# Patient Record
Sex: Female | Born: 1935
Health system: Southern US, Community
[De-identification: ages and names within clinical notes are randomized; demographics above are authoritative.]

## PROBLEM LIST (undated history)

## (undated) DIAGNOSIS — M1990 Unspecified osteoarthritis, unspecified site: Secondary | ICD-10-CM

## (undated) DIAGNOSIS — I480 Paroxysmal atrial fibrillation: Secondary | ICD-10-CM

## (undated) DIAGNOSIS — I10 Essential (primary) hypertension: Secondary | ICD-10-CM

## (undated) DIAGNOSIS — E785 Hyperlipidemia, unspecified: Secondary | ICD-10-CM

## (undated) DIAGNOSIS — J45909 Unspecified asthma, uncomplicated: Secondary | ICD-10-CM

## (undated) DIAGNOSIS — E119 Type 2 diabetes mellitus without complications: Secondary | ICD-10-CM

## (undated) DIAGNOSIS — J449 Chronic obstructive pulmonary disease, unspecified: Secondary | ICD-10-CM

## (undated) DIAGNOSIS — E782 Mixed hyperlipidemia: Secondary | ICD-10-CM

## (undated) DIAGNOSIS — N189 Chronic kidney disease, unspecified: Secondary | ICD-10-CM

## (undated) DIAGNOSIS — D649 Anemia, unspecified: Secondary | ICD-10-CM

## (undated) DIAGNOSIS — R011 Cardiac murmur, unspecified: Secondary | ICD-10-CM

## (undated) DIAGNOSIS — I350 Nonrheumatic aortic (valve) stenosis: Secondary | ICD-10-CM

## (undated) DIAGNOSIS — E7800 Pure hypercholesterolemia, unspecified: Secondary | ICD-10-CM

## (undated) HISTORY — PX: REPAIR KNEE LIGAMENT: SUR1188

## (undated) HISTORY — DX: Nonrheumatic aortic (valve) stenosis: I35.0

## (undated) HISTORY — DX: Paroxysmal atrial fibrillation: I48.0

## (undated) HISTORY — PX: BACK SURGERY: SHX140

## (undated) HISTORY — DX: Chronic kidney disease, unspecified: N18.9

## (undated) HISTORY — DX: Type 2 diabetes mellitus without complications: E11.9

## (undated) HISTORY — DX: Anemia, unspecified: D64.9

## (undated) HISTORY — PX: CYST REMOVAL HAND: SHX6279

## (undated) HISTORY — DX: Essential (primary) hypertension: I10

## (undated) HISTORY — DX: Mixed hyperlipidemia: E78.2

## (undated) HISTORY — PX: APPENDECTOMY: SHX54

---

## 2011-07-06 ENCOUNTER — Encounter (HOSPITAL_COMMUNITY): Payer: Self-pay

## 2011-07-06 ENCOUNTER — Emergency Department (HOSPITAL_COMMUNITY)
Admission: EM | Admit: 2011-07-06 | Discharge: 2011-07-06 | Disposition: A | Discharge status: Home / Self Care | Payer: Medicare Other | Attending: Emergency Medicine | Admitting: Emergency Medicine

## 2011-07-06 ENCOUNTER — Emergency Department (HOSPITAL_COMMUNITY): Payer: Medicare Other

## 2011-07-06 DIAGNOSIS — Z794 Long term (current) use of insulin: Secondary | ICD-10-CM | POA: Insufficient documentation

## 2011-07-06 DIAGNOSIS — R0602 Shortness of breath: Secondary | ICD-10-CM | POA: Insufficient documentation

## 2011-07-06 DIAGNOSIS — E119 Type 2 diabetes mellitus without complications: Secondary | ICD-10-CM | POA: Insufficient documentation

## 2011-07-06 DIAGNOSIS — Z79899 Other long term (current) drug therapy: Secondary | ICD-10-CM | POA: Insufficient documentation

## 2011-07-06 DIAGNOSIS — J45909 Unspecified asthma, uncomplicated: Secondary | ICD-10-CM | POA: Insufficient documentation

## 2011-07-06 DIAGNOSIS — R0789 Other chest pain: Secondary | ICD-10-CM | POA: Insufficient documentation

## 2011-07-06 DIAGNOSIS — I10 Essential (primary) hypertension: Secondary | ICD-10-CM | POA: Insufficient documentation

## 2011-07-06 HISTORY — DX: Unspecified asthma, uncomplicated: J45.909

## 2011-07-06 HISTORY — DX: Essential (primary) hypertension: I10

## 2011-07-06 LAB — DIFFERENTIAL
Basophils Absolute: 0 10*3/uL (ref 0.0–0.1)
Basophils Relative: 0 % (ref 0–1)
Eosinophils Absolute: 0 10*3/uL (ref 0.0–0.7)
Eosinophils Relative: 0 % (ref 0–5)
Lymphocytes Relative: 6 % — ABNORMAL LOW (ref 12–46)
Lymphs Abs: 0.7 10*3/uL (ref 0.7–4.0)
Monocytes Absolute: 0.3 10*3/uL (ref 0.1–1.0)
Monocytes Relative: 3 % (ref 3–12)
Neutro Abs: 10.5 10*3/uL — ABNORMAL HIGH (ref 1.7–7.7)
Neutrophils Relative %: 92 % — ABNORMAL HIGH (ref 43–77)

## 2011-07-06 LAB — CBC
HCT: 36 % (ref 36.0–46.0)
Hemoglobin: 11.3 g/dL — ABNORMAL LOW (ref 12.0–15.0)
MCH: 25.6 pg — ABNORMAL LOW (ref 26.0–34.0)
MCHC: 31.4 g/dL (ref 30.0–36.0)
MCV: 81.6 fL (ref 78.0–100.0)
Platelets: 258 10*3/uL (ref 150–400)
RBC: 4.41 MIL/uL (ref 3.87–5.11)
RDW: 15 % (ref 11.5–15.5)
WBC: 11.4 10*3/uL — ABNORMAL HIGH (ref 4.0–10.5)

## 2011-07-06 LAB — COMPREHENSIVE METABOLIC PANEL
ALT: 12 U/L (ref 0–35)
AST: 18 U/L (ref 0–37)
Albumin: 3.6 g/dL (ref 3.5–5.2)
Alkaline Phosphatase: 139 U/L — ABNORMAL HIGH (ref 39–117)
BUN: 24 mg/dL — ABNORMAL HIGH (ref 6–23)
CO2: 24 mEq/L (ref 19–32)
Calcium: 10.5 mg/dL (ref 8.4–10.5)
Chloride: 95 mEq/L — ABNORMAL LOW (ref 96–112)
Creatinine, Ser: 0.9 mg/dL (ref 0.50–1.10)
GFR calc Af Amer: 72 mL/min — ABNORMAL LOW (ref >90)
GFR calc non Af Amer: 62 mL/min — ABNORMAL LOW (ref >90)
Glucose, Bld: 292 mg/dL — ABNORMAL HIGH (ref 70–99)
Potassium: 5.4 mEq/L — ABNORMAL HIGH (ref 3.5–5.1)
Sodium: 131 mEq/L — ABNORMAL LOW (ref 135–145)
Total Bilirubin: 0.3 mg/dL (ref 0.3–1.2)
Total Protein: 7.4 g/dL (ref 6.0–8.3)

## 2011-07-06 LAB — PRO B NATRIURETIC PEPTIDE: Pro B Natriuretic peptide (BNP): 125.3 pg/mL — ABNORMAL HIGH (ref 0–125)

## 2011-07-06 MED ORDER — MOXIFLOXACIN HCL IN NACL 400 MG/250ML IV SOLN
400.0000 mg | Freq: Once | INTRAVENOUS | Status: AC
  Administered 2011-07-06: 400 mg via INTRAVENOUS
  Filled 2011-07-06: qty 250

## 2011-07-06 MED ORDER — DEXTROSE 5 % IV SOLN: 500.0000 mg | Freq: Once | INTRAVENOUS | Status: DC

## 2011-07-06 MED ORDER — SODIUM CHLORIDE 0.9 % IV SOLN
INTRAVENOUS | Status: DC
  Administered 2011-07-06: 19:00:00 via INTRAVENOUS

## 2011-07-06 MED ORDER — IPRATROPIUM BROMIDE 0.02 % IN SOLN
0.5000 mg | Freq: Once | RESPIRATORY_TRACT | Status: AC
  Administered 2011-07-06: 0.5 mg via RESPIRATORY_TRACT
  Filled 2011-07-06: qty 2.5

## 2011-07-06 MED ORDER — METHYLPREDNISOLONE SODIUM SUCC 125 MG IJ SOLR
125.0000 mg | Freq: Once | INTRAMUSCULAR | Status: AC
  Administered 2011-07-06: 125 mg via INTRAVENOUS
  Filled 2011-07-06: qty 2

## 2011-07-06 MED ORDER — DEXTROSE 5 % IV SOLN: 1.0000 g | Freq: Once | INTRAVENOUS | Status: DC

## 2011-07-06 MED ORDER — ALBUTEROL (5 MG/ML) CONTINUOUS INHALATION SOLN
10.0000 mg/h | INHALATION_SOLUTION | RESPIRATORY_TRACT | Status: AC
  Administered 2011-07-06: 10 mg/h via RESPIRATORY_TRACT
  Filled 2011-07-06: qty 20

## 2011-07-06 MED ORDER — LEVOFLOXACIN 500 MG PO TABS: 500.0000 mg | ORAL_TABLET | Freq: Every day | ORAL | Status: AC

## 2011-07-06 NOTE — ED Notes (Signed)
Pt c/o SOB since THursday, saw pcp yesterday.  Was started on prednisone and was given an antibiotic shot in the office.   Pt says is not feeling any better.  Denies fever.

## 2011-07-06 NOTE — ED Notes (Signed)
Ambulated pt around nurses station; O2 stayed between 90 and 92 while walking.

## 2011-07-06 NOTE — ED Provider Notes (Signed)
History     CSN: [id number]  Arrival date & time 07/06/11  1715   First MD Initiated Contact with Patient 07/06/11 1804      Chief Complaint  Patient presents with  . Shortness of Breath    (Consider location/radiation/quality/duration/timing/severity/associated sxs/prior treatment) HPI  Patient relates she has a history of asthma. She reports 5 days ago she started having cough with yellow sputum, and sometimes has a sore throat and sometimes has some rhinorrhea. She denies chest pain but states sometimes her chest feels tight. She relates she's having wheezing and only gets temporary relief with her inhaler. She denies nausea, vomiting, or diarrhea. She received by her family physician yesterday and got a shot and placed on prednisone and was told that she needed to be admitted. Patient relates she's not improving. She reports she has never been admitted to the hospital for asthma, her daughter does state she was admitted for pneumonia once many years ago in [place]. She has not been around anybody else is sick.  PCP Dr Ninfa Linden in [place]  Past Medical History  Diagnosis Date  . Asthma   . Diabetes mellitus   . Hypertension     History reviewed. No pertinent past surgical history.  No family history on file.  History  Substance Use Topics  . Smoking status: Former Games developer  . Smokeless tobacco: Not on file  . Alcohol Use: No  lives with granddaughter and her great grandchildren  OB History    Grav Para Term Preterm Abortions TAB SAB Ect Mult Living                  Review of Systems  All other systems reviewed and are negative.    Allergies  Amoxicillin and Penicillins  Home Medications   Current Outpatient Rx  Name Route Sig Dispense Refill  . ALBUTEROL SULFATE (2.5 MG/3ML) 0.083% IN NEBU Nebulization Take 2.5 mg by nebulization daily as needed.    . ASPIRIN 325 MG PO TABS Oral Take 325 mg by mouth daily.    Marland Kitchen GLIPIZIDE 5 MG PO TABS Oral Take  2.5 mg by mouth 2 (two) times daily. Take at breakfast and supper    . INSULIN GLARGINE 100 UNIT/ML [place] SOLN Subcutaneous Inject 40 Units into the skin at bedtime.    Marland Kitchen LISINOPRIL 20 MG PO TABS Oral Take 40 mg by mouth daily.    Marland Kitchen METFORMIN HCL 1000 MG PO TABS Oral Take 1,000 mg by mouth 2 (two) times daily.    Marland Kitchen PREDNISONE 20 MG PO TABS Oral Take 20 mg by mouth daily. Take three tablets (60mg ) daily for 3 days, then take two tablets (40mg ) daily for 3 days, then take one tablet (20mg ) daily for 3 days.    Marland Kitchen SIMVASTATIN 80 MG PO TABS Oral Take 80 mg by mouth at bedtime.    . TRIAMTERENE-HCTZ 37.5-25 MG PO TABS Oral Take 1 tablet by mouth daily.      BP 121/60  Pulse 88  Temp 99 F (37.2 C) (Oral)  Resp 24  Ht 5\' 6"  (1.676 m)  Wt 213 lb (96.616 kg)  BMI 34.38 kg/m2  SpO2 94%  Vital signs normal    Physical Exam  Nursing note and vitals reviewed. Constitutional: She is oriented to person, place, and time. She appears well-developed and well-nourished.  Non-toxic appearance. She does not appear ill. No distress.  HENT:  Head: Normocephalic and atraumatic.  Right Ear: External ear normal.  Left Ear:  External ear normal.  Nose: Nose normal. No mucosal edema or rhinorrhea.  Mouth/Throat: Oropharynx is clear and moist and mucous membranes are normal. No dental abscesses or uvula swelling.  Eyes: Conjunctivae and EOM are normal. Pupils are equal, round, and reactive to light.  Neck: Normal range of motion and full passive range of motion without pain. Neck supple.  Cardiovascular: Normal rate, regular rhythm and normal heart sounds.  Exam reveals no gallop and no friction rub.   No murmur heard. Pulmonary/Chest: Effort normal. No respiratory distress. She has wheezes. She has no rhonchi. She has no rales. She exhibits no tenderness and no crepitus.       Patient has diffuse expiratory wheezes in all lung fields, she has mild retractions, she appears to be tachypneic when she talks    Abdominal: Soft. Normal appearance and bowel sounds are normal. She exhibits no distension. There is no tenderness. There is no rebound and no guarding.  Musculoskeletal: Normal range of motion. She exhibits no edema and no tenderness.       Moves all extremities well.   Neurological: She is alert and oriented to person, place, and time. She has normal strength. No cranial nerve deficit.  Skin: Skin is warm, dry and intact. No rash noted. No erythema. No pallor.  Psychiatric: She has a normal mood and affect. Her speech is normal and behavior is normal. Her mood appears not anxious.    ED Course  Procedures (including critical care time)   Medications  0.9 %  sodium chloride infusion (  Intravenous New Bag/Given 07/06/11 1917)  albuterol (PROVENTIL,VENTOLIN) solution continuous neb (0 mg/hr Nebulization Stopped 07/06/11 2140)  ipratropium (ATROVENT) nebulizer solution 0.5 mg (0.5 mg Nebulization Given 07/06/11 2008)  methylPREDNISolone sodium succinate (SOLU-MEDROL) 125 mg/2 mL injection 125 mg (125 mg Intravenous Given 07/06/11 1918)  moxifloxacin (AVELOX) IVPB 400 mg (0 mg Intravenous Stopped 07/06/11 2030)  Albuterol continuous nebulizer  Recheck after nebulizer finished, pt states she is feeling better. Her lungs are now clear. Her pulse ox was 93% on RA.   Pt ambulated by nursing staff and her pulse ox was 90-92 % on RA.  22:36 Pt states she feels fine after her walk, feels ready to go home.    Results for orders placed during the hospital encounter of 07/06/11  CBC      Component Value Range   WBC 11.4 (*) 4.0 - 10.5 K/uL   RBC 4.41  3.87 - 5.11 MIL/uL   Hemoglobin 11.3 (*) 12.0 - 15.0 g/dL   HCT 95.2  84.1 - 32.4 %   MCV 81.6  78.0 - 100.0 fL   MCH 25.6 (*) 26.0 - 34.0 pg   MCHC 31.4  30.0 - 36.0 g/dL   RDW 40.1  02.7 - 25.3 %   Platelets 258  150 - 400 K/uL  DIFFERENTIAL      Component Value Range   Neutrophils Relative 92 (*) 43 - 77 %   Neutro Abs 10.5 (*) 1.7 - 7.7  K/uL   Lymphocytes Relative 6 (*) 12 - 46 %   Lymphs Abs 0.7  0.7 - 4.0 K/uL   Monocytes Relative 3  3 - 12 %   Monocytes Absolute 0.3  0.1 - 1.0 K/uL   Eosinophils Relative 0  0 - 5 %   Eosinophils Absolute 0.0  0.0 - 0.7 K/uL   Basophils Relative 0  0 - 1 %   Basophils Absolute 0.0  0.0 - 0.1 K/uL  COMPREHENSIVE METABOLIC PANEL      Component Value Range   Sodium 131 (*) 135 - 145 mEq/L   Potassium 5.4 (*) 3.5 - 5.1 mEq/L   Chloride 95 (*) 96 - 112 mEq/L   CO2 24  19 - 32 mEq/L   Glucose, Bld 292 (*) 70 - 99 mg/dL   BUN 24 (*) 6 - 23 mg/dL   Creatinine, Ser 4.09  0.50 - 1.10 mg/dL   Calcium 81.1  8.4 - 91.4 mg/dL   Total Protein 7.4  6.0 - 8.3 g/dL   Albumin 3.6  3.5 - 5.2 g/dL   AST 18  0 - 37 U/L   ALT 12  0 - 35 U/L   Alkaline Phosphatase 139 (*) 39 - 117 U/L   Total Bilirubin 0.3  0.3 - 1.2 mg/dL   GFR calc non Af Amer 62 (*) >90 mL/min   GFR calc Af Amer 72 (*) >90 mL/min  PRO B NATRIURETIC PEPTIDE      Component Value Range   Pro B Natriuretic peptide (BNP) 125.3 (*) 0 - 125 pg/mL   Laboratory interpretation all normal except hyperglycemia, renal insufficiency, mild anemia, mild leukocytosis, mild hyponatremia   Dg Chest 2 View  07/06/2011  *RADIOLOGY REPORT*  Clinical Data: Shortness of breath  CHEST - 2 VIEW  Comparison: None.  Findings: Borderline cardiomegaly noted.  No acute infiltrate or pulmonary edema.  There is a pleural based ovoid nodular density in the right upper lobe laterally.  Further evaluation with CT scan of the chest with IV contrast is recommended.  IMPRESSION: Borderline cardiomegaly.There is a pleural based ovoid nodular density in the right upper lobe laterally.  Further evaluation with CT scan of the chest with IV contrast is recommended.  Original Report Authenticated By: Natasha Mead, M.D.     1. Asthma with bronchitis    New Prescriptions   LEVOFLOXACIN (LEVAQUIN) 500 MG TABLET    Take 1 tablet (500 mg total) by mouth daily.    Plan  discharge  Devoria Albe, MD, FACEP    MDM          Ward Givens, MD 07/06/11 2236

## 2011-07-06 NOTE — Discharge Instructions (Signed)
Drink plenty of fluids. Use your nebulizer or inhaler every 4 hrs as needed for wheezing or shortness of breath. Finish the prednisone. Start the antibiotic tomorrow afternoon (you got the first dose in the ED tonight).  Return if you get a high fever, struggle to breathe or feel worse.

## 2012-02-15 ENCOUNTER — Encounter: Payer: Self-pay | Admitting: Orthopedic Surgery

## 2012-02-15 ENCOUNTER — Ambulatory Visit (INDEPENDENT_AMBULATORY_CARE_PROVIDER_SITE_OTHER): Payer: Medicare Other | Admitting: Orthopedic Surgery

## 2012-02-15 ENCOUNTER — Ambulatory Visit (INDEPENDENT_AMBULATORY_CARE_PROVIDER_SITE_OTHER): Payer: Medicare Other

## 2012-02-15 VITALS — BP 130/68 | Ht 67.0 in | Wt 207.2 lb

## 2012-02-15 DIAGNOSIS — M25569 Pain in unspecified knee: Secondary | ICD-10-CM

## 2012-02-15 NOTE — Progress Notes (Signed)
Patient ID: Stephanie [name], female   DOB: [birth year], 77 y.o.   MRN: [id number] Chief Complaint  Patient presents with  . Knee Pain    Right knee pain no injury      This patient is from [place] referred to [place]. She complains of pain in her RIGHT knee since December of 2013 with no acute trauma although she notices were noticed that when she was walking in the supermarket she felt medial knee pain and a pop and since that, time. She's had 8/10 sharp throbbing intermittent pain, worse with standing for long periods and walking. She also has catching and locking and swelling of the knee.  She complains of a cough at times, itching muscle pain, stiffness, joint pain. Review of systems otherwise normal. She has hypertension and diabetes.  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  Physical Exam  Nursing note and vitals reviewed. Constitutional: She is oriented to person, place, and time. She appears well-developed and well-nourished. No distress.  HENT:  Head: Normocephalic.  Eyes: Pupils are equal, round, and reactive to light.  Neck: Normal range of motion.  Cardiovascular: Normal rate and intact distal pulses.   Pulmonary/Chest: Effort normal.  Abdominal: Soft.  Musculoskeletal:       Right knee: She exhibits abnormal alignment, bony tenderness and abnormal meniscus. She exhibits normal range of motion, no swelling, no effusion, no ecchymosis, no deformity, no laceration, no LCL laxity, normal patellar mobility and no MCL laxity. tenderness found. Medial joint line tenderness noted. No lateral joint line, no MCL, no LCL and no patellar tendon tenderness noted.       Left knee: Normal.       Upper extremity exam  The right and left upper extremity:  Inspection and palpation revealed no abnormalities in the upper extremities.  Range of motion is full without contracture. Motor exam is normal with grade 5 strength. The joints are fully reduced without subluxation. There is no atrophy or  tremor and muscle tone is normal.  All joints are stable.      Lymphadenopathy:    She has no cervical adenopathy.  Neurological: She is alert and oriented to person, place, and time. She has normal reflexes.  Skin: Skin is warm and dry. No rash noted. She is not diaphoretic. No erythema. No pallor.  Psychiatric: She has a normal mood and affect. Her behavior is normal. Judgment and thought content normal.   X-ray was obtained the patient is mild to moderate degenerative arthritis  Her clinical exam suggests she has a medial meniscal tear  We've recommended that she have meniscectomy for meniscal tear  She agrees that she has have something done, so will proceed with arthroscopy right knee partial medial meniscectomy

## 2012-02-15 NOTE — Patient Instructions (Addendum)
Surgery right knee: FEB 10TH   Arthroscopic Procedure, Knee An arthroscopic procedure can find what is wrong with your knee. PROCEDURE Arthroscopy is a surgical technique that allows your orthopedic surgeon to diagnose and treat your knee injury with accuracy. They will look into your knee through a small instrument. This is almost like a small (pencil sized) telescope. Because arthroscopy affects your knee less than open knee surgery, you can anticipate a more rapid recovery. Taking an active role by following your caregiver's instructions will help with rapid and complete recovery. Use crutches, rest, elevation, ice, and knee exercises as instructed. The length of recovery depends on various factors including type of injury, age, physical condition, medical conditions, and your rehabilitation. Your knee is the joint between the large bones (femur and tibia) in your leg. Cartilage covers these bone ends which are smooth and slippery and allow your knee to bend and move smoothly. Two menisci, thick, semi-lunar shaped pads of cartilage which form a rim inside the joint, help absorb shock and stabilize your knee. Ligaments bind the bones together and support your knee joint. Muscles move the joint, help support your knee, and take stress off the joint itself. Because of this all programs and physical therapy to rehabilitate an injured or repaired knee require rebuilding and strengthening your muscles. AFTER THE PROCEDURE  After the procedure, you will be moved to a recovery area until most of the effects of the medication have worn off. Your caregiver will discuss the test results with you.   Only take over-the-counter or prescription medicines for pain, discomfort, or fever as directed by your caregiver.    You have been scheduled for arthroscocpic knee surgery.  All surgeries carry some risk.  Remember you always have the option of continued nonsurgical treatment. However in this situation the risks  vs. the benefits favor surgery as the best treatment option. The risks of the surgery includes the following but is not limited to bleeding, infection, pulmonary embolus, death from anesthesia, nerve injury vascular injury or need for further surgery, continued pain.  Specific to this procedure the following risks and complications are rare but possible Stiffness, pain, weakness, giving out  I expect  recovery will be in 3-4 weeks some patients take 6 weeks.  You  will need physical therapy after the procedure  Stop any blood thinning medication: such as warfarin, coumadin, naprosyn, ibuprofen, advil, diclofenac, aspirin

## 2012-02-16 ENCOUNTER — Encounter (HOSPITAL_COMMUNITY): Payer: Self-pay | Admitting: Pharmacy Technician

## 2012-02-21 ENCOUNTER — Telehealth: Payer: Self-pay | Admitting: Orthopedic Surgery

## 2012-02-21 NOTE — Telephone Encounter (Signed)
Regarding Out-patient surgery scheduled  02/28/12, [facility], [place] [address],  [phone], No pre-authorization required. Per [employer] at ph# [phone], no pre-authorization required per [employer] provided all are in network (which all are.)   Her name and today's date for reference.

## 2012-02-22 ENCOUNTER — Encounter (HOSPITAL_COMMUNITY)
Admission: RE | Admit: 2012-02-22 | Discharge: 2012-02-22 | Disposition: A | Discharge status: Home / Self Care | Payer: Medicare Other | Source: Ambulatory Visit | Attending: Orthopedic Surgery | Admitting: Orthopedic Surgery

## 2012-02-22 ENCOUNTER — Encounter (HOSPITAL_COMMUNITY): Payer: Self-pay

## 2012-02-22 LAB — HEMOGLOBIN AND HEMATOCRIT, BLOOD
HCT: 37.3 % (ref 36.0–46.0)
Hemoglobin: 11.8 g/dL — ABNORMAL LOW (ref 12.0–15.0)

## 2012-02-22 LAB — BASIC METABOLIC PANEL
BUN: 19 mg/dL (ref 6–23)
CO2: 28 mEq/L (ref 19–32)
Calcium: 9.8 mg/dL (ref 8.4–10.5)
Chloride: 99 mEq/L (ref 96–112)
Creatinine, Ser: 1.07 mg/dL (ref 0.50–1.10)
GFR calc Af Amer: 58 mL/min — ABNORMAL LOW (ref >90)
GFR calc non Af Amer: 50 mL/min — ABNORMAL LOW (ref >90)
Glucose, Bld: 267 mg/dL — ABNORMAL HIGH (ref 70–99)
Potassium: 4.8 mEq/L (ref 3.5–5.1)
Sodium: 135 mEq/L (ref 135–145)

## 2012-02-22 LAB — SURGICAL PCR SCREEN
MRSA, PCR: POSITIVE — AB
Staphylococcus aureus: POSITIVE — AB

## 2012-02-22 NOTE — Patient Instructions (Addendum)
DANALY BARI  02/22/2012   Your procedure is scheduled on:  02/28/2012  Report to [facility] at  615  AM.  Call this number if you have problems the morning of surgery: [phone]   Remember:   Do not eat food or drink liquids after midnight.   Take these medicines the morning of surgery with A SIP OF WATER: norco,hydroxyzine,lisinopril,deltazone,maxzide   Do not wear jewelry, make-up or nail polish.  Do not wear lotions, powders, or perfumes.   Do not shave 48 hours prior to surgery. Men may shave face and neck.  Do not bring valuables to the hospital.  Contacts, dentures or bridgework may not be worn into surgery.  Leave suitcase in the car. After surgery it may be brought to your room.  For patients admitted to the hospital, checkout time is 11:00 AM the day of discharge.   Patients discharged the day of surgery will not be allowed to drive  home.  Name and phone number of your driver: family  Special Instructions: N/A   Please read over the following fact sheets that you were given: Pain Booklet, Coughing and Deep Breathing, Surgical Site Infection Prevention, Anesthesia Post-op Instructions and Care and Recovery After Surgery Arthroscopic Procedure, Knee An arthroscopic procedure can find what is wrong with your knee. PROCEDURE Arthroscopy is a surgical technique that allows your orthopedic surgeon to diagnose and treat your knee injury with accuracy. They will look into your knee through a small instrument. This is almost like a small (pencil sized) telescope. Because arthroscopy affects your knee less than open knee surgery, you can anticipate a more rapid recovery. Taking an active role by following your caregiver's instructions will help with rapid and complete recovery. Use crutches, rest, elevation, ice, and knee exercises as instructed. The length of recovery depends on various factors including type of injury, age, physical condition, medical conditions, and your  rehabilitation. Your knee is the joint between the large bones (femur and tibia) in your leg. Cartilage covers these bone ends which are smooth and slippery and allow your knee to bend and move smoothly. Two menisci, thick, semi-lunar shaped pads of cartilage which form a rim inside the joint, help absorb shock and stabilize your knee. Ligaments bind the bones together and support your knee joint. Muscles move the joint, help support your knee, and take stress off the joint itself. Because of this all programs and physical therapy to rehabilitate an injured or repaired knee require rebuilding and strengthening your muscles. AFTER THE PROCEDURE  After the procedure, you will be moved to a recovery area until most of the effects of the medication have worn off. Your caregiver will discuss the test results with you.  Only take over-the-counter or prescription medicines for pain, discomfort, or fever as directed by your caregiver. SEEK MEDICAL CARE IF:   You have increased bleeding from your wounds.  You see redness, swelling, or have increasing pain in your wounds.  You have pus coming from your wound.  You have an oral temperature above 102 F (38.9 C).  You notice a bad smell coming from the wound or dressing.  You have severe pain with any motion of your knee. SEEK IMMEDIATE MEDICAL CARE IF:   You develop a rash.  You have difficulty breathing.  You have any allergic problems. Document Released: 01/02/2000 Document Revised: 03/29/2011 Document Reviewed: 07/26/2007 [facility] Patient Information 2013 [place], [place]. PATIENT INSTRUCTIONS POST-ANESTHESIA  IMMEDIATELY FOLLOWING SURGERY:  Do not  drive or operate machinery for the first twenty four hours after surgery.  Do not make any important decisions for twenty four hours after surgery or while taking narcotic pain medications or sedatives.  If you develop intractable nausea and vomiting or a severe headache please notify your doctor  immediately.  FOLLOW-UP:  Please make an appointment with your surgeon as instructed. You do not need to follow up with anesthesia unless specifically instructed to do so.  WOUND CARE INSTRUCTIONS (if applicable):  Keep a dry clean dressing on the anesthesia/puncture wound site if there is drainage.  Once the wound has quit draining you may leave it open to air.  Generally you should leave the bandage intact for twenty four hours unless there is drainage.  If the epidural site drains for more than 36-48 hours please call the anesthesia department.  QUESTIONS?:  Please feel free to call your physician or the hospital operator if you have any questions, and they will be happy to assist you.

## 2012-02-24 MED ORDER — MIDAZOLAM HCL 2 MG/2ML IJ SOLN
INTRAMUSCULAR | Status: AC
  Filled 2012-02-24: qty 2

## 2012-02-24 NOTE — H&P (Signed)
Chief Complaint    Patient presents with    .  Knee Pain      Right knee pain no injury    This patient is from [place] referred to [place]. She complains of pain in her RIGHT knee since December of 2013 with no acute trauma although she notices were noticed that when she was walking in the supermarket she felt medial knee pain and a pop and since that, time. She's had 8/10 sharp throbbing intermittent pain, worse with standing for long periods and walking. She also has catching and locking and swelling of the knee.  She complains of a cough at times, itching muscle pain, stiffness, joint pain. Review of systems otherwise normal. She has hypertension and diabetes.  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   Past Medical History  Diagnosis Date  . Asthma   . Diabetes mellitus   . Hypertension    Past Surgical History  Procedure Date  . Cyst removal hand     Right, hospital in [place]   Family History  Problem Relation Age of Onset  . Lung disease    . Cancer    . Arthritis    . Asthma    . Diabetes     History   Social History  . Marital Status: Divorced    Spouse Name: N/A    Number of Children: N/A  . Years of Education: N/A   Occupational History  . Not on file.   Social History Main Topics  . Smoking status: Former Smoker    Types: Cigarettes  . Smokeless tobacco: Not on file  . Alcohol Use: No  . Drug Use: No  . Sexually Active:    Other Topics Concern  . Not on file   Social History Narrative  . No narrative on file    Physical Exam  Nursing note and vitals reviewed.  Constitutional: She is oriented to person, place, and time. She appears well-developed and well-nourished. No distress.  HENT:  Head: Normocephalic.  Eyes: Pupils are equal, round, and reactive to light.  Neck: Normal range of motion.  Cardiovascular: Normal rate and intact distal pulses.  Pulmonary/Chest: Effort normal.  Abdominal: Soft.  Musculoskeletal:  Right knee: She exhibits abnormal  alignment, bony tenderness and abnormal meniscus. She exhibits normal range of motion, no swelling, no effusion, no ecchymosis, no deformity, no laceration, no LCL laxity, normal patellar mobility and no MCL laxity. tenderness found. Medial joint line tenderness noted. No lateral joint line, no MCL, no LCL and no patellar tendon tenderness noted.  Left knee: Normal.  Upper extremity exam  The right and left upper extremity:  Inspection and palpation revealed no abnormalities in the upper extremities.  Range of motion is full without contracture. Motor exam is normal with grade 5 strength. The joints are fully reduced without subluxation. There is no atrophy or tremor and muscle tone is normal. All joints are stable.     Lymphadenopathy:  She has no cervical adenopathy.  Neurological: She is alert and oriented to person, place, and time. She has normal reflexes.  Skin: Skin is warm and dry. No rash noted. She is not diaphoretic. No erythema. No pallor.  Psychiatric: She has a normal mood and affect. Her behavior is normal. Judgment and thought content normal.  X-ray was obtained the patient is mild to moderate degenerative arthritis  Her clinical exam suggests she has a medial meniscal tear  We've recommended that she have meniscectomy for meniscal tear  She  agrees that she has have something done, so will proceed with arthroscopy right knee partial medial meniscectomy

## 2012-02-28 ENCOUNTER — Encounter (HOSPITAL_COMMUNITY): Payer: Self-pay | Admitting: *Deleted

## 2012-02-28 ENCOUNTER — Ambulatory Visit (HOSPITAL_COMMUNITY)
Admission: RE | Admit: 2012-02-28 | Discharge: 2012-02-28 | Disposition: A | Discharge status: Home / Self Care | Payer: Medicare Other | Source: Ambulatory Visit | Attending: Orthopedic Surgery | Admitting: Orthopedic Surgery

## 2012-02-28 ENCOUNTER — Ambulatory Visit (HOSPITAL_COMMUNITY): Payer: Medicare Other | Admitting: Anesthesiology

## 2012-02-28 ENCOUNTER — Encounter (HOSPITAL_COMMUNITY): Payer: Self-pay | Admitting: Anesthesiology

## 2012-02-28 ENCOUNTER — Encounter (HOSPITAL_COMMUNITY)
Admission: RE | Disposition: A | Discharge status: Home / Self Care | Payer: Self-pay | Source: Ambulatory Visit | Attending: Orthopedic Surgery

## 2012-02-28 DIAGNOSIS — Z01812 Encounter for preprocedural laboratory examination: Secondary | ICD-10-CM | POA: Insufficient documentation

## 2012-02-28 DIAGNOSIS — E119 Type 2 diabetes mellitus without complications: Secondary | ICD-10-CM | POA: Insufficient documentation

## 2012-02-28 DIAGNOSIS — M23305 Other meniscus derangements, unspecified medial meniscus, unspecified knee: Secondary | ICD-10-CM | POA: Insufficient documentation

## 2012-02-28 DIAGNOSIS — M23329 Other meniscus derangements, posterior horn of medial meniscus, unspecified knee: Secondary | ICD-10-CM

## 2012-02-28 DIAGNOSIS — Z01810 Encounter for preprocedural cardiovascular examination: Secondary | ICD-10-CM | POA: Insufficient documentation

## 2012-02-28 DIAGNOSIS — I10 Essential (primary) hypertension: Secondary | ICD-10-CM | POA: Insufficient documentation

## 2012-02-28 DIAGNOSIS — M25569 Pain in unspecified knee: Secondary | ICD-10-CM

## 2012-02-28 HISTORY — PX: KNEE ARTHROSCOPY WITH MEDIAL MENISECTOMY: SHX5651

## 2012-02-28 LAB — GLUCOSE, CAPILLARY: Glucose-Capillary: 233 mg/dL — ABNORMAL HIGH (ref 70–99)

## 2012-02-28 SURGERY — ARTHROSCOPY, KNEE, WITH MEDIAL MENISCECTOMY
Anesthesia: General | Site: Knee | Laterality: Right | Wound class: Clean

## 2012-02-28 MED ORDER — FENTANYL CITRATE 0.05 MG/ML IJ SOLN
INTRAMUSCULAR | Status: AC
  Filled 2012-02-28: qty 2

## 2012-02-28 MED ORDER — GLYCOPYRROLATE 0.2 MG/ML IJ SOLN
0.2000 mg | Freq: Once | INTRAMUSCULAR | Status: AC
  Administered 2012-02-28: 0.2 mg via INTRAVENOUS

## 2012-02-28 MED ORDER — HYDROCODONE-ACETAMINOPHEN 5-325 MG PO TABS
1.0000 | ORAL_TABLET | Freq: Once | ORAL | Status: AC
  Administered 2012-02-28: 1 via ORAL

## 2012-02-28 MED ORDER — BUPIVACAINE-EPINEPHRINE PF 0.5-1:200000 % IJ SOLN
INTRAMUSCULAR | Status: AC
  Filled 2012-02-28: qty 20

## 2012-02-28 MED ORDER — EPINEPHRINE HCL 1 MG/ML IJ SOLN
INTRAMUSCULAR | Status: AC
  Filled 2012-02-28: qty 3

## 2012-02-28 MED ORDER — PROPOFOL 10 MG/ML IV EMUL
INTRAVENOUS | Status: AC
  Filled 2012-02-28: qty 20

## 2012-02-28 MED ORDER — PROMETHAZINE HCL 12.5 MG PO TABS: 12.5000 mg | ORAL_TABLET | Freq: Four times a day (QID) | ORAL | Status: DC | PRN

## 2012-02-28 MED ORDER — ONDANSETRON HCL 4 MG/2ML IJ SOLN
4.0000 mg | Freq: Once | INTRAMUSCULAR | Status: AC
  Administered 2012-02-28: 4 mg via INTRAVENOUS

## 2012-02-28 MED ORDER — MIDAZOLAM HCL 2 MG/2ML IJ SOLN
1.0000 mg | INTRAMUSCULAR | Status: DC | PRN
  Administered 2012-02-28: 2 mg via INTRAVENOUS

## 2012-02-28 MED ORDER — VANCOMYCIN HCL IN DEXTROSE 1-5 GM/200ML-% IV SOLN
INTRAVENOUS | Status: AC
  Filled 2012-02-28: qty 200

## 2012-02-28 MED ORDER — HYDROCODONE-ACETAMINOPHEN 5-325 MG PO TABS
ORAL_TABLET | ORAL | Status: AC
  Filled 2012-02-28: qty 1

## 2012-02-28 MED ORDER — GLYCOPYRROLATE 0.2 MG/ML IJ SOLN
INTRAMUSCULAR | Status: AC
  Filled 2012-02-28: qty 1

## 2012-02-28 MED ORDER — KETOROLAC TROMETHAMINE 30 MG/ML IJ SOLN
INTRAMUSCULAR | Status: AC
  Filled 2012-02-28: qty 1

## 2012-02-28 MED ORDER — VANCOMYCIN HCL IN DEXTROSE 1-5 GM/200ML-% IV SOLN
1000.0000 mg | INTRAVENOUS | Status: AC
  Administered 2012-02-28: 1000 mg via INTRAVENOUS

## 2012-02-28 MED ORDER — LACTATED RINGERS IV SOLN
INTRAVENOUS | Status: DC | PRN
  Administered 2012-02-28: 07:00:00 via INTRAVENOUS

## 2012-02-28 MED ORDER — LIDOCAINE HCL (PF) 1 % IJ SOLN
INTRAMUSCULAR | Status: AC
  Filled 2012-02-28: qty 5

## 2012-02-28 MED ORDER — HYDROCODONE-ACETAMINOPHEN 7.5-325 MG PO TABS: 1.0000 | ORAL_TABLET | ORAL | Status: DC | PRN

## 2012-02-28 MED ORDER — PROPOFOL 10 MG/ML IV EMUL
INTRAVENOUS | Status: DC | PRN
  Administered 2012-02-28: 180 mg via INTRAVENOUS

## 2012-02-28 MED ORDER — SODIUM CHLORIDE 0.9 % IR SOLN
Status: DC | PRN
  Administered 2012-02-28 (×2)

## 2012-02-28 MED ORDER — ONDANSETRON HCL 4 MG/2ML IJ SOLN
INTRAMUSCULAR | Status: AC
  Filled 2012-02-28: qty 2

## 2012-02-28 MED ORDER — MIDAZOLAM HCL 2 MG/2ML IJ SOLN
INTRAMUSCULAR | Status: AC
  Filled 2012-02-28: qty 2

## 2012-02-28 MED ORDER — KETOROLAC TROMETHAMINE 30 MG/ML IJ SOLN
30.0000 mg | Freq: Once | INTRAMUSCULAR | Status: AC
  Administered 2012-02-28: 30 mg via INTRAVENOUS

## 2012-02-28 MED ORDER — MIDAZOLAM HCL 5 MG/5ML IJ SOLN
INTRAMUSCULAR | Status: DC | PRN
  Administered 2012-02-28 (×2): 1 mg via INTRAVENOUS

## 2012-02-28 MED ORDER — ACETAMINOPHEN 10 MG/ML IV SOLN
INTRAVENOUS | Status: AC
  Filled 2012-02-28: qty 100

## 2012-02-28 MED ORDER — BUPIVACAINE-EPINEPHRINE PF 0.5-1:200000 % IJ SOLN
INTRAMUSCULAR | Status: DC | PRN
  Administered 2012-02-28: 60 mL

## 2012-02-28 MED ORDER — LIDOCAINE HCL (CARDIAC) 10 MG/ML IV SOLN
INTRAVENOUS | Status: DC | PRN
  Administered 2012-02-28: 50 mg via INTRAVENOUS

## 2012-02-28 MED ORDER — FENTANYL CITRATE 0.05 MG/ML IJ SOLN
INTRAMUSCULAR | Status: DC | PRN
  Administered 2012-02-28: 25 ug via INTRAVENOUS
  Administered 2012-02-28 (×2): 50 ug via INTRAVENOUS

## 2012-02-28 MED ORDER — SODIUM CHLORIDE 0.9 % IR SOLN
Status: DC | PRN
  Administered 2012-02-28: 1000 mL

## 2012-02-28 MED ORDER — VANCOMYCIN HCL 1000 MG IV SOLR
1000.0000 mg | INTRAVENOUS | Status: DC | PRN
  Administered 2012-02-28: 1000 mg via INTRAVENOUS

## 2012-02-28 MED ORDER — ACETAMINOPHEN 10 MG/ML IV SOLN
1000.0000 mg | Freq: Once | INTRAVENOUS | Status: AC
  Administered 2012-02-28: 1000 mg via INTRAVENOUS

## 2012-02-28 MED ORDER — LACTATED RINGERS IV SOLN
INTRAVENOUS | Status: DC
  Administered 2012-02-28: 07:00:00 via INTRAVENOUS

## 2012-02-28 SURGICAL SUPPLY — 48 items
ARTHROWAND PARAGON T2 (SURGICAL WAND)
BAG HAMPER (MISCELLANEOUS) ×2 IMPLANT
BANDAGE ELASTIC 6 VELCRO NS (GAUZE/BANDAGES/DRESSINGS) ×2 IMPLANT
BLADE AGGRESSIVE PLUS 4.0 (BLADE) ×2 IMPLANT
BLADE SURG SZ11 CARB STEEL (BLADE) ×2 IMPLANT
CHLORAPREP W/TINT 26ML (MISCELLANEOUS) ×4 IMPLANT
CLOTH BEACON ORANGE TIMEOUT ST (SAFETY) ×2 IMPLANT
COOLER CRYO IC GRAV AND TUBE (ORTHOPEDIC SUPPLIES) ×2 IMPLANT
CUFF CRYO KNEE LG 20X31 COOLER (ORTHOPEDIC SUPPLIES) IMPLANT
CUFF CRYO KNEE18X23 MED (MISCELLANEOUS) ×2 IMPLANT
CUFF TOURNIQUET SINGLE 34IN LL (TOURNIQUET CUFF) ×2 IMPLANT
CUFF TOURNIQUET SINGLE 44IN (TOURNIQUET CUFF) IMPLANT
CUTTER ANGLED DBL BITE 4.5 (BURR) IMPLANT
DECANTER SPIKE VIAL GLASS SM (MISCELLANEOUS) ×4 IMPLANT
GAUZE SPONGE 4X4 16PLY XRAY LF (GAUZE/BANDAGES/DRESSINGS) ×2 IMPLANT
GAUZE XEROFORM 5X9 LF (GAUZE/BANDAGES/DRESSINGS) ×2 IMPLANT
GLOVE ECLIPSE 6.5 STRL STRAW (GLOVE) ×2 IMPLANT
GLOVE INDICATOR 7.0 STRL GRN (GLOVE) ×4 IMPLANT
GLOVE SKINSENSE NS SZ8.0 LF (GLOVE) ×1
GLOVE SKINSENSE STRL SZ8.0 LF (GLOVE) ×1 IMPLANT
GLOVE SS N UNI LF 8.5 STRL (GLOVE) ×2 IMPLANT
GOWN STRL REIN XL XLG (GOWN DISPOSABLE) ×6 IMPLANT
HLDR LEG FOAM (MISCELLANEOUS) ×1 IMPLANT
IV NS IRRIG 3000ML ARTHROMATIC (IV SOLUTION) ×6 IMPLANT
KIT BLADEGUARD II DBL (SET/KITS/TRAYS/PACK) ×2 IMPLANT
KIT ROOM TURNOVER AP CYSTO (KITS) ×2 IMPLANT
LEG HOLDER FOAM (MISCELLANEOUS) ×1
MANIFOLD NEPTUNE II (INSTRUMENTS) ×2 IMPLANT
MARKER SKIN DUAL TIP RULER LAB (MISCELLANEOUS) ×2 IMPLANT
NEEDLE HYPO 18GX1.5 BLUNT FILL (NEEDLE) ×2 IMPLANT
NEEDLE HYPO 21X1.5 SAFETY (NEEDLE) ×2 IMPLANT
NEEDLE SPNL 18GX3.5 QUINCKE PK (NEEDLE) ×2 IMPLANT
NS IRRIG 1000ML POUR BTL (IV SOLUTION) ×2 IMPLANT
PACK ARTHRO LIMB DRAPE STRL (MISCELLANEOUS) ×2 IMPLANT
PAD ABD 5X9 TENDERSORB (GAUZE/BANDAGES/DRESSINGS) ×2 IMPLANT
PAD ARMBOARD 7.5X6 YLW CONV (MISCELLANEOUS) ×2 IMPLANT
PADDING CAST COTTON 6X4 STRL (CAST SUPPLIES) ×2 IMPLANT
SET ARTHROSCOPY INST (INSTRUMENTS) ×2 IMPLANT
SET ARTHROSCOPY PUMP TUBE (IRRIGATION / IRRIGATOR) ×2 IMPLANT
SET BASIN LINEN APH (SET/KITS/TRAYS/PACK) ×2 IMPLANT
SPONGE GAUZE 4X4 12PLY (GAUZE/BANDAGES/DRESSINGS) ×2 IMPLANT
SUT ETHILON 3 0 FSL (SUTURE) ×2 IMPLANT
SYR 30ML LL (SYRINGE) ×2 IMPLANT
SYRINGE 10CC LL (SYRINGE) ×2 IMPLANT
WAND 50 DEG COVAC W/CORD (SURGICAL WAND) ×2 IMPLANT
WAND 90 DEG TURBOVAC W/CORD (SURGICAL WAND) IMPLANT
WAND ARTHRO PARAGON T2 (SURGICAL WAND) IMPLANT
YANKAUER SUCT BULB TIP 10FT TU (MISCELLANEOUS) ×8 IMPLANT

## 2012-02-28 NOTE — Op Note (Signed)
02/28/2012  8:31 AM  PATIENT:  Stephanie [name]  77 y.o. 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 Procedure was confirmed and chart update was completed  The patient was positive on 2 MRSA test and was given vancomycin per protocol. She was taken to the operating room for general anesthesia.  In the supine position the left leg was placed in a padded well leg holder and the right leg was placed in an arthroscopic leg holder. The leg was then prepped and draped sterilely  After sterile draping a timeout procedure was completed  A lateral portal was established the scope was placed through the lateral portal and a diagnostic arthroscopy was performed. A medial meniscal tear was found in mild degenerative changes of the patellofemoral joint and medial compartment. There was some free edge fraying of the anterior horn of lateral meniscus but no tear was present.  Anterior cruciate ligament was intact  A medial portal was established and a second diagnostic arthroscopy was completed using a probe.  A straight duckbill forceps was used to morcellized meniscal tear the fragments were removed with a motorized shaver. The meniscus was then balanced with an arthroscopic wand from the [employer] set using the 50 wand. The probe was placed back into the joint and a stable rim was confirmed. The knee was washed thoroughly suctioned dry and 2 sutures were placed in each one in each portal. A total of 60 cc of Marcaine was injected into the joint. Sterile bandage followed. Ace wrap follow.  Cryo/Cuff was placed and activated.  Patient was taken to  the recovery room in stable condition  SURGEON:  Surgeon(s) and Role:    * Vickki Hearing, MD - Primary  PHYSICIAN ASSISTANT:   ASSISTANTS: none   ANESTHESIA:   general  EBL:  Total I/O In: 500 [I.V.:500] Out: -   BLOOD ADMINISTERED:none  DRAINS: none   LOCAL MEDICATIONS USED:  MARCAINE  W/ epi  and Amount: 60 ml  SPECIMEN:  No Specimen  DISPOSITION OF SPECIMEN:  N/A  COUNTS:  YES  TOURNIQUET:    DICTATION: .Dragon Dictation  PLAN OF CARE: Discharge to home after PACU  PATIENT DISPOSITION:  PACU - hemodynamically stable.   Delay start of Pharmacological VTE agent (>24hrs) due to surgical blood loss or risk of bleeding: not applicable

## 2012-02-28 NOTE — Interval H&P Note (Signed)
History and Physical Interval Note:  02/28/2012 7:27 AM  Stephanie [name]  has presented today for surgery, with the diagnosis of medial menicus tear  The various methods of treatment have been discussed with the patient and family. After consideration of risks, benefits and other options for treatment, the patient has consented to  Procedure(s): KNEE ARTHROSCOPY WITH MEDIAL MENISECTOMY (Right) as a surgical intervention .  The patient's history has been reviewed, patient examined, no change in status, stable for surgery.  I have reviewed the patient's chart and labs.  Questions were answered to the patient's satisfaction.    SARK MEDIAL MENISECTOMY  Fuller [place]

## 2012-02-28 NOTE — Anesthesia Preprocedure Evaluation (Addendum)
Anesthesia Evaluation  Patient identified by MRN, date of birth, ID band Patient awake    Reviewed: Allergy & Precautions, H&P , NPO status , Patient's Chart, lab work & pertinent test results  Airway Mallampati: II TM Distance: >3 FB Neck ROM: Full    Dental  (+) Poor Dentition and Missing   Pulmonary asthma ,    Pulmonary exam normal       Cardiovascular hypertension, Pt. on medications Rhythm:Regular Rate:Normal     Neuro/Psych negative neurological ROS  negative psych ROS   GI/Hepatic negative GI ROS, Neg liver ROS,   Endo/Other  diabetes, Poorly Controlled, Type 2, Oral Hypoglycemic Agents  Renal/GU negative Renal ROS     Musculoskeletal   Abdominal (+) + obese,  Abdomen: soft.    Peds  Hematology  (+) Blood dyscrasia, anemia ,   Anesthesia Other Findings   Reproductive/Obstetrics                          Anesthesia Physical Anesthesia Plan  ASA: II  Anesthesia Plan: General   Post-op Pain Management:    Induction: Intravenous  Airway Management Planned: LMA  Additional Equipment:   Intra-op Plan:   Post-operative Plan: Extubation in OR  Informed Consent: I have reviewed the patients History and Physical, chart, labs and discussed the procedure including the risks, benefits and alternatives for the proposed anesthesia with the patient or authorized representative who has indicated his/her understanding and acceptance.     Plan Discussed with: CRNA  Anesthesia Plan Comments:         Anesthesia Quick Evaluation

## 2012-02-28 NOTE — Anesthesia Procedure Notes (Signed)
Procedure Name: MAC Date/Time: 02/28/2012 7:44 AM Performed by: Carolyne Littles, Kaio Kuhlman L Pre-anesthesia Checklist: Patient identified, Patient being monitored, Emergency Drugs available, Timeout performed and Suction available Patient Re-evaluated:Patient Re-evaluated prior to inductionOxygen Delivery Method: Circle system utilized Preoxygenation: Pre-oxygenation with 100% oxygen Intubation Type: IV induction Ventilation: Mask ventilation without difficulty LMA: LMA inserted LMA Size: 4.0 Number of attempts: 1 Placement Confirmation: positive ETCO2 and breath sounds checked- equal and bilateral Tube secured with: Tape Dental Injury: Teeth and Oropharynx as per pre-operative assessment

## 2012-02-28 NOTE — Preoperative (Signed)
Beta Blockers   Reason not to administer Beta Blockers:Not Applicable 

## 2012-02-28 NOTE — Brief Op Note (Addendum)
02/28/2012  8:31 AM  PATIENT:  Percival Spanish  77 y.o. female  PRE-OPERATIVE DIAGNOSIS:  medial menicus tear  POST-OPERATIVE DIAGNOSIS:  medial menicus tear  FINDINGS: TORN MEDIAL MENISCUS WITH MILD DJD MEDIAL AND PATELLOFEMORAL COMPARTMENT   PROCEDURE:  Procedure(s): KNEE ARTHROSCOPY WITH MEDIAL MENISECTOMY (Right)  SURGEON:  Surgeon(s) and Role:    * Vickki Hearing, MD - Primary  PHYSICIAN ASSISTANT:   ASSISTANTS: none   ANESTHESIA:   general  EBL:  Total I/O In: 500 [I.V.:500] Out: -   BLOOD ADMINISTERED:none  DRAINS: none   LOCAL MEDICATIONS USED:  MARCAINE  W/ epi  and Amount: 60 ml  SPECIMEN:  No Specimen  DISPOSITION OF SPECIMEN:  N/A  COUNTS:  YES  TOURNIQUET:    DICTATION: .Dragon Dictation  PLAN OF CARE: Discharge to home after PACU  PATIENT DISPOSITION:  PACU - hemodynamically stable.   Delay start of Pharmacological VTE agent (>24hrs) due to surgical blood loss or risk of bleeding: not applicable

## 2012-02-28 NOTE — Anesthesia Postprocedure Evaluation (Signed)
  Anesthesia Post-op Note  Patient: Stephanie [name]  Procedure(s) Performed: Procedure(s): KNEE ARTHROSCOPY WITH MEDIAL MENISECTOMY (Right)  Patient Location: PACU  Anesthesia Type: General  Level of Consciousness: awake, alert , oriented and patient cooperative  Airway and Oxygen Therapy: Patient Spontanous Breathing and Patient connected to face mask oxygen  Post-op Pain: mild  Post-op Assessment: Post-op Vital signs reviewed, Patient's Cardiovascular Status Stable, Respiratory Function Stable, Patent Airway and No signs of Nausea or vomiting  Post-op Vital Signs: Reviewed and stable  Complications: No apparent anesthesia complications

## 2012-02-28 NOTE — Transfer of Care (Signed)
  Anesthesia Post-op Note  Patient: Stephanie [name]  Procedure(s) Performed: Procedure(s): KNEE ARTHROSCOPY WITH MEDIAL MENISECTOMY (Right)  Patient Location: PACU  Anesthesia Type: General  Level of Consciousness: awake, alert , oriented and patient cooperative  Airway and Oxygen Therapy: Patient Spontanous Breathing and Patient connected to face mask oxygen  Post-op Pain: mild  Post-op Assessment: Post-op Vital signs reviewed, Patient's Cardiovascular Status Stable, Respiratory Function Stable, Patent Airway and No signs of Nausea or vomiting  Post-op Vital Signs: Reviewed and stable  Complications: No apparent anesthesia complications 

## 2012-02-29 ENCOUNTER — Encounter (HOSPITAL_COMMUNITY): Payer: Self-pay | Admitting: Orthopedic Surgery

## 2012-03-06 ENCOUNTER — Encounter: Payer: Self-pay | Admitting: Orthopedic Surgery

## 2012-03-06 ENCOUNTER — Ambulatory Visit (INDEPENDENT_AMBULATORY_CARE_PROVIDER_SITE_OTHER): Payer: Medicare Other | Admitting: Orthopedic Surgery

## 2012-03-06 VITALS — BP 130/68 | Ht 65.0 in | Wt 211.0 lb

## 2012-03-06 DIAGNOSIS — Z9889 Other specified postprocedural states: Secondary | ICD-10-CM

## 2012-03-06 MED ORDER — HYDROCODONE-ACETAMINOPHEN 5-325 MG PO TABS: 1.0000 | ORAL_TABLET | ORAL | Status: DC | PRN

## 2012-03-06 NOTE — Patient Instructions (Addendum)
Go to [place] for PT   OOW X 3 more weeks

## 2012-03-06 NOTE — Progress Notes (Signed)
Patient ID: Stephanie [name], female   DOB: Jul 13, 1935, 77 y.o.   MRN: [id number] Chief Complaint  Patient presents with  . Follow-up    Post op 1 SARK DOS 02/28/12    BP 130/68  Ht 5\' 5"  (1.651 m)  Wt 211 lb (95.709 kg)  BMI 35.11 kg/m2  She had a medial meniscectomy and debridement of her medial femoral condyle for mild degenerative arthrosis,  She's doing well walking without assistance, with a slight limp. She has full passive extension and 95 of knee flexion. Her portal sites are clean. The sutures were removed.   The patient declines physical therapy and wishes to do it on her own at home, so we'll send her to the therapist for an exercise program to be done at, home. She'll continue out of work for the next 3 weeks follow with [place] in 3 weeks

## 2012-03-27 ENCOUNTER — Ambulatory Visit (INDEPENDENT_AMBULATORY_CARE_PROVIDER_SITE_OTHER): Payer: Medicare Other | Admitting: Orthopedic Surgery

## 2012-03-27 ENCOUNTER — Encounter: Payer: Self-pay | Admitting: Orthopedic Surgery

## 2012-03-27 VITALS — BP 138/60 | Ht 65.0 in | Wt 211.0 lb

## 2012-03-27 DIAGNOSIS — Z9889 Other specified postprocedural states: Secondary | ICD-10-CM

## 2012-03-27 NOTE — Progress Notes (Signed)
Patient ID: Stephanie [name], female   DOB: 11/27/1935, 77 y.o.   MRN: [id number] Chief Complaint  Patient presents with  . Follow-up    post op 2 right SARK DOS 02/28/12    BP 138/60  Ht 5\' 5"  (1.651 m)  Wt 211 lb (95.709 kg)  BMI 35.11 kg/m2  Status post knee arthroscopy almost a month ago. She wants to try home therapy with one visit to the physical therapist that the weather prevented her from going to therapy so she's been doing knee flexion exercises and quad sets. She's walking without assistive device but has a limp she says her knee is sore  She has no effusion she walks with a limp knee flexion active 100 passive 125  Portal sites are clean  Recommend 3 times a week physical therapy return before April to assess she can do back to work

## 2012-03-27 NOTE — Patient Instructions (Addendum)
Start therapy   OOW till April 1

## 2012-04-12 ENCOUNTER — Encounter: Payer: Self-pay | Admitting: Orthopedic Surgery

## 2012-04-12 ENCOUNTER — Ambulatory Visit (INDEPENDENT_AMBULATORY_CARE_PROVIDER_SITE_OTHER): Payer: Medicare Other | Admitting: Orthopedic Surgery

## 2012-04-12 VITALS — BP 140/70 | Ht 65.0 in | Wt 211.0 lb

## 2012-04-12 DIAGNOSIS — M23329 Other meniscus derangements, posterior horn of medial meniscus, unspecified knee: Secondary | ICD-10-CM

## 2012-04-12 DIAGNOSIS — IMO0002 Reserved for concepts with insufficient information to code with codable children: Secondary | ICD-10-CM

## 2012-04-12 DIAGNOSIS — M1710 Unilateral primary osteoarthritis, unspecified knee: Secondary | ICD-10-CM | POA: Insufficient documentation

## 2012-04-12 DIAGNOSIS — M23321 Other meniscus derangements, posterior horn of medial meniscus, right knee: Secondary | ICD-10-CM

## 2012-04-12 NOTE — Progress Notes (Signed)
Patient ID: Stephanie [name], female   DOB: 1936/01/07, 77 y.o.   MRN: [id number] BP 140/70  Ht 5\' 5"  (1.651 m)  Wt 211 lb (95.709 kg)  BMI 35.11 kg/m2 Chief Complaint  Patient presents with  . Follow-up    post op 3 SARK DOS 02/28/12    Postop knee scope doing well finish therapy wishes to go back to work knee looks good full range of motion no effusion no tenderness  Return as needed

## 2012-04-12 NOTE — Patient Instructions (Signed)
RTW  On April 1

## 2013-06-27 ENCOUNTER — Encounter (HOSPITAL_COMMUNITY): Payer: Self-pay | Admitting: Emergency Medicine

## 2013-06-27 ENCOUNTER — Emergency Department (HOSPITAL_COMMUNITY)
Admission: EM | Admit: 2013-06-27 | Discharge: 2013-06-27 | Disposition: A | Discharge status: Home / Self Care | Payer: Medicare Other | Attending: Emergency Medicine | Admitting: Emergency Medicine

## 2013-06-27 ENCOUNTER — Emergency Department (HOSPITAL_COMMUNITY): Payer: Medicare Other

## 2013-06-27 DIAGNOSIS — Z79899 Other long term (current) drug therapy: Secondary | ICD-10-CM | POA: Insufficient documentation

## 2013-06-27 DIAGNOSIS — M169 Osteoarthritis of hip, unspecified: Secondary | ICD-10-CM

## 2013-06-27 DIAGNOSIS — Z7982 Long term (current) use of aspirin: Secondary | ICD-10-CM | POA: Insufficient documentation

## 2013-06-27 DIAGNOSIS — Z794 Long term (current) use of insulin: Secondary | ICD-10-CM | POA: Insufficient documentation

## 2013-06-27 DIAGNOSIS — Z87891 Personal history of nicotine dependence: Secondary | ICD-10-CM | POA: Insufficient documentation

## 2013-06-27 DIAGNOSIS — M1610 Unilateral primary osteoarthritis, unspecified hip: Secondary | ICD-10-CM | POA: Insufficient documentation

## 2013-06-27 DIAGNOSIS — J45909 Unspecified asthma, uncomplicated: Secondary | ICD-10-CM | POA: Insufficient documentation

## 2013-06-27 DIAGNOSIS — I10 Essential (primary) hypertension: Secondary | ICD-10-CM | POA: Insufficient documentation

## 2013-06-27 DIAGNOSIS — E119 Type 2 diabetes mellitus without complications: Secondary | ICD-10-CM | POA: Insufficient documentation

## 2013-06-27 DIAGNOSIS — Z880 Allergy status to penicillin: Secondary | ICD-10-CM | POA: Insufficient documentation

## 2013-06-27 MED ORDER — HYDROCODONE-ACETAMINOPHEN 5-325 MG PO TABS
1.0000 | ORAL_TABLET | Freq: Once | ORAL | Status: AC
  Administered 2013-06-27: 1 via ORAL
  Filled 2013-06-27: qty 1

## 2013-06-27 MED ORDER — HYDROCODONE-ACETAMINOPHEN 5-325 MG PO TABS: 1.0000 | ORAL_TABLET | ORAL | Status: DC | PRN

## 2013-06-27 MED ORDER — IBUPROFEN 800 MG PO TABS
800.0000 mg | ORAL_TABLET | Freq: Once | ORAL | Status: AC
  Administered 2013-06-27: 800 mg via ORAL
  Filled 2013-06-27: qty 1

## 2013-06-27 NOTE — Discharge Instructions (Signed)
Your x-ray is negative for fracture or dislocation. Your x-ray does show degenerative joint disease involving your hip. Please use 3 ibuprofen tablets after breakfast lunch dinner and at bedtime. Please use Norco for more severe pain. Norco may cause drowsiness, and/or constipation. Please use with caution. Please see Dr. Aline Brochure for additional evaluation concerning your hip. Osteoarthritis Osteoarthritis is a disease that causes soreness and swelling (inflammation) of a joint. It occurs when the cartilage at the affected joint wears down. Cartilage acts as a cushion, covering the ends of bones where they meet to form a joint. Osteoarthritis is the most common form of arthritis. It often occurs in older people. The joints affected most often by this condition include those in the:  Ends of the fingers.  Thumbs.  Neck.  Lower back.  Knees.  Hips. CAUSES  Over time, the cartilage that covers the ends of bones begins to wear away. This causes bone to rub on bone, producing pain and stiffness in the affected joints.  RISK FACTORS Certain factors can increase your chances of having osteoarthritis, including:  Older age.  Excessive body weight.  Overuse of joints. SIGNS AND SYMPTOMS   Pain, swelling, and stiffness in the joint.  Over time, the joint may lose its normal shape.  Small deposits of bone (osteophytes) may grow on the edges of the joint.  Bits of bone or cartilage can break off and float inside the joint space. This may cause more pain and damage. DIAGNOSIS  Your health care provider will do a physical exam and ask about your symptoms. Various tests may be ordered, such as:  X-rays of the affected joint.  An MRI scan.  Blood tests to rule out other types of arthritis.  Joint fluid tests. This involves using a needle to draw fluid from the joint and examining the fluid under a microscope. TREATMENT  Goals of treatment are to control pain and improve joint function.  Treatment plans may include:  A prescribed exercise program that allows for rest and joint relief.  A weight control plan.  Pain relief techniques, such as:  Properly applied heat and cold.  Electric pulses delivered to nerve endings under the skin (transcutaneous electrical nerve stimulation, TENS).  Massage.  Certain nutritional supplements.  Medicines to control pain, such as:  Acetaminophen.  Nonsteroidal anti-inflammatory drugs (NSAIDs), such as naproxen.  Narcotic or central-acting agents, such as tramadol.  Corticosteroids. These can be given orally or as an injection.  Surgery to reposition the bones and relieve pain (osteotomy) or to remove loose pieces of bone and cartilage. Joint replacement may be needed in advanced states of osteoarthritis. HOME CARE INSTRUCTIONS   Only take over-the-counter or prescription medicines as directed by your health care provider. Take all medicines exactly as instructed.  Maintain a healthy weight. Follow your health care provider's instructions for weight control. This may include dietary instructions.  Exercise as directed. Your health care provider can recommend specific types of exercise. These may include:  Strengthening exercises These are done to strengthen the muscles that support joints affected by arthritis. They can be performed with weights or with exercise bands to add resistance.  Aerobic activities These are exercises, such as brisk walking or low-impact aerobics, that get your heart pumping.  Range-of-motion activities These keep your joints limber.  Balance and agility exercises These help you maintain daily living skills.  Rest your affected joints as directed by your health care provider.  Follow up with your health care provider as  directed. SEEK MEDICAL CARE IF:   Your skin turns red.  You develop a rash in addition to your joint pain.  You have worsening joint pain. SEEK IMMEDIATE MEDICAL CARE  IF:  You have a significant loss of weight or appetite.  You have a fever along with joint or muscle aches.  You have night sweats. [place] of Arthritis and Musculoskeletal and Skin Diseases: www.niams.SouthExposed.es [employer] on Aging: http://kim-miller.com/ American College of Rheumatology: www.rheumatology.org Document Released: 01/04/2005 Document Revised: 10/25/2012 Document Reviewed: 09/11/2012 [facility] Patient Information 2014 [place], [place].

## 2013-06-27 NOTE — ED Provider Notes (Signed)
CSN: [id number]     Arrival date & time 06/27/13  1324 History   None    Chief Complaint  Patient presents with  . Hip Pain     (Consider location/radiation/quality/duration/timing/severity/associated sxs/prior Treatment) HPI Comments: The patient is a 78 year old female who presents to the emergency department with left hip pain. The patient states that approximately a month ago she fell and injured her left side. She states that since that time she's been having pain in the left hip, and noticing increasing difficulty with walking because of pain in that area. The pain seems to be worse when she first gets up in the morning, but gets better as the day progresses most of the time. Patient states the pain is worse when the weather changes. She's not had any previous operations or procedures involving the left hip.  Patient is a 78 y.o. female presenting with hip pain. The history is provided by the patient.  Hip Pain Associated symptoms include arthralgias. Pertinent negatives include no abdominal pain, chest pain, coughing or neck pain.    Past Medical History  Diagnosis Date  . Asthma   . Diabetes mellitus   . Hypertension    Past Surgical History  Procedure Laterality Date  . Cyst removal hand      Right, hospital in [place]  . Knee arthroscopy with medial menisectomy Right 02/28/2012    Procedure: KNEE ARTHROSCOPY WITH MEDIAL MENISECTOMY;  Surgeon: Carole Civil, MD;  Location: [facility];  Service: Orthopedics;  Laterality: Right;   Family History  Problem Relation Age of Onset  . Lung disease    . Cancer    . Arthritis    . Asthma    . Diabetes     History  Substance Use Topics  . Smoking status: Former Smoker    Types: Cigarettes  . Smokeless tobacco: Not on file  . Alcohol Use: No   OB History   Grav Para Term Preterm Abortions TAB SAB Ect Mult Living                 Review of Systems  Constitutional: Negative for activity change.       All ROS Neg except as  noted in HPI  HENT: Negative for nosebleeds.   Eyes: Negative for photophobia and discharge.  Respiratory: Negative for cough, shortness of breath and wheezing.   Cardiovascular: Negative for chest pain and palpitations.  Gastrointestinal: Negative for abdominal pain and blood in stool.  Genitourinary: Negative for dysuria, frequency and hematuria.  Musculoskeletal: Positive for arthralgias. Negative for back pain and neck pain.  Skin: Negative.   Neurological: Negative for dizziness, seizures and speech difficulty.  Psychiatric/Behavioral: Negative for hallucinations and confusion.      Allergies  Amoxicillin; Penicillins; and Sulfa antibiotics  Home Medications   Prior to Admission medications   Medication Sig Start Date End Date Taking? Authorizing Provider  ADVAIR HFA 115-21 MCG/ACT inhaler Inhale 1 puff into the lungs 2 (two) times daily. 05/18/13  Yes Historical Provider, MD  albuterol (PROVENTIL) (2.5 MG/3ML) 0.083% nebulizer solution Take 2.5 mg by nebulization daily as needed. Shortness Of Breath   Yes Historical Provider, MD  aspirin 325 MG tablet Take 325 mg by mouth daily.   Yes Historical Provider, MD  glipiZIDE (GLUCOTROL) 5 MG tablet Take 2.5 mg by mouth 2 (two) times daily. Take at breakfast and supper   Yes Historical Provider, MD  hydrOXYzine (ATARAX/VISTARIL) 25 MG tablet Take 25 mg by mouth 3 (three) times  daily as needed. Itching   Yes Historical Provider, MD  insulin glargine (LANTUS) 100 UNIT/ML injection Inject 40 Units into the skin at bedtime.   Yes Historical Provider, MD  lisinopril (PRINIVIL,ZESTRIL) 20 MG tablet Take 40 mg by mouth daily.   Yes Historical Provider, MD  metFORMIN (GLUCOPHAGE) 1000 MG tablet Take 1,000 mg by mouth 2 (two) times daily.   Yes Historical Provider, MD  simvastatin (ZOCOR) 80 MG tablet Take 80 mg by mouth at bedtime.   Yes Historical Provider, MD  triamterene-hydrochlorothiazide (MAXZIDE-25) 37.5-25 MG per tablet Take 1 tablet by  mouth daily.   Yes Historical Provider, MD   BP 147/80  Pulse 86  Temp(Src) 97.8 F (36.6 C) (Oral)  Resp 20  SpO2 94% Physical Exam  Nursing note and vitals reviewed. Constitutional: She is oriented to person, place, and time. She appears well-developed and well-nourished.  Non-toxic appearance.  HENT:  Head: Normocephalic.  Right Ear: Tympanic membrane and external ear normal.  Left Ear: Tympanic membrane and external ear normal.  Eyes: EOM and lids are normal. Pupils are equal, round, and reactive to light.  Neck: Normal range of motion. Neck supple. Carotid bruit is not present.  Cardiovascular: Normal rate, regular rhythm, normal heart sounds, intact distal pulses and normal pulses.   Pulmonary/Chest: Breath sounds normal. No respiratory distress.  Abdominal: Soft. Bowel sounds are normal. There is no tenderness. There is no guarding.  Musculoskeletal: Normal range of motion.  There is no shortening or deformity of the lower extremities. There is pain with attempted range of motion of the left hip. There degenerative joint disease changes of both knees. The dorsalis pedis pulses 2+. The capillary refill is less than 2 seconds bilaterally.  Lymphadenopathy:       Head (right side): No submandibular adenopathy present.       Head (left side): No submandibular adenopathy present.    She has no cervical adenopathy.  Neurological: She is alert and oriented to person, place, and time. She has normal strength. No cranial nerve deficit or sensory deficit.  Skin: Skin is warm and dry.  Psychiatric: She has a normal mood and affect. Her speech is normal.    ED Course  Procedures (including critical care time) Labs Review Labs Reviewed - No data to display  Imaging Review Dg Hip Complete Left  06/27/2013   CLINICAL DATA:  Left hip pain following injury  EXAM: LEFT HIP - COMPLETE 2+ VIEW  COMPARISON:  None.  FINDINGS: Mild degenerative changes of the hip joints are noted bilaterally.  No acute fracture is seen. No gross soft tissue abnormality is noted.  IMPRESSION: No acute abnormality seen.   Electronically Signed   By: Inez Catalina M.D.   On: 06/27/2013 14:08     EKG Interpretation None      MDM X-ray of the left hip and pelvis are negative for fracture or dislocation. They do reveal some degenerative joint disease changes. The patient will be treated with ibuprofen a.c. and at bedtime. A prescription for Norco every 4 hours as needed for pain is also given. I have suggested to the patient that she see Dr. Aline Brochure again as he has given an orthopedic opinion on this patient in the past concerning for additional evaluation and management of the hip area pain.    Final diagnoses:  None    *I have reviewed nursing notes, vital signs, and all appropriate lab and imaging results for this patient.**    Lenox Ahr,  PA-C 06/27/13 1509

## 2013-06-27 NOTE — ED Notes (Signed)
Pt states that she fell a month ago and landed on left side, has c/o pain to left hip since, pt with difficultly with walking to due to left hip pain but pain is better through out the day, pt takes ASA

## 2013-06-28 NOTE — ED Provider Notes (Signed)
Medical screening examination/treatment/procedure(s) were performed by non-physician practitioner and as supervising physician I was immediately available for consultation/collaboration.   EKG Interpretation None        Fredia Sorrow, MD 06/28/13 1115

## 2013-08-13 ENCOUNTER — Ambulatory Visit: Payer: Self-pay | Admitting: Nurse Practitioner

## 2013-08-14 ENCOUNTER — Ambulatory Visit: Payer: Medicare Other | Admitting: Orthopedic Surgery

## 2013-11-01 ENCOUNTER — Encounter (HOSPITAL_COMMUNITY): Payer: Self-pay | Admitting: Emergency Medicine

## 2013-11-01 ENCOUNTER — Emergency Department (HOSPITAL_COMMUNITY): Payer: Medicare Other

## 2013-11-01 ENCOUNTER — Emergency Department (HOSPITAL_COMMUNITY)
Admission: EM | Admit: 2013-11-01 | Discharge: 2013-11-01 | Disposition: A | Discharge status: Home / Self Care | Payer: Medicare Other | Attending: Emergency Medicine | Admitting: Emergency Medicine

## 2013-11-01 DIAGNOSIS — Z7982 Long term (current) use of aspirin: Secondary | ICD-10-CM | POA: Diagnosis not present

## 2013-11-01 DIAGNOSIS — E119 Type 2 diabetes mellitus without complications: Secondary | ICD-10-CM | POA: Insufficient documentation

## 2013-11-01 DIAGNOSIS — Z87891 Personal history of nicotine dependence: Secondary | ICD-10-CM | POA: Insufficient documentation

## 2013-11-01 DIAGNOSIS — E780 Pure hypercholesterolemia: Secondary | ICD-10-CM | POA: Insufficient documentation

## 2013-11-01 DIAGNOSIS — Z880 Allergy status to penicillin: Secondary | ICD-10-CM | POA: Diagnosis not present

## 2013-11-01 DIAGNOSIS — Z794 Long term (current) use of insulin: Secondary | ICD-10-CM | POA: Diagnosis not present

## 2013-11-01 DIAGNOSIS — Z79899 Other long term (current) drug therapy: Secondary | ICD-10-CM | POA: Diagnosis not present

## 2013-11-01 DIAGNOSIS — J45901 Unspecified asthma with (acute) exacerbation: Secondary | ICD-10-CM | POA: Diagnosis not present

## 2013-11-01 DIAGNOSIS — Z7951 Long term (current) use of inhaled steroids: Secondary | ICD-10-CM | POA: Diagnosis not present

## 2013-11-01 DIAGNOSIS — R1110 Vomiting, unspecified: Secondary | ICD-10-CM | POA: Insufficient documentation

## 2013-11-01 DIAGNOSIS — I10 Essential (primary) hypertension: Secondary | ICD-10-CM | POA: Diagnosis not present

## 2013-11-01 HISTORY — DX: Pure hypercholesterolemia, unspecified: E78.00

## 2013-11-01 LAB — CBC WITH DIFFERENTIAL/PLATELET
Basophils Absolute: 0 10*3/uL (ref 0.0–0.1)
Basophils Relative: 0 % (ref 0–1)
Eosinophils Absolute: 0 10*3/uL (ref 0.0–0.7)
Eosinophils Relative: 0 % (ref 0–5)
HCT: 33.9 % — ABNORMAL LOW (ref 36.0–46.0)
Hemoglobin: 10.8 g/dL — ABNORMAL LOW (ref 12.0–15.0)
Lymphocytes Relative: 22 % (ref 12–46)
Lymphs Abs: 1.4 10*3/uL (ref 0.7–4.0)
MCH: 24.8 pg — ABNORMAL LOW (ref 26.0–34.0)
MCHC: 31.9 g/dL (ref 30.0–36.0)
MCV: 77.8 fL — ABNORMAL LOW (ref 78.0–100.0)
Monocytes Absolute: 0.6 10*3/uL (ref 0.1–1.0)
Monocytes Relative: 10 % (ref 3–12)
Neutro Abs: 4.2 10*3/uL (ref 1.7–7.7)
Neutrophils Relative %: 68 % (ref 43–77)
Platelets: 246 10*3/uL (ref 150–400)
RBC: 4.36 MIL/uL (ref 3.87–5.11)
RDW: 16.5 % — ABNORMAL HIGH (ref 11.5–15.5)
WBC: 6.3 10*3/uL (ref 4.0–10.5)

## 2013-11-01 LAB — COMPREHENSIVE METABOLIC PANEL
ALT: 11 U/L (ref 0–35)
AST: 22 U/L (ref 0–37)
Albumin: 3.8 g/dL (ref 3.5–5.2)
Alkaline Phosphatase: 94 U/L (ref 39–117)
Anion gap: 13 (ref 5–15)
BUN: 23 mg/dL (ref 6–23)
CO2: 26 mEq/L (ref 19–32)
Calcium: 10.6 mg/dL — ABNORMAL HIGH (ref 8.4–10.5)
Chloride: 97 mEq/L (ref 96–112)
Creatinine, Ser: 1.26 mg/dL — ABNORMAL HIGH (ref 0.50–1.10)
GFR calc Af Amer: 47 mL/min — ABNORMAL LOW (ref >90)
GFR calc non Af Amer: 41 mL/min — ABNORMAL LOW (ref >90)
Glucose, Bld: 95 mg/dL (ref 70–99)
Potassium: 5.2 mEq/L (ref 3.7–5.3)
Sodium: 136 mEq/L — ABNORMAL LOW (ref 137–147)
Total Bilirubin: 0.3 mg/dL (ref 0.3–1.2)
Total Protein: 7.4 g/dL (ref 6.0–8.3)

## 2013-11-01 LAB — PRO B NATRIURETIC PEPTIDE: Pro B Natriuretic peptide (BNP): 61.7 pg/mL (ref 0–450)

## 2013-11-01 MED ORDER — SODIUM CHLORIDE 0.9 % IV BOLUS (SEPSIS)
500.0000 mL | Freq: Once | INTRAVENOUS | Status: AC
  Administered 2013-11-01: 500 mL via INTRAVENOUS

## 2013-11-01 MED ORDER — ONDANSETRON HCL 4 MG/2ML IJ SOLN
4.0000 mg | Freq: Once | INTRAMUSCULAR | Status: AC
  Administered 2013-11-01: 4 mg via INTRAVENOUS
  Filled 2013-11-01: qty 2

## 2013-11-01 MED ORDER — ONDANSETRON 4 MG PO TBDP: ORAL_TABLET | ORAL | Status: DC

## 2013-11-01 NOTE — ED Provider Notes (Signed)
CSN: [id number]     Arrival date & time 11/01/13  1430 History  This chart was scribed for Stephanie Diego, MD by Rayfield Citizen, ED Scribe. This patient was seen in room APA04/APA04 and the patient's care was started at 3:09 PM.    Chief Complaint  Patient presents with  . Emesis   Patient is a 78 y.o. female presenting with vomiting. The history is provided by the patient. No language interpreter was used.  Emesis Severity:  Mild Duration:  4 days Timing:  Intermittent Progression:  Unchanged Associated symptoms: no abdominal pain, no diarrhea and no headaches     HPI Comments: Stephanie LOUGHMILLER is a 78 y.o. female who presents to the Emergency Department complaining of 4 days of vomiting (2x today). Patient notes productive cough (yellow and green sputum) and associated SOB. Patient's daughter reports subjective fever; explains that the patient runs hot and cold. Patient believes her vomiting is related to a new medication, prescribed at [facility] 4 days PTA due to pnuemonia. She denies abdominal pain.   Patient is a nonsmoker.    Past Medical History  Diagnosis Date  . Asthma   . Diabetes mellitus   . Hypertension   . Hypercholesteremia    Past Surgical History  Procedure Laterality Date  . Cyst removal hand      Right, hospital in [place]  . Knee arthroscopy with medial menisectomy Right 02/28/2012    Procedure: KNEE ARTHROSCOPY WITH MEDIAL MENISECTOMY;  Surgeon: Carole Civil, MD;  Location: [facility];  Service: Orthopedics;  Laterality: Right;   Family History  Problem Relation Age of Onset  . Lung disease    . Cancer    . Arthritis    . Asthma    . Diabetes     History  Substance Use Topics  . Smoking status: Former Smoker    Types: Cigarettes  . Smokeless tobacco: Not on file  . Alcohol Use: No   OB History   Grav Para Term Preterm Abortions TAB SAB Ect Mult Living                 Review of Systems  Constitutional: Negative for appetite change and fatigue.  Fever: Subjective fever and chills.  HENT: Negative for congestion, ear discharge and sinus pressure.   Eyes: Negative for discharge.  Respiratory: Positive for cough.   Cardiovascular: Negative for chest pain.  Gastrointestinal: Positive for vomiting. Negative for abdominal pain and diarrhea.  Genitourinary: Negative for frequency and hematuria.  Musculoskeletal: Negative for back pain.  Skin: Negative for rash.  Neurological: Negative for seizures and headaches.  Psychiatric/Behavioral: Negative for hallucinations.     Allergies  Amoxicillin; Penicillins; and Sulfa antibiotics  Home Medications   Prior to Admission medications   Medication Sig Start Date End Date Taking? Authorizing Provider  ADVAIR HFA 115-21 MCG/ACT inhaler Inhale 1 puff into the lungs 2 (two) times daily. 05/18/13   Historical Provider, MD  albuterol (PROVENTIL) (2.5 MG/3ML) 0.083% nebulizer solution Take 2.5 mg by nebulization daily as needed. Shortness Of Breath    Historical Provider, MD  aspirin 325 MG tablet Take 325 mg by mouth daily.    Historical Provider, MD  glipiZIDE (GLUCOTROL) 5 MG tablet Take 2.5 mg by mouth 2 (two) times daily. Take at breakfast and supper    Historical Provider, MD  HYDROcodone-acetaminophen (NORCO/VICODIN) 5-325 MG per tablet Take 1 tablet by mouth every 4 (four) hours as needed for moderate pain. 06/27/13   Evorn Gong  Marshell Levan, PA-C  hydrOXYzine (ATARAX/VISTARIL) 25 MG tablet Take 25 mg by mouth 3 (three) times daily as needed. Itching    Historical Provider, MD  insulin glargine (LANTUS) 100 UNIT/ML injection Inject 40 Units into the skin at bedtime.    Historical Provider, MD  lisinopril (PRINIVIL,ZESTRIL) 20 MG tablet Take 40 mg by mouth daily.    Historical Provider, MD  metFORMIN (GLUCOPHAGE) 1000 MG tablet Take 1,000 mg by mouth 2 (two) times daily.    Historical Provider, MD  simvastatin (ZOCOR) 80 MG tablet Take 80 mg by mouth at bedtime.    Historical Provider, MD   triamterene-hydrochlorothiazide (MAXZIDE-25) 37.5-25 MG per tablet Take 1 tablet by mouth daily.    Historical Provider, MD   BP 119/62  Pulse 74  Temp(Src) 98.6 F (37 C) (Oral)  Resp 14  SpO2 96% Physical Exam  Constitutional: She is oriented to person, place, and time. She appears well-developed.  HENT:  Head: Normocephalic.  Eyes: Conjunctivae and EOM are normal. No scleral icterus.  Neck: Neck supple. No thyromegaly present.  Cardiovascular: Normal rate and regular rhythm.  Exam reveals no gallop and no friction rub.   No murmur heard. Pulmonary/Chest: No stridor. She has no wheezes. She has no rales. She exhibits no tenderness.  Moderate wheezing bilaterally   Abdominal: She exhibits no distension. There is no tenderness. There is no rebound.  Musculoskeletal: Normal range of motion. She exhibits no edema.  Lymphadenopathy:    She has no cervical adenopathy.  Neurological: She is oriented to person, place, and time. She exhibits normal muscle tone. Coordination normal.  Skin: No rash noted. No erythema.  Psychiatric: She has a normal mood and affect. Her behavior is normal.    ED Course  Procedures   DIAGNOSTIC STUDIES: Oxygen Saturation is 96% on RA, adequate by my interpretation.    COORDINATION OF CARE: 3:09 PM Discussed treatment plan with pt at bedside and pt agreed to plan.   Labs Review Labs Reviewed - No data to display  Imaging Review No results found.   EKG Interpretation None      MDM   Final diagnoses:  None  The chart was scribed for me under my direct supervision.  I personally performed the history, physical, and medical decision making and all procedures in the evaluation of this patient.Stephanie Diego, MD 11/01/13 [phone]

## 2013-11-01 NOTE — ED Notes (Signed)
Pt alert & oriented x4, stable gait. Patient given discharge instructions, paperwork & prescription(s). Patient  instructed to stop at the registration desk to finish any additional paperwork. Patient verbalized understanding. Pt left department w/ no further questions. 

## 2013-11-01 NOTE — Discharge Instructions (Signed)
Follow up next week for recheck

## 2013-11-01 NOTE — ED Notes (Signed)
Seen at [facility] on Monday, and dx with " touch of pneumonia" ,since then has had vomiting ,vomited blood x1  .   No diarrhea.

## 2013-11-04 ENCOUNTER — Emergency Department (HOSPITAL_COMMUNITY)
Admission: EM | Admit: 2013-11-04 | Discharge: 2013-11-04 | Disposition: A | Discharge status: Home / Self Care | Payer: Medicare Other | Attending: Emergency Medicine | Admitting: Emergency Medicine

## 2013-11-04 ENCOUNTER — Emergency Department (HOSPITAL_COMMUNITY): Payer: Medicare Other

## 2013-11-04 ENCOUNTER — Encounter (HOSPITAL_COMMUNITY): Payer: Self-pay | Admitting: Emergency Medicine

## 2013-11-04 DIAGNOSIS — R110 Nausea: Secondary | ICD-10-CM | POA: Diagnosis present

## 2013-11-04 DIAGNOSIS — E785 Hyperlipidemia, unspecified: Secondary | ICD-10-CM | POA: Diagnosis not present

## 2013-11-04 DIAGNOSIS — E119 Type 2 diabetes mellitus without complications: Secondary | ICD-10-CM | POA: Diagnosis not present

## 2013-11-04 DIAGNOSIS — R918 Other nonspecific abnormal finding of lung field: Secondary | ICD-10-CM | POA: Diagnosis not present

## 2013-11-04 DIAGNOSIS — M1990 Unspecified osteoarthritis, unspecified site: Secondary | ICD-10-CM | POA: Diagnosis not present

## 2013-11-04 DIAGNOSIS — R062 Wheezing: Secondary | ICD-10-CM | POA: Insufficient documentation

## 2013-11-04 DIAGNOSIS — J069 Acute upper respiratory infection, unspecified: Secondary | ICD-10-CM | POA: Diagnosis not present

## 2013-11-04 DIAGNOSIS — Z7982 Long term (current) use of aspirin: Secondary | ICD-10-CM | POA: Diagnosis not present

## 2013-11-04 DIAGNOSIS — Z792 Long term (current) use of antibiotics: Secondary | ICD-10-CM | POA: Diagnosis not present

## 2013-11-04 DIAGNOSIS — I10 Essential (primary) hypertension: Secondary | ICD-10-CM | POA: Diagnosis not present

## 2013-11-04 DIAGNOSIS — Z79899 Other long term (current) drug therapy: Secondary | ICD-10-CM | POA: Insufficient documentation

## 2013-11-04 DIAGNOSIS — K529 Noninfective gastroenteritis and colitis, unspecified: Secondary | ICD-10-CM | POA: Insufficient documentation

## 2013-11-04 DIAGNOSIS — R9389 Abnormal findings on diagnostic imaging of other specified body structures: Secondary | ICD-10-CM

## 2013-11-04 HISTORY — DX: Hyperlipidemia, unspecified: E78.5

## 2013-11-04 HISTORY — DX: Type 2 diabetes mellitus without complications: E11.9

## 2013-11-04 HISTORY — DX: Unspecified osteoarthritis, unspecified site: M19.90

## 2013-11-04 LAB — URINALYSIS, ROUTINE W REFLEX MICROSCOPIC
Bilirubin Urine: NEGATIVE
Glucose, UA: NEGATIVE mg/dL
Hgb urine dipstick: NEGATIVE
Ketones, ur: NEGATIVE mg/dL
Leukocytes, UA: NEGATIVE
Nitrite: NEGATIVE
Protein, ur: NEGATIVE mg/dL
Specific Gravity, Urine: 1.02 (ref 1.005–1.030)
Urobilinogen, UA: 0.2 mg/dL (ref 0.0–1.0)
pH: 6 (ref 5.0–8.0)

## 2013-11-04 LAB — COMPREHENSIVE METABOLIC PANEL
ALT: 9 U/L (ref 0–35)
AST: 21 U/L (ref 0–37)
Albumin: 3.8 g/dL (ref 3.5–5.2)
Alkaline Phosphatase: 91 U/L (ref 39–117)
Anion gap: 12 (ref 5–15)
BUN: 25 mg/dL — ABNORMAL HIGH (ref 6–23)
CO2: 26 mEq/L (ref 19–32)
Calcium: 10.7 mg/dL — ABNORMAL HIGH (ref 8.4–10.5)
Chloride: 93 mEq/L — ABNORMAL LOW (ref 96–112)
Creatinine, Ser: 1.52 mg/dL — ABNORMAL HIGH (ref 0.50–1.10)
GFR calc Af Amer: 38 mL/min — ABNORMAL LOW (ref >90)
GFR calc non Af Amer: 32 mL/min — ABNORMAL LOW (ref >90)
Glucose, Bld: 69 mg/dL — ABNORMAL LOW (ref 70–99)
Potassium: 5.4 mEq/L — ABNORMAL HIGH (ref 3.7–5.3)
Sodium: 131 mEq/L — ABNORMAL LOW (ref 137–147)
Total Bilirubin: 0.4 mg/dL (ref 0.3–1.2)
Total Protein: 7.4 g/dL (ref 6.0–8.3)

## 2013-11-04 LAB — CBC WITH DIFFERENTIAL/PLATELET
Basophils Absolute: 0 10*3/uL (ref 0.0–0.1)
Basophils Relative: 0 % (ref 0–1)
Eosinophils Absolute: 0 10*3/uL (ref 0.0–0.7)
Eosinophils Relative: 0 % (ref 0–5)
HCT: 34 % — ABNORMAL LOW (ref 36.0–46.0)
Hemoglobin: 10.8 g/dL — ABNORMAL LOW (ref 12.0–15.0)
Lymphocytes Relative: 17 % (ref 12–46)
Lymphs Abs: 1.6 10*3/uL (ref 0.7–4.0)
MCH: 24.4 pg — ABNORMAL LOW (ref 26.0–34.0)
MCHC: 31.8 g/dL (ref 30.0–36.0)
MCV: 76.9 fL — ABNORMAL LOW (ref 78.0–100.0)
Monocytes Absolute: 0.9 10*3/uL (ref 0.1–1.0)
Monocytes Relative: 10 % (ref 3–12)
Neutro Abs: 6.8 10*3/uL (ref 1.7–7.7)
Neutrophils Relative %: 73 % (ref 43–77)
Platelets: 224 10*3/uL (ref 150–400)
RBC: 4.42 MIL/uL (ref 3.87–5.11)
RDW: 16.3 % — ABNORMAL HIGH (ref 11.5–15.5)
WBC: 9.4 10*3/uL (ref 4.0–10.5)

## 2013-11-04 LAB — LIPASE, BLOOD: Lipase: 41 U/L (ref 11–59)

## 2013-11-04 MED ORDER — ONDANSETRON HCL 4 MG/2ML IJ SOLN
4.0000 mg | Freq: Once | INTRAMUSCULAR | Status: AC
  Administered 2013-11-04: 4 mg via INTRAVENOUS
  Filled 2013-11-04: qty 2

## 2013-11-04 MED ORDER — SODIUM CHLORIDE 0.9 % IV BOLUS (SEPSIS)
1000.0000 mL | Freq: Once | INTRAVENOUS | Status: AC
  Administered 2013-11-04: 1000 mL via INTRAVENOUS

## 2013-11-04 MED ORDER — IPRATROPIUM-ALBUTEROL 0.5-2.5 (3) MG/3ML IN SOLN
3.0000 mL | Freq: Once | RESPIRATORY_TRACT | Status: AC
  Administered 2013-11-04: 3 mL via RESPIRATORY_TRACT
  Filled 2013-11-04: qty 3

## 2013-11-04 MED ORDER — ONDANSETRON HCL 8 MG PO TABS: 8.0000 mg | ORAL_TABLET | ORAL | Status: DC | PRN

## 2013-11-04 MED ORDER — METOCLOPRAMIDE HCL 5 MG/ML IJ SOLN
10.0000 mg | Freq: Once | INTRAMUSCULAR | Status: AC
  Administered 2013-11-04: 10 mg via INTRAVENOUS
  Filled 2013-11-04: qty 2

## 2013-11-04 MED ORDER — AZITHROMYCIN 250 MG PO TABS: ORAL_TABLET | ORAL | Status: DC

## 2013-11-04 MED ORDER — KETOROLAC TROMETHAMINE 30 MG/ML IJ SOLN
30.0000 mg | Freq: Once | INTRAMUSCULAR | Status: AC
  Administered 2013-11-04: 30 mg via INTRAVENOUS
  Filled 2013-11-04: qty 1

## 2013-11-04 MED ORDER — ALBUTEROL SULFATE (2.5 MG/3ML) 0.083% IN NEBU
2.5000 mg | INHALATION_SOLUTION | Freq: Once | RESPIRATORY_TRACT | Status: AC
  Administered 2013-11-04: 2.5 mg via RESPIRATORY_TRACT
  Filled 2013-11-04: qty 3

## 2013-11-04 MED ORDER — ALBUTEROL SULFATE HFA 108 (90 BASE) MCG/ACT IN AERS: 1.0000 | INHALATION_SPRAY | Freq: Four times a day (QID) | RESPIRATORY_TRACT | Status: DC | PRN

## 2013-11-04 NOTE — Discharge Instructions (Signed)
You have a 8 mm mass in the right lung. This will need followup with a repeat CT scan in 3-6 months. Discuss this with your primary care Dr.    Prescription for antibiotic, inhaler, nausea medication. Increase fluids.

## 2013-11-04 NOTE — ED Notes (Signed)
PT reports nausea with vomiting x6 days and was seen in ED but states she isn't getting any better. PT had one episode of diarrhea last pm and vomited twice. PT denies any abdominal pain at this time.

## 2013-11-04 NOTE — ED Provider Notes (Addendum)
CSN: [id number]     Arrival date & time 11/04/13  1323 History  This chart was scribed for Nat Christen, MD by Tula Nakayama, ED Scribe. This patient was seen in room APA10/APA10 and the patient's care was started at 2:48 PM.    Chief Complaint  Patient presents with  . Nausea   The history is provided by the patient. No language interpreter was used.   HPI Comments: Stephanie [name] is a 78 y.o. female with a history of DM, HTN, and HLD who presents to the Emergency Department complaining of nausea and vomiting that started 6 days ago. Pt has had 3 episodes of vomiting today. She also had 1 episode of diarrhea last night. Pt states wheezing as an associated symptom, but denies SOB and abdominal pain. Pt went to [facility] where she had an x-ray and was diagnosed with pneumonia. Pt's daughter states that nausea and vomiting started with medication. She continues to cough and wheeze.  Past Medical History  Diagnosis Date  . Asthma   . Diabetes mellitus   . Hypercholesteremia   . Diabetes mellitus without complication   . Arthritis   . Hypertension   . Hyperlipemia    Past Surgical History  Procedure Laterality Date  . Cyst removal hand      Right, hospital in [place]  . Knee arthroscopy with medial menisectomy Right 02/28/2012    Procedure: KNEE ARTHROSCOPY WITH MEDIAL MENISECTOMY;  Surgeon: Carole Civil, MD;  Location: [facility];  Service: Orthopedics;  Laterality: Right;  . Repair knee ligament     Family History  Problem Relation Age of Onset  . Lung disease    . Cancer    . Arthritis    . Asthma    . Diabetes     History  Substance Use Topics  . Smoking status: Never Smoker   . Smokeless tobacco: Never Used  . Alcohol Use: No   OB History    No data available     Review of Systems  Respiratory: Positive for wheezing. Negative for shortness of breath.   Gastrointestinal: Positive for nausea, vomiting and diarrhea. Negative for abdominal pain.     10 Systems reviewed and all are negative for acute change except as noted in the HPI.   Allergies  Amoxicillin; Amoxicillin; Penicillins; Sulfa antibiotics; and Sulfa antibiotics  Home Medications   Prior to Admission medications   Medication Sig Start Date End Date Taking? Authorizing Provider  ADVAIR HFA 115-21 MCG/ACT inhaler Inhale 1 puff into the lungs every 12 (twelve) hours. 08/24/13  Yes Historical Provider, MD  aspirin EC 325 MG tablet Take 325 mg by mouth daily.   Yes Historical Provider, MD  cholecalciferol (VITAMIN D) 1000 UNITS tablet Take 1,000 Units by mouth daily.   Yes Historical Provider, MD  LEVEMIR FLEXTOUCH 100 UNIT/ML Pen Inject 45 Units into the skin at bedtime.  11/03/13  Yes Historical Provider, MD  levofloxacin (LEVAQUIN) 750 MG tablet Take 750 mg by mouth 2 (two) times daily. 7 day course starting on 10/29/2013 10/29/13  Yes Historical Provider, MD  lisinopril (PRINIVIL,ZESTRIL) 20 MG tablet Take 40 mg by mouth daily.  10/31/13  Yes Historical Provider, MD  metFORMIN (GLUCOPHAGE) 1000 MG tablet Take 1,000 mg by mouth 2 (two) times daily. 10/17/13  Yes Historical Provider, MD  ondansetron (ZOFRAN-ODT) 4 MG disintegrating tablet Take 4 mg by mouth every 6 (six) hours as needed. For nausea and vomiting 11/02/13  Yes Historical Provider, MD  simvastatin (ZOCOR) 80 MG tablet Take 80 mg by mouth at bedtime.  10/31/13  Yes Historical Provider, MD  triamterene-hydrochlorothiazide (MAXZIDE-25) 37.5-25 MG per tablet Take 1 tablet by mouth daily. 10/17/13  Yes Historical Provider, MD  albuterol (PROVENTIL HFA;VENTOLIN HFA) 108 (90 BASE) MCG/ACT inhaler Inhale 1-2 puffs into the lungs every 6 (six) hours as needed for wheezing or shortness of breath. 11/04/13   Nat Christen, MD  azithromycin (ZITHROMAX Z-PAK) 250 MG tablet 2 po day one, then 1 daily x 4 days 11/04/13   Nat Christen, MD  LEVEMIR FLEXTOUCH 100 UNIT/ML Pen Inject 45 Units into the skin at bedtime. 09/15/13   Historical  Provider, MD  levofloxacin (LEVAQUIN) 750 MG tablet Take 750 mg by mouth 2 (two) times daily. For 10 days(started 10/29/13)    Historical Provider, MD  lisinopril (PRINIVIL,ZESTRIL) 20 MG tablet Take 40 mg by mouth daily.    Historical Provider, MD  metFORMIN (GLUCOPHAGE) 1000 MG tablet Take 1,000 mg by mouth 2 (two) times daily.    Historical Provider, MD  ondansetron (ZOFRAN ODT) 4 MG disintegrating tablet 4mg  ODT q4 hours prn nausea/vomit 11/01/13   Maudry Diego, MD  ondansetron (ZOFRAN) 8 MG tablet Take 1 tablet (8 mg total) by mouth every 4 (four) hours as needed. 11/04/13   Nat Christen, MD  simvastatin (ZOCOR) 80 MG tablet Take 80 mg by mouth at bedtime.    Historical Provider, MD  triamterene-hydrochlorothiazide (MAXZIDE-25) 37.5-25 MG per tablet Take 1 tablet by mouth daily.    Historical Provider, MD   BP 131/51 mmHg  Pulse 74  Temp(Src) 98.8 F (37.1 C) (Oral)  Resp 18  Ht 5\' 5"  (1.651 m)  Wt 134 lb (60.782 kg)  BMI 22.30 kg/m2  SpO2 94% Physical Exam  Constitutional: She is oriented to person, place, and time.  Alert, slightly dehydrated  HENT:  Head: Normocephalic and atraumatic.  Eyes: Conjunctivae and EOM are normal. Pupils are equal, round, and reactive to light.  Neck: Normal range of motion. Neck supple.  Cardiovascular: Normal rate, regular rhythm and normal heart sounds.   Pulmonary/Chest: Effort normal and breath sounds normal.  Abdominal: Bowel sounds are normal.  Minimal epigastric tenderness  Musculoskeletal: Normal range of motion.  Neurological: She is alert and oriented to person, place, and time.  Skin: Skin is warm and dry.  Psychiatric: She has a normal mood and affect. Her behavior is normal.  Nursing note and vitals reviewed.   ED Course  Procedures (including critical care time) DIAGNOSTIC STUDIES: Oxygen Saturation is 95% on RA, adequate by my interpretation.    COORDINATION OF CARE: 2:53 PM Discussed treatment plan with pt at bedside and  pt agreed to plan.  Labs Review Labs Reviewed  COMPREHENSIVE METABOLIC PANEL - Abnormal; Notable for the following:    Sodium 131 (*)    Potassium 5.4 (*)    Chloride 93 (*)    Glucose, Bld 69 (*)    BUN 25 (*)    Creatinine, Ser 1.52 (*)    Calcium 10.7 (*)    GFR calc non Af Amer 32 (*)    GFR calc Af Amer 38 (*)    All other components within normal limits  CBC WITH DIFFERENTIAL - Abnormal; Notable for the following:    Hemoglobin 10.8 (*)    HCT 34.0 (*)    MCV 76.9 (*)    MCH 24.4 (*)    RDW 16.3 (*)    All other components within normal limits  LIPASE, BLOOD  URINALYSIS, ROUTINE W REFLEX MICROSCOPIC    Imaging Review No results found.   EKG Interpretation None      MDM   Final diagnoses:  Gastroenteritis  Lung mass  URI (upper respiratory infection)   Patient feels much better after IV fluids, IV Zofran, IV Reglan. I discussed CT chest findings with the patient and her daughter, specifically a density in the right middle lobe.. They understand the need for repeat CT scan in 3-6 months.  Discharge medications Zithromax for upper respiratory symptoms, Zofran 8 mg, albuterol inhaler  I personally performed the services described in this documentation, which was scribed in my presence. The recorded information has been reviewed and is accurate.     Nat Christen, MD 11/04/13 2050  Nat Christen, MD 11/04/13 2051  Nat Christen, MD 11/27/13 [phone]

## 2013-11-06 ENCOUNTER — Encounter (HOSPITAL_COMMUNITY): Payer: Self-pay | Admitting: Emergency Medicine

## 2013-11-29 ENCOUNTER — Encounter (HOSPITAL_COMMUNITY): Payer: Self-pay | Admitting: Emergency Medicine

## 2013-11-29 ENCOUNTER — Emergency Department (HOSPITAL_COMMUNITY): Payer: Medicare Other

## 2013-11-29 ENCOUNTER — Emergency Department (HOSPITAL_COMMUNITY)
Admission: EM | Admit: 2013-11-29 | Discharge: 2013-11-29 | Disposition: A | Discharge status: Home / Self Care | Payer: Medicare Other | Attending: Emergency Medicine | Admitting: Emergency Medicine

## 2013-11-29 DIAGNOSIS — R0602 Shortness of breath: Secondary | ICD-10-CM

## 2013-11-29 DIAGNOSIS — I10 Essential (primary) hypertension: Secondary | ICD-10-CM | POA: Insufficient documentation

## 2013-11-29 DIAGNOSIS — Z880 Allergy status to penicillin: Secondary | ICD-10-CM | POA: Diagnosis not present

## 2013-11-29 DIAGNOSIS — J45901 Unspecified asthma with (acute) exacerbation: Secondary | ICD-10-CM | POA: Insufficient documentation

## 2013-11-29 DIAGNOSIS — Z79899 Other long term (current) drug therapy: Secondary | ICD-10-CM | POA: Insufficient documentation

## 2013-11-29 DIAGNOSIS — J9801 Acute bronchospasm: Secondary | ICD-10-CM

## 2013-11-29 DIAGNOSIS — R000 Tachycardia, unspecified: Secondary | ICD-10-CM | POA: Diagnosis not present

## 2013-11-29 DIAGNOSIS — E780 Pure hypercholesterolemia: Secondary | ICD-10-CM | POA: Diagnosis not present

## 2013-11-29 DIAGNOSIS — J40 Bronchitis, not specified as acute or chronic: Secondary | ICD-10-CM

## 2013-11-29 DIAGNOSIS — Z792 Long term (current) use of antibiotics: Secondary | ICD-10-CM | POA: Diagnosis not present

## 2013-11-29 DIAGNOSIS — M1990 Unspecified osteoarthritis, unspecified site: Secondary | ICD-10-CM | POA: Diagnosis not present

## 2013-11-29 DIAGNOSIS — E785 Hyperlipidemia, unspecified: Secondary | ICD-10-CM | POA: Insufficient documentation

## 2013-11-29 DIAGNOSIS — Z794 Long term (current) use of insulin: Secondary | ICD-10-CM | POA: Insufficient documentation

## 2013-11-29 DIAGNOSIS — J219 Acute bronchiolitis, unspecified: Secondary | ICD-10-CM | POA: Insufficient documentation

## 2013-11-29 DIAGNOSIS — E119 Type 2 diabetes mellitus without complications: Secondary | ICD-10-CM | POA: Diagnosis not present

## 2013-11-29 LAB — CBC WITH DIFFERENTIAL/PLATELET
Basophils Absolute: 0 10*3/uL (ref 0.0–0.1)
Basophils Absolute: 0 10*3/uL (ref 0.0–0.1)
Basophils Relative: 0 % (ref 0–1)
Basophils Relative: 0 % (ref 0–1)
Eosinophils Absolute: 0.7 10*3/uL (ref 0.0–0.7)
Eosinophils Absolute: 0 10*3/uL (ref 0.0–0.7)
Eosinophils Relative: 8 % — ABNORMAL HIGH (ref 0–5)
Eosinophils Relative: 0 % (ref 0–5)
HCT: 38.3 % (ref 36.0–46.0)
HCT: 31.2 % — ABNORMAL LOW (ref 36.0–46.0)
Hemoglobin: 12.7 g/dL (ref 12.0–15.0)
Hemoglobin: 9.7 g/dL — ABNORMAL LOW (ref 12.0–15.0)
Lymphocytes Relative: 42 % (ref 12–46)
Lymphocytes Relative: 5 % — ABNORMAL LOW (ref 12–46)
Lymphs Abs: 4 10*3/uL (ref 0.7–4.0)
Lymphs Abs: 0.7 10*3/uL (ref 0.7–4.0)
MCH: 30.9 pg (ref 26.0–34.0)
MCH: 23.9 pg — ABNORMAL LOW (ref 26.0–34.0)
MCHC: 33.2 g/dL (ref 30.0–36.0)
MCHC: 31.1 g/dL (ref 30.0–36.0)
MCV: 93.2 fL (ref 78.0–100.0)
MCV: 76.8 fL — ABNORMAL LOW (ref 78.0–100.0)
Monocytes Absolute: 0.5 10*3/uL (ref 0.1–1.0)
Monocytes Absolute: 0.4 10*3/uL (ref 0.1–1.0)
Monocytes Relative: 5 % (ref 3–12)
Monocytes Relative: 3 % (ref 3–12)
Neutro Abs: 4.3 10*3/uL (ref 1.7–7.7)
Neutro Abs: 12.7 10*3/uL — ABNORMAL HIGH (ref 1.7–7.7)
Neutrophils Relative %: 45 % (ref 43–77)
Neutrophils Relative %: 92 % — ABNORMAL HIGH (ref 43–77)
Platelets: 171 10*3/uL (ref 150–400)
Platelets: 237 10*3/uL (ref 150–400)
RBC: 4.11 MIL/uL (ref 3.87–5.11)
RBC: 4.06 MIL/uL (ref 3.87–5.11)
RDW: 13.8 % (ref 11.5–15.5)
RDW: 16 % — ABNORMAL HIGH (ref 11.5–15.5)
WBC: 9.5 10*3/uL (ref 4.0–10.5)
WBC: 13.8 10*3/uL — ABNORMAL HIGH (ref 4.0–10.5)

## 2013-11-29 LAB — PRO B NATRIURETIC PEPTIDE: Pro B Natriuretic peptide (BNP): 258.2 pg/mL (ref 0–450)

## 2013-11-29 LAB — COMPREHENSIVE METABOLIC PANEL
ALT: 9 U/L (ref 0–35)
AST: 15 U/L (ref 0–37)
Albumin: 3.8 g/dL (ref 3.5–5.2)
Alkaline Phosphatase: 99 U/L (ref 39–117)
Anion gap: 16 — ABNORMAL HIGH (ref 5–15)
BUN: 19 mg/dL (ref 6–23)
CO2: 23 mEq/L (ref 19–32)
Calcium: 10 mg/dL (ref 8.4–10.5)
Chloride: 95 mEq/L — ABNORMAL LOW (ref 96–112)
Creatinine, Ser: 1.05 mg/dL (ref 0.50–1.10)
GFR calc Af Amer: 59 mL/min — ABNORMAL LOW (ref >90)
GFR calc non Af Amer: 51 mL/min — ABNORMAL LOW (ref >90)
Glucose, Bld: 200 mg/dL — ABNORMAL HIGH (ref 70–99)
Potassium: 4.7 mEq/L (ref 3.7–5.3)
Sodium: 134 mEq/L — ABNORMAL LOW (ref 137–147)
Total Bilirubin: 0.4 mg/dL (ref 0.3–1.2)
Total Protein: 7.4 g/dL (ref 6.0–8.3)

## 2013-11-29 LAB — TROPONIN I: Troponin I: <0.3 ng/mL (ref <0.30)

## 2013-11-29 MED ORDER — ALBUTEROL SULFATE (2.5 MG/3ML) 0.083% IN NEBU
2.5000 mg | INHALATION_SOLUTION | Freq: Once | RESPIRATORY_TRACT | Status: AC
  Administered 2013-11-29: 2.5 mg via RESPIRATORY_TRACT
  Filled 2013-11-29: qty 3

## 2013-11-29 MED ORDER — ACETAMINOPHEN 500 MG PO TABS
1000.0000 mg | ORAL_TABLET | Freq: Once | ORAL | Status: AC
  Administered 2013-11-29: 1000 mg via ORAL
  Filled 2013-11-29: qty 2

## 2013-11-29 MED ORDER — IPRATROPIUM-ALBUTEROL 0.5-2.5 (3) MG/3ML IN SOLN
3.0000 mL | Freq: Once | RESPIRATORY_TRACT | Status: AC
  Administered 2013-11-29: 3 mL via RESPIRATORY_TRACT
  Filled 2013-11-29: qty 3

## 2013-11-29 MED ORDER — AZITHROMYCIN 250 MG PO TABS
500.0000 mg | ORAL_TABLET | Freq: Once | ORAL | Status: AC
  Administered 2013-11-29: 500 mg via ORAL
  Filled 2013-11-29: qty 2

## 2013-11-29 MED ORDER — AZITHROMYCIN 250 MG PO TABS: ORAL_TABLET | ORAL | Status: DC

## 2013-11-29 MED ORDER — PREDNISONE 10 MG PO TABS: 20.0000 mg | ORAL_TABLET | Freq: Every day | ORAL | Status: DC

## 2013-11-29 NOTE — ED Notes (Signed)
Respiratory paged for breathing treatment 

## 2013-11-29 NOTE — ED Notes (Signed)
Pt ambulated on room air. O2 sat upon returning to room was 94%, HR-120.

## 2013-11-29 NOTE — ED Provider Notes (Signed)
CSN: [id number]     Arrival date & time 11/29/13  1858 History  This chart was scribed for Maudry Diego, MD by Rayfield Citizen, ED Scribe. This patient was seen in room APA01/APA01 and the patient's care was started at 7:10 PM.    Chief Complaint  Patient presents with  . Shortness of Breath   Patient is a 78 y.o. female presenting with shortness of breath. The history is provided by the patient. No language interpreter was used.  Shortness of Breath Associated symptoms: no abdominal pain, no chest pain, no cough, no headaches and no rash      HPI Comments: Stephanie [name] is a 78 y.o. female with past medical history of asthma who presents to the Emergency Department complaining of SOB. Patient reports one week of nonproductive cough and post-tussive vomiting. She notes that she has difficulty lying down at night due to increased SOB. Her SOB increased yesterday; she was seen by her PCP today and was sent to the ED to be evaluated for her symptoms. Patient went home and called EMS.   She denies a history of COPD. She utilizes nebulizer treatments at home for her asthma.    Past Medical History  Diagnosis Date  . Asthma   . Diabetes mellitus   . Hypercholesteremia   . Diabetes mellitus without complication   . Arthritis   . Hypertension   . Hyperlipemia    Past Surgical History  Procedure Laterality Date  . Cyst removal hand      Right, hospital in [place]  . Knee arthroscopy with medial menisectomy Right 02/28/2012    Procedure: KNEE ARTHROSCOPY WITH MEDIAL MENISECTOMY;  Surgeon: Carole Civil, MD;  Location: [facility];  Service: Orthopedics;  Laterality: Right;  . Repair knee ligament     Family History  Problem Relation Age of Onset  . Lung disease    . Cancer    . Arthritis    . Asthma    . Diabetes     History  Substance Use Topics  . Smoking status: Never Smoker   . Smokeless tobacco: Never Used  . Alcohol Use: No   OB History    No data available     Review  of Systems  Constitutional: Negative for appetite change and fatigue.  HENT: Negative for congestion, ear discharge and sinus pressure.   Eyes: Negative for discharge.  Respiratory: Positive for shortness of breath. Negative for cough.   Cardiovascular: Negative for chest pain.  Gastrointestinal: Negative for abdominal pain and diarrhea.  Genitourinary: Negative for frequency and hematuria.  Musculoskeletal: Negative for back pain.  Skin: Negative for rash.  Neurological: Negative for seizures and headaches.  Psychiatric/Behavioral: Negative for hallucinations.      Allergies  Amoxicillin; Amoxicillin; Penicillins; Sulfa antibiotics; and Sulfa antibiotics  Home Medications   Prior to Admission medications   Medication Sig Start Date End Date Taking? Authorizing Provider  ADVAIR HFA 115-21 MCG/ACT inhaler Inhale 1 puff into the lungs every 12 (twelve) hours. 08/24/13   Historical Provider, MD  albuterol (PROVENTIL HFA;VENTOLIN HFA) 108 (90 BASE) MCG/ACT inhaler Inhale 1-2 puffs into the lungs every 6 (six) hours as needed for wheezing or shortness of breath. 11/04/13   Nat Christen, MD  aspirin EC 325 MG tablet Take 325 mg by mouth daily.    Historical Provider, MD  azithromycin (ZITHROMAX Z-PAK) 250 MG tablet 2 po day one, then 1 daily x 4 days 11/04/13   Nat Christen, MD  cholecalciferol (  VITAMIN D) 1000 UNITS tablet Take 1,000 Units by mouth daily.    Historical Provider, MD  LEVEMIR FLEXTOUCH 100 UNIT/ML Pen Inject 45 Units into the skin at bedtime. 09/15/13   Historical Provider, MD  LEVEMIR FLEXTOUCH 100 UNIT/ML Pen Inject 45 Units into the skin at bedtime.  11/03/13   Historical Provider, MD  levofloxacin (LEVAQUIN) 750 MG tablet Take 750 mg by mouth 2 (two) times daily. For 10 days(started 10/29/13)    Historical Provider, MD  levofloxacin (LEVAQUIN) 750 MG tablet Take 750 mg by mouth 2 (two) times daily. 7 day course starting on 10/29/2013 10/29/13   Historical Provider, MD   lisinopril (PRINIVIL,ZESTRIL) 20 MG tablet Take 40 mg by mouth daily.    Historical Provider, MD  lisinopril (PRINIVIL,ZESTRIL) 20 MG tablet Take 40 mg by mouth daily.  10/31/13   Historical Provider, MD  metFORMIN (GLUCOPHAGE) 1000 MG tablet Take 1,000 mg by mouth 2 (two) times daily.    Historical Provider, MD  metFORMIN (GLUCOPHAGE) 1000 MG tablet Take 1,000 mg by mouth 2 (two) times daily. 10/17/13   Historical Provider, MD  ondansetron (ZOFRAN ODT) 4 MG disintegrating tablet 4mg  ODT q4 hours prn nausea/vomit 11/01/13   Maudry Diego, MD  ondansetron (ZOFRAN) 8 MG tablet Take 1 tablet (8 mg total) by mouth every 4 (four) hours as needed. 11/04/13   Nat Christen, MD  ondansetron (ZOFRAN-ODT) 4 MG disintegrating tablet Take 4 mg by mouth every 6 (six) hours as needed. For nausea and vomiting 11/02/13   Historical Provider, MD  simvastatin (ZOCOR) 80 MG tablet Take 80 mg by mouth at bedtime.    Historical Provider, MD  simvastatin (ZOCOR) 80 MG tablet Take 80 mg by mouth at bedtime.  10/31/13   Historical Provider, MD  triamterene-hydrochlorothiazide (MAXZIDE-25) 37.5-25 MG per tablet Take 1 tablet by mouth daily.    Historical Provider, MD  triamterene-hydrochlorothiazide (MAXZIDE-25) 37.5-25 MG per tablet Take 1 tablet by mouth daily. 10/17/13   Historical Provider, MD   BP 147/70 mmHg  Pulse 132  Temp(Src) 102.1 F (38.9 C) (Oral)  Wt 152 lb (68.947 kg)  SpO2 100% Physical Exam  Constitutional: She is oriented to person, place, and time. She appears well-developed.  HENT:  Head: Normocephalic.  Eyes: Conjunctivae and EOM are normal. No scleral icterus.  Neck: Neck supple. No thyromegaly present.  Cardiovascular: Regular rhythm.  Exam reveals no gallop and no friction rub.   No murmur heard. Tachycardic  Pulmonary/Chest: No stridor. She has wheezes. She has no rales. She exhibits no tenderness.  Mild wheezing bilaterally  Abdominal: She exhibits no distension. There is no tenderness.  There is no rebound.  Musculoskeletal: Normal range of motion. She exhibits no edema.  Lymphadenopathy:    She has no cervical adenopathy.  Neurological: She is oriented to person, place, and time. She exhibits normal muscle tone. Coordination normal.  Skin: No rash noted. No erythema.  Psychiatric: She has a normal mood and affect. Her behavior is normal.    ED Course  Procedures   DIAGNOSTIC STUDIES: Oxygen Saturation is 100% on [place] (2L/min), normal by my interpretation.    COORDINATION OF CARE: 7:14 PM Discussed treatment plan with pt at bedside and pt agreed to plan.   Labs Review Labs Reviewed - No data to display  Imaging Review No results found.   EKG Interpretation None      MDM   Final diagnoses:  SOB (shortness of breath)    The chart was scribed for  me under my direct supervision.  I personally performed the history, physical, and medical decision making and all procedures in the evaluation of this patient.Maudry Diego, MD 12/01/13 [phone]

## 2013-11-29 NOTE — ED Notes (Signed)
Pt ambulated to bathroom with stand-by assist. Nad noted at present.

## 2013-11-29 NOTE — Discharge Instructions (Signed)
Follow up with your md next week. °

## 2013-11-29 NOTE — ED Notes (Signed)
Per EMS: pt was seen by PCP today and they wanted her to come to the hospital to be evaluated for SOB and cough. Pt went home and called EMS. EMS administered 5mg  of albuterol, 0.5mg  of Atrovent, and 125mg  solumedrol. CGB 137 Pt reporting poor appetite the last couple of days and a nonproductive cough.

## 2014-04-25 ENCOUNTER — Encounter (HOSPITAL_COMMUNITY): Payer: Self-pay | Admitting: *Deleted

## 2014-04-25 ENCOUNTER — Emergency Department (HOSPITAL_COMMUNITY): Payer: Medicare Other

## 2014-04-25 ENCOUNTER — Emergency Department (HOSPITAL_COMMUNITY)
Admission: EM | Admit: 2014-04-25 | Discharge: 2014-04-25 | Disposition: A | Discharge status: Home / Self Care | Payer: Medicare Other | Attending: Emergency Medicine | Admitting: Emergency Medicine

## 2014-04-25 DIAGNOSIS — Z79899 Other long term (current) drug therapy: Secondary | ICD-10-CM | POA: Insufficient documentation

## 2014-04-25 DIAGNOSIS — E780 Pure hypercholesterolemia: Secondary | ICD-10-CM | POA: Diagnosis not present

## 2014-04-25 DIAGNOSIS — Z792 Long term (current) use of antibiotics: Secondary | ICD-10-CM | POA: Diagnosis not present

## 2014-04-25 DIAGNOSIS — K5900 Constipation, unspecified: Secondary | ICD-10-CM | POA: Diagnosis not present

## 2014-04-25 DIAGNOSIS — R1031 Right lower quadrant pain: Secondary | ICD-10-CM

## 2014-04-25 DIAGNOSIS — E119 Type 2 diabetes mellitus without complications: Secondary | ICD-10-CM | POA: Diagnosis not present

## 2014-04-25 DIAGNOSIS — M1990 Unspecified osteoarthritis, unspecified site: Secondary | ICD-10-CM | POA: Diagnosis not present

## 2014-04-25 DIAGNOSIS — J45909 Unspecified asthma, uncomplicated: Secondary | ICD-10-CM | POA: Insufficient documentation

## 2014-04-25 DIAGNOSIS — I10 Essential (primary) hypertension: Secondary | ICD-10-CM | POA: Insufficient documentation

## 2014-04-25 DIAGNOSIS — Z7982 Long term (current) use of aspirin: Secondary | ICD-10-CM | POA: Diagnosis not present

## 2014-04-25 DIAGNOSIS — Z880 Allergy status to penicillin: Secondary | ICD-10-CM | POA: Insufficient documentation

## 2014-04-25 DIAGNOSIS — Z794 Long term (current) use of insulin: Secondary | ICD-10-CM | POA: Diagnosis not present

## 2014-04-25 LAB — CBC WITH DIFFERENTIAL/PLATELET
Basophils Absolute: 0 10*3/uL (ref 0.0–0.1)
Basophils Relative: 0 % (ref 0–1)
Eosinophils Absolute: 0.2 10*3/uL (ref 0.0–0.7)
Eosinophils Relative: 1 % (ref 0–5)
HCT: 30.1 % — ABNORMAL LOW (ref 36.0–46.0)
Hemoglobin: 8.7 g/dL — ABNORMAL LOW (ref 12.0–15.0)
Lymphocytes Relative: 9 % — ABNORMAL LOW (ref 12–46)
Lymphs Abs: 1.3 10*3/uL (ref 0.7–4.0)
MCH: 19.7 pg — ABNORMAL LOW (ref 26.0–34.0)
MCHC: 28.9 g/dL — ABNORMAL LOW (ref 30.0–36.0)
MCV: 68.3 fL — ABNORMAL LOW (ref 78.0–100.0)
Monocytes Absolute: 1.4 10*3/uL — ABNORMAL HIGH (ref 0.1–1.0)
Monocytes Relative: 9 % (ref 3–12)
Neutro Abs: 12.7 10*3/uL — ABNORMAL HIGH (ref 1.7–7.7)
Neutrophils Relative %: 81 % — ABNORMAL HIGH (ref 43–77)
Platelets: 377 10*3/uL (ref 150–400)
RBC: 4.41 MIL/uL (ref 3.87–5.11)
RDW: 21.3 % — ABNORMAL HIGH (ref 11.5–15.5)
WBC: 15.7 10*3/uL — ABNORMAL HIGH (ref 4.0–10.5)

## 2014-04-25 LAB — URINALYSIS, ROUTINE W REFLEX MICROSCOPIC
Bilirubin Urine: NEGATIVE
Glucose, UA: NEGATIVE mg/dL
Hgb urine dipstick: NEGATIVE
Ketones, ur: NEGATIVE mg/dL
Nitrite: NEGATIVE
Protein, ur: NEGATIVE mg/dL
Specific Gravity, Urine: 1.02 (ref 1.005–1.030)
Urobilinogen, UA: 0.2 mg/dL (ref 0.0–1.0)
pH: 5.5 (ref 5.0–8.0)

## 2014-04-25 LAB — COMPREHENSIVE METABOLIC PANEL
ALT: 11 U/L (ref 0–35)
AST: 16 U/L (ref 0–37)
Albumin: 3.6 g/dL (ref 3.5–5.2)
Alkaline Phosphatase: 89 U/L (ref 39–117)
Anion gap: 9 (ref 5–15)
BUN: 18 mg/dL (ref 6–23)
CO2: 25 mmol/L (ref 19–32)
Calcium: 9.7 mg/dL (ref 8.4–10.5)
Chloride: 99 mmol/L (ref 96–112)
Creatinine, Ser: 1.17 mg/dL — ABNORMAL HIGH (ref 0.50–1.10)
GFR calc Af Amer: 51 mL/min — ABNORMAL LOW (ref >90)
GFR calc non Af Amer: 44 mL/min — ABNORMAL LOW (ref >90)
Glucose, Bld: 205 mg/dL — ABNORMAL HIGH (ref 70–99)
Potassium: 4.5 mmol/L (ref 3.5–5.1)
Sodium: 133 mmol/L — ABNORMAL LOW (ref 135–145)
Total Bilirubin: 0.7 mg/dL (ref 0.3–1.2)
Total Protein: 7.6 g/dL (ref 6.0–8.3)

## 2014-04-25 LAB — URINE MICROSCOPIC-ADD ON

## 2014-04-25 LAB — LIPASE, BLOOD: Lipase: 35 U/L (ref 11–59)

## 2014-04-25 MED ORDER — ALBUTEROL SULFATE (2.5 MG/3ML) 0.083% IN NEBU
2.5000 mg | INHALATION_SOLUTION | Freq: Once | RESPIRATORY_TRACT | Status: AC
  Administered 2014-04-25: 2.5 mg via RESPIRATORY_TRACT
  Filled 2014-04-25: qty 3

## 2014-04-25 MED ORDER — SODIUM CHLORIDE 0.9 % IV BOLUS (SEPSIS)
500.0000 mL | Freq: Once | INTRAVENOUS | Status: AC
  Administered 2014-04-25: 500 mL via INTRAVENOUS

## 2014-04-25 NOTE — ED Notes (Signed)
Pt comes in with RLQ pain that started 7 days ago. Pt states she is having urinary frequency that started then as well. Pt is in NAD.

## 2014-04-25 NOTE — ED Provider Notes (Signed)
CSN: [id number]     Arrival date & time 04/25/14  0917 History  This chart was scribed for Nat Christen, MD by Donato Schultz, ED Scribe. This patient was seen in room APA09/APA09 and the patient's care was started at 9:32 AM.    Chief Complaint  Patient presents with  . Abdominal Pain   The history is provided by the patient. No language interpreter was used.   HPI Comments: Stephanie [name] is a 79 y.o. female who presents to the Emergency Department complaining of constant RLQ pain that started a week ago.  The pain makes it difficult for her to walk.  She has been drinking fluids normally.  The patient's daughter lists urinary frequency as an associated symptom.  She does not have a history of urinary infections.  She denies chest pain, SOB, vomiting, constipation, diarrhea, dysuria, and decreased appetite as associated symptoms.  She is a regular patient at [facility].  She was last seen at the practice last week and diagnosed with bronchitis.    Past Medical History  Diagnosis Date  . Asthma   . Diabetes mellitus   . Hypercholesteremia   . Diabetes mellitus without complication   . Arthritis   . Hypertension   . Hyperlipemia    Past Surgical History  Procedure Laterality Date  . Cyst removal hand      Right, hospital in [place]  . Knee arthroscopy with medial menisectomy Right 02/28/2012    Procedure: KNEE ARTHROSCOPY WITH MEDIAL MENISECTOMY;  Surgeon: Carole Civil, MD;  Location: [facility];  Service: Orthopedics;  Laterality: Right;  . Repair knee ligament    . Appendectomy     Family History  Problem Relation Age of Onset  . Lung disease    . Cancer    . Arthritis    . Asthma    . Diabetes     History  Substance Use Topics  . Smoking status: Never Smoker   . Smokeless tobacco: Never Used  . Alcohol Use: No   OB History    No data available     Review of Systems  Constitutional: Negative for appetite change.  Respiratory: Negative for shortness of  breath.   Cardiovascular: Negative for chest pain.  Gastrointestinal: Positive for abdominal pain. Negative for vomiting, diarrhea and constipation.  Genitourinary: Negative for dysuria.  All other systems reviewed and are negative.   Allergies  Amoxicillin; Amoxicillin; Penicillins; Sulfa antibiotics; and Sulfa antibiotics  Home Medications   Prior to Admission medications   Medication Sig Start Date End Date Taking? Authorizing Provider  ADVAIR HFA 115-21 MCG/ACT inhaler Inhale 1 puff into the lungs every 12 (twelve) hours. 08/24/13  Yes Historical Provider, MD  albuterol (PROVENTIL) (2.5 MG/3ML) 0.083% nebulizer solution Take 2.5 mg by nebulization every 6 (six) hours as needed for wheezing or shortness of breath.   Yes Historical Provider, MD  aspirin EC 325 MG tablet Take 325 mg by mouth daily.   Yes Historical Provider, MD  azithromycin (ZITHROMAX) 250 MG tablet Take one tablet a day 11/29/13  Yes Milton Ferguson, MD  cholecalciferol (VITAMIN D) 1000 UNITS tablet Take 1,000 Units by mouth daily.   Yes Historical Provider, MD  LEVEMIR FLEXTOUCH 100 UNIT/ML Pen Inject 45 Units into the skin at bedtime. 09/15/13  Yes Historical Provider, MD  lisinopril (PRINIVIL,ZESTRIL) 20 MG tablet Take 40 mg by mouth daily.   Yes Historical Provider, MD  metFORMIN (GLUCOPHAGE) 1000 MG tablet Take 1,000 mg by mouth 2 (  two) times daily.   Yes Historical Provider, MD  simvastatin (ZOCOR) 80 MG tablet Take 80 mg by mouth at bedtime.   Yes Historical Provider, MD  triamterene-hydrochlorothiazide (MAXZIDE-25) 37.5-25 MG per tablet Take 1 tablet by mouth daily.   Yes Historical Provider, MD  ondansetron (ZOFRAN ODT) 4 MG disintegrating tablet 4mg  ODT q4 hours prn nausea/vomit Patient not taking: Reported on 04/25/2014 11/01/13   Milton Ferguson, MD  ondansetron (ZOFRAN) 8 MG tablet Take 1 tablet (8 mg total) by mouth every 4 (four) hours as needed. Patient not taking: Reported on 04/25/2014 11/04/13   Nat Christen, MD   predniSONE (DELTASONE) 10 MG tablet Take 2 tablets (20 mg total) by mouth daily. Patient not taking: Reported on 04/25/2014 11/29/13   Milton Ferguson, MD   Triage Vitals: BP 139/100 mmHg  Pulse 109  Temp(Src) 98.8 F (37.1 C) (Oral)  Resp 16  Wt 152 lb (68.947 kg)  SpO2 97%  Physical Exam  Constitutional: She is oriented to person, place, and time. She appears well-developed and well-nourished.  HENT:  Head: Normocephalic and atraumatic.  Eyes: Conjunctivae and EOM are normal. Pupils are equal, round, and reactive to light.  Neck: Normal range of motion. Neck supple.  Cardiovascular: Normal rate and regular rhythm.   Pulmonary/Chest: Effort normal and breath sounds normal.  Abdominal: Soft. Bowel sounds are normal. There is tenderness.  Minimal RLQ tenderness.   Musculoskeletal: Normal range of motion.  Neurological: She is alert and oriented to person, place, and time.  Skin: Skin is warm and dry.  Psychiatric: She has a normal mood and affect. Her behavior is normal.  Nursing note and vitals reviewed.   ED Course  Procedures (including critical care time)  DIAGNOSTIC STUDIES: Oxygen Saturation is 97% on room air, normal by my interpretation.    COORDINATION OF CARE: 9:36 AM- Discussed a clinical suspicion of a urine infection with the patient.  Will check a UA and the patient agreed to the treatment plan.  Labs Review Labs Reviewed  URINALYSIS, ROUTINE W REFLEX MICROSCOPIC - Abnormal; Notable for the following:    Leukocytes, UA TRACE (*)    All other components within normal limits  COMPREHENSIVE METABOLIC PANEL - Abnormal; Notable for the following:    Sodium 133 (*)    Glucose, Bld 205 (*)    Creatinine, Ser 1.17 (*)    GFR calc non Af Amer 44 (*)    GFR calc Af Amer 51 (*)    All other components within normal limits  CBC WITH DIFFERENTIAL/PLATELET - Abnormal; Notable for the following:    WBC 15.7 (*)    Hemoglobin 8.7 (*)    HCT 30.1 (*)    MCV 68.3 (*)     MCH 19.7 (*)    MCHC 28.9 (*)    RDW 21.3 (*)    Neutrophils Relative % 81 (*)    Neutro Abs 12.7 (*)    Lymphocytes Relative 9 (*)    Monocytes Absolute 1.4 (*)    All other components within normal limits  URINE MICROSCOPIC-ADD ON  LIPASE, BLOOD    Imaging Review Dg Abd Acute W/chest  04/25/2014   CLINICAL DATA:  Right lower quadrant pain for 1 week. No known injury.  EXAM: DG ABDOMEN ACUTE W/ 1V CHEST  COMPARISON:  Chest in two views abdomen 11/01/2013.  FINDINGS: Single view of the chest demonstrates mild cardiomegaly without edema. The lungs are clear. No pneumothorax or pleural effusion.  Two views of the abdomen  show no free intraperitoneal air. The bowel gas pattern is nonobstructive. There is a massive volume of stool throughout the colon.  IMPRESSION: Severe constipation.  No acute abnormality.   Electronically Signed   By: Inge Rise M.D.   On: 04/25/2014 11:42     EKG Interpretation None      MDM   Final diagnoses:  RLQ abdominal pain  Constipation, unspecified constipation type    Patient has normal vital signs. No acute abdomen. Abdominal series revealed severe constipation. These findings were discussed with the patient and her daughter. Recommend Dulcolax, magnesium citrate, Miralax, fleets enema.  I personally performed the services described in this documentation, which was scribed in my presence. The recorded information has been reviewed and is accurate.    Nat Christen, MD 04/25/14 1327

## 2014-04-25 NOTE — Discharge Instructions (Signed)
X-ray shows excessive stool in your colon. Recommend Dulcolax, magnesium citrate, Miralax, fleets enema.  Increase fluids, fruits, fibers. Follow-up your primary care doctor or return if worse

## 2016-10-12 LAB — HEMOGLOBIN A1C: Hemoglobin A1C: 10.5

## 2016-11-15 ENCOUNTER — Ambulatory Visit: Payer: Self-pay | Admitting: "Endocrinology

## 2017-01-05 LAB — HEMOGLOBIN A1C: Hemoglobin A1C: 11

## 2017-06-06 LAB — HEMOGLOBIN A1C: Hemoglobin A1C: 12

## 2017-06-06 LAB — BASIC METABOLIC PANEL
BUN: 18 (ref 4–21)
Creatinine: 1 (ref <=1.1)

## 2017-07-06 ENCOUNTER — Encounter: Payer: Self-pay | Admitting: Nutrition

## 2017-07-06 ENCOUNTER — Encounter: Payer: Self-pay | Admitting: "Endocrinology

## 2017-07-06 ENCOUNTER — Encounter: Payer: Medicare HMO | Attending: Internal Medicine | Admitting: Nutrition

## 2017-07-06 ENCOUNTER — Ambulatory Visit (INDEPENDENT_AMBULATORY_CARE_PROVIDER_SITE_OTHER): Payer: Medicare HMO | Admitting: "Endocrinology

## 2017-07-06 VITALS — BP 140/70 | HR 52 | Ht 64.75 in | Wt 216.0 lb

## 2017-07-06 VITALS — Wt 216.0 lb

## 2017-07-06 DIAGNOSIS — Z6836 Body mass index (BMI) 36.0-36.9, adult: Secondary | ICD-10-CM

## 2017-07-06 DIAGNOSIS — E1165 Type 2 diabetes mellitus with hyperglycemia: Secondary | ICD-10-CM

## 2017-07-06 DIAGNOSIS — E1169 Type 2 diabetes mellitus with other specified complication: Secondary | ICD-10-CM | POA: Insufficient documentation

## 2017-07-06 DIAGNOSIS — I10 Essential (primary) hypertension: Secondary | ICD-10-CM | POA: Diagnosis not present

## 2017-07-06 DIAGNOSIS — IMO0002 Reserved for concepts with insufficient information to code with codable children: Secondary | ICD-10-CM

## 2017-07-06 DIAGNOSIS — E66812 Obesity, class 2: Secondary | ICD-10-CM | POA: Insufficient documentation

## 2017-07-06 DIAGNOSIS — Z713 Dietary counseling and surveillance: Secondary | ICD-10-CM | POA: Diagnosis not present

## 2017-07-06 DIAGNOSIS — E119 Type 2 diabetes mellitus without complications: Secondary | ICD-10-CM | POA: Diagnosis not present

## 2017-07-06 DIAGNOSIS — E1122 Type 2 diabetes mellitus with diabetic chronic kidney disease: Secondary | ICD-10-CM | POA: Insufficient documentation

## 2017-07-06 DIAGNOSIS — E782 Mixed hyperlipidemia: Secondary | ICD-10-CM | POA: Diagnosis not present

## 2017-07-06 DIAGNOSIS — E118 Type 2 diabetes mellitus with unspecified complications: Secondary | ICD-10-CM

## 2017-07-06 NOTE — Patient Instructions (Signed)

## 2017-07-06 NOTE — Progress Notes (Signed)
  Medical Nutrition Therapy:  Appt start time: 1030 end time:  1100.  Assessment:  Primary concerns today: Diabetes Type 2, Obesity. Walk in. Saw Dr. Dorris Fetch today.  Will answer safety questions next visit. 60 units of Levemir daily and Metformin 500 mg BID. Admits to snacking and drinking sodas.   Preferred Learning Style:   No preference indicated   Learning Readiness:   Ready  Change in progress   MEDICATIONS: See list   DIETARY INTAKE:  Eats 2-3 meals per day. Snacks often. Skips lunch usually or breakfast. Drinks sodas.  Usual physical activity: adl  Estimated energy needs: 1200  calories 135 g carbohydrates 90 g protein 33 g fat  Progress Towards Goal(s):  In progress.   Nutritional Diagnosis:  NB-1.1 Food and nutrition-related knowledge deficit As related to Diabetes Type 2.  As evidenced by A1C > 11%.    Intervention:  Nutrition and Diabetes education provided on My Plate, CHO counting, meal planning, portion sizes, timing of meals, avoiding snacks between meals unless having a low blood sugar, target ranges for A1C and blood sugars, signs/symptoms and treatment of hyper/hypoglycemia, monitoring blood sugars, taking medications as prescribed, benefits of exercising 30 minutes per day and prevention of complications of DM.  Goals 1 Follow My Plate 2 Cut out sodas, juices, sweets and junk food 3. Eat 2 vegetables with lunch and dinner daily. 4. Drink only water 5 Cut out snacks between meals Take 60 units of Levemir and 500 mg of Metformin twice a day   Teaching Method Utilized:  Visual Auditory Hands on  Handouts given during visit include:  The Plate Method  Meal PLan Card   Barriers to learning/adherence to lifestyle change: none  Demonstrated degree of understanding via:  Teach Back   Monitoring/Evaluation:  Dietary intake, exercise, meal planning, and body weight in 1 month(s).

## 2017-07-06 NOTE — Patient Instructions (Signed)
Goals 1 Follow My Plate 2 Cut out sodas, juices, sweets and junk food 3. Eat 2 vegetables with lunch and dinner daily. 4. Drink only water 5 Cut out snacks between meals Take 60 units of Levemir and 500 mg of Metformin twice a day

## 2017-07-06 NOTE — Progress Notes (Signed)
Endocrinology Consult Note       07/06/2017, 12:16 PM   Subjective:    Patient ID: Stephanie [name], female    DOB: 04/17/1935.  Stephanie [name] is being seen in consultation for management of currently uncontrolled symptomatic diabetes requested by  Abran Richard, MD.   Past Medical History:  Diagnosis Date  . Arthritis   . Asthma   . Diabetes mellitus   . Diabetes mellitus without complication ([place])   . Hypercholesteremia   . Hyperlipemia   . Hypertension    Past Surgical History:  Procedure Laterality Date  . APPENDECTOMY    . CYST REMOVAL HAND     Right, hospital in [place] ARTHROSCOPY WITH MEDIAL MENISECTOMY Right 02/28/2012   Procedure: KNEE ARTHROSCOPY WITH MEDIAL MENISECTOMY;  Surgeon: Carole Civil, MD;  Location: [facility];  Service: Orthopedics;  Laterality: Right;  . REPAIR KNEE LIGAMENT     Social History   Socioeconomic History  . Marital status: Divorced    Spouse name: Not on file  . Number of children: Not on file  . Years of education: Not on file  . Highest education level: Not on file  Occupational History  . Not on file  Social Needs  . Financial resource strain: Not on file  . Food insecurity:    Worry: Not on file    Inability: Not on file  . Transportation needs:    Medical: Not on file    Non-medical: Not on file  Tobacco Use  . Smoking status: Never Smoker  . Smokeless tobacco: Never Used  Substance and Sexual Activity  . Alcohol use: No  . Drug use: No  . Sexual activity: Never  Lifestyle  . Physical activity:    Days per week: Not on file    Minutes per session: Not on file  . Stress: Not on file  Relationships  . Social connections:    Talks on phone: Not on file    Gets together: Not on file    Attends religious service: Not on file    Active member of club or organization: Not on file    Attends meetings of clubs or organizations: Not  on file    Relationship status: Not on file  Other Topics Concern  . Not on file  Social History Narrative   ** Merged History Encounter **       Outpatient Encounter Medications as of 07/06/2017  Medication Sig  . atorvastatin (LIPITOR) 20 MG tablet Take 20 mg by mouth daily.  . metFORMIN (GLUCOPHAGE) 500 MG tablet Take 500 mg by mouth 2 (two) times daily with a meal.  . LEVEMIR FLEXTOUCH 100 UNIT/ML Pen Inject 60 Units into the skin at bedtime.  Marland Kitchen lisinopril (PRINIVIL,ZESTRIL) 20 MG tablet Take 40 mg by mouth daily.  Marland Kitchen triamterene-hydrochlorothiazide (MAXZIDE-25) 37.5-25 MG per tablet Take 1 tablet by mouth daily.  . [DISCONTINUED] ADVAIR HFA 390-30 MCG/ACT inhaler Inhale 1 puff into the lungs every 12 (twelve) hours.  . [DISCONTINUED] albuterol (PROVENTIL) (2.5 MG/3ML) 0.083% nebulizer solution Take 2.5 mg by nebulization every 6 (six) hours as needed for  wheezing or shortness of breath.  . [DISCONTINUED] aspirin EC 325 MG tablet Take 325 mg by mouth daily.  . [DISCONTINUED] azithromycin (ZITHROMAX) 250 MG tablet Take one tablet a day  . [DISCONTINUED] cholecalciferol (VITAMIN D) 1000 UNITS tablet Take 1,000 Units by mouth daily.  . [DISCONTINUED] metFORMIN (GLUCOPHAGE) 1000 MG tablet Take 1,000 mg by mouth 2 (two) times daily.  . [DISCONTINUED] ondansetron (ZOFRAN ODT) 4 MG disintegrating tablet 4mg  ODT q4 hours prn nausea/vomit (Patient not taking: Reported on 04/25/2014)  . [DISCONTINUED] ondansetron (ZOFRAN) 8 MG tablet Take 1 tablet (8 mg total) by mouth every 4 (four) hours as needed. (Patient not taking: Reported on 04/25/2014)  . [DISCONTINUED] predniSONE (DELTASONE) 10 MG tablet Take 2 tablets (20 mg total) by mouth daily. (Patient not taking: Reported on 04/25/2014)  . [DISCONTINUED] simvastatin (ZOCOR) 80 MG tablet Take 80 mg by mouth at bedtime.   No facility-administered encounter medications on file as of 07/06/2017.     ALLERGIES: Allergies  Allergen Reactions  . Amoxicillin  Swelling  . Amoxicillin Swelling    Tongue swelling   . Penicillins Swelling  . Sulfa Antibiotics Swelling  . Sulfa Antibiotics Swelling    Lip swelling    VACCINATION STATUS:  There is no immunization history on file for this patient.  Diabetes  She presents for her initial diabetic visit. She has type 2 diabetes mellitus. Onset time: She was diagnosed at approximate age of 51 years. Her disease course has been worsening. There are no hypoglycemic associated symptoms. Pertinent negatives for hypoglycemia include no confusion, headaches, pallor or seizures. Associated symptoms include fatigue, polydipsia and polyuria. Pertinent negatives for diabetes include no chest pain and no polyphagia. There are no hypoglycemic complications. Symptoms are worsening. There are no diabetic complications. Risk factors for coronary artery disease include diabetes mellitus, dyslipidemia, hypertension, obesity, post-menopausal and sedentary lifestyle. Current diabetic treatment includes insulin injections (She is currently on Levemir 58 units nightly, metformin 1000 mg p.o. twice daily.). Her weight is increasing steadily. She is following a generally unhealthy diet. When asked about meal planning, she reported none. She has had a previous visit with a dietitian. She never participates in exercise. (She did not bring any meter nor logs to review today.  Her most recent A1c was 12% on Jun 06, 2017.  Her previous A1c measurements were 11% and 10.5%.) An ACE inhibitor/angiotensin II receptor blocker is being taken. Eye exam is current.  Hyperlipidemia  This is a chronic problem. The current episode started more than 1 year ago. Exacerbating diseases include diabetes and obesity. Pertinent negatives include no chest pain, myalgias or shortness of breath. Current antihyperlipidemic treatment includes statins. Risk factors for coronary artery disease include dyslipidemia, diabetes mellitus, hypertension, obesity,  post-menopausal and a sedentary lifestyle.  Hypertension  This is a chronic problem. The current episode started more than 1 year ago. The problem is controlled. Pertinent negatives include no chest pain, headaches, palpitations or shortness of breath. Risk factors for coronary artery disease include diabetes mellitus, dyslipidemia, obesity, post-menopausal state and sedentary lifestyle. Past treatments include ACE inhibitors.      Review of Systems  Constitutional: Positive for fatigue. Negative for chills, fever and unexpected weight change.  HENT: Negative for trouble swallowing and voice change.   Eyes: Negative for visual disturbance.  Respiratory: Negative for cough, shortness of breath and wheezing.   Cardiovascular: Negative for chest pain, palpitations and leg swelling.  Gastrointestinal: Negative for diarrhea, nausea and vomiting.  Endocrine: Positive for polydipsia and  polyuria. Negative for cold intolerance, heat intolerance and polyphagia.  Musculoskeletal: Negative for arthralgias and myalgias.  Skin: Negative for color change, pallor, rash and wound.  Neurological: Negative for seizures and headaches.  Psychiatric/Behavioral: Negative for confusion and suicidal ideas.    Objective:    BP 140/70   Pulse (!) 52   Ht 5' 4.75" (1.645 m)   Wt 216 lb (98 kg)   BMI 36.22 kg/m   Wt Readings from Last 3 Encounters:  07/06/17 216 lb (98 kg)  07/06/17 216 lb (98 kg)  04/25/14 152 lb (68.9 kg)     Physical Exam  Constitutional: She is oriented to person, place, and time. She appears well-developed.  HENT:  Head: Normocephalic and atraumatic.  Eyes: EOM are normal.  Neck: Normal range of motion. Neck supple. No tracheal deviation present. No thyromegaly present.  Cardiovascular: Normal rate and regular rhythm.  Pulmonary/Chest: Effort normal and breath sounds normal.  Abdominal: Soft. Bowel sounds are normal. There is no tenderness. There is no guarding.   Musculoskeletal: Normal range of motion. She exhibits no edema.  Neurological: She is alert and oriented to person, place, and time. She has normal reflexes. No cranial nerve deficit. Coordination normal.  Skin: Skin is warm and dry. No rash noted. No erythema. No pallor.  Psychiatric: She has a normal mood and affect. Judgment normal.      CMP ( most recent) CMP     Component Value Date/Time   NA 133 (L) 04/25/2014 1037   K 4.5 04/25/2014 1037   CL 99 04/25/2014 1037   CO2 25 04/25/2014 1037   GLUCOSE 205 (H) 04/25/2014 1037   BUN 18 06/06/2017   CREATININE 1.0 06/06/2017   CREATININE 1.17 (H) 04/25/2014 1037   CALCIUM 9.7 04/25/2014 1037   PROT 7.6 04/25/2014 1037   ALBUMIN 3.6 04/25/2014 1037   AST 16 04/25/2014 1037   ALT 11 04/25/2014 1037   ALKPHOS 89 04/25/2014 1037   BILITOT 0.7 04/25/2014 1037   GFRNONAA 44 (L) 04/25/2014 1037   GFRAA 51 (L) 04/25/2014 1037    Recent Results (from the past 2160 hour(s))  Basic metabolic panel     Status: None   Collection Time: 06/06/17 12:00 AM  Result Value Ref Range   BUN 18 4 - 21   Creatinine 1.0 0.5 - 1.1  Hemoglobin A1c     Status: None   Collection Time: 06/06/17 12:00 AM  Result Value Ref Range   Hemoglobin A1C 12     Assessment & Plan:   1. Uncontrolled type 2 diabetes mellitus with hyperglycemia (Stephanie [name])   - Stephanie [name] has currently uncontrolled symptomatic type 2 DM since 82 years of age,  with most recent A1c of 12 %. Recent labs reviewed.  -her diabetes is complicated by obesity/sedentary life and Stephanie [name] remains at a high risk for more acute and chronic complications which include CAD, CVA, CKD, retinopathy, and neuropathy. These are all discussed in detail with the patient.  - I have counseled her on diet management and weight loss, by adopting a carbohydrate restricted/protein rich diet.  - Suggestion is made for her to avoid simple carbohydrates  from her diet including Cakes, Sweet  Desserts, Ice Cream, Soda (diet and regular), Sweet Tea, Candies, Chips, Cookies, Store Bought Juices, Alcohol in Excess of  1-2 drinks a day, Artificial Sweeteners, and "Sugar-free" Products. This will help patient to have stable blood glucose profile and potentially avoid unintended weight gain.  -  I encouraged her to switch to  unprocessed or minimally processed complex starch and increased protein intake (animal or plant source), fruits, and vegetables.  - she is advised to stick to a routine mealtimes to eat 3 meals  a day and avoid unnecessary snacks ( to snack only to correct hypoglycemia).   - she will be scheduled with Jearld Fenton, RDN, CDE for individualized diabetes education.  - I have approached her with the following individualized plan to manage diabetes and patient agrees:   -Based on her presentation with chronic hyperglycemia, she may require intensive treatment with basal/bolus insulin.   -However, at this time she is too overwhelmed to give such complicated insulin program.   -She is approached to initiate strict monitoring of blood glucose 4 times a day- before meals and at bedtime and return in 1 week with her meter and logs for reevaluation.    -In the meantime, I advised her to increase her Levemir to 60 units nightly.   -Patient is encouraged to call clinic for blood glucose levels less than 70 or above 300 mg /dl. -She complained of GI intolerance from metformin.  I advised her to lower metformin to 500 mg p.o. twice daily after breakfast and supper.    - she will be considered for incretin therapy as appropriate next visit. - Patient specific target  A1c;  LDL, HDL, Triglycerides, and  Waist Circumference were discussed in detail.  2) BP/HTN: Her blood pressure is controlled to target. Continue current medications including lisinopril 40 mg p.o. daily.    3) Lipids/HPL: No recent lipid panel to review.  She is advised to continue atorvastatin 20 mg p.o.  nightly. 4)  Weight/Diet: CDE Consult will be initiated , exercise, and detailed carbohydrates information provided.  5) Chronic Care/Health Maintenance:  -she  is on ACEI/ARB and Statin medications and  is encouraged to continue to follow up with Ophthalmology, Dentist,  Podiatrist at least yearly or according to recommendations, and advised to  stay away from smoking. I have recommended yearly flu vaccine and pneumonia vaccination at least every 5 years; moderate intensity exercise for up to 150 minutes weekly; and  sleep for at least 7 hours a day.  - I advised patient to maintain close follow up with Abran Richard, MD for primary care needs.  - Time spent with the patient: 45 minutes, of which >50% was spent in obtaining information about her symptoms, reviewing her previous labs, evaluations, and treatments, counseling her about her currently uncontrolled type 2 diabetes, hyperlipidemia, hypertension, and developing developing  plans for long term treatment based on the latest recommendations.  [name] participated in the discussions, expressed understanding, and voiced agreement with the above plans.  All questions were answered to her satisfaction. she is encouraged to contact clinic should she have any questions or concerns prior to her return visit.  Follow up plan: - Return in about 1 week (around 07/13/2017) for follow up with meter and logs- no labs.  Glade Lloyd, MD [facility] [address] [place], [place] [address] Phone: [phone]  Fax: [phone]    07/06/2017, 12:16 PM  This note was partially dictated with voice recognition software. Similar sounding words can be transcribed inadequately or may not  be corrected upon review.

## 2017-07-20 ENCOUNTER — Encounter: Payer: Self-pay | Admitting: "Endocrinology

## 2017-07-20 ENCOUNTER — Ambulatory Visit (INDEPENDENT_AMBULATORY_CARE_PROVIDER_SITE_OTHER): Payer: Medicare HMO | Admitting: "Endocrinology

## 2017-07-20 VITALS — BP 138/72 | HR 62 | Ht 64.75 in | Wt 212.0 lb

## 2017-07-20 DIAGNOSIS — Z6836 Body mass index (BMI) 36.0-36.9, adult: Secondary | ICD-10-CM

## 2017-07-20 DIAGNOSIS — I10 Essential (primary) hypertension: Secondary | ICD-10-CM

## 2017-07-20 DIAGNOSIS — E782 Mixed hyperlipidemia: Secondary | ICD-10-CM

## 2017-07-20 DIAGNOSIS — E1165 Type 2 diabetes mellitus with hyperglycemia: Secondary | ICD-10-CM

## 2017-07-20 NOTE — Patient Instructions (Signed)

## 2017-07-20 NOTE — Progress Notes (Signed)
Endocrinology follow up Note       07/20/2017, 9:42 AM   Subjective:    Patient ID: Stephanie [name], female    DOB: [birth year].  Stephanie [name] is being seen in follow up for management of currently uncontrolled symptomatic diabetes requested by  Abran Richard, MD.   Past Medical History:  Diagnosis Date  . Arthritis   . Asthma   . Diabetes mellitus   . Diabetes mellitus without complication ([place])   . Hypercholesteremia   . Hyperlipemia   . Hypertension    Past Surgical History:  Procedure Laterality Date  . APPENDECTOMY    . CYST REMOVAL HAND     Right, hospital in [place] ARTHROSCOPY WITH MEDIAL MENISECTOMY Right 02/28/2012   Procedure: KNEE ARTHROSCOPY WITH MEDIAL MENISECTOMY;  Surgeon: Carole Civil, MD;  Location: [facility];  Service: Orthopedics;  Laterality: Right;  . REPAIR KNEE LIGAMENT     Social History   Socioeconomic History  . Marital status: Divorced    Spouse name: Not on file  . Number of children: Not on file  . Years of education: Not on file  . Highest education level: Not on file  Occupational History  . Not on file  Social Needs  . Financial resource strain: Not on file  . Food insecurity:    Worry: Not on file    Inability: Not on file  . Transportation needs:    Medical: Not on file    Non-medical: Not on file  Tobacco Use  . Smoking status: Never Smoker  . Smokeless tobacco: Never Used  Substance and Sexual Activity  . Alcohol use: No  . Drug use: No  . Sexual activity: Never  Lifestyle  . Physical activity:    Days per week: Not on file    Minutes per session: Not on file  . Stress: Not on file  Relationships  . Social connections:    Talks on phone: Not on file    Gets together: Not on file    Attends religious service: Not on file    Active member of club or organization: Not on file    Attends meetings of clubs or organizations: Not on  file    Relationship status: Not on file  Other Topics Concern  . Not on file  Social History Narrative   ** Merged History Encounter **       Outpatient Encounter Medications as of 07/20/2017  Medication Sig  . atorvastatin (LIPITOR) 20 MG tablet Take 20 mg by mouth daily.  Marland Kitchen LEVEMIR FLEXTOUCH 100 UNIT/ML Pen Inject 60 Units into the skin at bedtime.  Marland Kitchen lisinopril (PRINIVIL,ZESTRIL) 20 MG tablet Take 40 mg by mouth daily.  . metFORMIN (GLUCOPHAGE) 500 MG tablet Take 500 mg by mouth 2 (two) times daily with a meal.  . triamterene-hydrochlorothiazide (MAXZIDE-25) 37.5-25 MG per tablet Take 1 tablet by mouth daily.   No facility-administered encounter medications on file as of 07/20/2017.     ALLERGIES: Allergies  Allergen Reactions  . Amoxicillin Swelling  . Amoxicillin Swelling    Tongue swelling   . Penicillins Swelling  .  Sulfa Antibiotics Swelling  . Sulfa Antibiotics Swelling    Lip swelling    VACCINATION STATUS:  There is no immunization history on file for this patient.  Diabetes  She presents for her follow-up diabetic visit. She has type 2 diabetes mellitus. Onset time: She was diagnosed at approximate age of 89 years. Her disease course has been improving. There are no hypoglycemic associated symptoms. Pertinent negatives for hypoglycemia include no confusion, headaches, pallor or seizures. Pertinent negatives for diabetes include no chest pain, no fatigue, no polydipsia, no polyphagia and no polyuria. There are no hypoglycemic complications. Symptoms are improving. There are no diabetic complications. Risk factors for coronary artery disease include diabetes mellitus, dyslipidemia, hypertension, obesity, post-menopausal and sedentary lifestyle. Current diabetic treatment includes insulin injections (She is currently on Levemir 58 units nightly, metformin 1000 mg p.o. twice daily.). Her weight is decreasing steadily. She is following a generally unhealthy diet. When asked  about meal planning, she reported none. She has had a previous visit with a dietitian. She never participates in exercise. Her breakfast blood glucose range is generally 130-140 mg/dl. Her lunch blood glucose range is generally 130-140 mg/dl. Her dinner blood glucose range is generally 130-140 mg/dl. Her bedtime blood glucose range is generally 130-140 mg/dl. Her overall blood glucose range is 130-140 mg/dl. (  Her most recent A1c was 12% on Jun 06, 2017.  Her previous A1c measurements were 11% and 10.5%.) An ACE inhibitor/angiotensin II receptor blocker is being taken. Eye exam is current.  Hyperlipidemia  This is a chronic problem. The current episode started more than 1 year ago. Exacerbating diseases include diabetes and obesity. Pertinent negatives include no chest pain, myalgias or shortness of breath. Current antihyperlipidemic treatment includes statins. Risk factors for coronary artery disease include dyslipidemia, diabetes mellitus, hypertension, obesity, post-menopausal and a sedentary lifestyle.  Hypertension  This is a chronic problem. The current episode started more than 1 year ago. The problem is controlled. Pertinent negatives include no chest pain, headaches, palpitations or shortness of breath. Risk factors for coronary artery disease include diabetes mellitus, dyslipidemia, obesity, post-menopausal state and sedentary lifestyle. Past treatments include ACE inhibitors.      Review of Systems  Constitutional: Negative for chills, fatigue, fever and unexpected weight change.  HENT: Negative for trouble swallowing and voice change.   Eyes: Negative for visual disturbance.  Respiratory: Negative for cough, shortness of breath and wheezing.   Cardiovascular: Negative for chest pain, palpitations and leg swelling.  Gastrointestinal: Negative for diarrhea, nausea and vomiting.  Endocrine: Negative for cold intolerance, heat intolerance, polydipsia, polyphagia and polyuria.   Musculoskeletal: Negative for arthralgias and myalgias.  Skin: Negative for color change, pallor, rash and wound.  Neurological: Negative for seizures and headaches.  Psychiatric/Behavioral: Negative for confusion and suicidal ideas.    Objective:    BP 138/72   Pulse 62   Ht 5' 4.75" (1.645 m)   Wt 212 lb (96.2 kg)   BMI 35.55 kg/m   Wt Readings from Last 3 Encounters:  07/20/17 212 lb (96.2 kg)  07/06/17 216 lb (98 kg)  07/06/17 216 lb (98 kg)     Physical Exam  Constitutional: She is oriented to person, place, and time. She appears well-developed.  HENT:  Head: Normocephalic and atraumatic.  Eyes: EOM are normal.  Neck: Normal range of motion. Neck supple. No tracheal deviation present. No thyromegaly present.  Cardiovascular: Normal rate.  Pulmonary/Chest: Effort normal.  Abdominal: There is no tenderness. There is no guarding.  Musculoskeletal: Normal range of motion. She exhibits no edema.  Neurological: She is alert and oriented to person, place, and time. She has normal reflexes. No cranial nerve deficit. Coordination normal.  Skin: Skin is warm and dry. No rash noted. No erythema. No pallor.  Psychiatric: She has a normal mood and affect. Judgment normal.    CMP ( most recent) CMP     Component Value Date/Time   NA 133 (L) 04/25/2014 1037   K 4.5 04/25/2014 1037   CL 99 04/25/2014 1037   CO2 25 04/25/2014 1037   GLUCOSE 205 (H) 04/25/2014 1037   BUN 18 06/06/2017   CREATININE 1.0 06/06/2017   CREATININE 1.17 (H) 04/25/2014 1037   CALCIUM 9.7 04/25/2014 1037   PROT 7.6 04/25/2014 1037   ALBUMIN 3.6 04/25/2014 1037   AST 16 04/25/2014 1037   ALT 11 04/25/2014 1037   ALKPHOS 89 04/25/2014 1037   BILITOT 0.7 04/25/2014 1037   GFRNONAA 44 (L) 04/25/2014 1037   GFRAA 51 (L) 04/25/2014 1037    Recent Results (from the past 2160 hour(s))  Basic metabolic panel     Status: None   Collection Time: 06/06/17 12:00 AM  Result Value Ref Range   BUN 18 4 - 21    Creatinine 1.0 0.5 - 1.1  Hemoglobin A1c     Status: None   Collection Time: 06/06/17 12:00 AM  Result Value Ref Range   Hemoglobin A1C 12     Assessment & Plan:   1. Uncontrolled type 2 diabetes mellitus with hyperglycemia ([place])  - MAGDALENE TARDIFF has currently uncontrolled symptomatic type 2 DM since 82 years of age,  with most recent A1c of 12 %. Recent labs reviewed. -She returns with significant improvement in her glycemic profile to near target.  -her diabetes is complicated by obesity/sedentary life and PARISHA BEAULAC remains at a high risk for more acute and chronic complications which include CAD, CVA, CKD, retinopathy, and neuropathy. These are all discussed in detail with the patient.  - I have counseled her on diet management and weight loss, by adopting a carbohydrate restricted/protein rich diet.  -  Suggestion is made for her to avoid simple carbohydrates  from her diet including Cakes, Sweet Desserts / Pastries, Ice Cream, Soda (diet and regular), Sweet Tea, Candies, Chips, Cookies, Store Bought Juices, Alcohol in Excess of  1-2 drinks a day, Artificial Sweeteners, and "Sugar-free" Products. This will help patient to have stable blood glucose profile and potentially avoid unintended weight gain.   - I encouraged her to switch to  unprocessed or minimally processed complex starch and increased protein intake (animal or plant source), fruits, and vegetables.  - she is advised to stick to a routine mealtimes to eat 3 meals  a day and avoid unnecessary snacks ( to snack only to correct hypoglycemia).   - she has been  scheduled with Jearld Fenton, RDN, CDE for individualized diabetes education.  - I have approached her with the following individualized plan to manage diabetes and patient agrees:   -Based on her presentation with near target glycemic profile, she will not be given prandial insulin for now. -However, she will continue to require basal insulin.  I  advised her to continue Levemir 60 units nightly associated with monitoring of blood glucose 2 times daily-before breakfast and at bedtime.   -Patient is encouraged to call clinic for blood glucose levels less than 70 or above 300 mg /dl. -She tolerated metformin at a lower  dose.  I advised her to continue metformin 500 mg p.o. twice daily- after breakfast and after supper.    - she will be considered for incretin therapy as appropriate next visit. - Patient specific target  A1c;  LDL, HDL, Triglycerides, and  Waist Circumference were discussed in detail.  2) BP/HTN: Her blood pressure is controlled to target.  She is advised to continue current medications including lisinopril 40 mg p.o. daily.    3) Lipids/HPL: No recent lipid panel to review.  She is advised to continue atorvastatin 20 mg p.o. Nightly, he will have fasting lipid panel before her next visit. 4)  Weight/Diet: CDE Consult has been  initiated , exercise, and detailed carbohydrates information provided.  5) Chronic Care/Health Maintenance:  -she  is on ACEI/ARB and Statin medications and  is encouraged to continue to follow up with Ophthalmology, Dentist,  Podiatrist at least yearly or according to recommendations, and advised to  stay away from smoking. I have recommended yearly flu vaccine and pneumonia vaccination at least every 5 years; moderate intensity exercise for up to 150 minutes weekly; and  sleep for at least 7 hours a day.  - I advised patient to maintain close follow up with Abran Richard, MD for primary care needs.  - Time spent with the patient: 25 min, of which >50% was spent in reviewing her blood glucose logs , discussing her hypo- and hyper-glycemic episodes, reviewing her current and  previous labs and insulin doses and developing a plan to avoid hypo- and hyper-glycemia. Please refer to Patient Instructions for Blood Glucose Monitoring and Insulin/Medications Dosing Guide"  in media tab for additional  information. [name] participated in the discussions, expressed understanding, and voiced agreement with the above plans.  All questions were answered to her satisfaction. she is encouraged to contact clinic should she have any questions or concerns prior to her return visit.   Follow up plan: - Return in about 2 months (around 09/21/2017) for follow up with pre-visit labs, meter, and logs.  Glade Lloyd, MD [facility] [address] [place],  [address] Phone: [phone]  Fax: [phone]    07/20/2017, 9:42 AM  This note was partially dictated with voice recognition software. Similar sounding words can be transcribed inadequately or may not  be corrected upon review.

## 2017-09-22 ENCOUNTER — Ambulatory Visit: Payer: Medicare HMO | Admitting: "Endocrinology

## 2017-11-15 ENCOUNTER — Other Ambulatory Visit: Payer: Self-pay

## 2017-11-15 ENCOUNTER — Encounter (HOSPITAL_COMMUNITY): Payer: Self-pay

## 2017-11-15 ENCOUNTER — Emergency Department (HOSPITAL_COMMUNITY): Payer: Medicare HMO

## 2017-11-15 ENCOUNTER — Inpatient Hospital Stay (HOSPITAL_COMMUNITY)
Admission: EM | Admit: 2017-11-15 | Discharge: 2017-11-17 | DRG: 872 | Disposition: A | Discharge status: Home / Self Care | Payer: Medicare HMO | Attending: Internal Medicine | Admitting: Internal Medicine

## 2017-11-15 DIAGNOSIS — Z881 Allergy status to other antibiotic agents status: Secondary | ICD-10-CM | POA: Diagnosis not present

## 2017-11-15 DIAGNOSIS — N179 Acute kidney failure, unspecified: Secondary | ICD-10-CM | POA: Diagnosis present

## 2017-11-15 DIAGNOSIS — Z9049 Acquired absence of other specified parts of digestive tract: Secondary | ICD-10-CM

## 2017-11-15 DIAGNOSIS — M1990 Unspecified osteoarthritis, unspecified site: Secondary | ICD-10-CM | POA: Diagnosis present

## 2017-11-15 DIAGNOSIS — R079 Chest pain, unspecified: Secondary | ICD-10-CM

## 2017-11-15 DIAGNOSIS — N390 Urinary tract infection, site not specified: Secondary | ICD-10-CM

## 2017-11-15 DIAGNOSIS — I129 Hypertensive chronic kidney disease with stage 1 through stage 4 chronic kidney disease, or unspecified chronic kidney disease: Secondary | ICD-10-CM | POA: Diagnosis present

## 2017-11-15 DIAGNOSIS — E66812 Obesity, class 2: Secondary | ICD-10-CM

## 2017-11-15 DIAGNOSIS — Z882 Allergy status to sulfonamides status: Secondary | ICD-10-CM

## 2017-11-15 DIAGNOSIS — Z8249 Family history of ischemic heart disease and other diseases of the circulatory system: Secondary | ICD-10-CM

## 2017-11-15 DIAGNOSIS — E1165 Type 2 diabetes mellitus with hyperglycemia: Secondary | ICD-10-CM | POA: Diagnosis present

## 2017-11-15 DIAGNOSIS — E782 Mixed hyperlipidemia: Secondary | ICD-10-CM | POA: Diagnosis present

## 2017-11-15 DIAGNOSIS — E1169 Type 2 diabetes mellitus with other specified complication: Secondary | ICD-10-CM | POA: Diagnosis present

## 2017-11-15 DIAGNOSIS — Z7984 Long term (current) use of oral hypoglycemic drugs: Secondary | ICD-10-CM

## 2017-11-15 DIAGNOSIS — A419 Sepsis, unspecified organism: Secondary | ICD-10-CM | POA: Diagnosis present

## 2017-11-15 DIAGNOSIS — E11649 Type 2 diabetes mellitus with hypoglycemia without coma: Secondary | ICD-10-CM | POA: Diagnosis present

## 2017-11-15 DIAGNOSIS — E1122 Type 2 diabetes mellitus with diabetic chronic kidney disease: Secondary | ICD-10-CM | POA: Diagnosis present

## 2017-11-15 DIAGNOSIS — Z833 Family history of diabetes mellitus: Secondary | ICD-10-CM | POA: Diagnosis not present

## 2017-11-15 DIAGNOSIS — J45909 Unspecified asthma, uncomplicated: Secondary | ICD-10-CM | POA: Diagnosis present

## 2017-11-15 DIAGNOSIS — R6520 Severe sepsis without septic shock: Secondary | ICD-10-CM | POA: Diagnosis present

## 2017-11-15 DIAGNOSIS — Z6836 Body mass index (BMI) 36.0-36.9, adult: Secondary | ICD-10-CM | POA: Diagnosis not present

## 2017-11-15 DIAGNOSIS — N183 Chronic kidney disease, stage 3 (moderate): Secondary | ICD-10-CM | POA: Diagnosis present

## 2017-11-15 DIAGNOSIS — R6510 Systemic inflammatory response syndrome (SIRS) of non-infectious origin without acute organ dysfunction: Secondary | ICD-10-CM

## 2017-11-15 DIAGNOSIS — I10 Essential (primary) hypertension: Secondary | ICD-10-CM | POA: Diagnosis present

## 2017-11-15 DIAGNOSIS — Z880 Allergy status to penicillin: Secondary | ICD-10-CM

## 2017-11-15 DIAGNOSIS — R531 Weakness: Secondary | ICD-10-CM

## 2017-11-15 DIAGNOSIS — R112 Nausea with vomiting, unspecified: Secondary | ICD-10-CM

## 2017-11-15 DIAGNOSIS — D72829 Elevated white blood cell count, unspecified: Secondary | ICD-10-CM

## 2017-11-15 LAB — COMPREHENSIVE METABOLIC PANEL
ALT: 12 U/L (ref 0–44)
AST: 14 U/L — ABNORMAL LOW (ref 15–41)
Albumin: 3.9 g/dL (ref 3.5–5.0)
Alkaline Phosphatase: 140 U/L — ABNORMAL HIGH (ref 38–126)
Anion gap: 13 (ref 5–15)
BUN: 41 mg/dL — ABNORMAL HIGH (ref 8–23)
CO2: 21 mmol/L — ABNORMAL LOW (ref 22–32)
Calcium: 10 mg/dL (ref 8.9–10.3)
Chloride: 99 mmol/L (ref 98–111)
Creatinine, Ser: 1.54 mg/dL — ABNORMAL HIGH (ref 0.44–1.00)
GFR calc Af Amer: 36 mL/min — ABNORMAL LOW (ref >60)
GFR calc non Af Amer: 31 mL/min — ABNORMAL LOW (ref >60)
Glucose, Bld: 256 mg/dL — ABNORMAL HIGH (ref 70–99)
Potassium: 4.6 mmol/L (ref 3.5–5.1)
Sodium: 133 mmol/L — ABNORMAL LOW (ref 135–145)
Total Bilirubin: 1.3 mg/dL — ABNORMAL HIGH (ref 0.3–1.2)
Total Protein: 7.5 g/dL (ref 6.5–8.1)

## 2017-11-15 LAB — CBG MONITORING, ED
Glucose-Capillary: 185 mg/dL — ABNORMAL HIGH (ref 70–99)
Glucose-Capillary: 270 mg/dL — ABNORMAL HIGH (ref 70–99)

## 2017-11-15 LAB — URINALYSIS, ROUTINE W REFLEX MICROSCOPIC
Bacteria, UA: NONE SEEN
Bilirubin Urine: NEGATIVE
Glucose, UA: 50 mg/dL — AB
Hgb urine dipstick: NEGATIVE
Ketones, ur: NEGATIVE mg/dL
Nitrite: NEGATIVE
Protein, ur: 30 mg/dL — AB
Specific Gravity, Urine: 1.025 (ref 1.005–1.030)
pH: 5 (ref 5.0–8.0)

## 2017-11-15 LAB — CBC WITH DIFFERENTIAL/PLATELET
Abs Immature Granulocytes: 0.14 10*3/uL — ABNORMAL HIGH (ref 0.00–0.07)
Basophils Absolute: 0.1 10*3/uL (ref 0.0–0.1)
Basophils Relative: 0 %
Eosinophils Absolute: 0.1 10*3/uL (ref 0.0–0.5)
Eosinophils Relative: 1 %
HCT: 42.4 % (ref 36.0–46.0)
Hemoglobin: 12.2 g/dL (ref 12.0–15.0)
Immature Granulocytes: 1 %
Lymphocytes Relative: 9 %
Lymphs Abs: 1.9 10*3/uL (ref 0.7–4.0)
MCH: 22.5 pg — ABNORMAL LOW (ref 26.0–34.0)
MCHC: 28.8 g/dL — ABNORMAL LOW (ref 30.0–36.0)
MCV: 78.2 fL — ABNORMAL LOW (ref 80.0–100.0)
Monocytes Absolute: 1.6 10*3/uL — ABNORMAL HIGH (ref 0.1–1.0)
Monocytes Relative: 7 %
Neutro Abs: 17.2 10*3/uL — ABNORMAL HIGH (ref 1.7–7.7)
Neutrophils Relative %: 82 %
Platelets: 408 10*3/uL — ABNORMAL HIGH (ref 150–400)
RBC: 5.42 MIL/uL — ABNORMAL HIGH (ref 3.87–5.11)
RDW: 16.5 % — ABNORMAL HIGH (ref 11.5–15.5)
WBC: 21 10*3/uL — ABNORMAL HIGH (ref 4.0–10.5)
nRBC: 0.2 % (ref 0.0–0.2)

## 2017-11-15 LAB — INFLUENZA PANEL BY PCR (TYPE A & B)
Influenza A By PCR: NEGATIVE
Influenza B By PCR: NEGATIVE

## 2017-11-15 LAB — LACTIC ACID, PLASMA
Lactic Acid, Venous: 3 mmol/L (ref 0.5–1.9)
Lactic Acid, Venous: 2.8 mmol/L (ref 0.5–1.9)

## 2017-11-15 LAB — PROTIME-INR
INR: 1
Prothrombin Time: 13.1 seconds (ref 11.4–15.2)

## 2017-11-15 LAB — TROPONIN I
Troponin I: 0.03 ng/mL (ref <0.03)
Troponin I: <0.03 ng/mL (ref <0.03)
Troponin I: <0.03 ng/mL (ref <0.03)

## 2017-11-15 LAB — LIPASE, BLOOD: Lipase: 55 U/L — ABNORMAL HIGH (ref 11–51)

## 2017-11-15 MED ORDER — SODIUM CHLORIDE 0.9 % IV SOLN
1.0000 g | Freq: Two times a day (BID) | INTRAVENOUS | Status: DC
  Administered 2017-11-15 – 2017-11-17 (×4): 1 g via INTRAVENOUS
  Filled 2017-11-15 (×6): qty 1

## 2017-11-15 MED ORDER — ONDANSETRON HCL 4 MG/2ML IJ SOLN: 4.0000 mg | Freq: Four times a day (QID) | INTRAMUSCULAR | Status: DC | PRN

## 2017-11-15 MED ORDER — SODIUM CHLORIDE 0.9 % IV SOLN: 250.0000 mL | INTRAVENOUS | Status: DC | PRN

## 2017-11-15 MED ORDER — INSULIN DETEMIR 100 UNIT/ML ~~LOC~~ SOLN
30.0000 [IU] | Freq: Every day | SUBCUTANEOUS | Status: DC
  Administered 2017-11-15: 30 [IU] via SUBCUTANEOUS
  Filled 2017-11-15: qty 0.3

## 2017-11-15 MED ORDER — SODIUM CHLORIDE 0.9 % IV BOLUS
2102.0000 mL | Freq: Once | INTRAVENOUS | Status: AC
  Administered 2017-11-15: 2102 mL via INTRAVENOUS

## 2017-11-15 MED ORDER — HEPARIN SODIUM (PORCINE) 5000 UNIT/ML IJ SOLN
5000.0000 [IU] | Freq: Three times a day (TID) | INTRAMUSCULAR | Status: DC
  Administered 2017-11-15 – 2017-11-17 (×6): 5000 [IU] via SUBCUTANEOUS
  Filled 2017-11-15 (×6): qty 1

## 2017-11-15 MED ORDER — CIPROFLOXACIN IN D5W 400 MG/200ML IV SOLN
400.0000 mg | Freq: Once | INTRAVENOUS | Status: DC
  Filled 2017-11-15: qty 200

## 2017-11-15 MED ORDER — ONDANSETRON HCL 4 MG/2ML IJ SOLN
INTRAMUSCULAR | Status: AC
  Administered 2017-11-15: 4 mg
  Filled 2017-11-15: qty 2

## 2017-11-15 MED ORDER — ATORVASTATIN CALCIUM 20 MG PO TABS
20.0000 mg | ORAL_TABLET | Freq: Every evening | ORAL | Status: DC
  Administered 2017-11-15 – 2017-11-16 (×2): 20 mg via ORAL
  Filled 2017-11-15: qty 1
  Filled 2017-11-15: qty 2

## 2017-11-15 MED ORDER — ACETAMINOPHEN 325 MG PO TABS
650.0000 mg | ORAL_TABLET | Freq: Four times a day (QID) | ORAL | Status: DC | PRN
  Administered 2017-11-16 (×2): 650 mg via ORAL
  Filled 2017-11-15 (×2): qty 2

## 2017-11-15 MED ORDER — ONDANSETRON HCL 4 MG PO TABS: 4.0000 mg | ORAL_TABLET | Freq: Four times a day (QID) | ORAL | Status: DC | PRN

## 2017-11-15 MED ORDER — ALBUTEROL SULFATE (2.5 MG/3ML) 0.083% IN NEBU: 2.5000 mg | INHALATION_SOLUTION | Freq: Four times a day (QID) | RESPIRATORY_TRACT | Status: DC | PRN

## 2017-11-15 MED ORDER — ACETAMINOPHEN 650 MG RE SUPP: 650.0000 mg | Freq: Four times a day (QID) | RECTAL | Status: DC | PRN

## 2017-11-15 MED ORDER — INSULIN ASPART 100 UNIT/ML ~~LOC~~ SOLN
0.0000 [IU] | SUBCUTANEOUS | Status: DC
  Administered 2017-11-15: 4 [IU] via SUBCUTANEOUS
  Administered 2017-11-15: 11 [IU] via SUBCUTANEOUS
  Filled 2017-11-15 (×2): qty 1

## 2017-11-15 MED ORDER — SODIUM CHLORIDE 0.9 % IV SOLN
INTRAVENOUS | Status: DC
  Administered 2017-11-15 – 2017-11-16 (×2): via INTRAVENOUS

## 2017-11-15 MED ORDER — SODIUM CHLORIDE 0.9% FLUSH
3.0000 mL | Freq: Two times a day (BID) | INTRAVENOUS | Status: DC
  Administered 2017-11-15 – 2017-11-17 (×4): 3 mL via INTRAVENOUS

## 2017-11-15 MED ORDER — VANCOMYCIN HCL 10 G IV SOLR: 1250.0000 mg | INTRAVENOUS | Status: DC

## 2017-11-15 MED ORDER — SODIUM CHLORIDE 0.9% FLUSH: 3.0000 mL | INTRAVENOUS | Status: DC | PRN

## 2017-11-15 MED ORDER — VANCOMYCIN HCL 10 G IV SOLR
2000.0000 mg | Freq: Once | INTRAVENOUS | Status: AC
  Administered 2017-11-15: 2000 mg via INTRAVENOUS
  Filled 2017-11-15: qty 2000

## 2017-11-15 MED ORDER — SODIUM CHLORIDE 0.9 % IV BOLUS
1000.0000 mL | Freq: Once | INTRAVENOUS | Status: AC
  Administered 2017-11-15: 1000 mL via INTRAVENOUS

## 2017-11-15 NOTE — ED Provider Notes (Signed)
[facility] EMERGENCY DEPARTMENT Provider Note   CSN: [id number] Arrival date & time: 11/15/17  1211     History   Chief Complaint Chief Complaint  Patient presents with  . Weakness  . Chest Pain    HPI Stephanie [name] is a 82 y.o. female.  HPI  Pt was seen at 1250. Per EMS and pt report, c/o gradual onset and persistence of constant mid-sternal chest "pain" for the past 2 days. Pt describes the CP as constant and "aching." Has been associated with generalized weakness/fatigue and N/V today. Pt states she was evaluated by her PMD PTA, received ASA and SL ntg which improved her CP. Denies fevers, no sore throat, no SOB/cough, no back pain, no abd pain, no diarrhea, no focal motor weakness, no tingling/numbness in extremities.   Past Medical History:  Diagnosis Date  . Arthritis   . Asthma   . Diabetes mellitus   . Diabetes mellitus without complication ([place])   . Hypercholesteremia   . Hyperlipemia   . Hypertension     Patient Active Problem List   Diagnosis Date Noted  . Uncontrolled type 2 diabetes mellitus with hyperglycemia ([place]) 07/06/2017  . Essential hypertension, benign 07/06/2017  . Mixed hyperlipidemia 07/06/2017  . Class 2 severe obesity due to excess calories with serious comorbidity and body mass index (BMI) of 36.0 to 36.9 in adult ([place]) 07/06/2017  . Medial meniscus, posterior horn derangement 04/12/2012  . OA (osteoarthritis) of knee 04/12/2012    Past Surgical History:  Procedure Laterality Date  . APPENDECTOMY    . CYST REMOVAL HAND     Right, hospital in [place] ARTHROSCOPY WITH MEDIAL MENISECTOMY Right 02/28/2012   Procedure: KNEE ARTHROSCOPY WITH MEDIAL MENISECTOMY;  Surgeon: Carole Civil, MD;  Location: [facility];  Service: Orthopedics;  Laterality: Right;  . REPAIR KNEE LIGAMENT       OB History   None      Home Medications    Prior to Admission medications   Medication Sig Start Date End Date Taking? Authorizing Provider    atorvastatin (LIPITOR) 20 MG tablet Take 20 mg by mouth daily.    [provider]  LEVEMIR FLEXTOUCH 100 UNIT/ML Pen Inject 60 Units into the skin at bedtime. 09/15/13   [provider]  lisinopril (PRINIVIL,ZESTRIL) 20 MG tablet Take 40 mg by mouth daily.    [provider]  metFORMIN (GLUCOPHAGE) 500 MG tablet Take 500 mg by mouth 2 (two) times daily with a meal.    [provider]  triamterene-hydrochlorothiazide (MAXZIDE-25) 37.5-25 MG per tablet Take 1 tablet by mouth daily.    [provider]    Family History Family History  Problem Relation Age of Onset  . Lung disease Unknown   . Cancer Unknown   . Arthritis Unknown   . Asthma Unknown   . Diabetes Unknown   . Diabetes Mother   . Hypertension Mother   . Diabetes Father   . Hypertension Sister   . Diabetes Brother     Social History Social History   Tobacco Use  . Smoking status: Never Smoker  . Smokeless tobacco: Never Used  Substance Use Topics  . Alcohol use: No  . Drug use: No     Allergies   Amoxicillin; Amoxicillin; Penicillins; Sulfa antibiotics; and Sulfa antibiotics   Review of Systems Review of Systems ROS: Statement: All systems negative except as marked or noted in the HPI; Constitutional: Negative for fever and chills. ; ;  Eyes: Negative for eye pain, redness and discharge. ; ; ENMT: Negative for ear pain, hoarseness, nasal congestion, sinus pressure and sore throat. ; ; Cardiovascular: +CP. Negative for palpitations, diaphoresis, dyspnea and peripheral edema. ; ; Respiratory: Negative for cough, wheezing and stridor. ; ; Gastrointestinal: +N/V. Negative for diarrhea, abdominal pain, blood in stool, hematemesis, jaundice and rectal bleeding. . ; ; Genitourinary: Negative for dysuria, flank pain and hematuria. ; ; Musculoskeletal: Negative for back pain and neck pain. Negative for swelling and trauma.; ; Skin: Negative for pruritus, rash, abrasions, blisters,  bruising and skin lesion.; ; Neuro: +generalized weakness/fatigue. Negative for headache, lightheadedness and neck stiffness. Negative for altered level of consciousness, altered mental status, extremity weakness, paresthesias, involuntary movement, seizure and syncope.       Physical Exam Updated Vital Signs BP (!) 141/117   Pulse (!) 113   Temp 99.4 F (37.4 C) (Oral)   Resp (!) 23   Wt 103.4 kg   SpO2 95%   BMI 38.23 kg/m    Patient Vitals for the past 24 hrs:  BP Temp Temp src Pulse Resp SpO2 Weight  11/15/17 1445 91/66 - - 100 16 98 % -  11/15/17 1442 - 99.8 F (37.7 C) Rectal - - - -  11/15/17 1230 (!) 97/56 - - 94 (!) 30 94 % -  11/15/17 1224 (!) 141/117 99.4 F (37.4 C) Oral - - - -  11/15/17 1222 - - - (!) 113 (!) 23 95 % -  11/15/17 1220 - - - - - - 103.4 kg   13:17 Orthostatic Vital Signs TH  Orthostatic Lying   BP- Lying: 102/60   Pulse- Lying: 97       Orthostatic Sitting  BP- Sitting: 114/58   Pulse- Sitting: 89       Orthostatic Standing at 0 minutes  BP- Standing at 0 minutes: 117/72   Pulse- Standing at 0 minutes: 105      Physical Exam 1255: Physical examination:  Nursing notes reviewed; Vital signs and O2 SAT reviewed;  Constitutional: Well developed, Well nourished, In no acute distress; Head:  Normocephalic, atraumatic; Eyes: EOMI, PERRL, No scleral icterus; ENMT: Mouth and pharynx normal, Mucous membranes dry; Neck: Supple, Full range of motion, No lymphadenopathy; Cardiovascular: Irregular rate and rhythm, No gallop; Respiratory: Breath sounds clear & equal bilaterally, No wheezes.  Speaking full sentences with ease, Normal respiratory effort/excursion; Chest: Nontender, Movement normal; Abdomen: Soft, Nontender, Nondistended, Normal bowel sounds; Genitourinary: No CVA tenderness; Extremities: Peripheral pulses normal, No tenderness, No edema, No calf edema or asymmetry.; Neuro: AA&Ox3, Major CN grossly intact. No facial droop. Speech clear. Grips  equal. Strength 5/5 equal bilat UE's and LE's. Pt moves all extremities on stretcher spontaneously and to command without apparent gross focal motor deficits.; Skin: Color normal, Warm, Dry.   ED Treatments / Results  Labs (all labs ordered are listed, but only abnormal results are displayed)   EKG EKG Interpretation  Date/Time:  Tuesday November 15 2017 12:22:00 EDT Ventricular Rate:  89 PR Interval:    QRS Duration: 73 QT Interval:  333 QTC Calculation: 406 R Axis:   -25 Text Interpretation:  Sinus arrhythmia Multiple premature complexes, vent & supraven LVH with secondary repolarization abnormality Anterior Q waves, possibly due to LVH Baseline wander When compared with ECG of 11/29/2013 No significant change was found Confirmed by Francine Graven [phone]) on 11/15/2017 12:48:37 PM   Radiology   Procedures Procedures (including critical care time)  Medications Ordered in ED  Medications - No data to display   Initial Impression / Assessment and Plan / ED Course  I have reviewed the triage vital signs and the nursing notes.  Pertinent labs & imaging results that were available during my care of the patient were reviewed by me and considered in my medical decision making (see chart for details).  MDM Reviewed: previous chart, nursing note and vitals Reviewed previous: labs and ECG Interpretation: labs, ECG and x-ray Total time providing critical care: 30-74 minutes. This excludes time spent performing separately reportable procedures and services. Consults: admitting MD   CRITICAL CARE Performed by: Francine Graven Total critical care time: 35 minutes Critical care time was exclusive of separately billable procedures and treating other patients. Critical care was necessary to treat or prevent imminent or life-threatening deterioration. Critical care was time spent personally by me on the following activities: development of treatment plan with patient and/or  surrogate as well as nursing, discussions with consultants, evaluation of patient's response to treatment, examination of patient, obtaining history from patient or surrogate, ordering and performing treatments and interventions, ordering and review of laboratory studies, ordering and review of radiographic studies, pulse oximetry and re-evaluation of patient's condition.   Results for orders placed or performed during the hospital encounter of 11/15/17  CBC with Differential  Result Value Ref Range   WBC 21.0 (H) 4.0 - 10.5 K/uL   RBC 5.42 (H) 3.87 - 5.11 MIL/uL   Hemoglobin 12.2 12.0 - 15.0 g/dL   HCT 42.4 36.0 - 46.0 %   MCV 78.2 (L) 80.0 - 100.0 fL   MCH 22.5 (L) 26.0 - 34.0 pg   MCHC 28.8 (L) 30.0 - 36.0 g/dL   RDW 16.5 (H) 11.5 - 15.5 %   Platelets 408 (H) 150 - 400 K/uL   nRBC 0.2 0.0 - 0.2 %   Neutrophils Relative % 82 %   Neutro Abs 17.2 (H) 1.7 - 7.7 K/uL   Lymphocytes Relative 9 %   Lymphs Abs 1.9 0.7 - 4.0 K/uL   Monocytes Relative 7 %   Monocytes Absolute 1.6 (H) 0.1 - 1.0 K/uL   Eosinophils Relative 1 %   Eosinophils Absolute 0.1 0.0 - 0.5 K/uL   Basophils Relative 0 %   Basophils Absolute 0.1 0.0 - 0.1 K/uL   Immature Granulocytes 1 %   Abs Immature Granulocytes 0.14 (H) 0.00 - 0.07 K/uL  Protime-INR  Result Value Ref Range   Prothrombin Time 13.1 11.4 - 15.2 seconds   INR 1.00   Lactic acid, plasma  Result Value Ref Range   Lactic Acid, Venous 3.0 (HH) 0.5 - 1.9 mmol/L  Lipase, blood  Result Value Ref Range   Lipase 55 (H) 11 - 51 U/L  Comprehensive metabolic panel  Result Value Ref Range   Sodium 133 (L) 135 - 145 mmol/L   Potassium 4.6 3.5 - 5.1 mmol/L   Chloride 99 98 - 111 mmol/L   CO2 21 (L) 22 - 32 mmol/L   Glucose, Bld 256 (H) 70 - 99 mg/dL   BUN 41 (H) 8 - 23 mg/dL   Creatinine, Ser 1.54 (H) 0.44 - 1.00 mg/dL   Calcium 10.0 8.9 - 10.3 mg/dL   Total Protein 7.5 6.5 - 8.1 g/dL   Albumin 3.9 3.5 - 5.0 g/dL   AST 14 (L) 15 - 41 U/L   ALT 12 0 -  44 U/L   Alkaline Phosphatase 140 (H) 38 - 126 U/L   Total Bilirubin 1.3 (H) 0.3 -  1.2 mg/dL   GFR calc non Af Amer 31 (L) >60 mL/min   GFR calc Af Amer 36 (L) >60 mL/min   Anion gap 13 5 - 15  Urinalysis, Routine w reflex microscopic  Result Value Ref Range   Color, Urine YELLOW YELLOW   APPearance HAZY (A) CLEAR   Specific Gravity, Urine 1.025 1.005 - 1.030   pH 5.0 5.0 - 8.0   Glucose, UA 50 (A) NEGATIVE mg/dL   Hgb urine dipstick NEGATIVE NEGATIVE   Bilirubin Urine NEGATIVE NEGATIVE   Ketones, ur NEGATIVE NEGATIVE mg/dL   Protein, ur 30 (A) NEGATIVE mg/dL   Nitrite NEGATIVE NEGATIVE   Leukocytes, UA SMALL (A) NEGATIVE   RBC / HPF 0-5 0 - 5 RBC/hpf   WBC, UA 21-50 0 - 5 WBC/hpf   Bacteria, UA NONE SEEN NONE SEEN   Squamous Epithelial / LPF 0-5 0 - 5   Hyaline Casts, UA PRESENT   Troponin I  Result Value Ref Range   Troponin I 0.03 (HH) <0.03 ng/mL   Dg Chest 2 View Result Date: 11/15/2017 CLINICAL DATA:  Mid chest pain, shortness of breath, dizziness, and fatigue. History of asthma, diabetes, nonsmoker. EXAM: CHEST - 2 VIEW COMPARISON:  Chest x-ray of April 25, 2014 FINDINGS: The lungs are adequately inflated and clear. The cardiac silhouette is enlarged. The pulmonary vascularity is not engorged. There is calcification in the wall of the aortic arch. There is no pleural effusion. There is mild multilevel degenerative disc disease of the thoracic spine. IMPRESSION: Mild cardiomegaly without pulmonary vascular congestion or pulmonary edema. No acute pneumonia. Electronically Signed   By: David  Martinique M.D.   On: 11/15/2017 13:06    Results for Stephanie [name], Stephanie [name] (MRN [id number]) as of 11/15/2017 14:57  Ref. Range 11/04/2013 14:35 11/29/2013 20:39 04/25/2014 10:37 06/06/2017 00:00 11/15/2017 13:23  BUN Latest Ref Range: 8 - 23 mg/dL 25 (H) 19 18 18  41 (H)  Creatinine Latest Ref Range: 0.44 - 1.00 mg/dL 1.52 (H) 1.05 1.17 (H) 1.0 1.54 (H)    1455:  Pt remains afebrile while in the  ED. VS initially stable and pt was without orthostasis, so initial IVF judicious. Lactic acid elevated, as well as BUN/Cr and WBC count. IVF NS 30mg /kg total ordered. BC and UC obtained. IV cipro started for presumed UTI. Flu swab pending. Pt remains A&O, resps easy, abd benign, neuro non-focal and denies any specific complaint currently. Denies recurrent CP.   T/C returned from [facility] Manuella Ghazi, case discussed, including:  HPI, pertinent PM/SHx, VS/PE, dx testing, ED course and treatment:  Agreeable to admit.       Final Clinical Impressions(s) / ED Diagnoses   Final diagnoses:  None    ED Discharge Orders    None       Francine Graven, DO 11/16/17 2202

## 2017-11-15 NOTE — ED Notes (Signed)
CRITICAL VALUE ALERT  Critical Value: troponin 0.03 lactic acid 3.0  Date & Time Notied:  11/15/17 1415  Provider Notified: mcmanus  Orders Received/Actions taken:

## 2017-11-15 NOTE — H&P (Signed)
History and Physical    ANGELITA HARNACK WUJ:[id number] DOB: 11-17-1935 DOA: 11/15/2017  PCP: Abran Richard, MD   Patient coming from: [facility]  Chief Complaint: Constant CP with weakness and dizziness  HPI: Stephanie [name] is a 82 y.o. female with medical history significant for DM2 with poor control, hypertension, HLD, and obesity who was brought to the emergency department with complaints of some intermittent chest pain that was bothersome since Sunday this week.  She denies any particular alleviating or exacerbating factors, but did receive some nitroglycerin with resolution of her pain today.  She was noted to have some generalized weakness and fatigue as well as nausea and vomiting.  She states that her chest pain was substernal and was aching in nature with no radiation.  She also describes burning with urination and some increased frequency and urgency, but no hematuria or discharge.  Family members describe that she has been having some trouble with her balance recently.   ED Course: Vital signs stable aside from some soft blood pressure readings with current systolics in the 95A.  Laboratory data with WBC count of 21,000, sodium 133, creatinine 1.54, BUN 41, lactic acid of 3, glucose of 256, and troponin of 0.03 with platelet count of 408.  Her usual baseline creatinine appears to be between 1 and 1.1 which is consistent with stage III CKD.  Chest x-ray two-view with mild cardiomegaly but no signs of pneumonia noted.  Urinalysis with some leukocytes noted.  She has been started on 30 cc/kg IV fluid bolus and blood cultures and urine cultures have been obtained.  Flu swab is pending.  Review of Systems: All others reviewed and otherwise negative.  Past Medical History:  Diagnosis Date  . Arthritis   . Asthma   . Diabetes mellitus   . Diabetes mellitus without complication ([place])   . Hypercholesteremia   . Hyperlipemia   . Hypertension     Past Surgical History:    Procedure Laterality Date  . APPENDECTOMY    . CYST REMOVAL HAND     Right, hospital in [place] ARTHROSCOPY WITH MEDIAL MENISECTOMY Right 02/28/2012   Procedure: KNEE ARTHROSCOPY WITH MEDIAL MENISECTOMY;  Surgeon: Carole Civil, MD;  Location: [facility];  Service: Orthopedics;  Laterality: Right;  . REPAIR KNEE LIGAMENT       reports that she has never smoked. She has never used smokeless tobacco. She reports that she does not drink alcohol or use drugs.  Allergies  Allergen Reactions  . Amoxicillin Swelling    Tongue swelling   . Penicillins Swelling    Has patient had a PCN reaction causing immediate rash, facial/tongue/throat swelling, SOB or lightheadedness with hypotension: No Has patient had a PCN reaction causing severe rash involving mucus membranes or skin necrosis: No Has patient had a PCN reaction that required hospitalization: No Has patient had a PCN reaction occurring within the last 10 years: No If all of the above answers are "NO", then may proceed with Cephalosporin use.   . Sulfa Antibiotics Swelling    Lip swelling    Family History  Problem Relation Age of Onset  . Lung disease Unknown   . Cancer Unknown   . Arthritis Unknown   . Asthma Unknown   . Diabetes Unknown   . Diabetes Mother   . Hypertension Mother   . Diabetes Father   . Hypertension Sister   . Diabetes Brother     Prior to Admission medications  Medication Sig Start Date End Date Taking? Authorizing Provider  atorvastatin (LIPITOR) 20 MG tablet Take 20 mg by mouth daily.    [provider]  LEVEMIR FLEXTOUCH 100 UNIT/ML Pen Inject 60 Units into the skin at bedtime. 09/15/13   [provider]  lisinopril (PRINIVIL,ZESTRIL) 20 MG tablet Take 40 mg by mouth daily.    [provider]  metFORMIN (GLUCOPHAGE) 500 MG tablet Take 500 mg by mouth 2 (two) times daily with a meal.    [provider]  triamterene-hydrochlorothiazide (MAXZIDE-25) 37.5-25 MG  per tablet Take 1 tablet by mouth daily.    [provider]    Physical Exam: Vitals:   11/15/17 1224 11/15/17 1230 11/15/17 1442 11/15/17 1445  BP: (!) 141/117 (!) 97/56  91/66  Pulse:  94  100  Resp:  (!) 30  16  Temp: 99.4 F (37.4 C)  99.8 F (37.7 C)   TempSrc: Oral  Rectal   SpO2:  94%  98%  Weight:        Constitutional: NAD, calm, comfortable, obese Vitals:   11/15/17 1224 11/15/17 1230 11/15/17 1442 11/15/17 1445  BP: (!) 141/117 (!) 97/56  91/66  Pulse:  94  100  Resp:  (!) 30  16  Temp: 99.4 F (37.4 C)  99.8 F (37.7 C)   TempSrc: Oral  Rectal   SpO2:  94%  98%  Weight:       Eyes: lids and conjunctivae normal ENMT: Mucous membranes are moist.  Neck: normal, supple Respiratory: clear to auscultation bilaterally. Normal respiratory effort. No accessory muscle use.  Cardiovascular: Regular rate and rhythm, no murmurs. No extremity edema. Abdomen: no tenderness, no distention. Bowel sounds positive.  Musculoskeletal:  No joint deformity upper and lower extremities.   Skin: no rashes, lesions, ulcers.  Psychiatric: Normal judgment and insight. Alert and oriented x 3. Normal mood.   Labs on Admission: I have personally reviewed following labs and imaging studies  CBC: Recent Labs  Lab 11/15/17 1323  WBC 21.0*  NEUTROABS 17.2*  HGB 12.2  HCT 42.4  MCV 78.2*  PLT 829*   Basic Metabolic Panel: Recent Labs  Lab 11/15/17 1323  NA 133*  K 4.6  CL 99  CO2 21*  GLUCOSE 256*  BUN 41*  CREATININE 1.54*  CALCIUM 10.0   GFR: Estimated Creatinine Clearance: 35.2 mL/min (A) (by C-G formula based on SCr of 1.54 mg/dL (H)). Liver Function Tests: Recent Labs  Lab 11/15/17 1323  AST 14*  ALT 12  ALKPHOS 140*  BILITOT 1.3*  PROT 7.5  ALBUMIN 3.9   Recent Labs  Lab 11/15/17 1323  LIPASE 55*   No results for input(s): AMMONIA in the last 168 hours. Coagulation Profile: Recent Labs  Lab 11/15/17 1323  INR 1.00   Cardiac  Enzymes: Recent Labs  Lab 11/15/17 1323  TROPONINI 0.03*   BNP (last 3 results) No results for input(s): PROBNP in the last 8760 hours. HbA1C: No results for input(s): HGBA1C in the last 72 hours. CBG: No results for input(s): GLUCAP in the last 168 hours. Lipid Profile: No results for input(s): CHOL, HDL, LDLCALC, TRIG, CHOLHDL, LDLDIRECT in the last 72 hours. Thyroid Function Tests: No results for input(s): TSH, T4TOTAL, FREET4, T3FREE, THYROIDAB in the last 72 hours. Anemia Panel: No results for input(s): VITAMINB12, FOLATE, FERRITIN, TIBC, IRON, RETICCTPCT in the last 72 hours. Urine analysis:    Component Value Date/Time   COLORURINE YELLOW 11/15/2017 1252   APPEARANCEUR HAZY (A)  11/15/2017 1252   LABSPEC 1.025 11/15/2017 1252   PHURINE 5.0 11/15/2017 1252   GLUCOSEU 50 (A) 11/15/2017 1252   HGBUR NEGATIVE 11/15/2017 [place] 11/15/2017 1252   KETONESUR NEGATIVE 11/15/2017 1252   PROTEINUR 30 (A) 11/15/2017 1252   UROBILINOGEN 0.2 04/25/2014 0936   NITRITE NEGATIVE 11/15/2017 1252   LEUKOCYTESUR SMALL (A) 11/15/2017 1252    Radiological Exams on Admission: Dg Chest 2 View  Result Date: 11/15/2017 CLINICAL DATA:  Mid chest pain, shortness of breath, dizziness, and fatigue. History of asthma, diabetes, nonsmoker. EXAM: CHEST - 2 VIEW COMPARISON:  Chest x-ray of April 25, 2014 FINDINGS: The lungs are adequately inflated and clear. The cardiac silhouette is enlarged. The pulmonary vascularity is not engorged. There is calcification in the wall of the aortic arch. There is no pleural effusion. There is mild multilevel degenerative disc disease of the thoracic spine. IMPRESSION: Mild cardiomegaly without pulmonary vascular congestion or pulmonary edema. No acute pneumonia. Electronically Signed   By: David  Martinique M.D.   On: 11/15/2017 13:06    EKG: Independently reviewed. Sinus arrhythmia at 89bpm.  Assessment/Plan Principal Problem:   Sepsis  ([place]) Active Problems:   Uncontrolled type 2 diabetes mellitus with hyperglycemia (HCC)   Essential hypertension, benign   Mixed hyperlipidemia   Class 2 severe obesity due to excess calories with serious comorbidity and body mass index (BMI) of 36.0 to 36.9 in adult [facility])   AKI (acute kidney injury) ([place])   Acute lower UTI    1. Severe sepsis secondary to presumed UTI.  Patient has been given 30 cc/kg fluid bolus and ciprofloxacin which was ordered in ED has not yet been given.  I would start broad-spectrum antibiotics instead with vancomycin and cefepime until follow-up with her cultures have resulted given the fact that she does have sepsis and is from a facility.  Notably, she is allergic to penicillins.  Continue on aggressive fluid hydration with repeat lactic acid in a.m. 2. Hyperglycemia in the setting of uncontrolled type 2 diabetes.  Will cut her usual long-acting insulin dose in half to 30 units and maintain on sliding scale insulin every 4 hour checks due to poor oral intake noted on account of nausea and vomiting at this time.  Will maintain on clear liquid diet and advance as tolerated.  Notably, she has had a prior hemoglobin A1c of approximately 12%.  Hold metformin. 3. AKI on CKD stage III.  Likely secondary to severe sepsis from above.  Monitor strict I's and O's and avoid nephrotoxic agents to include her prior lisinopril and Maxzide.  Monitor with repeat renal panel in a.m. 4. History of hypertension with current soft blood pressure readings.  This appears to be related to sepsis and will hold current blood pressure agents and maintain on IV fluid.  Monitor in stepdown unit on account of hypotension.  May require pressors. 5. Mixed hyperlipidemia.  Continue statin. 6. Obesity.   DVT prophylaxis: Heparin Code Status: Full Family Communication:Granddaughter and daughter at bedside  Disposition Plan:Treatment of sepsis  Consults called:None Admission status: Inpatient,  SDU   Katharina Jehle Darleen Crocker DO [facility] Pager [phone]  If 7PM-7AM, please contact night-coverage [email or web address] Password [facility]  11/15/2017, 3:33 PM

## 2017-11-15 NOTE — ED Notes (Signed)
CRITICAL VALUE ALERT  Critical Value:  Lactic 2.8  Date & Time Notied: 11/15/17   Provider Notified: Mcmanus  Orders Received/Actions taken:

## 2017-11-15 NOTE — Progress Notes (Addendum)
Pharmacy Antibiotic Note  Stephanie [name] is a 82 y.o. female admitted on 11/15/2017 with sepsis.  Pharmacy has been consulted for vancomycin and merrem dosing.  Plan: vancomycin 2000mg  iv x 1  Vancomycin 1250mg  IV every 24 hours.  Goal trough 15-20 mcg/mL. merrem 1gm iv q12h   Weight: 228 lb (103.4 kg)  Temp (24hrs), Avg:99.6 F (37.6 C), Min:99.4 F (37.4 C), Max:99.8 F (37.7 C)  Recent Labs  Lab 11/15/17 1323 11/15/17 1502  WBC 21.0*  --   CREATININE 1.54*  --   LATICACIDVEN 3.0* 2.8*    Estimated Creatinine Clearance: 35.2 mL/min (A) (by C-G formula based on SCr of 1.54 mg/dL (H)).    Allergies  Allergen Reactions  . Amoxicillin Swelling    Tongue swelling   . Penicillins Swelling    Has patient had a PCN reaction causing immediate rash, facial/tongue/throat swelling, SOB or lightheadedness with hypotension: No Has patient had a PCN reaction causing severe rash involving mucus membranes or skin necrosis: No Has patient had a PCN reaction that required hospitalization: No Has patient had a PCN reaction occurring within the last 10 years: No If all of the above answers are "NO", then may proceed with Cephalosporin use.   . Sulfa Antibiotics Swelling    Lip swelling    Antimicrobials this admission: 10/29 vancomycin >>  10/29 merrem >>   Microbiology results: 10/29 BCx: sent 10/29 UCx: sent   Thank you for allowing pharmacy to be a part of this patient's care.  Donna Christen Govind Furey 11/15/2017 4:13 PM

## 2017-11-15 NOTE — ED Triage Notes (Signed)
Pt brought in by EMS from [facility] due to CP and cardiac EKG changes. Pt reports weakness,dizziness and chest pain off and on since Sunday. Pt was given baby asa 324 mg and nitro x 1 which relieved the CP. Vomited x 1

## 2017-11-16 LAB — CBC
HCT: 34.5 % — ABNORMAL LOW (ref 36.0–46.0)
Hemoglobin: 10 g/dL — ABNORMAL LOW (ref 12.0–15.0)
MCH: 23.2 pg — ABNORMAL LOW (ref 26.0–34.0)
MCHC: 29 g/dL — ABNORMAL LOW (ref 30.0–36.0)
MCV: 80 fL (ref 80.0–100.0)
Platelets: 286 K/uL (ref 150–400)
RBC: 4.31 MIL/uL (ref 3.87–5.11)
RDW: 16.2 % — ABNORMAL HIGH (ref 11.5–15.5)
WBC: 14.3 K/uL — ABNORMAL HIGH (ref 4.0–10.5)
nRBC: 0.2 % (ref 0.0–0.2)

## 2017-11-16 LAB — GLUCOSE, CAPILLARY
Glucose-Capillary: 96 mg/dL (ref 70–99)
Glucose-Capillary: 79 mg/dL (ref 70–99)
Glucose-Capillary: 110 mg/dL — ABNORMAL HIGH (ref 70–99)
Glucose-Capillary: 180 mg/dL — ABNORMAL HIGH (ref 70–99)
Glucose-Capillary: 167 mg/dL — ABNORMAL HIGH (ref 70–99)
Glucose-Capillary: 221 mg/dL — ABNORMAL HIGH (ref 70–99)

## 2017-11-16 LAB — TROPONIN I: Troponin I: <0.03 ng/mL (ref <0.03)

## 2017-11-16 LAB — BASIC METABOLIC PANEL WITH GFR
Anion gap: 6 (ref 5–15)
BUN: 31 mg/dL — ABNORMAL HIGH (ref 8–23)
CO2: 23 mmol/L (ref 22–32)
Calcium: 8.9 mg/dL (ref 8.9–10.3)
Chloride: 108 mmol/L (ref 98–111)
Creatinine, Ser: 1.12 mg/dL — ABNORMAL HIGH (ref 0.44–1.00)
GFR calc Af Amer: 53 mL/min — ABNORMAL LOW (ref >60)
GFR calc non Af Amer: 45 mL/min — ABNORMAL LOW (ref >60)
Glucose, Bld: 84 mg/dL (ref 70–99)
Potassium: 4.3 mmol/L (ref 3.5–5.1)
Sodium: 137 mmol/L (ref 135–145)

## 2017-11-16 LAB — LACTIC ACID, PLASMA: Lactic Acid, Venous: 1.1 mmol/L (ref 0.5–1.9)

## 2017-11-16 LAB — MRSA PCR SCREENING: MRSA by PCR: NEGATIVE

## 2017-11-16 MED ORDER — INSULIN ASPART 100 UNIT/ML ~~LOC~~ SOLN
0.0000 [IU] | Freq: Three times a day (TID) | SUBCUTANEOUS | Status: DC
  Administered 2017-11-16 (×2): 3 [IU] via SUBCUTANEOUS
  Administered 2017-11-17: 5 [IU] via SUBCUTANEOUS
  Administered 2017-11-17: 3 [IU] via SUBCUTANEOUS

## 2017-11-16 MED ORDER — INSULIN DETEMIR 100 UNIT/ML ~~LOC~~ SOLN
20.0000 [IU] | Freq: Every day | SUBCUTANEOUS | Status: DC
  Administered 2017-11-16: 20 [IU] via SUBCUTANEOUS
  Filled 2017-11-16 (×2): qty 0.2

## 2017-11-16 MED ORDER — INSULIN ASPART 100 UNIT/ML ~~LOC~~ SOLN
0.0000 [IU] | Freq: Every day | SUBCUTANEOUS | Status: DC
  Administered 2017-11-16: 2 [IU] via SUBCUTANEOUS

## 2017-11-16 NOTE — Progress Notes (Signed)
Pts blood sugar at 0100-96, blood sugar at 0400-79. Pt given orange juice 231mL to prevent hypoglycemia. Will continue to monitor pt

## 2017-11-16 NOTE — Progress Notes (Addendum)
PROGRESS NOTE    Stephanie [name]  OHY:[id number] DOB: Jun 17, 1935 DOA: 11/15/2017 PCP: Abran Richard, MD   Brief Narrative:  Per HPI: Stephanie [name] is a 82 y.o. female with medical history significant for DM2 with poor control, hypertension, HLD, and obesity who was brought to the emergency department with complaints of some intermittent chest pain that was bothersome since Sunday this week.  She denies any particular alleviating or exacerbating factors, but did receive some nitroglycerin with resolution of her pain today.  She was noted to have some generalized weakness and fatigue as well as nausea and vomiting.  She states that her chest pain was substernal and was aching in nature with no radiation.  She also describes burning with urination and some increased frequency and urgency, but no hematuria or discharge.  Family members describe that she has been having some trouble with her balance recently.  Patient was admitted with presumed severe sepsis secondary to UTI along with AKI on CKD stage III.  She has been placed on IV fluids as well as vancomycin and Merrem empirically with cultures pending.  She appears to be doing much better this morning and is less symptomatic.  We will plan for diet advancement and transfer to general medical floor today with continuation of antibiotics until urine and blood cultures return.  Assessment & Plan:   Principal Problem:   Sepsis ([place]) Active Problems:   Uncontrolled type 2 diabetes mellitus with hyperglycemia (HCC)   Essential hypertension, benign   Mixed hyperlipidemia   Class 2 severe obesity due to excess calories with serious comorbidity and body mass index (BMI) of 36.0 to 36.9 in adult [facility])   AKI (acute kidney injury) ([place])   Acute lower UTI  1. Severe sepsis secondary to presumed UTI-improving.  Patient is overall much improved with improvement in lactic acid levels this morning as well as WBC count.  Cultures are still pending and  need to be followed.  Will maintain on vancomycin and cefepime until these return as she is from a facility.  May consider discharge on oral antibiotics for presumed UTI in the next 24 to 48 hours if she continues to remain stable. 2. Hyperglycemia in the setting of uncontrolled type 2 diabetes- resolved.  Will cut her usual long-acting insulin dose further to 20 units today on account of some hypoglycemia.  She typically takes 60 units at bedtime at home.  Will advance diet to heart healthy/carb modified today and decrease sliding scale insulin to moderate and monitor carefully.  She does follow-up with Dr. Dorris Fetch in the outpatient setting for diabetes management. 3. AKI on CKD stage III-improved back to baseline.  Likely secondary to severe sepsis from above.  Monitor strict I's and O's and avoid nephrotoxic agents to include her prior lisinopril and Maxzide.  Monitor with repeat renal panel in a.m. DC IV fluid today as diet will be advanced. 4. History of hypertension with current soft blood pressure readings.  This appears to be related to sepsis and will hold current blood pressure agents as her blood pressures are currently soft.  Will resume on discharge blood pressures trend up. 5. Mixed hyperlipidemia.  Continue statin. 6. Obesity.   DVT prophylaxis: Heparin Code Status: Full Family Communication: Spoke with granddaughter and daughter on 10/29 Disposition Plan: Continue to monitor cultures and maintain on broad-spectrum IV antibiotics.  Plan for discharge in 24 to 48 hours if tolerating diet and if patient is afebrile with negative cultures on oral antibiotics.  Consultants:   None  Procedures:   None  Antimicrobials:   Vancomycin and Merrem 10/29->  Vancomycin DC on 10/30   Subjective: Patient seen and evaluated today with no new acute complaints or concerns. No acute concerns or events noted overnight.  She did have some episodes of mild hypoglycemia overnight, but overall has  not been symptomatic from this.  Objective: Vitals:   11/16/17 0208 11/16/17 0300 11/16/17 0400 11/16/17 0500  BP: (!) 122/98 101/61 103/61 (!) 115/52  Pulse: 71 79 96 75  Resp: 19 (!) 30 (!) 23 18  Temp:   98.7 F (37.1 C)   TempSrc:   Oral   SpO2: 97% 91% 97% 97%  Weight:    94.8 kg  Height:        Intake/Output Summary (Last 24 hours) at 11/16/2017 0729 Last data filed at 11/16/2017 0441 Gross per 24 hour  Intake 2460 ml  Output -  Net 2460 ml   Filed Weights   11/15/17 1220 11/15/17 2136 11/16/17 0500  Weight: 103.4 kg 94.8 kg 94.8 kg    Examination:  General exam: Appears calm and comfortable, obese Respiratory system: Clear to auscultation. Respiratory effort normal.  Currently on 2 L nasal cannula. Cardiovascular system: S1 & S2 heard, RRR. No JVD, murmurs, rubs, gallops or clicks. No pedal edema. Gastrointestinal system: Abdomen is nondistended, soft and nontender. No organomegaly or masses felt. Normal bowel sounds heard. Central nervous system: Alert and oriented. No focal neurological deficits. Extremities: Symmetric 5 x 5 power. Skin: No rashes, lesions or ulcers Psychiatry: Judgement and insight appear normal. Mood & affect appropriate.     Data Reviewed: I have personally reviewed following labs and imaging studies  CBC: Recent Labs  Lab 11/15/17 1323 11/16/17 0411  WBC 21.0* 14.3*  NEUTROABS 17.2*  --   HGB 12.2 10.0*  HCT 42.4 34.5*  MCV 78.2* 80.0  PLT 408* 237   Basic Metabolic Panel: Recent Labs  Lab 11/15/17 1323 11/16/17 0411  NA 133* 137  K 4.6 4.3  CL 99 108  CO2 21* 23  GLUCOSE 256* 84  BUN 41* 31*  CREATININE 1.54* 1.12*  CALCIUM 10.0 8.9   GFR: Estimated Creatinine Clearance: 46.4 mL/min (A) (by C-G formula based on SCr of 1.12 mg/dL (H)). Liver Function Tests: Recent Labs  Lab 11/15/17 1323  AST 14*  ALT 12  ALKPHOS 140*  BILITOT 1.3*  PROT 7.5  ALBUMIN 3.9   Recent Labs  Lab 11/15/17 1323  LIPASE 55*    No results for input(s): AMMONIA in the last 168 hours. Coagulation Profile: Recent Labs  Lab 11/15/17 1323  INR 1.00   Cardiac Enzymes: Recent Labs  Lab 11/15/17 1323 11/15/17 1615 11/15/17 2208 11/16/17 0411  TROPONINI 0.03* <0.03 <0.03 <0.03   BNP (last 3 results) No results for input(s): PROBNP in the last 8760 hours. HbA1C: No results for input(s): HGBA1C in the last 72 hours. CBG: Recent Labs  Lab 11/15/17 1642 11/15/17 2037 11/16/17 0122 11/16/17 0429  GLUCAP 185* 270* 96 79   Lipid Profile: No results for input(s): CHOL, HDL, LDLCALC, TRIG, CHOLHDL, LDLDIRECT in the last 72 hours. Thyroid Function Tests: No results for input(s): TSH, T4TOTAL, FREET4, T3FREE, THYROIDAB in the last 72 hours. Anemia Panel: No results for input(s): VITAMINB12, FOLATE, FERRITIN, TIBC, IRON, RETICCTPCT in the last 72 hours. Sepsis Labs: Recent Labs  Lab 11/15/17 1323 11/15/17 1502 11/16/17 0411  LATICACIDVEN 3.0* 2.8* 1.1    Recent Results (from the past  240 hour(s))  MRSA PCR Screening     Status: None   Collection Time: 11/15/17  9:17 PM  Result Value Ref Range Status   MRSA by PCR NEGATIVE NEGATIVE Final    Comment:        The GeneXpert MRSA Assay (FDA approved for NASAL specimens only), is one component of a comprehensive MRSA colonization surveillance program. It is not intended to diagnose MRSA infection nor to guide or monitor treatment for MRSA infections. Performed at [facility], [address], [place], [place] [address]          Radiology Studies: Dg Chest 2 View  Result Date: 11/15/2017 CLINICAL DATA:  Mid chest pain, shortness of breath, dizziness, and fatigue. History of asthma, diabetes, nonsmoker. EXAM: CHEST - 2 VIEW COMPARISON:  Chest x-ray of April 25, 2014 FINDINGS: The lungs are adequately inflated and clear. The cardiac silhouette is enlarged. The pulmonary vascularity is not engorged. There is calcification in the wall of the aortic  arch. There is no pleural effusion. There is mild multilevel degenerative disc disease of the thoracic spine. IMPRESSION: Mild cardiomegaly without pulmonary vascular congestion or pulmonary edema. No acute pneumonia. Electronically Signed   By: David  Martinique M.D.   On: 11/15/2017 13:06        Scheduled Meds: . atorvastatin  20 mg Oral QPM  . heparin  5,000 Units Subcutaneous Q8H  . insulin aspart  0-15 Units Subcutaneous TID WC  . insulin aspart  0-5 Units Subcutaneous QHS  . insulin detemir  20 Units Subcutaneous QHS  . sodium chloride flush  3 mL Intravenous Q12H   Continuous Infusions: . sodium chloride    . meropenem (MERREM) IV 1 g (11/15/17 1817)  . vancomycin       LOS: 1 day    Time spent: 30 minutes    Jacier Gladu Darleen Crocker, DO [facility] Pager [phone]  If 7PM-7AM, please contact night-coverage [email or web address] Password [email or web address] 11/16/2017, 7:29 AM

## 2017-11-17 LAB — GLUCOSE, CAPILLARY
Glucose-Capillary: 152 mg/dL — ABNORMAL HIGH (ref 70–99)
Glucose-Capillary: 155 mg/dL — ABNORMAL HIGH (ref 70–99)
Glucose-Capillary: 211 mg/dL — ABNORMAL HIGH (ref 70–99)

## 2017-11-17 LAB — URINE CULTURE

## 2017-11-17 LAB — BASIC METABOLIC PANEL
Anion gap: 7 (ref 5–15)
BUN: 17 mg/dL (ref 8–23)
CO2: 25 mmol/L (ref 22–32)
Calcium: 9.5 mg/dL (ref 8.9–10.3)
Chloride: 104 mmol/L (ref 98–111)
Creatinine, Ser: 0.86 mg/dL (ref 0.44–1.00)
GFR calc Af Amer: >60 mL/min (ref >60)
GFR calc non Af Amer: >60 mL/min (ref >60)
Glucose, Bld: 162 mg/dL — ABNORMAL HIGH (ref 70–99)
Potassium: 4.7 mmol/L (ref 3.5–5.1)
Sodium: 136 mmol/L (ref 135–145)

## 2017-11-17 LAB — CBC
HCT: 34.6 % — ABNORMAL LOW (ref 36.0–46.0)
Hemoglobin: 10.1 g/dL — ABNORMAL LOW (ref 12.0–15.0)
MCH: 23 pg — ABNORMAL LOW (ref 26.0–34.0)
MCHC: 29.2 g/dL — ABNORMAL LOW (ref 30.0–36.0)
MCV: 78.6 fL — ABNORMAL LOW (ref 80.0–100.0)
Platelets: 278 10*3/uL (ref 150–400)
RBC: 4.4 MIL/uL (ref 3.87–5.11)
RDW: 16.4 % — ABNORMAL HIGH (ref 11.5–15.5)
WBC: 10.3 10*3/uL (ref 4.0–10.5)
nRBC: 0 % (ref 0.0–0.2)

## 2017-11-17 MED ORDER — CIPROFLOXACIN HCL 500 MG PO TABS: 500.0000 mg | ORAL_TABLET | Freq: Two times a day (BID) | ORAL | 0 refills | Status: AC

## 2017-11-17 NOTE — Discharge Summary (Signed)
Physician Discharge Summary  Stephanie [name] CBJ:[id number] DOB: 05/09/1935 DOA: 11/15/2017  PCP: Abran Richard, MD  Admit date: 11/15/2017  Discharge date: 11/17/2017  Admitted From: [place]  Disposition:  [place]  Recommendations for Outpatient Follow-up:  1. Follow up with PCP in 1-2 weeks 2. Continue medications as previously and finish course of antibiotics with ciprofloxacin for 5 more days for total of 7-day course of treatment for UTI.  Home Health: None  Equipment/Devices: None  Discharge Condition: Stable  CODE STATUS: Full  Diet recommendation: Heart Healthy/carb modified  Brief/Interim Summary: Per HPI: Stephanie L Reddingis a 82 y.o.femalewith medical history significant forDM2 with poor control, hypertension, HLD, and obesitywho was brought to the emergency department with complaints of some intermittent chest pain that was bothersome since Sunday this week. She denies any particular alleviating or exacerbating factors, but did receive some nitroglycerin with resolution of her pain today. She was noted to have some generalized weakness and fatigue as well as nausea and vomiting. She states that her chest pain was substernal and was aching in nature with no radiation. She also describes burning with urination and some increased frequency and urgency, but no hematuria or discharge. Family members describe that she has been having some trouble with her balance recently.  Patient was admitted with presumed severe sepsis secondary to UTI along with AKI on CKD stage III.  She was initially placed on vancomycin and Merrem and then vancomycin was discontinued.  She had significant improvement throughout the course of her short stay and urine cultures did demonstrate growth of multiple species.  She will be discharged with empiric ciprofloxacin for 5 more days to complete a 7-day total course of treatment for UTI.  Blood cultures with no growth noted and  patient has remained afebrile.  She was slightly hypotensive and hypoglycemic while initially here, but her blood pressure and blood glucose is now back up and she may resume her usual home blood pressure medication as well as insulin.  Follow-up with PCP in the next 1 to 2 weeks.  Discharge Diagnoses:  Principal Problem:   Sepsis ([place]) Active Problems:   Uncontrolled type 2 diabetes mellitus with hyperglycemia (HCC)   Essential hypertension, benign   Mixed hyperlipidemia   Class 2 severe obesity due to excess calories with serious comorbidity and body mass index (BMI) of 36.0 to 36.9 in adult [facility])   AKI (acute kidney injury) ([place])   Acute lower UTI    Discharge Instructions  Discharge Instructions    Diet - low sodium heart healthy   Complete by:  As directed    Increase activity slowly   Complete by:  As directed      Allergies as of 11/17/2017      Reactions   Amoxicillin Swelling   Tongue swelling   Penicillins Swelling   Has patient had a PCN reaction causing immediate rash, facial/tongue/throat swelling, SOB or lightheadedness with hypotension: No Has patient had a PCN reaction causing severe rash involving mucus membranes or skin necrosis: No Has patient had a PCN reaction that required hospitalization: No Has patient had a PCN reaction occurring within the last 10 years: No If all of the above answers are "NO", then may proceed with Cephalosporin use.   Sulfa Antibiotics Swelling   Lip swelling      Medication List    STOP taking these medications   predniSONE 20 MG tablet Commonly known as:  DELTASONE     TAKE these medications  albuterol (2.5 MG/3ML) 0.083% nebulizer solution Commonly known as:  PROVENTIL Take 2.5 mg by nebulization every 6 (six) hours as needed for wheezing or shortness of breath.   atorvastatin 20 MG tablet Commonly known as:  LIPITOR Take 20 mg by mouth every evening.   ciprofloxacin 500 MG tablet Commonly known as:  CIPRO Take 1  tablet (500 mg total) by mouth 2 (two) times daily for 5 days.   LEVEMIR FLEXTOUCH 100 UNIT/ML Pen Generic drug:  Insulin Detemir Inject 60 Units into the skin at bedtime.   lisinopril 20 MG tablet Commonly known as:  PRINIVIL,ZESTRIL Take 40 mg by mouth daily.   metFORMIN 500 MG tablet Commonly known as:  GLUCOPHAGE Take 500 mg by mouth 2 (two) times daily with a meal.   triamterene-hydrochlorothiazide 37.5-25 MG tablet Commonly known as:  MAXZIDE-25 Take 1 tablet by mouth daily.      Follow-up Information    Abran Richard, MD Follow up in 2 week(s).   Specialty:  Internal Medicine Contact information: [address] [place] [address] [phone]          Allergies  Allergen Reactions  . Amoxicillin Swelling    Tongue swelling   . Penicillins Swelling    Has patient had a PCN reaction causing immediate rash, facial/tongue/throat swelling, SOB or lightheadedness with hypotension: No Has patient had a PCN reaction causing severe rash involving mucus membranes or skin necrosis: No Has patient had a PCN reaction that required hospitalization: No Has patient had a PCN reaction occurring within the last 10 years: No If all of the above answers are "NO", then may proceed with Cephalosporin use.   . Sulfa Antibiotics Swelling    Lip swelling    Consultations:  None   Procedures/Studies: Dg Chest 2 View  Result Date: 11/15/2017 CLINICAL DATA:  Mid chest pain, shortness of breath, dizziness, and fatigue. History of asthma, diabetes, nonsmoker. EXAM: CHEST - 2 VIEW COMPARISON:  Chest x-ray of April 25, 2014 FINDINGS: The lungs are adequately inflated and clear. The cardiac silhouette is enlarged. The pulmonary vascularity is not engorged. There is calcification in the wall of the aortic arch. There is no pleural effusion. There is mild multilevel degenerative disc disease of the thoracic spine. IMPRESSION: Mild cardiomegaly without pulmonary vascular  congestion or pulmonary edema. No acute pneumonia. Electronically Signed   By: David  Martinique M.D.   On: 11/15/2017 13:06     Discharge Exam: Vitals:   11/17/17 0534 11/17/17 0900  BP: (!) 141/127 134/61  Pulse: 73 82  Resp: 20 18  Temp: 98.1 F (36.7 C) 97.9 F (36.6 C)  SpO2: 97% 100%   Vitals:   11/16/17 1639 11/16/17 2124 11/17/17 0534 11/17/17 0900  BP:  112/73 (!) 141/127 134/61  Pulse:  75 73 82  Resp:  20 20 18   Temp: 98.1 F (36.7 C) 98.6 F (37 C) 98.1 F (36.7 C) 97.9 F (36.6 C)  TempSrc: Oral Oral Oral Oral  SpO2:  98% 97% 100%  Weight:      Height:        General: Pt is alert, awake, not in acute distress Cardiovascular: RRR, S1/S2 +, no rubs, no gallops Respiratory: CTA bilaterally, no wheezing, no rhonchi Abdominal: Soft, NT, ND, bowel sounds + Extremities: no edema, no cyanosis    The results of significant diagnostics from this hospitalization (including imaging, microbiology, ancillary and laboratory) are listed below for reference.     Microbiology: Recent Results (from the  past 240 hour(s))  Urine culture     Status: Abnormal   Collection Time: 11/15/17 12:52 PM  Result Value Ref Range Status   Specimen Description   Final    URINE, CLEAN CATCH Performed at [facility], [address] [place], [place] [address]    Special Requests   Final    NONE Performed at [facility], [address], [place], [place] [address]    Culture MULTIPLE SPECIES PRESENT, SUGGEST RECOLLECTION (A)  Final   Report Status 11/17/2017 FINAL  Final  Culture, blood (routine x 2)     Status: None (Preliminary result)   Collection Time: 11/15/17  2:56 PM  Result Value Ref Range Status   Specimen Description BLOOD RIGHT ANTECUBITAL  Final   Special Requests   Final    BOTTLES DRAWN AEROBIC AND ANAEROBIC Blood Culture adequate volume   Culture   Final    NO GROWTH 2 DAYS Performed at [facility], [address] [place], [place] [address]    Report Status  PENDING  Incomplete  Culture, blood (routine x 2)     Status: None (Preliminary result)   Collection Time: 11/15/17  3:10 PM  Result Value Ref Range Status   Specimen Description BLOOD LEFT HAND  Final   Special Requests   Final    BOTTLES DRAWN AEROBIC ONLY Blood Culture adequate volume   Culture   Final    NO GROWTH 2 DAYS Performed at [facility], [address] [place], [place] [address]    Report Status PENDING  Incomplete  MRSA PCR Screening     Status: None   Collection Time: 11/15/17  9:17 PM  Result Value Ref Range Status   MRSA by PCR NEGATIVE NEGATIVE Final    Comment:        The GeneXpert MRSA Assay (FDA approved for NASAL specimens only), is one component of a comprehensive MRSA colonization surveillance program. It is not intended to diagnose MRSA infection nor to guide or monitor treatment for MRSA infections. Performed at [facility], [address], [place], [place] [address]      Labs: BNP (last 3 results) No results for input(s): BNP in the last 8760 hours. Basic Metabolic Panel: Recent Labs  Lab 11/15/17 1323 11/16/17 0411 11/17/17 0409  NA 133* 137 136  K 4.6 4.3 4.7  CL 99 108 104  CO2 21* 23 25  GLUCOSE 256* 84 162*  BUN 41* 31* 17  CREATININE 1.54* 1.12* 0.86  CALCIUM 10.0 8.9 9.5   Liver Function Tests: Recent Labs  Lab 11/15/17 1323  AST 14*  ALT 12  ALKPHOS 140*  BILITOT 1.3*  PROT 7.5  ALBUMIN 3.9   Recent Labs  Lab 11/15/17 1323  LIPASE 55*   No results for input(s): AMMONIA in the last 168 hours. CBC: Recent Labs  Lab 11/15/17 1323 11/16/17 0411 11/17/17 0409  WBC 21.0* 14.3* 10.3  NEUTROABS 17.2*  --   --   HGB 12.2 10.0* 10.1*  HCT 42.4 34.5* 34.6*  MCV 78.2* 80.0 78.6*  PLT 408* 286 278   Cardiac Enzymes: Recent Labs  Lab 11/15/17 1323 11/15/17 1615 11/15/17 2208 11/16/17 0411  TROPONINI 0.03* <0.03 <0.03 <0.03   BNP: Invalid input(s): POCBNP CBG: Recent Labs  Lab 11/16/17 0429 11/16/17 0749  11/16/17 1151 11/16/17 1638 11/16/17 2008  GLUCAP 79 110* 180* 167* 221*   D-Dimer No results for input(s): DDIMER in the last 72 hours. Hgb A1c No results for input(s): HGBA1C  in the last 72 hours. Lipid Profile No results for input(s): CHOL, HDL, LDLCALC, TRIG, CHOLHDL, LDLDIRECT in the last 72 hours. Thyroid function studies No results for input(s): TSH, T4TOTAL, T3FREE, THYROIDAB in the last 72 hours.  Invalid input(s): FREET3 Anemia work up No results for input(s): VITAMINB12, FOLATE, FERRITIN, TIBC, IRON, RETICCTPCT in the last 72 hours. Urinalysis    Component Value Date/Time   COLORURINE YELLOW 11/15/2017 1252   APPEARANCEUR HAZY (A) 11/15/2017 1252   LABSPEC 1.025 11/15/2017 1252   PHURINE 5.0 11/15/2017 1252   GLUCOSEU 50 (A) 11/15/2017 1252   HGBUR NEGATIVE 11/15/2017 1252   [place] 11/15/2017 1252   KETONESUR NEGATIVE 11/15/2017 1252   PROTEINUR 30 (A) 11/15/2017 1252   UROBILINOGEN 0.2 04/25/2014 0936   NITRITE NEGATIVE 11/15/2017 1252   LEUKOCYTESUR SMALL (A) 11/15/2017 1252   Sepsis Labs Invalid input(s): PROCALCITONIN,  WBC,  LACTICIDVEN Microbiology Recent Results (from the past 240 hour(s))  Urine culture     Status: Abnormal   Collection Time: 11/15/17 12:52 PM  Result Value Ref Range Status   Specimen Description   Final    URINE, CLEAN CATCH Performed at [facility], [address], [place], [place] [address]    Special Requests   Final    NONE Performed at [facility], [address], [place], [place] [address]    [place], SUGGEST RECOLLECTION (A)  Final   Report Status 11/17/2017 FINAL  Final  Culture, blood (routine x 2)     Status: None (Preliminary result)   Collection Time: 11/15/17  2:56 PM  Result Value Ref Range Status   Specimen Description BLOOD RIGHT ANTECUBITAL  Final   Special Requests   Final    BOTTLES DRAWN AEROBIC AND ANAEROBIC Blood Culture adequate volume   Culture   Final    NO  GROWTH 2 DAYS Performed at [facility], [address] [place], [place] [address]    Report Status PENDING  Incomplete  Culture, blood (routine x 2)     Status: None (Preliminary result)   Collection Time: 11/15/17  3:10 PM  Result Value Ref Range Status   Specimen Description BLOOD LEFT HAND  Final   Special Requests   Final    BOTTLES DRAWN AEROBIC ONLY Blood Culture adequate volume   Culture   Final    NO GROWTH 2 DAYS Performed at [facility], [address], [place], [place] [address]    Report Status PENDING  Incomplete  MRSA PCR Screening     Status: None   Collection Time: 11/15/17  9:17 PM  Result Value Ref Range Status   MRSA by PCR NEGATIVE NEGATIVE Final    Comment:        The GeneXpert MRSA Assay (FDA approved for NASAL specimens only), is one component of a comprehensive MRSA colonization surveillance program. It is not intended to diagnose MRSA infection nor to guide or monitor treatment for MRSA infections. Performed at [facility], [address], [place], [place] [address]      Time coordinating discharge: 35 minutes  SIGNED:   Rodena Goldmann, DO [facility] 11/17/2017, 9:36 AM Pager [phone]  If 7PM-7AM, please contact night-coverage [email or web address] Password [email or web address]

## 2017-11-20 LAB — CULTURE, BLOOD (ROUTINE X 2)
Culture: NO GROWTH
Culture: NO GROWTH
Special Requests: ADEQUATE
Special Requests: ADEQUATE

## 2018-08-17 ENCOUNTER — Emergency Department (HOSPITAL_COMMUNITY): Payer: Medicare HMO

## 2018-08-17 ENCOUNTER — Encounter (HOSPITAL_COMMUNITY): Payer: Self-pay | Admitting: Emergency Medicine

## 2018-08-17 ENCOUNTER — Other Ambulatory Visit: Payer: Self-pay

## 2018-08-17 ENCOUNTER — Emergency Department (HOSPITAL_COMMUNITY)
Admission: EM | Admit: 2018-08-17 | Discharge: 2018-08-17 | Disposition: A | Discharge status: Home / Self Care | Payer: Medicare HMO | Attending: Emergency Medicine | Admitting: Emergency Medicine

## 2018-08-17 DIAGNOSIS — R1032 Left lower quadrant pain: Secondary | ICD-10-CM | POA: Diagnosis not present

## 2018-08-17 DIAGNOSIS — E119 Type 2 diabetes mellitus without complications: Secondary | ICD-10-CM | POA: Diagnosis not present

## 2018-08-17 DIAGNOSIS — R001 Bradycardia, unspecified: Secondary | ICD-10-CM | POA: Insufficient documentation

## 2018-08-17 DIAGNOSIS — Z79899 Other long term (current) drug therapy: Secondary | ICD-10-CM | POA: Insufficient documentation

## 2018-08-17 DIAGNOSIS — Z7984 Long term (current) use of oral hypoglycemic drugs: Secondary | ICD-10-CM | POA: Insufficient documentation

## 2018-08-17 DIAGNOSIS — J45909 Unspecified asthma, uncomplicated: Secondary | ICD-10-CM | POA: Insufficient documentation

## 2018-08-17 DIAGNOSIS — I10 Essential (primary) hypertension: Secondary | ICD-10-CM | POA: Diagnosis not present

## 2018-08-17 DIAGNOSIS — N898 Other specified noninflammatory disorders of vagina: Secondary | ICD-10-CM

## 2018-08-17 LAB — COMPREHENSIVE METABOLIC PANEL
ALT: 16 U/L (ref 0–44)
AST: 19 U/L (ref 15–41)
Albumin: 3.9 g/dL (ref 3.5–5.0)
Alkaline Phosphatase: 170 U/L — ABNORMAL HIGH (ref 38–126)
Anion gap: 9 (ref 5–15)
BUN: 18 mg/dL (ref 8–23)
CO2: 23 mmol/L (ref 22–32)
Calcium: 10 mg/dL (ref 8.9–10.3)
Chloride: 99 mmol/L (ref 98–111)
Creatinine, Ser: 1.26 mg/dL — ABNORMAL HIGH (ref 0.44–1.00)
GFR calc Af Amer: 47 mL/min — ABNORMAL LOW (ref >60)
GFR calc non Af Amer: 40 mL/min — ABNORMAL LOW (ref >60)
Glucose, Bld: 260 mg/dL — ABNORMAL HIGH (ref 70–99)
Potassium: 5.1 mmol/L (ref 3.5–5.1)
Sodium: 131 mmol/L — ABNORMAL LOW (ref 135–145)
Total Bilirubin: 0.6 mg/dL (ref 0.3–1.2)
Total Protein: 7.6 g/dL (ref 6.5–8.1)

## 2018-08-17 LAB — CBC
HCT: 30.7 % — ABNORMAL LOW (ref 36.0–46.0)
Hemoglobin: 8.6 g/dL — ABNORMAL LOW (ref 12.0–15.0)
MCH: 18.8 pg — ABNORMAL LOW (ref 26.0–34.0)
MCHC: 28 g/dL — ABNORMAL LOW (ref 30.0–36.0)
MCV: 67.2 fL — ABNORMAL LOW (ref 80.0–100.0)
Platelets: 358 10*3/uL (ref 150–400)
RBC: 4.57 MIL/uL (ref 3.87–5.11)
RDW: 20.1 % — ABNORMAL HIGH (ref 11.5–15.5)
WBC: 13.7 10*3/uL — ABNORMAL HIGH (ref 4.0–10.5)
nRBC: 0.2 % (ref 0.0–0.2)

## 2018-08-17 LAB — WET PREP, GENITAL
Clue Cells Wet Prep HPF POC: NONE SEEN
Sperm: NONE SEEN
Trich, Wet Prep: NONE SEEN
Yeast Wet Prep HPF POC: NONE SEEN

## 2018-08-17 LAB — URINALYSIS, ROUTINE W REFLEX MICROSCOPIC
Bilirubin Urine: NEGATIVE
Glucose, UA: NEGATIVE mg/dL
Hgb urine dipstick: NEGATIVE
Ketones, ur: NEGATIVE mg/dL
Nitrite: NEGATIVE
Protein, ur: NEGATIVE mg/dL
Specific Gravity, Urine: 1.018 (ref 1.005–1.030)
pH: 5 (ref 5.0–8.0)

## 2018-08-17 LAB — LIPASE, BLOOD: Lipase: 51 U/L (ref 11–51)

## 2018-08-17 MED ORDER — TRAMADOL HCL 50 MG PO TABS: 50.0000 mg | ORAL_TABLET | Freq: Four times a day (QID) | ORAL | 0 refills | Status: DC | PRN

## 2018-08-17 MED ORDER — SODIUM CHLORIDE 0.9% FLUSH: 3.0000 mL | Freq: Once | INTRAVENOUS | Status: DC

## 2018-08-17 MED ORDER — IOHEXOL 300 MG/ML  SOLN
80.0000 mL | Freq: Once | INTRAMUSCULAR | Status: AC | PRN
  Administered 2018-08-17: 80 mL via INTRAVENOUS

## 2018-08-17 NOTE — ED Notes (Signed)
Pt discharge instructions review and no questions at this time. Pt unable to sign due to signature pad not working.

## 2018-08-17 NOTE — ED Provider Notes (Signed)
   patient signed out to me by Evalee Jefferson, PA-C at end of shift.    patient is a 83 yo obese female.   She was recently treated with abx for UTI and reports no improvement in her symptoms.  She woke up this morning with left lower abdominal pain.  Concern for diverticulitis, CT abd/pelvis is pending.     1840  I was informed by nursing that pt was sitting on the stretcher and had a single, brief episode of bradycardia just before going to CT, nursing staff did EKG, HR 78 see Dr. Gloris [place] interpretation.  Pt denied having symptoms  and no further bradycardia.  No chest pain or dyspnea   Ct Abdomen Pelvis W Contrast  Result Date: 08/17/2018 CLINICAL DATA:  Acute onset lower abdominal pain this morning. EXAM: CT ABDOMEN AND PELVIS WITH CONTRAST TECHNIQUE: Multidetector CT imaging of the abdomen and pelvis was performed using the standard protocol following bolus administration of intravenous contrast. CONTRAST:  80 mL OMNIPAQUE IOHEXOL 300 MG/ML  SOLN COMPARISON:  Chest and two views abdomen 04/25/2014. FINDINGS: Lower chest: Heart size is upper normal. No pericardial effusion. Nodule in the right lower lobe measures 1.3 x 1.0 cm on image 14 of series 5. Lung bases are otherwise clear. Hepatobiliary: The gallbladder and biliary tree appear normal. Scattered small hepatic cysts are seen. The liver is otherwise unremarkable. Pancreas: Unremarkable. No pancreatic ductal dilatation or surrounding inflammatory changes. Spleen: Normal in size without focal abnormality. Adrenals/Urinary Tract: Small left adrenal adenoma is noted. The right adrenal gland appears normal. Low attenuating lesions in the kidneys are most compatible with cysts. Largest single cyst is in the right kidney and measures 2.6 cm in diameter. Ureters and urinary bladder are unremarkable. Stomach/Bowel: Stomach is within normal limits. The appendix has been removed. No evidence of bowel wall thickening, distention, or inflammatory changes.  Diverticulosis without diverticulitis is extensive in the sigmoid colon. Vascular/Lymphatic: No significant vascular findings are present. No enlarged abdominal or pelvic lymph nodes. Reproductive: Uterus and bilateral adnexa are unremarkable. Other: None. Musculoskeletal: No acute abnormality. The patient is status post L2-S1 fusion. Sclerosis and mild widening of the right SI joint is most consistent with remote sacroiliitis. IMPRESSION: No acute abnormality or finding to explain the patient's symptoms. 1.3 cm right lower lobe pulmonary nodule. Consider one of the following in 3 months for both low-risk and high-risk individuals: (a) repeat chest CT, (b) follow-up PET-CT, or (c) tissue sampling. This recommendation follows the consensus statement: Guidelines for Management of Incidental Pulmonary Nodules Detected on CT Images: From the Fleischner Society 2017; Radiology 2017; 284:228-243. Diverticulosis without diverticulitis. An attic and renal cyst. Atherosclerosis. Electronically Signed   By: Inge Rise M.D.   On: 08/17/2018 19:33    Small pulmonary nodule seen in the right lower lobe.  I have discussed CT findings with patient.  She has PCP and agrees to follow-up and repeat CT or chest x-ray in 3 months.  Patient is well-appearing ambulatory and talking with family members at bedside.  I feel she is appropriate for discharge home and she agrees to outpatient follow-up.  Return precautions discussed.   Pt also seen by Dr. Rogene Houston and care plan discussed.     Kem Parkinson, PA-C 08/17/18 1955    Fredia Sorrow, MD 08/23/18 [phone]

## 2018-08-17 NOTE — Discharge Instructions (Addendum)
Call your primary care provider to arrange a follow-up appointment.  Your CT today shows a small nodule in your right lower lobe.  This will need follow-up by your primary doctor in 3 months.  You may also use over-the-counter vagisil cream as directed if needed for vaginal irritation.

## 2018-08-17 NOTE — ED Triage Notes (Signed)
Patient states she woke up with L lower abdominal pain this morning. Patient was treated for UTI and finished medication last week, states her symptoms are no better. Denies hematuria, afebrile in Triage.

## 2018-08-17 NOTE — ED Provider Notes (Signed)
[facility] EMERGENCY DEPARTMENT Provider Note   CSN: [id number] Arrival date & time: 08/17/18  1320    History   Chief Complaint Chief Complaint  Patient presents with  . Abdominal Pain    HPI Stephanie [name] is a 83 y.o. female with a history significant for diabetes, asthma, hypertension, hyperlipidemia and surgical history significant for appendectomy presenting with left lower quadrant pain which she woke with this morning.  She reports treated for UTI approximately 2 weeks ago, completed a course of Keflex (?pt believes), then developed vaginal itching and watery discharge and was treated with a Diflucan tablet.  She reports that her UTI symptoms never resolved and she continues to have vaginal irritation as well.  She describes frequent urination and pain with urination which seems more prominent at night, reporting she has to frequently get up at night to urinate.  She denies fevers or chills, also no nausea or vomiting.  She has had diarrhea or constipation, her last bowel movement was yesterday and normal.  She denies hematuria but states her urine has been cloudy.  Her new left lower quadrant pain is sharp, constant and waxing and waning, worsened with movement and deep palpation.  She has had no medications for her symptoms.  Patient has not been sexually active in years.     The history is provided by the patient.    Past Medical History:  Diagnosis Date  . Arthritis   . Asthma   . Diabetes mellitus   . Diabetes mellitus without complication ([place])   . Hypercholesteremia   . Hyperlipemia   . Hypertension     Patient Active Problem List   Diagnosis Date Noted  . Sepsis ([place]) 11/15/2017  . AKI (acute kidney injury) ([place]) 11/15/2017  . Acute lower UTI 11/15/2017  . Uncontrolled type 2 diabetes mellitus with hyperglycemia ([place]) 07/06/2017  . Essential hypertension, benign 07/06/2017  . Mixed hyperlipidemia 07/06/2017  . Class 2 severe obesity due to excess calories  with serious comorbidity and body mass index (BMI) of 36.0 to 36.9 in adult ([place]) 07/06/2017  . Medial meniscus, posterior horn derangement 04/12/2012  . OA (osteoarthritis) of knee 04/12/2012    Past Surgical History:  Procedure Laterality Date  . APPENDECTOMY    . CYST REMOVAL HAND     Right, hospital in [place] ARTHROSCOPY WITH MEDIAL MENISECTOMY Right 02/28/2012   Procedure: KNEE ARTHROSCOPY WITH MEDIAL MENISECTOMY;  Surgeon: Carole Civil, MD;  Location: [facility];  Service: Orthopedics;  Laterality: Right;  . REPAIR KNEE LIGAMENT       OB History    Gravida  3   Para  3   Term  3   Preterm      AB      Living        SAB      TAB      Ectopic      Multiple      Live Births               Home Medications    Prior to Admission medications   Medication Sig Start Date End Date Taking? Authorizing Provider  albuterol (PROVENTIL) (2.5 MG/3ML) 0.083% nebulizer solution Take 2.5 mg by nebulization every 6 (six) hours as needed for wheezing or shortness of breath.  10/20/17  Yes [provider]  atorvastatin (LIPITOR) 20 MG tablet Take 20 mg by mouth every evening.    Yes [provider]  lisinopril (PRINIVIL,ZESTRIL) 20  MG tablet Take 40 mg by mouth daily.   Yes [provider]  metFORMIN (GLUCOPHAGE) 500 MG tablet Take 500 mg by mouth 2 (two) times daily with a meal.   Yes [provider]  triamterene-hydrochlorothiazide (MAXZIDE-25) 37.5-25 MG per tablet Take 1 tablet by mouth daily.   Yes [provider]  LEVEMIR FLEXTOUCH 100 UNIT/ML Pen Inject 60 Units into the skin at bedtime. 09/15/13   [provider]    Family History Family History  Problem Relation Age of Onset  . Lung disease Other   . Cancer Other   . Arthritis Other   . Asthma Other   . Diabetes Other   . Diabetes Mother   . Hypertension Mother   . Diabetes Father   . Hypertension Sister   . Diabetes Brother     Social History  Social History   Tobacco Use  . Smoking status: Never Smoker  . Smokeless tobacco: Never Used  Substance Use Topics  . Alcohol use: No  . Drug use: No     Allergies   Amoxicillin, Penicillins, and Sulfa antibiotics   Review of Systems Review of Systems  Constitutional: Negative for chills and fever.  HENT: Negative for congestion and sore throat.   Eyes: Negative.   Respiratory: Negative for chest tightness and shortness of breath.   Cardiovascular: Negative for chest pain.  Gastrointestinal: Positive for abdominal pain. Negative for constipation, diarrhea, nausea and vomiting.  Genitourinary: Positive for dysuria, frequency and vaginal discharge. Negative for hematuria.  Musculoskeletal: Negative for arthralgias, joint swelling and neck pain.  Skin: Negative.  Negative for rash and wound.  Neurological: Negative for dizziness, weakness, light-headedness, numbness and headaches.  Psychiatric/Behavioral: Negative.      Physical Exam Updated Vital Signs BP (!) 156/67 (BP Location: Right Arm)   Pulse 87   Temp 98.2 F (36.8 C) (Oral)   Resp 18   SpO2 95%   Physical Exam Vitals signs and nursing note reviewed.  Constitutional:      Appearance: She is well-developed.  HENT:     Head: Normocephalic and atraumatic.  Eyes:     Conjunctiva/sclera: Conjunctivae normal.  Neck:     Musculoskeletal: Normal range of motion.  Cardiovascular:     Rate and Rhythm: Normal rate and regular rhythm.     Heart sounds: Normal heart sounds.  Pulmonary:     Effort: Pulmonary effort is normal.     Breath sounds: Normal breath sounds. No wheezing.  Abdominal:     General: Abdomen is protuberant. Bowel sounds are normal. There is no distension.     Palpations: Abdomen is soft.     Tenderness: There is abdominal tenderness in the left lower quadrant. There is no right CVA tenderness, left CVA tenderness, guarding or rebound.  Genitourinary:    Cervix: Friability present.     Uterus:  Normal. Not tender.      Adnexa: Right adnexa normal and left adnexa normal.       Right: No tenderness.         Left: No tenderness.    Musculoskeletal: Normal range of motion.  Skin:    General: Skin is warm and dry.  Neurological:     Mental Status: She is alert.      ED Treatments / Results  Labs (all labs ordered are listed, but only abnormal results are displayed) Labs Reviewed  WET PREP, GENITAL - Abnormal; Notable for the following components:      Result Value  WBC, Wet Prep HPF POC FEW (*)    All other components within normal limits  COMPREHENSIVE METABOLIC PANEL - Abnormal; Notable for the following components:   Sodium 131 (*)    Glucose, Bld 260 (*)    Creatinine, Ser 1.26 (*)    Alkaline Phosphatase 170 (*)    GFR calc non Af Amer 40 (*)    GFR calc Af Amer 47 (*)    All other components within normal limits  CBC - Abnormal; Notable for the following components:   WBC 13.7 (*)    Hemoglobin 8.6 (*)    HCT 30.7 (*)    MCV 67.2 (*)    MCH 18.8 (*)    MCHC 28.0 (*)    RDW 20.1 (*)    All other components within normal limits  URINALYSIS, ROUTINE W REFLEX MICROSCOPIC - Abnormal; Notable for the following components:   Leukocytes,Ua TRACE (*)    Bacteria, UA RARE (*)    All other components within normal limits  LIPASE, BLOOD    EKG None  Radiology No results found.  Procedures Procedures (including critical care time)  Medications Ordered in ED Medications  sodium chloride flush (NS) 0.9 % injection 3 mL (has no administration in time range)     Initial Impression / Assessment and Plan / ED Course  I have reviewed the triage vital signs and the nursing notes.  Pertinent labs & imaging results that were available during my care of the patient were reviewed by me and considered in my medical decision making (see chart for details).        Pt with dysuria, vaginal dc which has been chronic for the past 1-2 weeks with no resolution from  keflex and diflucan, now with LLQ pain.  Pending CT imaging to assess for diverticulitis or other source of sx.   Discussed with Kem Parkinson PA-C who assumes care.   Final Clinical Impressions(s) / ED Diagnoses   Final diagnoses:  Left lower quadrant abdominal pain    ED Discharge Orders    None       Landis Martins 08/17/18 1700    Fredia Sorrow, MD 08/17/18 1945

## 2018-11-26 ENCOUNTER — Encounter (HOSPITAL_COMMUNITY): Payer: Self-pay | Admitting: Emergency Medicine

## 2018-11-26 ENCOUNTER — Emergency Department (HOSPITAL_COMMUNITY): Payer: Medicare HMO

## 2018-11-26 ENCOUNTER — Emergency Department (HOSPITAL_COMMUNITY)
Admission: EM | Admit: 2018-11-26 | Discharge: 2018-11-26 | Disposition: A | Discharge status: Home / Self Care | Payer: Medicare HMO | Attending: Emergency Medicine | Admitting: Emergency Medicine

## 2018-11-26 ENCOUNTER — Other Ambulatory Visit: Payer: Self-pay

## 2018-11-26 DIAGNOSIS — J45909 Unspecified asthma, uncomplicated: Secondary | ICD-10-CM | POA: Insufficient documentation

## 2018-11-26 DIAGNOSIS — Z79899 Other long term (current) drug therapy: Secondary | ICD-10-CM | POA: Insufficient documentation

## 2018-11-26 DIAGNOSIS — I10 Essential (primary) hypertension: Secondary | ICD-10-CM | POA: Insufficient documentation

## 2018-11-26 DIAGNOSIS — N390 Urinary tract infection, site not specified: Secondary | ICD-10-CM | POA: Diagnosis not present

## 2018-11-26 DIAGNOSIS — Z794 Long term (current) use of insulin: Secondary | ICD-10-CM | POA: Diagnosis not present

## 2018-11-26 DIAGNOSIS — K5732 Diverticulitis of large intestine without perforation or abscess without bleeding: Secondary | ICD-10-CM | POA: Insufficient documentation

## 2018-11-26 DIAGNOSIS — K5792 Diverticulitis of intestine, part unspecified, without perforation or abscess without bleeding: Secondary | ICD-10-CM

## 2018-11-26 DIAGNOSIS — E119 Type 2 diabetes mellitus without complications: Secondary | ICD-10-CM | POA: Diagnosis not present

## 2018-11-26 DIAGNOSIS — R1031 Right lower quadrant pain: Secondary | ICD-10-CM | POA: Diagnosis present

## 2018-11-26 LAB — CBC
HCT: 30.3 % — ABNORMAL LOW (ref 36.0–46.0)
Hemoglobin: 8.1 g/dL — ABNORMAL LOW (ref 12.0–15.0)
MCH: 17.5 pg — ABNORMAL LOW (ref 26.0–34.0)
MCHC: 26.7 g/dL — ABNORMAL LOW (ref 30.0–36.0)
MCV: 65.4 fL — ABNORMAL LOW (ref 80.0–100.0)
Platelets: 427 10*3/uL — ABNORMAL HIGH (ref 150–400)
RBC: 4.63 MIL/uL (ref 3.87–5.11)
RDW: 20.6 % — ABNORMAL HIGH (ref 11.5–15.5)
WBC: 14.1 10*3/uL — ABNORMAL HIGH (ref 4.0–10.5)
nRBC: 0.3 % — ABNORMAL HIGH (ref 0.0–0.2)

## 2018-11-26 LAB — COMPREHENSIVE METABOLIC PANEL
ALT: 11 U/L (ref 0–44)
AST: 16 U/L (ref 15–41)
Albumin: 4 g/dL (ref 3.5–5.0)
Alkaline Phosphatase: 129 U/L — ABNORMAL HIGH (ref 38–126)
Anion gap: 7 (ref 5–15)
BUN: 19 mg/dL (ref 8–23)
CO2: 24 mmol/L (ref 22–32)
Calcium: 10.9 mg/dL — ABNORMAL HIGH (ref 8.9–10.3)
Chloride: 101 mmol/L (ref 98–111)
Creatinine, Ser: 1.14 mg/dL — ABNORMAL HIGH (ref 0.44–1.00)
GFR calc Af Amer: 53 mL/min — ABNORMAL LOW (ref >60)
GFR calc non Af Amer: 45 mL/min — ABNORMAL LOW (ref >60)
Glucose, Bld: 102 mg/dL — ABNORMAL HIGH (ref 70–99)
Potassium: 5.4 mmol/L — ABNORMAL HIGH (ref 3.5–5.1)
Sodium: 132 mmol/L — ABNORMAL LOW (ref 135–145)
Total Bilirubin: 0.8 mg/dL (ref 0.3–1.2)
Total Protein: 7.7 g/dL (ref 6.5–8.1)

## 2018-11-26 LAB — URINALYSIS, ROUTINE W REFLEX MICROSCOPIC
Bilirubin Urine: NEGATIVE
Glucose, UA: NEGATIVE mg/dL
Hgb urine dipstick: NEGATIVE
Ketones, ur: NEGATIVE mg/dL
Nitrite: NEGATIVE
Protein, ur: NEGATIVE mg/dL
Specific Gravity, Urine: 1.015 (ref 1.005–1.030)
pH: 5 (ref 5.0–8.0)

## 2018-11-26 LAB — LIPASE, BLOOD: Lipase: 43 U/L (ref 11–51)

## 2018-11-26 MED ORDER — CIPROFLOXACIN HCL 500 MG PO TABS: 500.0000 mg | ORAL_TABLET | Freq: Two times a day (BID) | ORAL | 0 refills | Status: AC

## 2018-11-26 MED ORDER — CIPROFLOXACIN HCL 250 MG PO TABS
500.0000 mg | ORAL_TABLET | Freq: Once | ORAL | Status: AC
  Administered 2018-11-26: 500 mg via ORAL
  Filled 2018-11-26: qty 2

## 2018-11-26 MED ORDER — IOHEXOL 300 MG/ML  SOLN
100.0000 mL | Freq: Once | INTRAMUSCULAR | Status: AC | PRN
  Administered 2018-11-26: 100 mL via INTRAVENOUS

## 2018-11-26 MED ORDER — METRONIDAZOLE 500 MG PO TABS
500.0000 mg | ORAL_TABLET | Freq: Once | ORAL | Status: AC
  Administered 2018-11-26: 500 mg via ORAL
  Filled 2018-11-26: qty 1

## 2018-11-26 MED ORDER — METRONIDAZOLE 500 MG PO TABS: 500.0000 mg | ORAL_TABLET | Freq: Three times a day (TID) | ORAL | 0 refills | Status: AC

## 2018-11-26 NOTE — Discharge Instructions (Addendum)
Please complete the entire course of antibiotics regardless of symptom improvement to prevent worsening or recurrence of your infection. Follow-up with your primary care provider. Return to the ED if you start to have worsening symptoms, develop a fever or worsening abdominal pain, chest pain or shortness of breath.

## 2018-11-26 NOTE — ED Provider Notes (Signed)
Medical screening examination/treatment/procedure(s) were conducted as a shared visit with non-physician practitioner(s) and myself.  I personally evaluated the patient during the encounter.      Patient seen by me along with the physician assistant.  Patient actually with a complaint of left-sided discomfort also does point some to the right side as well.  She states it is actually been there for a while but she does report some nausea that started today when she woke up.  No fevers no cough no congestion.  Abdomen without significant tenderness.  CT scan of the abdomen raises concern for mild diverticulitis.  This could explain her pain.  Also urinalysis looks like urinary tract infection.  Will send for culture.  Patient will be treated with Cipro and Flagyl.  The Cipro should help cover the urinary tract infection as well.  And then follow-up with her doctors.  Patient nontoxic no acute distress.   Fredia Sorrow, MD 11/26/18 2112

## 2018-11-26 NOTE — ED Triage Notes (Signed)
Pt reports left side that is still present since last visit in July for same. Pt reports nausea that started today when she woke up.

## 2018-11-26 NOTE — ED Provider Notes (Signed)
[facility] EMERGENCY DEPARTMENT Provider Note   CSN: [id number] Arrival date & time: 11/26/18  1611     History   Chief Complaint Chief Complaint  Patient presents with   Abdominal Pain    HPI Stephanie [name] is a 83 y.o. female with a past medical history of diverticulosis, diabetes, hypertension presenting to the ED with a chief complaint of right-sided abdominal pain.  Describes the pain as sharp, intermittent, worse with laying down and improved with standing up.  States that she has had intermittent similar's episodes since July 2020.  She was seen and evaluated in July 2020, diagnosed with diverticulosis without diverticulitis, prescribed tramadol.  States that she was unable to take the medication as she states that "it was too strong for me, I did not like it."  She does report urinary frequency for the past several days.  Started having nausea yesterday but denies any vomiting.  Denies fever or prior abdominal surgeries.  No sick contacts with similar symptoms.  She has not followed up with a GI specialist about her symptoms in the past.  Denies chest pain, shortness of breath, hematuria, history of kidney stones, hematochezia or melena, dysuria or back pain.     HPI  Past Medical History:  Diagnosis Date   Arthritis    Asthma    Diabetes mellitus    Diabetes mellitus without complication ([place])    Hypercholesteremia    Hyperlipemia    Hypertension     Patient Active Problem List   Diagnosis Date Noted   Sepsis ([place]) 11/15/2017   AKI (acute kidney injury) ([place]) 11/15/2017   Acute lower UTI 11/15/2017   Uncontrolled type 2 diabetes mellitus with hyperglycemia ([place]) 07/06/2017   Essential hypertension, benign 07/06/2017   Mixed hyperlipidemia 07/06/2017   Class 2 severe obesity due to excess calories with serious comorbidity and body mass index (BMI) of 36.0 to 36.9 in adult ([place]) 07/06/2017   Medial meniscus, posterior horn derangement 04/12/2012     OA (osteoarthritis) of knee 04/12/2012    Past Surgical History:  Procedure Laterality Date   APPENDECTOMY     CYST REMOVAL HAND     Right, hospital in [place] WITH MEDIAL MENISECTOMY Right 02/28/2012   Procedure: KNEE ARTHROSCOPY WITH MEDIAL MENISECTOMY;  Surgeon: Carole Civil, MD;  Location: [facility];  Service: Orthopedics;  Laterality: Right;   REPAIR KNEE LIGAMENT       OB History    Gravida  3   Para  3   Term  3   Preterm      AB      Living        SAB      TAB      Ectopic      Multiple      Live Births               Home Medications    Prior to Admission medications   Medication Sig Start Date End Date Taking? Authorizing Provider  albuterol (PROVENTIL) (2.5 MG/3ML) 0.083% nebulizer solution Take 2.5 mg by nebulization every 6 (six) hours as needed for wheezing or shortness of breath.  10/20/17  Yes [provider]  atorvastatin (LIPITOR) 20 MG tablet Take 20 mg by mouth every evening.    Yes [provider]  doxycycline (VIBRAMYCIN) 100 MG capsule Take 100 mg by mouth 2 (two) times daily. 10 day course starting on 11/23/2018 11/23/18  Yes [provider]  fluticasone (FLONASE) 50 MCG/ACT nasal spray Place 2 sprays into both nostrils daily.  11/23/18  Yes [provider]  LEVEMIR FLEXTOUCH 100 UNIT/ML Pen Inject 62 Units into the skin at bedtime.  09/15/13  Yes [provider]  lisinopril (PRINIVIL,ZESTRIL) 20 MG tablet Take 40 mg by mouth daily.   Yes [provider]  metFORMIN (GLUCOPHAGE) 500 MG tablet Take 500 mg by mouth 2 (two) times daily with a meal.   Yes [provider]  triamterene-hydrochlorothiazide (MAXZIDE-25) 37.5-25 MG per tablet Take 1 tablet by mouth daily.   Yes [provider]  ciprofloxacin (CIPRO) 500 MG tablet Take 1 tablet (500 mg total) by mouth every 12 (twelve) hours for 7 days. 11/26/18 12/03/18  Khylei Wilms, PA-C  metroNIDAZOLE  (FLAGYL) 500 MG tablet Take 1 tablet (500 mg total) by mouth 3 (three) times daily for 7 days. 11/26/18 12/03/18  Delia Heady, PA-C    Family History Family History  Problem Relation Age of Onset   Lung disease Other    Cancer Other    Arthritis Other    Asthma Other    Diabetes Other    Diabetes Mother    Hypertension Mother    Diabetes Father    Hypertension Sister    Diabetes Brother     Social History Social History   Tobacco Use   Smoking status: Never Smoker   Smokeless tobacco: Never Used  Substance Use Topics   Alcohol use: No   Drug use: No     Allergies   Amoxicillin, Penicillins, and Sulfa antibiotics   Review of Systems Review of Systems  Constitutional: Negative for appetite change, chills and fever.  HENT: Negative for ear pain, rhinorrhea, sneezing and sore throat.   Eyes: Negative for photophobia and visual disturbance.  Respiratory: Negative for cough, chest tightness, shortness of breath and wheezing.   Cardiovascular: Negative for chest pain and palpitations.  Gastrointestinal: Positive for abdominal pain and nausea. Negative for blood in stool, constipation, diarrhea and vomiting.  Genitourinary: Positive for frequency. Negative for dysuria, hematuria and urgency.  Musculoskeletal: Negative for myalgias.  Skin: Negative for rash.  Neurological: Negative for dizziness, weakness and light-headedness.     Physical Exam Updated Vital Signs BP (!) 123/58    Pulse 72    Temp 98.8 F (37.1 C) (Oral)    Resp 18    Wt 90.7 kg    SpO2 95%    BMI 33.28 kg/m   Physical Exam Vitals signs and nursing note reviewed.  Constitutional:      General: She is not in acute distress.    Appearance: She is well-developed.  HENT:     Head: Normocephalic and atraumatic.     Nose: Nose normal.  Eyes:     General: No scleral icterus.       Left eye: No discharge.     Conjunctiva/sclera: Conjunctivae normal.  Neck:     Musculoskeletal: Normal  range of motion and neck supple.  Cardiovascular:     Rate and Rhythm: Normal rate and regular rhythm.     Heart sounds: Normal heart sounds. No murmur. No friction rub. No gallop.   Pulmonary:     Effort: Pulmonary effort is normal. No respiratory distress.     Breath sounds: Normal breath sounds.  Abdominal:     General: Bowel sounds are normal. There is no distension.     Palpations: Abdomen is soft.     Tenderness: There is abdominal tenderness in the  right lower quadrant. There is no guarding.  Musculoskeletal: Normal range of motion.  Skin:    General: Skin is warm and dry.     Findings: No rash.  Neurological:     Mental Status: She is alert.     Motor: No abnormal muscle tone.     Coordination: Coordination normal.      ED Treatments / Results  Labs (all labs ordered are listed, but only abnormal results are displayed) Labs Reviewed  COMPREHENSIVE METABOLIC PANEL - Abnormal; Notable for the following components:      Result Value   Sodium 132 (*)    Potassium 5.4 (*)    Glucose, Bld 102 (*)    Creatinine, Ser 1.14 (*)    Calcium 10.9 (*)    Alkaline Phosphatase 129 (*)    GFR calc non Af Amer 45 (*)    GFR calc Af Amer 53 (*)    All other components within normal limits  CBC - Abnormal; Notable for the following components:   WBC 14.1 (*)    Hemoglobin 8.1 (*)    HCT 30.3 (*)    MCV 65.4 (*)    MCH 17.5 (*)    MCHC 26.7 (*)    RDW 20.6 (*)    Platelets 427 (*)    nRBC 0.3 (*)    All other components within normal limits  URINALYSIS, ROUTINE W REFLEX MICROSCOPIC - Abnormal; Notable for the following components:   APPearance CLOUDY (*)    Leukocytes,Ua MODERATE (*)    Bacteria, UA RARE (*)    All other components within normal limits  URINE CULTURE  LIPASE, BLOOD    EKG None  Radiology Ct Abdomen Pelvis W Contrast  Result Date: 11/26/2018 CLINICAL DATA:  Abdominal pain, question of appendicitis EXAM: CT ABDOMEN AND PELVIS WITH CONTRAST TECHNIQUE:  Multidetector CT imaging of the abdomen and pelvis was performed using the standard protocol following bolus administration of intravenous contrast. CONTRAST:  139mL OMNIPAQUE IOHEXOL 300 MG/ML  SOLN COMPARISON:  August 17, 2018 FINDINGS: Lower chest: There is moderate cardiomegaly as on the prior exam. A tiny hiatal hernia seen. The visualized portions of the lungs are clear. Hepatobiliary: Small low-density lesions are seen throughout the liver parenchyma which are unchanged from the prior exam the largest in the anterior left liver lobe measuring 2.4 cm, these are likely hepatic cysts.The main portal vein is patent. No evidence of calcified gallstones, gallbladder wall thickening or biliary dilatation. Pancreas: Unremarkable. No pancreatic ductal dilatation or surrounding inflammatory changes. Spleen: Normal in size without focal abnormality. Adrenals/Urinary Tract: Both adrenal glands appear normal. Bilateral low-density lesions are seen within both kidneys, unchanged from prior exam the largest measuring 2.3 cm in the upper pole of the left kidney, likely simple renal cysts. No hydronephrosis is seen. The bladder is partially decompressed. Stomach/Bowel: The stomach and small bowel are normal in appearance.Scattered colonic diverticula are noted with question of mild fat stranding changes seen around a focal segment of the sigmoid colon and best seen on series 2, image 54. Surrounding free air or loculated fluid collections are seen. The patient is status post appendectomy. Vascular/Lymphatic: There are no enlarged mesenteric, retroperitoneal, or pelvic lymph nodes. Scattered aortic atherosclerotic calcifications are seen without aneurysmal dilatation. Reproductive: The uterus and adnexa are unremarkable. Other: No evidence of abdominal wall mass or hernia. Musculoskeletal: No acute or significant osseous findings. Patient has had prior decompression and posterior fixation from L2 through S1. IMPRESSION: 1.  Findings suggestive of  mild sigmoid colonic diverticulitis. No pericolonic free air or fluid collections. 2. Patient is status post appendectomy. 3. Stable hepatic and renal cysts. 4.  Aortic Atherosclerosis (ICD10-I70.0). Electronically Signed   By: Prudencio Pair M.D.   On: 11/26/2018 21:00    Procedures Procedures (including critical care time)  Medications Ordered in ED Medications  ciprofloxacin (CIPRO) tablet 500 mg (has no administration in time range)  metroNIDAZOLE (FLAGYL) tablet 500 mg (has no administration in time range)  iohexol (OMNIPAQUE) 300 MG/ML solution 100 mL (100 mLs Intravenous Contrast Given 11/26/18 2048)     Initial Impression / Assessment and Plan / ED Course  I have reviewed the triage vital signs and the nursing notes.  Pertinent labs & imaging results that were available during my care of the patient were reviewed by me and considered in my medical decision making (see chart for details).        83 year old female with a past medical history of diverticulosis presents to the ED for right-sided flank pain radiating to her right lower quadrant.  Symptoms have been intermittent since July 2020 but her nausea and urinary frequency started yesterday.  She is unsure what has caused her abdominal pain in the past.  CT scan showed diverticulosis without diverticulitis back in July.  On exam patient is overall well-appearing.  Tenderness palpation of the right side of the abdomen without rebound or guarding.  Hemodynamically stable.  Lab work significant for leukocytosis of 14, urinalysis showing moderate leukocytes, pyuria and bacteria.  CT of the abdomen pelvis shows findings consistent with uncomplicated sigmoid diverticulitis.  Will treat with Cipro and Flagyl for UTI and diverticulitis.  Urine sent for culture.  Patient advised to follow-up with PCP and return for worsening symptoms.  Patient is hemodynamically stable, in NAD, and able to ambulate in the ED. Evaluation  does not show pathology that would require ongoing emergent intervention or inpatient treatment. I explained the diagnosis to the patient. Pain has been managed and has no complaints prior to discharge. Patient is comfortable with above plan and is stable for discharge at this time. All questions were answered prior to disposition. Strict return precautions for returning to the ED were discussed. Encouraged follow up with PCP.   An After Visit Summary was printed and given to the patient.   Portions of this note were generated with [employer]. Dictation errors may occur despite best attempts at proofreading.   Final Clinical Impressions(s) / ED Diagnoses   Final diagnoses:  Diverticulitis  Lower urinary tract infectious disease    ED Discharge Orders         Ordered    ciprofloxacin (CIPRO) 500 MG tablet  Every 12 hours     11/26/18 2107    metroNIDAZOLE (FLAGYL) 500 MG tablet  3 times daily     11/26/18 2107           Delia Heady, PA-C 11/26/18 2112    Fredia Sorrow, MD 11/29/18 1912

## 2018-11-28 LAB — URINE CULTURE: Culture: 20000 — AB

## 2018-11-29 ENCOUNTER — Telehealth: Payer: Self-pay | Admitting: Emergency Medicine

## 2018-11-29 NOTE — Telephone Encounter (Signed)
Post ED Visit - Positive Culture Follow-up  Culture report reviewed by antimicrobial stewardship pharmacist: [place] Team []  Elenor Quinones, Pharm.D. []  Heide Guile, Pharm.D., BCPS AQ-ID []  Parks Neptune, Pharm.D., BCPS []  Alycia Rossetti, Pharm.D., BCPS []  [place], Pharm.D., BCPS, AAHIVP []  Legrand Como, Pharm.D., BCPS, AAHIVP []  Salome Arnt, PharmD, BCPS []  Johnnette Gourd, PharmD, BCPS []  Hughes Better, PharmD, BCPS []  Leeroy Cha, PharmD []  Laqueta Linden, PharmD, BCPS []  Albertina Parr, PharmD  [place] Team []  Leodis Sias, PharmD []  Lindell Spar, PharmD []  Royetta Asal, PharmD []  Graylin Shiver, Rph []  Rema Fendt) Glennon Mac, PharmD []  Arlyn Dunning, PharmD []  Netta Cedars, PharmD []  Dia Sitter, PharmD []  Leone Haven, PharmD []  Gretta Arab, PharmD []  Theodis Shove, PharmD []  Peggyann Juba, PharmD []  Reuel Boom, PharmD   Positive urine culture Treated with doxycycline, organism sensitive to the same and no further patient follow-up is required at this time.  Hazle Nordmann 11/29/2018, 12:32 PM

## 2019-02-09 ENCOUNTER — Encounter (HOSPITAL_COMMUNITY): Payer: Self-pay

## 2019-02-09 ENCOUNTER — Emergency Department (HOSPITAL_COMMUNITY): Payer: Medicare HMO

## 2019-02-09 ENCOUNTER — Observation Stay (HOSPITAL_COMMUNITY)
Admission: EM | Admit: 2019-02-09 | Discharge: 2019-02-10 | Disposition: A | Discharge status: Home / Self Care | Payer: Medicare HMO | Attending: Family Medicine | Admitting: Family Medicine

## 2019-02-09 ENCOUNTER — Other Ambulatory Visit: Payer: Self-pay

## 2019-02-09 DIAGNOSIS — E1122 Type 2 diabetes mellitus with diabetic chronic kidney disease: Secondary | ICD-10-CM | POA: Diagnosis present

## 2019-02-09 DIAGNOSIS — I10 Essential (primary) hypertension: Secondary | ICD-10-CM | POA: Diagnosis present

## 2019-02-09 DIAGNOSIS — Z20822 Contact with and (suspected) exposure to covid-19: Secondary | ICD-10-CM | POA: Insufficient documentation

## 2019-02-09 DIAGNOSIS — E785 Hyperlipidemia, unspecified: Secondary | ICD-10-CM | POA: Insufficient documentation

## 2019-02-09 DIAGNOSIS — E538 Deficiency of other specified B group vitamins: Secondary | ICD-10-CM | POA: Diagnosis present

## 2019-02-09 DIAGNOSIS — D649 Anemia, unspecified: Principal | ICD-10-CM | POA: Diagnosis present

## 2019-02-09 DIAGNOSIS — E782 Mixed hyperlipidemia: Secondary | ICD-10-CM | POA: Diagnosis present

## 2019-02-09 DIAGNOSIS — E1165 Type 2 diabetes mellitus with hyperglycemia: Secondary | ICD-10-CM | POA: Diagnosis present

## 2019-02-09 DIAGNOSIS — N390 Urinary tract infection, site not specified: Secondary | ICD-10-CM | POA: Diagnosis present

## 2019-02-09 DIAGNOSIS — E1169 Type 2 diabetes mellitus with other specified complication: Secondary | ICD-10-CM | POA: Diagnosis present

## 2019-02-09 DIAGNOSIS — M791 Myalgia, unspecified site: Secondary | ICD-10-CM | POA: Diagnosis present

## 2019-02-09 LAB — CBC WITH DIFFERENTIAL/PLATELET
Abs Immature Granulocytes: 0.11 10*3/uL — ABNORMAL HIGH (ref 0.00–0.07)
Basophils Absolute: 0.1 10*3/uL (ref 0.0–0.1)
Basophils Relative: 0 %
Eosinophils Absolute: 0.2 10*3/uL (ref 0.0–0.5)
Eosinophils Relative: 2 %
HCT: 24 % — ABNORMAL LOW (ref 36.0–46.0)
Hemoglobin: 6.3 g/dL — CL (ref 12.0–15.0)
Immature Granulocytes: 1 %
Lymphocytes Relative: 12 %
Lymphs Abs: 1.8 10*3/uL (ref 0.7–4.0)
MCH: 16.7 pg — ABNORMAL LOW (ref 26.0–34.0)
MCHC: 26.3 g/dL — ABNORMAL LOW (ref 30.0–36.0)
MCV: 63.7 fL — ABNORMAL LOW (ref 80.0–100.0)
Monocytes Absolute: 1.4 10*3/uL — ABNORMAL HIGH (ref 0.1–1.0)
Monocytes Relative: 9 %
Neutro Abs: 11.5 10*3/uL — ABNORMAL HIGH (ref 1.7–7.7)
Neutrophils Relative %: 76 %
Platelets: 390 10*3/uL (ref 150–400)
RBC: 3.77 MIL/uL — ABNORMAL LOW (ref 3.87–5.11)
RDW: 23 % — ABNORMAL HIGH (ref 11.5–15.5)
WBC: 15.1 10*3/uL — ABNORMAL HIGH (ref 4.0–10.5)
nRBC: 0.8 % — ABNORMAL HIGH (ref 0.0–0.2)

## 2019-02-09 LAB — URINALYSIS, ROUTINE W REFLEX MICROSCOPIC
Bilirubin Urine: NEGATIVE
Glucose, UA: NEGATIVE mg/dL
Hgb urine dipstick: NEGATIVE
Ketones, ur: NEGATIVE mg/dL
Nitrite: NEGATIVE
Protein, ur: NEGATIVE mg/dL
Specific Gravity, Urine: 1.016 (ref 1.005–1.030)
pH: 5 (ref 5.0–8.0)

## 2019-02-09 LAB — TROPONIN I (HIGH SENSITIVITY)
Troponin I (High Sensitivity): 211 ng/L (ref <18)
Troponin I (High Sensitivity): 224 ng/L (ref <18)

## 2019-02-09 LAB — IRON AND TIBC
Iron: 9 ug/dL — ABNORMAL LOW (ref 28–170)
Saturation Ratios: 2 % — ABNORMAL LOW (ref 10.4–31.8)
TIBC: 505 ug/dL — ABNORMAL HIGH (ref 250–450)
UIBC: 496 ug/dL

## 2019-02-09 LAB — BASIC METABOLIC PANEL
Anion gap: 11 (ref 5–15)
BUN: 16 mg/dL (ref 8–23)
CO2: 21 mmol/L — ABNORMAL LOW (ref 22–32)
Calcium: 9.5 mg/dL (ref 8.9–10.3)
Chloride: 104 mmol/L (ref 98–111)
Creatinine, Ser: 1.26 mg/dL — ABNORMAL HIGH (ref 0.44–1.00)
GFR calc Af Amer: 46 mL/min — ABNORMAL LOW (ref >60)
GFR calc non Af Amer: 40 mL/min — ABNORMAL LOW (ref >60)
Glucose, Bld: 196 mg/dL — ABNORMAL HIGH (ref 70–99)
Potassium: 4.8 mmol/L (ref 3.5–5.1)
Sodium: 136 mmol/L (ref 135–145)

## 2019-02-09 LAB — FERRITIN: Ferritin: 2 ng/mL — ABNORMAL LOW (ref 11–307)

## 2019-02-09 LAB — PREPARE RBC (CROSSMATCH)

## 2019-02-09 LAB — RETICULOCYTES
Immature Retic Fract: 33.9 % — ABNORMAL HIGH (ref 2.3–15.9)
RBC.: 3.75 MIL/uL — ABNORMAL LOW (ref 3.87–5.11)
Retic Count, Absolute: 90.4 10*3/uL (ref 19.0–186.0)
Retic Ct Pct: 2.4 % (ref 0.4–3.1)

## 2019-02-09 LAB — ABO/RH: ABO/RH(D): O POS

## 2019-02-09 LAB — RESPIRATORY PANEL BY RT PCR (FLU A&B, COVID)
Influenza A by PCR: NEGATIVE
Influenza B by PCR: NEGATIVE
SARS Coronavirus 2 by RT PCR: NEGATIVE

## 2019-02-09 LAB — VITAMIN B12: Vitamin B-12: 103 pg/mL — ABNORMAL LOW (ref 180–914)

## 2019-02-09 LAB — FOLATE: Folate: 16.9 ng/mL (ref >5.9)

## 2019-02-09 LAB — POC SARS CORONAVIRUS 2 AG -  ED: SARS Coronavirus 2 Ag: NEGATIVE

## 2019-02-09 LAB — GLUCOSE, CAPILLARY: Glucose-Capillary: 148 mg/dL — ABNORMAL HIGH (ref 70–99)

## 2019-02-09 MED ORDER — ACETAMINOPHEN 325 MG PO TABS: 650.0000 mg | ORAL_TABLET | Freq: Four times a day (QID) | ORAL | Status: DC | PRN

## 2019-02-09 MED ORDER — CYANOCOBALAMIN 1000 MCG/ML IJ SOLN
1000.0000 ug | Freq: Once | INTRAMUSCULAR | Status: AC
  Administered 2019-02-09: 1000 ug via INTRAMUSCULAR
  Filled 2019-02-09: qty 1

## 2019-02-09 MED ORDER — METFORMIN HCL 500 MG PO TABS
500.0000 mg | ORAL_TABLET | Freq: Two times a day (BID) | ORAL | Status: DC
  Administered 2019-02-09: 500 mg via ORAL
  Filled 2019-02-09: qty 1

## 2019-02-09 MED ORDER — ACETAMINOPHEN 650 MG RE SUPP: 650.0000 mg | Freq: Four times a day (QID) | RECTAL | Status: DC | PRN

## 2019-02-09 MED ORDER — SODIUM CHLORIDE 0.9 % IV SOLN: 10.0000 mL/h | Freq: Once | INTRAVENOUS | Status: DC

## 2019-02-09 MED ORDER — ACETAMINOPHEN 500 MG PO TABS
1000.0000 mg | ORAL_TABLET | Freq: Once | ORAL | Status: AC
  Administered 2019-02-09: 1000 mg via ORAL
  Filled 2019-02-09: qty 2

## 2019-02-09 MED ORDER — TRIAMTERENE-HCTZ 37.5-25 MG PO TABS
1.0000 | ORAL_TABLET | Freq: Every day | ORAL | Status: DC
  Administered 2019-02-10: 1 via ORAL
  Filled 2019-02-09: qty 1

## 2019-02-09 MED ORDER — ATORVASTATIN CALCIUM 20 MG PO TABS: 20.0000 mg | ORAL_TABLET | Freq: Every evening | ORAL | Status: DC

## 2019-02-09 MED ORDER — INSULIN DETEMIR 100 UNIT/ML FLEXPEN: 62.0000 [IU] | PEN_INJECTOR | Freq: Every day | SUBCUTANEOUS | Status: DC

## 2019-02-09 MED ORDER — FLUTICASONE PROPIONATE 50 MCG/ACT NA SUSP: 2.0000 | Freq: Every day | NASAL | Status: DC

## 2019-02-09 MED ORDER — ALBUTEROL SULFATE (2.5 MG/3ML) 0.083% IN NEBU: 2.5000 mg | INHALATION_SOLUTION | Freq: Four times a day (QID) | RESPIRATORY_TRACT | Status: DC | PRN

## 2019-02-09 MED ORDER — LISINOPRIL 10 MG PO TABS: 40.0000 mg | ORAL_TABLET | Freq: Every day | ORAL | Status: DC

## 2019-02-09 NOTE — ED Provider Notes (Signed)
[facility] EMERGENCY DEPARTMENT Provider Note   CSN: [id number] Arrival date & time: 02/09/19  1546     History Chief Complaint  Patient presents with  . Generalized Body Aches    Stephanie [name] is a 84 y.o. female.  HPI      Stephanie [name] is a 84 y.o. female with past medical history of asthma, diabetes and hypertension who presents to the Emergency Department complaining of she complains of generalized body aches, chills, frontal headache and cough.  Symptoms have been present for 2 days.  She describes her cough is mostly nonproductive and headache has been waxing and waning in severity.  She describes having fatigue and a continuous "aching all over."  She denies shortness of breath.  She has an albuterol nebulizer machine at home and uses on an as needed basis.  No known fever or chills at home.  She denies abdominal pain, diarrhea, vomiting, dysuria and loss of taste or smell.  No bowel changes or decreased appetite. No known sick contacts or household members who are currently sick.   Past Medical History:  Diagnosis Date  . Arthritis   . Asthma   . Diabetes mellitus   . Diabetes mellitus without complication ([place])   . Hypercholesteremia   . Hyperlipemia   . Hypertension     Patient Active Problem List   Diagnosis Date Noted  . Sepsis ([place]) 11/15/2017  . AKI (acute kidney injury) ([place]) 11/15/2017  . Acute lower UTI 11/15/2017  . Uncontrolled type 2 diabetes mellitus with hyperglycemia ([place]) 07/06/2017  . Essential hypertension, benign 07/06/2017  . Mixed hyperlipidemia 07/06/2017  . Class 2 severe obesity due to excess calories with serious comorbidity and body mass index (BMI) of 36.0 to 36.9 in adult ([place]) 07/06/2017  . Medial meniscus, posterior horn derangement 04/12/2012  . OA (osteoarthritis) of knee 04/12/2012    Past Surgical History:  Procedure Laterality Date  . APPENDECTOMY    . CYST REMOVAL HAND     Right, hospital in [place]  ARTHROSCOPY WITH MEDIAL MENISECTOMY Right 02/28/2012   Procedure: KNEE ARTHROSCOPY WITH MEDIAL MENISECTOMY;  Surgeon: Carole Civil, MD;  Location: [facility];  Service: Orthopedics;  Laterality: Right;  . REPAIR KNEE LIGAMENT       OB History    Gravida  3   Para  3   Term  3   Preterm      AB      Living        SAB      TAB      Ectopic      Multiple      Live Births              Family History  Problem Relation Age of Onset  . Lung disease Other   . Cancer Other   . Arthritis Other   . Asthma Other   . Diabetes Other   . Diabetes Mother   . Hypertension Mother   . Diabetes Father   . Hypertension Sister   . Diabetes Brother     Social History   Tobacco Use  . Smoking status: Never Smoker  . Smokeless tobacco: Never Used  Substance Use Topics  . Alcohol use: No  . Drug use: No    Home Medications Prior to Admission medications   Medication Sig Start Date End Date Taking? Authorizing Provider  albuterol (PROVENTIL) (2.5 MG/3ML) 0.083% nebulizer solution Take 2.5 mg by nebulization every  6 (six) hours as needed for wheezing or shortness of breath.  10/20/17   [provider]  atorvastatin (LIPITOR) 20 MG tablet Take 20 mg by mouth every evening.     [provider]  doxycycline (VIBRAMYCIN) 100 MG capsule Take 100 mg by mouth 2 (two) times daily. 10 day course starting on 11/23/2018 11/23/18   [provider]  fluticasone (FLONASE) 50 MCG/ACT nasal spray Place 2 sprays into both nostrils daily.  11/23/18   [provider]  LEVEMIR FLEXTOUCH 100 UNIT/ML Pen Inject 62 Units into the skin at bedtime.  09/15/13   [provider]  lisinopril (PRINIVIL,ZESTRIL) 20 MG tablet Take 40 mg by mouth daily.    [provider]  metFORMIN (GLUCOPHAGE) 500 MG tablet Take 500 mg by mouth 2 (two) times daily with a meal.    [provider]  triamterene-hydrochlorothiazide (MAXZIDE-25) 37.5-25 MG per tablet  Take 1 tablet by mouth daily.    [provider]    Allergies    Amoxicillin, Penicillins, and Sulfa antibiotics  Review of Systems   Review of Systems  Constitutional: Positive for chills. Negative for fatigue and fever.  HENT: Negative for congestion, sore throat and trouble swallowing.   Respiratory: Positive for cough. Negative for shortness of breath and wheezing.   Cardiovascular: Positive for chest pain. Negative for palpitations.  Gastrointestinal: Negative for abdominal pain, blood in stool, nausea and vomiting.  Genitourinary: Negative for dysuria, flank pain and hematuria.  Musculoskeletal: Positive for myalgias. Negative for arthralgias, back pain, neck pain and neck stiffness.  Skin: Negative for rash.  Neurological: Positive for headaches. Negative for dizziness, weakness and numbness.  Hematological: Does not bruise/bleed easily.    Physical Exam Updated Vital Signs BP (!) 149/112 (BP Location: Right Arm)   Pulse 65   Temp 98.8 F (37.1 C) (Oral)   Resp 20   Ht 5\' 2"  (1.575 m)   Wt 56.7 kg   SpO2 95%   BMI 22.86 kg/m   Physical Exam Vitals and nursing note reviewed.  Constitutional:      General: She is not in acute distress.    Appearance: Normal appearance. She is not ill-appearing.  HENT:     Right Ear: Tympanic membrane and ear canal normal.     Left Ear: Tympanic membrane and ear canal normal.     Mouth/Throat:     Mouth: Mucous membranes are moist.  Eyes:     Conjunctiva/sclera: Conjunctivae normal.     Pupils: Pupils are equal, round, and reactive to light.  Neck:     Meningeal: Kernig's sign absent.  Cardiovascular:     Rate and Rhythm: Normal rate and regular rhythm.     Pulses: Normal pulses.  Pulmonary:     Effort: Pulmonary effort is normal.     Breath sounds: No wheezing, rhonchi or rales.  Chest:     Chest wall: No tenderness.  Abdominal:     General: There is no distension.     Palpations: Abdomen is soft.      Tenderness: There is no abdominal tenderness. There is no right CVA tenderness, left CVA tenderness or guarding.  Genitourinary:    Rectum: Guaiac result negative. No mass, tenderness, anal fissure or external hemorrhoid.     Comments: Brown, heme-negative stool on rectal exam.  No palpable masses.  No external hemorrhoids seen.  Chaperone present Musculoskeletal:        General: Normal range of motion.     Cervical  back: Normal range of motion. No rigidity.     Right lower leg: No edema.     Left lower leg: No edema.  Skin:    General: Skin is warm.     Capillary Refill: Capillary refill takes less than 2 seconds.     Findings: No erythema or rash.  Neurological:     General: No focal deficit present.     Mental Status: She is alert.     Sensory: No sensory deficit.     Motor: No weakness.     ED Results / Procedures / Treatments   Labs (all labs ordered are listed, but only abnormal results are displayed) Labs Reviewed  BASIC METABOLIC PANEL - Abnormal; Notable for the following components:      Result Value   CO2 21 (*)    Glucose, Bld 196 (*)    Creatinine, Ser 1.26 (*)    GFR calc non Af Amer 40 (*)    GFR calc Af Amer 46 (*)    All other components within normal limits  CBC WITH DIFFERENTIAL/PLATELET - Abnormal; Notable for the following components:   WBC 15.1 (*)    RBC 3.77 (*)    Hemoglobin 6.3 (*)    HCT 24.0 (*)    MCV 63.7 (*)    MCH 16.7 (*)    MCHC 26.3 (*)    RDW 23.0 (*)    nRBC 0.8 (*)    Neutro Abs 11.5 (*)    Monocytes Absolute 1.4 (*)    Abs Immature Granulocytes 0.11 (*)    All other components within normal limits  URINALYSIS, ROUTINE W REFLEX MICROSCOPIC - Abnormal; Notable for the following components:   APPearance HAZY (*)    Leukocytes,Ua LARGE (*)    Bacteria, UA RARE (*)    All other components within normal limits  VITAMIN B12 - Abnormal; Notable for the following components:   Vitamin B-12 103 (*)    All other components within  normal limits  IRON AND TIBC - Abnormal; Notable for the following components:   Iron 9 (*)    TIBC 505 (*)    Saturation Ratios 2 (*)    All other components within normal limits  FERRITIN - Abnormal; Notable for the following components:   Ferritin 2 (*)    All other components within normal limits  RETICULOCYTES - Abnormal; Notable for the following components:   RBC. 3.75 (*)    Immature Retic Fract 33.9 (*)    All other components within normal limits  TROPONIN I (HIGH SENSITIVITY) - Abnormal; Notable for the following components:   Troponin I (High Sensitivity) 211 (*)    All other components within normal limits  TROPONIN I (HIGH SENSITIVITY) - Abnormal; Notable for the following components:   Troponin I (High Sensitivity) 224 (*)    All other components within normal limits  URINE CULTURE  RESPIRATORY PANEL BY RT PCR (FLU A&B, COVID)  FOLATE  POC SARS CORONAVIRUS 2 AG -  ED  POC OCCULT BLOOD, ED  PREPARE RBC (CROSSMATCH)  TYPE AND SCREEN  ABO/RH    EKG EKG Interpretation  Date/Time:  Friday February 09 2019 16:38:54 EST Ventricular Rate:  83 PR Interval:    QRS Duration: 86 QT Interval:  350 QTC Calculation: 412 R Axis:   -15 Text Interpretation: Sinus or ectopic atrial tachycardia Multiple premature complexes, vent & supraven Borderline left axis deviation Anterior infarct, old Borderline ST depression, diffuse leads Confirmed by Milton Ferguson [phone])  on 02/09/2019 5:35:33 PM   Radiology DG Chest Portable 1 View  Result Date: 02/09/2019 CLINICAL DATA:  Chest pain. EXAM: PORTABLE CHEST 1 VIEW COMPARISON:  None. FINDINGS: There is no evidence of acute infiltrate, pleural effusion or pneumothorax. The cardiac silhouette is moderately enlarged and unchanged in size. The visualized skeletal structures are unremarkable. IMPRESSION: 1. Stable cardiomegaly without evidence of acute or active cardiopulmonary disease. Electronically Signed   By: Virgina Norfolk M.D.   On:  02/09/2019 17:49    Procedures Procedures (including critical care time)  CRITICAL CARE Performed by: Romonia Yanik Total critical care time: 45  minutes Critical care time was exclusive of separately billable procedures and treating other patients. Critical care was necessary to treat or prevent imminent or life-threatening deterioration. Critical care was time spent personally by me on the following activities: development of treatment plan with patient and/or surrogate as well as nursing, discussions with consultants, evaluation of patient's response to treatment, examination of patient, obtaining history from patient or surrogate, ordering and performing treatments and interventions, ordering and review of laboratory studies, ordering and review of radiographic studies, pulse oximetry and re-evaluation of patient's condition.   Medications Ordered in ED Medications  0.9 %  sodium chloride infusion (has no administration in time range)    ED Course  I have reviewed the triage vital signs and the nursing notes.  Pertinent labs & imaging results that were available during my care of the patient were reviewed by me and considered in my medical decision making (see chart for details).    MDM Rules/Calculators/A&P                      Patient with generalized body aches for several days. She was seen by her PCPs office yesterday and COVID-19 test is pending. She denies fever, loss of taste or smell, abdominal pain or diarrhea.  Covid antigen test here is negative.  U/a shows possible UTI, urine was cultured   Laboratory studies reveal hemoglobin of 6.3 down from 8.1 in November 2020.  On further history, patient does report history of blood transfusion many years ago, but no reported history of anemia.  She has not had a recent colonoscopy.  Orders were placed for anemia panel and transfusion of 2 units of blood.   initial troponin elevated at 211, on further history, she reports  having an episode of right-sided chest pain 2 days ago and contacted EMS after taking Pepto and pain did not resolve.  Pain lasted approximately 3 hours and spontaneously resolved so she did not go to the emergency department.  Pain has not returned.   Pain was not associated with other symptoms.   [place] cardiology, Dr. Gardiner Rhyme and discussed findings.  He recommends continued treatment of patient's symptomatic anemia with repeat of troponin and EKG after patient receives blood and echocardiogram in the a.m. Felt that pt was appropriate to stay here from a cardiology perspective.    2025  Consulted hospitalist, Dr. Olevia Bowens who agrees to admit.    Final Clinical Impression(s) / ED Diagnoses Final diagnoses:  Symptomatic anemia    Rx / DC Orders ED Discharge Orders    None       Bufford Lope 02/09/19 2034    Milton Ferguson, MD 02/09/19 2256

## 2019-02-09 NOTE — ED Notes (Signed)
Date and time results received: 02/09/19 1730 (use smartphrase ".now" to insert current time)  Test: Troponin Critical Value: 211  Name of Provider Notified: Cyndi Bender, Pa-C  Orders Received? Or Actions Taken?: No new orders at present

## 2019-02-09 NOTE — ED Notes (Signed)
CRITICAL VALUE ALERT  Critical Value: Hemoglobin 6.3 Date & Time Notied: 02/09/19, 1725 Provider Notified: Kem Parkinson PAC Orders Received/Actions taken: no orders at present

## 2019-02-09 NOTE — H&P (Addendum)
History and Physical    JAELENE GARCIAGARCIA ZHG:[id number] DOB: [birth year] DOA: 02/09/2019  PCP: Abran Richard, MD   Patient coming from: Home.  I have personally briefly reviewed patient's old medical records in [place]  Chief Complaint: Chills, headache and body aches.  HPI: Stephanie [name] is a 84 y.o. female with medical history significant of osteoarthritis, asthma, type 2 diabetes, hyperlipidemia, hypertension who is coming to the emergency department due to chills, headache, generalized body aches and weakness for the past 2 days.  She denies fever, sore throat or rhinorrhea.  Complains of dyspnea on exertion, wheezing or hemoptysis.  Postural dizziness, but denies chest pain, palpitations, diaphoresis, PND, orthopnea or pitting edema of the lower extremities.  She denies abdominal pain, nausea, vomiting, diarrhea, constipation, melena or hematochezia.  She has been experiencing some urgency, but denies dysuria, flank pain or hematuria.  No polyuria, polydipsia, polyphagia or blurred vision.  ED Course: Initial vital signs temperature 98.8 F, pulse 65, respirations 20, blood pressure 149/112 mmHg and O2 sat 95% on room air.  At 2 PRBC unit transfusion was started in the emergency department.  Urinalysis show a hazy appearance with large leukocyte esterase, 6-10 RBC and 21-50 WBC with rare bacteria on microscopic examination.  CBC showed a white count of 15.1 with 76% neutrophils, hemoglobin 6.3 g/dL and platelets 390.  Troponin was 211 and then 224 ng/L.  BMP shows a CO2 of 21 mmol/L, glucose of 196 and creatinine of 1.26 mg/dL.  The rest of the electrolytes and BUN were normal.  SARS coronavirus antigen and PCR were negative.  Ferritin was 2 ng/L, iron was 9 and TIBC 505 mcg/dL with saturation ratio of 2%.  Her chest radiograph showed stable cardiomegaly.  Review of Systems: As per HPI otherwise 10 point review of systems negative.   Past Medical History:  Diagnosis Date  .  Arthritis   . Asthma   . Diabetes mellitus   . Diabetes mellitus without complication ([place])   . Hypercholesteremia   . Hyperlipemia   . Hypertension     Past Surgical History:  Procedure Laterality Date  . APPENDECTOMY    . CYST REMOVAL HAND     Right, hospital in [place] ARTHROSCOPY WITH MEDIAL MENISECTOMY Right 02/28/2012   Procedure: KNEE ARTHROSCOPY WITH MEDIAL MENISECTOMY;  Surgeon: Carole Civil, MD;  Location: [facility];  Service: Orthopedics;  Laterality: Right;  . REPAIR KNEE LIGAMENT       reports that she has never smoked. She has never used smokeless tobacco. She reports that she does not drink alcohol or use drugs.  Allergies  Allergen Reactions  . Amoxicillin Swelling    Tongue swelling   . Penicillins Swelling    Has patient had a PCN reaction causing immediate rash, facial/tongue/throat swelling, SOB or lightheadedness with hypotension: No Has patient had a PCN reaction causing severe rash involving mucus membranes or skin necrosis: No Has patient had a PCN reaction that required hospitalization: No Has patient had a PCN reaction occurring within the last 10 years: No If all of the above answers are "NO", then may proceed with Cephalosporin use.   . Sulfa Antibiotics Swelling    Lip swelling    Family History  Problem Relation Age of Onset  . Lung disease Other   . Cancer Other   . Arthritis Other   . Asthma Other   . Diabetes Other   . Diabetes Mother   . Hypertension Mother   .  Diabetes Father   . Hypertension Sister   . Diabetes Brother    Prior to Admission medications   Medication Sig Start Date End Date Taking? Authorizing Provider  albuterol (PROVENTIL) (2.5 MG/3ML) 0.083% nebulizer solution Take 2.5 mg by nebulization every 6 (six) hours as needed for wheezing or shortness of breath.  10/20/17   [provider]  atorvastatin (LIPITOR) 20 MG tablet Take 20 mg by mouth every evening.     [provider]  doxycycline  (VIBRAMYCIN) 100 MG capsule Take 100 mg by mouth 2 (two) times daily. 10 day course starting on 11/23/2018 11/23/18   [provider]  fluticasone (FLONASE) 50 MCG/ACT nasal spray Place 2 sprays into both nostrils daily.  11/23/18   [provider]  LEVEMIR FLEXTOUCH 100 UNIT/ML Pen Inject 62 Units into the skin at bedtime.  09/15/13   [provider]  lisinopril (PRINIVIL,ZESTRIL) 20 MG tablet Take 40 mg by mouth daily.    [provider]  metFORMIN (GLUCOPHAGE) 500 MG tablet Take 500 mg by mouth 2 (two) times daily with a meal.    [provider]  triamterene-hydrochlorothiazide (MAXZIDE-25) 37.5-25 MG per tablet Take 1 tablet by mouth daily.    [provider]    Physical Exam: Vitals:   02/09/19 2015 02/09/19 2030 02/09/19 2100 02/09/19 2130  BP:  (!) 153/79 105/88 (!) 148/89  Pulse:    85  Resp:   (!) 27 (!) 25  Temp:      TempSrc:      SpO2: 100% 98% 99% 98%  Weight:      Height:        Constitutional: NAD, calm, comfortable Eyes: PERRL, lids and conjunctivae are pale. ENMT: Mucous membranes are moist. Posterior pharynx clear of any exudate or lesions. Neck: normal, supple, no masses, no thyromegaly Respiratory: Mildly tachypneic in the low to mid 20s, otherwise clear to auscultation bilaterally, no wheezing, no crackles. Normal respiratory effort. No accessory muscle use.  Cardiovascular: Regular rate and rhythm, no murmurs / rubs / gallops. No extremity edema. 2+ pedal pulses. No carotid bruits.  Abdomen: Nondistended, obese.  Soft, no tenderness, no masses palpated. No hepatosplenomegaly. Bowel sounds positive.  Musculoskeletal: no clubbing / cyanosis. Good ROM, no contractures. Normal muscle tone.  Skin: no rashes, lesions, ulcers on limited dermatological exam. Neurologic: CN 2-12 grossly intact. Sensation intact, DTR normal. Strength 5/5 in all 4.  Psychiatric: Normal judgment and insight. Alert and oriented x 3. Normal  mood.   Labs on Admission: I have personally reviewed following labs and imaging studies  CBC: Recent Labs  Lab 02/09/19 1635  WBC 15.1*  NEUTROABS 11.5*  HGB 6.3*  HCT 24.0*  MCV 63.7*  PLT 161   Basic Metabolic Panel: Recent Labs  Lab 02/09/19 1635  NA 136  K 4.8  CL 104  CO2 21*  GLUCOSE 196*  BUN 16  CREATININE 1.26*  CALCIUM 9.5   GFR: Estimated Creatinine Clearance: 27.7 mL/min (A) (by C-G formula based on SCr of 1.26 mg/dL (H)). Liver Function Tests: No results for input(s): AST, ALT, ALKPHOS, BILITOT, PROT, ALBUMIN in the last 168 hours. No results for input(s): LIPASE, AMYLASE in the last 168 hours. No results for input(s): AMMONIA in the last 168 hours. Coagulation Profile: No results for input(s): INR, PROTIME in the last 168 hours. Cardiac Enzymes: No results for input(s): CKTOTAL, CKMB, CKMBINDEX, TROPONINI in the last 168 hours. BNP (last 3 results) No results for input(s): PROBNP in the  last 8760 hours. HbA1C: No results for input(s): HGBA1C in the last 72 hours. CBG: No results for input(s): GLUCAP in the last 168 hours. Lipid Profile: No results for input(s): CHOL, HDL, LDLCALC, TRIG, CHOLHDL, LDLDIRECT in the last 72 hours. Thyroid Function Tests: No results for input(s): TSH, T4TOTAL, FREET4, T3FREE, THYROIDAB in the last 72 hours. Anemia Panel: Recent Labs    02/09/19 1635  VITAMINB12 103*  FOLATE 16.9  FERRITIN 2*  TIBC 505*  IRON 9*  RETICCTPCT 2.4   Urine analysis:    Component Value Date/Time   COLORURINE YELLOW 02/09/2019 1624   APPEARANCEUR HAZY (A) 02/09/2019 1624   LABSPEC 1.016 02/09/2019 1624   PHURINE 5.0 02/09/2019 1624   GLUCOSEU NEGATIVE 02/09/2019 1624   HGBUR NEGATIVE 02/09/2019 1624   BILIRUBINUR NEGATIVE 02/09/2019 1624   KETONESUR NEGATIVE 02/09/2019 1624   PROTEINUR NEGATIVE 02/09/2019 1624   UROBILINOGEN 0.2 04/25/2014 0936   NITRITE NEGATIVE 02/09/2019 1624   LEUKOCYTESUR LARGE (A) 02/09/2019 1624     Radiological Exams on Admission: DG Chest Portable 1 View  Result Date: 02/09/2019 CLINICAL DATA:  Chest pain. EXAM: PORTABLE CHEST 1 VIEW COMPARISON:  None. FINDINGS: There is no evidence of acute infiltrate, pleural effusion or pneumothorax. The cardiac silhouette is moderately enlarged and unchanged in size. The visualized skeletal structures are unremarkable. IMPRESSION: 1. Stable cardiomegaly without evidence of acute or active cardiopulmonary disease. Electronically Signed   By: Virgina Norfolk M.D.   On: 02/09/2019 17:49    EKG: Independently reviewed.  Vent. rate 83 BPM PR interval * ms QRS duration 86 ms QT/QTc 350/412 ms P-R-T axes -58 -15 62 Sinus or ectopic atrial tachycardia Multiple premature complexes, vent & supraven Borderline left axis deviation Anterior infarct, old Borderline ST depression, diffuse leads  Assessment/Plan Principal Problem:   Symptomatic anemia Observation/telemetry. Transfuse 2 units of PRBC. Follow-up H&H in a.m. Check stool fecal occult blood Begin iron supplementation after FOBT checked. Start vitamin B-12 supplementation. Follow-up with PCP after discharge.  Active Problems:   Vitamin B12 deficiency Cyanocobalamin 1000 mcg IM x1. Follow-up with PCP for weekly, then monthly injections.    Acute lower UTI Started on levofloxacin. Follow-up urine culture and sensitivity    Uncontrolled type 2 diabetes mellitus with hyperglycemia (HCC) Carbohydrate modified diet. Check hemoglobin A1c. Continue Levemir at a lower dose while in the hospital. Continue metformin 500 mg p.o. twice daily. CBG monitoring before meals and bedtime.    Essential hypertension, benign Continue lisinopril 40 mg p.o. daily. Continue Maxide 37.5-25 mg p.o. daily. Monitor BP, renal function electrolytes.    Mixed hyperlipidemia Continue atorvastatin 20 mg p.o. every evening.    DVT prophylaxis: SCDs. Code Status: Full code. Family  Communication: Disposition Plan: Observation for blood transfusion. Consults called: Admission status: Observation/telemetry.   Reubin Milan MD [facility]  If 7PM-7AM, please contact night-coverage [email or web address]  02/09/2019, 9:34 PM   This document was prepared using Dragon voice recognition software and may contain some unintended transcription errors.

## 2019-02-09 NOTE — ED Triage Notes (Addendum)
Pt presents to ED with complaints of chills, headache, generalized body aches x 2 days. Pt denies fever. Pt was seen at [facility] yesterday and has pending Covid test

## 2019-02-10 ENCOUNTER — Observation Stay (HOSPITAL_BASED_OUTPATIENT_CLINIC_OR_DEPARTMENT_OTHER): Payer: Medicare HMO

## 2019-02-10 DIAGNOSIS — I361 Nonrheumatic tricuspid (valve) insufficiency: Secondary | ICD-10-CM

## 2019-02-10 DIAGNOSIS — N390 Urinary tract infection, site not specified: Secondary | ICD-10-CM

## 2019-02-10 DIAGNOSIS — D649 Anemia, unspecified: Secondary | ICD-10-CM | POA: Diagnosis not present

## 2019-02-10 DIAGNOSIS — E1165 Type 2 diabetes mellitus with hyperglycemia: Secondary | ICD-10-CM

## 2019-02-10 DIAGNOSIS — I10 Essential (primary) hypertension: Secondary | ICD-10-CM

## 2019-02-10 DIAGNOSIS — E782 Mixed hyperlipidemia: Secondary | ICD-10-CM

## 2019-02-10 DIAGNOSIS — I340 Nonrheumatic mitral (valve) insufficiency: Secondary | ICD-10-CM

## 2019-02-10 DIAGNOSIS — E538 Deficiency of other specified B group vitamins: Secondary | ICD-10-CM

## 2019-02-10 LAB — TYPE AND SCREEN
ABO/RH(D): O POS
Antibody Screen: NEGATIVE
Unit division: 0
Unit division: 0

## 2019-02-10 LAB — BPAM RBC
Blood Product Expiration Date: 202102132359
Blood Product Expiration Date: 202102132359
ISSUE DATE / TIME: 202101221922
ISSUE DATE / TIME: 202101222323
Unit Type and Rh: 5100
Unit Type and Rh: 5100

## 2019-02-10 LAB — CBC
HCT: 30.5 % — ABNORMAL LOW (ref 36.0–46.0)
Hemoglobin: 8.6 g/dL — ABNORMAL LOW (ref 12.0–15.0)
MCH: 19.5 pg — ABNORMAL LOW (ref 26.0–34.0)
MCHC: 28.2 g/dL — ABNORMAL LOW (ref 30.0–36.0)
MCV: 69.2 fL — ABNORMAL LOW (ref 80.0–100.0)
Platelets: 336 10*3/uL (ref 150–400)
RBC: 4.41 MIL/uL (ref 3.87–5.11)
RDW: 26.4 % — ABNORMAL HIGH (ref 11.5–15.5)
WBC: 13.2 10*3/uL — ABNORMAL HIGH (ref 4.0–10.5)
nRBC: 0.6 % — ABNORMAL HIGH (ref 0.0–0.2)

## 2019-02-10 LAB — COMPREHENSIVE METABOLIC PANEL
ALT: 10 U/L (ref 0–44)
AST: 13 U/L — ABNORMAL LOW (ref 15–41)
Albumin: 3.6 g/dL (ref 3.5–5.0)
Alkaline Phosphatase: 105 U/L (ref 38–126)
Anion gap: 9 (ref 5–15)
BUN: 15 mg/dL (ref 8–23)
CO2: 25 mmol/L (ref 22–32)
Calcium: 9.6 mg/dL (ref 8.9–10.3)
Chloride: 101 mmol/L (ref 98–111)
Creatinine, Ser: 1.2 mg/dL — ABNORMAL HIGH (ref 0.44–1.00)
GFR calc Af Amer: 49 mL/min — ABNORMAL LOW (ref >60)
GFR calc non Af Amer: 42 mL/min — ABNORMAL LOW (ref >60)
Glucose, Bld: 123 mg/dL — ABNORMAL HIGH (ref 70–99)
Potassium: 4.8 mmol/L (ref 3.5–5.1)
Sodium: 135 mmol/L (ref 135–145)
Total Bilirubin: 1.1 mg/dL (ref 0.3–1.2)
Total Protein: 6.9 g/dL (ref 6.5–8.1)

## 2019-02-10 LAB — HEMOGLOBIN A1C
Hgb A1c MFr Bld: 7.3 % — ABNORMAL HIGH (ref 4.8–5.6)
Mean Plasma Glucose: 162.81 mg/dL

## 2019-02-10 LAB — VITAMIN D 25 HYDROXY (VIT D DEFICIENCY, FRACTURES): Vit D, 25-Hydroxy: 11.34 ng/mL — ABNORMAL LOW (ref 30–100)

## 2019-02-10 LAB — ECHOCARDIOGRAM COMPLETE
Height: 62 in
Weight: 3326.3 oz

## 2019-02-10 LAB — VITAMIN B12: Vitamin B-12: >7500 pg/mL — ABNORMAL HIGH (ref 180–914)

## 2019-02-10 LAB — GLUCOSE, CAPILLARY: Glucose-Capillary: 124 mg/dL — ABNORMAL HIGH (ref 70–99)

## 2019-02-10 MED ORDER — LEVOFLOXACIN 500 MG PO TABS
250.0000 mg | ORAL_TABLET | Freq: Every day | ORAL | Status: DC
  Filled 2019-02-10 (×2): qty 1

## 2019-02-10 MED ORDER — METOPROLOL TARTRATE 50 MG PO TABS
25.0000 mg | ORAL_TABLET | Freq: Two times a day (BID) | ORAL | Status: DC
  Administered 2019-02-10: 25 mg via ORAL
  Filled 2019-02-10: qty 1

## 2019-02-10 MED ORDER — LEVOFLOXACIN 250 MG PO TABS: 250.0000 mg | ORAL_TABLET | Freq: Every day | ORAL | 0 refills | Status: AC

## 2019-02-10 MED ORDER — METOPROLOL TARTRATE 25 MG PO TABS: 25.0000 mg | ORAL_TABLET | Freq: Two times a day (BID) | ORAL | 0 refills | Status: DC

## 2019-02-10 MED ORDER — LEVOFLOXACIN IN D5W 500 MG/100ML IV SOLN
500.0000 mg | Freq: Once | INTRAVENOUS | Status: AC
  Administered 2019-02-10: 500 mg via INTRAVENOUS
  Filled 2019-02-10: qty 100

## 2019-02-10 MED ORDER — INSULIN DETEMIR 100 UNIT/ML FLEXPEN: 32.0000 [IU] | PEN_INJECTOR | Freq: Every day | SUBCUTANEOUS | Status: DC

## 2019-02-10 MED ORDER — LEVEMIR FLEXTOUCH 100 UNIT/ML ~~LOC~~ SOPN: 30.0000 [IU] | PEN_INJECTOR | Freq: Every day | SUBCUTANEOUS | 11 refills | Status: DC

## 2019-02-10 MED ORDER — INSULIN DETEMIR 100 UNIT/ML ~~LOC~~ SOLN
32.0000 [IU] | Freq: Every day | SUBCUTANEOUS | Status: DC
  Administered 2019-02-10: 32 [IU] via SUBCUTANEOUS
  Filled 2019-02-10 (×3): qty 0.32

## 2019-02-10 NOTE — Discharge Instructions (Signed)

## 2019-02-10 NOTE — Discharge Summary (Addendum)
Physician Discharge Summary  Stephanie [name] ZSM:[id number] DOB: 1935/05/07 DOA: 02/09/2019  PCP: Abran Richard, MD  Admit date: 02/09/2019 Discharge date: 02/10/2019  Admitted From:  Home  Disposition:  Home   Recommendations for Outpatient Follow-up:  1. Follow up with PCP in 3-5 days for recheck.  2. Please discuss GI referral with patient.  She reports not interested in colonoscopy or GI work up.   3. Please obtain BMP/CBC in 1 week to follow Hg and renal function 4. Please follow up on the following pending results: final urine culture results and 2D echocardiogram 5. Follow up with cardiology in 2 weeks.   Discharge Condition: STABLE   CODE STATUS: FULL    Brief Hospitalization Summary: Please see all hospital notes, images, labs for full details of the hospitalization. ADMISSION HPI: Stephanie [name] is a 84 y.o. female with medical history significant of osteoarthritis, asthma, type 2 diabetes, hyperlipidemia, hypertension who is coming to the emergency department due to chills, headache, generalized body aches and weakness for the past 2 days.  She denies fever, sore throat or rhinorrhea.  Complains of dyspnea on exertion, wheezing or hemoptysis.  Postural dizziness, but denies chest pain, palpitations, diaphoresis, PND, orthopnea or pitting edema of the lower extremities.  She denies abdominal pain, nausea, vomiting, diarrhea, constipation, melena or hematochezia.  She has been experiencing some urgency, but denies dysuria, flank pain or hematuria.  No polyuria, polydipsia, polyphagia or blurred vision.  ED Course: Initial vital signs temperature 98.8 F, pulse 65, respirations 20, blood pressure 149/112 mmHg and O2 sat 95% on room air.  At 2 PRBC unit transfusion was started in the emergency department.  Urinalysis show a hazy appearance with large leukocyte esterase, 6-10 RBC and 21-50 WBC with rare bacteria on microscopic examination.  CBC showed a white count of 15.1 with  76% neutrophils, hemoglobin 6.3 g/dL and platelets 390.  Troponin was 211 and then 224 ng/L.  BMP shows a CO2 of 21 mmol/L, glucose of 196 and creatinine of 1.26 mg/dL.  The rest of the electrolytes and BUN were normal.  SARS coronavirus antigen and PCR were negative.  Ferritin was 2 ng/L, iron was 9 and TIBC 505 mcg/dL with saturation ratio of 2%.  Her chest radiograph showed stable cardiomegaly.  The patient has received 2 units of PRBC and feeling much better.  She was evaluated by PT and no PT follow up was recommended.  I discussed a GI work up with her as she reports she has not had a colonoscopy or GI work up.  She says that she is not interested in that at this time and does not want a GI consult.  She says she is afraid of these procedures.  I explained to her that the GI malignancy could be a cause of her anemia and that GI work up would likely be the only way to rule that out.  She says she would like to discuss it with her PCP before making a final decision.  I have recommended close follow up with PCP for recheck of CBC and BMP.   She likely has CKD and we are holding metformin and lisinopril. Metoprolol 25 mg BID ordered for blood pressure.  Close outpatient follow up with PCP and we have reduced her insulin doses in the setting of CKD to try to avoid hypoglycemia.    Discharge Diagnoses:  Principal Problem:   Symptomatic anemia Active Problems:   Uncontrolled type 2 diabetes mellitus with hyperglycemia ([place])  Essential hypertension, benign   Mixed hyperlipidemia   Acute lower UTI   Vitamin B12 deficiency  Discharge Instructions:  Allergies as of 02/10/2019      Reactions   Amoxicillin Swelling   Tongue swelling   Penicillins Swelling   Has patient had a PCN reaction causing immediate rash, facial/tongue/throat swelling, SOB or lightheadedness with hypotension: No Has patient had a PCN reaction causing severe rash involving mucus membranes or skin necrosis: No Has patient had a  PCN reaction that required hospitalization: No Has patient had a PCN reaction occurring within the last 10 years: No If all of the above answers are "NO", then may proceed with Cephalosporin use.   Sulfa Antibiotics Swelling   Lip swelling      Medication List    STOP taking these medications   lisinopril 20 MG tablet Commonly known as: ZESTRIL   metFORMIN 500 MG tablet Commonly known as: GLUCOPHAGE   triamterene-hydrochlorothiazide 37.5-25 MG tablet Commonly known as: MAXZIDE-25     TAKE these medications   albuterol (2.5 MG/3ML) 0.083% nebulizer solution Commonly known as: PROVENTIL Take 2.5 mg by nebulization every 6 (six) hours as needed for wheezing or shortness of breath.   atorvastatin 20 MG tablet Commonly known as: LIPITOR Take 20 mg by mouth every evening.   fluticasone 50 MCG/ACT nasal spray Commonly known as: FLONASE Place 2 sprays into both nostrils daily.   Levemir FlexTouch 100 UNIT/ML Pen Generic drug: Insulin Detemir Inject 30 Units into the skin at bedtime. What changed: how much to take   levofloxacin 250 MG tablet Commonly known as: LEVAQUIN Take 1 tablet (250 mg total) by mouth daily for 2 days. Start taking on: February 11, 2019   metoprolol tartrate 25 MG tablet Commonly known as: LOPRESSOR Take 1 tablet (25 mg total) by mouth 2 (two) times daily.      Follow-up Information    Abran Richard, MD. Schedule an appointment as soon as possible for a visit in 5 day(s).   Specialty: Internal Medicine Why: Hospital Follow Up  Contact information: [address] [place] [address] [phone]        Satira Sark, MD. Schedule an appointment as soon as possible for a visit in 2 week(s).   Specialty: Cardiology Contact information: [place] [address] [phone]          Allergies  Allergen Reactions  . Amoxicillin Swelling    Tongue swelling   . Penicillins Swelling    Has patient had a PCN  reaction causing immediate rash, facial/tongue/throat swelling, SOB or lightheadedness with hypotension: No Has patient had a PCN reaction causing severe rash involving mucus membranes or skin necrosis: No Has patient had a PCN reaction that required hospitalization: No Has patient had a PCN reaction occurring within the last 10 years: No If all of the above answers are "NO", then may proceed with Cephalosporin use.   . Sulfa Antibiotics Swelling    Lip swelling   Allergies as of 02/10/2019      Reactions   Amoxicillin Swelling   Tongue swelling   Penicillins Swelling   Has patient had a PCN reaction causing immediate rash, facial/tongue/throat swelling, SOB or lightheadedness with hypotension: No Has patient had a PCN reaction causing severe rash involving mucus membranes or skin necrosis: No Has patient had a PCN reaction that required hospitalization: No Has patient had a PCN reaction occurring within the last 10 years: No If all of the above  answers are "NO", then may proceed with Cephalosporin use.   Sulfa Antibiotics Swelling   Lip swelling      Medication List    STOP taking these medications   lisinopril 20 MG tablet Commonly known as: ZESTRIL   metFORMIN 500 MG tablet Commonly known as: GLUCOPHAGE   triamterene-hydrochlorothiazide 37.5-25 MG tablet Commonly known as: MAXZIDE-25     TAKE these medications   albuterol (2.5 MG/3ML) 0.083% nebulizer solution Commonly known as: PROVENTIL Take 2.5 mg by nebulization every 6 (six) hours as needed for wheezing or shortness of breath.   atorvastatin 20 MG tablet Commonly known as: LIPITOR Take 20 mg by mouth every evening.   fluticasone 50 MCG/ACT nasal spray Commonly known as: FLONASE Place 2 sprays into both nostrils daily.   Levemir FlexTouch 100 UNIT/ML Pen Generic drug: Insulin Detemir Inject 30 Units into the skin at bedtime. What changed: how much to take   levofloxacin 250 MG tablet Commonly known as:  LEVAQUIN Take 1 tablet (250 mg total) by mouth daily for 2 days. Start taking on: February 11, 2019   metoprolol tartrate 25 MG tablet Commonly known as: LOPRESSOR Take 1 tablet (25 mg total) by mouth 2 (two) times daily.      Procedures/Studies: DG Chest Portable 1 View  Result Date: 02/09/2019 CLINICAL DATA:  Chest pain. EXAM: PORTABLE CHEST 1 VIEW COMPARISON:  None. FINDINGS: There is no evidence of acute infiltrate, pleural effusion or pneumothorax. The cardiac silhouette is moderately enlarged and unchanged in size. The visualized skeletal structures are unremarkable. IMPRESSION: 1. Stable cardiomegaly without evidence of acute or active cardiopulmonary disease. Electronically Signed   By: Virgina Norfolk M.D.   On: 02/09/2019 17:49      Subjective: Pt says she feels much better after receiving blood transfusion.  She denies CP and SOB.  She denies palpitations.  I saw patient ambulating with PT and she was doing very well.  Discharge Exam: Vitals:   02/10/19 0242 02/10/19 0556  BP: (!) 151/72 (!) 158/64  Pulse: 73 73  Resp: 20 18  Temp: 98 F (36.7 C) 98.3 F (36.8 C)  SpO2: 100% 98%   Vitals:   02/09/19 2317 02/09/19 2345 02/10/19 0242 02/10/19 0556  BP:  118/89 (!) 151/72 (!) 158/64  Pulse:  74 73 73  Resp:  18 20 18   Temp:  99.1 F (37.3 C) 98 F (36.7 C) 98.3 F (36.8 C)  TempSrc:  Oral Oral Oral  SpO2:  99% 100% 98%  Weight: 94.3 kg     Height: 5\' 2"  (1.575 m)      General: Pt is alert, awake, not in acute distress Cardiovascular: RRR, S1/S2 +, no rubs, no gallops Respiratory: CTA bilaterally, no wheezing, no rhonchi Abdominal: Soft, NT, ND, bowel sounds + Extremities: no edema, no cyanosis   The results of significant diagnostics from this hospitalization (including imaging, microbiology, ancillary and laboratory) are listed below for reference.     Microbiology: Recent Results (from the past 240 hour(s))  Respiratory Panel by RT PCR (Flu A&B,  Covid) - Nasopharyngeal Swab     Status: None   Collection Time: 02/09/19  7:46 PM   Specimen: Nasopharyngeal Swab  Result Value Ref Range Status   SARS Coronavirus 2 by RT PCR NEGATIVE NEGATIVE Final    Comment: (NOTE) SARS-CoV-2 target nucleic acids are NOT DETECTED. The SARS-CoV-2 RNA is generally detectable in upper respiratoy specimens during the acute phase of infection. The lowest concentration of SARS-CoV-2 viral copies  this assay can detect is 131 copies/mL. A negative result does not preclude SARS-Cov-2 infection and should not be used as the sole basis for treatment or other patient management decisions. A negative result may occur with  improper specimen collection/handling, submission of specimen other than nasopharyngeal swab, presence of viral mutation(s) within the areas targeted by this assay, and inadequate number of viral copies (<131 copies/mL). A negative result must be combined with clinical observations, patient history, and epidemiological information. The expected result is Negative. Fact Sheet for Patients:  PinkCheek.be Fact Sheet for Healthcare Providers:  GravelBags.it This test is not yet ap proved or cleared by the [place] FDA and  has been authorized for detection and/or diagnosis of SARS-CoV-2 by FDA under an Emergency Use Authorization (EUA). This EUA will remain  in effect (meaning this test can be used) for the duration of the COVID-19 declaration under Section 564(b)(1) of the Act, 21 U.S.C. section 360bbb-3(b)(1), unless the authorization is terminated or revoked sooner.    Influenza A by PCR NEGATIVE NEGATIVE Final   Influenza B by PCR NEGATIVE NEGATIVE Final    Comment: (NOTE) The Xpert Xpress SARS-CoV-2/FLU/RSV assay is intended as an aid in  the diagnosis of influenza from Nasopharyngeal swab specimens and  should not be used as a sole basis for treatment. Nasal washings and   aspirates are unacceptable for Xpert Xpress SARS-CoV-2/FLU/RSV  testing. Fact Sheet for Patients: PinkCheek.be Fact Sheet for Healthcare Providers: GravelBags.it This test is not yet approved or cleared by the [place] FDA and  has been authorized for detection and/or diagnosis of SARS-CoV-2 by  FDA under an Emergency Use Authorization (EUA). This EUA will remain  in effect (meaning this test can be used) for the duration of the  Covid-19 declaration under Section 564(b)(1) of the Act, 21  U.S.C. section 360bbb-3(b)(1), unless the authorization is  terminated or revoked. Performed at [facility], [address], [place], [place] [address]      Labs: BNP (last 3 results) No results for input(s): BNP in the last 8760 hours. Basic Metabolic Panel: Recent Labs  Lab 02/09/19 1635 02/10/19 0610  NA 136 135  K 4.8 4.8  CL 104 101  CO2 21* 25  GLUCOSE 196* 123*  BUN 16 15  CREATININE 1.26* 1.20*  CALCIUM 9.5 9.6   Liver Function Tests: Recent Labs  Lab 02/10/19 0610  AST 13*  ALT 10  ALKPHOS 105  BILITOT 1.1  PROT 6.9  ALBUMIN 3.6   No results for input(s): LIPASE, AMYLASE in the last 168 hours. No results for input(s): AMMONIA in the last 168 hours. CBC: Recent Labs  Lab 02/09/19 1635 02/10/19 0610  WBC 15.1* 13.2*  NEUTROABS 11.5*  --   HGB 6.3* 8.6*  HCT 24.0* 30.5*  MCV 63.7* 69.2*  PLT 390 336   Cardiac Enzymes: No results for input(s): CKTOTAL, CKMB, CKMBINDEX, TROPONINI in the last 168 hours. BNP: Invalid input(s): POCBNP CBG: Recent Labs  Lab 02/09/19 2302 02/10/19 0726  GLUCAP 148* 124*   D-Dimer No results for input(s): DDIMER in the last 72 hours. Hgb A1c Recent Labs    02/10/19 0610  HGBA1C 7.3*   Lipid Profile No results for input(s): CHOL, HDL, LDLCALC, TRIG, CHOLHDL, LDLDIRECT in the last 72 hours. Thyroid function studies No results for input(s): TSH, T4TOTAL,  T3FREE, THYROIDAB in the last 72 hours.  Invalid input(s): FREET3 Anemia work up Recent Labs    02/09/19 1635 02/10/19 0610  VITAMINB12 103* >7,500*  FOLATE 16.9  --   FERRITIN 2*  --   TIBC 505*  --   IRON 9*  --   RETICCTPCT 2.4  --    Urinalysis    Component Value Date/Time   COLORURINE YELLOW 02/09/2019 1624   APPEARANCEUR HAZY (A) 02/09/2019 1624   LABSPEC 1.016 02/09/2019 1624   PHURINE 5.0 02/09/2019 1624   GLUCOSEU NEGATIVE 02/09/2019 1624   HGBUR NEGATIVE 02/09/2019 1624   BILIRUBINUR NEGATIVE 02/09/2019 1624   KETONESUR NEGATIVE 02/09/2019 1624   PROTEINUR NEGATIVE 02/09/2019 1624   UROBILINOGEN 0.2 04/25/2014 0936   NITRITE NEGATIVE 02/09/2019 1624   LEUKOCYTESUR LARGE (A) 02/09/2019 1624   Sepsis Labs Invalid input(s): PROCALCITONIN,  WBC,  LACTICIDVEN Microbiology Recent Results (from the past 240 hour(s))  Respiratory Panel by RT PCR (Flu A&B, Covid) - Nasopharyngeal Swab     Status: None   Collection Time: 02/09/19  7:46 PM   Specimen: Nasopharyngeal Swab  Result Value Ref Range Status   SARS Coronavirus 2 by RT PCR NEGATIVE NEGATIVE Final    Comment: (NOTE) SARS-CoV-2 target nucleic acids are NOT DETECTED. The SARS-CoV-2 RNA is generally detectable in upper respiratoy specimens during the acute phase of infection. The lowest concentration of SARS-CoV-2 viral copies this assay can detect is 131 copies/mL. A negative result does not preclude SARS-Cov-2 infection and should not be used as the sole basis for treatment or other patient management decisions. A negative result may occur with  improper specimen collection/handling, submission of specimen other than nasopharyngeal swab, presence of viral mutation(s) within the areas targeted by this assay, and inadequate number of viral copies (<131 copies/mL). A negative result must be combined with clinical observations, patient history, and epidemiological information. The expected result is  Negative. Fact Sheet for Patients:  PinkCheek.be Fact Sheet for Healthcare Providers:  GravelBags.it This test is not yet ap proved or cleared by the [place] FDA and  has been authorized for detection and/or diagnosis of SARS-CoV-2 by FDA under an Emergency Use Authorization (EUA). This EUA will remain  in effect (meaning this test can be used) for the duration of the COVID-19 declaration under Section 564(b)(1) of the Act, 21 U.S.C. section 360bbb-3(b)(1), unless the authorization is terminated or revoked sooner.    Influenza A by PCR NEGATIVE NEGATIVE Final   Influenza B by PCR NEGATIVE NEGATIVE Final    Comment: (NOTE) The Xpert Xpress SARS-CoV-2/FLU/RSV assay is intended as an aid in  the diagnosis of influenza from Nasopharyngeal swab specimens and  should not be used as a sole basis for treatment. Nasal washings and  aspirates are unacceptable for Xpert Xpress SARS-CoV-2/FLU/RSV  testing. Fact Sheet for Patients: PinkCheek.be Fact Sheet for Healthcare Providers: GravelBags.it This test is not yet approved or cleared by the [place] FDA and  has been authorized for detection and/or diagnosis of SARS-CoV-2 by  FDA under an Emergency Use Authorization (EUA). This EUA will remain  in effect (meaning this test can be used) for the duration of the  Covid-19 declaration under Section 564(b)(1) of the Act, 21  U.S.C. section 360bbb-3(b)(1), unless the authorization is  terminated or revoked. Performed at [facility], [address] [place], [place] [address]    Time coordinating discharge:   SIGNED:  Irwin Brakeman, MD  [facility] 02/10/2019, 11:17 AM How to contact the [facility] Attending or Consulting provider [place] or covering provider during after hours [place], for this patient?  1. Check the care team in [facility] and  look for a)  attending/consulting [facility] provider listed and b) the [facility] team listed 2. Log into [email or web address] and use [place]'s universal password to access. If you do not have the password, please contact the hospital operator. 3. Locate the [facility] provider you are looking for under [facility] and page to a number that you can be directly reached. 4. If you still have difficulty reaching the provider, please page the [facility] (Director on Call) for the Hospitalists listed on amion for assistance.

## 2019-02-10 NOTE — Progress Notes (Signed)
PHARMACY NOTE:  ANTIMICROBIAL RENAL DOSAGE ADJUSTMENT  Current antimicrobial regimen includes a mismatch between antimicrobial dosage and estimated renal function.  As per policy approved by the Pharmacy & Therapeutics and Medical Executive Committees, the antimicrobial dosage will be adjusted accordingly.  Current antimicrobial dosage:  Levaquin 500mg  daily Indication: UTI  Renal Function: Estimated Creatinine Clearance: 37.5 mL/min (A) (by C-G formula based on SCr of 1.26 mg/dL (H)).    Antimicrobial dosage has been changed to:  Levaquin 500mg  now then 250mg  daily  Additional comments:   Thank you for allowing pharmacy to be a part of this patient's care.  Sherlon Handing, PharmD, BCPS Please see amion for complete clinical pharmacist phone list 02/10/2019 2:51 AM

## 2019-02-10 NOTE — Progress Notes (Signed)
*  PRELIMINARY RESULTS* Echocardiogram 2D Echocardiogram has been performed.  Stephanie [name] 02/10/2019, 12:31 PM

## 2019-02-10 NOTE — Evaluation (Signed)
Physical Therapy Evaluation Patient Details Name: Stephanie [name] MRN: [id number] DOB: August 17, 1935 Today's Date: 02/10/2019   History of Present Illness  Stephanie [name] is a 84 y.o. female with medical history significant of osteoarthritis, asthma, type 2 diabetes, hyperlipidemia, hypertension who is coming to the emergency department due to chills, headache, generalized body aches and weakness for the past 2 days.  She denies fever, sore throat or rhinorrhea.  Complains of dyspnea on exertion, wheezing or hemoptysis.  Postural dizziness, but denies chest pain, palpitations, diaphoresis, PND, orthopnea or pitting edema of the lower extremities.  She denies abdominal pain, nausea, vomiting, diarrhea, constipation, melena or hematochezia.  She has been experiencing some urgency, but denies dysuria, flank pain or hematuria.  No polyuria, polydipsia, polyphagia or blurred vision.    Clinical Impression  Patient at baseline level of function for gait and mobility. She transfers to standing easily with use of RW and is able to ambulate with supervision. She requires verbal cueing and demonstration for staying inside RW and not letting it get too far in front of her. She requires 1 rest break during ambulation. She is returned to her chair at end of session. Patient discharged to care of nursing for ambulation daily as tolerated for length of stay.    Follow Up Recommendations No PT follow up    Equipment Recommendations  None recommended by PT    Recommendations for Other Services       Precautions / Restrictions Precautions Precautions: Fall Restrictions Weight Bearing Restrictions: No      Mobility  Bed Mobility Overal bed mobility: Modified Independent                Transfers Overall transfer level: Modified independent Equipment used: Rolling walker (2 wheeled)             General transfer comment: able to transfer to standing from EOB with use of  RW  Ambulation/Gait Ambulation/Gait assistance: Supervision Gait Distance (Feet): 150 Feet Assistive device: Rolling walker (2 wheeled) Gait Pattern/deviations: Step-through pattern;Decreased step length - right;Decreased step length - left;Decreased stride length Gait velocity: decreased   General Gait Details: slightly slow, able to ambulate with RW, 1 standing rest break during  Stairs            Wheelchair Mobility    Modified Rankin (Stroke Patients Only)       Balance Overall balance assessment: Needs assistance Sitting-balance support: No upper extremity supported;Feet supported Sitting balance-Leahy Scale: Good Sitting balance - Comments: seated EOB   Standing balance support: Bilateral upper extremity supported;During functional activity Standing balance-Leahy Scale: Good Standing balance comment: using RW                             Pertinent Vitals/Pain Pain Assessment: No/denies pain    Home Living Family/patient expects to be discharged to:: Private residence Living Arrangements: Other relatives Available Help at Discharge: Family;Available 24 hours/day Type of Home: Mobile home Home Access: Stairs to enter Entrance Stairs-Rails: Can reach both Entrance Stairs-Number of Steps: 3 Home Layout: One level Home Equipment: Walker - 2 wheels;Walker - standard;Cane - single point;Shower seat Additional Comments: Patient states she uses RW "sometimes"    Prior Function Level of Independence: Needs assistance   Gait / Transfers Assistance Needed: limited community ambulator with AD  ADL's / Homemaking Assistance Needed: Grand daughter assists        Hand Dominance  Extremity/Trunk Assessment   Upper Extremity Assessment Upper Extremity Assessment: Overall WFL for tasks assessed    Lower Extremity Assessment Lower Extremity Assessment: Overall WFL for tasks assessed       Communication   Communication: No difficulties   Cognition Arousal/Alertness: Awake/alert Behavior During Therapy: WFL for tasks assessed/performed Overall Cognitive Status: Within Functional Limits for tasks assessed                                        General Comments      Exercises     Assessment/Plan    PT Assessment Patent does not need any further PT services  PT Problem List Decreased strength;Decreased activity tolerance;Decreased balance;Decreased mobility;Decreased knowledge of use of DME       PT Treatment Interventions      PT Goals (Current goals can be found in the Care Plan section)  Acute Rehab PT Goals Patient Stated Goal: Return home with family to assist PT Goal Formulation: With patient Time For Goal Achievement: 02/10/19 Potential to Achieve Goals: Good    Frequency     Barriers to discharge        Co-evaluation               AM-PAC PT "6 Clicks" Mobility  Outcome Measure Help needed turning from your back to your side while in a flat bed without using bedrails?: None Help needed moving from lying on your back to sitting on the side of a flat bed without using bedrails?: None Help needed moving to and from a bed to a chair (including a wheelchair)?: None Help needed standing up from a chair using your arms (e.g., wheelchair or bedside chair)?: None Help needed to walk in hospital room?: None Help needed climbing 3-5 steps with a railing? : A Little 6 Click Score: 23    End of Session Equipment Utilized During Treatment: Gait belt Activity Tolerance: Patient tolerated treatment well Patient left: in chair;with chair alarm set;with call bell/phone within reach Nurse Communication: Mobility status PT Visit Diagnosis: Unsteadiness on feet (R26.81);Other abnormalities of gait and mobility (R26.89);Muscle weakness (generalized) (M62.81)    Time: 0539-7673 PT Time Calculation (min) (ACUTE ONLY): 26 min   Charges:   PT Evaluation $PT Eval Low Complexity: 1 Low PT  Treatments $Therapeutic Activity: 23-37 mins        9:52 AM, 02/10/19 Mearl Latin PT, DPT Physical Therapist at [facility]

## 2019-02-11 LAB — URINE CULTURE

## 2019-03-19 ENCOUNTER — Telehealth: Payer: Self-pay

## 2019-04-13 ENCOUNTER — Encounter: Payer: Self-pay | Admitting: *Deleted

## 2019-04-13 ENCOUNTER — Ambulatory Visit: Payer: Medicare HMO | Admitting: Cardiology

## 2019-04-13 ENCOUNTER — Encounter: Payer: Self-pay | Admitting: Cardiology

## 2019-04-13 VITALS — BP 116/62 | HR 59 | Temp 97.2°F | Ht 66.0 in | Wt 205.6 lb

## 2019-04-13 DIAGNOSIS — I350 Nonrheumatic aortic (valve) stenosis: Secondary | ICD-10-CM | POA: Diagnosis not present

## 2019-04-13 DIAGNOSIS — Z8719 Personal history of other diseases of the digestive system: Secondary | ICD-10-CM | POA: Diagnosis not present

## 2019-04-13 DIAGNOSIS — E782 Mixed hyperlipidemia: Secondary | ICD-10-CM

## 2019-04-13 DIAGNOSIS — I10 Essential (primary) hypertension: Secondary | ICD-10-CM | POA: Diagnosis not present

## 2019-04-13 NOTE — Patient Instructions (Signed)
Medication Instructions:   Your physician recommends that you continue on your current medications as directed. Please refer to the Current Medication list given to you today.  Labwork:  NONE  Testing/Procedures: Your physician has requested that you have an echocardiogram in 4 months (July 2021) just before your next visit Echocardiography is a painless test that uses sound waves to create images of your heart. It provides your doctor with information about the size and shape of your heart and how well your heart's chambers and valves are working. This procedure takes approximately one hour. There are no restrictions for this procedure.  Follow-Up:  Your physician recommends that you schedule a follow-up appointment in: 4 months (office).  Any Other Special Instructions Will Be Listed Below (If Applicable).  If you need a refill on your cardiac medications before your next appointment, please call your pharmacy.

## 2019-04-13 NOTE — Progress Notes (Signed)
Cardiology Office Note  Date: 04/13/2019   ID: KAMERON GLAZEBROOK, DOB April 01, 1935, MRN [id number]  PCP:  Abran Richard, MD  Consulting Cardiologist:  Rozann Lesches, MD Electrophysiologist:  None   Chief Complaint  Patient presents with  . History of heart murmur    History of Present Illness: Stephanie [name] is an 84 y.o. female referred for cardiology consultation with incomplete information provided.  Based on chart review I see that she was admitted to [facility] in January with symptomatic anemia, UTI, renal insufficiency, and mild increase in troponin I.  She did have an echocardiogram obtained during that admission which reported normal LVEF at 55 to 60% and moderate to severe aortic stenosis (presumably the reason for consultation).  I reviewed her records.  She was evaluated by Dr. Gennette Pac with [facility] cardiology in August 2020.  Notes indicate history of aortic stenosis, mild to moderate as of 2016.  Also risk factors for CAD but no documented history.  She is here today with her daughter.  We discussed aortic stenosis, she states that she had not been told this in the past, but has been told that she has a heart murmur.  She lives in her own home, has a granddaughter that helps her with ADLs during the day.  She is limited by chronic back pain, reports previous back surgery.  She does not describe any exertional angina at this time, no orthopnea or PND.  She has been on iron, denies any obvious blood in her stools.  She has not decided about having further GI evaluation with colonoscopy, although this has been recommended to her.  Past Medical History:  Diagnosis Date  . Aortic stenosis   . Arthritis   . Asthma   . Essential hypertension   . Mixed hyperlipidemia   . Symptomatic anemia    Presumed GI bleed January 2021 - she declined GI work-up  . Type 2 diabetes mellitus ([place])     Past Surgical History:  Procedure Laterality Date  . APPENDECTOMY    . CYST REMOVAL  HAND     Right, hospital in [place] ARTHROSCOPY WITH MEDIAL MENISECTOMY Right 02/28/2012   Procedure: KNEE ARTHROSCOPY WITH MEDIAL MENISECTOMY;  Surgeon: Carole Civil, MD;  Location: [facility];  Service: Orthopedics;  Laterality: Right;  . REPAIR KNEE LIGAMENT      Current Outpatient Medications  Medication Sig Dispense Refill  . albuterol (PROVENTIL) (2.5 MG/3ML) 0.083% nebulizer solution Take 2.5 mg by nebulization every 6 (six) hours as needed for wheezing or shortness of breath.   2  . atorvastatin (LIPITOR) 20 MG tablet Take 20 mg by mouth every evening.     Marland Kitchen FEROSUL 325 (65 Fe) MG tablet Take 325 mg by mouth daily.    . fluticasone (FLONASE) 50 MCG/ACT nasal spray Place 2 sprays into both nostrils daily.     . hydrOXYzine (ATARAX/VISTARIL) 25 MG tablet Take 25 mg by mouth 3 (three) times daily.    . Insulin Detemir (LEVEMIR FLEXTOUCH) 100 UNIT/ML Pen Inject 30 Units into the skin at bedtime. 15 mL 11  . lisinopril (ZESTRIL) 20 MG tablet Take 20 mg by mouth daily.    . metFORMIN (GLUMETZA) 500 MG (MOD) 24 hr tablet Take 500 mg by mouth daily with breakfast.    . triamterene-hydrochlorothiazide (DYAZIDE) 37.5-25 MG capsule Take 1 capsule by mouth daily.    . Vitamin D, Ergocalciferol, (DRISDOL) 1.25 MG (50000 UNIT) CAPS capsule  No current facility-administered medications for this visit.   Allergies:  Amoxicillin, Penicillins, and Sulfa antibiotics   Social History: The patient  reports that she has never smoked. She has never used smokeless tobacco. She reports that she does not drink alcohol or use drugs.   Family History: The patient's family history includes Arthritis in an other family member; Asthma in an other family member; Cancer in an other family member; Diabetes in her brother, father, mother, and another family member; Hypertension in her mother and sister; Lung disease in an other family member.   ROS:   No palpitations or syncope.  Physical Exam: VS:  BP  116/62   Pulse (!) 59   Temp (!) 97.2 F (36.2 C)   Ht 5\' 6"  (1.676 m)   Wt 205 lb 9.6 oz (93.3 kg)   SpO2 93%   BMI 33.18 kg/m , BMI Body mass index is 33.18 kg/m.  Wt Readings from Last 3 Encounters:  04/13/19 205 lb 9.6 oz (93.3 kg)  02/09/19 207 lb 14.3 oz (94.3 kg)  11/26/18 200 lb (90.7 kg)    General: Elderly woman, appears comfortable at rest. HEENT: Conjunctiva and lids normal, wearing a mask. Neck: Supple, no elevated JVP, radiation of heart murmur to carotids, no thyromegaly. Lungs: Clear to auscultation, nonlabored breathing at rest. Cardiac: Regular rate and rhythm with ectopy, no S3, 1-9/1 systolic murmur, no pericardial rub. Abdomen: Soft, nontender, bowel sounds present. Extremities: No pitting edema, distal pulses 2+. Skin: Warm and dry. Musculoskeletal: No kyphosis. Neuropsychiatric: Alert and oriented x3, affect grossly appropriate.  ECG:  An ECG dated 02/09/2019 was personally reviewed today and demonstrated:  Probable sinus rhythm with frequent PACs, poor R wave progression, repolarization abnormalities.  Recent Labwork: 02/10/2019: ALT 10; AST 13; BUN 15; Creatinine, Ser 1.20; Hemoglobin 8.6; Platelets 336; Potassium 4.8; Sodium 135   Other Studies Reviewed Today:  Echocardiogram 02/10/2019: 1. Left ventricular ejection fraction, by visual estimation, is 55 to  60%. The left ventricle has normal function. There is moderately increased  left ventricular hypertrophy.  2. Elevated left ventricular end-diastolic pressure.  3. Left ventricular diastolic parameters are consistent with Grade I  diastolic dysfunction (impaired relaxation).  4. The left ventricle has no regional wall motion abnormalities.  5. Global right ventricle has normal systolic function.The right  ventricular size is normal. No increase in right ventricular wall  thickness.  6. Left atrial size was mildly dilated.  7. Right atrial size was normal.  8. Mild to moderate mitral  annular calcification.  9. The mitral valve is grossly normal. Mild mitral valve regurgitation.  10. The tricuspid valve is grossly normal.  11. The tricuspid valve is grossly normal. Tricuspid valve regurgitation  is mild.  12. The aortic valve is tricuspid. Aortic valve regurgitation is trivial.  Moderate to severe aortic valve stenosis.  13. The pulmonic valve was not well visualized. Pulmonic valve  regurgitation is not visualized.  14. Normal pulmonary artery systolic pressure.  15. The inferior vena cava is normal in size with greater than 50%  respiratory variability, suggesting right atrial pressure of 3 mmHg.   Assessment and Plan:  1.  Moderate to severe calcific aortic stenosis by echocardiogram in January of this year.  Mean gradient was 19.5 mmHg, dimensionless index 0.32.  Patient reports a longstanding history of heart murmur.  She is functionally limited by chronic back pain, not describing active angina at this point.  We will plan strategy of observation for now, repeat echocardiogram  in about 4 months with clinical reevaluation.  She might ultimately be a candidate for TAVR evaluation, although GI status would need to be clarified prior to that.  2.  History of symptomatic anemia, hospitalized in January with hemoglobin of 6.3 and apparent GI bleed requiring PRBCs.  She declined further GI work-up at that time.  States that she is discussing this further with PCP.  She would need to have further GI assessment, most likely colonoscopy prior to being able to consider addressing her aortic stenosis as she would need antiplatelet agents that would further increase her bleeding risk.  Her aortic stenosis would not be a reason not to pursue colonoscopy.    3.  Essential hypertension, currently on Zestril and Dyazide.  Blood pressure is normal today.  4.  Mixed hyperlipidemia, on Lipitor.  Medication Adjustments/Labs and Tests Ordered: Current medicines are reviewed at length  with the patient today.  Concerns regarding medicines are outlined above.   Tests Ordered: Orders Placed This Encounter  Procedures  . ECHOCARDIOGRAM COMPLETE    Medication Changes: No orders of the defined types were placed in this encounter.   Disposition:  Follow up 4 months in the [place] office.  Signed, Satira Sark, MD, [facility] 04/13/2019 11:03 AM    [place] [facility] [place]. [address] [place], [place] [address] Phone: [phone]; Fax: [phone]

## 2019-05-04 ENCOUNTER — Encounter: Payer: Self-pay | Admitting: Internal Medicine

## 2019-05-25 ENCOUNTER — Telehealth: Payer: Self-pay | Admitting: Internal Medicine

## 2019-05-25 ENCOUNTER — Ambulatory Visit: Payer: Medicare HMO | Admitting: Gastroenterology

## 2019-05-25 ENCOUNTER — Encounter: Payer: Self-pay | Admitting: Internal Medicine

## 2019-05-25 NOTE — Telephone Encounter (Signed)
Patient was a no show and letter sent  °

## 2019-05-25 NOTE — Progress Notes (Deleted)
Primary Care Physician:  Abran Richard, MD  Primary Gastroenterologist:    No chief complaint on file.   HPI:  Stephanie [name] is a 84 y.o. female here for consideration of EGD and colonoscopy for history of iron deficiency anemia.  Patient was hospitalized in January 2020 with generalized body aches, chills, dyspnea on exertion, cough.  Covid test negative.  She was found to have profound iron deficiency anemia.  Received 2 units of packed red blood cells.  At that time she declined GI work-up.  Hemoccult status in 9. In 2019, patient's hemoglobin was in the 10 range.  November 2020, hemoglobin of 8.1, MCV 65.4.  January 2021, hemoglobin down to 6.3, MCV 63.7, ferritin of 2, B12 103, folate normal.  CT A/P with contrast in November 2020 showed mild sigmoid colon diverticulitis, stable hepatic and renal cyst.  Current Outpatient Medications  Medication Sig Dispense Refill  . albuterol (PROVENTIL) (2.5 MG/3ML) 0.083% nebulizer solution Take 2.5 mg by nebulization every 6 (six) hours as needed for wheezing or shortness of breath.   2  . atorvastatin (LIPITOR) 20 MG tablet Take 20 mg by mouth every evening.     Marland Kitchen FEROSUL 325 (65 Fe) MG tablet Take 325 mg by mouth daily.    . fluticasone (FLONASE) 50 MCG/ACT nasal spray Place 2 sprays into both nostrils daily.     . hydrOXYzine (ATARAX/VISTARIL) 25 MG tablet Take 25 mg by mouth 3 (three) times daily.    . Insulin Detemir (LEVEMIR FLEXTOUCH) 100 UNIT/ML Pen Inject 30 Units into the skin at bedtime. 15 mL 11  . lisinopril (ZESTRIL) 20 MG tablet Take 20 mg by mouth daily.    . metFORMIN (GLUMETZA) 500 MG (MOD) 24 hr tablet Take 500 mg by mouth daily with breakfast.    . triamterene-hydrochlorothiazide (DYAZIDE) 37.5-25 MG capsule Take 1 capsule by mouth daily.    . Vitamin D, Ergocalciferol, (DRISDOL) 1.25 MG (50000 UNIT) CAPS capsule      No current facility-administered medications for this visit.    Allergies as of 05/25/2019 - Review  Complete 04/13/2019  Allergen Reaction Noted  . Amoxicillin Swelling 11/04/2013  . Penicillins Swelling 07/06/2011  . Sulfa antibiotics Swelling 11/04/2013    Past Medical History:  Diagnosis Date  . Aortic stenosis   . Arthritis   . Asthma   . Essential hypertension   . Mixed hyperlipidemia   . Symptomatic anemia    Presumed GI bleed January 2021 - she declined GI work-up  . Type 2 diabetes mellitus ([place])     Past Surgical History:  Procedure Laterality Date  . APPENDECTOMY    . CYST REMOVAL HAND     Right, hospital in [place] ARTHROSCOPY WITH MEDIAL MENISECTOMY Right 02/28/2012   Procedure: KNEE ARTHROSCOPY WITH MEDIAL MENISECTOMY;  Surgeon: Carole Civil, MD;  Location: [facility];  Service: Orthopedics;  Laterality: Right;  . REPAIR KNEE LIGAMENT      Family History  Problem Relation Age of Onset  . Lung disease Other   . Cancer Other   . Arthritis Other   . Asthma Other   . Diabetes Other   . Diabetes Mother   . Hypertension Mother   . Diabetes Father   . Hypertension Sister   . Diabetes Brother     Social History   Socioeconomic History  . Marital status: Divorced    Spouse name: Not on file  . Number of children: Not on file  . Years of  education: Not on file  . Highest education level: Not on file  Occupational History  . Not on file  Tobacco Use  . Smoking status: Never Smoker  . Smokeless tobacco: Never Used  Substance and Sexual Activity  . Alcohol use: No  . Drug use: No  . Sexual activity: Never  Other Topics Concern  . Not on file  Social History Narrative   ** Merged History Encounter **       Social Determinants of Health   Financial Resource Strain:   . Difficulty of Paying Living Expenses:   Food Insecurity:   . Worried About Charity fundraiser in the Last Year:   . Arboriculturist in the Last Year:   Transportation Needs:   . Film/video editor (Medical):   Marland Kitchen Lack of Transportation (Non-Medical):   Physical  Activity:   . Days of Exercise per Week:   . Minutes of Exercise per Session:   Stress:   . Feeling of Stress :   Social Connections:   . Frequency of Communication with Friends and Family:   . Frequency of Social Gatherings with Friends and Family:   . Attends Religious Services:   . Active Member of Clubs or Organizations:   . Attends Archivist Meetings:   Marland Kitchen Marital Status:   Intimate Partner Violence:   . Fear of Current or Ex-Partner:   . Emotionally Abused:   Marland Kitchen Physically Abused:   . Sexually Abused:       ROS:  General: Negative for anorexia, weight loss, fever, chills, fatigue, weakness. Eyes: Negative for vision changes.  ENT: Negative for hoarseness, difficulty swallowing , nasal congestion. CV: Negative for chest pain, angina, palpitations, dyspnea on exertion, peripheral edema.  Respiratory: Negative for dyspnea at rest, dyspnea on exertion, cough, sputum, wheezing.  GI: See history of present illness. GU:  Negative for dysuria, hematuria, urinary incontinence, urinary frequency, nocturnal urination.  MS: Negative for joint pain, low back pain.  Derm: Negative for rash or itching.  Neuro: Negative for weakness, abnormal sensation, seizure, frequent headaches, memory loss, confusion.  Psych: Negative for anxiety, depression, suicidal ideation, hallucinations.  Endo: Negative for unusual weight change.  Heme: Negative for bruising or bleeding. Allergy: Negative for rash or hives.    Physical Examination:  There were no vitals taken for this visit.   General: Well-nourished, well-developed in no acute distress.  Head: Normocephalic, atraumatic.   Eyes: Conjunctiva pink, no icterus. Mouth: Oropharyngeal mucosa moist and pink , no lesions erythema or exudate. Neck: Supple without thyromegaly, masses, or lymphadenopathy.  Lungs: Clear to auscultation bilaterally.  Heart: Regular rate and rhythm, no murmurs rubs or gallops.  Abdomen: Bowel sounds are  normal, nontender, nondistended, no hepatosplenomegaly or masses, no abdominal bruits or    hernia , no rebound or guarding.   Rectal: *** Extremities: No lower extremity edema. No clubbing or deformities.  Neuro: Alert and oriented x 4 , grossly normal neurologically.  Skin: Warm and dry, no rash or jaundice.   Psych: Alert and cooperative, normal mood and affect.  Labs: Labs from March 2021: Creatinine 1.21, BUN 17, white blood cell count 9200, hemoglobin 10.7, hematocrit 35.1, MCV 72.8, platelets 253,000, iron 39, iron saturation 10%, TIBC 397, total bilirubin 0.5, alk phos 138, AST 16, ALT 8, albumin 4.1  Imaging Studies:  CLINICAL DATA:  Abdominal pain, question of appendicitis  EXAM: CT ABDOMEN AND PELVIS WITH CONTRAST  TECHNIQUE: Multidetector CT imaging of the abdomen  and pelvis was performed using the standard protocol following bolus administration of intravenous contrast.  CONTRAST:  160m OMNIPAQUE IOHEXOL 300 MG/ML  SOLN  COMPARISON:  August 17, 2018  FINDINGS: Lower chest: There is moderate cardiomegaly as on the prior exam.  A tiny hiatal hernia seen.  The visualized portions of the lungs are clear.  Hepatobiliary: Small low-density lesions are seen throughout the liver parenchyma which are unchanged from the prior exam the largest in the anterior left liver lobe measuring 2.4 cm, these are likely hepatic cysts.The main portal vein is patent. No evidence of calcified gallstones, gallbladder wall thickening or biliary dilatation.  Pancreas: Unremarkable. No pancreatic ductal dilatation or surrounding inflammatory changes.  Spleen: Normal in size without focal abnormality.  Adrenals/Urinary Tract: Both adrenal glands appear normal. Bilateral low-density lesions are seen within both kidneys, unchanged from prior exam the largest measuring 2.3 cm in the upper pole of the left kidney, likely simple renal cysts. No hydronephrosis is seen. The bladder  is partially decompressed.  Stomach/Bowel: The stomach and small bowel are normal in appearance.Scattered colonic diverticula are noted with question of mild fat stranding changes seen around a focal segment of the sigmoid colon and best seen on series 2, image 54. Surrounding free air or loculated fluid collections are seen. The patient is status post appendectomy.  Vascular/Lymphatic: There are no enlarged mesenteric, retroperitoneal, or pelvic lymph nodes. Scattered aortic atherosclerotic calcifications are seen without aneurysmal dilatation.  Reproductive: The uterus and adnexa are unremarkable.  Other: No evidence of abdominal wall mass or hernia.  Musculoskeletal: No acute or significant osseous findings. Patient has had prior decompression and posterior fixation from L2 through S1.  IMPRESSION: 1. Findings suggestive of mild sigmoid colonic diverticulitis. No pericolonic free air or fluid collections. 2. Patient is status post appendectomy. 3. Stable hepatic and renal cysts. 4.  Aortic Atherosclerosis (ICD10-I70.0).   Electronically Signed   By: BPrudencio PairM.D.   On: 11/26/2018 21:00

## 2019-05-28 ENCOUNTER — Encounter: Payer: Self-pay | Admitting: Internal Medicine

## 2019-06-12 ENCOUNTER — Ambulatory Visit (HOSPITAL_COMMUNITY)
Admission: RE | Admit: 2019-06-12 | Discharge: 2019-06-12 | Disposition: A | Discharge status: Home / Self Care | Payer: Medicare HMO | Source: Ambulatory Visit | Attending: Cardiology | Admitting: Cardiology

## 2019-06-12 ENCOUNTER — Other Ambulatory Visit: Payer: Self-pay

## 2019-06-12 DIAGNOSIS — I350 Nonrheumatic aortic (valve) stenosis: Secondary | ICD-10-CM | POA: Insufficient documentation

## 2019-06-12 NOTE — Progress Notes (Signed)
*  PRELIMINARY RESULTS* Echocardiogram 2D Echocardiogram has been performed.  Leavy Cella 06/12/2019, 11:36 AM

## 2019-06-20 ENCOUNTER — Other Ambulatory Visit: Payer: Self-pay

## 2019-06-20 ENCOUNTER — Ambulatory Visit (INDEPENDENT_AMBULATORY_CARE_PROVIDER_SITE_OTHER): Payer: Medicare HMO | Admitting: Gastroenterology

## 2019-06-20 ENCOUNTER — Encounter: Payer: Self-pay | Admitting: Gastroenterology

## 2019-06-20 VITALS — BP 151/61 | HR 70 | Temp 96.9°F | Ht 65.0 in | Wt 205.6 lb

## 2019-06-20 DIAGNOSIS — D649 Anemia, unspecified: Secondary | ICD-10-CM

## 2019-06-20 NOTE — Progress Notes (Signed)
  Patient presented to office for evaluation of IDA and need for TCS/EGD. With further discussion she reported that she has already established care with provider in [place] for her colonoscopy/EGD pending cardiac clearance. I confirmed with her daughter, Lattie Haw, who was with the patient. She was not sure why she was also referred to [place]. She asked I cancel today's appt.   We agreed to cancel appt. I extended welcome for return visit if need in the future.

## 2019-07-16 ENCOUNTER — Telehealth: Payer: Self-pay | Admitting: *Deleted

## 2019-07-16 NOTE — Telephone Encounter (Signed)
   [facility] Pre-operative Risk Assessment    HEARTCARE STAFF: - Please ensure there is not already an duplicate clearance open for this procedure. - Under Visit Info/Reason for Call, type in Other and utilize the format Clearance MM/DD/YY or Clearance TBD. Do not use dashes or single digits. - If request is for dental extraction, please clarify the # of teeth to be extracted.  Request for surgical clearance:  1. What type of surgery is being performed? EGD AND COLONOSCOPY   2. When is this surgery scheduled? TBD   3. What type of clearance is required (medical clearance vs. Pharmacy clearance to hold med vs. Both)? MEDICAL  4. Are there any medications that need to be held prior to surgery and how long? NONE LISTED   5. Practice name and name of physician performing surgery? [facility]; DR. Anne Hahn   6. What is the office phone number? [phone]   7.   What is the office fax number? [phone]  8.   Anesthesia type (None, local, MAC, general) ? NOT LISTED   Stephanie [name] 07/16/2019, 5:46 PM  _________________________________________________________________   (provider comments below)

## 2019-07-17 NOTE — Telephone Encounter (Signed)
   Primary Cardiologist: Rozann Lesches, MD  Chart reviewed as part of pre-operative protocol coverage. Patient was contacted 07/17/2019 in reference to pre-operative risk assessment for pending surgery as outlined below.  Stephanie [name] was last seen on 04/13/19 by Dr. Domenic Polite. She has history of symptomatic anemia, aortic stenosis, chronic back pain, asthma, arthritis, HTN, HLD, DM. Last echo 05/2019 with EF 55-60%, moderate aortic stenosis with mild aortic regurgitation. Per Dr. Myles Gip last note, "Her aortic stenosis would not be a reason not to pursue colonoscopy." She has no known history of CAD or CHF. RCRI 0.9% indicating low risk of CV complications procedurally. Reached out to patient to assure no new progressive cardiac symptoms. LMOMTCB.  Charlie Pitter, PA-C 07/17/2019, 9:56 AM

## 2019-07-18 NOTE — Telephone Encounter (Signed)
   Primary Cardiologist: Rozann Lesches, MD  Chart reviewed as part of pre-operative protocol coverage. Given past medical history and time since last visit, based on ACC/AHA guidelines, Stephanie [name] would be at acceptable risk for the planned procedure without further cardiovascular testing.   I will route this recommendation to the requesting party via Epic fax function and remove from pre-op pool.  Please call with questions.  Jossie Ng. Yousuf Ager NP-C    07/18/2019, 11:28 AM [address] Office [phone] Fax [phone]

## 2019-08-15 ENCOUNTER — Other Ambulatory Visit (HOSPITAL_COMMUNITY): Payer: Medicare HMO

## 2019-08-15 ENCOUNTER — Ambulatory Visit: Payer: Medicare HMO | Admitting: Cardiology

## 2019-09-19 ENCOUNTER — Other Ambulatory Visit: Payer: Self-pay

## 2019-09-19 ENCOUNTER — Ambulatory Visit (INDEPENDENT_AMBULATORY_CARE_PROVIDER_SITE_OTHER): Payer: Medicare HMO | Admitting: Student

## 2019-09-19 ENCOUNTER — Encounter: Payer: Self-pay | Admitting: Student

## 2019-09-19 VITALS — BP 132/68 | HR 72 | Wt 203.4 lb

## 2019-09-19 DIAGNOSIS — Z01810 Encounter for preprocedural cardiovascular examination: Secondary | ICD-10-CM

## 2019-09-19 DIAGNOSIS — I350 Nonrheumatic aortic (valve) stenosis: Secondary | ICD-10-CM

## 2019-09-19 DIAGNOSIS — E782 Mixed hyperlipidemia: Secondary | ICD-10-CM | POA: Diagnosis not present

## 2019-09-19 DIAGNOSIS — Z01818 Encounter for other preprocedural examination: Secondary | ICD-10-CM

## 2019-09-19 DIAGNOSIS — I10 Essential (primary) hypertension: Secondary | ICD-10-CM

## 2019-09-19 NOTE — Patient Instructions (Signed)
Medication Instructions:  Your physician recommends that you continue on your current medications as directed. Please refer to the Current Medication list given to you today.  *If you need a refill on your cardiac medications before your next appointment, please call your pharmacy*   Lab Work: NONE   If you have labs (blood work) drawn today and your tests are completely normal, you will receive your results only by: . MyChart Message (if you have MyChart) OR . A paper copy in the mail If you have any lab test that is abnormal or we need to change your treatment, we will call you to review the results.   Testing/Procedures: NONE    Follow-Up: At [facility], you and your health needs are our priority.  As part of our continuing mission to provide you with exceptional heart care, we have created designated Provider Care Teams.  These Care Teams include your primary Cardiologist (physician) and Advanced Practice Providers (APPs -  Physician Assistants and Nurse Practitioners) who all work together to provide you with the care you need, when you need it.  We recommend signing up for the patient portal called "MyChart".  Sign up information is provided on this After Visit Summary.  MyChart is used to connect with patients for Virtual Visits (Telemedicine).  Patients are able to view lab/test results, encounter notes, upcoming appointments, etc.  Non-urgent messages can be sent to your provider as well.   To learn more about what you can do with MyChart, go to [email or web address].    Your next appointment:   6 month(s)  The format for your next appointment:   In Person  Provider:   Samuel McDowell, MD or Brittany Strader, PA-C   Other Instructions Thank you for choosing [facility]!    

## 2019-09-19 NOTE — Progress Notes (Signed)
Cardiology Office Note    Date:  09/19/2019   ID:  Stephanie [name], DOB September 11, 1935, MRN [id number]  PCP:  Abran Richard, MD  Cardiologist: Rozann Lesches, MD    Chief Complaint  Patient presents with  . Follow-up    4 month visit    History of Present Illness:    Stephanie [name] is a 84 y.o. female with past medical history of HTN, HLD, IDDM, anemia and aortic stenosis who presents to the office today for 19-month follow-up.  She was last examined by Dr. Domenic Polite in 03/2019 as a new patient referral after a hospitalization for symptomatic anemia and a UTI during which an echocardiogram was obtained and showed a preserved EF of 55 to 60% but she did have moderate to severe aortic stenosis. She denied any anginal symptoms at that time but her activity was limited secondary to chronic back pain.  Continued observation was recommended with plans for repeat echocardiogram and follow-up in 4 months. Repeat echocardiogram was obtained in 05/2019 and showed a preserved EF of 55 to 60% with no regional motion abnormalities. She did have moderate LVH, trivial MR and moderate aortic stenosis with mean gradient at 21 mmHg.  In talking with the patient and her daughter today, she reports overall doing well from a cardiac perspective since her last visit. She denies any recent chest pain or dyspnea on exertion. No recent orthopnea, PND or lower extremity edema. No recent dizziness, presyncope or palpitations. She is now being followed by GI in [place] and was previously cleared by Cardiology to proceed with planned EGD and colonoscopy but this has not yet been scheduled.   Past Medical History:  Diagnosis Date  . Aortic stenosis   . Arthritis   . Asthma   . CKD (chronic kidney disease)   . Essential hypertension   . Mixed hyperlipidemia   . Symptomatic anemia    Presumed GI bleed January 2021 - she declined GI work-up  . Type 2 diabetes mellitus ([place])     Past Surgical History:   Procedure Laterality Date  . APPENDECTOMY    . CYST REMOVAL HAND     Right, hospital in [place] ARTHROSCOPY WITH MEDIAL MENISECTOMY Right 02/28/2012   Procedure: KNEE ARTHROSCOPY WITH MEDIAL MENISECTOMY;  Surgeon: Carole Civil, MD;  Location: [facility];  Service: Orthopedics;  Laterality: Right;  . REPAIR KNEE LIGAMENT      Current Medications: Outpatient Medications Prior to Visit  Medication Sig Dispense Refill  . albuterol (PROVENTIL) (2.5 MG/3ML) 0.083% nebulizer solution Take 2.5 mg by nebulization every 6 (six) hours as needed for wheezing or shortness of breath.   2  . atorvastatin (LIPITOR) 20 MG tablet Take 20 mg by mouth every evening.     Marland Kitchen FEROSUL 325 (65 Fe) MG tablet Take 325 mg by mouth daily.    . Insulin Detemir (LEVEMIR FLEXTOUCH) 100 UNIT/ML Pen Inject 30 Units into the skin at bedtime. 15 mL 11  . lisinopril (ZESTRIL) 20 MG tablet Take 20 mg by mouth daily.    . metFORMIN (GLUMETZA) 500 MG (MOD) 24 hr tablet Take 500 mg by mouth daily with breakfast.    . triamterene-hydrochlorothiazide (DYAZIDE) 37.5-25 MG capsule Take 1 capsule by mouth daily.    . Vitamin D, Ergocalciferol, (DRISDOL) 1.25 MG (50000 UNIT) CAPS capsule once a week.      No facility-administered medications prior to visit.     Allergies:   Amoxicillin, Penicillins, and Sulfa antibiotics  Social History   Socioeconomic History  . Marital status: Divorced    Spouse name: Not on file  . Number of children: Not on file  . Years of education: Not on file  . Highest education level: Not on file  Occupational History  . Not on file  Tobacco Use  . Smoking status: Never Smoker  . Smokeless tobacco: Never Used  Vaping Use  . Vaping Use: Never used  Substance and Sexual Activity  . Alcohol use: No  . Drug use: No  . Sexual activity: Never  Other Topics Concern  . Not on file  Social History Narrative   ** Merged History Encounter **       Social Determinants of Health   Financial  Resource Strain:   . Difficulty of Paying Living Expenses: Not on file  Food Insecurity:   . Worried About Charity fundraiser in the Last Year: Not on file  . Ran Out of Food in the Last Year: Not on file  Transportation Needs:   . Lack of Transportation (Medical): Not on file  . Lack of Transportation (Non-Medical): Not on file  Physical Activity:   . Days of Exercise per Week: Not on file  . Minutes of Exercise per Session: Not on file  Stress:   . Feeling of Stress : Not on file  Social Connections:   . Frequency of Communication with Friends and Family: Not on file  . Frequency of Social Gatherings with Friends and Family: Not on file  . Attends Religious Services: Not on file  . Active Member of Clubs or Organizations: Not on file  . Attends Archivist Meetings: Not on file  . Marital Status: Not on file     Family History:  The patient's family history includes Arthritis in an other family member; Asthma in an other family member; Cancer in an other family member; Diabetes in her brother, father, mother, and another family member; Diverticulitis in her mother; Hypertension in her mother and sister; Lung disease in an other family member.   Review of Systems:   Please see the history of present illness.     General:  No chills, fever, night sweats or weight changes.  Cardiovascular:  No chest pain, dyspnea on exertion, edema, orthopnea, palpitations, paroxysmal nocturnal dyspnea. Dermatological: No rash, lesions/masses Respiratory: No cough, dyspnea Urologic: No hematuria, dysuria Abdominal:   No nausea, vomiting, diarrhea, bright red blood per rectum, melena, or hematemesis Neurologic:  No visual changes, wkns, changes in mental status.  All other systems reviewed and are otherwise negative except as noted above.   Physical Exam:    VS:  BP 132/68   Pulse 72   Wt 203 lb 6.4 oz (92.3 kg)   SpO2 96%   BMI 33.85 kg/m    General: Well developed, elderly  female appearing in no acute distress. Head: Normocephalic, atraumatic. Wearing a mask.  Neck: No carotid bruits. JVD not elevated.  Lungs: Respirations regular and unlabored, without wheezes or rales.  Heart: Regular rate and rhythm. No S3 or S4.  No rubs or gallops appreciated. 3/6 SEM along RUSB.  Abdomen: Appears non-distended. No obvious abdominal masses. Msk:  Strength and tone appear normal for age. No obvious joint deformities or effusions. Extremities: No clubbing or cyanosis. No edema.  Distal pedal pulses are 2+ bilaterally. Neuro: Alert and oriented X 3. Moves all extremities spontaneously. No focal deficits noted. Psych:  Responds to questions appropriately with a normal affect. Skin:  No rashes or lesions noted  Wt Readings from Last 3 Encounters:  09/19/19 203 lb 6.4 oz (92.3 kg)  06/20/19 205 lb 9.6 oz (93.3 kg)  04/13/19 205 lb 9.6 oz (93.3 kg)     Studies/Labs Reviewed:   EKG:  EKG is not ordered today.   Recent Labs: 02/10/2019: ALT 10; BUN 15; Creatinine, Ser 1.20; Hemoglobin 8.6; Platelets 336; Potassium 4.8; Sodium 135   Lipid Panel No results found for: CHOL, TRIG, HDL, CHOLHDL, VLDL, LDLCALC, LDLDIRECT  Additional studies/ records that were reviewed today include:   Echocardiogram: 05/2019 IMPRESSIONS    1. Left ventricular ejection fraction, by estimation, is 55 to 60%. The  left ventricle has normal function. The left ventricle has no regional  wall motion abnormalities. There is moderate left ventricular hypertrophy.  Left ventricular diastolic  parameters are indeterminate.  2. Right ventricular systolic function is normal. The right ventricular  size is normal. Tricuspid regurgitation signal is inadequate for assessing  PA pressure.  3. Left atrial size was upper normal.  4. The mitral valve is degenerative with mild to moderate annular  calcification. Trivial mitral valve regurgitation.  5. The aortic valve is tricuspid, moderately  calcified. Aortic valve  regurgitation is mild. Moderate aortic valve stenosis. Aortic valve mean  gradient measures 21.0 mmHg. Aortic valve Vmax measures 2.98 m/s.  Dimentionless index 0.28.  6. The inferior vena cava is normal in size with greater than 50%  respiratory variability, suggesting right atrial pressure of 3 mmHg.   Assessment:    1. Nonrheumatic aortic valve stenosis   2. Essential hypertension   3. Mixed hyperlipidemia   4. Preoperative clearance      Plan:   In order of problems listed above:  1. Aortic Stenosis - She had moderate aortic stenosis by echocardiogram in 05/2019 with a mean gradient of 21.0 mmHg. She denies any recent dyspnea on exertion, dizziness, presyncope or palpitations. - We did review the likely progression of aortic stenosis and that she might ultimately require valve replacement with possible TAVR but this is not indicated currently given her moderate AS. I encouraged her to make [place] aware if she develops any progressive symptoms, otherwise will plan to obtain a repeat echocardiogram next May as this will be a year from her prior study.  2. HTN - BP was initially elevated at 146/68, improved to 132/68 on repeat check. She does not check her blood pressure at home. Continue current medication regimen for now with Lisinopril 20 mg daily and Triamterene-HCTZ 37.5-25 mg daily. Lisinopril could be further titrated in the future if BP is above goal.  3. HLD - Followed by PCP. She remains on Atorvastatin 20 mg daily.  4. Clearance for Colonoscopy and EGD - She had previously been cleared from a cardiac perspective to proceed with the above GI procedures as noted by prior documentation in 06/2019. She denies any recent anginal symptoms and given her moderate AS by recent echocardiogram, further testing is not indicated at this time prior to her procedures.   Medication Adjustments/Labs and Tests Ordered: Current medicines are reviewed at length with  the patient today.  Concerns regarding medicines are outlined above.  Medication changes, Labs and Tests ordered today are listed in the Patient Instructions below. Patient Instructions  Medication Instructions:  Your physician recommends that you continue on your current medications as directed. Please refer to the Current Medication list given to you today.  *If you need a refill on your cardiac medications before your  next appointment, please call your pharmacy*   Lab Work: NONE   If you have labs (blood work) drawn today and your tests are completely normal, you will receive your results only by: Marland Kitchen MyChart Message (if you have MyChart) OR . A paper copy in the mail If you have any lab test that is abnormal or we need to change your treatment, we will call you to review the results.   Testing/Procedures: NONE    Follow-Up: At [facility], you and your health needs are our priority.  As part of our continuing mission to provide you with exceptional heart care, we have created designated Provider Care Teams.  These Care Teams include your primary Cardiologist (physician) and Advanced Practice Providers (APPs -  Physician Assistants and Nurse Practitioners) who all work together to provide you with the care you need, when you need it.  We recommend signing up for the patient portal called "MyChart".  Sign up information is provided on this After Visit Summary.  MyChart is used to connect with patients for Virtual Visits (Telemedicine).  Patients are able to view lab/test results, encounter notes, upcoming appointments, etc.  Non-urgent messages can be sent to your provider as well.   To learn more about what you can do with MyChart, go to [email or web address].    Your next appointment:   6 month(s)  The format for your next appointment:   In Person  Provider:   Rozann Lesches, MD or Bernerd Pho, PA-C   Other Instructions Thank you for choosing [facility]!    Signed, Erma Heritage, PA-C  09/19/2019 5:00 PM    [place] [address] [place], [place] [address] Phone: [phone] Fax: [phone]

## 2019-10-09 ENCOUNTER — Telehealth: Payer: Self-pay | Admitting: Cardiology

## 2019-10-09 NOTE — Telephone Encounter (Signed)
Dr.Myers requesting to speak with a Nurse re:Pt's echo reading over the phone   Please call [phone]   thanks renee (I have informed Dr.Myers a faxed requested is needed, however she would rather speak with a nurse)

## 2019-10-10 NOTE — Telephone Encounter (Signed)
Contacted Dr. Doyle Askew to assist with request and she says that she no longer needed the valve readings.  Expressed sincere apologies for delayed response.

## 2020-01-28 NOTE — Progress Notes (Signed)
Cardiology Office Note    Date:  01/29/2020   ID:  Stephanie [name], DOB June 26, 1935, MRN [id number]  PCP:  Stephanie Richard, MD  Cardiologist: Stephanie Lesches, MD EPS: None  Chief Complaint  Patient presents with  . Follow-up    History of Present Illness:  Stephanie [name] is a 85 y.o. female  with past medical history of HTN, HLD, IDDM, anemia and mod-severe aortic stenosis mean gradient 19.5 on echo 01/2019.Repeat echocardiogram was obtained in 05/2019 and showed a preserved EF of 55 to 60% with no regional motion abnormalities. She did have moderate LVH, trivial MR and moderate aortic stenosis with mean gradient at 21 mmHg.    Last seen by Bernerd Pho, PA-C 09/19/19 and doing well.  Patient added onto my schedule because of recent CVA-hospitalized [name]. She went to [name] a week before Christmas for chest tightness. Daughter and patient aren't sure if they told her she had a heart attack or stroke. Kept her for 24 hrs and tests were "ok". They aren't clear on what tests were done. 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 Goes away immediately with rest. Some dyspnea but no dizziness or syncope or edema. Occurs about every other day. BP up today, usually ok when she goes to the doctor.     Past Medical History:  Diagnosis Date  . Aortic stenosis   . Arthritis   . Asthma   . CKD (chronic kidney disease)   . Essential hypertension   . Mixed hyperlipidemia   . Symptomatic anemia    Presumed GI bleed January 2021 - she declined GI work-up  . Type 2 diabetes mellitus (Stephanie [name])     Past Surgical History:  Procedure Laterality Date  . APPENDECTOMY    . CYST REMOVAL HAND     Right, hospital in [place] ARTHROSCOPY WITH MEDIAL MENISECTOMY Right 02/28/2012   Procedure: KNEE ARTHROSCOPY WITH MEDIAL MENISECTOMY;  Surgeon: Stephanie Civil, MD;  Location: [facility];  Service: Orthopedics;  Laterality: Right;  . REPAIR KNEE LIGAMENT       Current Medications: Current Meds  Medication Sig  . albuterol (PROVENTIL) (2.5 MG/3ML) 0.083% nebulizer solution Take 2.5 mg by nebulization every 6 (six) hours as needed for wheezing or shortness of breath.   Marland Kitchen amLODipine (NORVASC) 5 MG tablet Take 5 mg by mouth daily.  Marland Kitchen atorvastatin (LIPITOR) 20 MG tablet Take 20 mg by mouth every evening.   . Cyanocobalamin (VITAMIN B 12) 500 MCG TABS Take 1 tablet by mouth daily.  . FEROSUL 325 (65 Fe) MG tablet Take 325 mg by mouth daily.  Marland Kitchen gabapentin (NEURONTIN) 100 MG capsule Take 1-2 capsules by mouth at bedtime.  . Insulin Detemir (LEVEMIR FLEXTOUCH) 100 UNIT/ML Pen Inject 30 Units into the skin at bedtime.  Marland Kitchen lisinopril (ZESTRIL) 20 MG tablet Take 20 mg by mouth daily.  . metFORMIN (GLUMETZA) 500 MG (MOD) 24 hr tablet Take 500 mg by mouth daily with breakfast.     Allergies:   Amoxicillin, Penicillins, and Sulfa antibiotics   Social History   Socioeconomic History  . Marital status: Divorced    Spouse name: Not on file  . Number of children: Not on file  . Years of education: Not on file  . Highest education level: Not on file  Occupational History  . Not on file  Tobacco Use  . Smoking status: Never Smoker  . Smokeless tobacco: Never Used  Vaping Use  .  Vaping Use: Never used  Substance and Sexual Activity  . Alcohol use: No  . Drug use: No  . Sexual activity: Never  Other Topics Concern  . Not on file  Social History Narrative   ** Merged History Encounter **       Social Determinants of Health   Financial Resource Strain: Not on file  Food Insecurity: Not on file  Transportation Needs: Not on file  Physical Activity: Not on file  Stress: Not on file  Social Connections: Not on file     Family History:  The patient's family history includes Arthritis in an other family member; Asthma in an other family member; Cancer in an other family member; Diabetes in her brother, father, mother, and another family member;  Diverticulitis in her mother; Hypertension in her mother and sister; Lung disease in an other family member.   ROS:   Please see the history of present illness.    ROS All other systems reviewed and are negative.   PHYSICAL EXAM:   VS:  BP (!) 158/68   Pulse 80   Resp 16   Ht 5\' 5"  (1.651 m)   Wt 205 lb (93 kg)   SpO2 99%   BMI 34.11 kg/m   Physical Exam  GEN: Obese, in no acute distress  Neck: murmur portrayed in carotids no JVD, carotid bruits, or masses Cardiac:RRR; 4/6 systolic murmur LSB, decreased S2 Respiratory:  clear to auscultation bilaterally, normal work of breathing GI: soft, nontender, nondistended, + BS Ext: without cyanosis, clubbing, or edema, Good distal pulses bilaterally Neuro:  Alert and Oriented x 3 Psych: euthymic mood, full affect  Wt Readings from Last 3 Encounters:  01/29/20 205 lb (93 kg)  09/19/19 203 lb 6.4 oz (92.3 kg)  06/20/19 205 lb 9.6 oz (93.3 kg)      Studies/Labs Reviewed:   EKG:  EKG is not ordered today.  Recent Labs: 02/10/2019: ALT 10; BUN 15; Creatinine, Ser 1.20; Hemoglobin 8.6; Platelets 336; Potassium 4.8; Sodium 135   Lipid Panel No results found for: CHOL, TRIG, HDL, CHOLHDL, VLDL, LDLCALC, LDLDIRECT  Additional studies/ records that were reviewed today include:    Echocardiogram: 05/2019 IMPRESSIONS     1. Left ventricular ejection fraction, by estimation, is 55 to 60%. The  left ventricle has normal function. The left ventricle has no regional  wall motion abnormalities. There is moderate left ventricular hypertrophy.  Left ventricular diastolic  parameters are indeterminate.   2. Right ventricular systolic function is normal. The right ventricular  size is normal. Tricuspid regurgitation signal is inadequate for assessing  PA pressure.   3. Left atrial size was upper normal.   4. The mitral valve is degenerative with mild to moderate annular  calcification. Trivial mitral valve regurgitation.   5. The aortic  valve is tricuspid, moderately calcified. Aortic valve  regurgitation is mild. Moderate aortic valve stenosis. Aortic valve mean  gradient measures 21.0 mmHg. Aortic valve Vmax measures 2.98 m/s.  Dimentionless index 0.28.   6. The inferior vena cava is normal in size with greater than 50%  respiratory variability, suggesting right atrial pressure of 3 mmHg.      Risk Assessment/Calculations:         ASSESSMENT:    1. Nonrheumatic aortic valve stenosis   2. Essential hypertension   3. Hyperlipidemia, unspecified hyperlipidemia type      PLAN:  In order of problems listed above:  Aortic stenosis moderate AS on echo 05/2019, normal LVEF, mean  gradient 21 mmHg. Recently hospitalized [facility] in December with chest pain. Family unclear with what took place or diagnosis-at first they said stroke but then they say mild heart attack. Trying to obtain records. Now having regular chest tightness with little activity. Refer to TAVR clinic for further work up.  Addendum: Records obtained from [facility] hospitalization 01/04/20-01/05/20 and reviewed. 3 days of chest pain, Hypertensive urgency-amlodipine added, CT negative PE, , NST fixed defects without ischemia EF 29% by computer but appeared better, echo LVEF 55% grade 1DD, moderated Aortic stenosis mean gradient 26 mmHg,  HS troponin flat peak 65, records to be scanned into patient's chart.  HTN BP up today but patient says it's usually ok when she goes to the Dr. Aletha Halim not make any changes today until I review her hospital records.  HLD on atorvastatin.  Shared Decision Making/Informed Consent        Medication Adjustments/Labs and Tests Ordered: Current medicines are reviewed at length with the patient today.  Concerns regarding medicines are outlined above.  Medication changes, Labs and Tests ordered today are listed in the Patient Instructions below. Patient Instructions  Medication Instructions:  Your physician recommends  that you continue on your current medications as directed. Please refer to the Current Medication list given to you today.  *If you need a refill on your cardiac medications before your next appointment, please call your pharmacy*   Lab Work: NONE   If you have labs (blood work) drawn today and your tests are completely normal, you will receive your results only by: Marland Kitchen MyChart Message (if you have MyChart) OR . A paper copy in the mail If you have any lab test that is abnormal or we need to change your treatment, we will call you to review the results.   Testing/Procedures: NONE    Follow-Up: At [facility], you and your health needs are our priority.  As part of our continuing mission to provide you with exceptional heart care, we have created designated Provider Care Teams.  These Care Teams include your primary Cardiologist (physician) and Advanced Practice Providers (APPs -  Physician Assistants and Nurse Practitioners) who all work together to provide you with the care you need, when you need it.  We recommend signing up for the patient portal called "MyChart".  Sign up information is provided on this After Visit Summary.  MyChart is used to connect with patients for Virtual Visits (Telemedicine).  Patients are able to view lab/test results, encounter notes, upcoming appointments, etc.  Non-urgent messages can be sent to your provider as well.   To learn more about what you can do with MyChart, go to [email or web address].    Your next appointment:    To Be Determined   The format for your next appointment:   In Person  Provider:   Ermalinda Barrios, PA-C   Other Instructions Thank you for choosing [facility]!       Sumner Boast, PA-C  01/29/2020 12:01 PM    [facility] [address], [place], [place]  [address] Phone: [phone]; Fax: [phone]

## 2020-01-29 ENCOUNTER — Encounter: Payer: Self-pay | Admitting: *Deleted

## 2020-01-29 ENCOUNTER — Ambulatory Visit: Payer: Medicare HMO | Admitting: Physician Assistant

## 2020-01-29 ENCOUNTER — Other Ambulatory Visit: Payer: Self-pay

## 2020-01-29 ENCOUNTER — Other Ambulatory Visit: Payer: Self-pay | Admitting: Physician Assistant

## 2020-01-29 ENCOUNTER — Encounter: Payer: Self-pay | Admitting: Physician Assistant

## 2020-01-29 VITALS — BP 158/68 | HR 80 | Resp 16 | Ht 65.0 in | Wt 205.0 lb

## 2020-01-29 DIAGNOSIS — I350 Nonrheumatic aortic (valve) stenosis: Secondary | ICD-10-CM | POA: Diagnosis not present

## 2020-01-29 DIAGNOSIS — E785 Hyperlipidemia, unspecified: Secondary | ICD-10-CM | POA: Diagnosis not present

## 2020-01-29 DIAGNOSIS — I10 Essential (primary) hypertension: Secondary | ICD-10-CM

## 2020-01-29 NOTE — Patient Instructions (Addendum)
Medication Instructions:  Your physician recommends that you continue on your current medications as directed. Please refer to the Current Medication list given to you today.  *If you need a refill on your cardiac medications before your next appointment, please call your pharmacy*   Lab Work: NONE   If you have labs (blood work) drawn today and your tests are completely normal, you will receive your results only by: Marland Kitchen MyChart Message (if you have MyChart) OR . A paper copy in the mail If you have any lab test that is abnormal or we need to change your treatment, we will call you to review the results.   Testing/Procedures: NONE    Follow-Up: At [facility], you and your health needs are our priority.  As part of our continuing mission to provide you with exceptional heart care, we have created designated Provider Care Teams.  These Care Teams include your primary Cardiologist (physician) and Advanced Practice Providers (APPs -  Physician Assistants and Nurse Practitioners) who all work together to provide you with the care you need, when you need it.  We recommend signing up for the patient portal called "MyChart".  Sign up information is provided on this After Visit Summary.  MyChart is used to connect with patients for Virtual Visits (Telemedicine).  Patients are able to view lab/test results, encounter notes, upcoming appointments, etc.  Non-urgent messages can be sent to your provider as well.   To learn more about what you can do with MyChart, go to [email or web address].    Your next appointment:    To Be Determined   The format for your next appointment:   In Person  Provider:   Ermalinda Barrios, PA-C   Other Instructions Thank you for choosing [facility]!

## 2020-02-01 ENCOUNTER — Encounter: Payer: Self-pay | Admitting: Physician Assistant

## 2020-02-22 ENCOUNTER — Encounter: Payer: Self-pay | Admitting: Cardiovascular Disease

## 2020-02-22 ENCOUNTER — Other Ambulatory Visit: Payer: Self-pay

## 2020-02-22 ENCOUNTER — Other Ambulatory Visit (HOSPITAL_COMMUNITY): Payer: Medicare HMO

## 2020-02-22 ENCOUNTER — Ambulatory Visit (HOSPITAL_COMMUNITY): Payer: Medicare HMO | Attending: Internal Medicine

## 2020-02-22 ENCOUNTER — Ambulatory Visit: Payer: Medicare HMO | Admitting: Cardiovascular Disease

## 2020-02-22 VITALS — BP 148/80 | HR 78 | Ht 65.0 in | Wt 201.6 lb

## 2020-02-22 DIAGNOSIS — I350 Nonrheumatic aortic (valve) stenosis: Secondary | ICD-10-CM

## 2020-02-22 LAB — ECHOCARDIOGRAM COMPLETE
AR max vel: 0.97 cm2
AV Area VTI: 0.89 cm2
AV Area mean vel: 0.85 cm2
AV Mean grad: 22.7 mmHg
AV Peak grad: 35.5 mmHg
Ao pk vel: 2.98 m/s
P 1/2 time: 416 msec
S' Lateral: 3.3 cm

## 2020-02-22 NOTE — Patient Instructions (Signed)
Medication Instructions:  No changes *If you need a refill on your cardiac medications before your next appointment, please call your pharmacy*   Lab Work: none If you have labs (blood work) drawn today and your tests are completely normal, you will receive your results only by: Marland Kitchen MyChart Message (if you have MyChart) OR . A paper copy in the mail If you have any lab test that is abnormal or we need to change your treatment, we will call you to review the results.   Testing/Procedures:   ECHO DUE IN AUGUST IN [facility] Your physician has requested that you have an echocardiogram. Echocardiography is a painless test that uses sound waves to create images of your heart. It provides your doctor with information about the size and shape of your heart and how well your heart's chambers and valves are working. This procedure takes approximately one hour. There are no restrictions for this procedure.   Follow-Up:  At [facility], you and your health needs are our priority.  As part of our continuing mission to provide you with exceptional heart care, we have created designated Provider Care Teams.  These Care Teams include your primary Cardiologist (physician) and Advanced Practice Providers (APPs -  Physician Assistants and Nurse Practitioners) who all work together to provide you with the care you need, when you need it.  We recommend signing up for the patient portal called "MyChart".  Sign up information is provided on this After Visit Summary.  MyChart is used to connect with patients for Virtual Visits (Telemedicine).  Patients are able to view lab/test results, encounter notes, upcoming appointments, etc.  Non-urgent messages can be sent to your provider as well.   To learn more about what you can do with MyChart, go to [email or web address].    Your next appointment:   6 month(s)  The format for your next appointment:   In Person  Provider:   You may see Rozann Lesches, MD or  one of the following Advanced Practice Providers on your designated Care Team:    Bernerd Pho, PA-C   Ermalinda Barrios, PA-C     Other Instructions Follow up with Dr. Angelena Form in one year.

## 2020-02-22 NOTE — Progress Notes (Signed)
[facility] Consult Note  Chief Complaint  Patient presents with  . Follow-up    Aortic stenosis    History of Present Illness: 86 yo female with history of anemia, arthritis, asthma, chronic kidney disease, HTN, hyperlipidemia, diabetes mellitus and aortic stenosis who is here today as a new consult, referred by Domenic Polite, for further discussion regarding her aortic stenosis and possible TAVR. She has been followed in our [facility] office by Dr. Domenic Polite for moderate aortic stenosis. Most recent echo in May 2021 showed normal LV systolic function with VHQI=69-62%, moderate LVH. There is moderately severe aortic stenosis with mean gradient 21 mmHg, peak gradient 35.6 mmHg, AVA 0.80cm2, dimensionless index 0.28. She was admitted to a hospital in [place], [place] in December 2021 with chest pain. Her workup was negative per pt. Her blood pressure was elevated and she was started on Norvasc. CTA chest negative for PE. No ischemia on stress test. Echo with reported normal LV function with moderate AS. Echo today in our office is reviewed by me. Normal LV systolic function. The aortic visually appears to have moderate stenosis. Mean gradient 23 mmHg, peak gradient 40 mmHg, AVA 0.85 cm2, dimensionless index 0.26. This is consistent with moderate aortic stenosis.   She tells me today that she is feeling well overall. She has rare chest pains that last for a few seconds. She has no dyspnea or dizziness. She has no LE edema. She ambulates with a cane. She lives in [place], [place]. She is retired as a Training and development officer.    Primary Care Physician: Abran Richard, MD Primary Cardiologist: Domenic Polite Referring Cardiologist: Domenic Polite  Past Medical History:  Diagnosis Date  . Aortic stenosis   . Arthritis   . Asthma   . CKD (chronic kidney disease)   . Essential hypertension   . Mixed hyperlipidemia   . Symptomatic anemia    Presumed GI bleed January 2021 - she declined GI work-up  .  Type 2 diabetes mellitus ([place])     Past Surgical History:  Procedure Laterality Date  . APPENDECTOMY    . BACK SURGERY    . CYST REMOVAL HAND     Right, hospital in [place] ARTHROSCOPY WITH MEDIAL MENISECTOMY Right 02/28/2012   Procedure: KNEE ARTHROSCOPY WITH MEDIAL MENISECTOMY;  Surgeon: Carole Civil, MD;  Location: [facility];  Service: Orthopedics;  Laterality: Right;  . REPAIR KNEE LIGAMENT      Current Outpatient Medications  Medication Sig Dispense Refill  . albuterol (PROVENTIL) (2.5 MG/3ML) 0.083% nebulizer solution Take 2.5 mg by nebulization every 6 (six) hours as needed for wheezing or shortness of breath.   2  . amLODipine (NORVASC) 5 MG tablet Take 5 mg by mouth daily.    Marland Kitchen atorvastatin (LIPITOR) 20 MG tablet Take 20 mg by mouth every evening.     . Cyanocobalamin (VITAMIN B 12) 500 MCG TABS Take 1 tablet by mouth daily.    . FEROSUL 325 (65 Fe) MG tablet Take 325 mg by mouth daily.    Marland Kitchen gabapentin (NEURONTIN) 100 MG capsule Take 1-2 capsules by mouth at bedtime.    . Insulin Detemir (LEVEMIR FLEXTOUCH) 100 UNIT/ML Pen Inject 30 Units into the skin at bedtime. 15 mL 11  . lisinopril (ZESTRIL) 20 MG tablet Take 20 mg by mouth daily.    . metFORMIN (GLUMETZA) 500 MG (MOD) 24 hr tablet Take 500 mg by mouth daily with breakfast.    . triamterene-hydrochlorothiazide (DYAZIDE) 37.5-25 MG capsule Take  1 capsule by mouth daily.     No current facility-administered medications for this visit.    Allergies  Allergen Reactions  . Amoxicillin Swelling    Tongue swelling   . Penicillins Swelling    Has patient had a PCN reaction causing immediate rash, facial/tongue/throat swelling, SOB or lightheadedness with hypotension: No Has patient had a PCN reaction causing severe rash involving mucus membranes or skin necrosis: No Has patient had a PCN reaction that required hospitalization: No Has patient had a PCN reaction occurring within the last 10 years: No If all of the  above answers are "NO", then may proceed with Cephalosporin use.   . Sulfa Antibiotics Swelling    Lip swelling    Social History   Socioeconomic History  . Marital status: Divorced    Spouse name: Not on file  . Number of children: 3  . Years of education: Not on file  . Highest education level: Not on file  Occupational History  . Occupation: Retired-cook   Tobacco Use  . Smoking status: Never Smoker  . Smokeless tobacco: Never Used  Vaping Use  . Vaping Use: Never used  Substance and Sexual Activity  . Alcohol use: No  . Drug use: No  . Sexual activity: Never  Other Topics Concern  . Not on file  Social History Narrative   ** Merged History Encounter **       Social Determinants of Health   Financial Resource Strain: Not on file  Food Insecurity: Not on file  Transportation Needs: Not on file  Physical Activity: Not on file  Stress: Not on file  Social Connections: Not on file  Intimate Partner Violence: Not on file    Family History  Problem Relation Age of Onset  . Lung disease Other   . Cancer Other   . Arthritis Other   . Asthma Other   . Diabetes Other   . Diabetes Mother   . Hypertension Mother   . Diverticulitis Mother   . Diabetes Father   . Hypertension Sister   . Diabetes Brother   . Colon cancer Neg Hx     Review of Systems:  As stated in the HPI and otherwise negative.   BP (!) 148/80   Pulse 78   Ht 5\' 5"  (1.651 m)   Wt 201 lb 9.6 oz (91.4 kg)   SpO2 96%   BMI 33.55 kg/m   Physical Examination: General: Well developed, well nourished, NAD  HEENT: OP clear, mucus membranes moist  SKIN: warm, dry. No rashes. Neuro: No focal deficits  Musculoskeletal: Muscle strength 5/5 all ext  Psychiatric: Mood and affect normal  Neck: No JVD, no carotid bruits, no thyromegaly, no lymphadenopathy.  Lungs:Clear bilaterally, no wheezes, rhonci, crackles Cardiovascular: Regular rate and rhythm. Loud, harsh, late peaking systolic murmur.   Abdomen:Soft. Bowel sounds present. Non-tender.  Extremities: No lower extremity edema. Pulses are 2 + in the bilateral DP/PT.  EKG:  EKG is ordered today. The ekg ordered today demonstrates sinus, PACs  Echo February 4,2022:  1. Left ventricular ejection fraction, by estimation, is 55 to 60%. The  left ventricle has normal function. The left ventricle has no regional  wall motion abnormalities. There is moderate concentric left ventricular  hypertrophy of the basal-septal  segment. Left ventricular diastolic parameters are indeterminate.  2. Right ventricular systolic function is hyperdynamic. The right  ventricular size is normal.  3. A small pericardial effusion is present. Pericardial fat pad is  prominent.  4. The mitral valve is myxomatous. Trivial mitral valve regurgitation.  Mild to moderate mitral stenosis. The mean mitral valve gradient is, on  average 4.8 mmHg (assessed with four beats) with average heart rate of 80  bpm. Moderate mitral annular  calcification.  5. The aortic valve was not well visualized. There is moderate  calcification of the aortic valve. There is moderate thickening of the  aortic valve. Aortic valve regurgitation is mild. Mild to moderate aortic  valve stenosis. Aortic valve area, by VTI  measures 0.89 cm. Aortic valve mean gradient measures 22.7 mmHg. Aortic  valve Vmax measures 2.98 m/s. Diminished left ventricular stroke volume  index < 35.   Comparison(s): A prior study was performed on 06/12/19. Prior images  reviewed side by side. Similar aortic parameters from prior. Differential  includes paradoxical low flow low gradient aortic stenosis; consider low  dose dobutamine stress echo if  clinically indicated.   FINDINGS  Left Ventricle: Left ventricular ejection fraction, by estimation, is 55  to 60%. The left ventricle has normal function. The left ventricle has no  regional wall motion abnormalities. The left ventricular internal  cavity  size was normal in size. There is  moderate concentric left ventricular hypertrophy of the basal-septal  segment. Left ventricular diastolic parameters are indeterminate.   Right Ventricle: The right ventricular size is normal. No increase in  right ventricular wall thickness. Right ventricular systolic function is  hyperdynamic.   Left Atrium: Left atrial size was normal in size.   Right Atrium: Right atrial size was normal in size.   Pericardium: A small pericardial effusion is present. Presence of  pericardial fat pad.   Mitral Valve: The mitral valve is myxomatous. There is mild calcification  of the mitral valve leaflet(s). Moderate mitral annular calcification.  Trivial mitral valve regurgitation. Mild to moderate mitral valve  stenosis. The mean mitral valve gradient is  4.8 mmHg with average heart rate of 80 bpm.   Tricuspid Valve: The tricuspid valve is grossly normal. Tricuspid valve  regurgitation is not demonstrated.   Aortic Valve: Diminished left ventricular stroke volume index < 35. The  aortic valve was not well visualized. There is moderate calcification of  the aortic valve. There is moderate thickening of the aortic valve. Aortic  valve regurgitation is mild.  Aortic regurgitation PHT measures 416 msec. Mild to moderate aortic  stenosis is present. Aortic valve mean gradient measures 22.7 mmHg. Aortic  valve peak gradient measures 35.5 mmHg. Aortic valve area, by VTI measures  0.89 cm.   Pulmonic Valve: The pulmonic valve was not well visualized. Pulmonic valve  regurgitation is not visualized. No evidence of pulmonic stenosis.   Aorta: The aortic root and ascending aorta are structurally normal, with  no evidence of dilitation.   Pulmonary Artery: The pulmonary artery is of normal size.   IAS/Shunts: The atrial septum is grossly normal.     LEFT VENTRICLE  PLAX 2D  LVIDd:     4.70 cm  LVIDs:     3.30 cm  LV PW:     1.50 cm   LV IVS:    1.50 cm  LVOT diam:   2.10 cm  LV SV:     56  LV SV Index:  28  LVOT Area:   3.46 cm     RIGHT VENTRICLE  RV Basal diam: 3.30 cm  RV S prime:   15.80 cm/s  TAPSE (M-mode): 2.3 cm   LEFT ATRIUM  Index    RIGHT ATRIUM      Index  LA diam:    5.20 cm 2.60 cm/m RA Pressure: 3.00 mmHg  LA Vol (A2C):  45.4 ml 22.71 ml/m RA Area:   15.60 cm  LA Vol (A4C):  41.1 ml 20.56 ml/m RA Volume:  41.10 ml 20.56 ml/m  LA Biplane Vol: 44.6 ml 22.31 ml/m  AORTIC VALVE  AV Area (Vmax):  0.97 cm  AV Area (Vmean):  0.85 cm  AV Area (VTI):   0.89 cm  AV Vmax:      298.00 cm/s  AV Vmean:     217.800 cm/s  AV VTI:      0.626 m  AV Peak Grad:   35.5 mmHg  AV Mean Grad:   22.7 mmHg  LVOT Vmax:     83.16 cm/s  LVOT Vmean:    53.520 cm/s  LVOT VTI:     0.161 m  LVOT/AV VTI ratio: 0.26  AI PHT:      416 msec    AORTA  Ao Root diam: 3.30 cm  Ao Asc diam: 3.50 cm   MITRAL VALVE      TRICUSPID VALVE  MV Mean grad: 4.8 mmHg Estimated RAP: 3.00 mmHg               SHUNTS             Systemic VTI: 0.16 m             Systemic Diam: 2.10 cm   Recent Labs: No results found for requested labs within last 8760 hours.    Wt Readings from Last 3 Encounters:  02/22/20 201 lb 9.6 oz (91.4 kg)  01/29/20 205 lb (93 kg)  09/19/19 203 lb 6.4 oz (92.3 kg)     Other studies Reviewed: Additional studies/ records that were reviewed today include: echo images, office notes.  Review of the above records demonstrates: Moderate AS   Assessment and Plan:   1. Severe Aortic Valve Stenosis: She has moderate aortic stenosis by echo in May 2021 with no big changes on the echo today. I have personally reviewed the echo from today. The valve appears to open. The valve visually looks to have moderate aortic stenosis. The gradients support moderate AS with AVA and  dimensionless index suggesting moderately severe AS. She is overall asymptomatic with rare chest pains which do not sound cardiac. At this time, I think it is reasonable to continue to follow her aortic stenosis with serial echocardiograms. We will arrange a repeat echo in 6 months. I will have her see Dr. Domenic Polite after that echo and I will see her back in the valve clinic in one year.  When her valve progresses to a severe range, she should be a reasonable candidate for TAVR.   I have reviewed the natural history of aortic stenosis with the patient and their family members  who are present today. We have discussed the limitations of medical therapy and the poor prognosis associated with symptomatic aortic stenosis. We have reviewed potential treatment options, including palliative medical therapy, conventional surgical aortic valve replacement, and transcatheter aortic valve replacement. We discussed treatment options in the context of the patient's specific comorbid medical conditions.       Current medicines are reviewed at length with the patient today.  The patient does not have concerns regarding medicines.  The following changes have been made:  no change  Labs/ tests ordered today include:   Orders Placed This  Encounter  Procedures  . EKG 12-Lead  . ECHOCARDIOGRAM COMPLETE     Disposition:   FU with me in one year. Echo in six months with 6 month f/u with Dr. Domenic Polite.    Signed, Lauree Chandler, MD 02/22/2020 1:30 PM    [facility] [place], [place],   [address] Phone: [phone]; Fax: [phone]

## 2020-03-27 ENCOUNTER — Ambulatory Visit: Payer: Medicare HMO | Admitting: Cardiology

## 2020-04-08 ENCOUNTER — Other Ambulatory Visit: Payer: Self-pay

## 2020-04-08 ENCOUNTER — Emergency Department (HOSPITAL_COMMUNITY): Payer: Medicare HMO

## 2020-04-08 ENCOUNTER — Observation Stay (HOSPITAL_COMMUNITY)
Admission: EM | Admit: 2020-04-08 | Discharge: 2020-04-11 | Disposition: A | Discharge status: Home / Self Care | Payer: Medicare HMO | Attending: Cardiology | Admitting: Cardiology

## 2020-04-08 ENCOUNTER — Encounter (HOSPITAL_COMMUNITY): Payer: Self-pay | Admitting: Emergency Medicine

## 2020-04-08 DIAGNOSIS — I130 Hypertensive heart and chronic kidney disease with heart failure and stage 1 through stage 4 chronic kidney disease, or unspecified chronic kidney disease: Secondary | ICD-10-CM | POA: Insufficient documentation

## 2020-04-08 DIAGNOSIS — Z7951 Long term (current) use of inhaled steroids: Secondary | ICD-10-CM | POA: Insufficient documentation

## 2020-04-08 DIAGNOSIS — I5033 Acute on chronic diastolic (congestive) heart failure: Secondary | ICD-10-CM | POA: Insufficient documentation

## 2020-04-08 DIAGNOSIS — E119 Type 2 diabetes mellitus without complications: Secondary | ICD-10-CM

## 2020-04-08 DIAGNOSIS — E785 Hyperlipidemia, unspecified: Secondary | ICD-10-CM | POA: Diagnosis not present

## 2020-04-08 DIAGNOSIS — J45909 Unspecified asthma, uncomplicated: Secondary | ICD-10-CM | POA: Diagnosis present

## 2020-04-08 DIAGNOSIS — I350 Nonrheumatic aortic (valve) stenosis: Principal | ICD-10-CM

## 2020-04-08 DIAGNOSIS — Z20822 Contact with and (suspected) exposure to covid-19: Secondary | ICD-10-CM | POA: Insufficient documentation

## 2020-04-08 DIAGNOSIS — E1169 Type 2 diabetes mellitus with other specified complication: Secondary | ICD-10-CM

## 2020-04-08 DIAGNOSIS — R0609 Other forms of dyspnea: Secondary | ICD-10-CM | POA: Diagnosis present

## 2020-04-08 DIAGNOSIS — R0600 Dyspnea, unspecified: Secondary | ICD-10-CM | POA: Diagnosis present

## 2020-04-08 DIAGNOSIS — J45901 Unspecified asthma with (acute) exacerbation: Secondary | ICD-10-CM | POA: Insufficient documentation

## 2020-04-08 DIAGNOSIS — R0602 Shortness of breath: Secondary | ICD-10-CM | POA: Diagnosis present

## 2020-04-08 DIAGNOSIS — I10 Essential (primary) hypertension: Secondary | ICD-10-CM | POA: Diagnosis present

## 2020-04-08 DIAGNOSIS — J90 Pleural effusion, not elsewhere classified: Secondary | ICD-10-CM

## 2020-04-08 DIAGNOSIS — Z79899 Other long term (current) drug therapy: Secondary | ICD-10-CM | POA: Diagnosis not present

## 2020-04-08 DIAGNOSIS — Z23 Encounter for immunization: Secondary | ICD-10-CM | POA: Diagnosis not present

## 2020-04-08 DIAGNOSIS — R0902 Hypoxemia: Secondary | ICD-10-CM

## 2020-04-08 DIAGNOSIS — N189 Chronic kidney disease, unspecified: Secondary | ICD-10-CM | POA: Insufficient documentation

## 2020-04-08 DIAGNOSIS — Z794 Long term (current) use of insulin: Secondary | ICD-10-CM | POA: Diagnosis not present

## 2020-04-08 DIAGNOSIS — J441 Chronic obstructive pulmonary disease with (acute) exacerbation: Secondary | ICD-10-CM

## 2020-04-08 DIAGNOSIS — M7989 Other specified soft tissue disorders: Secondary | ICD-10-CM

## 2020-04-08 LAB — CBC WITH DIFFERENTIAL/PLATELET
Abs Immature Granulocytes: 0.03 10*3/uL (ref 0.00–0.07)
Basophils Absolute: 0 10*3/uL (ref 0.0–0.1)
Basophils Relative: 0 %
Eosinophils Absolute: 0.2 10*3/uL (ref 0.0–0.5)
Eosinophils Relative: 2 %
HCT: 39.4 % (ref 36.0–46.0)
Hemoglobin: 12.4 g/dL (ref 12.0–15.0)
Immature Granulocytes: 0 %
Lymphocytes Relative: 11 %
Lymphs Abs: 1.2 10*3/uL (ref 0.7–4.0)
MCH: 27.4 pg (ref 26.0–34.0)
MCHC: 31.5 g/dL (ref 30.0–36.0)
MCV: 87 fL (ref 80.0–100.0)
Monocytes Absolute: 0.8 10*3/uL (ref 0.1–1.0)
Monocytes Relative: 7 %
Neutro Abs: 8.9 10*3/uL — ABNORMAL HIGH (ref 1.7–7.7)
Neutrophils Relative %: 80 %
Platelets: 208 10*3/uL (ref 150–400)
RBC: 4.53 MIL/uL (ref 3.87–5.11)
RDW: 15.4 % (ref 11.5–15.5)
WBC: 11.1 10*3/uL — ABNORMAL HIGH (ref 4.0–10.5)
nRBC: 0 % (ref 0.0–0.2)

## 2020-04-08 LAB — TROPONIN I (HIGH SENSITIVITY)
Troponin I (High Sensitivity): 36 ng/L — ABNORMAL HIGH (ref <18)
Troponin I (High Sensitivity): 52 ng/L — ABNORMAL HIGH (ref <18)
Troponin I (High Sensitivity): 45 ng/L — ABNORMAL HIGH (ref <18)

## 2020-04-08 LAB — BASIC METABOLIC PANEL
Anion gap: 9 (ref 5–15)
BUN: 17 mg/dL (ref 8–23)
CO2: 24 mmol/L (ref 22–32)
Calcium: 10.1 mg/dL (ref 8.9–10.3)
Chloride: 104 mmol/L (ref 98–111)
Creatinine, Ser: 1 mg/dL (ref 0.44–1.00)
GFR, Estimated: 56 mL/min — ABNORMAL LOW (ref >60)
Glucose, Bld: 165 mg/dL — ABNORMAL HIGH (ref 70–99)
Potassium: 4.8 mmol/L (ref 3.5–5.1)
Sodium: 137 mmol/L (ref 135–145)

## 2020-04-08 LAB — BRAIN NATRIURETIC PEPTIDE: B Natriuretic Peptide: 649 pg/mL — ABNORMAL HIGH (ref 0.0–100.0)

## 2020-04-08 LAB — RESP PANEL BY RT-PCR (FLU A&B, COVID) ARPGX2
Influenza A by PCR: NEGATIVE
Influenza B by PCR: NEGATIVE
SARS Coronavirus 2 by RT PCR: NEGATIVE

## 2020-04-08 LAB — GLUCOSE, CAPILLARY: Glucose-Capillary: 290 mg/dL — ABNORMAL HIGH (ref 70–99)

## 2020-04-08 MED ORDER — IPRATROPIUM BROMIDE 0.02 % IN SOLN
0.5000 mg | Freq: Once | RESPIRATORY_TRACT | Status: AC
  Administered 2020-04-08: 0.5 mg via RESPIRATORY_TRACT
  Filled 2020-04-08: qty 2.5

## 2020-04-08 MED ORDER — INSULIN DETEMIR 100 UNIT/ML ~~LOC~~ SOLN
32.0000 [IU] | Freq: Every day | SUBCUTANEOUS | Status: DC
  Administered 2020-04-09 – 2020-04-11 (×3): 32 [IU] via SUBCUTANEOUS
  Filled 2020-04-08 (×4): qty 0.32

## 2020-04-08 MED ORDER — PREDNISONE 20 MG PO TABS
50.0000 mg | ORAL_TABLET | Freq: Every day | ORAL | Status: DC
  Administered 2020-04-09 – 2020-04-11 (×3): 50 mg via ORAL
  Filled 2020-04-08: qty 2
  Filled 2020-04-08 (×2): qty 1

## 2020-04-08 MED ORDER — FUROSEMIDE 10 MG/ML IJ SOLN
40.0000 mg | Freq: Once | INTRAMUSCULAR | Status: AC
  Administered 2020-04-08: 40 mg via INTRAVENOUS
  Filled 2020-04-08: qty 4

## 2020-04-08 MED ORDER — ONDANSETRON HCL 4 MG PO TABS: 4.0000 mg | ORAL_TABLET | Freq: Four times a day (QID) | ORAL | Status: DC | PRN

## 2020-04-08 MED ORDER — INSULIN ASPART 100 UNIT/ML ~~LOC~~ SOLN
0.0000 [IU] | Freq: Three times a day (TID) | SUBCUTANEOUS | Status: DC
  Administered 2020-04-09 (×2): 3 [IU] via SUBCUTANEOUS
  Administered 2020-04-09: 5 [IU] via SUBCUTANEOUS
  Administered 2020-04-10: 3 [IU] via SUBCUTANEOUS
  Administered 2020-04-11: 5 [IU] via SUBCUTANEOUS
  Administered 2020-04-11: 2 [IU] via SUBCUTANEOUS

## 2020-04-08 MED ORDER — ALBUTEROL SULFATE (2.5 MG/3ML) 0.083% IN NEBU
2.5000 mg | INHALATION_SOLUTION | Freq: Four times a day (QID) | RESPIRATORY_TRACT | Status: DC
  Administered 2020-04-08: 2.5 mg via RESPIRATORY_TRACT
  Filled 2020-04-08: qty 3

## 2020-04-08 MED ORDER — AMLODIPINE BESYLATE 5 MG PO TABS
5.0000 mg | ORAL_TABLET | Freq: Every day | ORAL | Status: DC
  Administered 2020-04-09 – 2020-04-11 (×3): 5 mg via ORAL
  Filled 2020-04-08 (×3): qty 1

## 2020-04-08 MED ORDER — ALBUTEROL (5 MG/ML) CONTINUOUS INHALATION SOLN
7.5000 mg/h | INHALATION_SOLUTION | Freq: Once | RESPIRATORY_TRACT | Status: AC
  Administered 2020-04-08: 7.5 mg/h via RESPIRATORY_TRACT
  Filled 2020-04-08: qty 20

## 2020-04-08 MED ORDER — ACETAMINOPHEN 325 MG PO TABS
650.0000 mg | ORAL_TABLET | Freq: Four times a day (QID) | ORAL | Status: DC | PRN
  Administered 2020-04-10: 650 mg via ORAL
  Filled 2020-04-08: qty 2

## 2020-04-08 MED ORDER — FUROSEMIDE 10 MG/ML IJ SOLN
40.0000 mg | Freq: Once | INTRAMUSCULAR | Status: AC
  Administered 2020-04-09: 40 mg via INTRAVENOUS
  Filled 2020-04-08: qty 4

## 2020-04-08 MED ORDER — POLYETHYLENE GLYCOL 3350 17 G PO PACK: 17.0000 g | PACK | Freq: Every day | ORAL | Status: DC | PRN

## 2020-04-08 MED ORDER — ACETAMINOPHEN 650 MG RE SUPP: 650.0000 mg | Freq: Four times a day (QID) | RECTAL | Status: DC | PRN

## 2020-04-08 MED ORDER — LISINOPRIL 10 MG PO TABS
20.0000 mg | ORAL_TABLET | Freq: Every day | ORAL | Status: DC
  Administered 2020-04-09: 20 mg via ORAL
  Filled 2020-04-08: qty 2

## 2020-04-08 MED ORDER — ONDANSETRON HCL 4 MG/2ML IJ SOLN: 4.0000 mg | Freq: Four times a day (QID) | INTRAMUSCULAR | Status: DC | PRN

## 2020-04-08 MED ORDER — ALBUTEROL SULFATE (2.5 MG/3ML) 0.083% IN NEBU
5.0000 mg | INHALATION_SOLUTION | Freq: Once | RESPIRATORY_TRACT | Status: AC
  Administered 2020-04-08: 5 mg via RESPIRATORY_TRACT
  Filled 2020-04-08: qty 6

## 2020-04-08 MED ORDER — ONDANSETRON HCL 4 MG/2ML IJ SOLN
4.0000 mg | Freq: Once | INTRAMUSCULAR | Status: AC
  Administered 2020-04-08: 4 mg via INTRAVENOUS
  Filled 2020-04-08: qty 2

## 2020-04-08 MED ORDER — ALBUTEROL SULFATE (2.5 MG/3ML) 0.083% IN NEBU: 2.5000 mg | INHALATION_SOLUTION | RESPIRATORY_TRACT | Status: DC | PRN

## 2020-04-08 MED ORDER — PNEUMOCOCCAL VAC POLYVALENT 25 MCG/0.5ML IJ INJ: 0.5000 mL | INJECTION | INTRAMUSCULAR | Status: DC

## 2020-04-08 MED ORDER — METHYLPREDNISOLONE SODIUM SUCC 125 MG IJ SOLR
125.0000 mg | Freq: Once | INTRAMUSCULAR | Status: AC
  Administered 2020-04-08: 125 mg via INTRAVENOUS
  Filled 2020-04-08: qty 2

## 2020-04-08 MED ORDER — TRIAMTERENE-HCTZ 37.5-25 MG PO CAPS
1.0000 | ORAL_CAPSULE | Freq: Every day | ORAL | Status: DC
  Filled 2020-04-08 (×3): qty 1

## 2020-04-08 MED ORDER — INSULIN ASPART 100 UNIT/ML ~~LOC~~ SOLN
0.0000 [IU] | Freq: Every day | SUBCUTANEOUS | Status: DC
  Administered 2020-04-08: 3 [IU] via SUBCUTANEOUS
  Administered 2020-04-10: 2 [IU] via SUBCUTANEOUS

## 2020-04-08 MED ORDER — INSULIN DETEMIR 100 UNIT/ML FLEXPEN: 32.0000 [IU] | PEN_INJECTOR | Freq: Every day | SUBCUTANEOUS | Status: DC

## 2020-04-08 MED ORDER — ENOXAPARIN SODIUM 40 MG/0.4ML ~~LOC~~ SOLN
40.0000 mg | SUBCUTANEOUS | Status: DC
  Administered 2020-04-08 – 2020-04-10 (×3): 40 mg via SUBCUTANEOUS
  Filled 2020-04-08 (×3): qty 0.4

## 2020-04-08 MED ORDER — GABAPENTIN 100 MG PO CAPS
100.0000 mg | ORAL_CAPSULE | Freq: Every day | ORAL | Status: DC
  Administered 2020-04-08 – 2020-04-09 (×2): 200 mg via ORAL
  Administered 2020-04-10: 100 mg via ORAL
  Filled 2020-04-08: qty 2
  Filled 2020-04-08 (×2): qty 1

## 2020-04-08 MED ORDER — ATORVASTATIN CALCIUM 10 MG PO TABS
20.0000 mg | ORAL_TABLET | Freq: Every evening | ORAL | Status: DC
  Administered 2020-04-09 – 2020-04-10 (×2): 20 mg via ORAL
  Filled 2020-04-08: qty 2
  Filled 2020-04-08: qty 1

## 2020-04-08 NOTE — ED Provider Notes (Signed)
[facility] Provider Note   CSN: [id number] Arrival date & time: 04/08/20  1105     History Chief Complaint  Patient presents with  . Shortness of Breath    History of SOB but increased at 4am.  Took neb treatment at home with no relief.  Breathing treatment given by EMS.    ALYZE LAUF is a 85 y.o. female with a history of hypertension, hyperlipidemia, type 2 diabetes, COPD and chronic kidney disease, also has an advanced aortic stenosis under the care of Dr. Angelena Form and Dr. Domenic Polite presenting for evaluation of increasing shortness of breath since yesterday.  She reports having slowly worsening shortness of breath along with wheezing which has been worse with ambulation for the past week.  Last night her symptoms became severe, she has been using her home nebulizer treatments, last dose was taken at 4 AM this morning and she gets transient improvement with the symptoms.  She denies fevers or chills, no cough, no chest pain.  She does endorse orthopnea but states this is a chronic problem, denies history of CHF.  No peripheral edema.  Also denies dizziness, diaphoresis, pleuritic pain.  HPI     Past Medical History:  Diagnosis Date  . Aortic stenosis   . Arthritis   . Asthma   . CKD (chronic kidney disease)   . Essential hypertension   . Mixed hyperlipidemia   . Symptomatic anemia    Presumed GI bleed January 2021 - she declined GI work-up  . Type 2 diabetes mellitus [facility])     Patient Active Problem List   Diagnosis Date Noted  . Dyspnea 04/08/2020  . Symptomatic anemia 02/09/2019  . Vitamin B12 deficiency   . Sepsis ([place]) 11/15/2017  . AKI (acute kidney injury) ([place]) 11/15/2017  . Acute lower UTI 11/15/2017  . Uncontrolled type 2 diabetes mellitus with hyperglycemia ([place]) 07/06/2017  . Essential hypertension, benign 07/06/2017  . Mixed hyperlipidemia 07/06/2017  . Class 2 severe obesity due to excess calories with serious comorbidity and body  mass index (BMI) of 36.0 to 36.9 in adult ([place]) 07/06/2017  . Medial meniscus, posterior horn derangement 04/12/2012  . OA (osteoarthritis) of knee 04/12/2012    Past Surgical History:  Procedure Laterality Date  . APPENDECTOMY    . BACK SURGERY    . CYST REMOVAL HAND     Right, hospital in [place] ARTHROSCOPY WITH MEDIAL MENISECTOMY Right 02/28/2012   Procedure: KNEE ARTHROSCOPY WITH MEDIAL MENISECTOMY;  Surgeon: Carole Civil, MD;  Location: [facility];  Service: Orthopedics;  Laterality: Right;  . REPAIR KNEE LIGAMENT       OB History    Gravida  3   Para  3   Term  3   Preterm      AB      Living        SAB      IAB      Ectopic      Multiple      Live Births              Family History  Problem Relation Age of Onset  . Lung disease Other   . Cancer Other   . Arthritis Other   . Asthma Other   . Diabetes Other   . Diabetes Mother   . Hypertension Mother   . Diverticulitis Mother   . Diabetes Father   . Hypertension Sister   . Diabetes Brother   .  Colon cancer Neg Hx     Social History   Tobacco Use  . Smoking status: Never Smoker  . Smokeless tobacco: Never Used  Vaping Use  . Vaping Use: Never used  Substance Use Topics  . Alcohol use: No  . Drug use: No    Home Medications Prior to Admission medications   Medication Sig Start Date End Date Taking? Authorizing Provider  albuterol (PROVENTIL) (2.5 MG/3ML) 0.083% nebulizer solution Take 2.5 mg by nebulization every 6 (six) hours as needed for wheezing or shortness of breath.  10/20/17  Yes [provider]  amLODipine (NORVASC) 5 MG tablet Take 5 mg by mouth daily.   Yes [provider]  atorvastatin (LIPITOR) 20 MG tablet Take 20 mg by mouth every evening.    Yes [provider]  Cyanocobalamin (VITAMIN B 12) 500 MCG TABS Take 1 tablet by mouth daily. 02/26/19  Yes [provider]  gabapentin (NEURONTIN) 100 MG capsule Take 1-2 capsules by mouth at  bedtime. 10/02/19  Yes [provider]  Insulin Detemir (LEVEMIR FLEXTOUCH) 100 UNIT/ML Pen Inject 30 Units into the skin at bedtime. Patient taking differently: Inject 32 Units into the skin daily. 02/10/19  Yes Johnson, Clanford L, MD  lisinopril (ZESTRIL) 20 MG tablet Take 20 mg by mouth daily.   Yes [provider]  metFORMIN (GLUCOPHAGE) 500 MG tablet Take 500 mg by mouth 2 (two) times daily. 01/31/20  Yes [provider]  triamterene-hydrochlorothiazide (DYAZIDE) 37.5-25 MG capsule Take 1 capsule by mouth daily.   Yes [provider]  FEROSUL 325 (65 Fe) MG tablet Take 325 mg by mouth daily. Patient not taking: No sig reported 04/02/19   [provider]  metFORMIN (GLUMETZA) 500 MG (MOD) 24 hr tablet Take 500 mg by mouth daily with breakfast. Patient not taking: No sig reported    [provider]    Allergies    Amoxicillin, Penicillins, and Sulfa antibiotics  Review of Systems   Review of Systems  Constitutional: Negative for chills, diaphoresis and fever.  HENT: Negative for congestion and sore throat.   Eyes: Negative.   Respiratory: Positive for shortness of breath and wheezing. Negative for chest tightness.   Cardiovascular: Negative for chest pain, palpitations and leg swelling.  Gastrointestinal: Negative for abdominal pain, nausea and vomiting.  Genitourinary: Negative.   Musculoskeletal: Negative for arthralgias, joint swelling and neck pain.  Skin: Negative.  Negative for rash and wound.  Neurological: Negative for dizziness, weakness, light-headedness, numbness and headaches.  Psychiatric/Behavioral: Negative.   All other systems reviewed and are negative.   Physical Exam Updated Vital Signs BP (!) 166/94   Pulse 100   Temp 98.4 F (36.9 C)   Resp 17   Ht 5\' 5"  (1.651 m)   Wt 91 kg   SpO2 95%   BMI 33.38 kg/m   Physical Exam Vitals and nursing note reviewed.  Constitutional:      Appearance: She is  well-developed.  HENT:     Head: Normocephalic and atraumatic.  Eyes:     Conjunctiva/sclera: Conjunctivae normal.  Cardiovascular:     Rate and Rhythm: Normal rate and regular rhythm.     Heart sounds: Murmur heard.   Systolic murmur is present with a grade of 4/6.   Pulmonary:     Effort: Pulmonary effort is normal. No tachypnea, accessory muscle usage or respiratory distress.     Breath sounds: No decreased breath sounds, wheezing or rales.  Abdominal:  General: Bowel sounds are normal.     Palpations: Abdomen is soft.     Tenderness: There is no abdominal tenderness.  Musculoskeletal:        General: Normal range of motion.     Cervical back: Normal range of motion.     Right lower leg: No tenderness. Edema present.     Left lower leg: No tenderness. Edema present.     Comments: Trace bilateral ankle edema.  Skin:    General: Skin is warm and dry.  Neurological:     Mental Status: She is alert.     ED Results / Procedures / Treatments   Labs (all labs ordered are listed, but only abnormal results are displayed) Labs Reviewed  BASIC METABOLIC PANEL - Abnormal; Notable for the following components:      Result Value   Glucose, Bld 165 (*)    GFR, Estimated 56 (*)    All other components within normal limits  CBC WITH DIFFERENTIAL/PLATELET - Abnormal; Notable for the following components:   WBC 11.1 (*)    Neutro Abs 8.9 (*)    All other components within normal limits  BRAIN NATRIURETIC PEPTIDE - Abnormal; Notable for the following components:   B Natriuretic Peptide 649.0 (*)    All other components within normal limits  TROPONIN I (HIGH SENSITIVITY) - Abnormal; Notable for the following components:   Troponin I (High Sensitivity) 36 (*)    All other components within normal limits  TROPONIN I (HIGH SENSITIVITY) - Abnormal; Notable for the following components:   Troponin I (High Sensitivity) 52 (*)    All other components within normal limits  RESP PANEL BY  RT-PCR (FLU A&B, COVID) ARPGX2    EKG EKG Interpretation  Date/Time:  Tuesday April 08 2020 11:16:26 EDT Ventricular Rate:  114 PR Interval:    QRS Duration: 76 QT Interval:  317 QTC Calculation: 439 R Axis:   -50 Text Interpretation: Sinus or ectopic atrial tachycardia Left anterior fascicular block Anterior infarct, old Minimal ST depression, inferior leads rate is faster compared to Jan 2021, otherwise is similar Confirmed by Sherwood Gambler [phone]) on 04/08/2020 11:32:34 AM   Radiology DG Chest Port 1 View  Result Date: 04/08/2020 CLINICAL DATA:  Shortness of breath. EXAM: PORTABLE CHEST 1 VIEW COMPARISON:  02/09/2019. FINDINGS: Mediastinum hilar structures normal. Cardiomegaly. Mild pulmonary venous congestion. Mild bilateral interstitial prominence. Mild CHF cannot be excluded. Small scratched it tiny left pleural effusion cannot be excluded. No pneumothorax. IMPRESSION: Cardiomegaly with mild pulmonary venous congestion and mild bilateral interstitial prominence. Mild CHF cannot be excluded. Tiny left pleural effusion cannot be excluded. Electronically Signed   By: Marcello Moores  Register   On: 04/08/2020 12:49    Procedures Procedures   Medications Ordered in ED Medications  albuterol (PROVENTIL,VENTOLIN) solution continuous neb (has no administration in time range)  albuterol (PROVENTIL) (2.5 MG/3ML) 0.083% nebulizer solution 5 mg (5 mg Nebulization Given 04/08/20 1515)  ipratropium (ATROVENT) nebulizer solution 0.5 mg (0.5 mg Nebulization Given 04/08/20 1515)  methylPREDNISolone sodium succinate (SOLU-MEDROL) 125 mg/2 mL injection 125 mg (125 mg Intravenous Given 04/08/20 1450)  furosemide (LASIX) injection 40 mg (40 mg Intravenous Given 04/08/20 1521)  ondansetron (ZOFRAN) injection 4 mg (4 mg Intravenous Given 04/08/20 1518)    ED Course  I have reviewed the triage vital signs and the nursing notes.  Pertinent labs & imaging results that were available during my care of the  patient were reviewed by me and considered in my medical  decision making (see chart for details).  Clinical Course as of 04/08/20 1709  Tue Apr 08, 2020  1441 At re-exam, pt now with complaints of chest tightness and is starting to wheeze. Albuterol/atrovent neb, solumedrol ordered. [JI]    Clinical Course User Index [JI] Evalee Jefferson, PA-C   MDM Rules/Calculators/A&P                          Patient's initial exam was negative for wheezing or any respiratory distress.  At reexam she was having increased tightness and sparse expiratory wheeze was present.  She was given albuterol and Atrovent nebulizer and given IV Solu-Medrol as well.  Her chest x-ray suggest a trace effusion, her BNP is also elevated.  She was then given Lasix 40 mg IV for diuresis.  She had no complaint of shortness of breath after she received the nebulizer treatment, however her lung exam was objectively worsened with increased expiratory wheezing.  At this point she was hypoxic to 88% on room air.  She was placed on 2 L nasal cannula and her oxygen improved to 93%.  Ordered additional albuterol neb.   Given this apparent new onset of at least mild CHF and her known aortic stenosis, consult placed to discuss with cardiology.  Discussed with Dr. Paticia Stack of cardiology - recommends admission to hospitalist service, agrees with low dose lasix to help better define if sx are more her copd or is this new chf and/or complicating advancing AS.    Discussed with Dr. Denton Brick who accepts pt for admission.   Final Clinical Impression(s) / ED Diagnoses Final diagnoses:  COPD exacerbation ([place])  Hypoxia  Nonrheumatic aortic valve stenosis  Pleural effusion    Rx / DC Orders ED Discharge Orders    None       Landis Martins 04/08/20 1709    Sherwood Gambler, MD 04/11/20 [phone]

## 2020-04-08 NOTE — ED Notes (Signed)
Assisted pt to bathroom by wheelchair states she is feel short whinned will advise RN

## 2020-04-08 NOTE — H&P (Addendum)
History and Physical    Stephanie [name] CNO:[id number] DOB: 1935/08/17 DOA: 04/08/2020  PCP: Abran Richard, MD   Patient coming from: Home  I have personally briefly reviewed patient's old medical records in [place]  Chief Complaint: Difficulty breathing  HPI: Stephanie [name] is a 85 y.o. female with medical history significant for DM, HTN, aortic stenosis, asthma patient presented to the ED with complaints of difficulty breathing that has been going on for a very long time, but feels it is getting worse.  Her difficulty breathing is provoked by exertion and improves with rest.  She reports stable 2 pillow use.  She is unaware of any weight gain.  She reports mild swelling involving her bilateral lower extremities over the past 2 months.  Mild erythema to left lower extremity noted today, without pain. She denies chest pain.  No dizziness.  Remote smoking history of 1 pack/day.   ED Course:   heart rate 93 216.  Respiratory rate 15-33, blood pressure systolic 294T to 654Y.  O2 sats noted to be 88 to 90% by EDP, patient was placed on nasal cannula.  BNP 649.  Troponin 36 > 52.  Portable chest x-ray shows mild pulmonary venous congestion, mild CHF cannot be excluded.  Wheezing noted in ED, albuterol nebulizer and IV Solu-Medrol given. IV Lasix 40 mg x 1 given.  EDP talked to cardiologist Dr. Paticia Stack, recommended admission.   Review of Systems: As per HPI all other systems reviewed and negative.  Past Medical History:  Diagnosis Date  . Aortic stenosis   . Arthritis   . Asthma   . CKD (chronic kidney disease)   . Essential hypertension   . Mixed hyperlipidemia   . Symptomatic anemia    Presumed GI bleed January 2021 - she declined GI work-up  . Type 2 diabetes mellitus ([place])     Past Surgical History:  Procedure Laterality Date  . APPENDECTOMY    . BACK SURGERY    . CYST REMOVAL HAND     Right, hospital in [employer] ARTHROSCOPY WITH MEDIAL MENISECTOMY Right 02/28/2012    Procedure: KNEE ARTHROSCOPY WITH MEDIAL MENISECTOMY;  Surgeon: Carole Civil, MD;  Location: [facility];  Service: Orthopedics;  Laterality: Right;  . REPAIR KNEE LIGAMENT       reports that she has never smoked. She has never used smokeless tobacco. She reports that she does not drink alcohol and does not use drugs.  Allergies  Allergen Reactions  . Amoxicillin Swelling    Tongue swelling   . Penicillins Swelling    Has patient had a PCN reaction causing immediate rash, facial/tongue/throat swelling, SOB or lightheadedness with hypotension: No Has patient had a PCN reaction causing severe rash involving mucus membranes or skin necrosis: No Has patient had a PCN reaction that required hospitalization: No Has patient had a PCN reaction occurring within the last 10 years: No If all of the above answers are "NO", then may proceed with Cephalosporin use.   . Sulfa Antibiotics Swelling    Lip swelling    Family History  Problem Relation Age of Onset  . Lung disease Other   . Cancer Other   . Arthritis Other   . Asthma Other   . Diabetes Other   . Diabetes Mother   . Hypertension Mother   . Diverticulitis Mother   . Diabetes Father   . Hypertension Sister   . Diabetes Brother   . Colon cancer Neg Hx  Prior to Admission medications   Medication Sig Start Date End Date Taking? Authorizing Provider  albuterol (PROVENTIL) (2.5 MG/3ML) 0.083% nebulizer solution Take 2.5 mg by nebulization every 6 (six) hours as needed for wheezing or shortness of breath.  10/20/17  Yes [provider]  amLODipine (NORVASC) 5 MG tablet Take 5 mg by mouth daily.   Yes [provider]  atorvastatin (LIPITOR) 20 MG tablet Take 20 mg by mouth every evening.    Yes [provider]  Cyanocobalamin (VITAMIN B 12) 500 MCG TABS Take 1 tablet by mouth daily. 02/26/19  Yes [provider]  gabapentin (NEURONTIN) 100 MG capsule Take 1-2 capsules by mouth at bedtime. 10/02/19   Yes [provider]  Insulin Detemir (LEVEMIR FLEXTOUCH) 100 UNIT/ML Pen Inject 30 Units into the skin at bedtime. Patient taking differently: Inject 32 Units into the skin daily. 02/10/19  Yes Johnson, Clanford L, MD  lisinopril (ZESTRIL) 20 MG tablet Take 20 mg by mouth daily.   Yes [provider]  metFORMIN (GLUCOPHAGE) 500 MG tablet Take 500 mg by mouth 2 (two) times daily. 01/31/20  Yes [provider]  triamterene-hydrochlorothiazide (DYAZIDE) 37.5-25 MG capsule Take 1 capsule by mouth daily.   Yes [provider]  FEROSUL 325 (65 Fe) MG tablet Take 325 mg by mouth daily. Patient not taking: No sig reported 04/02/19   [provider]  metFORMIN (GLUMETZA) 500 MG (MOD) 24 hr tablet Take 500 mg by mouth daily with breakfast. Patient not taking: No sig reported    [provider]    Physical Exam: Vitals:   04/08/20 1430 04/08/20 1500 04/08/20 1530 04/08/20 1630  BP: (!) 155/83 (!) 146/90  (!) 166/94  Pulse: 93 98 99 100  Resp: (!) 22 19 15 17   Temp:      SpO2: 100% 98% 94% 95%  Weight:      Height:        Constitutional: NAD, calm, comfortable Vitals:   04/08/20 1430 04/08/20 1500 04/08/20 1530 04/08/20 1630  BP: (!) 155/83 (!) 146/90  (!) 166/94  Pulse: 93 98 99 100  Resp: (!) 22 19 15 17   Temp:      SpO2: 100% 98% 94% 95%  Weight:      Height:       Eyes: PERRL, lids and conjunctivae normal ENMT: Mucous membranes are moist.  Neck: normal, supple, no masses, no thyromegaly Respiratory: O2 sats 93% on 1-1/2 L on my evaluation, , very faint expiratory wheezing right lower lung zone , no crackles. Normal respiratory effort. No accessory muscle use.  Cardiovascular: Regular rate and rhythm, 4/6 systolic murmur, no  rubs / gallops.  Trace pitting extremity edema involving left lower extremity only, with mild erythema, no tenderness, no differential warmth.  2+ pedal pulses.  Abdomen: no tenderness, no masses palpated. No  hepatosplenomegaly. Bowel sounds positive.  Musculoskeletal: no clubbing / cyanosis. No joint deformity upper and lower extremities. Good ROM, no contractures. Normal muscle tone.  Skin: no rashes, lesions, ulcers. No induration Neurologic: No apparent cranial nerve abnormality, moving extremities spontaneously. Psychiatric: Normal judgment and insight. Alert and oriented x 3. Normal mood.   Labs on Admission: I have personally reviewed following labs and imaging studies  CBC: Recent Labs  Lab 04/08/20 1144  WBC 11.1*  NEUTROABS 8.9*  HGB 12.4  HCT 39.4  MCV 87.0  PLT 676   Basic Metabolic Panel: Recent Labs  Lab 04/08/20 1144  NA 137  K 4.8  CL 104  CO2 24  GLUCOSE 165*  BUN 17  CREATININE 1.00  CALCIUM 10.1    Radiological Exams on Admission: DG Chest Port 1 View  Result Date: 04/08/2020 CLINICAL DATA:  Shortness of breath. EXAM: PORTABLE CHEST 1 VIEW COMPARISON:  02/09/2019. FINDINGS: Mediastinum hilar structures normal. Cardiomegaly. Mild pulmonary venous congestion. Mild bilateral interstitial prominence. Mild CHF cannot be excluded. Small scratched it tiny left pleural effusion cannot be excluded. No pneumothorax. IMPRESSION: Cardiomegaly with mild pulmonary venous congestion and mild bilateral interstitial prominence. Mild CHF cannot be excluded. Tiny left pleural effusion cannot be excluded. Electronically Signed   By: Marcello Moores  Register   On: 04/08/2020 12:49    EKG: Independently reviewed.  Sinus or ectopic tachycardia, rate 114.  QTc 439.  No significant change from prior.  Assessment/Plan Principal Problem:   Dyspnea Active Problems:   Aortic stenosis   Essential hypertension, benign   Asthma   DM (diabetes mellitus) ([place])   Acute respiratory failure-mild, O2 sat 88 to 90% on room air, currently on 1 and 1/2 L sats 93%.  Likely due to asthma exacerbation and possible component of aortic stenosis.  Chest x-ray shows mild venous congestion (personally  reviewed chest x-ray not significantly different from prior) and BNP 649.  Weights per chart are stable over the past year.  With trace pitting edema to left lower extremity only. -Supplemental oxygen -Left lower extremity venous Doppler - IV Lasix 40 q12hr x2 doses  -Strict input output, daily weights -BMP a.m.  Aortic stenosis-recent echo 02/22/2020, EF 55 to 60%, indeterminate LV diastolic parameters, aortic valve not well visualized, mild to moderate aortic valve stenosis, with mild aortic valve regurgitation. -Follows with Dr. Angelena Form, per last note 02/22/2020, deemed to have moderately severe AS repeat echo was planned for 6 months time, and if valve progresses to severe, patient would be reasonable candidate for TAVR. -If no improvement in respiratory symptoms, consider limited echocardiogram and cardiology consult otherwise follow-up as outpatient.   -Trend troponin  Controlled diabetes mellitus-glucose 165. HgbA1c-7.3 -Resume home Levemir 32 units nightly - Hold home Metformin  - SSI- M, monitor while on steroids.  Asthma exacerbation-  mild, improving.  Faint wheezing on exam. -Resume albuterol as needed and scheduled -Prednisone 40 mg daily  HTN- Stable - Resume Norvasc, Lisinopril, triamterene- HCTZ.   DVT prophylaxis: Lovenox Code Status: full code Family Communication: None at bedside. Disposition Plan:  ~ 1 -2 days Consults called: None Admission status: Obs, tele   Bethena Roys MD [facility]  04/08/2020, 7:13 PM

## 2020-04-08 NOTE — ED Notes (Signed)
Tolerating po fluids well.
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
## 2020-04-09 ENCOUNTER — Observation Stay (HOSPITAL_BASED_OUTPATIENT_CLINIC_OR_DEPARTMENT_OTHER): Payer: Medicare HMO

## 2020-04-09 ENCOUNTER — Observation Stay (HOSPITAL_COMMUNITY): Payer: Medicare HMO

## 2020-04-09 ENCOUNTER — Encounter (HOSPITAL_COMMUNITY): Payer: Self-pay | Admitting: Internal Medicine

## 2020-04-09 DIAGNOSIS — R0600 Dyspnea, unspecified: Secondary | ICD-10-CM | POA: Diagnosis not present

## 2020-04-09 DIAGNOSIS — I350 Nonrheumatic aortic (valve) stenosis: Secondary | ICD-10-CM

## 2020-04-09 DIAGNOSIS — J449 Chronic obstructive pulmonary disease, unspecified: Secondary | ICD-10-CM

## 2020-04-09 DIAGNOSIS — I10 Essential (primary) hypertension: Secondary | ICD-10-CM | POA: Diagnosis not present

## 2020-04-09 DIAGNOSIS — I5031 Acute diastolic (congestive) heart failure: Secondary | ICD-10-CM

## 2020-04-09 LAB — BASIC METABOLIC PANEL
Anion gap: 9 (ref 5–15)
BUN: 30 mg/dL — ABNORMAL HIGH (ref 8–23)
CO2: 26 mmol/L (ref 22–32)
Calcium: 9.8 mg/dL (ref 8.9–10.3)
Chloride: 101 mmol/L (ref 98–111)
Creatinine, Ser: 1.44 mg/dL — ABNORMAL HIGH (ref 0.44–1.00)
GFR, Estimated: 36 mL/min — ABNORMAL LOW (ref >60)
Glucose, Bld: 252 mg/dL — ABNORMAL HIGH (ref 70–99)
Potassium: 5.3 mmol/L — ABNORMAL HIGH (ref 3.5–5.1)
Sodium: 136 mmol/L (ref 135–145)

## 2020-04-09 LAB — ECHOCARDIOGRAM LIMITED
AR max vel: 0.74 cm2
AV Area VTI: 0.81 cm2
AV Area mean vel: 0.61 cm2
AV Mean grad: 25.1 mmHg
AV Peak grad: 43.8 mmHg
Ao pk vel: 3.31 m/s
Area-P 1/2: 2.07 cm2
Height: 65 in
S' Lateral: 3.48 cm
Weight: 3171.1 oz

## 2020-04-09 LAB — GLUCOSE, CAPILLARY
Glucose-Capillary: 210 mg/dL — ABNORMAL HIGH (ref 70–99)
Glucose-Capillary: 166 mg/dL — ABNORMAL HIGH (ref 70–99)
Glucose-Capillary: 173 mg/dL — ABNORMAL HIGH (ref 70–99)
Glucose-Capillary: 159 mg/dL — ABNORMAL HIGH (ref 70–99)

## 2020-04-09 MED ORDER — TRIAMTERENE-HCTZ 37.5-25 MG PO TABS
1.0000 | ORAL_TABLET | Freq: Every day | ORAL | Status: DC
  Administered 2020-04-09: 1 via ORAL
  Filled 2020-04-09: qty 1

## 2020-04-09 MED ORDER — SODIUM ZIRCONIUM CYCLOSILICATE 10 G PO PACK
10.0000 g | PACK | Freq: Once | ORAL | Status: AC
  Administered 2020-04-09: 10 g via ORAL
  Filled 2020-04-09: qty 1

## 2020-04-09 MED ORDER — HYDRALAZINE HCL 25 MG PO TABS
25.0000 mg | ORAL_TABLET | Freq: Three times a day (TID) | ORAL | Status: DC
  Administered 2020-04-09 – 2020-04-11 (×4): 25 mg via ORAL
  Filled 2020-04-09 (×4): qty 1

## 2020-04-09 NOTE — Plan of Care (Signed)

## 2020-04-09 NOTE — Care Management Obs Status (Signed)
[facility] NOTIFICATION   Patient Details  Name: Stephanie [name] MRN: [id number] Date of Birth: Mar 11, 1935   Medicare Observation Status Notification Given:  Yes    Tommy Medal 04/09/2020, 3:09 PM

## 2020-04-09 NOTE — Progress Notes (Signed)
    Dr. Paticia Stack reviewed the patient with Dr. Angelena Form and the plan at this time is to transfer her to [facility] this week. Will tentatively add to the board for tomorrow but creatinine has increased from 1.00 to 1.44. Will stop HCTZ and Lisinopril for now. Hold further doses of IV Lasix. Given elevated BP, will add Hydralazine 25mg  TID.   Discussed the plan with the patient who is in agreement as well. Cardiac catheterization reviewed in detail. The patient understands that risks include but are not limited to stroke (1 in 1000), death (1 in 68), kidney failure [usually temporary] (1 in 500), bleeding (1 in 200), allergic reaction [possibly serious] (1 in 200).    Engineer, maintenance (IT) at [facility] and Structural Heart Team made aware of transfer. Will assign to the Cardiology service once at [facility].   Signed, Erma Heritage, PA-C 04/09/2020, 11:59 AM Pager: [phone]

## 2020-04-09 NOTE — Progress Notes (Addendum)
PROGRESS NOTE    Stephanie [name]  HUT:[id number] DOB: 1935/08/08 DOA: 04/08/2020 PCP: Abran Richard, MD   Brief Narrative:  Stephanie [name] is a 85 y.o. female with medical history significant for DM, HTN, aortic stenosis, asthma patient presented to the ED with complaints of difficulty breathing that has been going on for a very long time, but feels it is getting worse.  Her difficulty breathing is provoked by exertion and improves with rest.  Patient was admitted with acute hypoxemic respiratory failure that was multifactorial in the setting of moderate to severe aortic stenosis with some volume overload as well as some asthma.  She has been seen by cardiology with plans to transfer to [facility] for cardiac catheterization and will be on the Cardiology service.  Assessment & Plan:   Principal Problem:   Dyspnea Active Problems:   Essential hypertension, benign   Asthma   DM (diabetes mellitus) (HCC)   Aortic stenosis   Acute hypoxemic respiratory failure-multifactorial -Currently only on 2 L nasal cannula oxygen -See treatments for CHF and asthma exacerbation as below -Wean oxygen as tolerated  Acute diastolic CHF exacerbation in the setting of moderate to severe aortic stenosis -Repeat limited 2D echocardiogram per cardiology -Appears to be euvolemic this a.m. with creatinine elevation noted on BMP, hold further diuresis -Transfer to [facility] for cardiac catheterization under cardiology service  Mild asthma with exacerbation -Wheezing has improved this a.m. -Continue breathing treatments as needed -Prednisone 40 mg daily for now  Mild hyperkalemia -Lokelma given -Follow a.m.  Controlled type 2 diabetes -Hemoglobin A1c 7.3% -Continue SSI and home Levemir -Holding Metformin  Hypertension-stable -Continue home medications  DVT prophylaxis: Lovenox Code Status: Full Family Communication: Discussed with daughter on phone 3/23 Disposition Plan:  Status is:  Observation  The patient will require care spanning > 2 midnights and should be moved to inpatient because: Ongoing diagnostic testing needed not appropriate for outpatient work up and Inpatient level of care appropriate due to severity of illness  Dispo: The patient is from: Home              Anticipated d/c is to: Home              Patient currently is not medically stable to d/c.   Difficult to place patient No   Consultants:   Cardiology  Procedures:   See below  Antimicrobials:   None   Subjective: Patient seen and evaluated today with no new acute complaints or concerns. No acute concerns or events noted overnight.  She denies any chest pain and states that her shortness of breath has improved considerably.  Objective: Vitals:   04/08/20 2218 04/09/20 0530 04/09/20 0700 04/09/20 0847  BP:  118/64  (!) 143/70  Pulse:  90  69  Resp:  20    Temp:  97.6 F (36.4 C)  (!) 97.5 F (36.4 C)  TempSrc:    Oral  SpO2: 96% 96%  100%  Weight:   89.9 kg   Height:        Intake/Output Summary (Last 24 hours) at 04/09/2020 1134 Last data filed at 04/09/2020 1035 Gross per 24 hour  Intake --  Output 700 ml  Net -700 ml   Filed Weights   04/08/20 1116 04/09/20 0700  Weight: 91 kg 89.9 kg    Examination:  General exam: Appears calm and comfortable, obese Respiratory system: Clear to auscultation. Respiratory effort normal.  Currently on 2 L nasal cannula Cardiovascular system: S1 &  S2 heard, RRR.  Gastrointestinal system: Abdomen is soft Central nervous system: Alert and awake Extremities: No edema Skin: No significant lesions noted Psychiatry: Flat affect.    Data Reviewed: I have personally reviewed following labs and imaging studies  CBC: Recent Labs  Lab 04/08/20 1144  WBC 11.1*  NEUTROABS 8.9*  HGB 12.4  HCT 39.4  MCV 87.0  PLT 161   Basic Metabolic Panel: Recent Labs  Lab 04/08/20 1144 04/09/20 0914  NA 137 136  K 4.8 5.3*  CL 104 101  CO2  24 26  GLUCOSE 165* 252*  BUN 17 30*  CREATININE 1.00 1.44*  CALCIUM 10.1 9.8   GFR: Estimated Creatinine Clearance: 33.4 mL/min (A) (by C-G formula based on SCr of 1.44 mg/dL (H)). Liver Function Tests: No results for input(s): AST, ALT, ALKPHOS, BILITOT, PROT, ALBUMIN in the last 168 hours. No results for input(s): LIPASE, AMYLASE in the last 168 hours. No results for input(s): AMMONIA in the last 168 hours. Coagulation Profile: No results for input(s): INR, PROTIME in the last 168 hours. Cardiac Enzymes: No results for input(s): CKTOTAL, CKMB, CKMBINDEX, TROPONINI in the last 168 hours. BNP (last 3 results) No results for input(s): PROBNP in the last 8760 hours. HbA1C: No results for input(s): HGBA1C in the last 72 hours. CBG: Recent Labs  Lab 04/08/20 2207 04/09/20 0729 04/09/20 1109  GLUCAP 290* 210* 166*   Lipid Profile: No results for input(s): CHOL, HDL, LDLCALC, TRIG, CHOLHDL, LDLDIRECT in the last 72 hours. Thyroid Function Tests: No results for input(s): TSH, T4TOTAL, FREET4, T3FREE, THYROIDAB in the last 72 hours. Anemia Panel: No results for input(s): VITAMINB12, FOLATE, FERRITIN, TIBC, IRON, RETICCTPCT in the last 72 hours. Sepsis Labs: No results for input(s): PROCALCITON, LATICACIDVEN in the last 168 hours.  Recent Results (from the past 240 hour(s))  Resp Panel by RT-PCR (Flu A&B, Covid) Nasopharyngeal Swab     Status: None   Collection Time: 04/08/20 12:00 PM   Specimen: Nasopharyngeal Swab; Nasopharyngeal(NP) swabs in vial transport medium  Result Value Ref Range Status   SARS Coronavirus 2 by RT PCR NEGATIVE NEGATIVE Final    Comment: (NOTE) SARS-CoV-2 target nucleic acids are NOT DETECTED.  The SARS-CoV-2 RNA is generally detectable in upper respiratory specimens during the acute phase of infection. The lowest concentration of SARS-CoV-2 viral copies this assay can detect is 138 copies/mL. A negative result does not preclude SARS-Cov-2 infection  and should not be used as the sole basis for treatment or other patient management decisions. A negative result may occur with  improper specimen collection/handling, submission of specimen other than nasopharyngeal swab, presence of viral mutation(s) within the areas targeted by this assay, and inadequate number of viral copies(<138 copies/mL). A negative result must be combined with clinical observations, patient history, and epidemiological information. The expected result is Negative.  Fact Sheet for Patients:  EntrepreneurPulse.com.au  Fact Sheet for Healthcare Providers:  IncredibleEmployment.be  This test is no t yet approved or cleared by the [place] FDA and  has been authorized for detection and/or diagnosis of SARS-CoV-2 by FDA under an Emergency Use Authorization (EUA). This EUA will remain  in effect (meaning this test can be used) for the duration of the COVID-19 declaration under Section 564(b)(1) of the Act, 21 U.S.C.section 360bbb-3(b)(1), unless the authorization is terminated  or revoked sooner.       Influenza A by PCR NEGATIVE NEGATIVE Final   Influenza B by PCR NEGATIVE NEGATIVE Final    Comment: (NOTE)  The Xpert Xpress SARS-CoV-2/FLU/RSV plus assay is intended as an aid in the diagnosis of influenza from Nasopharyngeal swab specimens and should not be used as a sole basis for treatment. Nasal washings and aspirates are unacceptable for Xpert Xpress SARS-CoV-2/FLU/RSV testing.  Fact Sheet for Patients: EntrepreneurPulse.com.au  Fact Sheet for Healthcare Providers: IncredibleEmployment.be  This test is not yet approved or cleared by the [place] FDA and has been authorized for detection and/or diagnosis of SARS-CoV-2 by FDA under an Emergency Use Authorization (EUA). This EUA will remain in effect (meaning this test can be used) for the duration of the COVID-19 declaration  under Section 564(b)(1) of the Act, 21 U.S.C. section 360bbb-3(b)(1), unless the authorization is terminated or revoked.  Performed at [facility], [address], [place], [place] [address]          Radiology Studies: [facility] Chest [facility] 1 View  Result Date: 04/08/2020 CLINICAL DATA:  Shortness of breath. EXAM: PORTABLE CHEST 1 VIEW COMPARISON:  02/09/2019. FINDINGS: Mediastinum hilar structures normal. Cardiomegaly. Mild pulmonary venous congestion. Mild bilateral interstitial prominence. Mild CHF cannot be excluded. Small scratched it tiny left pleural effusion cannot be excluded. No pneumothorax. IMPRESSION: Cardiomegaly with mild pulmonary venous congestion and mild bilateral interstitial prominence. Mild CHF cannot be excluded. Tiny left pleural effusion cannot be excluded. Electronically Signed   By: Marcello Moores  Register   On: 04/08/2020 12:49        Scheduled Meds: . amLODipine  5 mg Oral Daily  . atorvastatin  20 mg Oral QPM  . enoxaparin (LOVENOX) injection  40 mg Subcutaneous Q24H  . gabapentin  100-200 mg Oral QHS  . hydrALAZINE  25 mg Oral Q8H  . insulin aspart  0-15 Units Subcutaneous TID WC  . insulin aspart  0-5 Units Subcutaneous QHS  . insulin detemir  32 Units Subcutaneous Daily  . pneumococcal 23 valent vaccine  0.5 mL Intramuscular Tomorrow-1000  . predniSONE  50 mg Oral Q breakfast    LOS: 0 days    Time spent: 35 minutes    Brett Darko Darleen Crocker, DO [facility]  If 7PM-7AM, please contact night-coverage [email or web address] 04/09/2020, 11:34 AM

## 2020-04-09 NOTE — Consult Note (Signed)
Cardiology Consultation:   Patient ID: Stephanie [name] MRN: [id number]; DOB: 12/21/1935  Admit date: 04/08/2020 Date of Consult: 04/09/2020  PCP:  Abran Richard, [place]  Cardiologist:  Rozann Lesches, MD   Patient Profile:   Stephanie [name] is a 85 y.o. female with past medical history of HTN, HLD, IDDM, COPD, anemia and aortic stenosis who is being seen today for the evaluation of CHF at the request of Dr. Denton Brick.  History of Present Illness:   Stephanie [name] was last examined by Ermalinda Barrios, PA-C in 01/2020 and reported a recent hospitalization for possible CVA vs. heart attack and she was unaware of the specifics at that time. Records were obtained and showed she had been hospitalized for hypertensive urgency and CTA was negative for PE that admission. She did undergo a NST which showed no ischemia but EF was read as 29%. An echo was obtained and showed her EF at 55% with Grade 1 DD and moderate AS. Was referred to the [facility] for further evaluation. Met with Dr. Angelena Form in 02/2020 and findings were felt to be consistent with moderate AS by recent echo with mean gradient at 22 mmHg. A repeat echo was recommended in 6 months.   She presented to [facility] ED on 04/08/2020 for evaluation of worsening dyspnea and wheezing. In talking with the patient today, she reports she does experience intermittent dyspnea and wheezing and typically uses her nebulizer with improvement in her symptoms. The night before her admission, she reports having orthopnea and PND which improved with sitting on the side of her bed. Used her nebulizer but symptoms did not significantly improve. She developed worsening dyspnea the following morning while walking around her home which prompted her to call EMS. She denies any associated chest pain or palpitations. No recent dizziness or presyncope. Had noticed worsening lower extremity edema but unsure of any change in  her weight. She does not add salt to her food or consume fast-food regularly.   Initial labs showed WBC 11.1, Hgb 12.4, platelets 208, Na+ 137, K+ 4.8 and creatinine 1.00. BNP 649. Initial and repeat HS Troponin values flat at 36, 52 and 45. COVID negative. CXR showed cardiomegaly with mild pulmonary venous congestion and mild bilateral interstitial prominence. EKG showed sinus tachycardia, HR 114 with baseline artifact.  She was started on IV Lasix 29m daily and full I&O's not recorded.    Past Medical History:  Diagnosis Date  . Aortic stenosis   . Arthritis   . Asthma   . CKD (chronic kidney disease)   . Essential hypertension   . Mixed hyperlipidemia   . Symptomatic anemia    Presumed GI bleed January 2021 - she declined GI work-up  . Type 2 diabetes mellitus ([place]     Past Surgical History:  Procedure Laterality Date  . APPENDECTOMY    . BACK SURGERY    . CYST REMOVAL HAND     [place] 02[place]2012   Procedure: KNEE ARTHROSCOPY WITH MEDIAL MENISECTOMY;  Surgeon: SCarole Civil MD;  Location: [facility];  Service: Orthopedics;  Laterality: Right;  . REPAIR KNEE LIGAMENT       Home Medications:  Prior to Admission medications   Medication Sig Start Date End Date Taking? Authorizing Provider  albuterol (PROVENTIL) (2.5 MG/3ML) 0.083% nebulizer solution Take 2.5 mg by nebulization every 6 (six) hours as needed for wheezing or shortness  of breath.  10/20/17  Yes [provider]  amLODipine (NORVASC) 5 MG tablet Take 5 mg by mouth daily.   Yes [provider]  atorvastatin (LIPITOR) 20 MG tablet Take 20 mg by mouth every evening.    Yes [provider]  Cyanocobalamin (VITAMIN B 12) 500 MCG TABS Take 1 tablet by mouth daily. 02/26/19  Yes [provider]  gabapentin (NEURONTIN) 100 MG capsule Take 1-2 capsules by mouth at bedtime. 10/02/19  Yes [provider]  Insulin Detemir  (LEVEMIR FLEXTOUCH) 100 UNIT/ML Pen Inject 30 Units into the skin at bedtime. Patient taking differently: Inject 32 Units into the skin daily. 02/10/19  Yes Johnson, Clanford L, MD  lisinopril (ZESTRIL) 20 MG tablet Take 20 mg by mouth daily.   Yes [provider]  metFORMIN (GLUCOPHAGE) 500 MG tablet Take 500 mg by mouth 2 (two) times daily. 01/31/20  Yes [provider]  triamterene-hydrochlorothiazide (DYAZIDE) 37.5-25 MG capsule Take 1 capsule by mouth daily.   Yes [provider]  FEROSUL 325 (65 Fe) MG tablet Take 325 mg by mouth daily. Patient not taking: No sig reported 04/02/19   [provider]  metFORMIN (GLUMETZA) 500 MG (MOD) 24 hr tablet Take 500 mg by mouth daily with breakfast. Patient not taking: No sig reported    [provider]    Inpatient Medications: Scheduled Meds: . amLODipine  5 mg Oral Daily  . atorvastatin  20 mg Oral QPM  . enoxaparin (LOVENOX) injection  40 mg Subcutaneous Q24H  . furosemide  40 mg Intravenous Once  . gabapentin  100-200 mg Oral QHS  . insulin aspart  0-15 Units Subcutaneous TID WC  . insulin aspart  0-5 Units Subcutaneous QHS  . insulin detemir  32 Units Subcutaneous Daily  . lisinopril  20 mg Oral Daily  . pneumococcal 23 valent vaccine  0.5 mL Intramuscular Tomorrow-1000  . predniSONE  50 mg Oral Q breakfast  . triamterene-hydrochlorothiazide  1 capsule Oral Daily   Continuous Infusions:  PRN Meds: acetaminophen **OR** acetaminophen, albuterol, ondansetron **OR** ondansetron (ZOFRAN) IV, polyethylene glycol  Allergies:    Allergies  Allergen Reactions  . Amoxicillin Swelling    Tongue swelling   . Penicillins Swelling    Has patient had a PCN reaction causing immediate rash, facial/tongue/throat swelling, SOB or lightheadedness with hypotension: No Has patient had a PCN reaction causing severe rash involving mucus membranes or skin necrosis: No Has patient had a PCN reaction that  required hospitalization: No Has patient had a PCN reaction occurring within the last 10 years: No If all of the above answers are "NO", then may proceed with Cephalosporin use.   . Sulfa Antibiotics Swelling    Lip swelling    Social History:   Social History   Socioeconomic History  . Marital status: Divorced    Spouse name: Not on file  . Number of children: 3  . Years of education: Not on file  . Highest education level: Not on file  Occupational History  . Occupation: Retired-cook   Tobacco Use  . Smoking status: Never Smoker  . Smokeless tobacco: Never Used  Vaping Use  . Vaping Use: Never used  Substance and Sexual Activity  . Alcohol use: No  . Drug use: No  . Sexual activity: Never  Other Topics Concern  . Not on file  Social History Narrative   ** Merged History Encounter **       Social Determinants of Health  Financial Resource Strain: Not on file  Food Insecurity: Not on file  Transportation Needs: Not on file  Physical Activity: Not on file  Stress: Not on file  Social Connections: Not on file  Intimate Partner Violence: Not on file    Family History:    Family History  Problem Relation Age of Onset  . Lung disease Other   . Cancer Other   . Arthritis Other   . Asthma Other   . Diabetes Other   . Diabetes Mother   . Hypertension Mother   . Diverticulitis Mother   . Diabetes Father   . Hypertension Sister   . Diabetes Brother   . Colon cancer Neg Hx      ROS:  Please see the history of present illness.   All other ROS reviewed and negative.     Physical Exam/Data:   Vitals:   04/08/20 2058 04/08/20 2218 04/09/20 0530 04/09/20 0700  BP: (!) 159/80  118/64   Pulse: (!) 105  90   Resp: 19  20   Temp: (!) 97.1 F (36.2 C)  97.6 F (36.4 C)   SpO2: 99% 96% 96%   Weight:    89.9 kg  Height:        Intake/Output Summary (Last 24 hours) at 04/09/2020 0752 Last data filed at 04/08/2020 1831 Gross per 24 hour  Intake -  Output  300 ml  Net -300 ml   Last 3 Weights 04/09/2020 04/08/2020 02/22/2020  Weight (lbs) 198 lb 3.1 oz 200 lb 9.9 oz 201 lb 9.6 oz  Weight (kg) 89.9 kg 91 kg 91.445 kg     Body mass index is 32.98 kg/m.  General:  Well nourished, well developed female appearing in no acute distress HEENT: normal Lymph: no adenopathy Neck: no JVD Endocrine:  No thryomegaly Vascular: No carotid bruits; FA pulses 2+ bilaterally without bruits  Cardiac:  normal S1, S2; RRR; 3/6 SEM along RUSB.  Lungs:  clear to auscultation bilaterally, no wheezing, rhonchi or rales  Abd: soft, nontender, no hepatomegaly  Ext: trace lower extremity edema Musculoskeletal:  No deformities, BUE and BLE strength normal and equal Skin: warm and dry  Neuro:  CNs 2-12 intact, no focal abnormalities noted Psych:  Normal affect   EKG:  The EKG was personally reviewed and demonstrates: Sinus tachycardia, HR 114 with baseline artifact.  Telemetry:  Telemetry was personally reviewed and demonstrates: NSR, HR 70's to 80's. Strips labeled as pauses at times but most consistent with low-voltage.  Relevant CV Studies:  Echocardiogram: 02/2020 IMPRESSIONS    1. Left ventricular ejection fraction, by estimation, is 55 to 60%. The  left ventricle has normal function. The left ventricle has no regional  wall motion abnormalities. There is moderate concentric left ventricular  hypertrophy of the basal-septal  segment. Left ventricular diastolic parameters are indeterminate.  2. Right ventricular systolic function is hyperdynamic. The right  ventricular size is normal.  3. A small pericardial effusion is present. Pericardial fat pad is  prominent.  4. The mitral valve is myxomatous. Trivial mitral valve regurgitation.  Mild to moderate mitral stenosis. The mean mitral valve gradient is, on  average 4.8 mmHg (assessed with four beats) with average heart rate of 80  bpm. Moderate mitral annular  calcification.  5. The aortic valve  was not well visualized. There is moderate  calcification of the aortic valve. There is moderate thickening of the  aortic valve. Aortic valve regurgitation is mild. Mild to moderate aortic  valve stenosis. Aortic valve area, by VTI  measures 0.89 cm. Aortic valve mean gradient measures 22.7 mmHg. Aortic  valve Vmax measures 2.98 m/s. Diminished left ventricular stroke volume  index < 35.   Comparison(s): A prior study was performed on 06/12/19. Prior images  reviewed side by side. Similar aortic parameters from prior. Differential  includes paradoxical low flow low gradient aortic stenosis; consider low  dose dobutamine stress echo if  clinically indicated.   Laboratory Data:  High Sensitivity Troponin:   Recent Labs  Lab 04/08/20 1144 04/08/20 1507 04/08/20 2102  TROPONINIHS 36* 52* 45*     Chemistry Recent Labs  Lab 04/08/20 1144  NA 137  K 4.8  CL 104  CO2 24  GLUCOSE 165*  BUN 17  CREATININE 1.00  CALCIUM 10.1  GFRNONAA 56*  ANIONGAP 9    No results for input(s): PROT, ALBUMIN, AST, ALT, ALKPHOS, BILITOT in the last 168 hours. Hematology Recent Labs  Lab 04/08/20 1144  WBC 11.1*  RBC 4.53  HGB 12.4  HCT 39.4  MCV 87.0  MCH 27.4  MCHC 31.5  RDW 15.4  PLT 208   BNP Recent Labs  Lab 04/08/20 1144  BNP 649.0*    DDimer No results for input(s): DDIMER in the last 168 hours.   Radiology/Studies:  [facility] Chest Port 1 View  Result Date: 04/08/2020 CLINICAL DATA:  Shortness of breath. EXAM: PORTABLE CHEST 1 VIEW COMPARISON:  02/09/2019. FINDINGS: Mediastinum hilar structures normal. Cardiomegaly. Mild pulmonary venous congestion. Mild bilateral interstitial prominence. Mild CHF cannot be excluded. Small scratched it tiny left pleural effusion cannot be excluded. No pneumothorax. IMPRESSION: Cardiomegaly with mild pulmonary venous congestion and mild bilateral interstitial prominence. Mild CHF cannot be excluded. Tiny left pleural effusion cannot be excluded.  Electronically Signed   By: Marcello Moores  Register   On: 04/08/2020 12:49     Assessment and Plan:   1. Acute Diastolic CHF Exacerbation - Presented with acute orthopnea, PND and dyspnea on exertion which started the night prior to admission and most consistent with flash pulmonary edema.  - Initial BNP elevated to 649. Initial and repeat HS Troponin values flat at 36, 52 and 45. COVID negative. CXR showed cardiomegaly with mild pulmonary venous congestion and mild bilateral interstitial prominence. EKG showed sinus tachycardia, HR 114 with baseline artifact. - She has been started on Lasix 57m daily. Will obtain a repeat BMET this AM. If renal function stable, would titrate to BID dosing. Follow I&O's along with daily weights. She was on HCTZ 2472mdaily prior to admission so anticipate this will need to be switched to Lasix or at minimum have an Rx for PRN Lasix at discharge. Reviewed the importance of monitoring her sodium and fluid consumption.   2. Aortic Stenosis - Moderate by most recent echo with mean gradient at 22 mmHg and evaluated by Dr. McAngelena Formast month with repeat echo recommended in 6 months. Will get a limited echo study today for reassessment of aortic valve and gradients.  - Discussed with Dr. AkPaticia Stacknd he recommended reaching out [facility] with plans for outpatient management.   3. HTN - BP initially elevated at 182/111 while in the ED, improved to 143/70 on most recent check. She has been continued on Amlodipine 72m73maily, Lisinopril 24m10mily and Triamterene-HCTZ 37.5-272mg daily.   4. COPD - Further management per admitting team. Receiving Prednisone 50mg16m PRN Albuterol.  For questions or updates, please contact [place] Please consult [email or web address] for contact info under    Signed, Erma Heritage, PA-C  04/09/2020 7:52 AM

## 2020-04-09 NOTE — Progress Notes (Signed)
*  PRELIMINARY RESULTS* Echocardiogram 2D Echocardiogram Limited has been performed.  Leavy Cella 04/09/2020, 10:36 AM

## 2020-04-10 ENCOUNTER — Encounter (HOSPITAL_COMMUNITY)
Admission: EM | Disposition: A | Discharge status: Home / Self Care | Payer: Self-pay | Source: Home / Self Care | Attending: Emergency Medicine

## 2020-04-10 DIAGNOSIS — I5031 Acute diastolic (congestive) heart failure: Secondary | ICD-10-CM | POA: Diagnosis not present

## 2020-04-10 DIAGNOSIS — J449 Chronic obstructive pulmonary disease, unspecified: Secondary | ICD-10-CM | POA: Diagnosis not present

## 2020-04-10 DIAGNOSIS — I350 Nonrheumatic aortic (valve) stenosis: Secondary | ICD-10-CM | POA: Diagnosis not present

## 2020-04-10 DIAGNOSIS — I2510 Atherosclerotic heart disease of native coronary artery without angina pectoris: Secondary | ICD-10-CM | POA: Diagnosis not present

## 2020-04-10 HISTORY — PX: RIGHT/LEFT HEART CATH AND CORONARY ANGIOGRAPHY: CATH118266

## 2020-04-10 LAB — POCT I-STAT EG7
Acid-Base Excess: 2 mmol/L (ref 0.0–2.0)
Bicarbonate: 30.7 mmol/L — ABNORMAL HIGH (ref 20.0–28.0)
Calcium, Ion: 1.41 mmol/L — ABNORMAL HIGH (ref 1.15–1.40)
HCT: 38 % (ref 36.0–46.0)
Hemoglobin: 12.9 g/dL (ref 12.0–15.0)
O2 Saturation: 73 %
Potassium: 5.6 mmol/L — ABNORMAL HIGH (ref 3.5–5.1)
Sodium: 136 mmol/L (ref 135–145)
TCO2: 33 mmol/L — ABNORMAL HIGH (ref 22–32)
pCO2, Ven: 65.2 mmHg — ABNORMAL HIGH (ref 44.0–60.0)
pH, Ven: 7.281 (ref 7.250–7.430)
pO2, Ven: 45 mmHg (ref 32.0–45.0)

## 2020-04-10 LAB — CBC
HCT: 38.1 % (ref 36.0–46.0)
Hemoglobin: 11.8 g/dL — ABNORMAL LOW (ref 12.0–15.0)
MCH: 27.5 pg (ref 26.0–34.0)
MCHC: 31 g/dL (ref 30.0–36.0)
MCV: 88.8 fL (ref 80.0–100.0)
Platelets: 216 10*3/uL (ref 150–400)
RBC: 4.29 MIL/uL (ref 3.87–5.11)
RDW: 15.5 % (ref 11.5–15.5)
WBC: 12.2 10*3/uL — ABNORMAL HIGH (ref 4.0–10.5)
nRBC: 0 % (ref 0.0–0.2)

## 2020-04-10 LAB — GLUCOSE, CAPILLARY
Glucose-Capillary: 104 mg/dL — ABNORMAL HIGH (ref 70–99)
Glucose-Capillary: 154 mg/dL — ABNORMAL HIGH (ref 70–99)
Glucose-Capillary: 159 mg/dL — ABNORMAL HIGH (ref 70–99)
Glucose-Capillary: 219 mg/dL — ABNORMAL HIGH (ref 70–99)

## 2020-04-10 LAB — MAGNESIUM: Magnesium: 1.9 mg/dL (ref 1.7–2.4)

## 2020-04-10 LAB — POCT I-STAT 7, (LYTES, BLD GAS, ICA,H+H)
Acid-Base Excess: 2 mmol/L (ref 0.0–2.0)
Bicarbonate: 30.1 mmol/L — ABNORMAL HIGH (ref 20.0–28.0)
Calcium, Ion: 1.35 mmol/L (ref 1.15–1.40)
HCT: 37 % (ref 36.0–46.0)
Hemoglobin: 12.6 g/dL (ref 12.0–15.0)
O2 Saturation: 99 %
Potassium: 5.3 mmol/L — ABNORMAL HIGH (ref 3.5–5.1)
Sodium: 136 mmol/L (ref 135–145)
TCO2: 32 mmol/L (ref 22–32)
pCO2 arterial: 62.1 mmHg — ABNORMAL HIGH (ref 32.0–48.0)
pH, Arterial: 7.294 — ABNORMAL LOW (ref 7.350–7.450)
pO2, Arterial: 140 mmHg — ABNORMAL HIGH (ref 83.0–108.0)

## 2020-04-10 LAB — BASIC METABOLIC PANEL
Anion gap: 12 (ref 5–15)
BUN: 40 mg/dL — ABNORMAL HIGH (ref 8–23)
CO2: 27 mmol/L (ref 22–32)
Calcium: 9.9 mg/dL (ref 8.9–10.3)
Chloride: 99 mmol/L (ref 98–111)
Creatinine, Ser: 1.39 mg/dL — ABNORMAL HIGH (ref 0.44–1.00)
GFR, Estimated: 38 mL/min — ABNORMAL LOW (ref >60)
Glucose, Bld: 104 mg/dL — ABNORMAL HIGH (ref 70–99)
Potassium: 4.9 mmol/L (ref 3.5–5.1)
Sodium: 138 mmol/L (ref 135–145)

## 2020-04-10 SURGERY — RIGHT/LEFT HEART CATH AND CORONARY ANGIOGRAPHY
Anesthesia: LOCAL

## 2020-04-10 MED ORDER — ASPIRIN 81 MG PO CHEW: 81.0000 mg | CHEWABLE_TABLET | ORAL | Status: DC

## 2020-04-10 MED ORDER — HEPARIN SODIUM (PORCINE) 1000 UNIT/ML IJ SOLN
INTRAMUSCULAR | Status: AC
  Filled 2020-04-10: qty 1

## 2020-04-10 MED ORDER — SODIUM CHLORIDE 0.9% FLUSH: 3.0000 mL | INTRAVENOUS | Status: DC | PRN

## 2020-04-10 MED ORDER — HEPARIN (PORCINE) IN NACL 1000-0.9 UT/500ML-% IV SOLN
INTRAVENOUS | Status: AC
  Filled 2020-04-10: qty 500

## 2020-04-10 MED ORDER — ACETAMINOPHEN 325 MG PO TABS: 650.0000 mg | ORAL_TABLET | ORAL | Status: DC | PRN

## 2020-04-10 MED ORDER — LIDOCAINE HCL (PF) 1 % IJ SOLN
INTRAMUSCULAR | Status: DC | PRN
  Administered 2020-04-10 (×2): 2 mL via SUBCUTANEOUS

## 2020-04-10 MED ORDER — MIDAZOLAM HCL 2 MG/2ML IJ SOLN
INTRAMUSCULAR | Status: AC
  Filled 2020-04-10: qty 2

## 2020-04-10 MED ORDER — FENTANYL CITRATE (PF) 100 MCG/2ML IJ SOLN
INTRAMUSCULAR | Status: DC | PRN
  Administered 2020-04-10 (×2): 25 ug via INTRAVENOUS

## 2020-04-10 MED ORDER — SODIUM CHLORIDE 0.9 % IV SOLN: 250.0000 mL | INTRAVENOUS | Status: DC | PRN

## 2020-04-10 MED ORDER — HEPARIN (PORCINE) IN NACL 1000-0.9 UT/500ML-% IV SOLN
INTRAVENOUS | Status: AC
  Filled 2020-04-10: qty 1000

## 2020-04-10 MED ORDER — ONDANSETRON HCL 4 MG/2ML IJ SOLN: 4.0000 mg | Freq: Four times a day (QID) | INTRAMUSCULAR | Status: DC | PRN

## 2020-04-10 MED ORDER — FENTANYL CITRATE (PF) 100 MCG/2ML IJ SOLN
INTRAMUSCULAR | Status: AC
  Filled 2020-04-10: qty 2

## 2020-04-10 MED ORDER — LABETALOL HCL 5 MG/ML IV SOLN: 10.0000 mg | INTRAVENOUS | Status: AC | PRN

## 2020-04-10 MED ORDER — HEPARIN SODIUM (PORCINE) 1000 UNIT/ML IJ SOLN
INTRAMUSCULAR | Status: DC | PRN
  Administered 2020-04-10: 4500 [IU] via INTRAVENOUS

## 2020-04-10 MED ORDER — SODIUM CHLORIDE 0.9 % IV SOLN: INTRAVENOUS | Status: AC

## 2020-04-10 MED ORDER — SODIUM CHLORIDE 0.9 % IV SOLN: INTRAVENOUS | Status: DC

## 2020-04-10 MED ORDER — ASPIRIN 81 MG PO CHEW
81.0000 mg | CHEWABLE_TABLET | ORAL | Status: AC
  Administered 2020-04-10: 81 mg via ORAL
  Filled 2020-04-10: qty 1

## 2020-04-10 MED ORDER — LIDOCAINE HCL (PF) 1 % IJ SOLN
INTRAMUSCULAR | Status: AC
  Filled 2020-04-10: qty 30

## 2020-04-10 MED ORDER — PNEUMOCOCCAL VAC POLYVALENT 25 MCG/0.5ML IJ INJ
0.5000 mL | INJECTION | INTRAMUSCULAR | Status: AC
  Administered 2020-04-11: 0.5 mL via INTRAMUSCULAR
  Filled 2020-04-10: qty 0.5

## 2020-04-10 MED ORDER — SODIUM CHLORIDE 0.9% FLUSH
3.0000 mL | INTRAVENOUS | Status: DC | PRN
Start: 2020-04-10 — End: 2020-04-11

## 2020-04-10 MED ORDER — IOHEXOL 350 MG/ML SOLN
INTRAVENOUS | Status: DC | PRN
  Administered 2020-04-10: 50 mL via INTRA_ARTERIAL

## 2020-04-10 MED ORDER — HEPARIN (PORCINE) IN NACL 1000-0.9 UT/500ML-% IV SOLN
INTRAVENOUS | Status: DC | PRN
  Administered 2020-04-10 (×3): 500 mL

## 2020-04-10 MED ORDER — VERAPAMIL HCL 2.5 MG/ML IV SOLN
INTRAVENOUS | Status: DC | PRN
  Administered 2020-04-10: 10 mL via INTRA_ARTERIAL

## 2020-04-10 MED ORDER — SODIUM CHLORIDE 0.9% FLUSH
3.0000 mL | Freq: Two times a day (BID) | INTRAVENOUS | Status: DC
  Administered 2020-04-10 – 2020-04-11 (×2): 3 mL via INTRAVENOUS

## 2020-04-10 MED ORDER — HYDRALAZINE HCL 20 MG/ML IJ SOLN: 10.0000 mg | INTRAMUSCULAR | Status: AC | PRN

## 2020-04-10 MED ORDER — MIDAZOLAM HCL 2 MG/2ML IJ SOLN
INTRAMUSCULAR | Status: DC | PRN
  Administered 2020-04-10 (×2): 1 mg via INTRAVENOUS

## 2020-04-10 MED ORDER — VERAPAMIL HCL 2.5 MG/ML IV SOLN
INTRAVENOUS | Status: AC
  Filled 2020-04-10: qty 2

## 2020-04-10 MED ORDER — SODIUM CHLORIDE 0.9% FLUSH: 3.0000 mL | Freq: Two times a day (BID) | INTRAVENOUS | Status: DC

## 2020-04-10 SURGICAL SUPPLY — 13 items
CATH 5FR JL3.5 JR4 ANG PIG MP (CATHETERS) ×2 IMPLANT
CATH SWAN GANZ 7F STRAIGHT (CATHETERS) ×2 IMPLANT
DEVICE RAD COMP TR BAND LRG (VASCULAR PRODUCTS) ×2 IMPLANT
GLIDESHEATH SLEND SS 6F .021 (SHEATH) ×2 IMPLANT
GLIDESHEATH SLENDER 7FR .021G (SHEATH) ×2 IMPLANT
GUIDEWIRE .025 260CM (WIRE) ×2 IMPLANT
GUIDEWIRE INQWIRE 1.5J.035X260 (WIRE) ×1 IMPLANT
INQWIRE 1.5J .035X260CM (WIRE) ×2
KIT HEART LEFT (KITS) ×2 IMPLANT
PACK CARDIAC CATHETERIZATION (CUSTOM PROCEDURE TRAY) ×2 IMPLANT
SHEATH PROBE COVER 6X72 (BAG) ×2 IMPLANT
TRANSDUCER W/STOPCOCK (MISCELLANEOUS) ×2 IMPLANT
TUBING CIL FLEX 10 FLL-RA (TUBING) ×2 IMPLANT

## 2020-04-10 NOTE — Progress Notes (Addendum)
Pt arrived to rm 13 from [facility]. Initiated tele. CHG wipe performed. Consent form obtained. Call bell within reach. Called report to cath lab nurse.  Lavenia Atlas, RN

## 2020-04-10 NOTE — Progress Notes (Signed)
Pt came back to rm 13 from cath lab. Reinitiated tele. VSS. Call bell within reach.   Lavenia Atlas, RN

## 2020-04-10 NOTE — Interval H&P Note (Signed)
Cath Lab Visit (complete for each Cath Lab visit)  Clinical Evaluation Leading to the Procedure:   ACS: Yes.    Non-ACS:    Anginal Classification: CCS IV  Anti-ischemic medical therapy: Minimal Therapy (1 class of medications)  Non-Invasive Test Results: No non-invasive testing performed  Prior CABG: No previous CABG   ? TAVR   History and Physical Interval Note:  04/10/2020 2:31 PM  Stephanie [name]  has presented today for surgery, with the diagnosis of aortic stenosis.  The various methods of treatment have been discussed with the patient and family. After consideration of risks, benefits and other options for treatment, the patient has consented to  Procedure(s): RIGHT/LEFT HEART CATH AND CORONARY ANGIOGRAPHY (N/A) as a surgical intervention.  The patient's history has been reviewed, patient examined, no change in status, stable for surgery.  I have reviewed the patient's chart and labs.  Questions were answered to the patient's satisfaction.     Larae Grooms

## 2020-04-10 NOTE — Progress Notes (Signed)
Received s/o yesterday that patient would be coming from [facility] for [facility] and structural heart team assessment. I spoke with cath lab and they will likely call for her from [facility] directly to cath lab. I spoke with Joellen Jersey PA with our structural team - structural MD is not scheduled for today in the hospital but their team will be following behind the scenes for the cath report to discuss next steps. Katie requests that patient land on one of our general cardiology teams to follow when she is here at [facility]. Elijah Phommachanh PA-C

## 2020-04-10 NOTE — Progress Notes (Signed)
Nsg Discharge Note  Admit Date:  04/08/2020 Discharge date: 04/10/2020   Stephanie [name] to be transferred to [facility] per MD order.  AVS completed.  Copy for chart, and copy for patient signed, and dated.  Patient/caregiver able to verbalize understanding.  Discharge Medication:   Discharge Assessment: Vitals:   04/10/20 0613 04/10/20 0752  BP: (!) 104/46 138/72  Pulse:    Resp:    Temp:    SpO2:     Skin clean, dry and intact without evidence of skin break down, no evidence of skin tears noted.  D/c Instructions-Education: Discharge instructions given to patient/family with verbalized understanding. D/c education completed with patient/family including follow up instructions, medication list, d/c activities limitations if indicated, with other d/c instructions as indicated by MD - patient able to verbalize understanding, all questions fully answered.  Patient escorted via [place] to [facility] for pending heart catheterization.   Zachery Conch, RN 04/10/2020 12:04 PM

## 2020-04-10 NOTE — H&P (View-Only) (Signed)
Received s/o yesterday that patient would be coming from [facility] for [facility] and structural heart team assessment. I spoke with cath lab and they will likely call for her from [facility] directly to cath lab. I spoke with Joellen Jersey PA with our structural team - structural MD is not scheduled for today in the hospital but their team will be following behind the scenes for the cath report to discuss next steps. Katie requests that patient land on one of our general cardiology teams to follow when she is here at [facility]. Andrei Mccook PA-C

## 2020-04-10 NOTE — Progress Notes (Addendum)
Progress Note  Patient Name: Stephanie [name] Date of Encounter: 04/10/2020  [facility] Cardiologist: Rozann Lesches, MD   Subjective   Breathing improved. No chest pain or palpitations. She has been NPO since midnight in preparation for her cardiac catheterization.    Inpatient Medications    Scheduled Meds: . amLODipine  5 mg Oral Daily  . atorvastatin  20 mg Oral QPM  . enoxaparin (LOVENOX) injection  40 mg Subcutaneous Q24H  . gabapentin  100-200 mg Oral QHS  . hydrALAZINE  25 mg Oral Q8H  . insulin aspart  0-15 Units Subcutaneous TID WC  . insulin aspart  0-5 Units Subcutaneous QHS  . insulin detemir  32 Units Subcutaneous Daily  . pneumococcal 23 valent vaccine  0.5 mL Intramuscular Tomorrow-1000  . predniSONE  50 mg Oral Q breakfast   Continuous Infusions: . sodium chloride 50 mL/hr at 04/10/20 0759   PRN Meds: acetaminophen **OR** acetaminophen, albuterol, ondansetron **OR** ondansetron (ZOFRAN) IV, polyethylene glycol   Vital Signs    Vitals:   04/09/20 2013 04/10/20 0318 04/10/20 0613 04/10/20 0752  BP: 106/76 (!) 110/45 (!) 104/46 138/72  Pulse: 66 67    Resp: 20 20    Temp: (!) 97.1 F (36.2 C) (!) 97.3 F (36.3 C)    TempSrc:      SpO2: 100% 99%    Weight:      Height:        Intake/Output Summary (Last 24 hours) at 04/10/2020 0834 Last data filed at 04/09/2020 2300 Gross per 24 hour  Intake 290 ml  Output 700 ml  Net -410 ml   Last 3 Weights 04/09/2020 04/08/2020 02/22/2020  Weight (lbs) 198 lb 3.1 oz 200 lb 9.9 oz 201 lb 9.6 oz  Weight (kg) 89.9 kg 91 kg 91.445 kg      Telemetry    NSR, HR in 50's to 60's. No significant arrhythmias.  - Personally Reviewed  ECG    No new tracings.   Physical Exam   GEN: No acute distress.   Neck: No JVD Cardiac: RRR, 3/6 SEM along RUSB.  Respiratory: Clear to auscultation bilaterally without wheezing or rales. GI: Soft, nontender, non-distended  MS: No lower extremity edema; No  deformity. Neuro:  Nonfocal  Psych: Normal affect   Labs    High Sensitivity Troponin:   Recent Labs  Lab 04/08/20 1144 04/08/20 1507 04/08/20 2102  TROPONINIHS 36* 52* 45*      Chemistry Recent Labs  Lab 04/08/20 1144 04/09/20 0914 04/10/20 0550  NA 137 136 138  K 4.8 5.3* 4.9  CL 104 101 99  CO2 24 26 27   GLUCOSE 165* 252* 104*  BUN 17 30* 40*  CREATININE 1.00 1.44* 1.39*  CALCIUM 10.1 9.8 9.9  GFRNONAA 56* 36* 38*  ANIONGAP 9 9 12      Hematology Recent Labs  Lab 04/08/20 1144 04/10/20 0550  WBC 11.1* 12.2*  RBC 4.53 4.29  HGB 12.4 11.8*  HCT 39.4 38.1  MCV 87.0 88.8  MCH 27.4 27.5  MCHC 31.5 31.0  RDW 15.4 15.5  PLT 208 216    BNP Recent Labs  Lab 04/08/20 1144  BNP 649.0*     DDimer No results for input(s): DDIMER in the last 168 hours.   Radiology    US Venous Img Lower Unilateral Left (DVT)  Result Date: 04/09/2020 CLINICAL DATA:  85 year old female with history of left lower extremity swelling. EXAM: LEFT LOWER EXTREMITY VENOUS DOPPLER ULTRASOUND TECHNIQUE: Gray-scale sonography  with graded compression, as well as color Doppler and duplex ultrasound were performed to evaluate the left lower extremity deep venous systems from the level of the common femoral vein and including the common femoral, femoral, profunda femoral, popliteal and calf veins including the posterior tibial, peroneal and gastrocnemius veins when visible. Spectral Doppler was utilized to evaluate flow at rest and with distal augmentation maneuvers in the common femoral, femoral and popliteal veins. The contralateral common femoral vein was also evaluated for comparison. COMPARISON:  None. FINDINGS: LEFT LOWER EXTREMITY Common Femoral Vein: No evidence of thrombus. Normal compressibility, respiratory phasicity and response to augmentation. Central Greater Saphenous Vein: No evidence of thrombus. Normal compressibility and flow on color Doppler imaging. Central Profunda Femoral  Vein: No evidence of thrombus. Normal compressibility and flow on color Doppler imaging. Femoral Vein: No evidence of thrombus. Normal compressibility, respiratory phasicity and response to augmentation. Popliteal Vein: No evidence of thrombus. Normal compressibility, respiratory phasicity and response to augmentation. Calf Veins: No evidence of thrombus. Normal compressibility and flow on color Doppler imaging. Other Findings:  None. RIGHT LOWER EXTREMITY Common Femoral Vein: No evidence of thrombus. Normal compressibility, respiratory phasicity and response to augmentation. IMPRESSION: No evidence of left lower extremity deep venous thrombosis. Ruthann Cancer, MD [facility] Radiology Electronically Signed   By: Ruthann Cancer MD   On: 04/09/2020 12:29   DG Chest Port 1 View  Result Date: 04/08/2020 CLINICAL DATA:  Shortness of breath. EXAM: PORTABLE CHEST 1 VIEW COMPARISON:  02/09/2019. FINDINGS: Mediastinum hilar structures normal. Cardiomegaly. Mild pulmonary venous congestion. Mild bilateral interstitial prominence. Mild CHF cannot be excluded. Small scratched it tiny left pleural effusion cannot be excluded. No pneumothorax. IMPRESSION: Cardiomegaly with mild pulmonary venous congestion and mild bilateral interstitial prominence. Mild CHF cannot be excluded. Tiny left pleural effusion cannot be excluded. Electronically Signed   By: Marcello Moores  Register   On: 04/08/2020 12:49    Cardiac Studies   Limited Echo: 03/2020 IMPRESSIONS    1. Left ventricular ejection fraction, by estimation, is 55 to 60%. The  left ventricle has normal function. Left ventricular diastolic parameters  are consistent with Grade II diastolic dysfunction (pseudonormalization).  Elevated left atrial pressure.  2. The pericardial effusion is circumferential.  3. Moderate mitral annular calcification.  4. Moderate aortic stenosis by gradient, severe by AVA VTI. Borderline   dimensionless index for severe at 0.26. Lower stroke volume index likely  contributing to lower than expected gradient. Essentially moderate to  severe AS more toward the severe side  of the spectrum.. The aortic valve is tricuspid. There is moderate  calcification of the aortic valve. There is moderate thickening of the  aortic valve.   Patient Profile     85 y.o. female w/PMH of HTN, HLD, IDDM, COPD,anemia and aortic stenosis who is currently admitted for an acute CHF exacerbation.   Assessment & Plan    1. Acute Diastolic CHF Exacerbation - Presented with acute orthopnea, PND and dyspnea on exertion which started the night prior to admission and most consistent with flash pulmonary edema. BNP at 649 on admission and CXR showed cardiomegaly with mild pulmonary venous congestion and mild bilateral interstitial prominence.  - Was initially on Lasix 40mg  daily and I&O's not fully recorded. Her breathing has significantly improved and further Lasix is currently held given her rising creatinine and plans for cardiac catheterization. Will likely need script for at minimum PRN Lasix at discharge (on HCTZ prior to admission).   2.  Aortic Stenosis - Moderate by most recent echo with mean gradient at 22 mmHg and evaluated by Dr. Angelena Form last month with repeat echo recommended in 6 months. Repeat limited study yesterday suggested more severe AS. Dr. Paticia Stack reviewed her case with Dr. Angelena Form yesterday and given her episode of flash pulmonary edema, will plan for additional evaluation with [facility] this admission. Scheduled for later today. Risks and benefits reviewed and previously documented and she is in agreement to proceed. Structural Heart Team aware as well.   3. HTN - BP initially elevated at 182/111 while in the ED but has been well-controlled at 99/70 - 143/70 within the past 24 hours. She has remained on Amlodipine 5mg  daily but PTA Lisinopril 20mg  daily and Triamterene-HCTZ 37.5-25mg  daily  have been held given her AKI. Currently on Hydralazine 25mg  TID until renal function normalizes.    4. Asthma Exacerbation - Being followed by the admitting team and on Prednisone 50mg  and PRN Albuterol. Will touch base with the admitting team to see how long they wish for her to remain on Prednisone.    ADDENDUM: Reviewed with Hospitalist who recommended 5 days of treatment with Prednisone (Day 2/5 as of 3/24).   5. Stage 3 CKD - Baseline creatinine 1.1 - 1.2. Creatinine at 1.00 on admission and peaked at 1.44 on 3/23. Improved to 1.39 today. Holding additional Lasix along with PTA Lisinopril and HCTZ for now. Did review with the cath lab and will give 50 mL/hr IVF prior to her procedure.    For questions or updates, please contact [place] Please consult [email or web address] for contact info under        Signed, Erma Heritage, PA-C  04/10/2020, 8:34 AM

## 2020-04-11 ENCOUNTER — Encounter (HOSPITAL_COMMUNITY): Payer: Self-pay | Admitting: Interventional Cardiology

## 2020-04-11 ENCOUNTER — Observation Stay (HOSPITAL_COMMUNITY): Payer: Medicare HMO

## 2020-04-11 ENCOUNTER — Other Ambulatory Visit: Payer: Self-pay | Admitting: Physician Assistant

## 2020-04-11 ENCOUNTER — Observation Stay (HOSPITAL_BASED_OUTPATIENT_CLINIC_OR_DEPARTMENT_OTHER): Payer: Medicare HMO

## 2020-04-11 DIAGNOSIS — N390 Urinary tract infection, site not specified: Secondary | ICD-10-CM

## 2020-04-11 DIAGNOSIS — Z01810 Encounter for preprocedural cardiovascular examination: Secondary | ICD-10-CM

## 2020-04-11 DIAGNOSIS — I350 Nonrheumatic aortic (valve) stenosis: Secondary | ICD-10-CM

## 2020-04-11 DIAGNOSIS — N179 Acute kidney failure, unspecified: Secondary | ICD-10-CM

## 2020-04-11 LAB — BASIC METABOLIC PANEL
Anion gap: 5 (ref 5–15)
BUN: 33 mg/dL — ABNORMAL HIGH (ref 8–23)
CO2: 29 mmol/L (ref 22–32)
Calcium: 10.3 mg/dL (ref 8.9–10.3)
Chloride: 101 mmol/L (ref 98–111)
Creatinine, Ser: 1.24 mg/dL — ABNORMAL HIGH (ref 0.44–1.00)
GFR, Estimated: 43 mL/min — ABNORMAL LOW (ref >60)
Glucose, Bld: 122 mg/dL — ABNORMAL HIGH (ref 70–99)
Potassium: 4.2 mmol/L (ref 3.5–5.1)
Sodium: 135 mmol/L (ref 135–145)

## 2020-04-11 LAB — GLUCOSE, CAPILLARY
Glucose-Capillary: 145 mg/dL — ABNORMAL HIGH (ref 70–99)
Glucose-Capillary: 134 mg/dL — ABNORMAL HIGH (ref 70–99)

## 2020-04-11 LAB — LIPID PANEL
Cholesterol: 184 mg/dL (ref 0–200)
HDL: 46 mg/dL (ref >40)
LDL Cholesterol: 81 mg/dL (ref 0–99)
Total CHOL/HDL Ratio: 4 RATIO
Triglycerides: 283 mg/dL — ABNORMAL HIGH (ref <150)
VLDL: 57 mg/dL — ABNORMAL HIGH (ref 0–40)

## 2020-04-11 MED ORDER — PREDNISONE 20 MG PO TABS: 40.0000 mg | ORAL_TABLET | Freq: Every day | ORAL | 0 refills | Status: DC

## 2020-04-11 MED ORDER — PREDNISONE 20 MG PO TABS: 40.0000 mg | ORAL_TABLET | Freq: Every day | ORAL | Status: DC

## 2020-04-11 MED ORDER — HYDRALAZINE HCL 25 MG PO TABS: 25.0000 mg | ORAL_TABLET | Freq: Three times a day (TID) | ORAL | 6 refills | Status: DC

## 2020-04-11 MED ORDER — FUROSEMIDE 20 MG PO TABS: 20.0000 mg | ORAL_TABLET | Freq: Every day | ORAL | 3 refills | Status: DC

## 2020-04-11 MED ORDER — METFORMIN HCL 500 MG PO TABS: 500.0000 mg | ORAL_TABLET | Freq: Two times a day (BID) | ORAL | Status: DC

## 2020-04-11 MED FILL — FUROSEMIDE 20 MG TAB: 20 | 90 days supply | Qty: 90 | Fill #0

## 2020-04-11 MED FILL — predniSONE 20 MG TABS: 20 | 1 days supply | Qty: 2 | Fill #0

## 2020-04-11 MED FILL — hydrALAZINE HCL 25 MG TABS: 25 | 30 days supply | Qty: 90 | Fill #0

## 2020-04-11 NOTE — Progress Notes (Signed)
Carotid duplex bilateral study completed.   Please see CV Proc for preliminary results.   Zya Finkle, RDMS, RVT  

## 2020-04-11 NOTE — Progress Notes (Addendum)
Progress Note  Patient Name: Stephanie [name] Date of Encounter: 04/11/2020  [facility] Cardiologist: Rozann Lesches, MD   Subjective   Pt feels well, breathing better, leg edema largely resolved. She is anxious to discharge home.  Structural Heart Team planning CTA today.  Inpatient Medications    Scheduled Meds: . amLODipine  5 mg Oral Daily  . atorvastatin  20 mg Oral QPM  . enoxaparin (LOVENOX) injection  40 mg Subcutaneous Q24H  . gabapentin  100-200 mg Oral QHS  . hydrALAZINE  25 mg Oral Q8H  . insulin aspart  0-15 Units Subcutaneous TID WC  . insulin aspart  0-5 Units Subcutaneous QHS  . insulin detemir  32 Units Subcutaneous Daily  . [START ON 04/12/2020] predniSONE  40 mg Oral Q breakfast  . sodium chloride flush  3 mL Intravenous Q12H  . sodium chloride flush  3 mL Intravenous Q12H   Continuous Infusions: . sodium chloride     PRN Meds: sodium chloride, acetaminophen **OR** acetaminophen, acetaminophen, albuterol, ondansetron (ZOFRAN) IV, ondansetron **OR** [DISCONTINUED] ondansetron (ZOFRAN) IV, polyethylene glycol, sodium chloride flush   Vital Signs    Vitals:   04/10/20 2330 04/11/20 0402 04/11/20 0629 04/11/20 0852  BP: (!) 116/91 (!) 164/72  133/84  Pulse: 71 63  69  Resp: 19 20 (!) 22 20  Temp: 98 F (36.7 C) 98.2 F (36.8 C)  97.7 F (36.5 C)  TempSrc: Oral Oral  Oral  SpO2: 94% 96%  95%  Weight:   87.7 kg   Height:        Intake/Output Summary (Last 24 hours) at 04/11/2020 1038 Last data filed at 04/11/2020 0853 Gross per 24 hour  Intake 120 ml  Output -  Net 120 ml   Last 3 Weights 04/11/2020 04/09/2020 04/08/2020  Weight (lbs) 193 lb 6.4 oz 198 lb 3.1 oz 200 lb 9.9 oz  Weight (kg) 87.726 kg 89.9 kg 91 kg      Telemetry    Sinus rhythm with PVCs, HR 60-70s - Personally Reviewed  ECG    No new tracings - Personally Reviewed  Physical Exam   GEN: No acute distress.   Neck: No JVD Cardiac: RRR, 4/6 systolic murmur, no  S2 Respiratory: Clear to auscultation bilaterally. GI: Soft, nontender, non-distended  MS: minimal LE edema; No deformity. Neuro:  Nonfocal  Psych: Normal affect   Labs    High Sensitivity Troponin:   Recent Labs  Lab 04/08/20 1144 04/08/20 1507 04/08/20 2102  TROPONINIHS 36* 52* 45*      Chemistry Recent Labs  Lab 04/09/20 0914 04/10/20 0550 04/10/20 1451 04/10/20 1458 04/11/20 0945  NA 136 138 136 136 135  K 5.3* 4.9 5.3* 5.6* 4.2  CL 101 99  --   --  101  CO2 26 27  --   --  29  GLUCOSE 252* 104*  --   --  122*  BUN 30* 40*  --   --  33*  CREATININE 1.44* 1.39*  --   --  1.24*  CALCIUM 9.8 9.9  --   --  10.3  GFRNONAA 36* 38*  --   --  43*  ANIONGAP 9 12  --   --  5     Hematology Recent Labs  Lab 04/08/20 1144 04/10/20 0550 04/10/20 1451 04/10/20 1458  WBC 11.1* 12.2*  --   --   RBC 4.53 4.29  --   --   HGB 12.4 11.8* 12.6 12.9  HCT  39.4 38.1 37.0 38.0  MCV 87.0 88.8  --   --   MCH 27.4 27.5  --   --   MCHC 31.5 31.0  --   --   RDW 15.4 15.5  --   --   PLT 208 216  --   --     BNP Recent Labs  Lab 04/08/20 1144  BNP 649.0*     DDimer No results for input(s): DDIMER in the last 168 hours.   Radiology    CARDIAC CATHETERIZATION  Result Date: 04/10/2020  Mid Cx lesion is 50% stenosed.  LV end diastolic pressure is mildly elevated.  There is mild aortic valve stenosis. Calculated valve area 1.56 cm2.  Ao sat 98%, PA sat 73%, PA pressure 39/5, mean PA pressure 19 mm Hg; mean PCWP 14 mm Hg; CO 6.5 L/min; CI 3.3.  Small radial artery. Ulnar access obtained. Vasospasm noted.  Nonobstructive CAD.  Mild aortic stenosis.  Will hydrate gently post cath given renal insufficiency.  Ultimately, may need diuresis.    US Venous Img Lower Unilateral Left (DVT)  Result Date: 04/09/2020 CLINICAL DATA:  85 year old female with history of left lower extremity swelling. EXAM: LEFT LOWER EXTREMITY VENOUS DOPPLER ULTRASOUND TECHNIQUE: Gray-scale sonography  with graded compression, as well as color Doppler and duplex ultrasound were performed to evaluate the left lower extremity deep venous systems from the level of the common femoral vein and including the common femoral, femoral, profunda femoral, popliteal and calf veins including the posterior tibial, peroneal and gastrocnemius veins when visible. Spectral Doppler was utilized to evaluate flow at rest and with distal augmentation maneuvers in the common femoral, femoral and popliteal veins. The contralateral common femoral vein was also evaluated for comparison. COMPARISON:  None. FINDINGS: LEFT LOWER EXTREMITY Common Femoral Vein: No evidence of thrombus. Normal compressibility, respiratory phasicity and response to augmentation. Central Greater Saphenous Vein: No evidence of thrombus. Normal compressibility and flow on color Doppler imaging. Central Profunda Femoral Vein: No evidence of thrombus. Normal compressibility and flow on color Doppler imaging. Femoral Vein: No evidence of thrombus. Normal compressibility, respiratory phasicity and response to augmentation. Popliteal Vein: No evidence of thrombus. Normal compressibility, respiratory phasicity and response to augmentation. Calf Veins: No evidence of thrombus. Normal compressibility and flow on color Doppler imaging. Other Findings:  None. RIGHT LOWER EXTREMITY Common Femoral Vein: No evidence of thrombus. Normal compressibility, respiratory phasicity and response to augmentation. IMPRESSION: No evidence of left lower extremity deep venous thrombosis. Ruthann Cancer, MD [facility] Radiology Electronically Signed   By: Ruthann Cancer MD   On: 04/09/2020 12:29    Cardiac Studies   Right and left heart cath 04/10/20:  Mid Cx lesion is 50% stenosed.  LV end diastolic pressure is mildly elevated.  There is mild aortic valve stenosis. Calculated valve area 1.56 cm2.  Ao sat 98%, PA sat 73%, PA pressure  39/5, mean PA pressure 19 mm Hg; mean PCWP 14 mm Hg; CO 6.5 L/min; CI 3.3.  Small radial artery. Ulnar access obtained. Vasospasm noted.   Nonobstructive CAD.  Mild aortic stenosis.    Will hydrate gently post cath given renal insufficiency.  Ultimately, may need diuresis.     Echo 04/09/20: 1. Left ventricular ejection fraction, by estimation, is 55 to 60%. The  left ventricle has normal function. Left ventricular diastolic parameters  are consistent with Grade II diastolic dysfunction (pseudonormalization).  Elevated left atrial pressure.  2. The pericardial effusion is circumferential.  3. Moderate mitral annular calcification.  4. Moderate aortic stenosis by gradient, severe by AVA VTI. Borderline  dimensionless index for severe at 0.26. Lower stroke volume index likely  contributing to lower than expected gradient. Essentially moderate to  severe AS more toward the severe side  of the spectrum.. The aortic valve is tricuspid. There is moderate  calcification of the aortic valve. There is moderate thickening of the  aortic valve.    Patient Profile     85 y.o. female with PMH significant for HTN, HLD, IDDM, COPD, asthma, and aortic stenosis who was admitted for acute diastolic heart failure/flash pulmonary edema.  Assessment & Plan    Aortic stenosis - cath yesterday described as mild aortic stenosis with calculated valve area of 1.56 cm2 - structural heart team planning for cardiac CT and CT angiography pending renal function today   Acute on chronic diastolic heart failure - wedge pressure slightly up at 14 mmHg - she was gently hydrated following heart cath - 50 ml/hr x 8 hrs - diuresis prior to cath was 40 mg lasix daily, but discontinued due to renal function - will hold on diuresis today given planned CT scan - will likely discharge on 40 mg lasix with close follow up   CAD - right and left heart cath yesterday showed 50% stenosis in the mid LCx - risk  factor modification - not currently on ASA - will discuss with Dr. Marlou Porch   Hypertension - home regimen included 20 mg lisinopril and triamterene-HCTZ 37.5-25 mg - both held due to renal function - now on amlodipine and hydralazine   Acute on chronic kidney insufficiency - sCr now 1.36 (1.44) - ACEI and triamterene held  - BMP pending   Hyperlipidemia with LDL goal < 70 - given nonobstructive CAD on cath, will want LDL below 70 - currently on 20 mg lipitor - will collect a lipid profile - won't be fasting   Asthma exacerbation - hospitalist at [facility] recommended 5 days of prednisone (last dose 04/12/20) - dose adjusted to 40 mg, their recommendations   IDDM - continue insulin regimen - BG running mildly high, but also on prednisone - will hold off on adjustments to her levemir for now   Per Structural Heart Team - OK to discharged after CT scan from their standpoint. Pt feels much better today, cath site covered, no bleeding. I will arrange follow up with BMP in one week.    For questions or updates, please contact [place] Please consult [email or web address] for contact info under        Signed, Ledora Bottcher, PA  04/11/2020, 10:38 AM    Personally seen and examined. Agree with above.   Feels well.  Awaiting further cardiac CT for TAVR evaluation and then she may be discharged home.  We will let her go on Lasix 20 mg once a day.  Appreciate structural heart team evaluation.  3/6 crescendo decrescendo murmur heard on exam.  Cardiac catheterization and echocardiogram data reviewed.  Discharge once CTs are performed.  Candee Furbish, MD

## 2020-04-11 NOTE — Progress Notes (Signed)
Pt to CT for pre-TAVR scans. Pt unable to adequately hold her breath for quality scans. There was still motion in the chest/abd when she attempted to hold her breath.  Pt reports SOB on Tuesday but had improved over course of hospitalization.  Dr. Marisue Ivan and Nell Range made aware and decision to abort scan was made.   Pt updated and verbalized understanding  Marchia Bond RN Navigator Cardiac Imaging [facility] Services [phone] Office  [phone] Cell

## 2020-04-11 NOTE — Discharge Summary (Addendum)
Discharge Summary    Patient ID: Stephanie [name] MRN: [id number]; DOB: 1935-02-09  Admit date: 04/08/2020 Discharge date: 04/11/2020  PCP:  Stephanie [name], [place]  Cardiologist:  Rozann Lesches, MD  [phone]    Discharge Diagnoses    Principal Problem:   Dyspnea Active Problems:   Essential hypertension, benign   Asthma   DM (diabetes mellitus) ([place])   Aortic stenosis    Diagnostic Studies/Procedures    Right and left heart cath 04/10/20:  Mid Cx lesion is 50% stenosed.  LV end diastolic pressure is mildly elevated.  There is mild aortic valve stenosis. Calculated valve area 1.56 cm2.  Ao sat 98%, PA sat 73%, PA pressure 39/5, mean PA pressure 19 mm Hg; mean PCWP 14 mm Hg; CO 6.5 L/min; CI 3.3.  Small radial artery. Ulnar access obtained. Vasospasm noted.  Nonobstructive CAD. Mild aortic stenosis.   Will hydrate gently post cath given renal insufficiency. Ultimately, may need diuresis.    Echo 04/09/20: 1. Left ventricular ejection fraction, by estimation, is 55 to 60%. The  left ventricle has normal function. Left ventricular diastolic parameters  are consistent with Grade II diastolic dysfunction (pseudonormalization).  Elevated left atrial pressure.  2. The pericardial effusion is circumferential.  3. Moderate mitral annular calcification.  4. Moderate aortic stenosis by gradient, severe by AVA VTI. Borderline  dimensionless index for severe at 0.26. Lower stroke volume index likely  contributing to lower than expected gradient. Essentially moderate to  severe AS more toward the severe side  of the spectrum.. The aortic valve is tricuspid. There is moderate  calcification of the aortic valve. There is moderate thickening of the  aortic valve.  _____________   History of Present Illness     Stephanie [name] is a 85 y.o. female with PMH significant for HTN, HLD, IDDM, COPD, asthma, and aortic stenosis who  was admitted for acute diastolic heart failure/flash pulmonary edema.  Stephanie [name] was last examined by Ermalinda Barrios, PA-C in 01/2020 and reported a recent hospitalization for possible CVA vs. heart attack and she was unaware of the specifics at that time. Records were obtained and showed she had been hospitalized for hypertensive urgency and CTA was negative for PE that admission. She did undergo a NST which showed no ischemia but EF was read as 29%. An echo was obtained and showed her EF at 55% with Grade 1 DD and moderate AS. Was referred to the [facility] for further evaluation. Met with Dr. Angelena Form in 02/2020 and findings were felt to be consistent with moderate AS by recent echo with mean gradient at 22 mmHg. A repeat echo was recommended in 6 months.   She presented to [facility] ED on 04/08/2020 for evaluation of worsening dyspnea and wheezing. In talking with the patient today, she reports she does experience intermittent dyspnea and wheezing and typically uses her nebulizer with improvement in her symptoms. The night before her admission, she reports having orthopnea and PND which improved with sitting on the side of her bed. Used her nebulizer but symptoms did not significantly improve. She developed worsening dyspnea the following morning while walking around her home which prompted her to call EMS. She denies any associated chest pain or palpitations. No recent dizziness or presyncope. Had noticed worsening lower extremity edema but unsure of any change in her weight. She does not add salt to her food or consume fast-food regularly.   Initial labs showed WBC  11.1, Hgb 12.4, platelets 208, Na+ 137, K+ 4.8 and creatinine 1.00. BNP 649. Initial and repeat HS Troponin values flat at 36, 52 and 45. COVID negative. CXR showed cardiomegaly with mild pulmonary venous congestion and mild bilateral interstitial prominence. EKG showed sinus tachycardia, HR 114 with baseline artifact.  She  was started on IV Lasix 51m daily and full I&O's not recorded. [facility].   Hospital Course     Consultants: Structural Heart Team  Aortic stenosis Heart cath yesterday described as mild aortic stenosis with calculated valve area of 1.56 cm2. Structural heart team has been consulted and planning for CTA prior to her discharge.    Acute on chronic diastolic heart failure Wedge pressure slightly up at 14 mmHg. She was gently hydrated following heart cath with 50 mg/hr x 8 hrs. Diuresis prior to heart cath was 40 mg lasix daily, but D/C'ed due to renal function and plan for angiography. We held diuresis on day of discharge for planned CT for TAVR. Will discharge on 20 mg lasix with BMP in one week and close follow up.    CAD  Right and left heart cath yesterday showed 50% stenosis in the mid Cx. Will focus on risk factor modification. Will add ASA at discharge.    Hypertension Home regimen included 20 mg lisinopril and triamterene-HCTZ 37.5-25 mg - both held due to renal function. She will discharge on amlodipine and hydralazine.    Acute on chronic kidney insufficiency sCr down to 1.24 today prior to CTA. ACEI and triamterene held. BMP in 1 week.    Hyperlipidemia with LDL goal < 70 Given CAD, would aim for LDL goal less than 70. Lipid panel added on today. Will likely need to increase her home 20 mg lipitor.    Asthma exacerbation Will continue 5 day prednisone - one more day of 40 mg prednisone tomorrow.   IDDM Continue insulin regimen. BG running high, but also on prednisone. I will hold off on adjustments to levimir for now.    Pt seen and examined by Dr. SMarlou Porchand deemed stable for discharge.   Did the patient have an acute coronary syndrome (MI, NSTEMI, STEMI, etc) this admission?:  No                               Did the patient have a percutaneous coronary intervention (stent / angioplasty)?:   No.       _____________  Discharge Vitals Blood pressure (!) 148/91, pulse 72, temperature (!) 97.5 F (36.4 C), temperature source Oral, resp. rate 20, height '5\' 5"'  (1.651 m), weight 87.7 kg, SpO2 94 %.  Filed Weights   04/08/20 1116 04/09/20 0700 04/11/20 0629  Weight: 91 kg 89.9 kg 87.7 kg    Labs & Radiologic Studies    CBC Recent Labs    04/10/20 0550 04/10/20 1451 04/10/20 1458  WBC 12.2*  --   --   HGB 11.8* 12.6 12.9  HCT 38.1 37.0 38.0  MCV 88.8  --   --   PLT 216  --   --    Basic Metabolic Panel Recent Labs    04/10/20 0550 04/10/20 1451 04/10/20 1458 04/11/20 0945  NA 138   < > 136 135  K 4.9   < > 5.6* 4.2  CL 99  --   --  101  CO2 27  --   --  29  GLUCOSE 104*  --   --  122*  BUN 40*  --   --  33*  CREATININE 1.39*  --   --  1.24*  CALCIUM 9.9  --   --  10.3  MG 1.9  --   --   --    < > = values in this interval not displayed.   Liver Function Tests No results for input(s): AST, ALT, ALKPHOS, BILITOT, PROT, ALBUMIN in the last 72 hours. No results for input(s): LIPASE, AMYLASE in the last 72 hours. High Sensitivity Troponin:   Recent Labs  Lab 04/08/20 1144 04/08/20 1507 04/08/20 2102  TROPONINIHS 36* 52* 45*    BNP Invalid input(s): POCBNP D-Dimer No results for input(s): DDIMER in the last 72 hours. Hemoglobin A1C No results for input(s): HGBA1C in the last 72 hours. Fasting Lipid Panel No results for input(s): CHOL, HDL, LDLCALC, TRIG, CHOLHDL, LDLDIRECT in the last 72 hours. Thyroid Function Tests No results for input(s): TSH, T4TOTAL, T3FREE, THYROIDAB in the last 72 hours.  Invalid input(s): FREET3 _____________  CARDIAC CATHETERIZATION  Result Date: 04/10/2020  Mid Cx lesion is 50% stenosed.  LV end diastolic pressure is mildly elevated.  There is mild aortic valve stenosis. Calculated valve area 1.56 cm2.  Ao sat 98%, PA sat 73%, PA pressure 39/5, mean PA pressure 19 mm Hg; mean PCWP 14 mm Hg; CO 6.5 L/min; CI 3.3.   Small radial artery. Ulnar access obtained. Vasospasm noted.  Nonobstructive CAD.  Mild aortic stenosis.  Will hydrate gently post cath given renal insufficiency.  Ultimately, may need diuresis.    US Venous Img Lower Unilateral Left (DVT)  Result Date: 04/09/2020 CLINICAL DATA:  85 year old female with history of left lower extremity swelling. EXAM: LEFT LOWER EXTREMITY VENOUS DOPPLER ULTRASOUND TECHNIQUE: Gray-scale sonography with graded compression, as well as color Doppler and duplex ultrasound were performed to evaluate the left lower extremity deep venous systems from the level of the common femoral vein and including the common femoral, femoral, profunda femoral, popliteal and calf veins including the posterior tibial, peroneal and gastrocnemius veins when visible. Spectral Doppler was utilized to evaluate flow at rest and with distal augmentation maneuvers in the common femoral, femoral and popliteal veins. The contralateral common femoral vein was also evaluated for comparison. COMPARISON:  None. FINDINGS: LEFT LOWER EXTREMITY Common Femoral Vein: No evidence of thrombus. Normal compressibility, respiratory phasicity and response to augmentation. Central Greater Saphenous Vein: No evidence of thrombus. Normal compressibility and flow on color Doppler imaging. Central Profunda Femoral Vein: No evidence of thrombus. Normal compressibility and flow on color Doppler imaging. Femoral Vein: No evidence of thrombus. Normal compressibility, respiratory phasicity and response to augmentation. Popliteal Vein: No evidence of thrombus. Normal compressibility, respiratory phasicity and response to augmentation. Calf Veins: No evidence of thrombus. Normal compressibility and flow on color Doppler imaging. Other Findings:  None. RIGHT LOWER EXTREMITY Common Femoral Vein: No evidence of thrombus. Normal compressibility, respiratory phasicity and response to augmentation. IMPRESSION: No evidence of left lower extremity  deep venous thrombosis. Ruthann Cancer, MD [facility] Radiology Electronically Signed   By: Ruthann Cancer MD   On: 04/09/2020 12:29   DG Chest Port 1 View  Result Date: 04/08/2020 CLINICAL DATA:  Shortness of breath. EXAM: PORTABLE CHEST 1 VIEW COMPARISON:  02/09/2019. FINDINGS: Mediastinum hilar structures normal. Cardiomegaly. Mild pulmonary venous congestion. Mild bilateral interstitial prominence. Mild CHF cannot be excluded. Small scratched it tiny left pleural effusion cannot  be excluded. No pneumothorax. IMPRESSION: Cardiomegaly with mild pulmonary venous congestion and mild bilateral interstitial prominence. Mild CHF cannot be excluded. Tiny left pleural effusion cannot be excluded. Electronically Signed   By: Marcello Moores  Register   On: 04/08/2020 12:49   ECHOCARDIOGRAM LIMITED  Result Date: 04/09/2020    ECHOCARDIOGRAM LIMITED REPORT   Patient Name:   Stephanie [name] [facility] Date of Exam: 04/09/2020 Medical Rec #:  [id number]         Height:       65.0 in Accession #:    [id number]        Weight:       198.2 lb Date of Birth:  [birth year]         BSA:          1.971 m Patient Age:    40 years          BP:           143/70 mmHg Patient Gender: F                 HR:           69 bpm. Exam Location:  [facility] Procedure: Limited Echo Indications:    Aortic stenosis I35.0  History:        Patient has prior history of Echocardiogram examinations, most                 recent 02/22/2020. Signs/Symptoms:Dyspnea; Risk                 Factors:Non-Smoker, Diabetes, Dyslipidemia and Hypertension.                 Sepsis.  Sonographer:    Leavy Cella RDCS (AE) Referring Phys: [id number] [place]  1. Left ventricular ejection fraction, by estimation, is 55 to 60%. The left ventricle has normal function. Left ventricular diastolic parameters are consistent with Grade II diastolic dysfunction (pseudonormalization). Elevated left atrial pressure.  2. The  pericardial effusion is circumferential.  3. Moderate mitral annular calcification.  4. Moderate aortic stenosis by gradient, severe by AVA VTI. Borderline dimensionless index for severe at 0.26. Lower stroke volume index likely contributing to lower than expected gradient. Essentially moderate to severe AS more toward the severe side of the spectrum.. The aortic valve is tricuspid. There is moderate calcification of the aortic valve. There is moderate thickening of the aortic valve. FINDINGS  Left Ventricle: Left ventricular ejection fraction, by estimation, is 55 to 60%. The left ventricle has normal function. Left ventricular diastolic parameters are consistent with Grade II diastolic dysfunction (pseudonormalization). Elevated left atrial  pressure. Pericardium: Trivial pericardial effusion is present. The pericardial effusion is circumferential. Mitral Valve: There is mild thickening of the mitral valve leaflet(s). There is mild calcification of the mitral valve leaflet(s). Moderate mitral annular calcification. The mean mitral valve gradient is 3.3 mmHg with average heart rate of 65 bpm. Aortic Valve: Moderate aortic stenosis by gradient, severe by AVA VTI. Borderline dimensionless index for severe at 0.26. Lower stroke volume index likely contributing to lower than expected gradient. Essentially moderate to severe AS more toward the severe side of the spectrum. The aortic valve is tricuspid. There is moderate calcification of the aortic valve. There is moderate thickening of the aortic valve. There is moderate aortic valve annular calcification. Aortic valve mean gradient measures 25.1 mmHg. Aortic valve peak gradient measures 43.8 mmHg. Aortic valve area, by VTI measures 0.81 cm. Pulmonic Valve: The pulmonic valve was  not well visualized. Pulmonic valve regurgitation is not visualized. No evidence of pulmonic stenosis. LEFT VENTRICLE PLAX 2D LVIDd:         4.73 cm  Diastology LVIDs:         3.48 cm  LV e'  medial:    4.06 cm/s LV PW:         1.22 cm  LV E/e' medial:  36.0 LV IVS:        1.51 cm  LV e' lateral:   2.94 cm/s LVOT diam:     2.00 cm  LV E/e' lateral: 49.7 LV SV:         63 LV SV Index:   32 LVOT Area:     3.14 cm  LEFT ATRIUM           Index LA diam:      4.20 cm 2.13 cm/m LA Vol (A4C): 62.5 ml 31.72 ml/m  AORTIC VALVE AV Area (Vmax):    0.74 cm AV Area (Vmean):   0.61 cm AV Area (VTI):     0.81 cm AV Vmax:           330.94 cm/s AV Vmean:          234.927 cm/s AV VTI:            0.787 m AV Peak Grad:      43.8 mmHg AV Mean Grad:      25.1 mmHg LVOT Vmax:         78.04 cm/s LVOT Vmean:        45.914 cm/s LVOT VTI:          0.202 m LVOT/AV VTI ratio: 0.26  AORTA Ao Root diam: 2.90 cm MITRAL VALVE MV Area (PHT): 2.07 cm     SHUNTS MV Mean grad:  3.3 mmHg     Systemic VTI:  0.20 m MV Decel Time: 367 msec     Systemic Diam: 2.00 cm MV E velocity: 146.00 cm/s MV A velocity: 86.90 cm/s MV E/A ratio:  1.68 Carlyle Dolly MD Electronically signed by Carlyle Dolly MD Signature Date/Time: 04/09/2020/12:58:04 PM    Final    Disposition   Pt is being discharged home today in good condition.  Follow-up Plans & Appointments     Follow-up Information    [facility] Follow up on 04/17/2020.   Specialty: Cardiology Why: Please present to have labs drawn. Does not have to be fasting. Contact information: [address] [place]       Erma Heritage, PA-C Follow up on 04/24/2020.   Specialties: Physician Assistant, Cardiology Why: Please arrive 15 minutes early for your 3:30pm post-hospital cardiology appointment. Contact information: [place] [address] [phone]                Discharge Medications   Allergies as of 04/11/2020      Reactions   Amoxicillin Swelling   Tongue swelling   Penicillins Swelling   Has patient had a PCN reaction causing immediate rash, facial/tongue/throat swelling, SOB or  lightheadedness with hypotension: No Has patient had a PCN reaction causing severe rash involving mucus membranes or skin necrosis: No Has patient had a PCN reaction that required hospitalization: No Has patient had a PCN reaction occurring within the last 10 years: No If all of the above answers are "NO", then may proceed with Cephalosporin use.   Sulfa Antibiotics Swelling   Lip swelling  Medication List    STOP taking these medications   lisinopril 20 MG tablet Commonly known as: ZESTRIL   triamterene-hydrochlorothiazide 37.5-25 MG capsule Commonly known as: DYAZIDE     TAKE these medications   albuterol (2.5 MG/3ML) 0.083% nebulizer solution Commonly known as: PROVENTIL Take 2.5 mg by nebulization every 6 (six) hours as needed for wheezing or shortness of breath.   amLODipine 5 MG tablet Commonly known as: NORVASC Take 5 mg by mouth daily.   atorvastatin 20 MG tablet Commonly known as: LIPITOR Take 20 mg by mouth every evening.   furosemide 20 MG tablet Commonly known as: Lasix Take 1 tablet (20 mg total) by mouth daily.   gabapentin 100 MG capsule Commonly known as: NEURONTIN Take 1-2 capsules by mouth at bedtime.   hydrALAZINE 25 MG tablet Commonly known as: APRESOLINE Take 1 tablet (25 mg total) by mouth every 8 (eight) hours.   Levemir FlexTouch 100 UNIT/ML FlexPen Generic drug: insulin detemir Inject 30 Units into the skin at bedtime. What changed:   how much to take  when to take this   metFORMIN 500 MG tablet Commonly known as: GLUCOPHAGE Take 1 tablet (500 mg total) by mouth 2 (two) times daily. Resume on 04/13/20. What changed:   additional instructions  Another medication with the same name was removed. Continue taking this medication, and follow the directions you see here.   predniSONE 20 MG tablet Commonly known as: DELTASONE Take 2 tablets (40 mg total) by mouth daily with breakfast. Start taking on: April 12, 2020   Vitamin B 12  500 MCG Tabs Take 1 tablet by mouth daily.          Outstanding Labs/Studies   BMP in 1 week  Duration of Discharge Encounter   Greater than 30 minutes including physician time.  Signed, Tami Lin Duke, PA 04/11/2020, 12:49 PM   Personally seen and examined. Agree with above.   Feels well.  Had trouble following breath hold for cardiac CT for TAVR evaluation - Dr. Audie Box was there to help. Katie aware. Will try again at a later date.  We will let her go on Lasix 20 mg once a day.  Appreciate structural heart team evaluation.  3/6 crescendo decrescendo murmur heard on exam.  Cardiac catheterization and echocardiogram data reviewed.  Discharge   Candee Furbish, MD

## 2020-04-11 NOTE — Progress Notes (Addendum)
[place] VALVE TEAM  Patient Name: Stephanie [name] Date of Encounter: 04/11/2020  Primary Cardiologist: Dr. Jesse Sans Problem List     Principal Problem:   Dyspnea Active Problems:   Essential hypertension, benign   Asthma   DM (diabetes mellitus) ([place])   Aortic stenosis     Subjective   Shortness of breath resolved. No chest pain. Wants to go home.   Inpatient Medications    Scheduled Meds: . amLODipine  5 mg Oral Daily  . atorvastatin  20 mg Oral QPM  . enoxaparin (LOVENOX) injection  40 mg Subcutaneous Q24H  . gabapentin  100-200 mg Oral QHS  . hydrALAZINE  25 mg Oral Q8H  . insulin aspart  0-15 Units Subcutaneous TID WC  . insulin aspart  0-5 Units Subcutaneous QHS  . insulin detemir  32 Units Subcutaneous Daily  . pneumococcal 23 valent vaccine  0.5 mL Intramuscular Tomorrow-1000  . predniSONE  50 mg Oral Q breakfast  . sodium chloride flush  3 mL Intravenous Q12H  . sodium chloride flush  3 mL Intravenous Q12H   Continuous Infusions: . sodium chloride     PRN Meds: sodium chloride, acetaminophen **OR** acetaminophen, acetaminophen, albuterol, ondansetron (ZOFRAN) IV, ondansetron **OR** [DISCONTINUED] ondansetron (ZOFRAN) IV, polyethylene glycol, sodium chloride flush   Vital Signs    Vitals:   04/10/20 2330 04/11/20 0402 04/11/20 0629 04/11/20 0852  BP: (!) 116/91 (!) 164/72  133/84  Pulse: 71 63  69  Resp: 19 20 (!) 22 20  Temp: 98 F (36.7 C) 98.2 F (36.8 C)  97.7 F (36.5 C)  TempSrc: Oral Oral  Oral  SpO2: 94% 96%  95%  Weight:   87.7 kg   Height:        Intake/Output Summary (Last 24 hours) at 04/11/2020 0935 Last data filed at 04/11/2020 0853 Gross per 24 hour  Intake 120 ml  Output -  Net 120 ml   Filed Weights   04/08/20 1116 04/09/20 0700 04/11/20 0629  Weight: 91 kg 89.9 kg 87.7 kg    Physical Exam    GEN: Well nourished, well developed, in no acute distress.  HEENT:  Grossly normal.  Neck: Supple, no JVD, carotid bruits, or masses. Cardiac: RRR, 3/6 SEM. No rubs, or gallops. No clubbing, cyanosis, edema.   Respiratory:  Respirations regular and unlabored, clear to auscultation bilaterally. GI: Soft, nontender, nondistended, BS + x 4. MS: no deformity or atrophy. Skin: warm and dry, no rash. Neuro:  Strength and sensation are intact. Psych: AAOx3.  Normal affect.  Labs    CBC Recent Labs    04/08/20 1144 04/10/20 0550 04/10/20 1451 04/10/20 1458  WBC 11.1* 12.2*  --   --   NEUTROABS 8.9*  --   --   --   HGB 12.4 11.8* 12.6 12.9  HCT 39.4 38.1 37.0 38.0  MCV 87.0 88.8  --   --   PLT 208 216  --   --    Basic Metabolic Panel Recent Labs    04/09/20 0914 04/10/20 0550 04/10/20 1451 04/10/20 1458  NA 136 138 136 136  K 5.3* 4.9 5.3* 5.6*  CL 101 99  --   --   CO2 26 27  --   --   GLUCOSE 252* 104*  --   --   BUN 30* 40*  --   --   CREATININE 1.44* 1.39*  --   --   CALCIUM  9.8 9.9  --   --   MG  --  1.9  --   --    Liver Function Tests No results for input(s): AST, ALT, ALKPHOS, BILITOT, PROT, ALBUMIN in the last 72 hours. No results for input(s): LIPASE, AMYLASE in the last 72 hours. Cardiac Enzymes No results for input(s): CKTOTAL, CKMB, CKMBINDEX, TROPONINI in the last 72 hours. BNP Invalid input(s): POCBNP D-Dimer No results for input(s): DDIMER in the last 72 hours. Hemoglobin A1C No results for input(s): HGBA1C in the last 72 hours. Fasting Lipid Panel No results for input(s): CHOL, HDL, LDLCALC, TRIG, CHOLHDL, LDLDIRECT in the last 72 hours. Thyroid Function Tests No results for input(s): TSH, T4TOTAL, T3FREE, THYROIDAB in the last 72 hours.  Invalid input(s): FREET3  Telemetry    Sinus with PVCs - Personally Reviewed  ECG    Sinus tachy  LAFB - Personally Reviewed  Radiology    CARDIAC CATHETERIZATION  Result Date: 04/10/2020  Mid Cx lesion is 50% stenosed.  LV end diastolic pressure is mildly elevated.   There is mild aortic valve stenosis. Calculated valve area 1.56 cm2.  Ao sat 98%, PA sat 73%, PA pressure 39/5, mean PA pressure 19 mm Hg; mean PCWP 14 mm Hg; CO 6.5 L/min; CI 3.3.  Small radial artery. Ulnar access obtained. Vasospasm noted.  Nonobstructive CAD.  Mild aortic stenosis.  Will hydrate gently post cath given renal insufficiency.  Ultimately, may need diuresis.    US Venous Img Lower Unilateral Left (DVT)  Result Date: 04/09/2020 CLINICAL DATA:  85 year old female with history of left lower extremity swelling. EXAM: LEFT LOWER EXTREMITY VENOUS DOPPLER ULTRASOUND TECHNIQUE: Gray-scale sonography with graded compression, as well as color Doppler and duplex ultrasound were performed to evaluate the left lower extremity deep venous systems from the level of the common femoral vein and including the common femoral, femoral, profunda femoral, popliteal and calf veins including the posterior tibial, peroneal and gastrocnemius veins when visible. Spectral Doppler was utilized to evaluate flow at rest and with distal augmentation maneuvers in the common femoral, femoral and popliteal veins. The contralateral common femoral vein was also evaluated for comparison. COMPARISON:  None. FINDINGS: LEFT LOWER EXTREMITY Common Femoral Vein: No evidence of thrombus. Normal compressibility, respiratory phasicity and response to augmentation. Central Greater Saphenous Vein: No evidence of thrombus. Normal compressibility and flow on color Doppler imaging. Central Profunda Femoral Vein: No evidence of thrombus. Normal compressibility and flow on color Doppler imaging. Femoral Vein: No evidence of thrombus. Normal compressibility, respiratory phasicity and response to augmentation. Popliteal Vein: No evidence of thrombus. Normal compressibility, respiratory phasicity and response to augmentation. Calf Veins: No evidence of thrombus. Normal compressibility and flow on color Doppler imaging. Other Findings:  None. RIGHT  LOWER EXTREMITY Common Femoral Vein: No evidence of thrombus. Normal compressibility, respiratory phasicity and response to augmentation. IMPRESSION: No evidence of left lower extremity deep venous thrombosis. Ruthann Cancer, MD [facility] Radiology Electronically Signed   By: Ruthann Cancer MD   On: 04/09/2020 12:29   ECHOCARDIOGRAM LIMITED  Result Date: 04/09/2020    ECHOCARDIOGRAM LIMITED REPORT   Patient Name:   Stephanie [name] [facility] Date of Exam: 04/09/2020 Medical Rec #:  [id number]         Height:       65.0 in Accession #:    [id number]        Weight:       198.2 lb Date of Birth:  1935-11-02  BSA:          1.971 m Patient Age:    70 years          BP:           143/70 mmHg Patient Gender: F                 HR:           69 bpm. Exam Location:  [facility] Procedure: Limited Echo Indications:    Aortic stenosis I35.0  History:        Patient has prior history of Echocardiogram examinations, most                 recent 02/22/2020. Signs/Symptoms:Dyspnea; Risk                 Factors:Non-Smoker, Diabetes, Dyslipidemia and Hypertension.                 Sepsis.  Sonographer:    Leavy Cella RDCS (AE) Referring Phys: [id number] [place]  1. Left ventricular ejection fraction, by estimation, is 55 to 60%. The left ventricle has normal function. Left ventricular diastolic parameters are consistent with Grade II diastolic dysfunction (pseudonormalization). Elevated left atrial pressure.  2. The pericardial effusion is circumferential.  3. Moderate mitral annular calcification.  4. Moderate aortic stenosis by gradient, severe by AVA VTI. Borderline dimensionless index for severe at 0.26. Lower stroke volume index likely contributing to lower than expected gradient. Essentially moderate to severe AS more toward the severe side of the spectrum.. The aortic valve is tricuspid. There is moderate calcification of the aortic valve. There is moderate  thickening of the aortic valve. FINDINGS  Left Ventricle: Left ventricular ejection fraction, by estimation, is 55 to 60%. The left ventricle has normal function. Left ventricular diastolic parameters are consistent with Grade II diastolic dysfunction (pseudonormalization). Elevated left atrial  pressure. Pericardium: Trivial pericardial effusion is present. The pericardial effusion is circumferential. Mitral Valve: There is mild thickening of the mitral valve leaflet(s). There is mild calcification of the mitral valve leaflet(s). Moderate mitral annular calcification. The mean mitral valve gradient is 3.3 mmHg with average heart rate of 65 bpm. Aortic Valve: Moderate aortic stenosis by gradient, severe by AVA VTI. Borderline dimensionless index for severe at 0.26. Lower stroke volume index likely contributing to lower than expected gradient. Essentially moderate to severe AS more toward the severe side of the spectrum. The aortic valve is tricuspid. There is moderate calcification of the aortic valve. There is moderate thickening of the aortic valve. There is moderate aortic valve annular calcification. Aortic valve mean gradient measures 25.1 mmHg. Aortic valve peak gradient measures 43.8 mmHg. Aortic valve area, by VTI measures 0.81 cm. Pulmonic Valve: The pulmonic valve was not well visualized. Pulmonic valve regurgitation is not visualized. No evidence of pulmonic stenosis. LEFT VENTRICLE PLAX 2D LVIDd:         4.73 cm  Diastology LVIDs:         3.48 cm  LV e' medial:    4.06 cm/s LV PW:         1.22 cm  LV E/e' medial:  36.0 LV IVS:        1.51 cm  LV e' lateral:   2.94 cm/s LVOT diam:     2.00 cm  LV E/e' lateral: 49.7 LV SV:         63 LV SV Index:   32 LVOT Area:     3.14  cm  LEFT ATRIUM           Index LA diam:      4.20 cm 2.13 cm/m LA Vol (A4C): 62.5 ml 31.72 ml/m  AORTIC VALVE AV Area (Vmax):    0.74 cm AV Area (Vmean):   0.61 cm AV Area (VTI):     0.81 cm AV Vmax:           330.94 cm/s AV Vmean:           234.927 cm/s AV VTI:            0.787 m AV Peak Grad:      43.8 mmHg AV Mean Grad:      25.1 mmHg LVOT Vmax:         78.04 cm/s LVOT Vmean:        45.914 cm/s LVOT VTI:          0.202 m LVOT/AV VTI ratio: 0.26  AORTA Ao Root diam: 2.90 cm MITRAL VALVE MV Area (PHT): 2.07 cm     SHUNTS MV Mean grad:  3.3 mmHg     Systemic VTI:  0.20 m MV Decel Time: 367 msec     Systemic Diam: 2.00 cm MV E velocity: 146.00 cm/s MV A velocity: 86.90 cm/s MV E/A ratio:  1.68 Carlyle Dolly MD Electronically signed by Carlyle Dolly MD Signature Date/Time: 04/09/2020/12:58:04 PM    Final     Cardiac Studies   Echo 04/09/2020 IMPRESSIONS  1. Left ventricular ejection fraction, by estimation, is 55 to 60%. The  left ventricle has normal function. Left ventricular diastolic parameters  are consistent with Grade II diastolic dysfunction (pseudonormalization).  Elevated left atrial pressure.  2. The pericardial effusion is circumferential.  3. Moderate mitral annular calcification.  4. Moderate aortic stenosis by gradient, severe by AVA VTI. Borderline  dimensionless index for severe at 0.26. Lower stroke volume index likely  contributing to lower than expected gradient. Essentially moderate to  severe AS more toward the severe side  of the spectrum.. The aortic valve is tricuspid. There is moderate  calcification of the aortic valve. There is moderate thickening of the  aortic valve.   FINDINGS  Left Ventricle: Left ventricular ejection fraction, by estimation, is 55  to 60%. The left ventricle has normal function. Left ventricular diastolic  parameters are consistent with Grade II diastolic dysfunction  (pseudonormalization). Elevated left atrial  pressure.   Pericardium: Trivial pericardial effusion is present. The pericardial  effusion is circumferential.   Mitral Valve: There is mild thickening of the mitral valve leaflet(s).  There is mild calcification of the mitral valve leaflet(s).  Moderate  mitral annular calcification. The mean mitral valve gradient is 3.3 mmHg  with average heart rate of 65 bpm.   Aortic Valve: Moderate aortic stenosis by gradient, severe by AVA VTI.  Borderline dimensionless index for severe at 0.26. Lower stroke volume  index likely contributing to lower than expected gradient. Essentially  moderate to severe AS more toward the  severe side of the spectrum. The aortic valve is tricuspid. There is  moderate calcification of the aortic valve. There is moderate thickening  of the aortic valve. There is moderate aortic valve annular calcification.  Aortic valve mean gradient measures  25.1 mmHg. Aortic valve peak gradient measures 43.8 mmHg. Aortic valve  area, by VTI measures 0.81 cm.   Pulmonic Valve: The pulmonic valve was not well visualized. Pulmonic valve  regurgitation is not visualized. No evidence of pulmonic stenosis.  LEFT VENTRICLE  PLAX 2D  LVIDd:     4.73 cm Diastology  LVIDs:     3.48 cm LV e' medial:  4.06 cm/s  LV PW:     1.22 cm LV E/e' medial: 36.0  LV IVS:    1.51 cm LV e' lateral:  2.94 cm/s  LVOT diam:   2.00 cm LV E/e' lateral: 49.7  LV SV:     63  LV SV Index:  32  LVOT Area:   3.14 cm     LEFT ATRIUM      Index  LA diam:   4.20 cm 2.13 cm/m  LA Vol (A4C): 62.5 ml 31.72 ml/m  AORTIC VALVE  AV Area (Vmax):  0.74 cm  AV Area (Vmean):  0.61 cm  AV Area (VTI):   0.81 cm  AV Vmax:      330.94 cm/s  AV Vmean:     234.927 cm/s  AV VTI:      0.787 m  AV Peak Grad:   43.8 mmHg  AV Mean Grad:   25.1 mmHg  LVOT Vmax:     78.04 cm/s  LVOT Vmean:    45.914 cm/s  LVOT VTI:     0.202 m  LVOT/AV VTI ratio: 0.26    AORTA  Ao Root diam: 2.90 cm   MITRAL VALVE  MV Area (PHT): 2.07 cm   SHUNTS  MV Mean grad: 3.3 mmHg   Systemic VTI: 0.20 m  MV Decel Time: 367 msec   Systemic Diam: 2.00 cm  MV E velocity: 146.00 cm/s   MV A velocity: 86.90 cm/s  MV E/A ratio: 1.68   _________________________  [facility] 04/10/2020 Procedures RIGHT/LEFT HEART CATH AND CORONARY ANGIOGRAPHY  Conclusion    Mid Cx lesion is 50% stenosed.  LV end diastolic pressure is mildly elevated.  There is mild aortic valve stenosis. Calculated valve area 1.56 cm2.  Ao sat 98%, PA sat 73%, PA pressure 39/5, mean PA pressure 19 mm Hg; mean PCWP 14 mm Hg; CO 6.5 L/min; CI 3.3.  Small radial artery. Ulnar access obtained. Vasospasm noted.   Nonobstructive CAD.  Mild aortic stenosis.    Will hydrate gently post cath given renal insufficiency.  Ultimately, may need diuresis.      Patient Profile     Stephanie [name] is a 85 y.o. female with a history of anemia, arthritis, asthma, chronic kidney disease, HTN, HLD, DMT2 and aortic stenosis currently admitted for flash pulmonary edema who was transferred to [facility] from [facility] for [facility] and evaluation by structural heart team.   Assessment & Plan    Moderate to severe AS: originally evaluated in the structural clinic by Dr. Angelena Form on 02/22/20 and felt to have moderate AS and to be relatively asymptomatic. Continued surveillience was recommended. She then presented to [facility] ED on 04/08/2020 for evaluation of worsening dyspnea and wheezing and found to have flash pulmonary edema. Repeat echo showed EF 55%, moderate to severe AS with a mean gradient of 25.1 mm hg, AVA 0.74cm2, DVI 0.26, SVI 32. She was transferred to [facility] for [facility] which showed non obst CAD and mild AS with a AVA od 1.56cm2. Will recheck renal function and proceed with cardiac CT and CT angiography if creat stable. This will help qualify severity of valve. Okay for DC home after scans  Acute on chronic diastolic CHF: CXR showed pulmonary vascular congestion and BNP elevated at 649. She was given IV lasix and weight down 200--> 193 lbs.  Net neg 1.1L. Gently hydrated after cath and currently not on any diuretics. Would discharge  home on lasix.   CKD stage III: creat baseline appears to be 1.0-1.5. Cathed yesterday and gently hydrated with fluids. Creat 1.39 yesterday. No repeat BMET. Will recheck labs now.  DMT2: continue SSI.   Signed, Angelena Form, PA-C  04/11/2020, 9:35 AM  Pager [phone]  I have personally seen and examined this patient. I agree with the assessment and plan as outlined above.  She is feeling much better. Her AS is likely severe. Given recent presentation with flash pulmonary edema, we will need to begin TAVR workup. Cardiac cath reviewed. Non-obstructive CAD. Pressures ok.  Will plan pre TAVR CT scans today. OK to discharge home later. We will review her scans and then get her in to see our CT surgeon as an outpatient.   Lauree Chandler 04/11/2020 10:56 AM

## 2020-04-14 ENCOUNTER — Encounter: Payer: Self-pay | Admitting: Physician Assistant

## 2020-04-14 ENCOUNTER — Other Ambulatory Visit: Payer: Self-pay | Admitting: Physician Assistant

## 2020-04-14 DIAGNOSIS — I350 Nonrheumatic aortic (valve) stenosis: Secondary | ICD-10-CM

## 2020-04-22 ENCOUNTER — Other Ambulatory Visit (HOSPITAL_COMMUNITY): Payer: Self-pay | Admitting: Emergency Medicine

## 2020-04-22 ENCOUNTER — Telehealth (HOSPITAL_COMMUNITY): Payer: Self-pay | Admitting: Emergency Medicine

## 2020-04-22 MED ORDER — METOPROLOL TARTRATE 50 MG PO TABS: 50.0000 mg | ORAL_TABLET | Freq: Once | ORAL | 0 refills | Status: DC

## 2020-04-22 NOTE — Progress Notes (Signed)
One time dose 50mg  metoprolol tartrate prescribed to her pharmacy on file. Will call patient to review.  Marchia Bond RN Navigator Cardiac Imaging [facility] Heart [facility] V[facility] Services [phone] Office  [phone] Cell

## 2020-04-22 NOTE — Telephone Encounter (Signed)
Attempted to call patient regarding upcoming cardiac CT appointment. Left message on voicemail with name and callback number Marchia Bond RN Navigator Cardiac Imaging [facility] [phone] Office [phone] Cell  Details left about making appt for IV hydration in the infusion clinic at [place] and also sent in a one time dose 50mg  metoprolol to her pharmacy in [place] for HR control. I wish to review instructions in detail when she returns my call. Clarise Cruz

## 2020-04-23 ENCOUNTER — Telehealth (HOSPITAL_COMMUNITY): Payer: Self-pay | Admitting: Emergency Medicine

## 2020-04-23 NOTE — Progress Notes (Signed)
Your cardiac CT is scheduled at one of the below locations on 04/25/20 :   [facility] [place], [place] [address] [phone]  If scheduled at [facility], please arrive at the [facility] main entrance (entrance A) of [facility] 30 minutes prior to test start time.  Check into admitting for you IV infusion appt in the infusion clinic at 10am. This appt was made to pre-hydrate you in order to protect your kidneys from the contrast/IV dye used in the CT scan procedure.   Please follow these instructions carefully (unless otherwise directed):  On the Night Before the Test: . Be sure to Drink plenty of water. . Do not consume any caffeinated/decaffeinated beverages or chocolate 12 hours prior to your test. . Do not take any antihistamines 12 hours prior to your test.  On the Day of the Test: . Drink plenty of water until 1 hour prior to the test. . Do not eat any food 4 hours prior to the test. . You may take your regular medications prior to the test.  . Take metoprolol (Lopressor) 50mg  two hours prior to test. (this was sent to your pharmacy in [place]- used to slow the heart rate down for our test) . FEMALES- please wear underwire-free bra if available       After the Test: . Drink plenty of water. . After receiving IV contrast, you may experience a mild flushed feeling. This is normal. . On occasion, you may experience a mild rash up to 24 hours after the test. This is not dangerous. If this occurs, you can take Benadryl 25 mg and increase your fluid intake. . If you experience trouble breathing, this can be serious. If it is severe call 911 IMMEDIATELY. If it is mild, please call our office. . If you take any of these medications: Glipizide/Metformin, Avandament, Glucavance, please do not take 48 hours after completing test unless otherwise instructed.  For non-scheduling related questions, please contact the cardiac imaging nurse  navigator should you have any questions/concerns: Marchia Bond, Cardiac Imaging Nurse Navigator Gordy Clement, Cardiac Imaging Nurse Navigator [place] [facility] Direct Office Dial: [phone]   For scheduling needs, including cancellations and rescheduling, please call [place], [phone].

## 2020-04-23 NOTE — Telephone Encounter (Signed)
Thanks sara, I tried as well and couldn't get an answer either.

## 2020-04-23 NOTE — Telephone Encounter (Signed)
Attempted to call patient regarding upcoming cardiac CT appointment. °Left message on voicemail with name and callback number °Kadon Andrus RN Navigator Cardiac Imaging °[place] [facility] °[phone] Office °[phone] Cell ° °

## 2020-04-24 ENCOUNTER — Ambulatory Visit (INDEPENDENT_AMBULATORY_CARE_PROVIDER_SITE_OTHER): Payer: Medicare HMO | Admitting: Student

## 2020-04-24 ENCOUNTER — Other Ambulatory Visit: Payer: Self-pay

## 2020-04-24 ENCOUNTER — Encounter: Payer: Self-pay | Admitting: Student

## 2020-04-24 VITALS — BP 142/72 | HR 76 | Wt 200.0 lb

## 2020-04-24 DIAGNOSIS — I350 Nonrheumatic aortic (valve) stenosis: Secondary | ICD-10-CM

## 2020-04-24 DIAGNOSIS — I10 Essential (primary) hypertension: Secondary | ICD-10-CM

## 2020-04-24 DIAGNOSIS — I5032 Chronic diastolic (congestive) heart failure: Secondary | ICD-10-CM | POA: Diagnosis not present

## 2020-04-24 DIAGNOSIS — I2510 Atherosclerotic heart disease of native coronary artery without angina pectoris: Secondary | ICD-10-CM

## 2020-04-24 DIAGNOSIS — N1832 Chronic kidney disease, stage 3b: Secondary | ICD-10-CM

## 2020-04-24 MED ORDER — AMLODIPINE BESYLATE 5 MG PO TABS: 5.0000 mg | ORAL_TABLET | Freq: Every day | ORAL | 3 refills | Status: DC

## 2020-04-24 MED ORDER — HYDRALAZINE HCL 25 MG PO TABS: 25.0000 mg | ORAL_TABLET | Freq: Three times a day (TID) | ORAL | 3 refills | Status: DC

## 2020-04-24 MED ORDER — FUROSEMIDE 20 MG PO TABS: 20.0000 mg | ORAL_TABLET | Freq: Every day | ORAL | 3 refills | Status: DC

## 2020-04-24 NOTE — Progress Notes (Signed)
Cardiology Office Note    Date:  04/24/2020   ID:  Stephanie [name], DOB 1935/07/30, MRN [id number]  PCP:  Abran Richard, MD  Cardiologist: Rozann Lesches, MD    Chief Complaint  Patient presents with  . Hospitalization Follow-up    History of Present Illness:    Stephanie [name] is a 85 y.o. female with past medical history of HTN, HLD, IDDM, COPD,anemia and aortic stenosis who presents to the office today for hospital follow-up.  She recently presented to [facility] ED on 04/08/2020 for evaluation of worsening dyspnea on exertion, orthopnea and PND which had started the evening prior to arrival. BNP was elevated to 649 and CXR was consistent with CHF. Her episode was overall felt to be most consistent with flash pulmonary edema and she was started on IV Lasix. Repeat limited echocardiogram showed a preserved EF of 55 to 60% and AS which was felt to be in a moderate to severe range. She was transferred to [facility] for [facility] and this showed nonobstructive CAD with 50% LCx stenosis and her aortic stenosis was felt to be mild with calculated valve area 1.56 cm2. She was going to undergo CT imaging for further evaluation of TAVR during admission but given her rising creatinine (peaked at 1.44 and improved to 1.24 on 3/25) and inability to hold her breath, it was recommended to have these performed as an outpatient.  She was discharged home on Lasix 20 mg daily along with remaining on Amlodipine 5 mg daily, Atorvastatin 20 mg daily and Hydralazine 25 mg 3 times daily.   In talking with the patient and her daughter today, her daughter does mention the rooms in their home were being painted the day before the patient came into the hospital and questions if this contributed to her symptoms. The patient reports her breathing has overall been stable since returning home. She denies any specific orthopnea, PND or pitting edema. No recent chest pain or palpitations.  In reviewing her medications,  she has not been taking Lasix 20mg  daily as she says she was unsure what this was for. Does not have scales at home to track her weight but this has increased by 7 lbs since hospital discharge (193 lbs --> 200 lbs today). She has also only been taking Hydralazine once daily instead of as TID dosing.    Past Medical History:  Diagnosis Date  . Aortic stenosis   . Arthritis   . Asthma   . CKD (chronic kidney disease)   . Essential hypertension   . Mixed hyperlipidemia   . Symptomatic anemia    Presumed GI bleed January 2021 - she declined GI work-up  . Type 2 diabetes mellitus ([place])     Past Surgical History:  Procedure Laterality Date  . APPENDECTOMY    . BACK SURGERY    . CYST REMOVAL HAND     Right, hospital in [place] ARTHROSCOPY WITH MEDIAL MENISECTOMY Right 02/28/2012   Procedure: KNEE ARTHROSCOPY WITH MEDIAL MENISECTOMY;  Surgeon: Carole Civil, MD;  Location: [facility];  Service: Orthopedics;  Laterality: Right;  . REPAIR KNEE LIGAMENT    . RIGHT/LEFT HEART CATH AND CORONARY ANGIOGRAPHY N/A 04/10/2020   Procedure: RIGHT/LEFT HEART CATH AND CORONARY ANGIOGRAPHY;  Surgeon: Jettie Booze, MD;  Location: [facility];  Service: Cardiovascular;  Laterality: N/A;    Current Medications: Outpatient Medications Prior to Visit  Medication Sig Dispense Refill  . albuterol (PROVENTIL) (2.5 MG/3ML) 0.083%  nebulizer solution Take 2.5 mg by nebulization every 6 (six) hours as needed for wheezing or shortness of breath.   2  . atorvastatin (LIPITOR) 20 MG tablet Take 20 mg by mouth every evening.     . gabapentin (NEURONTIN) 100 MG capsule Take 1-2 capsules by mouth at bedtime.    . Insulin Detemir (LEVEMIR FLEXTOUCH) 100 UNIT/ML Pen Inject 30 Units into the skin at bedtime. (Patient taking differently: Inject 32 Units into the skin daily.) 15 mL 11  . metFORMIN (GLUCOPHAGE) 500 MG tablet Take 1 tablet (500 mg total) by mouth 2 (two) times daily. Resume on 04/13/20.    .  metoprolol tartrate (LOPRESSOR) 50 MG tablet Take 1 tablet (50 mg total) by mouth once for 1 dose. Please take 2 hr prior to CT scan 1 tablet 0  . amLODipine (NORVASC) 5 MG tablet Take 5 mg by mouth daily.    . hydrALAZINE (APRESOLINE) 25 MG tablet Take 1 tablet (25 mg total) by mouth every 8 (eight) hours. 90 tablet 6  . B-D UF III MINI PEN NEEDLES 31G X 5 MM MISC SMARTSIG:1 Each SUB-Q Daily    . ULTICARE MINI PEN NEEDLES 31G X 6 MM MISC     . Cyanocobalamin (VITAMIN B 12) 500 MCG TABS Take 1 tablet by mouth daily.    . furosemide (LASIX) 20 MG tablet Take 1 tablet (20 mg total) by mouth daily. 90 tablet 3  . furosemide (LASIX) 20 MG tablet TAKE 1 TABLET (20 MG TOTAL) BY MOUTH DAILY. 90 tablet 3  . hydrALAZINE (APRESOLINE) 25 MG tablet TAKE 1 TABLET (25 MG TOTAL) BY MOUTH EVERY EIGHT HOURS. 90 tablet 6  . predniSONE (DELTASONE) 20 MG tablet Take 2 tablets (40 mg total) by mouth daily with breakfast. 1 tablet 0  . predniSONE (DELTASONE) 20 MG tablet TAKE 2 TABLETS (40 MG TOTAL) BY MOUTH DAILY WITH BREAKFAST FOR ONE DOSE. 2 tablet 0   No facility-administered medications prior to visit.     Allergies:   Amoxicillin, Macrobid [nitrofurantoin], Penicillins, and Sulfa antibiotics   Social History   Socioeconomic History  . Marital status: Divorced    Spouse name: Not on file  . Number of children: 3  . Years of education: Not on file  . Highest education level: Not on file  Occupational History  . Occupation: Retired-cook   Tobacco Use  . Smoking status: Never Smoker  . Smokeless tobacco: Never Used  Vaping Use  . Vaping Use: Never used  Substance and Sexual Activity  . Alcohol use: No  . Drug use: No  . Sexual activity: Never  Other Topics Concern  . Not on file  Social History Narrative   ** Merged History Encounter **       Social Determinants of Health   Financial Resource Strain: Not on file  Food Insecurity: Not on file  Transportation Needs: Not on file  Physical  Activity: Not on file  Stress: Not on file  Social Connections: Not on file     Family History:  The patient's family history includes Arthritis in an other family member; Asthma in an other family member; Cancer in an other family member; Diabetes in her brother, father, mother, and another family member; Diverticulitis in her mother; Hypertension in her mother and sister; Lung disease in an other family member.   Review of Systems:   Please see the history of present illness.     General:  No chills, fever, night sweats or  weight changes.  Cardiovascular:  No chest pain, dyspnea on exertion, orthopnea, palpitations, paroxysmal nocturnal dyspnea. Positive for edema.  Dermatological: No rash, lesions/masses Respiratory: No cough, dyspnea Urologic: No hematuria, dysuria Abdominal:   No nausea, vomiting, diarrhea, bright red blood per rectum, melena, or hematemesis Neurologic:  No visual changes, wkns, changes in mental status. All other systems reviewed and are otherwise negative except as noted above.   Physical Exam:    VS:  BP (!) 142/72   Pulse 76   Wt 200 lb (90.7 kg)   SpO2 95%   BMI 33.28 kg/m    General: Well developed, well nourished,female appearing in no acute distress. Head: Normocephalic, atraumatic. Neck: No carotid bruits. JVD not elevated.  Lungs: Respirations regular and unlabored, without wheezes or rales.  Heart: Regular rate and rhythm with occasional ectopic beats. No S3 or S4.  3/6 SEM along RUSB.  Abdomen: Appears non-distended. No obvious abdominal masses. Msk:  Strength and tone appear normal for age. No obvious joint deformities or effusions. Extremities: No clubbing or cyanosis. 1+ pitting edema along LLE, trace edema along RLE.  Distal pedal pulses are 2+ bilaterally. Neuro: Alert and oriented X 3. Moves all extremities spontaneously. No focal deficits noted. Psych:  Responds to questions appropriately with a normal affect. Skin: No rashes or lesions  noted  Wt Readings from Last 3 Encounters:  04/24/20 200 lb (90.7 kg)  04/11/20 193 lb 6.4 oz (87.7 kg)  02/22/20 201 lb 9.6 oz (91.4 kg)     Studies/Labs Reviewed:   EKG:  EKG is not ordered today.    Recent Labs: 04/08/2020: B Natriuretic Peptide 649.0 04/10/2020: Hemoglobin 12.9; Magnesium 1.9; Platelets 216 04/11/2020: BUN 33; Creatinine, Ser 1.24; Potassium 4.2; Sodium 135   Lipid Panel    Component Value Date/Time   CHOL 184 04/11/2020 0945   TRIG 283 (H) 04/11/2020 0945   HDL 46 04/11/2020 0945   CHOLHDL 4.0 04/11/2020 0945   VLDL 57 (H) 04/11/2020 0945   LDLCALC 81 04/11/2020 0945    Additional studies/ records that were reviewed today include:   Limited Echo: 03/2020 IMPRESSIONS    1. Left ventricular ejection fraction, by estimation, is 55 to 60%. The  left ventricle has normal function. Left ventricular diastolic parameters  are consistent with Grade II diastolic dysfunction (pseudonormalization).  Elevated left atrial pressure.  2. The pericardial effusion is circumferential.  3. Moderate mitral annular calcification.  4. Moderate aortic stenosis by gradient, severe by AVA VTI. Borderline  dimensionless index for severe at 0.26. Lower stroke volume index likely  contributing to lower than expected gradient. Essentially moderate to  severe AS more toward the severe side  of the spectrum.. The aortic valve is tricuspid. There is moderate  calcification of the aortic valve. There is moderate thickening of the  aortic valve.   Cardiac Catheterization: 03/2020  Mid Cx lesion is 50% stenosed.  LV end diastolic pressure is mildly elevated.  There is mild aortic valve stenosis. Calculated valve area 1.56 cm2.  Ao sat 98%, PA sat 73%, PA pressure 39/5, mean PA pressure 19 mm Hg; mean PCWP 14 mm Hg; CO 6.5 L/min; CI 3.3.  Small radial artery. Ulnar access obtained. Vasospasm noted.   Nonobstructive CAD.  Mild aortic stenosis.    Will hydrate gently  post cath given renal insufficiency.  Ultimately, may need diuresis.     Assessment:    1. Nonrheumatic aortic valve stenosis   2. Chronic diastolic heart failure ([place])  3. Coronary artery disease involving native coronary artery of native heart without angina pectoris   4. Essential hypertension   5. Stage 3b chronic kidney disease ([place])      Plan:   In order of problems listed above:  1. Aortic Stenosis - Limited echocardiogram during her recent admission showed moderate to severe AS but was mild by catheterization. She is planning to undergo further CT imaging tomorrow as previously arranged by the Structural Heart Team and the timing and instructions surrounding this were reviewed with the patient again today.   2. Chronic Diastolic CHF - She does have 1+ pitting edema on examination but lungs are clear. She has not been taking Lasix and her weight has increased by 7 lbs since hospital discharge. I encouraged her to restart her Lasix tomorrow after her CT Scan. We reviewed the importance of limiting her sodium intake and she has been trying to monitor this more closely.   3. CAD - She had nonobstructive CAD by catheterization during recent admission with 50% LCx stenosis. She denies any recent anginal symptoms. Continue Lipitor 20mg  daily.   4. HTN - BP was initially elevated to 172/74, rechecked after sitting for several minutes and improved to 142/72. She is currently listed as taking Amlodipine 5mg  daily and Hydralazine 25mg  TID but has only been taking Hydralazine once daily. Encouraged her to try to take this three times daily. If BP remains above goal, would further titrate Hydralazine or switch to BID dosing if compliance with TID dosing becomes an issue.  5. Stage 3 CKD - Baseline creatinine 1.1 - 1.2. Was elevated to 1.44 during her recent admission and improved to 1.24 at discharge. Was going to recheck today but the lab was already closed. Appears she is scheduled to  have a repeat BMET tomorrow prior to her CT.     Medication Adjustments/Labs and Tests Ordered: Current medicines are reviewed at length with the patient today.  Concerns regarding medicines are outlined above.  Medication changes, Labs and Tests ordered today are listed in the Patient Instructions below. Patient Instructions  Medication Instructions:  Re-start Lasix 20 mg daily- (START TOMORROW AFTER YOUR CT)  Take Hydralazine 25 mg THREE times a day     *If you need a refill on your cardiac medications before your next appointment, please call your pharmacy*   Lab Work: None today If you have labs (blood work) drawn today and your tests are completely normal, you will receive your results only by: Marland Kitchen MyChart Message (if you have MyChart) OR . A paper copy in the mail If you have any lab test that is abnormal or we need to change your treatment, we will call you to review the results.   Testing/Procedures: None today   Follow-Up: At [facility], you and your health needs are our priority.  As part of our continuing mission to provide you with exceptional heart care, we have created designated Provider Care Teams.  These Care Teams include your primary Cardiologist (physician) and Advanced Practice Providers (APPs -  Physician Assistants and Nurse Practitioners) who all work together to provide you with the care you need, when you need it.  We recommend signing up for the patient portal called "MyChart".  Sign up information is provided on this After Visit Summary.  MyChart is used to connect with patients for Virtual Visits (Telemedicine).  Patients are able to view lab/test results, encounter notes, upcoming appointments, etc.  Non-urgent messages can be sent to your provider  as well.   To learn more about what you can do with MyChart, go to [email or web address].    Your next appointment:   2 month(s)  The format for your next appointment:   In Person  Provider:    You may see Rozann Lesches, MD or one of the following Advanced Practice Providers on your designated Care Team:    Bernerd Pho, PA-C         Other Instructions None        Signed, Waynetta Pean  04/24/2020 7:36 PM    [facility] [address] [place], [place] [address] Phone: [phone] Fax: [phone]

## 2020-04-24 NOTE — Patient Instructions (Signed)
Medication Instructions:  Re-start Lasix 20 mg daily- (START TOMORROW AFTER YOUR CT)  Take Hydralazine 25 mg THREE times a day     *If you need a refill on your cardiac medications before your next appointment, please call your pharmacy*   Lab Work: None today If you have labs (blood work) drawn today and your tests are completely normal, you will receive your results only by: Marland Kitchen MyChart Message (if you have MyChart) OR . A paper copy in the mail If you have any lab test that is abnormal or we need to change your treatment, we will call you to review the results.   Testing/Procedures: None today   Follow-Up: At [facility], you and your health needs are our priority.  As part of our continuing mission to provide you with exceptional heart care, we have created designated Provider Care Teams.  These Care Teams include your primary Cardiologist (physician) and Advanced Practice Providers (APPs -  Physician Assistants and Nurse Practitioners) who all work together to provide you with the care you need, when you need it.  We recommend signing up for the patient portal called "MyChart".  Sign up information is provided on this After Visit Summary.  MyChart is used to connect with patients for Virtual Visits (Telemedicine).  Patients are able to view lab/test results, encounter notes, upcoming appointments, etc.  Non-urgent messages can be sent to your provider as well.   To learn more about what you can do with MyChart, go to [email or web address].    Your next appointment:   2 month(s)  The format for your next appointment:   In Person  Provider:   You may see Rozann Lesches, MD or one of the following Advanced Practice Providers on your designated Care Team:    Bernerd Pho, PA-C         Other Instructions None

## 2020-04-25 ENCOUNTER — Ambulatory Visit (HOSPITAL_COMMUNITY)
Admission: RE | Admit: 2020-04-25 | Discharge: 2020-04-25 | Disposition: A | Discharge status: Home / Self Care | Payer: Medicare HMO | Source: Ambulatory Visit | Attending: Physician Assistant | Admitting: Physician Assistant

## 2020-04-25 ENCOUNTER — Encounter (HOSPITAL_COMMUNITY): Payer: Self-pay

## 2020-04-25 ENCOUNTER — Ambulatory Visit (HOSPITAL_COMMUNITY)
Admission: RE | Admit: 2020-04-25 | Discharge: 2020-04-25 | Disposition: A | Discharge status: Home / Self Care | Payer: Medicare HMO | Source: Ambulatory Visit | Attending: Cardiovascular Disease | Admitting: Cardiovascular Disease

## 2020-04-25 DIAGNOSIS — I350 Nonrheumatic aortic (valve) stenosis: Secondary | ICD-10-CM

## 2020-04-25 LAB — BASIC METABOLIC PANEL
Anion gap: 6 (ref 5–15)
BUN: 18 mg/dL (ref 8–23)
CO2: 29 mmol/L (ref 22–32)
Calcium: 10.2 mg/dL (ref 8.9–10.3)
Chloride: 102 mmol/L (ref 98–111)
Creatinine, Ser: 1.19 mg/dL — ABNORMAL HIGH (ref 0.44–1.00)
GFR, Estimated: 46 mL/min — ABNORMAL LOW (ref >60)
Glucose, Bld: 217 mg/dL — ABNORMAL HIGH (ref 70–99)
Potassium: 4.5 mmol/L (ref 3.5–5.1)
Sodium: 137 mmol/L (ref 135–145)

## 2020-04-25 MED ORDER — SODIUM CHLORIDE 0.9 % WEIGHT BASED INFUSION
3.0000 mL/kg/h | INTRAVENOUS | Status: DC
  Administered 2020-04-25: 2.999 mL/kg/h via INTRAVENOUS

## 2020-04-25 MED ORDER — IOHEXOL 350 MG/ML SOLN
100.0000 mL | Freq: Once | INTRAVENOUS | Status: AC | PRN
  Administered 2020-04-25: 100 mL via INTRAVENOUS

## 2020-04-25 MED ORDER — SODIUM CHLORIDE 0.9 % WEIGHT BASED INFUSION: 1.0000 mL/kg/h | INTRAVENOUS | Status: DC

## 2020-04-25 NOTE — Progress Notes (Signed)
[facility] in [place] notified that IVF started at 1035 and pt will be ready for CT at 1135

## 2020-04-28 ENCOUNTER — Telehealth: Payer: Self-pay | Admitting: Physician Assistant

## 2020-04-28 NOTE — Telephone Encounter (Signed)
Opal Sidles from [facility] called with results from TAVR scans:  3. 1.5 x 1.1 cm macrolobulated nodule in the anterior aspect of the right lower lobe with surrounding ground-glass attenuation, larger than prior study from 2015, highly concerning for primary bronchogenic adenocarcinoma. Further evaluation with PET-CT is recommended in the near future to better evaluate these findings. 4. 1.8 x 1.3 x 1.3 cm heterogeneously enhancing lesion in the interpolar region of the left kidney highly concerning for primary renal cell carcinoma. This is encapsulated within Gerota's fascia appears well separated from the left renal vein which is widely patent at this time. Nonemergent Urologic consultation is Recommended.  To Nell Range, PA.

## 2020-04-28 NOTE — Telephone Encounter (Signed)
I got it. Will discuss her at team meeting tomorrow. Thanks!

## 2020-04-29 ENCOUNTER — Other Ambulatory Visit: Payer: Self-pay | Admitting: Physician Assistant

## 2020-04-29 ENCOUNTER — Telehealth: Payer: Self-pay

## 2020-04-29 DIAGNOSIS — N2889 Other specified disorders of kidney and ureter: Secondary | ICD-10-CM

## 2020-04-29 DIAGNOSIS — R918 Other nonspecific abnormal finding of lung field: Secondary | ICD-10-CM

## 2020-04-29 NOTE — Telephone Encounter (Signed)
Thank you ! Did you also let her know about the MRI?

## 2020-04-29 NOTE — Telephone Encounter (Signed)
New message    Spoke with patient gave her instructions for [facility]

## 2020-05-12 ENCOUNTER — Other Ambulatory Visit: Payer: Self-pay

## 2020-05-12 ENCOUNTER — Encounter (HOSPITAL_COMMUNITY)
Admission: RE | Admit: 2020-05-12 | Discharge: 2020-05-12 | Disposition: A | Discharge status: Home / Self Care | Payer: Medicare HMO | Source: Ambulatory Visit | Attending: Physician Assistant | Admitting: Physician Assistant

## 2020-05-12 DIAGNOSIS — R918 Other nonspecific abnormal finding of lung field: Secondary | ICD-10-CM | POA: Insufficient documentation

## 2020-05-12 MED ORDER — FLUDEOXYGLUCOSE F - 18 (FDG) INJECTION
12.7000 | Freq: Once | INTRAVENOUS | Status: AC | PRN
  Administered 2020-05-12: 12.7 via INTRAVENOUS

## 2020-05-13 ENCOUNTER — Telehealth: Payer: Self-pay | Admitting: Physician Assistant

## 2020-05-13 NOTE — Telephone Encounter (Signed)
Got it. Thank you.

## 2020-05-13 NOTE — Telephone Encounter (Addendum)
[facility] called on physician line to NL triage Patient of K. Grandville Silos PA who had NM PET scan today Call report received regarding findings Report is in Epic Routed to ordering provider

## 2020-05-14 ENCOUNTER — Ambulatory Visit (HOSPITAL_COMMUNITY)
Admission: RE | Admit: 2020-05-14 | Discharge: 2020-05-14 | Disposition: A | Discharge status: Home / Self Care | Payer: Medicare HMO | Source: Ambulatory Visit | Attending: Physician Assistant | Admitting: Physician Assistant

## 2020-05-14 DIAGNOSIS — R918 Other nonspecific abnormal finding of lung field: Secondary | ICD-10-CM

## 2020-05-14 MED ORDER — GADOBUTROL 1 MMOL/ML IV SOLN
10.0000 mL | Freq: Once | INTRAVENOUS | Status: AC | PRN
  Administered 2020-05-14: 10 mL via INTRAVENOUS

## 2020-05-23 ENCOUNTER — Ambulatory Visit (INDEPENDENT_AMBULATORY_CARE_PROVIDER_SITE_OTHER): Payer: Medicare Other | Admitting: Urology

## 2020-05-23 ENCOUNTER — Encounter: Payer: Self-pay | Admitting: Urology

## 2020-05-23 ENCOUNTER — Other Ambulatory Visit: Payer: Self-pay

## 2020-05-23 VITALS — BP 141/74 | HR 48 | Temp 98.5°F | Ht 65.0 in | Wt 200.0 lb

## 2020-05-23 DIAGNOSIS — N2889 Other specified disorders of kidney and ureter: Secondary | ICD-10-CM | POA: Diagnosis not present

## 2020-05-23 NOTE — Patient Instructions (Signed)

## 2020-05-23 NOTE — Progress Notes (Signed)
05/23/2020 11:31 AM   [name] [birth year] [id number]  Referring provider: Eileen Stanford, PA-C [place],  [place] [id number]  Left renal mass  HPI: Ms Cotrell is 85yo here for evaluation of a left renal mass. She underwent CTA 04/25/2020 which showed a 1.8cm solid left renal mass. 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 No flank pain. No LUTS. No hematuria.    PMH: Past Medical History:  Diagnosis Date  . Aortic stenosis   . Arthritis   . Asthma   . CKD (chronic kidney disease)   . Essential hypertension   . Mixed hyperlipidemia   . Symptomatic anemia    Presumed GI bleed January 2021 - she declined GI work-up  . Type 2 diabetes mellitus [facility])     Surgical History: Past Surgical History:  Procedure Laterality Date  . APPENDECTOMY    . BACK SURGERY    . CYST REMOVAL HAND     Right, hospital in [place] ARTHROSCOPY WITH MEDIAL MENISECTOMY Right 02/28/2012   Procedure: KNEE ARTHROSCOPY WITH MEDIAL MENISECTOMY;  Surgeon: Carole Civil, MD;  Location: [facility];  Service: Orthopedics;  Laterality: Right;  . REPAIR KNEE LIGAMENT    . RIGHT/LEFT HEART CATH AND CORONARY ANGIOGRAPHY N/A 04/10/2020   Procedure: RIGHT/LEFT HEART CATH AND CORONARY ANGIOGRAPHY;  Surgeon: Jettie Booze, MD;  Location: [facility];  Service: Cardiovascular;  Laterality: N/A;    Home Medications:  Allergies as of 05/23/2020      Reactions   Amoxicillin Swelling   Tongue swelling   Macrobid [nitrofurantoin] Nausea And Vomiting   Penicillins Swelling   Has patient had a PCN reaction causing immediate rash, facial/tongue/throat swelling, SOB or lightheadedness with hypotension: No Has patient had a PCN reaction causing severe rash involving mucus membranes or skin necrosis: No Has patient had a PCN reaction that required hospitalization: No Has patient had a PCN reaction occurring within the last 10 years: No If all of the above  answers are "NO", then may proceed with Cephalosporin use.   Sulfa Antibiotics Swelling   Lip swelling      Medication List       Accurate as of May 23, 2020 11:31 AM. If you have any questions, ask your nurse or doctor.        albuterol (2.5 MG/3ML) 0.083% nebulizer solution Commonly known as: PROVENTIL Take 2.5 mg by nebulization every 6 (six) hours as needed for wheezing or shortness of breath.   amLODipine 5 MG tablet Commonly known as: NORVASC Take 1 tablet (5 mg total) by mouth daily.   atorvastatin 20 MG tablet Commonly known as: LIPITOR Take 20 mg by mouth every evening.   B-D UF III MINI PEN NEEDLES 31G X 5 MM Misc Generic drug: Insulin Pen Needle SMARTSIG:1 Each SUB-Q Daily   UltiCare Mini Pen Needles 31G X 6 MM Misc Generic drug: Insulin Pen Needle   furosemide 20 MG tablet Commonly known as: Lasix Take 1 tablet (20 mg total) by mouth daily.   gabapentin 100 MG capsule Commonly known as: NEURONTIN Take 1-2 capsules by mouth at bedtime.   hydrALAZINE 25 MG tablet Commonly known as: APRESOLINE Take 1 tablet (25 mg total) by mouth 3 (three) times daily.   Levemir FlexTouch 100 UNIT/ML FlexPen Generic drug: insulin detemir Inject 30 Units into the skin at bedtime. What changed:   how much to take  when to take this  metFORMIN 500 MG tablet Commonly known as: GLUCOPHAGE Take 1 tablet (500 mg total) by mouth 2 (two) times daily. Resume on 04/13/20.   metoprolol tartrate 50 MG tablet Commonly known as: LOPRESSOR Take 1 tablet (50 mg total) by mouth once for 1 dose. Please take 2 hr prior to CT scan       Allergies:  Allergies  Allergen Reactions  . Amoxicillin Swelling    Tongue swelling   . Macrobid [Nitrofurantoin] Nausea And Vomiting  . Penicillins Swelling    Has patient had a PCN reaction causing immediate rash, facial/tongue/throat swelling, SOB or lightheadedness with hypotension: No Has patient had a PCN reaction causing severe rash  involving mucus membranes or skin necrosis: No Has patient had a PCN reaction that required hospitalization: No Has patient had a PCN reaction occurring within the last 10 years: No If all of the above answers are "NO", then may proceed with Cephalosporin use.   . Sulfa Antibiotics Swelling    Lip swelling    Family History: Family History  Problem Relation Age of Onset  . Lung disease Other   . Cancer Other   . Arthritis Other   . Asthma Other   . Diabetes Other   . Diabetes Mother   . Hypertension Mother   . Diverticulitis Mother   . Diabetes Father   . Hypertension Sister   . Diabetes Brother   . Colon cancer Neg Hx     Social History:  reports that she has quit smoking. She quit after 7.00 years of use. She has never used smokeless tobacco. She reports that she does not drink alcohol and does not use drugs.  ROS: All other review of systems were reviewed and are negative except what is noted above in HPI  Physical Exam: BP (!) 141/74   Pulse (!) 48   Temp 98.5 F (36.9 C)   Ht 5\' 5"  (1.651 m)   Wt 200 lb (90.7 kg)   BMI 33.28 kg/m   Constitutional:  Alert and oriented, No acute distress. HEENT: [place] AT, moist mucus membranes.  Trachea midline, no masses. Cardiovascular: No clubbing, cyanosis, or edema. Respiratory: Normal respiratory effort, no increased work of breathing. GI: Abdomen is soft, nontender, nondistended, no abdominal masses GU: No CVA tenderness.  Lymph: No cervical or inguinal lymphadenopathy. Skin: No rashes, bruises or suspicious lesions. Neurologic: Grossly intact, no focal deficits, moving all 4 extremities. Psychiatric: Normal mood and affect.  Laboratory Data: Lab Results  Component Value Date   WBC 12.2 (H) 04/10/2020   HGB 12.9 04/10/2020   HCT 38.0 04/10/2020   MCV 88.8 04/10/2020   PLT 216 04/10/2020    Lab Results  Component Value Date   CREATININE 1.19 (H) 04/25/2020    No results found for: PSA  No results found for:  TESTOSTERONE  Lab Results  Component Value Date   HGBA1C 7.3 (H) 02/10/2019    Urinalysis    Component Value Date/Time   COLORURINE YELLOW 02/09/2019 1624   APPEARANCEUR HAZY (A) 02/09/2019 1624   LABSPEC 1.016 02/09/2019 1624   PHURINE 5.0 02/09/2019 1624   GLUCOSEU NEGATIVE 02/09/2019 1624   HGBUR NEGATIVE 02/09/2019 1624   BILIRUBINUR NEGATIVE 02/09/2019 1624   KETONESUR NEGATIVE 02/09/2019 1624   PROTEINUR NEGATIVE 02/09/2019 1624   UROBILINOGEN 0.2 04/25/2014 0936   NITRITE NEGATIVE 02/09/2019 1624   LEUKOCYTESUR LARGE (A) 02/09/2019 1624    Lab Results  Component Value Date   BACTERIA RARE (A) 02/09/2019  Pertinent Imaging: CTA 04/26/2019: Images reviewed and discussed with the patient No results found for this or any previous visit.  No results found for this or any previous visit.  No results found for this or any previous visit.  No results found for this or any previous visit.  No results found for this or any previous visit.  No results found for this or any previous visit.  No results found for this or any previous visit.  No results found for this or any previous visit.   Assessment & Plan:    1. Renal mass -We discussed the natural hx of renal masses and the 80/20 malignant/benign likelihood. We disucssed the treatment options including active surveillance. Renal ablation, partial and radical nephrectomy. After discussing the options the patient elects for surveillance.   - Urinalysis, Routine w reflex microscopic   No follow-ups on file.  Nicolette Bang, MD  [facility] [place]

## 2020-05-23 NOTE — Progress Notes (Signed)
Urological Symptom Review  Patient is experiencing the following symptoms: Urinary tract infection   Review of Systems  Gastrointestinal (upper)  : Negative for upper GI symptoms  Gastrointestinal (lower) : Negative for lower GI symptoms  Constitutional : Negative for symptoms  Skin: Negative for skin symptoms  Eyes: Negative for eye symptoms  Ear/Nose/Throat : Negative for Ear/Nose/Throat symptoms  Hematologic/Lymphatic: Negative for Hematologic/Lymphatic symptoms  Cardiovascular : Leg swelling  Respiratory : Negative for respiratory symptoms  Endocrine: Negative for endocrine symptoms  Musculoskeletal: Back pain  Neurological: Negative for neurological symptoms  Psychologic: Negative for psychiatric symptoms

## 2020-05-24 LAB — URINALYSIS, ROUTINE W REFLEX MICROSCOPIC
Bilirubin, UA: NEGATIVE
Glucose, UA: NEGATIVE
Ketones, UA: NEGATIVE
Nitrite, UA: NEGATIVE
Protein,UA: NEGATIVE
RBC, UA: NEGATIVE
Specific Gravity, UA: 1.015 (ref 1.005–1.030)
Urobilinogen, Ur: 0.2 mg/dL (ref 0.2–1.0)
pH, UA: 5.5 (ref 5.0–7.5)

## 2020-05-24 LAB — MICROSCOPIC EXAMINATION: Bacteria, UA: NONE SEEN

## 2020-06-11 ENCOUNTER — Other Ambulatory Visit: Payer: Self-pay

## 2020-06-11 ENCOUNTER — Institutional Professional Consult (permissible substitution) (INDEPENDENT_AMBULATORY_CARE_PROVIDER_SITE_OTHER): Payer: Medicare Other | Admitting: Surgery

## 2020-06-11 VITALS — BP 175/90 | HR 45 | Resp 20 | Ht 65.0 in | Wt 200.0 lb

## 2020-06-11 DIAGNOSIS — I350 Nonrheumatic aortic (valve) stenosis: Secondary | ICD-10-CM | POA: Diagnosis not present

## 2020-06-11 NOTE — Progress Notes (Signed)
Patient ID: Stephanie [name], female   DOB: 11-Aug-1935, 85 y.o.   MRN: [id number]  [place] SURGERY CONSULTATION REPORT  Referring Provider is Satira Sark, MD Primary Cardiologist is Rozann Lesches, MD PCP is Abran Richard, MD  Chief Complaint  Patient presents with  . Aortic Stenosis    Surgical consult for TAVR, review all testing    HPI:  The patient is an 85 year old woman with a history of hypertension, hyperlipidemia, type 2 diabetes, COPD, anemia presumed to be from GI bleed, chronic kidney disease, and aortic stenosis who presented to [facility] on 04/08/2020 for evaluation of worsening shortness of breath with exertion, orthopnea, and PND.  Chest x-ray showed congestive heart failure and her BNP was elevated at 649.  She was felt to have flash pulmonary edema and was started on intravenous Lasix.  2D echocardiogram showed a trileaflet aortic valve with moderate calcification and thickening of the leaflets.  The mean gradient was measured at 25 mmHg with a peak gradient of 44 mmHg.  Aortic valve area by VTI was 0.81 cm with a dimensionless index of 0.26 and a stroke-volume index of 32.  Left ventricular ejection fraction was 55 to 60% with grade 2 diastolic dysfunction.  She was transferred to [facility] and underwent cardiac catheterization showing nonobstructive coronary disease with 50% left circumflex stenosis.  The aortic valve mean gradient was measured at 30 mmHg with a peak gradient of 26 mmHg.  Aortic valve area was measured at 1.56 cm.  Cardiac index was 3.3.  LVEDP was 18.  Right heart pressures were within normal limits.  She was discharged home on Lasix 20 mg daily.  She is here today with her daughter.  She reports that she has been feeling fairly well since going home.  She still has some shortness of breath with moderate exertion and some lower extremity edema.  She denies any chest  pain or pressure.  She denies orthopnea.  She has had no dizziness or syncope.  Past Medical History:  Diagnosis Date  . Aortic stenosis   . Arthritis   . Asthma   . CKD (chronic kidney disease)   . Essential hypertension   . Mixed hyperlipidemia   . Symptomatic anemia    Presumed GI bleed January 2021 - she declined GI work-up  . Type 2 diabetes mellitus ([place])     Past Surgical History:  Procedure Laterality Date  . APPENDECTOMY    . BACK SURGERY    . CYST REMOVAL HAND     Right, hospital in [place] ARTHROSCOPY WITH MEDIAL MENISECTOMY Right 02/28/2012   Procedure: KNEE ARTHROSCOPY WITH MEDIAL MENISECTOMY;  Surgeon: Carole Civil, MD;  Location: [facility];  Service: Orthopedics;  Laterality: Right;  . REPAIR KNEE LIGAMENT    . RIGHT/LEFT HEART CATH AND CORONARY ANGIOGRAPHY N/A 04/10/2020   Procedure: RIGHT/LEFT HEART CATH AND CORONARY ANGIOGRAPHY;  Surgeon: Jettie Booze, MD;  Location: [facility];  Service: Cardiovascular;  Laterality: N/A;    Family History  Problem Relation Age of Onset  . Lung disease Other   . Cancer Other   . Arthritis Other   . Asthma Other   . Diabetes Other   . Diabetes Mother   . Hypertension Mother   . Diverticulitis Mother   . Diabetes Father   . Hypertension Sister   . Diabetes Brother   . Colon cancer Neg Hx  Social History   Socioeconomic History  . Marital status: Divorced    Spouse name: Not on file  . Number of children: 3  . Years of education: Not on file  . Highest education level: Not on file  Occupational History  . Occupation: Retired-cook   Tobacco Use  . Smoking status: Former Smoker    Years: 7.00  . Smokeless tobacco: Never Used  Vaping Use  . Vaping Use: Never used  Substance and Sexual Activity  . Alcohol use: No  . Drug use: No  . Sexual activity: Never  Other Topics Concern  . Not on file  Social History Narrative   ** Merged History Encounter **       Social Determinants of  Health   Financial Resource Strain: Not on file  Food Insecurity: Not on file  Transportation Needs: Not on file  Physical Activity: Not on file  Stress: Not on file  Social Connections: Not on file  Intimate Partner Violence: Not on file    Current Outpatient Medications  Medication Sig Dispense Refill  . albuterol (PROVENTIL) (2.5 MG/3ML) 0.083% nebulizer solution Take 2.5 mg by nebulization every 6 (six) hours as needed for wheezing or shortness of breath.   2  . amLODipine (NORVASC) 5 MG tablet Take 1 tablet (5 mg total) by mouth daily. 90 tablet 3  . atorvastatin (LIPITOR) 20 MG tablet Take 20 mg by mouth every evening.     . B-D UF III MINI PEN NEEDLES 31G X 5 MM MISC SMARTSIG:1 Each SUB-Q Daily    . furosemide (LASIX) 20 MG tablet Take 1 tablet (20 mg total) by mouth daily. 90 tablet 3  . gabapentin (NEURONTIN) 100 MG capsule Take 1-2 capsules by mouth at bedtime.    . hydrALAZINE (APRESOLINE) 25 MG tablet Take 1 tablet (25 mg total) by mouth 3 (three) times daily. 270 tablet 3  . Insulin Detemir (LEVEMIR FLEXTOUCH) 100 UNIT/ML Pen Inject 30 Units into the skin at bedtime. (Patient taking differently: Inject 32 Units into the skin daily.) 15 mL 11  . metFORMIN (GLUCOPHAGE) 500 MG tablet Take 1 tablet (500 mg total) by mouth 2 (two) times daily. Resume on 04/13/20.    . metoprolol tartrate (LOPRESSOR) 50 MG tablet Take 1 tablet (50 mg total) by mouth once for 1 dose. Please take 2 hr prior to CT scan 1 tablet 0  . ULTICARE MINI PEN NEEDLES 31G X 6 MM MISC      No current facility-administered medications for this visit.    Allergies  Allergen Reactions  . Amoxicillin Swelling    Tongue swelling   . Macrobid [Nitrofurantoin] Nausea And Vomiting  . Penicillins Swelling    Has patient had a PCN reaction causing immediate rash, facial/tongue/throat swelling, SOB or lightheadedness with hypotension: No Has patient had a PCN reaction causing severe rash involving mucus membranes or  skin necrosis: No Has patient had a PCN reaction that required hospitalization: No Has patient had a PCN reaction occurring within the last 10 years: No If all of the above answers are "NO", then may proceed with Cephalosporin use.   . Sulfa Antibiotics Swelling    Lip swelling      Review of Systems:   General:  normal appetite, + decreased energy, no weight gain, no weight loss, no fever  Cardiac:  no chest pain with exertion, no chest pain at rest, +SOB with moderate exertion, no resting SOB, no PND, no orthopnea, no palpitations, no arrhythmia, no  atrial fibrillation, + LE edema, no dizzy spells, no syncope  Respiratory:  + exertional shortness of breath, no home oxygen, no productive cough, no dry cough, + bronchitis, no wheezing, no hemoptysis, + asthma, no pain with inspiration or cough, no sleep apnea, no CPAP at night  GI:   no difficulty swallowing, no reflux, no frequent heartburn, no hiatal hernia, no abdominal pain, no constipation, no diarrhea, no hematochezia, no hematemesis, no melena  GU:   no dysuria,  no frequency, no urinary tract infection, no hematuria, no kidney stones, no kidney disease  Vascular:  no pain suggestive of claudication, no pain in feet, no leg cramps, no varicose veins, no DVT, no non-healing foot ulcer  Neuro:   no stroke, no TIA's, no seizures, no headaches, no temporary blindness one eye,  no slurred speech, no peripheral neuropathy, no chronic pain, no instability of gait, no memory/cognitive dysfunction  Musculoskeletal: + arthritis, no joint swelling, no myalgias, no difficulty walking, normal mobility   Skin:   no rash, no itching, no skin infections, no pressure sores or ulcerations  Psych:   no anxiety, no depression, no nervousness, no unusual recent stress  Eyes:   no blurry vision, no floaters, no recent vision changes, + wears glasses   ENT:   no hearing loss, no loose or painful teeth, no dentures, last saw dentist this  year  Hematologic:  no easy bruising, no abnormal bleeding, no clotting disorder, no frequent epistaxis  Endocrine:  + diabetes, does check CBG's at home           Physical Exam:   BP (!) 175/90   Pulse (!) 45   Resp 20   Ht 5\' 5"  (1.651 m)   Wt 200 lb (90.7 kg)   SpO2 93% Comment: RA  BMI 33.28 kg/m   General:  Elderly but  well-appearing  HEENT:  Unremarkable, NCAT, PERLA, EOMI  Neck:   no JVD, no bruits, no adenopathy   Chest:   clear to auscultation, symmetrical breath sounds, no wheezes, no rhonchi   CV:   RRR, grade lll/VI crescendo/decrescendo murmur heard best at RSB,  no diastolic murmur  Abdomen:  soft, non-tender, no masses   Extremities:  warm, well-perfused, pulses palpable at ankle, mild LE edema in ankle  Rectal/GU  Deferred  Neuro:   Grossly non-focal and symmetrical throughout  Skin:   Clean and dry, no rashes, no breakdown   Diagnostic Tests:  ECHOCARDIOGRAM LIMITED REPORT       Patient Name:  MANVIR THORSON Date of Exam: 04/09/2020  Medical Rec #: [id number]     Height:    65.0 in  Accession #:  [id number]    Weight:    198.2 lb  Date of Birth: 1935-10-05     BSA:     1.971 m  Patient Age:  42 years     BP:      143/70 mmHg  Patient Gender: F         HR:      69 bpm.  Exam Location: [facility]   Procedure: Limited Echo   Indications:  Aortic stenosis I35.0    History:    Patient has prior history of Echocardiogram examinations,  most         recent 02/22/2020. Signs/Symptoms:Dyspnea; Risk         Factors:Non-Smoker, Diabetes, Dyslipidemia and  Hypertension.         Sepsis.    Sonographer:  Leavy Cella RDCS (AE)  Referring Phys: [id number] [place]    1. Left ventricular ejection fraction, by estimation, is 55 to 60%. The  left ventricle has normal function. Left ventricular diastolic parameters  are consistent with  Grade II diastolic dysfunction (pseudonormalization).  Elevated left atrial pressure.  2. The pericardial effusion is circumferential.  3. Moderate mitral annular calcification.  4. Moderate aortic stenosis by gradient, severe by AVA VTI. Borderline  dimensionless index for severe at 0.26. Lower stroke volume index likely  contributing to lower than expected gradient. Essentially moderate to  severe AS more toward the severe side  of the spectrum.. The aortic valve is tricuspid. There is moderate  calcification of the aortic valve. There is moderate thickening of the  aortic valve.   FINDINGS  Left Ventricle: Left ventricular ejection fraction, by estimation, is 55  to 60%. The left ventricle has normal function. Left ventricular diastolic  parameters are consistent with Grade II diastolic dysfunction  (pseudonormalization). Elevated left atrial  pressure.   Pericardium: Trivial pericardial effusion is present. The pericardial  effusion is circumferential.   Mitral Valve: There is mild thickening of the mitral valve leaflet(s).  There is mild calcification of the mitral valve leaflet(s). Moderate  mitral annular calcification. The mean mitral valve gradient is 3.3 mmHg  with average heart rate of 65 bpm.   Aortic Valve: Moderate aortic stenosis by gradient, severe by AVA VTI.  Borderline dimensionless index for severe at 0.26. Lower stroke volume  index likely contributing to lower than expected gradient. Essentially  moderate to severe AS more toward the  severe side of the spectrum. The aortic valve is tricuspid. There is  moderate calcification of the aortic valve. There is moderate thickening  of the aortic valve. There is moderate aortic valve annular calcification.  Aortic valve mean gradient measures  25.1 mmHg. Aortic valve peak gradient measures 43.8 mmHg. Aortic valve  area, by VTI measures 0.81 cm.   Pulmonic Valve: The pulmonic valve was not well visualized.  Pulmonic valve  regurgitation is not visualized. No evidence of pulmonic stenosis.   LEFT VENTRICLE  PLAX 2D  LVIDd:     4.73 cm Diastology  LVIDs:     3.48 cm LV e' medial:  4.06 cm/s  LV PW:     1.22 cm LV E/e' medial: 36.0  LV IVS:    1.51 cm LV e' lateral:  2.94 cm/s  LVOT diam:   2.00 cm LV E/e' lateral: 49.7  LV SV:     63  LV SV Index:  32  LVOT Area:   3.14 cm     LEFT ATRIUM      Index  LA diam:   4.20 cm 2.13 cm/m  LA Vol (A4C): 62.5 ml 31.72 ml/m  AORTIC VALVE  AV Area (Vmax):  0.74 cm  AV Area (Vmean):  0.61 cm  AV Area (VTI):   0.81 cm  AV Vmax:      330.94 cm/s  AV Vmean:     234.927 cm/s  AV VTI:      0.787 m  AV Peak Grad:   43.8 mmHg  AV Mean Grad:   25.1 mmHg  LVOT Vmax:     78.04 cm/s  LVOT Vmean:    45.914 cm/s  LVOT VTI:     0.202 m  LVOT/AV VTI ratio: 0.26    AORTA  Ao Root diam: 2.90 cm   MITRAL VALVE  MV Area (PHT): 2.07 cm   SHUNTS  MV Mean grad: 3.3 mmHg   Systemic VTI: 0.20 m  MV Decel Time: 367 msec   Systemic Diam: 2.00 cm  MV E velocity: 146.00 cm/s  MV A velocity: 86.90 cm/s  MV E/A ratio: 1.68   Carlyle Dolly MD  Electronically signed by Carlyle Dolly MD  Signature Date/Time: 04/09/2020/12:58:04 PM    Physicians  Panel Physicians Referring Physician Case Authorizing Physician  Jettie Booze, MD (Primary)      Procedures  RIGHT/LEFT HEART CATH AND CORONARY ANGIOGRAPHY   Conclusion    Mid Cx lesion is 50% stenosed.  LV end diastolic pressure is mildly elevated.  There is mild aortic valve stenosis. Calculated valve area 1.56 cm2.  Ao sat 98%, PA sat 73%, PA pressure 39/5, mean PA pressure 19 mm Hg; mean PCWP 14 mm Hg; CO 6.5 L/min; CI 3.3.  Small radial artery. Ulnar access obtained. Vasospasm noted.   Nonobstructive CAD.  Mild aortic stenosis.    Will hydrate gently post cath given renal insufficiency.   Ultimately, may need diuresis.      Indications  Nonrheumatic aortic valve stenosis [I35.0 (ICD-10-CM)]   Procedural Details  Technical Details The risks, benefits, and details of the procedure were explained to the patient.  The patient verbalized understanding and wanted to proceed.  Informed written consent was obtained.  PROCEDURE TECHNIQUE:  After Xylocaine anesthesia, a 7 French sheath was placed in the right antecubital area in exchange for a peripheral IV. A 7 French balloontipped Swan-Ganz catheter was advanced to the pulmonary artery under fluoroscopic guidance. Hemodynamic pressures were obtained. Oxygen saturations were obtained. Radial artery was small by ultrasound.  After Xylocaine anesthesia, a 73F sheath was placed in the right ulnar artery with an anterior needle wall stick using ultrasound guidance.   Left heart cath was done using a JR3.5 catheter. Right coronary angiography was done using a Judkins R4 guide catheter.  Left coronary angiography was done using a Judkins L4 guide catheter.  TR band for hemostasis.  Aortic valve was crossed with a J wire.    Of note, stress test was negative in 12/21 so FFR of circumflex was not done.  Contrast dye usage was minimized.  Contrast: 50 cc  Estimated blood loss <50 mL.   During this procedure medications were administered to achieve and maintain moderate conscious sedation while the patient's heart rate, blood pressure, and oxygen saturation were continuously monitored and I was present face-to-face 100% of this time.   Medications (Filter: Administrations occurring from 1358 to 1531 on 04/10/20) (important) Continuous medications are totaled by the amount administered until 04/10/20 1531.    fentaNYL (SUBLIMAZE) injection (mcg) Total dose:  50 mcg  Date/Time Rate/Dose/Volume Action   04/10/20 1431 25 mcg Given   1442 25 mcg Given    midazolam (VERSED) injection (mg) Total dose:  2 mg  Date/Time Rate/Dose/Volume  Action   04/10/20 1431 1 mg Given   1443 1 mg Given    lidocaine (PF) (XYLOCAINE) 1 % injection (mL) Total volume:  4 mL  Date/Time Rate/Dose/Volume Action   04/10/20 1439 2 mL Given   1441 2 mL Given    heparin sodium (porcine) injection (Units) Total dose:  4,500 Units  Date/Time Rate/Dose/Volume Action   04/10/20 1458 4,500 Units Given    Radial Cocktail/Verapamil only (mL) Total volume:  10 mL  Date/Time Rate/Dose/Volume Action   04/10/20 1448 10 mL Given    iohexol (OMNIPAQUE) 350 MG/ML injection (mL) Total volume:  50 mL  Date/Time Rate/Dose/Volume Action   04/10/20 1528 50 mL Given    albuterol (PROVENTIL) (2.5 MG/3ML) 0.083% nebulizer solution 2.5 mg (mg) Total dose:  Cannot be calculated* Dosing weight:  91  *Administration dose not documented Date/Time Rate/Dose/Volume Action   04/10/20 1358 *Not included in total MAR Hold    amLODipine (NORVASC) tablet 5 mg (mg) Total dose:  Cannot be calculated*  *Administration dose not documented Date/Time Rate/Dose/Volume Action   04/10/20 1358 *Not included in total MAR Hold    atorvastatin (LIPITOR) tablet 20 mg (mg) Total dose:  Cannot be calculated* Dosing weight:  91  *Administration dose not documented Date/Time Rate/Dose/Volume Action   04/10/20 1358 *Not included in total MAR Hold    enoxaparin (LOVENOX) injection 40 mg (mg) Total dose:  Cannot be calculated* Dosing weight:  91  *Administration dose not documented Date/Time Rate/Dose/Volume Action   04/10/20 1358 *Not included in total MAR Hold    gabapentin (NEURONTIN) capsule 100-200 mg (mg) Total dose:  Cannot be calculated*  *Administration dose not documented Date/Time Rate/Dose/Volume Action   04/10/20 1358 *Not included in total MAR Hold    hydrALAZINE (APRESOLINE) tablet 25 mg (mg) Total dose:  Cannot be calculated* Dosing weight:  89.9  *Administration dose not documented Date/Time Rate/Dose/Volume Action   04/10/20 1358 *Not  included in total MAR Hold   1400 *Not included in total Automatically Held    insulin aspart (novoLOG) injection 0-15 Units (Units) Total dose:  Cannot be calculated* Dosing weight:  91  *Administration dose not documented Date/Time Rate/Dose/Volume Action   04/10/20 1358 *Not included in total MAR Hold    insulin aspart (novoLOG) injection 0-5 Units (Units) Total dose:  Cannot be calculated* Dosing weight:  91  *Administration dose not documented Date/Time Rate/Dose/Volume Action   04/10/20 1358 *Not included in total MAR Hold    insulin detemir (LEVEMIR) injection 32 Units (Units) Total dose:  Cannot be calculated* Dosing weight:  91  *Administration dose not documented Date/Time Rate/Dose/Volume Action   04/10/20 1358 *Not included in total MAR Hold    polyethylene glycol (MIRALAX / GLYCOLAX) packet 17 g (g) Total dose:  Cannot be calculated* Dosing weight:  91  *Administration dose not documented Date/Time Rate/Dose/Volume Action   04/10/20 1358 *Not included in total MAR Hold    predniSONE (DELTASONE) tablet 50 mg (mg) Total dose:  Cannot be calculated* Dosing weight:  91  *Administration dose not documented Date/Time Rate/Dose/Volume Action   04/10/20 1358 *Not included in total MAR Hold    sodium chloride flush (NS) 0.9 % injection 3 mL (mL) Total dose:  Cannot be calculated* Dosing weight:  89.9  *Administration dose not documented Date/Time Rate/Dose/Volume Action   04/10/20 1358 *Not included in total MAR Hold   1430 *Not included in total Automatically Held    Sedation Time  Sedation Time Physician-1: 40 minutes 3 seconds   Contrast  Medication Name Total Dose  iohexol (OMNIPAQUE) 350 MG/ML injection 50 mL    Radiation/Fluoro  Fluoro time: 5.3 (min) DAP: 16.6 (Gycm2) Cumulative Air Kerma: 607.3 (mGy)   Complications   Complications documented before study signed (04/10/2020 7:10 PM)    No complications were associated with this study.   Documented by Jettie Booze, MD - 04/10/2020 3:22 PM     Coronary Findings   Diagnostic Dominance: Right  Left Anterior Descending  The vessel exhibits minimal luminal irregularities.  Left Circumflex  The vessel exhibits minimal luminal irregularities.  Mid Cx lesion is 50% stenosed.  Right Coronary Artery  The vessel exhibits minimal luminal irregularities.   Intervention   No interventions have been documented.  Right Heart  Right Heart Pressures Ao sat 98%, PA sat 73%, PA pressure 39/5, mean PA pressure 19 mm Hg; mean PCWP 14 mm Hg; CO 6.5 L/min; CI 3.3.   Left Heart  Left Ventricle LV end diastolic pressure is mildly elevated.  Aortic Valve There is mild aortic valve stenosis.   Coronary Diagrams   Diagnostic Dominance: Right    Intervention    Implants    No implant documentation for this case.   Syngo Images  Show images for CARDIAC CATHETERIZATION  Images on Long Term Storage  Show images for Joelyn, Lover to Procedure Log  Procedure Log     Hemo Data  Flowsheet Row Most Recent Value  Fick Cardiac Output 6.54 L/min  Fick Cardiac Output Index 3.32 (L/min)/BSA  Aortic Mean Gradient 30.31 mmHg  Aortic Peak Gradient 26 mmHg  Aortic Valve Area 1.56  Aortic Value Area Index 0.79 cm2/BSA  RA A Wave 7 mmHg  RA V Wave 4 mmHg  RA Mean 4 mmHg  RV Systolic Pressure 40 mmHg  RV Diastolic Pressure 0 mmHg  RV EDP 3 mmHg  PA Systolic Pressure 39 mmHg  PA Diastolic Pressure 5 mmHg  PA Mean 19 mmHg  PW A Wave 17 mmHg  PW V Wave 18 mmHg  PW Mean 14 mmHg  LV Systolic Pressure 008 mmHg  LV Diastolic Pressure -3 mmHg  LV EDP 18 mmHg  AOp Systolic Pressure 676 mmHg  AOp Diastolic Pressure 51 mmHg  AOp Mean Pressure 76 mmHg  LVp Systolic Pressure 195 mmHg  LVp Diastolic Pressure 0 mmHg  LVp EDP Pressure 14 mmHg  QP/QS 1  TPVR Index 5.72 HRUI    ADDENDUM REPORT: 04/27/2020 15:59  CLINICAL DATA:  Aortic  stenosis  EXAM: Cardiac TAVR CT  TECHNIQUE: The patient was scanned on a Siemens Force 093 slice scanner. A 120 kV retrospective scan was triggered in the descending thoracic aorta at 111 HU's. Gantry rotation speed was 270 msecs and collimation was .9 mm. No beta blockade or nitro were given. The 3D data set was reconstructed in 5% intervals of the R-R cycle. Systolic and diastolic phases were analyzed on a dedicated work station using MPR, MIP and VRT modes. The patient received 100 cc of contrast.  FINDINGS: Aortic Valve: Moderately thickened aortic valve with moderate calcification and reduced excursion the planimeter valve area is 0.9 Sq cm consistent with severe aortic stenosis.  Number of leaflets: 3  LVOT calcification: none  Annular calcification: minimal  Aortic Valve Calcium Score: 645  Prosthetic Valve: NA  Mitral Valve: Moderate mitral annular calcification noted.  Left Atrium: Severe dilation noted.  Aortic Annulus Measurements  Major annulus diameter: 29 mm  Minor annulus diameter: 22 mm  Annular perimeter: 78 mm  Annular area: 4.6 cm2  Aortic Root Measurements- Diastole  Sinus of Valsalva perimeter: 105  Sinotubular Junction: 28 mm  Ascending Thoracic Aorta: 34 mm  Aortic Arch: 29 mm  Descending Thoracic Aorta:  34  Sinus of Valsalva Measurements (cusp to cusp):  Non-Right coronary cusp width: 32 mm  Right- Left-coronary cusp width: 26 mm  Left- Non- coronary cusp width: 32 mm  Coronary Artery Height above Annulus:  Left Main: 8 mm (low coronary height)  Right Coronary: 14 mm  Coronary Arteries: No nitroglycerin administered for non-coronary assessment.  Coronary Calcium Score:  Left main:  30  Left anterior descending artery: 317  Left circumflex artery: 290  Right coronary artery: 336  Total: 973  Percentile: 89th for age, sex, and race matched control.  Optimum Fluoroscopic  Angle for Delivery: Three cusp view: LAO 33, CAU 4; Anterior view: RAO 0, CAU 46.  Valves for structural team consideration: 29 mm CoreValve or 26 mm Edwards Sapien Valve  IMPRESSION: 1. Severe Aortic stenosis. Findings pertinent to TAVR procedure are detailed above.  2.  Cardiac silhouette is rotated.  3. Patient's total coronary artery calcium score is 973, which is 89th percentile for subjects of the same age, gender, and race based populations.  RECOMMENDATIONS:  Coronary artery calcium (CAC) score is a strong predictor of incident coronary heart disease (CHD) and provides predictive information beyond traditional risk factors. CAC scoring is reasonable to use in the decision to withhold, postpone, or initiate statin therapy in intermediate-risk or selected borderline-risk asymptomatic adults (age 3-75 years and LDL-C ?70 to <190 mg/dL) who do not have diabetes or established atherosclerotic cardiovascular disease (ASCVD).* In intermediate-risk (10-year ASCVD risk ?7.5% to <20%) adults or selected borderline-risk (10-year ASCVD risk ?5% to <7.5%) adults in whom a CAC score is measured for the purpose of making a treatment decision the following recommendations have been made:  If CAC = 0, it is reasonable to withhold statin therapy and reassess in 5 to 10 years, as long as higher risk conditions are absent (diabetes mellitus, family history of premature CHD in first degree relatives (males <55 years; females <65 years), cigarette smoking, LDL ?190 mg/dL or other independent risk factors).  If CAC is 1 to 99, it is reasonable to initiate statin therapy for patients ?83 years of age.  If CAC is ?100 or ?75th percentile, it is reasonable to initiate statin therapy at any age.  Cardiology referral should be considered for patients with CAC scores ?400 or ?75th percentile.  *2018 AHA/ACC/AACVPR/AAPA/ABC/ACPM/ADA/AGS/APhA/ASPC/NLA/PCNA Guideline on the  Management of Blood Cholesterol: A Report of the American College of Cardiology/American Heart Association Task Force on Clinical Practice Guidelines. J Am Coll Cardiol. 2019;73(24):3168-3209.  Mahesh  Chandrasekhar   Electronically Signed   By: Rudean Haskell MD   On: 04/27/2020 15:59  Narrative & Impression  CLINICAL DATA:  Initial treatment strategy for lung nodule.  EXAM: NUCLEAR MEDICINE PET SKULL BASE TO THIGH  TECHNIQUE: 12.7 mCi F-18 FDG was injected intravenously. Full-ring PET imaging was performed from the skull base to thigh after the radiotracer. CT data was obtained and used for attenuation correction and anatomic localization.  Fasting blood glucose: 240 mg/dl  COMPARISON:  CT chest 04/25/2020, 11/04/2013.  FINDINGS: Mediastinal blood pool activity: SUV max 3.1  Liver activity: SUV max 4.1  NECK: No hypermetabolic lymph nodes in the neck.  Incidental CT findings: none  CHEST: Persistent nodule in the RIGHT middle lobe measures 13 mm x 12 mm compared to 13 mm 12 mm on comparison exam 04/25/2020. Nodule increased in size and density from 11/04/2013. This nodule has low but measurable metabolic activity with SUV max equal 1.5.  No additional pulmonary nodularity.  No hypermetabolic mediastinal lymph nodes.  Incidental CT findings: Coronary artery calcification and aortic atherosclerotic calcification. Small pericardial effusion.  ABDOMEN/PELVIS: No abnormal hypermetabolic activity within the liver, pancreas, adrenal glands, or spleen. No hypermetabolic lymph nodes in the abdomen or pelvis.  Incidental CT findings: Thickening LEFT adrenal gland to 18 mm has low attenuation consistent benign adenoma. Simple fluid cyst of the RIGHT kidney.  Lesion  of concern in the LEFT kidney may have mild associated metabolic activity. Lesion difficult to define on noncontrast CT.  Atherosclerotic calcification of the aorta. Multiple  diverticula of the descending colon and sigmoid colon without acute inflammation.  SKELETON: No focal hypermetabolic activity to suggest skeletal metastasis.  Incidental CT findings: none  IMPRESSION: 1. Low metabolic activity associated with the RIGHT upper lobe nodule is not typical of bronchogenic carcinoma; however, low-grade adenocarcinoma can exhibit low metabolic activity. As lesion is increased in size/density from 2015 recommend tissue sampling. 2. No evidence of metastatic mediastinal lymphadenopathy. 3. Lesion of concerning lesion in the LEFT kidney may have metabolic activity however difficult to localize on this noncontrast CT study. Recommend dedicated renal MRI with contrast for further evaluation.  These results will be called to the ordering clinician or representative by the Radiologist Assistant, and communication documented in the PACS or [employer].   Electronically Signed   By: Suzy Bouchard M.D.   On: 05/13/2020 11:55    Narrative & Impression  CLINICAL DATA:  Lung nodule.  Possible renal mass.  EXAM: MRI HEAD WITHOUT AND WITH CONTRAST  TECHNIQUE: Multiplanar, multiecho pulse sequences of the brain and surrounding structures were obtained without and with intravenous contrast.  CONTRAST:  71mL GADAVIST GADOBUTROL 1 MMOL/ML IV SOLN  COMPARISON:  None.  FINDINGS: Brain: Mild atrophy and white matter changes are present bilaterally, likely within normal limits for age. The ventricles are proportionate to the degree of atrophy.  No acute infarct, hemorrhage, or mass lesion is present.  The internal auditory canals are within normal limits. The brainstem and cerebellum are within normal limits.  Postcontrast images demonstrate no pathologic enhancement.  Vascular: Flow is present in the major intracranial arteries.  Skull and upper cervical spine: The craniocervical junction is normal. Upper cervical spine is within  normal limits. Marrow signal is unremarkable.  Sinuses/Orbits: Left sphenoid sinus is opacified. Minimal mucosal thickening is present in the ethmoid air cells and inferior left maxillary sinus.  IMPRESSION: 1. No evidence for metastatic disease to the brain or meninges. 2. Normal MRI appearance of the brain for age. 3. Left sphenoid sinus disease.   Electronically Signed   By: San Morelle M.D.   On: 05/14/2020 11:48    RISK SCORES Procedure: Isolated AVR Risk of Mortality: 5.153% Renal Failure: 8.762% Permanent Stroke: 2.311% Prolonged Ventilation: 16.104% DSW Infection: 0.185% Reoperation: 4.167% Morbidity or Mortality: 23.941% Short Length of Stay: 15.424% Long Length of Stay: 14.657%  Impression:  This 85 year old patient has stage D, severe, symptomatic, low gradient, normal ejection fraction aortic stenosis with New York Heart Association class II symptoms of exertional fatigue and shortness of breath as well as lower extremity edema consistent with chronic diastolic congestive heart failure.  I have personally reviewed her 2D echocardiogram, cardiac catheterization, and CTA studies.  Her echocardiogram shows a trileaflet aortic valve with moderate thickening and calcification of the leaflets with a mean gradient of 25 mmHg and a valve area of 0.81 cm.  Dimensionless index 0.26.  Her lower gradient may be due to her lower stroke-volume index.  Cardiac catheterization shows mild nonobstructive coronary disease.  I agree that aortic valve replacement is indicated to prevent progressive left ventricular deterioration and improve her symptoms.  Given her advanced age I think transcatheter aortic valve replacement would be the best treatment.  Her gated cardiac CTA shows anatomy suitable for transcatheter aortic valve replacement using a SAPIEN 3 valve.  Her abdominal and pelvic CTA shows adequate  pelvic vascular anatomy to allow transfemoral insertion.  The  patient and her daughter were counseled at length regarding treatment alternatives for management of severe symptomatic aortic stenosis. The risks and benefits of surgical intervention has been discussed in detail. Long-term prognosis with medical therapy was discussed. Alternative approaches such as conventional surgical aortic valve replacement, transcatheter aortic valve replacement, and palliative medical therapy were compared and contrasted at length. This discussion was placed in the context of the patient's own specific clinical presentation and past medical history. All of their questions have been addressed.   Following the decision to proceed with transcatheter aortic valve replacement, a discussion was held regarding what types of management strategies would be attempted intraoperatively in the event of life-threatening complications, including whether or not the patient would be considered a candidate for the use of cardiopulmonary bypass and/or conversion to open sternotomy for attempted surgical intervention.  I think she would be a candidate for emergent sternotomy to manage any intraoperative complications depending on the situation.  The patient is aware of the fact that transient use of cardiopulmonary bypass may be necessary. The patient has been advised of a variety of complications that might develop including but not limited to risks of death, stroke, paravalvular leak, aortic dissection or other major vascular complications, aortic annulus rupture, device embolization, cardiac rupture or perforation, mitral regurgitation, acute myocardial infarction, arrhythmia, heart block or bradycardia requiring permanent pacemaker placement, congestive heart failure, respiratory failure, renal failure, pneumonia, infection, other late complications related to structural valve deterioration or migration, or other complications that might ultimately cause a temporary or permanent loss of functional  independence or other long term morbidity. The patient provides full informed consent for the procedure as described and all questions were answered.   Her preoperative CT scans did show a 1.5 x 1.1 cm macrolobulated nodule in the anterior aspect of the right lower lobe abutting the major fissure that was present in 2015 but is larger now and was felt to be concerning for slow-growing neoplasm.  There is also a 1.8 cm heterogeneously enhancing lesion in the interpolar region of the left kidney concerning for possible renal cell carcinoma.  She has already seen urology concerning this and the plan is to do a follow-up scan in a few months.  The patient did have a PET scan done on 05/12/2020 which showed low-level metabolic activity in the right lower lobe lung nodule with an SUV max of 1.5 that is not typical of bronchogenic carcinoma but could represent a low-grade adenocarcinoma although this lesion has been present for 7 years which would make this less likely.  There is no evidence of metastatic mediastinal lymphadenopathy.  The lesion in the left kidney was difficult to localize.  She had a brain MRI that was negative.  I think this lesion in her right lower lobe is more likely to be a benign nodule given its chronicity.  Since it has increased in size and density since 2015 it will require continued follow-up and she should have a follow-up CT scan of the chest in 4 to 6 months to follow-up on this lesion.  I reviewed the CT scan findings with the patient and her daughter and answered all of their questions.      Plan:  She will be scheduled for transfemoral transcatheter aortic valve replacement in the next few weeks using a SAPIEN 3 valve.  I spent 60 minutes performing this consultation and > 50% of this time was spent face to face  counseling and coordinating the care of this patient's severe symptomatic aortic stenosis.      Gaye Pollack, MD 06/11/2020 12:15 PM

## 2020-06-17 ENCOUNTER — Encounter: Payer: Self-pay | Admitting: Surgery

## 2020-06-20 ENCOUNTER — Other Ambulatory Visit: Payer: Self-pay

## 2020-06-20 DIAGNOSIS — I350 Nonrheumatic aortic (valve) stenosis: Secondary | ICD-10-CM

## 2020-06-26 NOTE — Pre-Procedure Instructions (Signed)
Surgical Instructions    Your procedure is scheduled on Tuesday, June 14th.  Report to [facility] Main Entrance "A" at 8:45 A.M., then check in with the Admitting office.  Call this number if you have problems the morning of surgery:  [phone]   If you have any questions prior to your surgery date call [phone]: Open Monday-Friday 8am-4pm    Remember:  Do not eat or drink after midnight the night before your surgery    Take NO MEDICATIONS the morning of surgery.  As of today, STOP taking any Aspirin (unless otherwise instructed by your surgeon) Aleve, Naproxen, Ibuprofen, Motrin, Advil, Goody's, BC's, all herbal medications, fish oil, and all vitamins.  WHAT DO I DO ABOUT MY DIABETES MEDICATION?   STOP metFORMIN (GLUCOPHAGE) as of Sunday, June 12th. Last dose will be Saturday, June 11th.  THE MORNING OF SURGERY, take 16 units of Insulin Detemir (LEVEMIR FLEXTOUCH) insulin.   HOW TO MANAGE YOUR DIABETES BEFORE AND AFTER SURGERY  Why is it important to control my blood sugar before and after surgery? Improving blood sugar levels before and after surgery helps healing and can limit problems. A way of improving blood sugar control is eating a healthy diet by:  Eating less sugar and carbohydrates  Increasing activity/exercise  Talking with your doctor about reaching your blood sugar goals High blood sugars (greater than 180 mg/dL) can raise your risk of infections and slow your recovery, so you will need to focus on controlling your diabetes during the weeks before surgery. Make sure that the doctor who takes care of your diabetes knows about your planned surgery including the date and location.  How do I manage my blood sugar before surgery? Check your blood sugar at least 4 times a day, starting 2 days before surgery, to make sure that the level is not too high or low.  Check your blood sugar the morning of your surgery when you wake up and every 2 hours until you get to  the Short Stay unit.  If your blood sugar is less than 70 mg/dL, you will need to treat for low blood sugar: Do not take insulin. Treat a low blood sugar (less than 70 mg/dL) with  cup of clear juice (cranberry or apple), 4 glucose tablets, OR glucose gel. Recheck blood sugar in 15 minutes after treatment (to make sure it is greater than 70 mg/dL). If your blood sugar is not greater than 70 mg/dL on recheck, call [phone] for further instructions. Report your blood sugar to the short stay nurse when you get to Short Stay.  If you are admitted to the hospital after surgery: Your blood sugar will be checked by the staff and you will probably be given insulin after surgery (instead of oral diabetes medicines) to make sure you have good blood sugar levels. The goal for blood sugar control after surgery is 80-180 mg/dL.           Do NOT Smoke (Tobacco/Vaping) or drink Alcohol 24 hours prior to your procedure.  If you use a CPAP at night, you may bring all equipment for your overnight stay.   Contacts, glasses, piercing's, hearing aid's, dentures or partials may not be worn into surgery, please bring cases for these belongings.    For patients admitted to the hospital, discharge time will be determined by your treatment team.   Patients discharged the day of surgery will not be allowed to drive home, and someone needs to stay with them for 24  hours.    Special instructions:   [facility]- Preparing For Surgery  Before surgery, you can play an important role. Because skin is not sterile, your skin needs to be as free of germs as possible. You can reduce the number of germs on your skin by washing with CHG (chlorahexidine gluconate) Soap before surgery.  CHG is an antiseptic cleaner which kills germs and bonds with the skin to continue killing germs even after washing.    Oral Hygiene is also important to reduce your risk of infection.  Remember - BRUSH YOUR TEETH THE MORNING OF SURGERY  WITH YOUR REGULAR TOOTHPASTE  Please do not use if you have an allergy to CHG or antibacterial soaps. If your skin becomes reddened/irritated stop using the CHG.  Do not shave (including legs and underarms) for at least 48 hours prior to first CHG shower. It is OK to shave your face.  Please follow these instructions carefully.   Shower the NIGHT BEFORE SURGERY and the MORNING OF SURGERY  If you chose to wash your hair, wash your hair first as usual with your normal shampoo.  After you shampoo, rinse your hair and body thoroughly to remove the shampoo.  Use CHG Soap as you would any other liquid soap. You can apply CHG directly to the skin and wash gently with a scrungie or a clean washcloth.   Apply the CHG Soap to your body ONLY FROM THE NECK DOWN.  Do not use on open wounds or open sores. Avoid contact with your eyes, ears, mouth and genitals (private parts). Wash Face and genitals (private parts)  with your normal soap.   Wash thoroughly, paying special attention to the area where your surgery will be performed.  Thoroughly rinse your body with warm water from the neck down.  DO NOT shower/wash with your normal soap after using and rinsing off the CHG Soap.  Pat yourself dry with a CLEAN TOWEL.  Wear CLEAN PAJAMAS to bed the night before surgery  Place CLEAN SHEETS on your bed the night before your surgery  DO NOT SLEEP WITH PETS.   Day of Surgery: Shower with CHG soap. Do not wear jewelry, make up, or nail polish Do not wear lotions, powders, perfumes, or deodorant. Do not shave 48 hours prior to surgery.   Do not bring valuables to the hospital. [facility] is not responsible for any belongings or valuables. Wear Clean/Comfortable clothing the morning of surgery Remember to brush your teeth WITH YOUR REGULAR TOOTHPASTE.   Please read over the following fact sheets that you were given.

## 2020-06-27 ENCOUNTER — Other Ambulatory Visit: Payer: Self-pay

## 2020-06-27 ENCOUNTER — Ambulatory Visit: Payer: Medicare HMO | Admitting: Student

## 2020-06-27 ENCOUNTER — Ambulatory Visit: Payer: Medicare Other | Attending: Cardiovascular Disease | Admitting: Physical Therapy

## 2020-06-27 ENCOUNTER — Ambulatory Visit (HOSPITAL_COMMUNITY)
Admission: RE | Admit: 2020-06-27 | Discharge: 2020-06-27 | Disposition: A | Discharge status: Home / Self Care | Payer: Medicare Other | Source: Ambulatory Visit | Attending: Cardiovascular Disease | Admitting: Cardiovascular Disease

## 2020-06-27 ENCOUNTER — Encounter (HOSPITAL_COMMUNITY): Payer: Self-pay

## 2020-06-27 ENCOUNTER — Encounter (HOSPITAL_COMMUNITY)
Admission: RE | Admit: 2020-06-27 | Discharge: 2020-06-27 | Disposition: A | Discharge status: Home / Self Care | Payer: Medicare Other | Source: Ambulatory Visit | Attending: Cardiovascular Disease | Admitting: Cardiovascular Disease

## 2020-06-27 ENCOUNTER — Encounter: Payer: Self-pay | Admitting: Physical Therapy

## 2020-06-27 DIAGNOSIS — N189 Chronic kidney disease, unspecified: Secondary | ICD-10-CM | POA: Insufficient documentation

## 2020-06-27 DIAGNOSIS — I120 Hypertensive chronic kidney disease with stage 5 chronic kidney disease or end stage renal disease: Secondary | ICD-10-CM | POA: Diagnosis not present

## 2020-06-27 DIAGNOSIS — R2689 Other abnormalities of gait and mobility: Secondary | ICD-10-CM | POA: Diagnosis not present

## 2020-06-27 DIAGNOSIS — Z7984 Long term (current) use of oral hypoglycemic drugs: Secondary | ICD-10-CM | POA: Insufficient documentation

## 2020-06-27 DIAGNOSIS — R911 Solitary pulmonary nodule: Secondary | ICD-10-CM | POA: Diagnosis not present

## 2020-06-27 DIAGNOSIS — I2510 Atherosclerotic heart disease of native coronary artery without angina pectoris: Secondary | ICD-10-CM | POA: Insufficient documentation

## 2020-06-27 DIAGNOSIS — Z6835 Body mass index (BMI) 35.0-35.9, adult: Secondary | ICD-10-CM | POA: Diagnosis not present

## 2020-06-27 DIAGNOSIS — E669 Obesity, unspecified: Secondary | ICD-10-CM | POA: Diagnosis not present

## 2020-06-27 DIAGNOSIS — Z20822 Contact with and (suspected) exposure to covid-19: Secondary | ICD-10-CM | POA: Insufficient documentation

## 2020-06-27 DIAGNOSIS — D649 Anemia, unspecified: Secondary | ICD-10-CM | POA: Insufficient documentation

## 2020-06-27 DIAGNOSIS — E785 Hyperlipidemia, unspecified: Secondary | ICD-10-CM | POA: Diagnosis not present

## 2020-06-27 DIAGNOSIS — I350 Nonrheumatic aortic (valve) stenosis: Secondary | ICD-10-CM | POA: Insufficient documentation

## 2020-06-27 DIAGNOSIS — Z79899 Other long term (current) drug therapy: Secondary | ICD-10-CM | POA: Insufficient documentation

## 2020-06-27 DIAGNOSIS — Z794 Long term (current) use of insulin: Secondary | ICD-10-CM | POA: Diagnosis not present

## 2020-06-27 DIAGNOSIS — E119 Type 2 diabetes mellitus without complications: Secondary | ICD-10-CM | POA: Insufficient documentation

## 2020-06-27 DIAGNOSIS — I517 Cardiomegaly: Secondary | ICD-10-CM | POA: Diagnosis not present

## 2020-06-27 LAB — URINALYSIS, ROUTINE W REFLEX MICROSCOPIC
Bacteria, UA: NONE SEEN
Bilirubin Urine: NEGATIVE
Glucose, UA: NEGATIVE mg/dL
Hgb urine dipstick: NEGATIVE
Ketones, ur: NEGATIVE mg/dL
Leukocytes,Ua: NEGATIVE
Nitrite: NEGATIVE
Protein, ur: NEGATIVE mg/dL
Specific Gravity, Urine: 1.01 (ref 1.005–1.030)
pH: 5 (ref 5.0–8.0)

## 2020-06-27 LAB — COMPREHENSIVE METABOLIC PANEL
ALT: 15 U/L (ref 0–44)
AST: 20 U/L (ref 15–41)
Albumin: 3.7 g/dL (ref 3.5–5.0)
Alkaline Phosphatase: 153 U/L — ABNORMAL HIGH (ref 38–126)
Anion gap: 14 (ref 5–15)
BUN: 14 mg/dL (ref 8–23)
CO2: 23 mmol/L (ref 22–32)
Calcium: 10.3 mg/dL (ref 8.9–10.3)
Chloride: 98 mmol/L (ref 98–111)
Creatinine, Ser: 1.17 mg/dL — ABNORMAL HIGH (ref 0.44–1.00)
GFR, Estimated: 47 mL/min — ABNORMAL LOW (ref >60)
Glucose, Bld: 200 mg/dL — ABNORMAL HIGH (ref 70–99)
Potassium: 4.5 mmol/L (ref 3.5–5.1)
Sodium: 135 mmol/L (ref 135–145)
Total Bilirubin: 0.6 mg/dL (ref 0.3–1.2)
Total Protein: 7 g/dL (ref 6.5–8.1)

## 2020-06-27 LAB — TYPE AND SCREEN
ABO/RH(D): O POS
Antibody Screen: NEGATIVE

## 2020-06-27 LAB — PROTIME-INR
INR: 0.9 (ref 0.8–1.2)
Prothrombin Time: 11.9 seconds (ref 11.4–15.2)

## 2020-06-27 LAB — BLOOD GAS, ARTERIAL
Acid-Base Excess: 2 mmol/L (ref 0.0–2.0)
Bicarbonate: 26.2 mmol/L (ref 20.0–28.0)
FIO2: 21
O2 Saturation: 98.8 %
Patient temperature: 37
pCO2 arterial: 41.7 mmHg (ref 32.0–48.0)
pH, Arterial: 7.414 (ref 7.350–7.450)
pO2, Arterial: 137 mmHg — ABNORMAL HIGH (ref 83.0–108.0)

## 2020-06-27 LAB — HEMOGLOBIN A1C
Hgb A1c MFr Bld: 9.4 % — ABNORMAL HIGH (ref 4.8–5.6)
Mean Plasma Glucose: 223 mg/dL

## 2020-06-27 LAB — SARS CORONAVIRUS 2 (TAT 6-24 HRS): SARS Coronavirus 2: NEGATIVE

## 2020-06-27 LAB — CBC
HCT: 37.3 % (ref 36.0–46.0)
Hemoglobin: 12 g/dL (ref 12.0–15.0)
MCH: 27.4 pg (ref 26.0–34.0)
MCHC: 32.2 g/dL (ref 30.0–36.0)
MCV: 85.2 fL (ref 80.0–100.0)
Platelets: 266 10*3/uL (ref 150–400)
RBC: 4.38 MIL/uL (ref 3.87–5.11)
RDW: 13.6 % (ref 11.5–15.5)
WBC: 11.1 10*3/uL — ABNORMAL HIGH (ref 4.0–10.5)
nRBC: 0 % (ref 0.0–0.2)

## 2020-06-27 LAB — SURGICAL PCR SCREEN
MRSA, PCR: NEGATIVE
Staphylococcus aureus: NEGATIVE

## 2020-06-27 LAB — GLUCOSE, CAPILLARY: Glucose-Capillary: 218 mg/dL — ABNORMAL HIGH (ref 70–99)

## 2020-06-27 NOTE — Progress Notes (Signed)
PCP - Pt states she does not remember her PCP's name. A provider at [facility]. Dr. Yong Channel on file is no longer servicing her. Cardiologist - Dr. Domenic Polite  Chest x-ray - 06/27/20 EKG - 06/27/20 Stress Test - 01/05/20 ECHO - 04/09/20 Cardiac Cath - 04/10/20  Sleep Study - denies CPAP - denies  CBG at PAT 218 Only checks CBG 1 time a month even though she is supposed to check it before every meal. Last A1C 01/2019 was 7.3. Will collect A1C today.  Blood Thinner Instructions: N/A Aspirin Instructions:N/A  COVID TEST- 06/27/20 in PAT.  Medication list unreconciled. Pt was supposed to bring meds in to PAT appointment but her daughter told her that she didn't need them and they were left in the car. Contacted daughter to get meds but they had been dropped off to the hospital and ride was not on site. Advised daughter to call the pharmacy call center to complete medication reconciliation once she had the meds in front of her. Daughter verbally agreed.    Anesthesia review: Yes  Patient denies shortness of breath, fever, cough and chest pain at PAT appointment   All instructions explained to the patient, with a verbal understanding of the material. Patient agrees to go over the instructions while at home for a better understanding. Patient also instructed to self quarantine after being tested for COVID-19. The opportunity to ask questions was provided.

## 2020-06-27 NOTE — Therapy (Signed)
[place], [place], [address] Phone: [phone]   Fax:  [phone]  Physical Therapy Pre TAVR Evaluation  Patient Details  Name: Stephanie [name] MRN: [id number] Date of Birth: 10/12/1935 Referring Provider (PT): Burnell Blanks, MD   Encounter Date: 06/27/2020   PT End of Session - 06/27/20 0816     Visit Number 1    Number of Visits 1    Date for PT Re-Evaluation 06/27/20    Authorization Type U[employer] MCR    PT Start Time 0820    PT Stop Time 0850    PT Time Calculation (min) 30 min    Equipment Utilized During Treatment Gait belt    Activity Tolerance Patient tolerated treatment well    Behavior During Therapy [facility] for tasks assessed/performed             Past Medical History:  Diagnosis Date   Aortic stenosis    Arthritis    Asthma    CKD (chronic kidney disease)    Essential hypertension    Mixed hyperlipidemia    Symptomatic anemia    Presumed GI bleed January 2021 - she declined GI work-up   Type 2 diabetes mellitus ([place])     Past Surgical History:  Procedure Laterality Date   APPENDECTOMY     BACK SURGERY     CYST REMOVAL HAND     Right, hospital in [place] MENISECTOMY Right 02/28/2012   Procedure: KNEE ARTHROSCOPY WITH MEDIAL MENISECTOMY;  Surgeon: Carole Civil, MD;  Location: [facility];  Service: Orthopedics;  Laterality: Right;   REPAIR KNEE LIGAMENT     RIGHT/LEFT HEART CATH AND CORONARY ANGIOGRAPHY N/A 04/10/2020   Procedure: RIGHT/LEFT HEART CATH AND CORONARY ANGIOGRAPHY;  Surgeon: Jettie Booze, MD;  Location: [place] CV [facility];  Service: Cardiovascular;  Laterality: N/A;    There were no vitals filed for this visit.    Subjective Assessment - 06/27/20 0819     Subjective Patient reports shortness of breath that has been worsening over past few months. She has trouble breathing with household tasks. Patient also reports  balance deficits.    Pertinent History History of hypertension, hyperlipidemia, type 2 diabetes, COPD, anemia presumed to be from GI bleed, chronic kidney disease, and aortic stenosis    Limitations Walking    How long can you sit comfortably? No limitation    How long can you walk comfortably? less than 5 minutes    Patient Stated Goals Get heart better    Currently in Pain? No/denies                [facility] PT Assessment - 06/27/20 0001       Assessment   Medical Diagnosis Severe Aortic Stenosis    Referring Provider (PT) Burnell Blanks, MD    Onset Date/Surgical Date --   worsening over past 4-6 months   Hand Dominance Right    Next MD Visit 07/01/20    Prior Therapy None      Precautions   Precautions None      Restrictions   Weight Bearing Restrictions No      Balance Screen   Has the patient fallen in the past 6 months No    Has the patient had a decrease in activity level because of a fear of falling?  No    Is the patient reluctant to leave their home because of a fear of  falling?  No      Home Environment   Living Environment Private residence    Living Arrangements Other relatives   granddaughter   Type of [place] to enter    Entrance Stairs-Number of Steps 3    [place] reach both    [place] One level      Prior Function   Level of Independence Independent with basic ADLs    Vocation Retired    Leisure None reported      Cognition   Overall Cognitive Status Within Functional Limits for tasks assessed      Observation/Other Assessments   Observations Patient appears in no apparent distress    Focus on Therapeutic Outcomes (FOTO)  NA      Sensation   Light Touch Appears Intact      Coordination   Gross Motor Movements are Fluid and Coordinated Yes      ROM / Strength   AROM / PROM / Strength AROM;Strength      AROM   Overall AROM Comments Patient exhibits BUE and BLE AROM grossly  WFL      Strength   Overall Strength Comments Patient exhibits BUE and BLE strength grossly 5/5 MMT    Strength Assessment Site Hand    Right/Left hand Right;Left    Right Hand Grip (lbs) 58,60,56    Left Hand Grip (lbs) 16,10,96      Transfers   Transfers Independent with all Transfers      Ambulation/Gait   Ambulation/Gait Yes    Ambulation/Gait Assistance 6: Modified independent (Device/Increase time)    Assistive device Straight cane    Gait Comments Trendelenburg on right              [facility] Pre-Surgical Assessment - 06/27/20 0001     5 Meter Walk Test- trial 1 4 sec    5 Meter Walk Test- trial 2 5 sec.     5 Meter Walk Test- trial 3 5 sec.    5 meter walk test average 4.67 sec    4 Stage Balance Test tolerated for:  2 sec.    4 Stage Balance Test Position 3    Sit To Stand Test- trial 1 13 sec.    ADL/IADL Independent with: Bathing;Dressing;Meal prep;Finances    ADL/IADL Needs Assistance with: [employer] Leaver work    ADL/IADL Fraility Index Vulnerable    Other comment 9/12 fraility index    6 Minute Walk- Baseline yes    BP (mmHg) 131/91    HR (bpm) 85    02 Sat (%RA) 98 %    Modified Borg Scale for Dyspnea 0- Nothing at all    Perceived Rate of Exertion (Borg) 6-    6 Minute Walk Post Test yes    BP (mmHg) 162/97    HR (bpm) 95    02 Sat (%RA) 98 %    Modified Borg Scale for Dyspnea 7- Severe shortness of breath or very hard breathing    Perceived Rate of Exertion (Borg) 12-    Aerobic Endurance Distance Walked 370    Endurance additional comments patient able to ambulate 2:45 sec then required rest break the rest of the time                      Objective measurements completed on examination: See above findings.               PT  Education - 06/27/20 0815     Education Details Exam findings    Person(s) Educated Patient    Methods Explanation    Comprehension Verbalized understanding                         Plan -  06/27/20 0816     Clinical Impression Statement See below    Personal Factors and Comorbidities Age;Fitness;Time since onset of injury/illness/exacerbation;Past/Current Experience;Comorbidity 3+    Comorbidities History of hypertension, hyperlipidemia, type 2 diabetes, COPD, anemia presumed to be from GI bleed, chronic kidney disease, and aortic stenosis    Examination-Activity Limitations Locomotion Level    Examination-Participation Restrictions Community Activity    Stability/Clinical Decision Making Stable/Uncomplicated    Clinical Decision Making Low    Rehab Potential Good    PT Frequency One time visit    PT Duration Other (comment)   one time visit   PT Treatment/Interventions ADLs/Self Care Home Management;Neuromuscular re-education;Balance training;Therapeutic exercise;Therapeutic activities;Functional mobility training;Gait training;Stair training;Energy conservation    PT Next Visit Plan NA    PT Home Exercise Plan NA    Consulted and Agree with Plan of Care Patient             Clinical Impression Statement: Pt is a 85 yo female presenting to OP PT for evaluation prior to possible TAVR surgery due to severe aortic stenosis. Pt reports onset of shortness of breath approximately worsening 4-6 months ago. Symptoms are limiting walking and mobility. Pt presents with good ROM and strength, moderately impaired balance and is at high fall risk 4 stage balance test, good walking speed and poor aerobic endurance per 6 minute walk test. Pt ambulated 370 feet in 2:45 min before requesting a seated rest beak lasting 3:15 min. At time of rest, patient's HR was 95 bpm and O2 was 96 on room air. Pt reported 7/10 shortness of breath on modified scale for dyspnea. Pt ambulated a total of 370 feet in 6 minute walk. Shortness of breath increased significantly with 6 minute walk test. Based on the Short Physical Performance Battery, patient has a frailty rating of 9/12 with </= 5/12 considered frail.    Patient demonstrates the following deficits and impairments:  Difficulty walking, Cardiopulmonary status limiting activity, Decreased activity tolerance, Decreased balance, Decreased strength  Visit Diagnosis: Other abnormalities of gait and mobility     Problem List Patient Active Problem List   Diagnosis Date Noted   Dyspnea 04/08/2020   Asthma 04/08/2020   DM (diabetes mellitus) ([place]) 04/08/2020   Aortic stenosis 04/08/2020   Symptomatic anemia 02/09/2019   Vitamin B12 deficiency    Sepsis ([place]) 11/15/2017   AKI (acute kidney injury) ([place]) 11/15/2017   Acute lower UTI 11/15/2017   Uncontrolled type 2 diabetes mellitus with hyperglycemia ([place]) 07/06/2017   Essential hypertension, benign 07/06/2017   Mixed hyperlipidemia 07/06/2017   Class 2 severe obesity due to excess calories with serious comorbidity and body mass index (BMI) of 36.0 to 36.9 in adult ([place]) 07/06/2017   Medial meniscus, posterior horn derangement 04/12/2012   OA (osteoarthritis) of knee 04/12/2012    Hilda Blades, PT, DPT, LAT, ATC 06/27/20  8:53 AM Phone: [phone] Fax: [facility] [address] [place], [place], [address] Phone: [phone]   Fax:  [phone]  Name: EMILYA JUSTEN MRN: [id number] Date of Birth: [birth year]

## 2020-06-30 MED ORDER — VANCOMYCIN HCL 1500 MG/300ML IV SOLN
1500.0000 mg | INTRAVENOUS | Status: AC
  Administered 2020-07-01: 1500 mg via INTRAVENOUS
  Filled 2020-06-30: qty 300

## 2020-06-30 MED ORDER — SODIUM CHLORIDE 0.9 % IV SOLN
INTRAVENOUS | Status: DC
  Filled 2020-06-30: qty 30

## 2020-06-30 MED ORDER — MAGNESIUM SULFATE 50 % IJ SOLN
40.0000 meq | INTRAMUSCULAR | Status: DC
  Filled 2020-06-30: qty 9.85

## 2020-06-30 MED ORDER — DEXMEDETOMIDINE HCL IN NACL 400 MCG/100ML IV SOLN
0.1000 ug/kg/h | INTRAVENOUS | Status: AC
  Administered 2020-07-01: 91.4 ug via INTRAVENOUS
  Administered 2020-07-01: 1 ug/kg/h via INTRAVENOUS
  Filled 2020-06-30: qty 100

## 2020-06-30 MED ORDER — NOREPINEPHRINE 4 MG/250ML-% IV SOLN
0.0000 ug/min | INTRAVENOUS | Status: DC
  Filled 2020-06-30: qty 250

## 2020-06-30 MED ORDER — POTASSIUM CHLORIDE 2 MEQ/ML IV SOLN
80.0000 meq | INTRAVENOUS | Status: DC
  Filled 2020-06-30: qty 40

## 2020-06-30 NOTE — Anesthesia Preprocedure Evaluation (Addendum)
Anesthesia Evaluation  Patient identified by MRN, date of birth, ID band Patient awake    Reviewed: Allergy & Precautions, NPO status , Patient's Chart, lab work & pertinent test results  History of Anesthesia Complications Negative for: history of anesthetic complications  Airway Mallampati: I  TM Distance: >3 FB Neck ROM: Full    Dental  (+) Missing, Poor Dentition, Dental Advisory Given   Pulmonary asthma , former smoker,    Pulmonary exam normal        Cardiovascular hypertension, Pt. on medications + Valvular Problems/Murmurs AS  Rhythm:Regular Rate:Normal + Systolic murmurs Narrative  Mid Cx lesion is 50% stenosed.  LV end diastolic pressure is mildly elevated.  There is mild aortic valve stenosis. Calculated valve area 1.56 cm2.  Ao sat 98%, PA sat 73%, PA pressure 39/5, mean PA pressure 19 mm Hg;  mean PCWP 14 mm Hg; CO 6.5 L/min; CI 3.3.  Small radial artery. Ulnar access obtained. Vasospasm noted.  Nonobstructive CAD. Mild aortic stenosis.   Will hydrate gently post cath given renal insufficiency. Ultimately, may  need diuresis.   IMPRESSIONS    1. Left ventricular ejection fraction, by estimation, is 55 to 60%. The  left ventricle has normal function. Left ventricular diastolic parameters  are consistent with Grade II diastolic dysfunction (pseudonormalization).  Elevated left atrial pressure.  2. The pericardial effusion is circumferential.  3. Moderate mitral annular calcification.  4. Moderate aortic stenosis by gradient, severe by AVA VTI. Borderline  dimensionless index for severe at 0.26. Lower stroke volume index likely  contributing to lower than expected gradient. Essentially moderate to  severe AS more toward the severe side  of the spectrum.. The aortic valve is tricuspid. There is moderate  calcification of the aortic valve. There is moderate thickening of the  aortic valve.     Neuro/Psych negative neurological ROS     GI/Hepatic negative GI ROS, Neg liver ROS,   Endo/Other  diabetes  Renal/GU Renal InsufficiencyRenal disease     Musculoskeletal negative musculoskeletal ROS (+)   Abdominal   Peds  Hematology negative hematology ROS (+) anemia ,   Anesthesia Other Findings   Reproductive/Obstetrics                           Anesthesia Physical Anesthesia Plan  ASA: 3  Anesthesia Plan: MAC   Post-op Pain Management:    Induction:   PONV Risk Score and Plan: 2 and Ondansetron and Propofol infusion  Airway Management Planned: Natural Airway, Simple Face Mask and Nasal Cannula  Additional Equipment: Arterial line  Intra-op Plan:   Post-operative Plan:   Informed Consent: I have reviewed the patients History and Physical, chart, labs and discussed the procedure including the risks, benefits and alternatives for the proposed anesthesia with the patient or authorized representative who has indicated his/her understanding and acceptance.     Dental advisory given  Plan Discussed with: Anesthesiologist and CRNA  Anesthesia Plan Comments: (PAT note written 06/30/2020 by Myra Gianotti, PA-C. )       Anesthesia Quick Evaluation

## 2020-06-30 NOTE — H&P (Signed)
[address]       [name] [address]             [phone]      Cardiothoracic Surgery Admission History and Physical   Referring Provider is Satira Sark, MD Primary Cardiologist is Rozann Lesches, MD PCP is Abran Richard, MD       Chief Complaint  Patient presents with   Aortic Stenosis            HPI:   The patient is an 85 year old woman with a history of hypertension, hyperlipidemia, type 2 diabetes, COPD, anemia presumed to be from GI bleed, chronic kidney disease, and aortic stenosis who presented to [facility] on 04/08/2020 for evaluation of worsening shortness of breath with exertion, orthopnea, and PND.  Chest x-ray showed congestive heart failure and her BNP was elevated at 649.  She was felt to have flash pulmonary edema and was started on intravenous Lasix.  2D echocardiogram showed a trileaflet aortic valve with moderate calcification and thickening of the leaflets.  The mean gradient was measured at 25 mmHg with a peak gradient of 44 mmHg.  Aortic valve area by VTI was 0.81 cm with a dimensionless index of 0.26 and a stroke-volume index of 32.  Left ventricular ejection fraction was 55 to 60% with grade 2 diastolic dysfunction.  She was transferred to [facility] and underwent cardiac catheterization showing nonobstructive coronary disease with 50% left circumflex stenosis.  The aortic valve mean gradient was measured at 30 mmHg with a peak gradient of 26 mmHg.  Aortic valve area was measured at 1.56 cm.  Cardiac index was 3.3.  LVEDP was 18.  Right heart pressures were within normal limits.  She was discharged home on Lasix 20 mg daily.  She reports that she has been feeling fairly well since going home.  She still has some shortness of breath with moderate exertion and some lower extremity edema.  She denies any chest pain or pressure.  She denies orthopnea.  She has had no dizziness or syncope.       Past Medical History:   Diagnosis Date   Aortic stenosis     Arthritis     Asthma     CKD (chronic kidney disease)     Essential hypertension     Mixed hyperlipidemia     Symptomatic anemia      Presumed GI bleed January 2021 - she declined GI work-up   Type 2 diabetes mellitus ([place])             Past Surgical History:  Procedure Laterality Date   APPENDECTOMY       BACK SURGERY       CYST REMOVAL HAND        Right, hospital in [place] WITH MEDIAL MENISECTOMY Right 02/28/2012    Procedure: KNEE ARTHROSCOPY WITH MEDIAL MENISECTOMY;  Surgeon: Carole Civil, MD;  Location: [facility];  Service: Orthopedics;  Laterality: Right;   REPAIR KNEE LIGAMENT       RIGHT/LEFT HEART CATH AND CORONARY ANGIOGRAPHY N/A 04/10/2020    Procedure: RIGHT/LEFT HEART CATH AND CORONARY ANGIOGRAPHY;  Surgeon: Jettie Booze, MD;  Location: [facility];  Service: Cardiovascular;  Laterality: N/A;           Family History  Problem Relation Age of Onset   Lung disease Other     Cancer Other     Arthritis Other  Asthma Other     Diabetes Other     Diabetes Mother     Hypertension Mother     Diverticulitis Mother     Diabetes Father     Hypertension Sister     Diabetes Brother     Colon cancer Neg Hx        Social History         Socioeconomic History   Marital status: Divorced      Spouse name: Not on file   Number of children: 3   Years of education: Not on file   Highest education level: Not on file  Occupational History   Occupation: Retired-cook  Tobacco Use   Smoking status: Former Smoker      Years: 7.00   Smokeless tobacco: Never Used  Scientific laboratory technician Use: Never used  Substance and Sexual Activity   Alcohol use: No   Drug use: No   Sexual activity: Never  Other Topics Concern   Not on file  Social History Narrative    ** Merged History Encounter **         Social Determinants of Health    Financial Resource Strain: Not on file  Food Insecurity: Not on file   Transportation Needs: Not on file  Physical Activity: Not on file  Stress: Not on file  Social Connections: Not on file  Intimate Partner Violence: Not on file            Current Outpatient Medications  Medication Sig Dispense Refill   albuterol (PROVENTIL) (2.5 MG/3ML) 0.083% nebulizer solution Take 2.5 mg by nebulization every 6 (six) hours as needed for wheezing or shortness of breath.   2   amLODipine (NORVASC) 5 MG tablet Take 1 tablet (5 mg total) by mouth daily. 90 tablet 3   atorvastatin (LIPITOR) 20 MG tablet Take 20 mg by mouth every evening.       B-D UF III MINI PEN NEEDLES 31G X 5 MM MISC SMARTSIG:1 Each SUB-Q Daily       furosemide (LASIX) 20 MG tablet Take 1 tablet (20 mg total) by mouth daily. 90 tablet 3   gabapentin (NEURONTIN) 100 MG capsule Take 1-2 capsules by mouth at bedtime.       hydrALAZINE (APRESOLINE) 25 MG tablet Take 1 tablet (25 mg total) by mouth 3 (three) times daily. 270 tablet 3   Insulin Detemir (LEVEMIR FLEXTOUCH) 100 UNIT/ML Pen Inject 30 Units into the skin at bedtime. (Patient taking differently: Inject 32 Units into the skin daily.) 15 mL 11   metFORMIN (GLUCOPHAGE) 500 MG tablet Take 1 tablet (500 mg total) by mouth 2 (two) times daily. Resume on 04/13/20.       metoprolol tartrate (LOPRESSOR) 50 MG tablet Take 1 tablet (50 mg total) by mouth once for 1 dose. Please take 2 hr prior to CT scan 1 tablet 0   ULTICARE MINI PEN NEEDLES 31G X 6 MM MISC          No current facility-administered medications for this visit.           Allergies  Allergen Reactions   Amoxicillin Swelling      Tongue swelling     Macrobid [Nitrofurantoin] Nausea And Vomiting   Penicillins Swelling      Has patient had a PCN reaction causing immediate rash, facial/tongue/throat swelling, SOB or lightheadedness with hypotension: No Has patient had a PCN reaction causing severe rash involving mucus membranes or skin necrosis: No  Has patient had a PCN reaction that  required hospitalization: No Has patient had a PCN reaction occurring within the last 10 years: No If all of the above answers are "NO", then may proceed with Cephalosporin use.     Sulfa Antibiotics Swelling      Lip swelling          Review of Systems:               General:                      normal appetite, + decreased energy, no weight gain, no weight loss, no fever             Cardiac:                       no chest pain with exertion, no chest pain at rest, +SOB with moderate exertion, no resting SOB, no PND, no orthopnea, no palpitations, no arrhythmia, no atrial fibrillation, + LE edema, no dizzy spells, no syncope             Respiratory:                 + exertional shortness of breath, no home oxygen, no productive cough, no dry cough, + bronchitis, no wheezing, no hemoptysis, + asthma, no pain with inspiration or cough, no sleep apnea, no CPAP at night             GI:                               no difficulty swallowing, no reflux, no frequent heartburn, no hiatal hernia, no abdominal pain, no constipation, no diarrhea, no hematochezia, no hematemesis, no melena             GU:                              no dysuria,  no frequency, no urinary tract infection, no hematuria, no kidney stones, no kidney disease             Vascular:                     no pain suggestive of claudication, no pain in feet, no leg cramps, no varicose veins, no DVT, no non-healing foot ulcer             Neuro:                         no stroke, no TIA's, no seizures, no headaches, no temporary blindness one eye,  no slurred speech, no peripheral neuropathy, no chronic pain, no instability of gait, no memory/cognitive dysfunction             Musculoskeletal:         + arthritis, no joint swelling, no myalgias, no difficulty walking, normal mobility             Skin:                            no rash, no itching, no skin infections, no pressure sores or ulcerations             Psych:  no anxiety, no depression, no nervousness, no unusual recent stress             Eyes:                           no blurry vision, no floaters, no recent vision changes, + wears glasses             ENT:                            no hearing loss, no loose or painful teeth, no dentures, last saw dentist this year             Hematologic:               no easy bruising, no abnormal bleeding, no clotting disorder, no frequent epistaxis             Endocrine:                   + diabetes, does check CBG's at home                                                         Physical Exam:               BP (!) 175/90   Pulse (!) 45   Resp 20   Ht 5\' 5"  (1.651 m)   Wt 200 lb (90.7 kg)   SpO2 93% Comment: RA  BMI 33.28 kg/m              General:                      Elderly but  well-appearing             HEENT:                       Unremarkable, NCAT, PERLA, EOMI             Neck:                           no JVD, no bruits, no adenopathy             Chest:                          clear to auscultation, symmetrical breath sounds, no wheezes, no rhonchi             CV:                              RRR, grade lll/VI crescendo/decrescendo murmur heard best at RSB,  no diastolic murmur             Abdomen:                    soft, non-tender, no masses             Extremities:                 warm, well-perfused, pulses palpable at ankle, mild LE  edema in ankle             Rectal/GU                   Deferred             Neuro:                         Grossly non-focal and symmetrical throughout             Skin:                            Clean and dry, no rashes, no breakdown     Diagnostic Tests:   ECHOCARDIOGRAM LIMITED REPORT         Patient Name:   Stephanie [name] Date of Exam: 04/09/2020  Medical Rec #:  [id number]         Height:       65.0 in  Accession #:    [id number]        Weight:       198.2 lb  Date of Birth:  25-Nov-1935         BSA:          1.971 m  Patient Age:     60 years          BP:           143/70 mmHg  Patient Gender: F                 HR:           69 bpm.  Exam Location:  [facility]   Procedure: Limited Echo   Indications:    Aortic stenosis I35.0     History:        Patient has prior history of Echocardiogram examinations,  most                  recent 02/22/2020. Signs/Symptoms:Dyspnea; Risk                  Factors:Non-Smoker, Diabetes, Dyslipidemia and  Hypertension.                  Sepsis.     Sonographer:    Leavy Cella RDCS (AE)  Referring Phys: [id number] [place]     1. Left ventricular ejection fraction, by estimation, is 55 to 60%. The  left ventricle has normal function. Left ventricular diastolic parameters  are consistent with Grade II diastolic dysfunction (pseudonormalization).  Elevated left atrial pressure.   2. The pericardial effusion is circumferential.   3. Moderate mitral annular calcification.   4. Moderate aortic stenosis by gradient, severe by AVA VTI. Borderline  dimensionless index for severe at 0.26. Lower stroke volume index likely  contributing to lower than expected gradient. Essentially moderate to  severe AS more toward the severe side  of the spectrum.. The aortic valve is tricuspid. There is moderate  calcification of the aortic valve. There is moderate thickening of the  aortic valve.   FINDINGS   Left Ventricle: Left ventricular ejection fraction, by estimation, is 55  to 60%. The left ventricle has normal function. Left ventricular diastolic  parameters are consistent with Grade II diastolic dysfunction  (pseudonormalization). Elevated left atrial   pressure.   Pericardium: Trivial pericardial effusion is present. The pericardial  effusion is circumferential.  Mitral Valve: There is mild thickening of the mitral valve leaflet(s).  There is mild calcification of the mitral valve leaflet(s). Moderate  mitral annular calcification. The mean mitral valve gradient  is 3.3 mmHg  with average heart rate of 65 bpm.   Aortic Valve: Moderate aortic stenosis by gradient, severe by AVA VTI.  Borderline dimensionless index for severe at 0.26. Lower stroke volume  index likely contributing to lower than expected gradient. Essentially  moderate to severe AS more toward the  severe side of the spectrum. The aortic valve is tricuspid. There is  moderate calcification of the aortic valve. There is moderate thickening  of the aortic valve. There is moderate aortic valve annular calcification.  Aortic valve mean gradient measures  25.1 mmHg. Aortic valve peak gradient measures 43.8 mmHg. Aortic valve  area, by VTI measures 0.81 cm.   Pulmonic Valve: The pulmonic valve was not well visualized. Pulmonic valve  regurgitation is not visualized. No evidence of pulmonic stenosis.   LEFT VENTRICLE  PLAX 2D  LVIDd:         4.73 cm  Diastology  LVIDs:         3.48 cm  LV e' medial:    4.06 cm/s  LV PW:         1.22 cm  LV E/e' medial:  36.0  LV IVS:        1.51 cm  LV e' lateral:   2.94 cm/s  LVOT diam:     2.00 cm  LV E/e' lateral: 49.7  LV SV:         63  LV SV Index:   32  LVOT Area:     3.14 cm      LEFT ATRIUM           Index  LA diam:      4.20 cm 2.13 cm/m  LA Vol (A4C): 62.5 ml 31.72 ml/m   AORTIC VALVE  AV Area (Vmax):    0.74 cm  AV Area (Vmean):   0.61 cm  AV Area (VTI):     0.81 cm  AV Vmax:           330.94 cm/s  AV Vmean:          234.927 cm/s  AV VTI:            0.787 m  AV Peak Grad:      43.8 mmHg  AV Mean Grad:      25.1 mmHg  LVOT Vmax:         78.04 cm/s  LVOT Vmean:        45.914 cm/s  LVOT VTI:          0.202 m  LVOT/AV VTI ratio: 0.26     AORTA  Ao Root diam: 2.90 cm   MITRAL VALVE  MV Area (PHT): 2.07 cm     SHUNTS  MV Mean grad:  3.3 mmHg     Systemic VTI:  0.20 m  MV Decel Time: 367 msec     Systemic Diam: 2.00 cm  MV E velocity: 146.00 cm/s  MV A velocity: 86.90 cm/s  MV E/A ratio:  1.68   Carlyle Dolly MD   Electronically signed by Carlyle Dolly MD  Signature Date/Time: 04/09/2020/12:58:04 PM    Physicians   Panel Physicians Referring Physician Case Authorizing Physician  Jettie Booze, MD (Primary)          Procedures   RIGHT/LEFT HEART CATH AND CORONARY  ANGIOGRAPHY    Conclusion     Mid Cx lesion is 50% stenosed. LV end diastolic pressure is mildly elevated. There is mild aortic valve stenosis. Calculated valve area 1.56 cm2. Ao sat 98%, PA sat 73%, PA pressure 39/5, mean PA pressure 19 mm Hg; mean PCWP 14 mm Hg; CO 6.5 L/min; CI 3.3. Small radial artery. Ulnar access obtained. Vasospasm noted.   Nonobstructive CAD.  Mild aortic stenosis.     Will hydrate gently post cath given renal insufficiency.  Ultimately, may need diuresis.         Indications   Nonrheumatic aortic valve stenosis [I35.0 (ICD-10-CM)]    Procedural Details   Technical Details The risks, benefits, and details of the procedure were explained to the patient.  The patient verbalized understanding and wanted to proceed.  Informed written consent was obtained.  PROCEDURE TECHNIQUE:  After Xylocaine anesthesia, a 7 French sheath was placed in the right antecubital area in exchange for a peripheral IV. A 7 French balloontipped Swan-Ganz catheter was advanced to the pulmonary artery under fluoroscopic guidance. Hemodynamic pressures were obtained. Oxygen saturations were obtained. Radial artery was small by ultrasound.  After Xylocaine anesthesia, a 69F sheath was placed in the right ulnar artery with an anterior needle wall stick using ultrasound guidance.   Left heart cath was done using a JR3.5 catheter. Right coronary angiography was done using a Judkins R4 guide catheter.  Left coronary angiography was done using a Judkins L4 guide catheter.  TR band for hemostasis.  Aortic valve was crossed with a J wire.    Of note, stress test was negative in 12/21 so FFR of circumflex was not done.  Contrast dye  usage was minimized.  Contrast: 50 cc  Estimated blood loss <50 mL.   During this procedure medications were administered to achieve and maintain moderate conscious sedation while the patient's heart rate, blood pressure, and oxygen saturation were continuously monitored and I was present face-to-face 100% of this time.    Medications (Filter: Administrations occurring from 1358 to 1531 on 04/10/20)  (important)  Continuous medications are totaled by the amount administered until 04/10/20 1531.      fentaNYL (SUBLIMAZE) injection (mcg) Total dose:  50 mcg   Date/Time Rate/Dose/Volume Action    04/10/20 1431 25 mcg Given    1442 25 mcg Given      midazolam (VERSED) injection (mg) Total dose:  2 mg   Date/Time Rate/Dose/Volume Action    04/10/20 1431 1 mg Given    1443 1 mg Given      lidocaine (PF) (XYLOCAINE) 1 % injection (mL) Total volume:  4 mL   Date/Time Rate/Dose/Volume Action    04/10/20 1439 2 mL Given    1441 2 mL Given      heparin sodium (porcine) injection (Units) Total dose:  4,500 Units   Date/Time Rate/Dose/Volume Action    04/10/20 1458 4,500 Units Given      Radial Cocktail/Verapamil only (mL) Total volume:  10 mL   Date/Time Rate/Dose/Volume Action    04/10/20 1448 10 mL Given      iohexol (OMNIPAQUE) 350 MG/ML injection (mL) Total volume:  50 mL   Date/Time Rate/Dose/Volume Action    04/10/20 1528 50 mL Given      albuterol (PROVENTIL) (2.5 MG/3ML) 0.083% nebulizer solution 2.5 mg (mg) Total dose:  Cannot be calculated* Dosing weight:  91   *Administration dose not documented Date/Time Rate/Dose/Volume Action    04/10/20 1358 *Not included in total  MAR Hold      amLODipine (NORVASC) tablet 5 mg (mg) Total dose:  Cannot be calculated*   *Administration dose not documented Date/Time Rate/Dose/Volume Action    04/10/20 1358 *Not included in total MAR Hold      atorvastatin (LIPITOR) tablet 20 mg (mg) Total dose:  Cannot be calculated*  Dosing weight:  91   *Administration dose not documented Date/Time Rate/Dose/Volume Action    04/10/20 1358 *Not included in total MAR Hold      enoxaparin (LOVENOX) injection 40 mg (mg) Total dose:  Cannot be calculated* Dosing weight:  91   *Administration dose not documented Date/Time Rate/Dose/Volume Action    04/10/20 1358 *Not included in total MAR Hold      gabapentin (NEURONTIN) capsule 100-200 mg (mg) Total dose:  Cannot be calculated*   *Administration dose not documented Date/Time Rate/Dose/Volume Action    04/10/20 1358 *Not included in total MAR Hold      hydrALAZINE (APRESOLINE) tablet 25 mg (mg) Total dose:  Cannot be calculated* Dosing weight:  89.9   *Administration dose not documented Date/Time Rate/Dose/Volume Action    04/10/20 1358 *Not included in total MAR Hold    1400 *Not included in total Automatically Held      insulin aspart (novoLOG) injection 0-15 Units (Units) Total dose:  Cannot be calculated* Dosing weight:  91   *Administration dose not documented Date/Time Rate/Dose/Volume Action    04/10/20 1358 *Not included in total MAR Hold      insulin aspart (novoLOG) injection 0-5 Units (Units) Total dose:  Cannot be calculated* Dosing weight:  91   *Administration dose not documented Date/Time Rate/Dose/Volume Action    04/10/20 1358 *Not included in total MAR Hold      insulin detemir (LEVEMIR) injection 32 Units (Units) Total dose:  Cannot be calculated* Dosing weight:  91   *Administration dose not documented Date/Time Rate/Dose/Volume Action    04/10/20 1358 *Not included in total MAR Hold      polyethylene glycol (MIRALAX / GLYCOLAX) packet 17 g (g) Total dose:  Cannot be calculated* Dosing weight:  91   *Administration dose not documented Date/Time Rate/Dose/Volume Action    04/10/20 1358 *Not included in total MAR Hold      predniSONE (DELTASONE) tablet 50 mg (mg) Total dose:  Cannot be calculated* Dosing weight:  91    *Administration dose not documented Date/Time Rate/Dose/Volume Action    04/10/20 1358 *Not included in total MAR Hold      sodium chloride flush (NS) 0.9 % injection 3 mL (mL) Total dose:  Cannot be calculated* Dosing weight:  89.9   *Administration dose not documented Date/Time Rate/Dose/Volume Action    04/10/20 1358 *Not included in total MAR Hold    1430 *Not included in total Automatically Held      Sedation Time   Sedation Time Physician-1: 40 minutes 3 seconds     Contrast   Medication Name Total Dose  iohexol (OMNIPAQUE) 350 MG/ML injection 50 mL      Radiation/Fluoro   Fluoro time: 5.3 (min) DAP: 16.6 (Gycm2) Cumulative Air Kerma: 426.8 (mGy)     Complications     Complications documented before study signed (04/10/2020  3:32 PM)      No complications were associated with this study.  Documented by Jettie Booze, MD - 04/10/2020  3:22 PM      Coronary Findings     Diagnostic Dominance: Right   Left Anterior Descending  The vessel exhibits minimal luminal irregularities.  Left Circumflex  The vessel exhibits minimal luminal irregularities.  Mid Cx lesion is 50% stenosed.  Right Coronary Artery  The vessel exhibits minimal luminal irregularities.    Intervention     No interventions have been documented.   Right Heart   Right Heart Pressures Ao sat 98%, PA sat 73%, PA pressure 39/5, mean PA pressure 19 mm Hg; mean PCWP 14 mm Hg; CO 6.5 L/min; CI 3.3.    Left Heart   Left Ventricle LV end diastolic pressure is mildly elevated.  Aortic Valve There is mild aortic valve stenosis.    Coronary Diagrams     Diagnostic Dominance: Right      Intervention       Implants         No implant documentation for this case.    Syngo Images    Show images for CARDIAC CATHETERIZATION   Images on Long Term Storage    Show images for Stephanie [name], Stephanie [name] to Procedure Log   Procedure Log      Hemo Data   Flowsheet Row  Most Recent Value  Fick Cardiac Output 6.54 L/min  Fick Cardiac Output Index 3.32 (L/min)/BSA  Aortic Mean Gradient 30.31 mmHg  Aortic Peak Gradient 26 mmHg  Aortic Valve Area 1.56  Aortic Value Area Index 0.79 cm2/BSA  RA A Wave 7 mmHg  RA V Wave 4 mmHg  RA Mean 4 mmHg  RV Systolic Pressure 40 mmHg  RV Diastolic Pressure 0 mmHg  RV EDP 3 mmHg  PA Systolic Pressure 39 mmHg  PA Diastolic Pressure 5 mmHg  PA Mean 19 mmHg  PW A Wave 17 mmHg  PW V Wave 18 mmHg  PW Mean 14 mmHg  LV Systolic Pressure 710 mmHg  LV Diastolic Pressure -3 mmHg  LV EDP 18 mmHg  AOp Systolic Pressure 626 mmHg  AOp Diastolic Pressure 51 mmHg  AOp Mean Pressure 76 mmHg  LVp Systolic Pressure 948 mmHg  LVp Diastolic Pressure 0 mmHg  LVp EDP Pressure 14 mmHg  QP/QS 1  TPVR Index 5.72 HRUI      ADDENDUM REPORT: 04/27/2020 15:59   CLINICAL DATA:  Aortic stenosis   EXAM: Cardiac TAVR CT   TECHNIQUE: The patient was scanned on a Siemens Force 546 slice scanner. A 120 kV retrospective scan was triggered in the descending thoracic aorta at 111 HU's. Gantry rotation speed was 270 msecs and collimation was .9 mm. No beta blockade or nitro were given. The 3D data set was reconstructed in 5% intervals of the R-R cycle. Systolic and diastolic phases were analyzed on a dedicated work station using MPR, MIP and VRT modes. The patient received 100 cc of contrast.   FINDINGS: Aortic Valve: Moderately thickened aortic valve with moderate calcification and reduced excursion the planimeter valve area is 0.9 Sq cm consistent with severe aortic stenosis.   Number of leaflets: 3   LVOT calcification: none   Annular calcification: minimal   Aortic Valve Calcium Score: 645   Prosthetic Valve: NA   Mitral Valve: Moderate mitral annular calcification noted.   Left Atrium: Severe dilation noted.   Aortic Annulus Measurements   Major annulus diameter: 29 mm   Minor annulus diameter: 22 mm   Annular  perimeter: 78 mm   Annular area: 4.6 cm2   Aortic Root Measurements- Diastole   Sinus of Valsalva perimeter: 105   Sinotubular Junction: 28 mm   Ascending Thoracic Aorta: 34 mm   Aortic Arch: 29 mm  Descending Thoracic Aorta:  34   Sinus of Valsalva Measurements (cusp to cusp):   Non-Right coronary cusp width: 32 mm   Right- Left-coronary cusp width: 26 mm   Left- Non- coronary cusp width: 32 mm   Coronary Artery Height above Annulus:   Left Main: 8 mm (low coronary height)   Right Coronary: 14 mm   Coronary Arteries: No nitroglycerin administered for non-coronary assessment.   Coronary Calcium Score:   Left main: 30   Left anterior descending artery: 317   Left circumflex artery: 290   Right coronary artery: 336   Total: 973   Percentile: 89th for age, sex, and race matched control.   Optimum Fluoroscopic Angle for Delivery: Three cusp view: LAO 33, CAU 4; Anterior view: RAO 0, CAU 46.   Valves for structural team consideration: 29 mm CoreValve or 26 mm Edwards Sapien Valve   IMPRESSION: 1. Severe Aortic stenosis. Findings pertinent to TAVR procedure are detailed above.   2.  Cardiac silhouette is rotated.   3. Patient's total coronary artery calcium score is 973, which is 89th percentile for subjects of the same age, gender, and race based populations.   RECOMMENDATIONS:   Coronary artery calcium (CAC) score is a strong predictor of incident coronary heart disease (CHD) and provides predictive information beyond traditional risk factors. CAC scoring is reasonable to use in the decision to withhold, postpone, or initiate statin therapy in intermediate-risk or selected borderline-risk asymptomatic adults (age 43-75 years and LDL-C ?70 to <190 mg/dL) who do not have diabetes or established atherosclerotic cardiovascular disease (ASCVD).* In intermediate-risk (10-year ASCVD risk ?7.5% to <20%) adults or selected borderline-risk (10-year ASCVD  risk ?5% to <7.5%) adults in whom a CAC score is measured for the purpose of making a treatment decision the following recommendations have been made:   If CAC = 0, it is reasonable to withhold statin therapy and reassess in 5 to 10 years, as long as higher risk conditions are absent (diabetes mellitus, family history of premature CHD in first degree relatives (males <55 years; females <65 years), cigarette smoking, LDL ?190 mg/dL or other independent risk factors).   If CAC is 1 to 99, it is reasonable to initiate statin therapy for patients ?83 years of age.   If CAC is ?100 or ?75th percentile, it is reasonable to initiate statin therapy at any age.   Cardiology referral should be considered for patients with CAC scores ?400 or ?75th percentile.   *2018 AHA/ACC/AACVPR/AAPA/ABC/ACPM/ADA/AGS/APhA/ASPC/NLA/PCNA Guideline on the Management of Blood Cholesterol: A Report of the American College of Cardiology/American Heart Association Task Force on Clinical Practice Guidelines. J Am Coll Cardiol. 2019;73(24):3168-3209.   Mahesh  Chandrasekhar     Electronically Signed   By: Rudean Haskell MD   On: 04/27/2020 15:59   Narrative & Impression  CLINICAL DATA:  Initial treatment strategy for lung nodule.   EXAM: NUCLEAR MEDICINE PET SKULL BASE TO THIGH   TECHNIQUE: 12.7 mCi F-18 FDG was injected intravenously. Full-ring PET imaging was performed from the skull base to thigh after the radiotracer. CT data was obtained and used for attenuation correction and anatomic localization.   Fasting blood glucose: 240 mg/dl   COMPARISON:  CT chest 04/25/2020, 11/04/2013.   FINDINGS: Mediastinal blood pool activity: SUV max 3.1   Liver activity: SUV max 4.1   NECK: No hypermetabolic lymph nodes in the neck.   Incidental CT findings: none   CHEST: Persistent nodule in the RIGHT middle lobe measures 13  mm x 12 mm compared to 13 mm 12 mm on comparison exam 04/25/2020.  Nodule increased in size and density from 11/04/2013. This nodule has low but measurable metabolic activity with SUV max equal 1.5.   No additional pulmonary nodularity.   No hypermetabolic mediastinal lymph nodes.   Incidental CT findings: Coronary artery calcification and aortic atherosclerotic calcification. Small pericardial effusion.   ABDOMEN/PELVIS: No abnormal hypermetabolic activity within the liver, pancreas, adrenal glands, or spleen. No hypermetabolic lymph nodes in the abdomen or pelvis.   Incidental CT findings: Thickening LEFT adrenal gland to 18 mm has low attenuation consistent benign adenoma. Simple fluid cyst of the RIGHT kidney.   Lesion of concern in the LEFT kidney may have mild associated metabolic activity. Lesion difficult to define on noncontrast CT.   Atherosclerotic calcification of the aorta. Multiple diverticula of the descending colon and sigmoid colon without acute inflammation.   SKELETON: No focal hypermetabolic activity to suggest skeletal metastasis.   Incidental CT findings: none   IMPRESSION: 1. Low metabolic activity associated with the RIGHT upper lobe nodule is not typical of bronchogenic carcinoma; however, low-grade adenocarcinoma can exhibit low metabolic activity. As lesion is increased in size/density from 2015 recommend tissue sampling. 2. No evidence of metastatic mediastinal lymphadenopathy. 3. Lesion of concerning lesion in the LEFT kidney may have metabolic activity however difficult to localize on this noncontrast CT study. Recommend dedicated renal MRI with contrast for further evaluation.   These results will be called to the ordering clinician or representative by the Radiologist Assistant, and communication documented in the PACS or [employer].     Electronically Signed   By: Suzy Bouchard M.D.   On: 05/13/2020 11:55      Narrative & Impression  CLINICAL DATA:  Lung nodule.  Possible renal mass.    EXAM: MRI HEAD WITHOUT AND WITH CONTRAST   TECHNIQUE: Multiplanar, multiecho pulse sequences of the brain and surrounding structures were obtained without and with intravenous contrast.   CONTRAST:  105mL GADAVIST GADOBUTROL 1 MMOL/ML IV SOLN   COMPARISON:  None.   FINDINGS: Brain: Mild atrophy and white matter changes are present bilaterally, likely within normal limits for age. The ventricles are proportionate to the degree of atrophy.   No acute infarct, hemorrhage, or mass lesion is present.   The internal auditory canals are within normal limits. The brainstem and cerebellum are within normal limits.   Postcontrast images demonstrate no pathologic enhancement.   Vascular: Flow is present in the major intracranial arteries.   Skull and upper cervical spine: The craniocervical junction is normal. Upper cervical spine is within normal limits. Marrow signal is unremarkable.   Sinuses/Orbits: Left sphenoid sinus is opacified. Minimal mucosal thickening is present in the ethmoid air cells and inferior left maxillary sinus.   IMPRESSION: 1. No evidence for metastatic disease to the brain or meninges. 2. Normal MRI appearance of the brain for age. 3. Left sphenoid sinus disease.     Electronically Signed   By: San Morelle M.D.   On: 05/14/2020 11:48      RISK SCORES Procedure: Isolated AVR Risk of Mortality: 5.153% Renal Failure: 8.762% Permanent Stroke: 2.311% Prolonged Ventilation: 16.104% DSW Infection: 0.185% Reoperation: 4.167% Morbidity or Mortality: 23.941% Short Length of Stay: 15.424% Long Length of Stay: 14.657%   Impression:   This 85 year old patient has stage D, severe, symptomatic, low gradient, normal ejection fraction aortic stenosis with New York Heart Association class II symptoms of exertional fatigue and shortness of  breath as well as lower extremity edema consistent with chronic diastolic congestive heart failure.  I have  personally reviewed her 2D echocardiogram, cardiac catheterization, and CTA studies.  Her echocardiogram shows a trileaflet aortic valve with moderate thickening and calcification of the leaflets with a mean gradient of 25 mmHg and a valve area of 0.81 cm.  Dimensionless index 0.26.  Her lower gradient may be due to her lower stroke-volume index.  Cardiac catheterization shows mild nonobstructive coronary disease.  I agree that aortic valve replacement is indicated to prevent progressive left ventricular deterioration and improve her symptoms.  Given her advanced age I think transcatheter aortic valve replacement would be the best treatment.  Her gated cardiac CTA shows anatomy suitable for transcatheter aortic valve replacement using a SAPIEN 3 valve.  Her abdominal and pelvic CTA shows adequate pelvic vascular anatomy to allow transfemoral insertion.   The patient and her daughter were counseled at length regarding treatment alternatives for management of severe symptomatic aortic stenosis. The risks and benefits of surgical intervention has been discussed in detail. Long-term prognosis with medical therapy was discussed. Alternative approaches such as conventional surgical aortic valve replacement, transcatheter aortic valve replacement, and palliative medical therapy were compared and contrasted at length. This discussion was placed in the context of the patient's own specific clinical presentation and past medical history. All of their questions have been addressed.   Following the decision to proceed with transcatheter aortic valve replacement, a discussion was held regarding what types of management strategies would be attempted intraoperatively in the event of life-threatening complications, including whether or not the patient would be considered a candidate for the use of cardiopulmonary bypass and/or conversion to open sternotomy for attempted surgical intervention.  I think she would be a candidate  for emergent sternotomy to manage any intraoperative complications depending on the situation.  The patient is aware of the fact that transient use of cardiopulmonary bypass may be necessary. The patient has been advised of a variety of complications that might develop including but not limited to risks of death, stroke, paravalvular leak, aortic dissection or other major vascular complications, aortic annulus rupture, device embolization, cardiac rupture or perforation, mitral regurgitation, acute myocardial infarction, arrhythmia, heart block or bradycardia requiring permanent pacemaker placement, congestive heart failure, respiratory failure, renal failure, pneumonia, infection, other late complications related to structural valve deterioration or migration, or other complications that might ultimately cause a temporary or permanent loss of functional independence or other long term morbidity. The patient provides full informed consent for the procedure as described and all questions were answered.    Her preoperative CT scans did show a 1.5 x 1.1 cm macrolobulated nodule in the anterior aspect of the right lower lobe abutting the major fissure that was present in 2015 but is larger now and was felt to be concerning for slow-growing neoplasm.  There is also a 1.8 cm heterogeneously enhancing lesion in the interpolar region of the left kidney concerning for possible renal cell carcinoma.  She has already seen urology concerning this and the plan is to do a follow-up scan in a few months.  The patient did have a PET scan done on 05/12/2020 which showed low-level metabolic activity in the right lower lobe lung nodule with an SUV max of 1.5 that is not typical of bronchogenic carcinoma but could represent a low-grade adenocarcinoma although this lesion has been present for 7 years which would make this less likely.  There is no evidence of metastatic mediastinal  lymphadenopathy.  The lesion in the left kidney was  difficult to localize.  She had a brain MRI that was negative.  I think this lesion in her right lower lobe is more likely to be a benign nodule given its chronicity.  Since it has increased in size and density since 2015 it will require continued follow-up and she should have a follow-up CT scan of the chest in 4 to 6 months to follow-up on this lesion.  I reviewed the CT scan findings with the patient and her daughter and answered all of their questions.          Plan:   Transfemoral transcatheter aortic valve replacement.        Gaye Pollack, MD

## 2020-06-30 NOTE — Progress Notes (Addendum)
Anesthesia Chart Review:   Case: [id number] Date/Time: 07/01/20 1030   Procedures:      TRANSCATHETER AORTIC VALVE REPLACEMENT, TRANSFEMORAL (Chest)     TRANSESOPHAGEAL ECHOCARDIOGRAM (TEE)   Anesthesia type: Monitor Anesthesia Care   Pre-op diagnosis: Severe Aortic Stenosis   Location: [facility] OR ROOM 16 / [place] [facility]   Surgeons: Sherren Mocha, MD     CT Surgeon: Gilford Raid, MD   DISCUSSION: Patient is an 85 year old female scheduled for the above procedure.  History includes former smoker, aortic stenosis, DM2, CKD, HTN, HLD, anemia (presumed GI bleed 01/2019, s/p PRBC; in May 2021, EGD and colonoscopy scheduled with [facility] in [place], unsure when/if done), back surgery. Mild non-obstructive CAD 03/2020 LHC. BMI is consistent with obesity.   06/27/2020 presurgical COVID-19 test negative.  Anesthesia team to evaluate on the day of surgery.   VS: BP 138/86   Pulse 92   Temp 36.8 C (Oral)   Resp 19   Ht 5\' 5"  (1.651 m)   Wt 91.4 kg   SpO2 99%   BMI 33.55 kg/m    PROVIDERS: PCP is with [facility] Rozann Lesches, MD is cardiologist Nicolette Bang MD is urologist. Evaluated 05/23/20 for left renal mass seen on 04/25/20 CT. He discussed renal ablation, surgery, or active surveillance. Patient opted for surveillance.    LABS: Preoperative labs noted. A1c 9.4%.  (all labs ordered are listed, but only abnormal results are displayed)  Labs Reviewed  GLUCOSE, CAPILLARY - Abnormal; Notable for the following components:      Result Value   Glucose-Capillary 218 (*)    All other components within normal limits  CBC - Abnormal; Notable for the following components:   WBC 11.1 (*)    All other components within normal limits  COMPREHENSIVE METABOLIC PANEL - Abnormal; Notable for the following components:   Glucose, Bld 200 (*)    Creatinine, Ser 1.17 (*)    Alkaline Phosphatase 153 (*)    GFR, Estimated 47 (*)    All other components within normal  limits  URINALYSIS, ROUTINE W REFLEX MICROSCOPIC - Abnormal; Notable for the following components:   Color, Urine STRAW (*)    All other components within normal limits  BLOOD GAS, ARTERIAL - Abnormal; Notable for the following components:   pO2, Arterial 137 (*)    Allens test (pass/fail) BRACHIAL ARTERY (*)    All other components within normal limits  HEMOGLOBIN A1C - Abnormal; Notable for the following components:   Hgb A1c MFr Bld 9.4 (*)    All other components within normal limits  SURGICAL PCR SCREEN  PROTIME-INR  TYPE AND SCREEN     IMAGES: CXR 06/27/20: FINDINGS: - Cardiac enlargement with normal vascularity. Negative for heart failure or edema. No infiltrate or effusion - Approximately 13 mm nodule right lung base unchanged from prior PET IMPRESSION: - Cardiac enlargement without acute cardiopulmonary abnormality - 13 mm nodule right lung base.  CTA Chest/abd/pelvis 04/25/20: IMPRESSION: 1. Vascular findings and measurements pertinent to potential TAVR procedure, as detailed above. 2. Severe thickening calcification of the aortic valve, compatible with reported clinical history of severe aortic stenosis. 3. 1.5 x 1.1 cm macrolobulated nodule in the anterior aspect of the right lower lobe with surrounding ground-glass attenuation, larger than prior study from 2015, highly concerning for primary bronchogenic adenocarcinoma. Further evaluation with PET-CT is recommended in the near future to better evaluate these findings. 4. 1.8 x 1.3 x 1.3 cm heterogeneously enhancing lesion in the  interpolar region of the left kidney highly concerning for primary renal cell carcinoma. This is encapsulated within Gerota's fascia appears well separated from the left renal vein which is widely patent at this time. Nonemergent Urologic consultation is recommended. 5. Mild cardiomegaly. 6. Aortic atherosclerosis, in addition to 3 vessel coronary artery disease. 7. Colonic  diverticulosis without evidence of acute diverticulitis at this time. 8. Small left adrenal adenoma. 9. Additional incidental findings, as above. These results will be called to the ordering clinician or representative by the Radiologist Assistant, and communication documented in the PACS or [employer].   EKG: 06/27/20: Sinus rhythm with Premature atrial complexes Left axis deviation Moderate voltage criteria for LVH, may be normal variant ( R in aVL , Cornell product ) Anterior infarct , age undetermined Abnormal ECG No significant change since last tracing Confirmed by Daneen Schick [id number]) on 06/28/2020 11:27:27 AM   CV: CT Coronary 04/25/20: IMPRESSION: 1. Severe Aortic stenosis. Findings pertinent to TAVR procedure are detailed above. 2.  Cardiac silhouette is rotated. 3. Patient's total coronary artery calcium score is 973, which is 89th percentile for subjects of the same age, gender, and race based populations.   Carotid US 04/11/20: Summary:  - Right Carotid: Velocities in the right ICA are consistent with a 1-39% stenosis.  - Left Carotid: Velocities in the left ICA are consistent with a 1-39% stenosis. Significant tortuosity of the left ICA noted.  - Vertebrals:  Bilateral vertebral arteries demonstrate antegrade flow.  - Subclavians: Normal flow hemodynamics were seen in bilateral subclavian arteries.   RHC/LHC 04/10/20: Mid Cx lesion is 50% stenosed. LV end diastolic pressure is mildly elevated. There is mild aortic valve stenosis. Calculated valve area 1.56 cm2. Ao sat 98%, PA sat 73%, PA pressure 39/5, mean PA pressure 19 mm Hg; mean PCWP 14 mm Hg; CO 6.5 L/min; CI 3.3. Small radial artery. Ulnar access obtained. Vasospasm noted. Nonobstructive CAD.  Mild aortic stenosis.     Echo (Limited) 04/09/20: IMPRESSIONS   1. Left ventricular ejection fraction, by estimation, is 55 to 60%. The  left ventricle has normal function. Left ventricular diastolic  parameters  are consistent with Grade II diastolic dysfunction (pseudonormalization).  Elevated left atrial pressure.   2. The pericardial effusion is circumferential.   3. Moderate mitral annular calcification.   4. Moderate aortic stenosis by gradient, severe by AVA VTI. Borderline  dimensionless index for severe at 0.26. Lower stroke volume index likely  contributing to lower than expected gradient. Essentially moderate to  severe AS more toward the severe side  of the spectrum.. The aortic valve is tricuspid. There is moderate  calcification of the aortic valve. There is moderate thickening of the  aortic valve.    Echo 02/22/20: IMPRESSIONS   1. Left ventricular ejection fraction, by estimation, is 55 to 60%. The  left ventricle has normal function. The left ventricle has no regional  wall motion abnormalities. There is moderate concentric left ventricular  hypertrophy of the basal-septal  segment. Left ventricular diastolic parameters are indeterminate.   2. Right ventricular systolic function is hyperdynamic. The right  ventricular size is normal.   3. A small pericardial effusion is present. Pericardial fat pad is  prominent.   4. The mitral valve is myxomatous. Trivial mitral valve regurgitation.  Mild to moderate mitral stenosis. The mean mitral valve gradient is, on  average 4.8 mmHg (assessed with four beats) with average heart rate of 80  bpm. Moderate mitral annular  calcification.   5.  The aortic valve was not well visualized. There is moderate  calcification of the aortic valve. There is moderate thickening of the  aortic valve. Aortic valve regurgitation is mild. Mild to moderate aortic  valve stenosis. Aortic valve area, by VTI  measures 0.89 cm. Aortic valve mean gradient measures 22.7 mmHg. Aortic  valve Vmax measures 2.98 m/s. Diminished left ventricular stroke volume  index < 35.  - Comparison(s): A prior study was performed on 06/12/19. Prior images   reviewed side by side. Similar aortic parameters from prior. Differential  includes paradoxical low flow low gradient aortic stenosis; consider low  dose dobutamine stress echo if clinically indicated.    Past Medical History:  Diagnosis Date   Aortic stenosis    Arthritis    Asthma    CKD (chronic kidney disease)    Essential hypertension    Mixed hyperlipidemia    Symptomatic anemia    Presumed GI bleed January 2021 - she declined GI work-up   Type 2 diabetes mellitus ([place])     Past Surgical History:  Procedure Laterality Date   APPENDECTOMY     BACK SURGERY     CYST REMOVAL HAND     Right, hospital in [place] WITH MEDIAL MENISECTOMY Right 02/28/2012   Procedure: KNEE ARTHROSCOPY WITH MEDIAL MENISECTOMY;  Surgeon: Carole Civil, MD;  Location: [facility];  Service: Orthopedics;  Laterality: Right;   REPAIR KNEE LIGAMENT     RIGHT/LEFT HEART CATH AND CORONARY ANGIOGRAPHY N/A 04/10/2020   Procedure: RIGHT/LEFT HEART CATH AND CORONARY ANGIOGRAPHY;  Surgeon: Jettie Booze, MD;  Location: [place] [facility];  Service: Cardiovascular;  Laterality: N/A;    MEDICATIONS:  albuterol (PROVENTIL) (2.5 MG/3ML) 0.083% nebulizer solution   allopurinol (ZYLOPRIM) 300 MG tablet   amLODipine (NORVASC) 5 MG tablet   atorvastatin (LIPITOR) 20 MG tablet   B-D UF III MINI PEN NEEDLES 31G X 5 MM MISC   furosemide (LASIX) 20 MG tablet   gabapentin (NEURONTIN) 100 MG capsule   hydrALAZINE (APRESOLINE) 25 MG tablet   Insulin Detemir (LEVEMIR FLEXTOUCH) 100 UNIT/ML Pen   loratadine (CLARITIN) 10 MG tablet   metFORMIN (GLUCOPHAGE) 500 MG tablet   ULTICARE MINI PEN NEEDLES 31G X 6 MM MISC   No current facility-administered medications for this encounter.    [START ON 07/01/2020] dexmedetomidine (PRECEDEX) 400 MCG/100ML (4 mcg/mL) infusion   [START ON 07/01/2020] heparin 30,000 units/NS 1000 mL solution for CELLSAVER   [START ON 07/01/2020] magnesium sulfate (IV Push/IM)  injection 40 mEq   [START ON 07/01/2020] norepinephrine (LEVOPHED) 4mg  in 244mL premix infusion   [START ON 07/01/2020] potassium chloride injection 80 mEq   [START ON 07/01/2020] vancomycin (VANCOREADY) IVPB 1500 mg/300 mL    Myra Gianotti, PA-C Surgical Short Stay/Anesthesiology [facility] Phone [phone] [facility] Phone [phone] 06/30/2020 2:50 PM

## 2020-07-01 ENCOUNTER — Other Ambulatory Visit: Payer: Self-pay

## 2020-07-01 ENCOUNTER — Encounter: Payer: Self-pay | Admitting: Physician Assistant

## 2020-07-01 ENCOUNTER — Encounter (HOSPITAL_COMMUNITY): Payer: Self-pay | Admitting: Cardiovascular Disease

## 2020-07-01 ENCOUNTER — Encounter (HOSPITAL_COMMUNITY)
Admission: RE | Disposition: A | Discharge status: Home / Self Care | Payer: Self-pay | Source: Home / Self Care | Attending: Surgery

## 2020-07-01 ENCOUNTER — Inpatient Hospital Stay (HOSPITAL_COMMUNITY): Payer: Medicare Other

## 2020-07-01 ENCOUNTER — Other Ambulatory Visit: Payer: Self-pay | Admitting: Physician Assistant

## 2020-07-01 ENCOUNTER — Inpatient Hospital Stay (HOSPITAL_COMMUNITY)
Admission: RE | Admit: 2020-07-01 | Discharge: 2020-07-02 | DRG: 267 | Disposition: A | Discharge status: Home / Self Care | Payer: Medicare Other | Attending: Surgery | Admitting: Surgery

## 2020-07-01 ENCOUNTER — Ambulatory Visit (HOSPITAL_COMMUNITY): Payer: Medicare Other | Attending: Cardiovascular Disease

## 2020-07-01 DIAGNOSIS — Z833 Family history of diabetes mellitus: Secondary | ICD-10-CM

## 2020-07-01 DIAGNOSIS — E782 Mixed hyperlipidemia: Secondary | ICD-10-CM | POA: Diagnosis present

## 2020-07-01 DIAGNOSIS — E1122 Type 2 diabetes mellitus with diabetic chronic kidney disease: Secondary | ICD-10-CM | POA: Diagnosis present

## 2020-07-01 DIAGNOSIS — I350 Nonrheumatic aortic (valve) stenosis: Secondary | ICD-10-CM

## 2020-07-01 DIAGNOSIS — Z006 Encounter for examination for normal comparison and control in clinical research program: Secondary | ICD-10-CM

## 2020-07-01 DIAGNOSIS — E66812 Obesity, class 2: Secondary | ICD-10-CM

## 2020-07-01 DIAGNOSIS — I700 Atherosclerosis of aorta: Secondary | ICD-10-CM | POA: Diagnosis present

## 2020-07-01 DIAGNOSIS — N2889 Other specified disorders of kidney and ureter: Secondary | ICD-10-CM | POA: Diagnosis present

## 2020-07-01 DIAGNOSIS — Z6836 Body mass index (BMI) 36.0-36.9, adult: Secondary | ICD-10-CM

## 2020-07-01 DIAGNOSIS — R911 Solitary pulmonary nodule: Secondary | ICD-10-CM | POA: Diagnosis present

## 2020-07-01 DIAGNOSIS — Z888 Allergy status to other drugs, medicaments and biological substances status: Secondary | ICD-10-CM | POA: Diagnosis not present

## 2020-07-01 DIAGNOSIS — Z01818 Encounter for other preprocedural examination: Secondary | ICD-10-CM

## 2020-07-01 DIAGNOSIS — Z952 Presence of prosthetic heart valve: Secondary | ICD-10-CM

## 2020-07-01 DIAGNOSIS — Z882 Allergy status to sulfonamides status: Secondary | ICD-10-CM

## 2020-07-01 DIAGNOSIS — Z20822 Contact with and (suspected) exposure to covid-19: Secondary | ICD-10-CM | POA: Diagnosis present

## 2020-07-01 DIAGNOSIS — Z79899 Other long term (current) drug therapy: Secondary | ICD-10-CM

## 2020-07-01 DIAGNOSIS — I5032 Chronic diastolic (congestive) heart failure: Secondary | ICD-10-CM | POA: Diagnosis not present

## 2020-07-01 DIAGNOSIS — E669 Obesity, unspecified: Secondary | ICD-10-CM | POA: Insufficient documentation

## 2020-07-01 DIAGNOSIS — Z880 Allergy status to penicillin: Secondary | ICD-10-CM

## 2020-07-01 DIAGNOSIS — I10 Essential (primary) hypertension: Secondary | ICD-10-CM | POA: Diagnosis present

## 2020-07-01 DIAGNOSIS — J449 Chronic obstructive pulmonary disease, unspecified: Secondary | ICD-10-CM | POA: Diagnosis present

## 2020-07-01 DIAGNOSIS — I313 Pericardial effusion (noninflammatory): Secondary | ICD-10-CM | POA: Diagnosis not present

## 2020-07-01 DIAGNOSIS — Z825 Family history of asthma and other chronic lower respiratory diseases: Secondary | ICD-10-CM | POA: Diagnosis not present

## 2020-07-01 DIAGNOSIS — I2510 Atherosclerotic heart disease of native coronary artery without angina pectoris: Secondary | ICD-10-CM | POA: Diagnosis present

## 2020-07-01 DIAGNOSIS — Z8249 Family history of ischemic heart disease and other diseases of the circulatory system: Secondary | ICD-10-CM

## 2020-07-01 DIAGNOSIS — I130 Hypertensive heart and chronic kidney disease with heart failure and stage 1 through stage 4 chronic kidney disease, or unspecified chronic kidney disease: Secondary | ICD-10-CM | POA: Diagnosis not present

## 2020-07-01 DIAGNOSIS — Z87891 Personal history of nicotine dependence: Secondary | ICD-10-CM

## 2020-07-01 DIAGNOSIS — I440 Atrioventricular block, first degree: Secondary | ICD-10-CM

## 2020-07-01 DIAGNOSIS — M1990 Unspecified osteoarthritis, unspecified site: Secondary | ICD-10-CM | POA: Diagnosis present

## 2020-07-01 DIAGNOSIS — I444 Left anterior fascicular block: Secondary | ICD-10-CM

## 2020-07-01 DIAGNOSIS — D649 Anemia, unspecified: Secondary | ICD-10-CM | POA: Diagnosis not present

## 2020-07-01 DIAGNOSIS — N1831 Chronic kidney disease, stage 3a: Secondary | ICD-10-CM | POA: Diagnosis not present

## 2020-07-01 DIAGNOSIS — Z794 Long term (current) use of insulin: Secondary | ICD-10-CM

## 2020-07-01 DIAGNOSIS — E119 Type 2 diabetes mellitus without complications: Secondary | ICD-10-CM

## 2020-07-01 HISTORY — DX: Nonrheumatic aortic (valve) stenosis: I35.0

## 2020-07-01 HISTORY — PX: INTRAOPERATIVE TRANSTHORACIC ECHOCARDIOGRAM: SHX6523

## 2020-07-01 HISTORY — PX: ULTRASOUND GUIDANCE FOR VASCULAR ACCESS: SHX6516

## 2020-07-01 HISTORY — DX: Presence of prosthetic heart valve: Z95.2

## 2020-07-01 HISTORY — PX: TRANSCATHETER AORTIC VALVE REPLACEMENT, TRANSFEMORAL: SHX6400

## 2020-07-01 LAB — POCT I-STAT, CHEM 8
BUN: 18 mg/dL (ref 8–23)
BUN: 18 mg/dL (ref 8–23)
Calcium, Ion: 1.39 mmol/L (ref 1.15–1.40)
Calcium, Ion: 1.42 mmol/L — ABNORMAL HIGH (ref 1.15–1.40)
Chloride: 99 mmol/L (ref 98–111)
Chloride: 101 mmol/L (ref 98–111)
Creatinine, Ser: 1 mg/dL (ref 0.44–1.00)
Creatinine, Ser: 1.1 mg/dL — ABNORMAL HIGH (ref 0.44–1.00)
Glucose, Bld: 203 mg/dL — ABNORMAL HIGH (ref 70–99)
Glucose, Bld: 186 mg/dL — ABNORMAL HIGH (ref 70–99)
HCT: 31 % — ABNORMAL LOW (ref 36.0–46.0)
HCT: 29 % — ABNORMAL LOW (ref 36.0–46.0)
Hemoglobin: 10.5 g/dL — ABNORMAL LOW (ref 12.0–15.0)
Hemoglobin: 9.9 g/dL — ABNORMAL LOW (ref 12.0–15.0)
Potassium: 3.9 mmol/L (ref 3.5–5.1)
Potassium: 4.2 mmol/L (ref 3.5–5.1)
Sodium: 137 mmol/L (ref 135–145)
Sodium: 137 mmol/L (ref 135–145)
TCO2: 27 mmol/L (ref 22–32)
TCO2: 27 mmol/L (ref 22–32)

## 2020-07-01 LAB — GLUCOSE, CAPILLARY
Glucose-Capillary: 214 mg/dL — ABNORMAL HIGH (ref 70–99)
Glucose-Capillary: 260 mg/dL — ABNORMAL HIGH (ref 70–99)
Glucose-Capillary: 186 mg/dL — ABNORMAL HIGH (ref 70–99)

## 2020-07-01 LAB — ECHOCARDIOGRAM LIMITED
AR max vel: 2.88 cm2
AV Area VTI: 2.55 cm2
AV Area mean vel: 3.27 cm2
AV Mean grad: 5 mmHg
AV Peak grad: 9.6 mmHg
Ao pk vel: 1.55 m/s

## 2020-07-01 SURGERY — IMPLANTATION, AORTIC VALVE, TRANSCATHETER, FEMORAL APPROACH
Anesthesia: Monitor Anesthesia Care | Site: Groin

## 2020-07-01 MED ORDER — MIDAZOLAM HCL 2 MG/2ML IJ SOLN
INTRAMUSCULAR | Status: AC
  Filled 2020-07-01: qty 2

## 2020-07-01 MED ORDER — HEPARIN SODIUM (PORCINE) 1000 UNIT/ML IJ SOLN
INTRAMUSCULAR | Status: AC
  Filled 2020-07-01: qty 1

## 2020-07-01 MED ORDER — IOHEXOL 300 MG/ML  SOLN
INTRAMUSCULAR | Status: DC | PRN
  Administered 2020-07-01: 10 mL

## 2020-07-01 MED ORDER — FENTANYL CITRATE (PF) 250 MCG/5ML IJ SOLN
INTRAMUSCULAR | Status: DC | PRN
  Administered 2020-07-01: 50 ug via INTRAVENOUS

## 2020-07-01 MED ORDER — SODIUM CHLORIDE 0.9 % IV SOLN: INTRAVENOUS | Status: DC

## 2020-07-01 MED ORDER — PHENYLEPHRINE HCL-NACL 20-0.9 MG/250ML-% IV SOLN: 0.0000 ug/min | INTRAVENOUS | Status: DC

## 2020-07-01 MED ORDER — MIDAZOLAM HCL 2 MG/2ML IJ SOLN
INTRAMUSCULAR | Status: DC | PRN
  Administered 2020-07-01: 1 mg via INTRAVENOUS

## 2020-07-01 MED ORDER — CHLORHEXIDINE GLUCONATE 4 % EX LIQD: 60.0000 mL | Freq: Once | CUTANEOUS | Status: DC

## 2020-07-01 MED ORDER — ASPIRIN 81 MG PO CHEW
81.0000 mg | CHEWABLE_TABLET | Freq: Every day | ORAL | Status: DC
  Administered 2020-07-02: 81 mg via ORAL
  Filled 2020-07-01: qty 1

## 2020-07-01 MED ORDER — HEPARIN SODIUM (PORCINE) 1000 UNIT/ML IJ SOLN
INTRAMUSCULAR | Status: DC | PRN
  Administered 2020-07-01: 13000 [IU] via INTRAVENOUS

## 2020-07-01 MED ORDER — CHLORHEXIDINE GLUCONATE 4 % EX LIQD: 30.0000 mL | CUTANEOUS | Status: DC

## 2020-07-01 MED ORDER — IODIXANOL 320 MG/ML IV SOLN
INTRAVENOUS | Status: DC | PRN
  Administered 2020-07-01: 28 mL via INTRA_ARTERIAL

## 2020-07-01 MED ORDER — NITROGLYCERIN IN D5W 200-5 MCG/ML-% IV SOLN: 0.0000 ug/min | INTRAVENOUS | Status: DC

## 2020-07-01 MED ORDER — 0.9 % SODIUM CHLORIDE (POUR BTL) OPTIME
TOPICAL | Status: DC | PRN
  Administered 2020-07-01: 1000 mL

## 2020-07-01 MED ORDER — SODIUM CHLORIDE 0.9 % IV SOLN: 250.0000 mL | INTRAVENOUS | Status: DC | PRN

## 2020-07-01 MED ORDER — GABAPENTIN 100 MG PO CAPS
100.0000 mg | ORAL_CAPSULE | Freq: Every day | ORAL | Status: DC
  Administered 2020-07-01: 100 mg via ORAL
  Filled 2020-07-01: qty 2

## 2020-07-01 MED ORDER — ATORVASTATIN CALCIUM 10 MG PO TABS
20.0000 mg | ORAL_TABLET | Freq: Every evening | ORAL | Status: DC
  Administered 2020-07-01: 20 mg via ORAL
  Filled 2020-07-01: qty 2

## 2020-07-01 MED ORDER — PROTAMINE SULFATE 10 MG/ML IV SOLN
INTRAVENOUS | Status: DC | PRN
  Administered 2020-07-01: 130 mg via INTRAVENOUS

## 2020-07-01 MED ORDER — MORPHINE SULFATE (PF) 2 MG/ML IV SOLN: 1.0000 mg | INTRAVENOUS | Status: DC | PRN

## 2020-07-01 MED ORDER — SODIUM CHLORIDE 0.9 % IV SOLN: 250.0000 mL | INTRAVENOUS | Status: DC

## 2020-07-01 MED ORDER — LACTATED RINGERS IV SOLN: INTRAVENOUS | Status: DC | PRN

## 2020-07-01 MED ORDER — AMLODIPINE BESYLATE 5 MG PO TABS
5.0000 mg | ORAL_TABLET | Freq: Every day | ORAL | Status: DC
  Administered 2020-07-02: 5 mg via ORAL
  Filled 2020-07-01: qty 1

## 2020-07-01 MED ORDER — CLOPIDOGREL BISULFATE 75 MG PO TABS
75.0000 mg | ORAL_TABLET | Freq: Every day | ORAL | Status: DC
  Administered 2020-07-02: 75 mg via ORAL
  Filled 2020-07-01: qty 1

## 2020-07-01 MED ORDER — OXYCODONE HCL 5 MG PO TABS
5.0000 mg | ORAL_TABLET | ORAL | Status: DC | PRN
  Administered 2020-07-02: 5 mg via ORAL
  Filled 2020-07-01: qty 1

## 2020-07-01 MED ORDER — LIDOCAINE HCL 1 % IJ SOLN
INTRAMUSCULAR | Status: DC | PRN
  Administered 2020-07-01: 6 mL

## 2020-07-01 MED ORDER — ACETAMINOPHEN 325 MG PO TABS
650.0000 mg | ORAL_TABLET | Freq: Four times a day (QID) | ORAL | Status: DC | PRN
  Administered 2020-07-01: 650 mg via ORAL
  Filled 2020-07-01: qty 2

## 2020-07-01 MED ORDER — INSULIN ASPART 100 UNIT/ML IJ SOLN
0.0000 [IU] | Freq: Three times a day (TID) | INTRAMUSCULAR | Status: DC
  Administered 2020-07-01: 12 [IU] via SUBCUTANEOUS
  Administered 2020-07-01: 4 [IU] via SUBCUTANEOUS
  Administered 2020-07-02: 8 [IU] via SUBCUTANEOUS

## 2020-07-01 MED ORDER — HYDRALAZINE HCL 25 MG PO TABS: 25.0000 mg | ORAL_TABLET | Freq: Three times a day (TID) | ORAL | Status: DC

## 2020-07-01 MED ORDER — SODIUM CHLORIDE 0.9 % IV SOLN: INTRAVENOUS | Status: DC | PRN

## 2020-07-01 MED ORDER — FUROSEMIDE 20 MG PO TABS
20.0000 mg | ORAL_TABLET | Freq: Every day | ORAL | Status: DC
  Administered 2020-07-02: 20 mg via ORAL
  Filled 2020-07-01: qty 1

## 2020-07-01 MED ORDER — ONDANSETRON HCL 4 MG/2ML IJ SOLN
4.0000 mg | Freq: Four times a day (QID) | INTRAMUSCULAR | Status: DC | PRN
  Administered 2020-07-02: 4 mg via INTRAVENOUS
  Filled 2020-07-01: qty 2

## 2020-07-01 MED ORDER — TRAMADOL HCL 50 MG PO TABS: 50.0000 mg | ORAL_TABLET | ORAL | Status: DC | PRN

## 2020-07-01 MED ORDER — CEFAZOLIN SODIUM-DEXTROSE 2-4 GM/100ML-% IV SOLN: 2.0000 g | Freq: Three times a day (TID) | INTRAVENOUS | Status: DC

## 2020-07-01 MED ORDER — VANCOMYCIN HCL 1000 MG/200ML IV SOLN
1000.0000 mg | Freq: Once | INTRAVENOUS | Status: AC
  Administered 2020-07-02: 1000 mg via INTRAVENOUS
  Filled 2020-07-01: qty 200

## 2020-07-01 MED ORDER — ACETAMINOPHEN 650 MG RE SUPP: 650.0000 mg | Freq: Four times a day (QID) | RECTAL | Status: DC | PRN

## 2020-07-01 MED ORDER — ONDANSETRON HCL 4 MG/2ML IJ SOLN
INTRAMUSCULAR | Status: DC | PRN
  Administered 2020-07-01: 4 mg via INTRAVENOUS

## 2020-07-01 MED ORDER — FENTANYL CITRATE (PF) 250 MCG/5ML IJ SOLN
INTRAMUSCULAR | Status: AC
  Filled 2020-07-01: qty 5

## 2020-07-01 MED ORDER — ONDANSETRON HCL 4 MG/2ML IJ SOLN
INTRAMUSCULAR | Status: AC
  Filled 2020-07-01: qty 2

## 2020-07-01 MED ORDER — SODIUM CHLORIDE 0.9% FLUSH
3.0000 mL | Freq: Two times a day (BID) | INTRAVENOUS | Status: DC
  Administered 2020-07-02: 3 mL via INTRAVENOUS

## 2020-07-01 MED ORDER — ALLOPURINOL 300 MG PO TABS
300.0000 mg | ORAL_TABLET | Freq: Every day | ORAL | Status: DC
  Administered 2020-07-01 – 2020-07-02 (×2): 300 mg via ORAL
  Filled 2020-07-01 (×2): qty 1

## 2020-07-01 MED ORDER — LORATADINE 10 MG PO TABS
10.0000 mg | ORAL_TABLET | Freq: Every day | ORAL | Status: DC
  Administered 2020-07-01 – 2020-07-02 (×2): 10 mg via ORAL
  Filled 2020-07-01 (×2): qty 1

## 2020-07-01 MED ORDER — SODIUM CHLORIDE 0.9 % IV SOLN
INTRAVENOUS | Status: AC
  Administered 2020-07-01: 50 mL/h via INTRAVENOUS

## 2020-07-01 MED ORDER — SODIUM CHLORIDE 0.9% FLUSH: 3.0000 mL | INTRAVENOUS | Status: DC | PRN

## 2020-07-01 MED ORDER — CHLORHEXIDINE GLUCONATE 0.12 % MT SOLN
15.0000 mL | Freq: Once | OROMUCOSAL | Status: AC
Start: 2020-07-02 — End: 2020-07-01
  Administered 2020-07-01: 15 mL via OROMUCOSAL
  Filled 2020-07-01: qty 15

## 2020-07-01 MED ORDER — PHENYLEPHRINE HCL-NACL 10-0.9 MG/250ML-% IV SOLN
0.0000 ug/min | INTRAVENOUS | Status: DC
  Filled 2020-07-01: qty 250

## 2020-07-01 SURGICAL SUPPLY — 59 items
BAG SNAP BAND KOVER 36X36 (MISCELLANEOUS) ×5 IMPLANT
BLADE STERNUM SYSTEM 6 (BLADE) IMPLANT
CABLE ADAPT CONN TEMP 6FT (ADAPTER) ×5 IMPLANT
CANISTER SUCT 3000ML PPV (MISCELLANEOUS) IMPLANT
CATH DIAG EXPO 6F AL1 (CATHETERS) ×5 IMPLANT
CATH DIAG EXPO 6F VENT PIG 145 (CATHETERS) ×10 IMPLANT
CATH S G BIP PACING (CATHETERS) ×5 IMPLANT
CHLORAPREP W/TINT 26 (MISCELLANEOUS) ×5 IMPLANT
CLOSURE MYNX CONTROL 6F/7F (Vascular Products) ×5 IMPLANT
CNTNR URN SCR LID CUP LEK RST (MISCELLANEOUS) ×8 IMPLANT
CONT SPEC 4OZ STRL OR WHT (MISCELLANEOUS) ×2
COVER BACK TABLE 80X110 HD (DRAPES) ×5 IMPLANT
DECANTER SPIKE VIAL GLASS SM (MISCELLANEOUS) ×5 IMPLANT
DERMABOND ADVANCED (GAUZE/BANDAGES/DRESSINGS) ×1
DERMABOND ADVANCED .7 DNX12 (GAUZE/BANDAGES/DRESSINGS) ×4 IMPLANT
DEVICE CLOSURE PERCLS PRGLD 6F (VASCULAR PRODUCTS) ×8 IMPLANT
DRAPE INCISE IOBAN 66X45 STRL (DRAPES) IMPLANT
DRSG TEGADERM 4X4.75 (GAUZE/BANDAGES/DRESSINGS) ×10 IMPLANT
ELECT CAUTERY BLADE 6.4 (BLADE) IMPLANT
ELECT REM PT RETURN 9FT ADLT (ELECTROSURGICAL) ×10
ELECTRODE REM PT RTRN 9FT ADLT (ELECTROSURGICAL) ×8 IMPLANT
FELT TEFLON 6X6 (MISCELLANEOUS) ×5 IMPLANT
GAUZE SPONGE 4X4 12PLY STRL (GAUZE/BANDAGES/DRESSINGS) ×5 IMPLANT
GLOVE BIO SURGEON STRL SZ7.5 (GLOVE) IMPLANT
GLOVE BIO SURGEON STRL SZ8 (GLOVE) IMPLANT
GLOVE EUDERMIC 7 POWDERFREE (GLOVE) IMPLANT
GLOVE ORTHO TXT STRL SZ7.5 (GLOVE) IMPLANT
GOWN STRL REUS W/ TWL LRG LVL3 (GOWN DISPOSABLE) IMPLANT
GOWN STRL REUS W/ TWL XL LVL3 (GOWN DISPOSABLE) ×4 IMPLANT
GOWN STRL REUS W/TWL LRG LVL3 (GOWN DISPOSABLE)
GOWN STRL REUS W/TWL XL LVL3 (GOWN DISPOSABLE) ×1
GUIDEWIRE SAF TJ AMPL .035X180 (WIRE) ×5 IMPLANT
GUIDEWIRE SAFE TJ AMPLATZ EXST (WIRE) ×5 IMPLANT
KIT BASIN OR (CUSTOM PROCEDURE TRAY) ×5 IMPLANT
KIT HEART LEFT (KITS) ×5 IMPLANT
KIT SUCTION CATH 14FR (SUCTIONS) IMPLANT
KIT TURNOVER KIT B (KITS) ×5 IMPLANT
NS IRRIG 1000ML POUR BTL (IV SOLUTION) ×5 IMPLANT
PACK ENDO MINOR (CUSTOM PROCEDURE TRAY) ×5 IMPLANT
PAD ARMBOARD 7.5X6 YLW CONV (MISCELLANEOUS) ×10 IMPLANT
PAD ELECT DEFIB RADIOL ZOLL (MISCELLANEOUS) ×5 IMPLANT
PERCLOSE PROGLIDE 6F (VASCULAR PRODUCTS) ×10
POSITIONER HEAD DONUT 9IN (MISCELLANEOUS) ×5 IMPLANT
SET MICROPUNCTURE 5F STIFF (MISCELLANEOUS) ×5 IMPLANT
SHEATH BRITE TIP 7FR 35CM (SHEATH) ×5 IMPLANT
SHEATH PINNACLE 6F 10CM (SHEATH) ×5 IMPLANT
SHEATH PINNACLE 8F 10CM (SHEATH) ×5 IMPLANT
SLEEVE REPOSITIONING LENGTH 30 (MISCELLANEOUS) ×5 IMPLANT
STOPCOCK MORSE 400PSI 3WAY (MISCELLANEOUS) ×10 IMPLANT
SUT SILK  1 MH (SUTURE) ×1
SUT SILK 1 MH (SUTURE) ×4 IMPLANT
SYR 50ML LL SCALE MARK (SYRINGE) ×5 IMPLANT
SYR MEDRAD MARK V 150ML (SYRINGE) ×5 IMPLANT
TOWEL GREEN STERILE (TOWEL DISPOSABLE) ×10 IMPLANT
TRANSDUCER W/STOPCOCK (MISCELLANEOUS) ×10 IMPLANT
TUBE SUCT INTRACARD DLP 20F (MISCELLANEOUS) IMPLANT
VALVE 26 ULTRA SAPIEN KIT (Valve) ×5 IMPLANT
WIRE EMERALD 3MM-J .035X150CM (WIRE) ×5 IMPLANT
WIRE EMERALD 3MM-J .035X260CM (WIRE) ×5 IMPLANT

## 2020-07-01 NOTE — Op Note (Signed)
[facility]   MULTIDISCIPLINARY HEART VALVE TEAM   TAVR OPERATIVE NOTE   Date of Procedure:  07/01/2020  Preoperative Diagnosis: Severe Aortic Stenosis   Postoperative Diagnosis: Same   Procedure:   Transcatheter Aortic Valve Replacement - Percutaneous Right Transfemoral Approach  Edwards Sapien 3 Ultra THV (size 26 mm, model # 9750TFX, serial # [id number])   Co-Surgeons:  Gaye Pollack, MD and Sherren Mocha, MD     Anesthesiologist:  Tamela Gammon, MD  Echocardiographer:  Jenkins Rouge, MD  Pre-operative Echo Findings: Severe aortic stenosis Normal left ventricular systolic function  Post-operative Echo Findings: No paravalvular leak Normal left ventricular systolic function   BRIEF CLINICAL NOTE AND INDICATIONS FOR SURGERY   This 85 year old patient has stage D, severe, symptomatic, low gradient, normal ejection fraction aortic stenosis with New York Heart Association class II symptoms of exertional fatigue and shortness of breath as well as lower extremity edema consistent with chronic diastolic congestive heart failure.  I have personally reviewed her 2D echocardiogram, cardiac catheterization, and CTA studies.  Her echocardiogram shows a trileaflet aortic valve with moderate thickening and calcification of the leaflets with a mean gradient of 25 mmHg and a valve area of 0.81 cm.  Dimensionless index 0.26.  Her lower gradient may be due to her lower stroke-volume index.  Cardiac catheterization shows mild nonobstructive coronary disease.  I agree that aortic valve replacement is indicated to prevent progressive left ventricular deterioration and improve her symptoms.  Given her advanced age I think transcatheter aortic valve replacement would be the best treatment.  Her gated cardiac CTA shows anatomy suitable for transcatheter aortic valve replacement using a SAPIEN 3 valve.  Her abdominal and pelvic CTA shows adequate pelvic vascular anatomy to allow  transfemoral insertion.   The patient and her daughter were counseled at length regarding treatment alternatives for management of severe symptomatic aortic stenosis. The risks and benefits of surgical intervention has been discussed in detail. Long-term prognosis with medical therapy was discussed. Alternative approaches such as conventional surgical aortic valve replacement, transcatheter aortic valve replacement, and palliative medical therapy were compared and contrasted at length. This discussion was placed in the context of the patient's own specific clinical presentation and past medical history. All of their questions have been addressed.   Following the decision to proceed with transcatheter aortic valve replacement, a discussion was held regarding what types of management strategies would be attempted intraoperatively in the event of life-threatening complications, including whether or not the patient would be considered a candidate for the use of cardiopulmonary bypass and/or conversion to open sternotomy for attempted surgical intervention.  I think she would be a candidate for emergent sternotomy to manage any intraoperative complications depending on the situation.  The patient is aware of the fact that transient use of cardiopulmonary bypass may be necessary. The patient has been advised of a variety of complications that might develop including but not limited to risks of death, stroke, paravalvular leak, aortic dissection or other major vascular complications, aortic annulus rupture, device embolization, cardiac rupture or perforation, mitral regurgitation, acute myocardial infarction, arrhythmia, heart block or bradycardia requiring permanent pacemaker placement, congestive heart failure, respiratory failure, renal failure, pneumonia, infection, other late complications related to structural valve deterioration or migration, or other complications that might ultimately cause a temporary or  permanent loss of functional independence or other long term morbidity. The patient provides full informed consent for the procedure as described and all questions were answered.  DETAILS OF THE OPERATIVE PROCEDURE  PREPARATION:    The patient was brought to the operating room on the above mentioned date and appropriate monitoring was established by the anesthesia team. The patient was placed in the supine position on the operating table.  Intravenous antibiotics were administered. The patient was monitored closely throughout the procedure under conscious sedation.    Baseline transthoracic echocardiogram was performed. The patient's abdomen and both groins were prepped and draped in a sterile manner. A time out procedure was performed.   PERIPHERAL ACCESS:    Using the modified Seldinger technique, femoral arterial and venous access was obtained with placement of 6 Fr sheaths on the left side.  A pigtail diagnostic catheter was passed through the left arterial sheath under fluoroscopic guidance into the aortic root.  A temporary transvenous pacemaker catheter was passed through the left femoral venous sheath under fluoroscopic guidance into the right ventricle.  The pacemaker was tested to ensure stable lead placement and pacemaker capture. Aortic root angiography was performed in order to determine the optimal angiographic angle for valve deployment.   TRANSFEMORAL ACCESS:   Percutaneous transfemoral access and sheath placement was performed using ultrasound guidance.  The right common femoral artery was cannulated using a micropuncture needle and appropriate location was verified using hand injection angiogram.  A pair of Abbott Perclose percutaneous closure devices were placed and a 6 French sheath replaced into the femoral artery.  The patient was heparinized systemically and ACT verified > 250 seconds.    A 14 Fr transfemoral E-sheath was introduced into the right common femoral artery  after progressively dilating over an Amplatz superstiff wire. An AL-1 catheter was used to direct a straight-tip exchange length wire across the native aortic valve into the left ventricle. This was exchanged out for a pigtail catheter and position was confirmed in the LV apex. Simultaneous LV and Ao pressures were recorded.  The pigtail catheter was exchanged for an Amplatz Extra-stiff wire in the LV apex.     BALLOON AORTIC VALVULOPLASTY:   Not performed  TRANSCATHETER HEART VALVE DEPLOYMENT:   An Edwards Sapien 3 Ultra transcatheter heart valve (size 26 mm) was prepared and crimped per manufacturer's guidelines, and the proper orientation of the valve is confirmed on the [employer] delivery system. The valve was advanced through the introducer sheath using normal technique until in an appropriate position in the abdominal aorta beyond the sheath tip. The balloon was then retracted and using the fine-tuning wheel was centered on the valve. The valve was then advanced across the aortic arch using appropriate flexion of the catheter. The valve was carefully positioned across the aortic valve annulus. The Commander catheter was retracted using normal technique. Once final position of the valve has been confirmed by angiographic assessment, the valve is deployed while temporarily holding ventilation and during rapid ventricular pacing to maintain systolic blood pressure < 50 mmHg and pulse pressure < 10 mmHg. The balloon inflation is held for >3 seconds after reaching full deployment volume. Once the balloon has fully deflated the balloon is retracted into the ascending aorta and valve function is assessed using echocardiography. There is felt to be no paravalvular leak and no central aortic insufficiency.  The patient's hemodynamic recovery following valve deployment is good.  The deployment balloon and guidewire are both removed.    PROCEDURE COMPLETION:   The sheath was removed and femoral  artery closure performed.  Protamine was administered once femoral arterial repair was complete. The temporary pacemaker, pigtail  catheters and femoral sheaths were removed with manual pressure used for hemostasis.  A Mynx femoral closure device was utilized following removal of the diagnostic sheath in the left femoral artery.  The patient tolerated the procedure well and is transported to the cath lab recovery area in stable condition. There were no immediate intraoperative complications. All sponge instrument and needle counts are verified correct at completion of the operation.   No blood products were administered during the operation.  The patient received a total of 38 mL of intravenous contrast during the procedure.   Gaye Pollack, MD 07/01/2020

## 2020-07-01 NOTE — Op Note (Signed)
[facility]   MULTIDISCIPLINARY HEART VALVE TEAM   TAVR OPERATIVE NOTE   Date of Procedure:  07/01/2020  Preoperative Diagnosis: Severe Aortic Stenosis   Postoperative Diagnosis: Same   Procedure:   Transcatheter Aortic Valve Replacement - Percutaneous Transfemoral Approach  Edwards Sapien 3 Ultra THV (size 26 mm, model # 9750TFX, serial # [id number])   Co-Surgeons:  Gaye Pollack, MD and Sherren Mocha, MD  Anesthesiologist:  Duane Boston, MD  Echocardiographer:  Jenkins Rouge, MD  Pre-operative Echo Findings: Severe aortic stenosis Normal left ventricular systolic function  Post-operative Echo Findings: No paravalvular leak Normal/unchanged left ventricular systolic function  BRIEF CLINICAL NOTE AND INDICATIONS FOR SURGERY  85 year old woman with type 2 diabetes, COPD, chronic kidney disease, and progressive aortic stenosis, presenting today for TAVR.  She was hospitalized in March 2022 with progressive shortness of breath, orthopnea, and PND.  BNP and chest x-ray findings were suggestive of congestive heart failure with flash pulmonary edema.  She was noted to have a trileaflet aortic valve with moderate calcification and thickening of the leaflets.  Mean gradient was measured at 25 mmHg, peak gradient 44 mmHg, aortic valve area 0.8 cm with a dimensionless index of 0.26 and a stroke-volume index of 32.  She is felt to exhibit severe, stage D3 aortic stenosis.  She presents today for TAVR after undergoing multidisciplinary heart team review.  During the course of the patient's preoperative work up they have been evaluated comprehensively by a multidisciplinary team of specialists coordinated through the [facility] in the [facility].  They have been demonstrated to suffer from symptomatic severe aortic stenosis as noted above. The patient has been counseled extensively as to the relative risks and benefits of all  options for the treatment of severe aortic stenosis including long term medical therapy, conventional surgery for aortic valve replacement, and transcatheter aortic valve replacement.  The patient has been independently evaluated in formal cardiac surgical consultation by Dr Cyndia Bent, who deemed the patient appropriate for TAVR. Based upon review of all of the patient's preoperative diagnostic tests they are felt to be candidate for transcatheter aortic valve replacement using the transfemoral approach as an alternative to conventional surgery.    Following the decision to proceed with transcatheter aortic valve replacement, a discussion has been held regarding what types of management strategies would be attempted intraoperatively in the event of life-threatening complications, including whether or not the patient would be considered a candidate for the use of cardiopulmonary bypass and/or conversion to open sternotomy for attempted surgical intervention.  The patient has been advised of a variety of complications that might develop peculiar to this approach including but not limited to risks of death, stroke, paravalvular leak, aortic dissection or other major vascular complications, aortic annulus rupture, device embolization, cardiac rupture or perforation, acute myocardial infarction, arrhythmia, heart block or bradycardia requiring permanent pacemaker placement, congestive heart failure, respiratory failure, renal failure, pneumonia, infection, other late complications related to structural valve deterioration or migration, or other complications that might ultimately cause a temporary or permanent loss of functional independence or other long term morbidity.  The patient provides full informed consent for the procedure as described and all questions were answered preoperatively.  DETAILS OF THE OPERATIVE PROCEDURE  PREPARATION:   The patient is brought to the operating room on the above mentioned date  and central monitoring was established by the anesthesia team including placement of a central venous catheter and radial arterial line.  The patient is placed in the supine position on the operating table.  Intravenous antibiotics are administered. The patient is monitored closely throughout the procedure under conscious sedation.    Baseline transthoracic echocardiogram is performed. The patient's chest, abdomen, both groins, and both lower extremities are prepared and draped in a sterile manner. A time out procedure is performed.   PERIPHERAL ACCESS:   Using ultrasound guidance, femoral arterial and venous access is obtained with placement of 6 Fr sheaths on the left side.  A pigtail diagnostic catheter was passed through the femoral arterial sheath under fluoroscopic guidance into the aortic root.  A temporary transvenous pacemaker catheter was passed through the femoral venous sheath under fluoroscopic guidance into the right ventricle.  The pacemaker was tested to ensure stable lead placement and pacemaker capture. Aortic root angiography was performed in order to determine the optimal angiographic angle for valve deployment.  TRANSFEMORAL ACCESS:  A micropuncture technique is used to access the right femoral artery under fluoroscopic and ultrasound guidance.  2 Perclose devices are deployed at 10' and 2' positions to 'PreClose' the femoral artery. An 8 French sheath is placed and then an Amplatz Superstiff wire is advanced through the sheath. This is changed out for a 14 French transfemoral E-Sheath after progressively dilating over the Superstiff wire.  An AL-1 catheter was used to direct a straight-tip exchange length wire across the native aortic valve into the left ventricle. This was exchanged out for a pigtail catheter and position was confirmed in the LV apex. Simultaneous LV and Ao pressures were recorded.  The pigtail catheter was exchanged for an Amplatz Extra-stiff wire in the LV apex.     BALLOON AORTIC VALVULOPLASTY:  Not performed  TRANSCATHETER HEART VALVE DEPLOYMENT:  An Edwards Sapien 3 transcatheter heart valve (size 26 mm) was prepared and crimped per manufacturer's guidelines, and the proper orientation of the valve is confirmed on the [employer] delivery system. The valve was advanced through the introducer sheath using normal technique until in an appropriate position in the abdominal aorta beyond the sheath tip. The balloon was then retracted and using the fine-tuning wheel was centered on the valve. The valve was then advanced across the aortic arch using appropriate flexion of the catheter. The valve was carefully positioned across the aortic valve annulus. The Commander catheter was retracted using normal technique. Once final position of the valve has been confirmed by angiographic assessment, the valve is deployed while temporarily holding ventilation and during rapid ventricular pacing to maintain systolic blood pressure < 50 mmHg and pulse pressure < 10 mmHg. The balloon inflation is held for >3 seconds after reaching full deployment volume. Once the balloon has fully deflated the balloon is retracted into the ascending aorta and valve function is assessed using echocardiography. The patient's hemodynamic recovery following valve deployment is good.  The deployment balloon and guidewire are both removed. Echo demostrated acceptable post-procedural gradients, stable mitral valve function, and no aortic insufficiency.    PROCEDURE COMPLETION:  The sheath was removed and femoral artery closure is performed using the 2 previously deployed Perclose devices.  Protamine is administered once femoral arterial repair was complete. The site is clear with no evidence of bleeding or hematoma after the sutures are tightened. The temporary pacemaker and pigtail catheters are removed. Mynx closure is used for contralateral femoral arterial hemostasis for the 6 Fr sheath.  The  patient tolerated the procedure well and is transported to the surgical intensive care in stable condition. There were  no immediate intraoperative complications. All sponge instrument and needle counts are verified correct at completion of the operation.   Sherren Mocha, MD 07/01/2020 1:24 PM

## 2020-07-01 NOTE — Transfer of Care (Signed)
Immediate Anesthesia Transfer of Care Note  Patient: Stephanie [name]  Procedure(s) Performed: TRANSCATHETER AORTIC VALVE REPLACEMENT, TRANSFEMORAL (Chest) INTRAOPERATIVE TRANSTHORACIC ECHOCARDIOGRAM (Left: Chest) ULTRASOUND GUIDANCE FOR VASCULAR ACCESS (Bilateral: Groin)  Patient Location: Cath Lab  Anesthesia Type:MAC  Level of Consciousness: drowsy and patient cooperative  Airway & Oxygen Therapy: Patient Spontanous Breathing and Patient connected to nasal cannula oxygen  Post-op Assessment: Report given to RN, Post -op Vital signs reviewed and stable and Patient moving all extremities X 4  Post vital signs: Reviewed and stable  Last Vitals:  Vitals Value Taken Time  BP 108/43 07/01/20 1314  Temp    Pulse 61 07/01/20 1318  Resp 26 07/01/20 1318  SpO2 98 % 07/01/20 1318  Vitals shown include unvalidated device data.  Last Pain:  Vitals:   07/01/20 0927  TempSrc:   PainSc: 0-No pain         Complications: No notable events documented.

## 2020-07-01 NOTE — Progress Notes (Signed)
  [place] VALVE TEAM  Patient doing well s/p TAVR. She is hemodynamically stable. Groin sites stable. 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 There is no high grade block. Arterial line discontinued and transfered  to 4E. Plan for early ambulation after bedrest completed and hopeful discharge over the next 24-48 hours.   Angelena Form PA-C  [facility]  Pager [phone]

## 2020-07-01 NOTE — Progress Notes (Signed)
Mobility Specialist: Progress Note   07/01/20 1742  Mobility  Activity Ambulated in hall  Level of Assistance Independent after set-up  Assistive Device Front wheel walker  Distance Ambulated (ft) 390 ft  Mobility Ambulated independently in hallway  Mobility Response Tolerated well  Mobility performed by Mobility specialist  $Mobility charge 1 Mobility   Pre-Mobility: 64 HR, 112/58 BP, 98% SpO2 During Mobility: 101 HR Post-Mobility: 76 HR, 132/82 BP, 98% SpO2  Pt to BSC and then agreeable to ambulate, asx throughout. Pt back to bed after walk with call bell at her side and family member present in the room.   [facility] Zoa Dowty Mobility Specialist Mobility Specialist Phone: [phone]

## 2020-07-01 NOTE — Anesthesia Procedure Notes (Signed)
Procedure Name: MAC Date/Time: 07/01/2020 11:30 AM Performed by: Sherlie Ban, RN Pre-anesthesia Checklist: Patient identified, Emergency Drugs available, Suction available, Patient being monitored and Timeout performed Patient Re-evaluated:Patient Re-evaluated prior to induction Oxygen Delivery Method: Nasal cannula Induction Type: IV induction Placement Confirmation: positive ETCO2 Dental Injury: Teeth and Oropharynx as per pre-operative assessment

## 2020-07-01 NOTE — Anesthesia Procedure Notes (Signed)
Arterial Line Insertion Start/End6/14/2022 10:13 AM, 07/01/2020 10:23 AM Performed by: Duane Boston, MD  Patient location: Pre-op. Preanesthetic checklist: patient identified, IV checked, site marked, risks and benefits discussed, surgical consent, monitors and equipment checked, pre-op evaluation, timeout performed and anesthesia consent Lidocaine 1% used for infiltration Right, brachial was placed Catheter size: 20 Fr Hand hygiene performed  and maximum sterile barriers used   Attempts: 1 Procedure performed using ultrasound guided technique. Ultrasound Notes:anatomy identified, no ultrasound evidence of intravascular and/or intraneural injection and image(s) printed for medical record Following insertion, dressing applied and Biopatch. Post procedure assessment: normal and unchanged  Post procedure complications: second provider assisted. Patient tolerated the procedure well with no immediate complications.

## 2020-07-01 NOTE — Progress Notes (Signed)
Pt arrived to 4e from cath lab. Bilateral groin sites level 0. Vitals obtained and stable. Telemetry box applied and CCMD notified x2 verifiers. CHG bath completed.

## 2020-07-01 NOTE — Interval H&P Note (Signed)
History and Physical Interval Note:  07/01/2020 11:14 AM  Stephanie [name]  has presented today for surgery, with the diagnosis of Severe Aortic Stenosis.  The various methods of treatment have been discussed with the patient and family. After consideration of risks, benefits and other options for treatment, the patient has consented to  Procedure(s): TRANSCATHETER AORTIC VALVE REPLACEMENT, TRANSFEMORAL (N/A) TRANSESOPHAGEAL ECHOCARDIOGRAM (TEE) (N/A) as a surgical intervention.  The patient's history has been reviewed, patient examined, no change in status, stable for surgery.  I have reviewed the patient's chart and labs.  Questions were answered to the patient's satisfaction.     Gaye Pollack

## 2020-07-01 NOTE — Anesthesia Postprocedure Evaluation (Signed)
Anesthesia Post Note  Patient: Stephanie [name]  Procedure(s) Performed: TRANSCATHETER AORTIC VALVE REPLACEMENT, TRANSFEMORAL (Chest) INTRAOPERATIVE TRANSTHORACIC ECHOCARDIOGRAM (Left: Chest) ULTRASOUND GUIDANCE FOR VASCULAR ACCESS (Bilateral: Groin)     Patient location during evaluation: PACU Anesthesia Type: MAC Level of consciousness: awake and alert Pain management: pain level controlled Vital Signs Assessment: post-procedure vital signs reviewed and stable Respiratory status: spontaneous breathing and respiratory function stable Cardiovascular status: stable Postop Assessment: no apparent nausea or vomiting Anesthetic complications: no   No notable events documented.  Last Vitals:  Vitals:   07/01/20 1430 07/01/20 1435  BP: (!) 105/46 (!) 113/59  Pulse: (!) 52 (!) 54  Resp: (!) 28 (!) 26  Temp:    SpO2: (!) 86% 92%    Last Pain:  Vitals:   07/01/20 0927  TempSrc:   PainSc: 0-No pain                 Amber Guthridge DANIEL

## 2020-07-01 NOTE — Progress Notes (Signed)
  Echocardiogram 2D Echocardiogram has been performed.  Stephanie [name] 07/01/2020, 1:00 PM

## 2020-07-02 ENCOUNTER — Inpatient Hospital Stay (HOSPITAL_COMMUNITY): Payer: Medicare Other

## 2020-07-02 ENCOUNTER — Encounter (HOSPITAL_COMMUNITY): Payer: Self-pay | Admitting: Cardiovascular Disease

## 2020-07-02 ENCOUNTER — Inpatient Hospital Stay (HOSPITAL_COMMUNITY)
Admission: RE | Admit: 2020-07-02 | Discharge: 2020-07-02 | Disposition: A | Discharge status: Home / Self Care | Payer: Medicare Other | Source: Home / Self Care | Attending: Physician Assistant | Admitting: Physician Assistant

## 2020-07-02 DIAGNOSIS — Z09 Encounter for follow-up examination after completed treatment for conditions other than malignant neoplasm: Secondary | ICD-10-CM

## 2020-07-02 DIAGNOSIS — I440 Atrioventricular block, first degree: Secondary | ICD-10-CM

## 2020-07-02 DIAGNOSIS — Z952 Presence of prosthetic heart valve: Secondary | ICD-10-CM

## 2020-07-02 DIAGNOSIS — I447 Left bundle-branch block, unspecified: Secondary | ICD-10-CM

## 2020-07-02 DIAGNOSIS — I444 Left anterior fascicular block: Secondary | ICD-10-CM

## 2020-07-02 LAB — ECHOCARDIOGRAM COMPLETE
AR max vel: 2.4 cm2
AV Area VTI: 2.15 cm2
AV Area mean vel: 2.51 cm2
AV Mean grad: 12 mmHg
AV Peak grad: 23.2 mmHg
Ao pk vel: 2.41 m/s
Area-P 1/2: 5.38 cm2
Calc EF: 57.1 %
Height: 65 in
MV VTI: 2.22 cm2
S' Lateral: 2.6 cm
Single Plane A2C EF: 57.3 %
Single Plane A4C EF: 57.6 %
Weight: 3202.84 oz

## 2020-07-02 LAB — BASIC METABOLIC PANEL
Anion gap: 8 (ref 5–15)
BUN: 17 mg/dL (ref 8–23)
CO2: 27 mmol/L (ref 22–32)
Calcium: 10 mg/dL (ref 8.9–10.3)
Chloride: 101 mmol/L (ref 98–111)
Creatinine, Ser: 1.08 mg/dL — ABNORMAL HIGH (ref 0.44–1.00)
GFR, Estimated: 51 mL/min — ABNORMAL LOW (ref >60)
Glucose, Bld: 148 mg/dL — ABNORMAL HIGH (ref 70–99)
Potassium: 4.3 mmol/L (ref 3.5–5.1)
Sodium: 136 mmol/L (ref 135–145)

## 2020-07-02 LAB — CBC
HCT: 36.4 % (ref 36.0–46.0)
Hemoglobin: 11.2 g/dL — ABNORMAL LOW (ref 12.0–15.0)
MCH: 26.7 pg (ref 26.0–34.0)
MCHC: 30.8 g/dL (ref 30.0–36.0)
MCV: 86.9 fL (ref 80.0–100.0)
Platelets: 220 10*3/uL (ref 150–400)
RBC: 4.19 MIL/uL (ref 3.87–5.11)
RDW: 13.8 % (ref 11.5–15.5)
WBC: 12.2 10*3/uL — ABNORMAL HIGH (ref 4.0–10.5)
nRBC: 0 % (ref 0.0–0.2)

## 2020-07-02 LAB — MAGNESIUM: Magnesium: 1.8 mg/dL (ref 1.7–2.4)

## 2020-07-02 LAB — GLUCOSE, CAPILLARY
Glucose-Capillary: 227 mg/dL — ABNORMAL HIGH (ref 70–99)
Glucose-Capillary: 223 mg/dL — ABNORMAL HIGH (ref 70–99)

## 2020-07-02 MED ORDER — CLOPIDOGREL BISULFATE 75 MG PO TABS: 75.0000 mg | ORAL_TABLET | Freq: Every day | ORAL | 1 refills | Status: DC

## 2020-07-02 MED ORDER — ASPIRIN 81 MG PO CHEW: 81.0000 mg | CHEWABLE_TABLET | Freq: Every day | ORAL | 3 refills | Status: DC

## 2020-07-02 MED ORDER — METFORMIN HCL 500 MG PO TABS: 500.0000 mg | ORAL_TABLET | Freq: Two times a day (BID) | ORAL | Status: DC

## 2020-07-02 NOTE — Discharge Summary (Addendum)
[facility] VALVE TEAM  Discharge Summary    Patient ID: Stephanie [name] MRN: [id number]; DOB: [birth year]  Admit date: 07/01/2020 Discharge date: 07/02/2020  Primary Care Provider: Abran Richard, MD  Primary Cardiologist: Rozann Lesches, MD / Dr. Burt Knack & Dr. Cyndia Bent (TAVR)  Discharge Diagnoses    Principal Problem:   S/P TAVR (transcatheter aortic valve replacement) Active Problems:   Essential hypertension, benign   Class 2 severe obesity due to excess calories with serious comorbidity and body mass index (BMI) of 36.0 to 36.9 in adult [facility])   DM (diabetes mellitus) (HCC)   Severe aortic stenosis   Allergies Allergies  Allergen Reactions   Amoxicillin Swelling    Tongue swelling    Macrobid [Nitrofurantoin] Nausea And Vomiting   Penicillins Swelling    Has patient had a PCN reaction causing immediate rash, facial/tongue/throat swelling, SOB or lightheadedness with hypotension: No Has patient had a PCN reaction causing severe rash involving mucus membranes or skin necrosis: No Has patient had a PCN reaction that required hospitalization: No Has patient had a PCN reaction occurring within the last 10 years: No If all of the above answers are "NO", then may proceed with Cephalosporin use.    Sulfa Antibiotics Swelling    Lip swelling    Diagnostic Studies/Procedures    TAVR OPERATIVE NOTE     Date of Procedure:                07/01/2020   Preoperative Diagnosis:      Severe Aortic Stenosis   Postoperative Diagnosis:    Same   Procedure:        Transcatheter Aortic Valve Replacement - Percutaneous Transfemoral Approach             Edwards Sapien 3 Ultra THV (size 26 mm, model # 9750TFX, serial # [id number])              Co-Surgeons:                        Gaye Pollack, MD and Sherren Mocha, MD   Anesthesiologist:                  Duane Boston, MD   Echocardiographer:              Jenkins Rouge, MD   Pre-operative  Echo Findings: Severe aortic stenosis Normal left ventricular systolic function   Post-operative Echo Findings: No paravalvular leak Normal/unchanged left ventricular systolic function   _____________  Echo 07/02/2020: completed but pending formal read at the time of discharge     History of Present Illness     Stephanie [name] is a 85 y.o. female with a history of anemia, arthritis, asthma,  CKD stage IIIA, COPD, HTN, HLD, DMT2 and severe aortic stenosis who presented to [facility] on 07/01/20 for planned TAVR.  She was admitted to [facility] in 03/2020 for flash pulmonary edema that improved with IV lasix. 2D echocardiogram showed a trileaflet aortic valve with moderate calcification and thickening of the leaflets.  The mean gradient was measured at 25 mmHg with a peak gradient of 44 mmHg.  Aortic valve area by VTI was 0.81 cm with a dimensionless index of 0.26 and a stroke-volume index of 32.  Left ventricular ejection fraction was 55 to 60% with grade 2 diastolic dysfunction.  She was transferred to [facility] and underwent cardiac catheterization showing nonobstructive coronary disease with 50% left  circumflex stenosis.  The aortic valve mean gradient was measured at 30 mmHg with a peak gradient of 26 mmHg.  Aortic valve area was measured at 1.56 cm.  Cardiac index was 3.3.  LVEDP was 18.  Right heart pressures were within normal limits.  She was discharged home on Lasix 20 mg daily.    The patient has been evaluated by the multidisciplinary valve team and felt to have severe, symptomatic aortic stenosis and to be a suitable candidate for TAVR, which was set up for 07/01/20.   Hospital Course     Consultants: none   Severe AS: s/p successful TAVR with a 26 mm Edwards Sapien 3 Ultra THV via the TF approach on 07/01/2020. Post operative echo completed but pending formal read at the time of discharge. Groin sites are stable. 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  Started on DAPT with Asprin and Plavix, which will be continued x 87months followed by aspirin alone. Plan for discharge home today with follow up in the office in 2 weeks.    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 Resume home meds at discharge. Okay to resume Metformin after 48 hours after contrast dye exposure (6/16 PM)   HTN: resumed on home BP meds  Renal mass: found on pre TAVR CT and followed by Dr. Alyson Ingles with urology  Lung nodule: preoperative CT scans did show a 1.5 x 1.1 cm macrolobulated nodule in the anterior aspect of the right lower lobe abutting the major fissure that was present in 2015 but is larger now and was felt to be concerning for slow-growing neoplasm. PET scan 05/12/2020 showed low-level metabolic activity in the right lower lobe lung nodule with an SUV max of 1.5 that is not typical of bronchogenic carcinoma but could represent a low-grade adenocarcinoma although this lesion has been present for 7 years which would make this less likely.  There is no evidence of metastatic mediastinal lymphadenopathy. She had a brain MRI that was negative.  Dr. Cyndia Bent felt this lesion in her right lower lobe is more likely to be a benign nodule given its chronicity.  Since it has increased in size and density since 2015 it will require continued follow-up and she should have a follow-up CT scan of the chest in 4 to 6 months to follow-up on this lesion. This will be addressed in the outpatient setting.   Questionable hypoxia: cardiac rehab called to make me aware that 02 sats in 80s at rest. I personally walked up to room and took her off 02 and watched pulse ox. This showed 02 sats ranging from 90-96% with a good waveform. Pt denied SOB. I do not think 02 is indicated at this time. The pt will monitor at home with pulse oximetry. _____________  Discharge Vitals Blood pressure (!) 155/76, pulse 79,  temperature 99.2 F (37.3 C), temperature source Oral, resp. rate 17, height 5\' 5"  (1.651 m), weight 90.8 kg, SpO2 98 %.  Filed Weights   07/01/20 0856 07/02/20 0345  Weight: 91.2 kg 90.8 kg    Labs & Radiologic Studies    CBC Recent Labs    07/01/20 1342 07/02/20 0102  WBC  --  12.2*  HGB 9.9* 11.2*  HCT 29.0* 36.4  MCV  --  86.9  PLT  --  102   Basic Metabolic  Panel Recent Labs    07/01/20 1342 07/02/20 0102  NA 137 136  K 4.2 4.3  CL 101 101  CO2  --  27  GLUCOSE 186* 148*  BUN 18 17  CREATININE 1.10* 1.08*  CALCIUM  --  10.0  MG  --  1.8   Liver Function Tests No results for input(s): AST, ALT, ALKPHOS, BILITOT, PROT, ALBUMIN in the last 72 hours. No results for input(s): LIPASE, AMYLASE in the last 72 hours. Cardiac Enzymes No results for input(s): CKTOTAL, CKMB, CKMBINDEX, TROPONINI in the last 72 hours. BNP Invalid input(s): POCBNP D-Dimer No results for input(s): DDIMER in the last 72 hours. Hemoglobin A1C No results for input(s): HGBA1C in the last 72 hours. Fasting Lipid Panel No results for input(s): CHOL, HDL, LDLCALC, TRIG, CHOLHDL, LDLDIRECT in the last 72 hours. Thyroid Function Tests No results for input(s): TSH, T4TOTAL, T3FREE, THYROIDAB in the last 72 hours.  Invalid input(s): FREET3 _____________  DG Chest 2 View  Result Date: 06/30/2020 CLINICAL DATA:  Severe aortic stenosis.  Lung nodule. EXAM: CHEST - 2 VIEW COMPARISON:  04/08/2020 FINDINGS: Cardiac enlargement with normal vascularity. Negative for heart failure or edema. No infiltrate or effusion Approximately 13 mm nodule right lung base unchanged from prior PET IMPRESSION: Cardiac enlargement without acute cardiopulmonary abnormality 13 mm nodule right lung base. Electronically Signed   By: Franchot Gallo M.D.   On: 06/30/2020 07:47   ECHOCARDIOGRAM LIMITED  Result Date: 07/01/2020    ECHOCARDIOGRAM LIMITED REPORT   Patient Name:   LYNSI DOONER Date of Exam: 07/01/2020 Medical  Rec #:  [id number]         Height:       65.0 in Accession #:    [id number]        Weight:       201.0 lb Date of Birth:  [birth year]         BSA:          1.982 m Patient Age:    22 years          BP:           162/72 mmHg Patient Gender: F                 HR:           88 bpm. Exam Location:  Inpatient Procedure: Cardiac Doppler, Color Doppler and Limited Echo Indications:     Aortic stenosis  History:         Patient has prior history of Echocardiogram examinations, most                  recent 04/09/2020. COPD, Aortic Valve Disease,                  Signs/Symptoms:Shortness of Breath; Risk Factors:Hypertension,                  Dyslipidemia and Obesity.  Sonographer:     Dustin Flock Referring Phys:  [place] Diagnosing Phys: Jenkins Rouge MD IMPRESSIONS  1. Left ventricular ejection fraction, by estimation, is 60 to 65%. The left ventricle has normal function. There is mild left ventricular hypertrophy.  2. Left atrial size was mildly dilated.  3. The mitral valve is abnormal. Trivial mitral valve regurgitation. Severe mitral annular calcification.  4. Pre TAVR: tri leaflet severe AS/calcification mean gradient 26 peak 40 mmHg supine in OR AVA 1.1 cm2         Post  TAVR: well positioned 26 mm Sapien 3 valve No PVL mean gradient 5 mmHg peak 10 mmHg AVA 1.9 cm2 . The aortic valve has been repaired/replaced. There is severe calcifcation of the aortic valve. Severe aortic valve stenosis. Procedure Date: 07/01/2020. FINDINGS  Left Ventricle: Left ventricular ejection fraction, by estimation, is 60 to 65%. The left ventricle has normal function. The left ventricular internal cavity size was normal in size. There is mild left ventricular hypertrophy. Left Atrium: Left atrial size was mildly dilated. Pericardium: There is no evidence of pericardial effusion. Mitral Valve: The mitral valve is abnormal. There is moderate thickening of the mitral valve leaflet(s). There is moderate calcification of the mitral  valve leaflet(s). Severe mitral annular calcification. Trivial mitral valve regurgitation. Aortic Valve: Pre TAVR: tri leaflet severe AS/calcification mean gradient 26 peak 40 mmHg supine in OR AVA 1.1 cm2 Post TAVR: well positioned 26 mm Sapien 3 valve No PVL mean gradient 5 mmHg peak 10 mmHg AVA 1.9 cm2. The aortic valve has been repaired/replaced. There is severe calcifcation of the aortic valve. Severe aortic stenosis is present. Aortic valve mean gradient measures 5.0 mmHg. Aortic valve peak gradient measures 9.6 mmHg. Aortic valve area, by VTI measures 2.55 cm. There is a Sapien prosthetic, stented (TAVR) valve present in the aortic position. Aorta: The aortic root is normal in size and structure. LEFT VENTRICLE PLAX 2D LVOT diam:     2.50 cm LV SV:         94 LV SV Index:   48 LVOT Area:     4.91 cm  AORTIC VALVE AV Area (Vmax):    2.88 cm AV Area (Vmean):   3.27 cm AV Area (VTI):     2.55 cm AV Vmax:           155.00 cm/s AV Vmean:          104.000 cm/s AV VTI:            0.370 m AV Peak Grad:      9.6 mmHg AV Mean Grad:      5.0 mmHg LVOT Vmax:         91.00 cm/s LVOT Vmean:        69.250 cm/s LVOT VTI:          0.192 m LVOT/AV VTI ratio: 0.52  SHUNTS Systemic VTI:  0.19 m Systemic Diam: 2.50 cm Jenkins Rouge MD Electronically signed by Jenkins Rouge MD Signature Date/Time: 07/01/2020/1:10:35 PM    Final    Structural Heart Procedure  Result Date: 07/01/2020 See surgical note for result.  Disposition   Pt is being discharged home today in good condition.  Follow-up Plans & Appointments     Follow-up Information     Eileen Stanford, PA-C. Go on 07/17/2020.   Specialties: Cardiology, Radiology Why: @ 1:30pm, please arrive at least 10 minutes early. Contact information: [address] [place] [address] [phone]                  Discharge Medications   Allergies as of 07/02/2020       Reactions   Amoxicillin Swelling   Tongue swelling   Macrobid  [nitrofurantoin] Nausea And Vomiting   Penicillins Swelling   Has patient had a PCN reaction causing immediate rash, facial/tongue/throat swelling, SOB or lightheadedness with hypotension: No Has patient had a PCN reaction causing severe rash involving mucus membranes or skin necrosis: No Has patient had a PCN reaction that required hospitalization:  No Has patient had a PCN reaction occurring within the last 10 years: No If all of the above answers are "NO", then may proceed with Cephalosporin use.   Sulfa Antibiotics Swelling   Lip swelling        Medication List     TAKE these medications    albuterol (2.5 MG/3ML) 0.083% nebulizer solution Commonly known as: PROVENTIL Take 2.5 mg by nebulization every 6 (six) hours as needed for wheezing or shortness of breath.   allopurinol 300 MG tablet Commonly known as: ZYLOPRIM Take 300 mg by mouth daily.   amLODipine 5 MG tablet Commonly known as: NORVASC Take 1 tablet (5 mg total) by mouth daily.   aspirin 81 MG chewable tablet Chew 1 tablet (81 mg total) by mouth daily.   atorvastatin 20 MG tablet Commonly known as: LIPITOR Take 20 mg by mouth every evening.   B-D UF III MINI PEN NEEDLES 31G X 5 MM Misc Generic drug: Insulin Pen Needle SMARTSIG:1 Each SUB-Q Daily   UltiCare Mini Pen Needles 31G X 6 MM Misc Generic drug: Insulin Pen Needle   clopidogrel 75 MG tablet Commonly known as: PLAVIX Take 1 tablet (75 mg total) by mouth daily with breakfast.   furosemide 20 MG tablet Commonly known as: Lasix Take 1 tablet (20 mg total) by mouth daily.   gabapentin 100 MG capsule Commonly known as: NEURONTIN Take 100-200 mg by mouth at bedtime.   hydrALAZINE 25 MG tablet Commonly known as: APRESOLINE Take 1 tablet (25 mg total) by mouth 3 (three) times daily.   Levemir FlexTouch 100 UNIT/ML FlexPen Generic drug: insulin detemir Inject 30 Units into the skin at bedtime.   loratadine 10 MG tablet Commonly known as:  CLARITIN Take 10 mg by mouth daily.   metFORMIN 500 MG tablet Commonly known as: GLUCOPHAGE Take 1 tablet (500 mg total) by mouth 2 (two) times daily. Notes to patient: Okay to resume Metformin after 48 hours after contrast dye exposure (6/16 PM)             Outstanding Labs/Studies   none  Duration of Discharge Encounter   Greater than 30 minutes including physician time.  SignedAngelena Form, PA-C 07/02/2020, 9:06 AM [phone]

## 2020-07-02 NOTE — Progress Notes (Signed)
CARDIAC REHAB PHASE I   PRE:  Rate/Rhythm: 76 SR with PACs    BP: sitting 135/65    SaO2: 85-86 RA  MODE:  Ambulation: 180 ft   POST:  Rate/Rhythm: 108 sT with PVCs    BP: sitting 138/70     SaO2: 91 2L  Pt found up in recliner, SaO2 85 RA. RN reports this is a first for her this admit besides when asleep. Applied 2L and up to 91 2L. Pt initially stated she had O2 at home and we were not concerned with getting home O2. Ambulated with RW and 2L. C/o back pain. Pt does say contradictory things at times.  Pts grandson came who called daughter. Pt does not have O2 at home. Discussed walking as tolerated and restrictions. Will refer to [facility].   SATURATION QUALIFICATIONS: (This note is used to comply with regulatory documentation for home oxygen)  Patient Saturations on Room Air at Rest = 85%  Patient Saturations on Room Air while Ambulating = na%  Patient Saturations on 2 Liters of oxygen while Ambulating = 91%  Please briefly explain why patient needs home oxygen: see above  [place], ACSM 07/02/2020 10:38 AM

## 2020-07-02 NOTE — Progress Notes (Signed)
Pt discharged home with family. AVS reviewed with pt. IVs removed. Pt discharged with zio patch. Education provided.

## 2020-07-02 NOTE — Progress Notes (Signed)
1 Day Post-Op Procedure(s) (LRB): TRANSCATHETER AORTIC VALVE REPLACEMENT, TRANSFEMORAL (N/A) INTRAOPERATIVE TRANSTHORACIC ECHOCARDIOGRAM (Left) ULTRASOUND GUIDANCE FOR VASCULAR ACCESS (Bilateral) Subjective:  No complaints. Ambulated last night without difficulty. No shortness of breath.   Objective: Vital signs in last 24 hours: Temp:  [97.2 F (36.2 C)-99.2 F (37.3 C)] 99.2 F (37.3 C) (06/15 0755) Pulse Rate:  [0-242] 79 (06/15 0755) Cardiac Rhythm: Normal sinus rhythm;Heart block (06/14 2000) Resp:  [11-29] 17 (06/15 0755) BP: (95-162)/(33-95) 155/76 (06/15 0755) SpO2:  [86 %-100 %] 98 % (06/15 0755) Weight:  [90.8 kg-91.2 kg] 90.8 kg (06/15 0345)  Hemodynamic parameters for last 24 hours:    Intake/Output from previous day: 06/14 0701 - 06/15 0700 In: 2005 [P.O.:480; I.V.:1025; IV Piggyback:500] Out: 25 [Blood:25] Intake/Output this shift: No intake/output data recorded.  General appearance: alert and cooperative Neurologic: intact Heart: regular rate and rhythm, S1, S2 normal, no murmur Lungs: clear to auscultation bilaterally Extremities: extremities no edema Wound: groin sites ok  Lab Results: Recent Labs    07/01/20 1342 07/02/20 0102  WBC  --  12.2*  HGB 9.9* 11.2*  HCT 29.0* 36.4  PLT  --  220   BMET:  Recent Labs    07/01/20 1342 07/02/20 0102  NA 137 136  K 4.2 4.3  CL 101 101  CO2  --  27  GLUCOSE 186* 148*  BUN 18 17  CREATININE 1.10* 1.08*  CALCIUM  --  10.0    PT/INR: No results for input(s): LABPROT, INR in the last 72 hours. ABG    Component Value Date/Time   PHART 7.414 06/27/2020 1045   HCO3 26.2 06/27/2020 1045   TCO2 27 07/01/2020 1342   O2SAT 98.8 06/27/2020 1045   CBG (last 3)  Recent Labs    07/01/20 1721 07/01/20 2105 07/02/20 0557  GLUCAP 260* 186* 227*   ECG: Sinus, new 1st degree AV block and LAFB  Assessment/Plan: S/P Procedure(s) (LRB): TRANSCATHETER AORTIC VALVE REPLACEMENT, TRANSFEMORAL  (N/A) INTRAOPERATIVE TRANSTHORACIC ECHOCARDIOGRAM (Left) ULTRASOUND GUIDANCE FOR VASCULAR ACCESS (Bilateral)  POD 1 Hemodynamically stable in sinus rhythm. 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 Will send home on Zio patch. 2D echo pending this am. ASA and Plavix. Plan home later today.   LOS: 1 day    Gaye Pollack 07/02/2020

## 2020-07-02 NOTE — Discharge Instructions (Signed)
ACTIVITY AND EXERCISE °• Daily activity and exercise are an important part of your recovery. People recover at different rates depending on their general health and type of valve procedure. °• Most people recovering from TAVR feel better relatively quickly  °• No lifting, pushing, pulling more than 10 pounds (examples to avoid: groceries, vacuuming, gardening, golfing): °            - For one week with a procedure through the groin. °            - For six weeks for procedures through the chest wall or neck. °NOTE: You will typically see one of our providers 7-14 days after your procedure to discuss WHEN TO RESUME the above activities.  °  °  °DRIVING °• Do not drive until you are seen for follow up and cleared by a provider. Generally, we ask patient to not drive for 1 week after their procedure. °• If you have been told by your doctor in the past that you may not drive, you must talk with him/her before you begin driving again. °  °DRESSING °• Groin site: you may leave the clear dressing over the site for up to one week or until it falls off. °  °HYGIENE °• If you had a femoral (leg) procedure, you may take a shower when you return home. After the shower, pat the site dry. Do NOT use powder, oils or lotions in your groin area until the site has completely healed. °• If you had a chest procedure, you may shower when you return home unless specifically instructed not to by your discharging practitioner. °            - DO NOT scrub incision; pat dry with a towel. °            - DO NOT apply any lotions, oils, powders to the incision. °            - No tub baths / swimming for at least 2 weeks. °• If you notice any fevers, chills, increased pain, swelling, bleeding or pus, please contact your doctor. °  °ADDITIONAL INFORMATION °• If you are going to have an upcoming dental procedure, please contact our office as you will require antibiotics ahead of time to prevent infection on your heart valve.  ° ° °If you have any  questions or concerns you can call the structural heart phone during normal business hours 8am-4pm. If you have an urgent need after hours or weekends please call [phone] to talk to the on call provider for general cardiology. If you have an emergency that requires immediate attention, please call 911.  ° ° °After TAVR Checklist ° °Check  Test Description  ° Follow up appointment in 1-2 weeks  You will see our structural heart physician assistant, Katie Daniesha Driver. Your incision sites will be checked and you will be cleared to drive and resume all normal activities if you are doing well.    ° 1 month echo and follow up  You will have an echo to check on your new heart valve and be seen back in the office by Katie Kalika Smay. Many times the echo is not read by your appointment time, but Katie will call you later that day or the following day to report your results.  ° Follow up with your primary cardiologist You will need to be seen by your primary cardiologist in the following 3-6 months after your 1 month appointment in the valve   clinic. Often times your Plavix or Aspirin will be discontinued during this time, but this is decided on a case by case basis.   ° 1 year echo and follow up You will have another echo to check on your heart valve after 1 year and be seen back in the office by Katie Tashanda Fuhrer. This your last structural heart visit.  ° Bacterial endocarditis prophylaxis  You will have to take antibiotics for the rest of your life before all dental procedures (even teeth cleanings) to protect your heart valve. Antibiotics are also required before some surgeries. Please check with your cardiologist before scheduling any surgeries. Also, please make sure to tell us if you have a penicillin allergy as you will require an alternative antibiotic.   ° ° °

## 2020-07-02 NOTE — Progress Notes (Signed)
  Echocardiogram 2D Echocardiogram has been performed.  Bobbye Charleston 07/02/2020, 8:53 AM

## 2020-07-03 ENCOUNTER — Telehealth: Payer: Self-pay | Admitting: Physician Assistant

## 2020-07-03 MED FILL — Potassium Chloride Inj 2 mEq/ML: INTRAVENOUS | Qty: 40 | Status: AC

## 2020-07-03 MED FILL — Heparin Sodium (Porcine) Inj 1000 Unit/ML: INTRAMUSCULAR | Qty: 30 | Status: AC

## 2020-07-03 MED FILL — Magnesium Sulfate Inj 50%: INTRAMUSCULAR | Qty: 10 | Status: AC

## 2020-07-03 NOTE — Telephone Encounter (Signed)
  [facility]   MULTIDISCIPLINARY HEART VALVE TEAM   Attempted TOC call. No answer. Left message to call back.   Angelena Form PA-C  [facility]

## 2020-07-04 ENCOUNTER — Encounter (HOSPITAL_COMMUNITY): Payer: Self-pay | Admitting: Cardiovascular Disease

## 2020-07-17 ENCOUNTER — Telehealth: Payer: Self-pay

## 2020-07-17 ENCOUNTER — Ambulatory Visit (INDEPENDENT_AMBULATORY_CARE_PROVIDER_SITE_OTHER): Payer: Medicare Other | Admitting: Physician Assistant

## 2020-07-17 ENCOUNTER — Encounter: Payer: Self-pay | Admitting: Physician Assistant

## 2020-07-17 ENCOUNTER — Other Ambulatory Visit: Payer: Self-pay

## 2020-07-17 VITALS — BP 160/72 | HR 88 | Ht 65.0 in | Wt 225.0 lb

## 2020-07-17 DIAGNOSIS — R918 Other nonspecific abnormal finding of lung field: Secondary | ICD-10-CM

## 2020-07-17 DIAGNOSIS — N2889 Other specified disorders of kidney and ureter: Secondary | ICD-10-CM

## 2020-07-17 DIAGNOSIS — I440 Atrioventricular block, first degree: Secondary | ICD-10-CM

## 2020-07-17 DIAGNOSIS — Z79899 Other long term (current) drug therapy: Secondary | ICD-10-CM

## 2020-07-17 DIAGNOSIS — I444 Left anterior fascicular block: Secondary | ICD-10-CM

## 2020-07-17 DIAGNOSIS — Z952 Presence of prosthetic heart valve: Secondary | ICD-10-CM

## 2020-07-17 DIAGNOSIS — I10 Essential (primary) hypertension: Secondary | ICD-10-CM

## 2020-07-17 MED ORDER — HYDRALAZINE HCL 50 MG PO TABS: 50.0000 mg | ORAL_TABLET | Freq: Three times a day (TID) | ORAL | 3 refills | Status: DC

## 2020-07-17 MED ORDER — FUROSEMIDE 40 MG PO TABS: 40.0000 mg | ORAL_TABLET | Freq: Every day | ORAL | 2 refills | Status: DC

## 2020-07-17 MED ORDER — AZITHROMYCIN 500 MG PO TABS
ORAL_TABLET | ORAL | 3 refills | Status: DC
Start: 2020-07-17 — End: 2021-07-02

## 2020-07-17 NOTE — Patient Instructions (Addendum)
Medication Instructions:  Your physician has recommended you make the following change in your medication:  STOP Amlodipine (Norvasc) INCREASE Lasix to 40 mg daily INCREASE Hydralazine to 50 mg three times daily TAKE Azithromycin 500 mg 1 tablet one hour prior to dental visits/cleaning/procedures   *If you need a refill on your cardiac medications before your next appointment, please call your pharmacy*   Lab Work: Your physician recommends that you return for lab work in:1 1/2 weeks for [employer].  You can stop by a [facility] in [place] for this.  If you have labs (blood work) drawn today and your tests are completely normal, you will receive your results only by: [place] (if you have MyChart) OR A paper copy in the mail If you have any lab test that is abnormal or we need to change your treatment, we will call you to review the results.   Testing/Procedures: None ordered   Follow-Up: At [facility], you and your health needs are our priority.  As part of our continuing mission to provide you with exceptional heart care, we have created designated Provider Care Teams.  These Care Teams include your primary Cardiologist (physician) and Advanced Practice Providers (APPs -  Physician Assistants and Nurse Practitioners) who all work together to provide you with the care you need, when you need it.  We recommend signing up for the patient portal called "MyChart".  Sign up information is provided on this After Visit Summary.  MyChart is used to connect with patients for Virtual Visits (Telemedicine).  Patients are able to view lab/test results, encounter notes, upcoming appointments, etc.  Non-urgent messages can be sent to your provider as well.   To learn more about what you can do with MyChart, go to [email or web address].    Your next appointment:   08/07/20 @   4:00 pm  The format for your next appointment:   In Person  Provider:   Bonney Leitz    Thank you for  choosing [facility]!!     Other Instructions  Call [phone] about your blood pressure

## 2020-07-17 NOTE — Progress Notes (Signed)
HEART AND [place]                                     Cardiology Office Note:    Date:  07/17/2020   ID:  MIKAEL DEBELL, DOB Jun 10, 1935, MRN [id number]  PCP:  Antionette Char, MD  [facility] Cardiologist:  Rozann Lesches, MD  / Dr. Burt Knack & Dr. Cyndia Bent (TAVR) [facility] Electrophysiologist:  None   Referring MD: Abran Richard, MD   Post hospital follow up s/p TAVR  History of Present Illness:    Stephanie [name] is a 85 y.o. female with a hx of anemia, arthritis, asthma,  CKD stage IIIA, COPD, HTN, HLD, DMT2 and severe aortic stenosis s/p TAVR (07/01/20) who presents to clinic for follow up.   She was admitted to [facility] in 03/2020 for flash pulmonary edema that improved with IV lasix. 2D echocardiogram showed a trileaflet aortic valve with moderate calcification and thickening of the leaflets.  The mean gradient was measured at 25 mmHg with a peak gradient of 44 mmHg.  Aortic valve area by VTI was 0.81 cm with a dimensionless index of 0.26 and a stroke-volume index of 32.  Left ventricular ejection fraction was 55 to 60% with grade 2 diastolic dysfunction.  She was transferred to [facility] and underwent cardiac catheterization showing nonobstructive coronary disease with 50% left circumflex stenosis.  The aortic valve mean gradient was measured at 30 mmHg with a peak gradient of 26 mmHg.  Aortic valve area was measured at 1.56 cm.  Cardiac index was 3.3.  LVEDP was 18.  Right heart pressures were within normal limits.  She was discharged home on Lasix 20 mg daily.     She was evaluated by the multidisciplinary valve team and underwent successful TAVR with a 26 mm Edwards Sapien 3 Ultra THV via the TF approach on 07/01/2020. Post operative echo showed EF 60%, normally functioning TAVR with a mean gradient of 12 mmHg and no PVL. She was discharged on aspirin and Plavix. [place] showed a new 1st deg AV block and LABF and so she was discharged  with a Zio AT.   Today she presents to clinic for follow up. Here with her daughter. Feeling good aside from some LE edema and pain in the top of both of her feet. She was treated for possible gout by PCP with little improvement. Has some right sided thorax pain last week and EMS was called. Everything checked out and it resolved. Otherwise doing quite well.    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 Burt Knack and Dr. Cyndia Bent.   Severe aortic stenosis    Symptomatic anemia    Presumed GI bleed January 2021 - she declined GI work-up   Type 2 diabetes mellitus ([place])     Past Surgical History:  Procedure Laterality Date   APPENDECTOMY     BACK SURGERY     CYST REMOVAL HAND     Right, hospital in [place] ECHOCARDIOGRAM Left 07/01/2020   Procedure: INTRAOPERATIVE TRANSTHORACIC ECHOCARDIOGRAM;  Surgeon: Sherren Mocha, MD;  Location: [facility] OR;  Service: Open Heart Surgery;  Laterality: Left;   KNEE ARTHROSCOPY WITH MEDIAL MENISECTOMY Right 02/28/2012   Procedure: KNEE ARTHROSCOPY WITH MEDIAL MENISECTOMY;  Surgeon: Carole Civil, MD;  Location: [facility] ORS;  Service: Orthopedics;  Laterality: Right;   REPAIR KNEE LIGAMENT     RIGHT/LEFT HEART CATH AND CORONARY ANGIOGRAPHY N/A 04/10/2020   Procedure: RIGHT/LEFT HEART CATH AND CORONARY ANGIOGRAPHY;  Surgeon: Jettie Booze, MD;  Location: [place] CV LAB;  Service: Cardiovascular;  Laterality: N/A;   TRANSCATHETER AORTIC VALVE REPLACEMENT, TRANSFEMORAL N/A 07/01/2020   Procedure: TRANSCATHETER AORTIC VALVE REPLACEMENT, TRANSFEMORAL;  Surgeon: Sherren Mocha, MD;  Location: [place];  Service: Open Heart Surgery;  Laterality: N/A;   ULTRASOUND GUIDANCE FOR VASCULAR ACCESS Bilateral 07/01/2020    Procedure: ULTRASOUND GUIDANCE FOR VASCULAR ACCESS;  Surgeon: Sherren Mocha, MD;  Location: [place];  Service: Open Heart Surgery;  Laterality: Bilateral;    Current Medications: Current Meds  Medication Sig   albuterol (PROVENTIL) (2.5 MG/3ML) 0.083% nebulizer solution Take 2.5 mg by nebulization every 6 (six) hours as needed for wheezing or shortness of breath.    allopurinol (ZYLOPRIM) 300 MG tablet Take 300 mg by mouth daily.   amLODipine (NORVASC) 5 MG tablet Take 1 tablet (5 mg total) by mouth daily.   aspirin 81 MG chewable tablet Chew 1 tablet (81 mg total) by mouth daily.   atorvastatin (LIPITOR) 20 MG tablet Take 20 mg by mouth every evening.    azithromycin (ZITHROMAX) 500 MG tablet Take a directed   B-D UF III MINI PEN NEEDLES 31G X 5 MM MISC SMARTSIG:1 Each SUB-Q Daily   clopidogrel (PLAVIX) 75 MG tablet Take 1 tablet (75 mg total) by mouth daily with breakfast.   furosemide (LASIX) 40 MG tablet Take 1 tablet (40 mg total) by mouth daily.   gabapentin (NEURONTIN) 100 MG capsule Take 100-200 mg by mouth at bedtime.   hydrALAZINE (APRESOLINE) 50 MG tablet Take 1 tablet (50 mg total) by mouth 3 (three) times daily.   Insulin Detemir (LEVEMIR FLEXTOUCH) 100 UNIT/ML Pen Inject 30 Units into the skin at bedtime.   loratadine (CLARITIN) 10 MG tablet Take 10 mg by mouth daily.   metFORMIN (GLUCOPHAGE) 500 MG tablet Take 1 tablet (500 mg total) by mouth 2 (two) times daily.   ULTICARE MINI PEN NEEDLES 31G X 6 MM MISC    [DISCONTINUED] furosemide (LASIX) 20 MG tablet Take 1 tablet (20 mg total) by mouth daily.   [DISCONTINUED] hydrALAZINE (APRESOLINE) 25 MG tablet Take 1 tablet (25 mg total) by mouth 3 (three) times daily.     Allergies:   Amoxicillin, Macrobid [nitrofurantoin], Penicillins, and Sulfa antibiotics   Social History   Socioeconomic History   Marital status: Divorced    Spouse name: Not on file   Number of children: 3   Years of education: Not on file   Highest  education level: Not on file  Occupational History   Occupation: Retired-cook   Tobacco Use   Smoking status: Former    Years: 7.00    Pack years: 0.00    Types: Cigarettes   Smokeless tobacco: Never  Vaping Use   Vaping Use: Never used  Substance and Sexual Activity   Alcohol use: No   Drug use: No   Sexual activity: Never  Other Topics Concern   Not on file  Social History Narrative   ** Merged History Encounter **       Social Determinants of Health  Financial Resource Strain: Not on file  Food Insecurity: Not on file  Transportation Needs: Not on file  Physical Activity: Not on file  Stress: Not on file  Social Connections: Not on file     Family History: The patient's family history includes Arthritis in an other family member; Asthma in an other family member; Cancer in an other family member; Diabetes in her brother, father, mother, and another family member; Diverticulitis in her mother; Hypertension in her mother and sister; Lung disease in an other family member. There is no history of Colon cancer.  ROS:   Please see the history of present illness.    All other systems reviewed and are negative.  EKGs/Labs/Other Studies Reviewed:    The following studies were reviewed today: TAVR OPERATIVE NOTE     Date of Procedure:                07/01/2020   Preoperative Diagnosis:      Severe Aortic Stenosis   Postoperative Diagnosis:    Same   Procedure:        Transcatheter Aortic Valve Replacement - Percutaneous Transfemoral Approach             Edwards Sapien 3 Ultra THV (size 26 mm, model # 9750TFX, serial # [id number])              Co-Surgeons:                        Gaye Pollack, MD and Sherren Mocha, MD   Anesthesiologist:                  Duane Boston, MD   Echocardiographer:              Jenkins Rouge, MD   Pre-operative Echo Findings: Severe aortic stenosis Normal left ventricular systolic function   Post-operative Echo Findings: No  paravalvular leak Normal/unchanged left ventricular systolic function    _____________    Echo 07/02/2020: IMPRESSIONS   1. Left ventricular ejection fraction, by estimation, is 60 to 65%. The  left ventricle has normal function. The left ventricle has no regional  wall motion abnormalities. There is severe left ventricular hypertrophy.  Left ventricular diastolic parameters   are consistent with Grade I diastolic dysfunction (impaired relaxation).   2. Right ventricular systolic function is normal. The right ventricular  size is normal.   3. Left atrial size was mildly dilated.   4. The mitral valve is degenerative. Trivial mitral valve regurgitation.  No evidence of mitral stenosis. Moderate mitral annular calcification.   5. Post TAVR 26 mm Sapien 3 valve 07/01/20 no PVL mean gradient 12 peak 23  mmHg with AVA 2.4 cm2 and DVI 0.52. The aortic valve has been  repaired/replaced. Aortic valve regurgitation is not visualized. No aortic  stenosis is present. There is a 26 mm  Sapien prosthetic (TAVR) valve present in the aortic position. Procedure  Date: 07/01/20.   6. The inferior vena cava is normal in size with greater than 50%  respiratory variability, suggesting right atrial pressure of 3 mmHg.   EKG:  EKG is ordered today.  The ekg ordered today demonstrates sinus with PACs HR 88  Recent Labs: 04/08/2020: B Natriuretic Peptide 649.0 06/27/2020: ALT 15 07/02/2020: BUN 17; Creatinine, Ser 1.08; Hemoglobin 11.2; Magnesium 1.8; Platelets 220; Potassium 4.3; Sodium 136  Recent Lipid Panel    Component Value Date/Time   CHOL 184 04/11/2020 0945   TRIG  283 (H) 04/11/2020 0945   HDL 46 04/11/2020 0945   CHOLHDL 4.0 04/11/2020 0945   VLDL 57 (H) 04/11/2020 0945   LDLCALC 81 04/11/2020 0945     Risk Assessment/Calculations:       Physical Exam:    VS:  BP (!) 160/72   Pulse 88   Ht 5\' 5"  (1.651 m)   Wt 225 lb (102.1 kg) Comment: 225lbs  SpO2 97%   BMI 37.44 kg/m     Wt  Readings from Last 3 Encounters:  07/17/20 225 lb (102.1 kg)  07/02/20 200 lb 2.8 oz (90.8 kg)  06/27/20 201 lb 9.6 oz (91.4 kg)     GEN:  Well nourished, well developed in no acute distress, obese HEENT: Normal NECK: No JVD; LYMPHATICS: No lymphadenopathy CARDIAC: RRR, soft flow murmur. No rubs, gallops RESPIRATORY:  Clear to auscultation without rales, wheezing or rhonchi  ABDOMEN: Soft, non-tender, non-distended MUSCULOSKELETAL:  Mild LE edema; mostly in feet bilaterally SKIN: Warm and dry. Groin sites clear without hematoma or ecchymosis  NEUROLOGIC:  Alert and oriented x 3 PSYCHIATRIC:  Normal affect   ASSESSMENT:    1. S/P TAVR (transcatheter aortic valve replacement)   2. First degree AV block   3. LAFB (left anterior fascicular block)   4. Essential hypertension   5. Renal mass   6. Lung mass   7. Medication management    PLAN:    In order of problems listed above:  Severe AS s/p TAVR: she is doing well s/p TAVR. ECG with no HAVB. Groin sites are clear. Continue aspirin and plavix. SBE prophylaxis discussed; I have RX'd azithromycin due to a PCN allergy. I will see her back next month for follow up and echo.   New 1st deg AV block and LAFB: Zio AT just mailed back. No high risk alerts. ECG today shows normalization of these conduction changes.    HTN: BP is very elevated 180/90 on my personal recheck. She also has had some LE edema with foot pain. She was treated for gout with no improvement. I think this may be related to swelling. I will stop Norvasc 5 mg daily (since this can contribute to LE edema) and increase lasix from 20mg  to 40mg  daily. I will also increase hydralazine 25mg  TID from 50mg  TID. Plan for BMET in 1.5 weeks to follow kidney function and electrolytes with increased lasix. We have provided her with a BP cuff today and she will monitor her BP at home. If it remains elevated, will increase hydralazine to 75mg  TID.   Renal mass: found on pre TAVR CT and  followed by Dr. Alyson Ingles with urology   Lung nodule: preoperative CT scans did show a 1.5 x 1.1 cm macrolobulated nodule in the anterior aspect of the right lower lobe abutting the major fissure that was present in 2015 but is larger now and was felt to be concerning for slow-growing neoplasm. PET scan 05/12/2020 showed low-level metabolic activity in the right lower lobe lung nodule with an SUV max of 1.5 that is not typical of bronchogenic carcinoma but could represent a low-grade adenocarcinoma although this lesion has been present for 7 years which would make this less likely.  There is no evidence of metastatic mediastinal lymphadenopathy. She had a brain MRI that was negative.  Dr. Cyndia Bent felt this lesion in her right lower lobe is more likely to be a benign nodule given its chronicity.  Since it had increased in size and density since 2015 it  will require continued follow-up and she should have a follow-up CT scan of the chest in 4 to 6 months to follow-up on this lesion. Will get this set up for 10/2020 at 40month apt     Cardiac Rehabilitation Eligibility Assessment: The patient is NOT ready to start Cardiac Rehab due to uncontrolled HTN. If BP is better controlled at 1 month appt she will be ready.    Medication Adjustments/Labs and Tests Ordered: Current medicines are reviewed at length with the patient today.  Concerns regarding medicines are outlined above.  Orders Placed This Encounter  Procedures   Basic metabolic panel   EKG 08-MVHQ    Meds ordered this encounter  Medications   azithromycin (ZITHROMAX) 500 MG tablet    Sig: Take a directed    Dispense:  6 tablet    Refill:  3   hydrALAZINE (APRESOLINE) 50 MG tablet    Sig: Take 1 tablet (50 mg total) by mouth 3 (three) times daily.    Dispense:  270 tablet    Refill:  3    Dose increase   furosemide (LASIX) 40 MG tablet    Sig: Take 1 tablet (40 mg total) by mouth daily.    Dispense:  90 tablet    Refill:  2    Dose  increase     Patient Instructions  Medication Instructions:  Your physician has recommended you make the following change in your medication:  STOP Amlodipine (Norvasc) INCREASE Lasix to 40 mg daily INCREASE Hydralazine to 50 mg three times daily TAKE Azithromycin 500 mg 1 tablet one hour prior to dental visits/cleaning/procedures   *If you need a refill on your cardiac medications before your next appointment, please call your pharmacy*   Lab Work: Your physician recommends that you return for lab work in:1 1/2 weeks for [employer].  You can stop by a [facility] in [place] for this.  If you have labs (blood work) drawn today and your tests are completely normal, you will receive your results only by: [place] (if you have MyChart) OR A paper copy in the mail If you have any lab test that is abnormal or we need to change your treatment, we will call you to review the results.   Testing/Procedures: None ordered   Follow-Up: At [facility], you and your health needs are our priority.  As part of our continuing mission to provide you with exceptional heart care, we have created designated Provider Care Teams.  These Care Teams include your primary Cardiologist (physician) and Advanced Practice Providers (APPs -  Physician Assistants and Nurse Practitioners) who all work together to provide you with the care you need, when you need it.  We recommend signing up for the patient portal called "MyChart".  Sign up information is provided on this After Visit Summary.  MyChart is used to connect with patients for Virtual Visits (Telemedicine).  Patients are able to view lab/test results, encounter notes, upcoming appointments, etc.  Non-urgent messages can be sent to your provider as well.   To learn more about what you can do with MyChart, go to [email or web address].    Your next appointment:   08/07/20 @   4:00 pm  The format for your next appointment:   In Person  Provider:    Bonney Leitz    Thank you for choosing [facility]!!     Other Instructions  Call [phone] about your blood pressure      Signed, Angelena Form, PA-C  07/17/2020  4:10 PM    [facility]

## 2020-07-17 NOTE — Telephone Encounter (Signed)
Call from pharmacy.  Clarified that azithromycin 500 mg-  Take one tablet by mouth 1 hour prior to dental procedure.

## 2020-08-07 ENCOUNTER — Ambulatory Visit (HOSPITAL_COMMUNITY): Payer: Medicare Other | Attending: Cardiology

## 2020-08-07 ENCOUNTER — Encounter: Payer: Self-pay | Admitting: Physician Assistant

## 2020-08-07 ENCOUNTER — Other Ambulatory Visit: Payer: Self-pay

## 2020-08-07 ENCOUNTER — Other Ambulatory Visit: Payer: Self-pay | Admitting: Physician Assistant

## 2020-08-07 ENCOUNTER — Ambulatory Visit: Payer: Medicare Other | Admitting: Physician Assistant

## 2020-08-07 VITALS — BP 140/80 | HR 89 | Ht 65.0 in | Wt 198.0 lb

## 2020-08-07 DIAGNOSIS — R918 Other nonspecific abnormal finding of lung field: Secondary | ICD-10-CM

## 2020-08-07 DIAGNOSIS — N2889 Other specified disorders of kidney and ureter: Secondary | ICD-10-CM

## 2020-08-07 DIAGNOSIS — Z952 Presence of prosthetic heart valve: Secondary | ICD-10-CM

## 2020-08-07 DIAGNOSIS — I10 Essential (primary) hypertension: Secondary | ICD-10-CM

## 2020-08-07 LAB — ECHOCARDIOGRAM COMPLETE
AR max vel: 1.22 cm2
AV Area VTI: 1.18 cm2
AV Area mean vel: 1.32 cm2
AV Mean grad: 9 mmHg
AV Peak grad: 17.5 mmHg
Ao pk vel: 2.09 m/s
Area-P 1/2: 5.54 cm2
S' Lateral: 2.3 cm

## 2020-08-07 MED ORDER — CARVEDILOL 3.125 MG PO TABS: 3.1250 mg | ORAL_TABLET | Freq: Two times a day (BID) | ORAL | 3 refills | Status: DC

## 2020-08-07 NOTE — Progress Notes (Addendum)
[facility] [place]                                     Cardiology Office Note:    Date:  08/07/2020   ID:  Stephanie [name], DOB [birth year], MRN [id number]  PCP:  Antionette Char, MD  [facility] Cardiologist:  Rozann Lesches, MD  / Dr. Burt Knack & Dr. Cyndia Bent (TAVR) [facility] Electrophysiologist:  None   Referring MD: Abran Richard, MD   CC: 1 month s/p TAVR  History of Present Illness:    Stephanie [name] is a 85 y.o. female with a hx of anemia, arthritis, asthma,  CKD stage IIIA, COPD, HTN, HLD, DMT2 and severe aortic stenosis s/p TAVR (07/01/20) who presents to clinic for follow up.   She was admitted to [facility] in 03/2020 for flash pulmonary edema that improved with IV lasix. 2D echocardiogram showed a trileaflet aortic valve with moderate calcification and thickening of the leaflets.  The mean gradient was measured at 25 mmHg with a peak gradient of 44 mmHg.  Aortic valve area by VTI was 0.81 cm with a dimensionless index of 0.26 and a stroke-volume index of 32.  Left ventricular ejection fraction was 55 to 60% with grade 2 diastolic dysfunction.  She was transferred to [facility] and underwent cardiac catheterization showing nonobstructive coronary disease with 50% left circumflex stenosis.  The aortic valve mean gradient was measured at 30 mmHg with a peak gradient of 26 mmHg.  Aortic valve area was measured at 1.56 cm.  Cardiac index was 3.3.  LVEDP was 18.  Right heart pressures were within normal limits.  She was discharged home on Lasix 20 mg daily.     She was evaluated by the multidisciplinary valve team and underwent successful TAVR with a 26 mm Edwards Sapien 3 Ultra THV via the TF approach on 07/01/2020. Post operative echo showed EF 60%, normally functioning TAVR with a mean gradient of 12 mmHg and no PVL. She was discharged on aspirin and Plavix. Stephanie [name] showed a new 1st deg AV block and LABF and so she was discharged with a Zio AT.    At her last visit she had LE edema and was hypertensive. Norvasc was discontinued and lasix increased from 20mg  to 40mg  daily and hydralazine increased to 50mg  TID.   Today she presents to clinic for follow up. No CP or SOB. She does have some occasional LE edema, mostly in her feet, but this looks improved to me today.  Still having pain at the top of her feet. No orthopnea or PND. No dizziness or syncope. No blood in stool or urine. No palpitations. She feels much better since her surgery.    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 Burt Knack and Dr. Cyndia Bent.   Severe aortic stenosis    Symptomatic anemia    Presumed GI bleed January 2021 - she declined GI work-up   Type 2 diabetes mellitus ([name])     Past Surgical History:  Procedure Laterality Date   APPENDECTOMY     BACK SURGERY  CYST REMOVAL HAND     Right, hospital in [place] ECHOCARDIOGRAM Left 07/01/2020   Procedure: INTRAOPERATIVE TRANSTHORACIC ECHOCARDIOGRAM;  Surgeon: Sherren Mocha, MD;  Location: [place];  Service: Open Heart Surgery;  Laterality: Left;   KNEE ARTHROSCOPY WITH MEDIAL MENISECTOMY Right 02/28/2012   Procedure: KNEE ARTHROSCOPY WITH MEDIAL MENISECTOMY;  Surgeon: Carole Civil, MD;  Location: [facility];  Service: Orthopedics;  Laterality: Right;   REPAIR KNEE LIGAMENT     RIGHT/LEFT HEART CATH AND CORONARY ANGIOGRAPHY N/A 04/10/2020   Procedure: RIGHT/LEFT HEART CATH AND CORONARY ANGIOGRAPHY;  Surgeon: Jettie Booze, MD;  Location: [place] [facility];  Service: Cardiovascular;  Laterality: N/A;   TRANSCATHETER AORTIC VALVE REPLACEMENT, TRANSFEMORAL N/A 07/01/2020   Procedure: TRANSCATHETER AORTIC VALVE REPLACEMENT, TRANSFEMORAL;  Surgeon: Sherren Mocha, MD;  Location: [place];  Service: Open Heart Surgery;  Laterality: N/A;   ULTRASOUND GUIDANCE FOR VASCULAR ACCESS Bilateral 07/01/2020   Procedure: ULTRASOUND GUIDANCE FOR VASCULAR ACCESS;  Surgeon: Sherren Mocha, MD;  Location: [place];  Service: Open Heart Surgery;  Laterality: Bilateral;    Current Medications: Current Meds  Medication Sig   albuterol (PROVENTIL) (2.5 MG/3ML) 0.083% nebulizer solution Take 2.5 mg by nebulization every 6 (six) hours as needed for wheezing or shortness of breath.    allopurinol (ZYLOPRIM) 300 MG tablet Take 300 mg by mouth daily.   amLODipine (NORVASC) 5 MG tablet Take 1 tablet (5 mg total) by mouth daily.   aspirin 81 MG chewable tablet Chew 1 tablet (81 mg total) by mouth daily.   atorvastatin (LIPITOR) 20 MG tablet Take 20 mg by mouth every evening.    carvedilol (COREG) 3.125 MG tablet Take 1 tablet (3.125 mg total) by mouth 2 (two) times daily.   clopidogrel (PLAVIX) 75 MG tablet Take 1 tablet (75 mg total) by mouth daily with breakfast.   furosemide (LASIX) 40 MG tablet Take 1 tablet (40 mg total) by mouth daily.   gabapentin (NEURONTIN) 100 MG capsule Take 100-200 mg by mouth at bedtime.   hydrALAZINE (APRESOLINE) 50 MG tablet Take 1 tablet (50 mg total) by mouth 3 (three) times daily.   Insulin Detemir (LEVEMIR FLEXTOUCH) 100 UNIT/ML Pen Inject 30 Units into the skin at bedtime.   loratadine (CLARITIN) 10 MG tablet Take 10 mg by mouth daily.   metFORMIN (GLUCOPHAGE) 500 MG tablet Take 1 tablet (500 mg total) by mouth 2 (two) times daily.     Allergies:   Amoxicillin, Macrobid [nitrofurantoin], Penicillins, and Sulfa antibiotics   Social History   Socioeconomic History   Marital status: Divorced    Spouse name: Not on file   Number of children: 3   Years of education: Not on file   Highest education level: Not on file  Occupational History   Occupation: Retired-cook   Tobacco Use   Smoking status: Former    Years: 7.00    Types:  Cigarettes   Smokeless tobacco: Never  Vaping Use   Vaping Use: Never used  Substance and Sexual Activity   Alcohol use: No   Drug use: No   Sexual activity: Never  Other Topics Concern   Not on file  Social History Narrative   ** Merged History Encounter **       Social Determinants of Health   Financial Resource Strain: Not on file  Food Insecurity: Not on file  Transportation Needs: Not on file  Physical Activity: Not on file  Stress: Not on file  Social  Connections: Not on file     Family History: The patient's family history includes Arthritis in an other family member; Asthma in an other family member; Cancer in an other family member; Diabetes in her brother, father, mother, and another family member; Diverticulitis in her mother; Hypertension in her mother and sister; Lung disease in an other family member. There is no history of Colon cancer.  ROS:   Please see the history of present illness.    All other systems reviewed and are negative.  EKGs/Labs/Other Studies Reviewed:    The following studies were reviewed today: TAVR OPERATIVE NOTE     Date of Procedure:                07/01/2020   Preoperative Diagnosis:      Severe Aortic Stenosis   Postoperative Diagnosis:    Same   Procedure:        Transcatheter Aortic Valve Replacement - Percutaneous Transfemoral Approach             Edwards Sapien 3 Ultra THV (size 26 mm, model # 9750TFX, serial # [id number])              Co-Surgeons:                        Gaye Pollack, MD and Sherren Mocha, MD   Anesthesiologist:                  Duane Boston, MD   Echocardiographer:              Jenkins Rouge, MD   Pre-operative Echo Findings: Severe aortic stenosis Normal left ventricular systolic function   Post-operative Echo Findings: No paravalvular leak Normal/unchanged left ventricular systolic function    _____________    Echo 07/02/2020: IMPRESSIONS   1. Left ventricular ejection fraction, by  estimation, is 60 to 65%. The  left ventricle has normal function. The left ventricle has no regional  wall motion abnormalities. There is severe left ventricular hypertrophy.  Left ventricular diastolic parameters   are consistent with Grade I diastolic dysfunction (impaired relaxation).   2. Right ventricular systolic function is normal. The right ventricular  size is normal.   3. Left atrial size was mildly dilated.   4. The mitral valve is degenerative. Trivial mitral valve regurgitation.  No evidence of mitral stenosis. Moderate mitral annular calcification.   5. Post TAVR 26 mm Sapien 3 valve 07/01/20 no PVL mean gradient 12 peak 23  mmHg with AVA 2.4 cm2 and DVI 0.52. The aortic valve has been  repaired/replaced. Aortic valve regurgitation is not visualized. No aortic  stenosis is present. There is a 26 mm  Sapien prosthetic (TAVR) valve present in the aortic position. Procedure  Date: 07/01/20.   6. The inferior vena cava is normal in size with greater than 50%  respiratory variability, suggesting right atrial pressure of 3 mmHg.   _______________________   Echo 08/07/20 IMPRESSIONS  1. Mild global reduction in LV systolic function; s/p TAVR (mean gradient 9 mmHg, peak velocity 2.1 m/s; DI 0.38; no AI); compared to 07/01/20, LV function slightly reduced.  2. Left ventricular ejection fraction, by estimation, is 45 to 50%. The left ventricle has mildly decreased function. The left ventricle demonstrates global hypokinesis. There is severe left ventricular hypertrophy. Left ventricular diastolic parameters are consistent with Grade I diastolic dysfunction (impaired relaxation).  3. Right ventricular systolic function is normal. The right ventricular size is normal.  4. Left atrial size was mildly dilated.  5. The mitral valve is normal in structure. No evidence of mitral valve regurgitation. No evidence of mitral stenosis.  6. The aortic valve has been repaired/replaced. Aortic valve  regurgitation is not visualized. No aortic stenosis is present. There is a 26 mm Sapien prosthetic (TAVR) valve present in the aortic position. Procedure Date: 07/01/2020.  7. The inferior vena cava is normal in size with greater than 50% respiratory variability, suggesting right atrial pressure of 3 mmHg.  EKG:  EKG is NOT ordered today.    Recent Labs: 04/08/2020: B Natriuretic Peptide 649.0 06/27/2020: ALT 15 07/02/2020: BUN 17; Creatinine, Ser 1.08; Hemoglobin 11.2; Magnesium 1.8; Platelets 220; Potassium 4.3; Sodium 136  Recent Lipid Panel    Component Value Date/Time   CHOL 184 04/11/2020 0945   TRIG 283 (H) 04/11/2020 0945   HDL 46 04/11/2020 0945   CHOLHDL 4.0 04/11/2020 0945   VLDL 57 (H) 04/11/2020 0945   LDLCALC 81 04/11/2020 0945     Risk Assessment/Calculations:       Physical Exam:    VS:  BP 140/80   Pulse 89   Ht 5\' 5"  (1.651 m)   Wt 198 lb (89.8 kg)   BMI 32.95 kg/m     Wt Readings from Last 3 Encounters:  08/07/20 198 lb (89.8 kg)  07/17/20 225 lb (102.1 kg)  07/02/20 200 lb 2.8 oz (90.8 kg)     GEN:  Well nourished, well developed in no acute distress, obese HEENT: Normal NECK: No JVD; LYMPHATICS: No lymphadenopathy CARDIAC: RRR, soft flow murmur. No rubs, gallops RESPIRATORY:  Clear to auscultation without rales, wheezing or rhonchi  ABDOMEN: Soft, non-tender, non-distended MUSCULOSKELETAL: no LE edema.  SKIN: Warm and dry.  NEUROLOGIC:  Alert and oriented x 3 PSYCHIATRIC:  Normal affect   ASSESSMENT:    1. Abnormal findings on diagnostic imaging of lung   2. S/P TAVR (transcatheter aortic valve replacement)   3. Essential hypertension   4. Renal mass   5. Lung mass     PLAN:    In order of problems listed above:  Severe AS s/p TAVR: echo today shows EF 45-50% (mildly decreased from previous), severe LVH, normally functioning TAVR with a mean gradient of 9 mm hg and no PVL. She has NYHA class II symptoms. Continue aspirin and plavix;  she can stop plavix after 6 months (12/2020). SBE prophylaxis discussed; I have RX'd azithromycin due to a PCN allergy. I will see her back next month for follow up and echo.   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    HTN: reviewed home logs and BP much better controlled on lasix 40mg  daily and hydralazine 50mg  TID, but still borderline today. Will check BMET today given increase in diuretic. Plan to start Coreg 3.125 mg BID given mild decrease in LV function and elevated Bps with severe LVH.   Renal mass: found on pre TAVR CT and followed by Dr. Alyson Ingles with urology   Lung nodule: preoperative CT scans did show a 1.5 x 1.1 cm macrolobulated nodule in the anterior aspect of the right lower lobe abutting the major fissure that was present in 2015 but is larger now and was felt to be concerning for slow-growing neoplasm. PET scan 05/12/2020 showed low-level metabolic activity in the right lower lobe lung nodule with an SUV max of 1.5 that is not typical of bronchogenic carcinoma but could represent a low-grade adenocarcinoma although  this lesion has been present for 7 years which would make this less likely.  There is no evidence of metastatic mediastinal lymphadenopathy. She had a brain MRI that was negative.  Dr. Cyndia Bent felt this lesion in her right lower lobe is more likely to be a benign nodule given its chronicity.  Since it had increased in size and density since 2015 it will require continued follow-up and she should have a follow-up CT scan of the chest in 4 to 6 months to follow-up on this lesion. Will get this set up for 10/2020    Cardiac Rehabilitation Eligibility Assessment: Pt is now cleared to start cardiac rehab.    Medication Adjustments/Labs and Tests Ordered: Current medicines are reviewed at length with the patient today.  Concerns regarding medicines are outlined above.  Orders Placed This Encounter  Procedures   CT Chest Wo Contrast   Basic metabolic panel      Meds ordered this encounter  Medications   carvedilol (COREG) 3.125 MG tablet    Sig: Take 1 tablet (3.125 mg total) by mouth 2 (two) times daily.    Dispense:  180 tablet    Refill:  3    Order Specific Question:   Supervising Provider    Answer:   Sherren Mocha [[id number]]      Patient Instructions  Medication Instructions:  Your physician recommends that you continue on your current medications as directed. YOU CAN STOP YOUR PLAVIX IN December, WHEN YOUR PILLS RUN OUT.   *If you need a refill on your cardiac medications before your next appointment, please call your pharmacy*   Lab Work: TODAY: BMET  If you have labs (blood work) drawn today and your tests are completely normal, you will receive your results only by: [place] (if you have MyChart) OR A paper copy in the mail If you have any lab test that is abnormal or we need to change your treatment, we will call you to review the results.   Testing/Procedures: Your physician recommends that you have CT Chest without contrast, in October 2022.  Non-Cardiac CT scanning, (CAT scanning), is a noninvasive, special x-ray that produces cross-sectional images of the body using x-rays and a computer. CT scans help physicians diagnose and treat medical conditions. For some CT exams, a contrast material is used to enhance visibility in the area of the body being studied. CT scans provide greater clarity and reveal more details than regular x-ray exams.    Follow-Up: At [facility], you and your health needs are our priority.  As part of our continuing mission to provide you with exceptional heart care, we have created designated Provider Care Teams.  These Care Teams include your primary Cardiologist (physician) and Advanced Practice Providers (APPs -  Physician Assistants and Nurse Practitioners) who all work together to provide you with the care you need, when you need it.  We recommend signing up for the patient  portal called "MyChart".  Sign up information is provided on this After Visit Summary.  MyChart is used to connect with patients for Virtual Visits (Telemedicine).  Patients are able to view lab/test results, encounter notes, upcoming appointments, etc.  Non-urgent messages can be sent to your provider as well.   To learn more about what you can do with MyChart, go to [email or web address].    Your next appointment:   3-4 month(s)  The format for your next appointment:   In Person  Provider:   Rozann Lesches, MD  Other Instructions  Signed, Angelena Form, PA-C  08/07/2020 7:41 PM    [facility]

## 2020-08-07 NOTE — Patient Instructions (Addendum)
Medication Instructions:  Your physician recommends that you continue on your current medications as directed. YOU CAN STOP YOUR PLAVIX IN December, WHEN YOUR PILLS RUN OUT.   *If you need a refill on your cardiac medications before your next appointment, please call your pharmacy*   Lab Work: TODAY: BMET  If you have labs (blood work) drawn today and your tests are completely normal, you will receive your results only by: [place] (if you have MyChart) OR A paper copy in the mail If you have any lab test that is abnormal or we need to change your treatment, we will call you to review the results.   Testing/Procedures: Your physician recommends that you have CT Chest without contrast, in October 2022.  Non-Cardiac CT scanning, (CAT scanning), is a noninvasive, special x-ray that produces cross-sectional images of the body using x-rays and a computer. CT scans help physicians diagnose and treat medical conditions. For some CT exams, a contrast material is used to enhance visibility in the area of the body being studied. CT scans provide greater clarity and reveal more details than regular x-ray exams.    Follow-Up: At [facility], you and your health needs are our priority.  As part of our continuing mission to provide you with exceptional heart care, we have created designated Provider Care Teams.  These Care Teams include your primary Cardiologist (physician) and Advanced Practice Providers (APPs -  Physician Assistants and Nurse Practitioners) who all work together to provide you with the care you need, when you need it.  We recommend signing up for the patient portal called "MyChart".  Sign up information is provided on this After Visit Summary.  MyChart is used to connect with patients for Virtual Visits (Telemedicine).  Patients are able to view lab/test results, encounter notes, upcoming appointments, etc.  Non-urgent messages can be sent to your provider as well.   To learn  more about what you can do with MyChart, go to [email or web address].    Your next appointment:   3-4 month(s)  The format for your next appointment:   In Person  Provider:   Rozann Lesches, MD  Other Instructions

## 2020-08-08 ENCOUNTER — Other Ambulatory Visit: Payer: Self-pay | Admitting: *Deleted

## 2020-08-08 DIAGNOSIS — E1165 Type 2 diabetes mellitus with hyperglycemia: Secondary | ICD-10-CM

## 2020-08-08 DIAGNOSIS — E1169 Type 2 diabetes mellitus with other specified complication: Secondary | ICD-10-CM

## 2020-08-08 DIAGNOSIS — N1832 Chronic kidney disease, stage 3b: Secondary | ICD-10-CM

## 2020-08-08 DIAGNOSIS — I5032 Chronic diastolic (congestive) heart failure: Secondary | ICD-10-CM

## 2020-08-08 LAB — BASIC METABOLIC PANEL
BUN/Creatinine Ratio: 12 (ref 12–28)
BUN: 18 mg/dL (ref 8–27)
CO2: 25 mmol/L (ref 20–29)
Calcium: 10.8 mg/dL — ABNORMAL HIGH (ref 8.7–10.3)
Chloride: 93 mmol/L — ABNORMAL LOW (ref 96–106)
Creatinine, Ser: 1.52 mg/dL — ABNORMAL HIGH (ref 0.57–1.00)
Glucose: 356 mg/dL — ABNORMAL HIGH (ref 65–99)
Potassium: 4.8 mmol/L (ref 3.5–5.2)
Sodium: 137 mmol/L (ref 134–144)
eGFR: 34 mL/min/{1.73_m2} — ABNORMAL LOW (ref >59)

## 2020-08-08 MED ORDER — FUROSEMIDE 40 MG PO TABS: ORAL_TABLET | ORAL | 2 refills | Status: DC

## 2020-08-19 ENCOUNTER — Other Ambulatory Visit: Payer: Self-pay

## 2020-08-19 DIAGNOSIS — E1169 Type 2 diabetes mellitus with other specified complication: Secondary | ICD-10-CM

## 2020-08-19 DIAGNOSIS — N1832 Chronic kidney disease, stage 3b: Secondary | ICD-10-CM

## 2020-08-19 DIAGNOSIS — E1165 Type 2 diabetes mellitus with hyperglycemia: Secondary | ICD-10-CM

## 2020-08-19 DIAGNOSIS — I5032 Chronic diastolic (congestive) heart failure: Secondary | ICD-10-CM

## 2020-08-23 LAB — BASIC METABOLIC PANEL
BUN/Creatinine Ratio: 16 (ref 12–28)
BUN: 27 mg/dL (ref 8–27)
CO2: 22 mmol/L (ref 20–29)
Calcium: 10.5 mg/dL — ABNORMAL HIGH (ref 8.7–10.3)
Chloride: 90 mmol/L — ABNORMAL LOW (ref 96–106)
Creatinine, Ser: 1.67 mg/dL — ABNORMAL HIGH (ref 0.57–1.00)
Glucose: 430 mg/dL — ABNORMAL HIGH (ref 65–99)
Potassium: 4.6 mmol/L (ref 3.5–5.2)
Sodium: 129 mmol/L — ABNORMAL LOW (ref 134–144)
eGFR: 30 mL/min/{1.73_m2} — ABNORMAL LOW (ref >59)

## 2020-08-24 ENCOUNTER — Emergency Department (HOSPITAL_COMMUNITY)
Admission: EM | Admit: 2020-08-24 | Discharge: 2020-08-24 | Disposition: A | Discharge status: Home / Self Care | Payer: Medicare Other | Attending: Emergency Medicine | Admitting: Emergency Medicine

## 2020-08-24 ENCOUNTER — Emergency Department (HOSPITAL_COMMUNITY): Payer: Medicare Other

## 2020-08-24 ENCOUNTER — Other Ambulatory Visit: Payer: Self-pay

## 2020-08-24 ENCOUNTER — Encounter (HOSPITAL_COMMUNITY): Payer: Self-pay | Admitting: Emergency Medicine

## 2020-08-24 DIAGNOSIS — N3001 Acute cystitis with hematuria: Secondary | ICD-10-CM | POA: Insufficient documentation

## 2020-08-24 DIAGNOSIS — Z87891 Personal history of nicotine dependence: Secondary | ICD-10-CM | POA: Diagnosis not present

## 2020-08-24 DIAGNOSIS — Z794 Long term (current) use of insulin: Secondary | ICD-10-CM | POA: Diagnosis not present

## 2020-08-24 DIAGNOSIS — E119 Type 2 diabetes mellitus without complications: Secondary | ICD-10-CM | POA: Diagnosis not present

## 2020-08-24 DIAGNOSIS — Z7984 Long term (current) use of oral hypoglycemic drugs: Secondary | ICD-10-CM | POA: Diagnosis not present

## 2020-08-24 DIAGNOSIS — Z955 Presence of coronary angioplasty implant and graft: Secondary | ICD-10-CM | POA: Diagnosis not present

## 2020-08-24 DIAGNOSIS — I129 Hypertensive chronic kidney disease with stage 1 through stage 4 chronic kidney disease, or unspecified chronic kidney disease: Secondary | ICD-10-CM | POA: Diagnosis not present

## 2020-08-24 DIAGNOSIS — Z79899 Other long term (current) drug therapy: Secondary | ICD-10-CM | POA: Insufficient documentation

## 2020-08-24 DIAGNOSIS — N189 Chronic kidney disease, unspecified: Secondary | ICD-10-CM | POA: Insufficient documentation

## 2020-08-24 DIAGNOSIS — Z7982 Long term (current) use of aspirin: Secondary | ICD-10-CM | POA: Insufficient documentation

## 2020-08-24 DIAGNOSIS — R300 Dysuria: Secondary | ICD-10-CM | POA: Diagnosis present

## 2020-08-24 LAB — URINALYSIS, ROUTINE W REFLEX MICROSCOPIC
Bilirubin Urine: NEGATIVE
Glucose, UA: >=500 mg/dL — AB
Hgb urine dipstick: NEGATIVE
Ketones, ur: NEGATIVE mg/dL
Nitrite: NEGATIVE
Protein, ur: NEGATIVE mg/dL
Specific Gravity, Urine: 1.017 (ref 1.005–1.030)
pH: 5 (ref 5.0–8.0)

## 2020-08-24 MED ORDER — HYDROCODONE-ACETAMINOPHEN 5-325 MG PO TABS: 1.0000 | ORAL_TABLET | Freq: Four times a day (QID) | ORAL | 0 refills | Status: DC | PRN

## 2020-08-24 MED ORDER — CIPROFLOXACIN HCL 500 MG PO TABS: 500.0000 mg | ORAL_TABLET | Freq: Two times a day (BID) | ORAL | 0 refills | Status: DC

## 2020-08-24 NOTE — ED Provider Notes (Signed)
[facility] Provider Note   CSN: [id number] Arrival date & time: 08/24/20  1658     History No chief complaint on file.   Stephanie [name] is a 85 y.o. female.  Patient complains of back pain and dysuria.  She has been treated for UTI recently  The history is provided by the patient and medical records. No language interpreter was used.  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 Burt Knack and Dr. Cyndia Bent.   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   Asthma 04/08/2020   DM (diabetes mellitus) ([place]) 04/08/2020   Severe aortic stenosis 04/08/2020   Symptomatic anemia 02/09/2019   Vitamin B12 deficiency    Uncontrolled type 2 diabetes mellitus with hyperglycemia ([place]) 07/06/2017   Essential hypertension, benign 07/06/2017   Mixed hyperlipidemia 07/06/2017   Class 2 severe obesity due to excess calories with serious comorbidity and body mass index (BMI) of 36.0 to 36.9 in adult ([place]) 07/06/2017   Medial meniscus, posterior horn derangement 04/12/2012   OA (osteoarthritis) of knee 04/12/2012    Past Surgical History:  Procedure Laterality Date    APPENDECTOMY     BACK SURGERY     CYST REMOVAL HAND     Right, hospital in [place] ECHOCARDIOGRAM Left 07/01/2020   Procedure: INTRAOPERATIVE TRANSTHORACIC ECHOCARDIOGRAM;  Surgeon: Sherren Mocha, MD;  Location: [facility];  Service: Open Heart Surgery;  Laterality: Left;   KNEE ARTHROSCOPY WITH MEDIAL MENISECTOMY Right 02/28/2012   Procedure: KNEE ARTHROSCOPY WITH MEDIAL MENISECTOMY;  Surgeon: Carole Civil, MD;  Location: [facility];  Service: Orthopedics;  Laterality: Right;   REPAIR KNEE LIGAMENT     RIGHT/LEFT HEART CATH AND CORONARY ANGIOGRAPHY N/A 04/10/2020   Procedure: RIGHT/LEFT HEART CATH AND CORONARY ANGIOGRAPHY;  Surgeon: Jettie Booze, MD;  Location: [facility];  Service: Cardiovascular;  Laterality: N/A;   TRANSCATHETER AORTIC VALVE REPLACEMENT, TRANSFEMORAL N/A 07/01/2020   Procedure: TRANSCATHETER AORTIC VALVE REPLACEMENT, TRANSFEMORAL;  Surgeon: Sherren Mocha, MD;  Location: [facility];  Service: Open Heart Surgery;  Laterality: N/A;   ULTRASOUND GUIDANCE FOR VASCULAR ACCESS Bilateral 07/01/2020   Procedure: ULTRASOUND GUIDANCE FOR VASCULAR ACCESS;  Surgeon: Sherren Mocha, MD;  Location: [facility];  Service: Open Heart Surgery;  Laterality: Bilateral;     OB History     Gravida  3   Para  3   Term  3   Preterm      AB      Living         SAB  IAB      Ectopic      Multiple      Live Births              Family History  Problem Relation Age of Onset   Lung disease Other    Cancer Other    Arthritis Other    Asthma Other    Diabetes Other    Diabetes Mother    Hypertension Mother    Diverticulitis Mother    Diabetes Father    Hypertension Sister    Diabetes Brother    Colon cancer Neg Hx     Social History   Tobacco Use   Smoking status: Former    Years: 7.00    Types: Cigarettes   Smokeless tobacco: Never  Vaping Use   Vaping Use: Never used  Substance Use Topics   Alcohol use: No   Drug use:  No    Home Medications Prior to Admission medications   Medication Sig Start Date End Date Taking? Authorizing Provider  ciprofloxacin (CIPRO) 500 MG tablet Take 1 tablet (500 mg total) by mouth 2 (two) times daily. One po bid x 7 days 08/24/20  Yes Milton Ferguson, MD  HYDROcodone-acetaminophen (NORCO/VICODIN) 5-325 MG tablet Take 1 tablet by mouth every 6 (six) hours as needed. 08/24/20  Yes Milton Ferguson, MD  albuterol (PROVENTIL) (2.5 MG/3ML) 0.083% nebulizer solution Take 2.5 mg by nebulization every 6 (six) hours as needed for wheezing or shortness of breath.  10/20/17   [provider]  allopurinol (ZYLOPRIM) 300 MG tablet Take 300 mg by mouth daily. 06/24/20   [provider]  amLODipine (NORVASC) 5 MG tablet Take 1 tablet (5 mg total) by mouth daily. 04/24/20   Strader, Fransisco Hertz, PA-C  aspirin 81 MG chewable tablet Chew 1 tablet (81 mg total) by mouth daily. 07/02/20   Eileen Stanford, PA-C  atorvastatin (LIPITOR) 20 MG tablet Take 20 mg by mouth every evening.     [provider]  azithromycin (ZITHROMAX) 500 MG tablet Take a directed Patient not taking: Reported on 08/07/2020 07/17/20   Eileen Stanford, PA-C  B-D UF III MINI PEN NEEDLES 31G X 5 MM MISC SMARTSIG:1 Each SUB-Q Daily 04/09/20   [provider]  carvedilol (COREG) 3.125 MG tablet Take 1 tablet (3.125 mg total) by mouth 2 (two) times daily. 08/07/20 11/05/20  Eileen Stanford, PA-C  clopidogrel (PLAVIX) 75 MG tablet Take 1 tablet (75 mg total) by mouth daily with breakfast. 07/02/20   Eileen Stanford, PA-C  furosemide (LASIX) 40 MG tablet Take 40 mg qod alt with 20 mg qod 08/08/20   Eileen Stanford, PA-C  gabapentin (NEURONTIN) 100 MG capsule Take 100-200 mg by mouth at bedtime. 10/02/19   [provider]  hydrALAZINE (APRESOLINE) 50 MG tablet Take 1 tablet (50 mg total) by mouth 3 (three) times daily. 07/17/20   Eileen Stanford, PA-C  Insulin Detemir (LEVEMIR FLEXTOUCH)  100 UNIT/ML Pen Inject 30 Units into the skin at bedtime. 02/10/19   Johnson, Clanford L, MD  loratadine (CLARITIN) 10 MG tablet Take 10 mg by mouth daily. 06/23/20   [provider]  metFORMIN (GLUCOPHAGE) 500 MG tablet Take 1 tablet (500 mg total) by mouth 2 (two) times daily. 07/02/20   Eileen Stanford, PA-C  ULTICARE MINI PEN NEEDLES 31G X 6 MM MISC  04/10/20   [provider]    Allergies    Amoxicillin, Macrobid [nitrofurantoin], Penicillins, and Sulfa  antibiotics  Review of Systems   Review of Systems  Constitutional:  Negative for appetite change and fatigue.  HENT:  Negative for congestion, ear discharge and sinus pressure.   Eyes:  Negative for discharge.  Respiratory:  Negative for cough.   Cardiovascular:  Negative for chest pain.  Gastrointestinal:  Negative for abdominal pain and diarrhea.  Genitourinary:  Negative for frequency and hematuria.  Musculoskeletal:  Positive for back pain.  Skin:  Negative for rash.  Neurological:  Negative for seizures and headaches.  Psychiatric/Behavioral:  Negative for hallucinations.    Physical Exam Updated Vital Signs BP (!) 142/76 (BP Location: Right Arm)   Pulse 70   Temp 98.3 F (36.8 C) (Oral)   Resp 18   Ht 5\' 5"  (1.651 m)   Wt 90.7 kg   SpO2 96%   BMI 33.28 kg/m   Physical Exam Vitals and nursing note reviewed.  Constitutional:      Appearance: She is well-developed.  HENT:     Head: Normocephalic.     Nose: Nose normal.  Eyes:     General: No scleral icterus.    Conjunctiva/sclera: Conjunctivae normal.  Neck:     Thyroid: No thyromegaly.  Cardiovascular:     Rate and Rhythm: Normal rate and regular rhythm.     Heart sounds: No murmur heard.   No friction rub. No gallop.  Pulmonary:     Breath sounds: No stridor. No wheezing or rales.  Chest:     Chest wall: No tenderness.  Abdominal:     General: There is no distension.     Tenderness: There is no abdominal tenderness. There is no  rebound.  Musculoskeletal:     Cervical back: Neck supple.     Comments: Tenderness to the lumbar muscle  Lymphadenopathy:     Cervical: No cervical adenopathy.  Skin:    Findings: No erythema or rash.  Neurological:     Mental Status: She is alert and oriented to person, place, and time.     Motor: No abnormal muscle tone.     Coordination: Coordination normal.  Psychiatric:        Behavior: Behavior normal.    ED Results / Procedures / Treatments   Labs (all labs ordered are listed, but only abnormal results are displayed) Labs Reviewed  URINALYSIS, ROUTINE W REFLEX MICROSCOPIC - Abnormal; Notable for the following components:      Result Value   APPearance HAZY (*)    Glucose, UA >=500 (*)    Leukocytes,Ua LARGE (*)    Bacteria, UA RARE (*)    Non Squamous Epithelial 0-5 (*)    All other components within normal limits  URINE CULTURE    EKG None  Radiology DG Lumbar Spine 2-3 Views  Result Date: 08/24/2020 CLINICAL DATA:  Low back pain.  No known injury. EXAM: LUMBAR SPINE - 2-3 VIEW COMPARISON:  CT 04/25/2020 FINDINGS: Prior posterior fusion from L2-S1. Degenerative disc disease at L1-2 with vacuum disc, spurring and endplate sclerosis, stable. No malalignment. No fracture. No hardware complicating feature. SI joints unremarkable. IMPRESSION: Prior posterior fusion from L2-S1. Degenerative disc disease at L1-2. No acute bony abnormality. Electronically Signed   By: Rolm Baptise M.D.   On: 08/24/2020 18:50    Procedures Procedures   Medications Ordered in ED Medications - No data to display  ED Course  I have reviewed the triage vital signs and the nursing notes.  Pertinent labs & imaging results that were available during  my care of the patient were reviewed by me and considered in my medical decision making (see chart for details).    MDM Rules/Calculators/A&P                           Patient with urinary tract infection she is placed on Cipro because she  is allergic to numerous medicines.  She is also told to follow-up with her neurosurgeon for her back pain Final Clinical Impression(s) / ED Diagnoses Final diagnoses:  Acute cystitis with hematuria    Rx / DC Orders ED Discharge Orders          Ordered    ciprofloxacin (CIPRO) 500 MG tablet  2 times daily        08/24/20 2134    HYDROcodone-acetaminophen (NORCO/VICODIN) 5-325 MG tablet  Every 6 hours PRN        08/24/20 2134             Milton Ferguson, MD 08/31/20 1015

## 2020-08-24 NOTE — ED Triage Notes (Signed)
Pt c/o generalized back pain. Pt denies injury but reports just finishing round of abx on 8/4 for UTI.

## 2020-08-24 NOTE — Discharge Instructions (Addendum)
Follow-up with your neurosurgeon for your back pain and follow-up with your family doctor for your bladder infection

## 2020-08-25 ENCOUNTER — Other Ambulatory Visit (HOSPITAL_COMMUNITY): Payer: Medicare HMO

## 2020-08-25 ENCOUNTER — Other Ambulatory Visit: Payer: Self-pay | Admitting: *Deleted

## 2020-08-25 DIAGNOSIS — N1832 Chronic kidney disease, stage 3b: Secondary | ICD-10-CM

## 2020-08-25 DIAGNOSIS — E1169 Type 2 diabetes mellitus with other specified complication: Secondary | ICD-10-CM

## 2020-08-25 DIAGNOSIS — I10 Essential (primary) hypertension: Secondary | ICD-10-CM

## 2020-08-25 MED ORDER — FUROSEMIDE 40 MG PO TABS: 20.0000 mg | ORAL_TABLET | Freq: Every day | ORAL | Status: DC

## 2020-08-26 ENCOUNTER — Telehealth: Payer: Self-pay | Admitting: Physician Assistant

## 2020-08-26 ENCOUNTER — Other Ambulatory Visit: Payer: Self-pay | Admitting: *Deleted

## 2020-08-26 LAB — URINE CULTURE

## 2020-08-26 NOTE — Telephone Encounter (Signed)
Pt's daughter aware to contact PCP re UTI and also pt was notified of lab results per Bonney Leitz PA Creat still elevated, even a bit higher. Lets decrease Lasix to 20mg  daily andrecheck in 1.5 weeks. Pt 's daughter aware as well of results /cy

## 2020-08-26 NOTE — Telephone Encounter (Signed)
Pt c/o medication issue:  1. Name of Medication:  furosemide (LASIX) 40 MG tablet  2. How are you currently taking this medication (dosage and times per day)?   3. Are you having a reaction (difficulty breathing--STAT)?  No   4. What is your medication issue?   Patient's daughter is requesting to speak with clinical staff about the patient stopping Lasix. She states the patient was told to discontinue due to her kidney function. She states the patient has also been having frequent UTI's and she would like to discuss further.

## 2020-09-03 ENCOUNTER — Encounter (HOSPITAL_COMMUNITY): Payer: Self-pay | Admitting: Emergency Medicine

## 2020-09-03 ENCOUNTER — Emergency Department (HOSPITAL_COMMUNITY)
Admission: EM | Admit: 2020-09-03 | Discharge: 2020-09-03 | Disposition: A | Discharge status: Home / Self Care | Payer: Medicare Other | Attending: Emergency Medicine | Admitting: Emergency Medicine

## 2020-09-03 ENCOUNTER — Emergency Department (HOSPITAL_COMMUNITY): Payer: Medicare Other

## 2020-09-03 ENCOUNTER — Other Ambulatory Visit: Payer: Self-pay

## 2020-09-03 DIAGNOSIS — Z5321 Procedure and treatment not carried out due to patient leaving prior to being seen by health care provider: Secondary | ICD-10-CM | POA: Insufficient documentation

## 2020-09-03 DIAGNOSIS — R0602 Shortness of breath: Secondary | ICD-10-CM | POA: Diagnosis not present

## 2020-09-03 LAB — CBC WITH DIFFERENTIAL/PLATELET
Abs Immature Granulocytes: 0.05 10*3/uL (ref 0.00–0.07)
Basophils Absolute: 0 10*3/uL (ref 0.0–0.1)
Basophils Relative: 0 %
Eosinophils Absolute: 0.3 10*3/uL (ref 0.0–0.5)
Eosinophils Relative: 2 %
HCT: 34.6 % — ABNORMAL LOW (ref 36.0–46.0)
Hemoglobin: 10.6 g/dL — ABNORMAL LOW (ref 12.0–15.0)
Immature Granulocytes: 1 %
Lymphocytes Relative: 10 %
Lymphs Abs: 1.1 10*3/uL (ref 0.7–4.0)
MCH: 24.9 pg — ABNORMAL LOW (ref 26.0–34.0)
MCHC: 30.6 g/dL (ref 30.0–36.0)
MCV: 81.4 fL (ref 80.0–100.0)
Monocytes Absolute: 0.9 10*3/uL (ref 0.1–1.0)
Monocytes Relative: 9 %
Neutro Abs: 8.4 10*3/uL — ABNORMAL HIGH (ref 1.7–7.7)
Neutrophils Relative %: 78 %
Platelets: 243 10*3/uL (ref 150–400)
RBC: 4.25 MIL/uL (ref 3.87–5.11)
RDW: 16.5 % — ABNORMAL HIGH (ref 11.5–15.5)
WBC: 10.8 10*3/uL — ABNORMAL HIGH (ref 4.0–10.5)
nRBC: 0 % (ref 0.0–0.2)

## 2020-09-03 LAB — BASIC METABOLIC PANEL
Anion gap: 6 (ref 5–15)
BUN: 19 mg/dL (ref 8–23)
CO2: 23 mmol/L (ref 22–32)
Calcium: 9.6 mg/dL (ref 8.9–10.3)
Chloride: 103 mmol/L (ref 98–111)
Creatinine, Ser: 1.27 mg/dL — ABNORMAL HIGH (ref 0.44–1.00)
GFR, Estimated: 42 mL/min — ABNORMAL LOW (ref >60)
Glucose, Bld: 320 mg/dL — ABNORMAL HIGH (ref 70–99)
Potassium: 4.5 mmol/L (ref 3.5–5.1)
Sodium: 132 mmol/L — ABNORMAL LOW (ref 135–145)

## 2020-09-03 LAB — BRAIN NATRIURETIC PEPTIDE: B Natriuretic Peptide: 405 pg/mL — ABNORMAL HIGH (ref 0.0–100.0)

## 2020-09-03 NOTE — ED Provider Notes (Signed)
Emergency Medicine Provider Triage Evaluation Note  Stephanie [name] , a 85 y.o. female  was evaluated in triage.  Pt complains of shortness of breath.  Started last night, its been worse throughout the day.  She has history of asthma, has tried a nebulizer treatment last night and this morning with minimal relief.  She is not having any chest pain or chest tightness..  Review of Systems  Positive: SOB Negative: CP  Physical Exam  BP (!) 161/82 (BP Location: Right Arm)   Pulse 87   Temp 98.6 F (37 C) (Oral)   Resp 18   Ht 5\' 5"  (1.651 m)   Wt 90.7 kg   SpO2 95%   BMI 33.28 kg/m  Gen:   Awake, no distress   Resp:  Normal effort patient is wheezing in the exam. MSK:   Moves extremities without difficulty  Other:    Medical Decision Making  Medically screening exam initiated at 5:09 PM.  Appropriate orders placed.  Stephanie [name] was informed that the remainder of the evaluation will be completed by another provider, this initial triage assessment does not replace that evaluation, and the importance of remaining in the ED until their evaluation is complete.     Sherrill Raring, PA-C 09/03/20 1710    Fredia Sorrow, MD 09/05/20 2314

## 2020-09-03 NOTE — ED Triage Notes (Signed)
Pt c/o worsening SHOB starting last night. Denies any relieving factors.

## 2020-09-04 ENCOUNTER — Other Ambulatory Visit: Payer: Medicare Other

## 2020-10-21 ENCOUNTER — Telehealth: Payer: Self-pay | Admitting: Student

## 2020-10-21 ENCOUNTER — Other Ambulatory Visit: Payer: Self-pay | Admitting: Physician Assistant

## 2020-10-21 MED ORDER — HYDRALAZINE HCL 50 MG PO TABS: 50.0000 mg | ORAL_TABLET | Freq: Three times a day (TID) | ORAL | 3 refills | Status: DC

## 2020-10-21 NOTE — Telephone Encounter (Signed)
Pharmacy needs clarification on medication :   Patient states she was told to take medication 2 tablets 3x a day   hydrALAZINE (APRESOLINE) 50 MG tablet Take 1 tablet (50 mg total) by mouth 3 (three) times daily.

## 2020-10-21 NOTE — Telephone Encounter (Signed)
Spoke with Kenney Houseman in the pharmacy. Pt called to have Hydralazine refilled d/t to running out. Pharmacy did not have current script on file as Hydralazine 50 mg TID. Refill sent in to pharmacy as Hydralazine 50 mg TID.

## 2020-10-22 ENCOUNTER — Other Ambulatory Visit: Payer: Medicare Other

## 2020-11-03 ENCOUNTER — Inpatient Hospital Stay: Admission: RE | Admit: 2020-11-03 | Payer: Medicare Other | Source: Ambulatory Visit

## 2020-11-05 ENCOUNTER — Other Ambulatory Visit (INDEPENDENT_AMBULATORY_CARE_PROVIDER_SITE_OTHER): Payer: Self-pay | Admitting: Podiatry

## 2020-11-05 DIAGNOSIS — M79672 Pain in left foot: Secondary | ICD-10-CM

## 2020-11-05 DIAGNOSIS — M79671 Pain in right foot: Secondary | ICD-10-CM

## 2020-11-06 ENCOUNTER — Other Ambulatory Visit: Payer: Medicare Other

## 2020-11-07 ENCOUNTER — Ambulatory Visit (INDEPENDENT_AMBULATORY_CARE_PROVIDER_SITE_OTHER): Payer: Medicare Other

## 2020-11-07 ENCOUNTER — Ambulatory Visit (INDEPENDENT_AMBULATORY_CARE_PROVIDER_SITE_OTHER): Payer: Medicare Other | Admitting: Nurse Practitioner

## 2020-11-07 ENCOUNTER — Encounter (INDEPENDENT_AMBULATORY_CARE_PROVIDER_SITE_OTHER): Payer: Self-pay | Admitting: Nurse Practitioner

## 2020-11-07 ENCOUNTER — Other Ambulatory Visit: Payer: Self-pay

## 2020-11-07 VITALS — BP 176/51 | HR 90 | Resp 16 | Wt 198.8 lb

## 2020-11-07 DIAGNOSIS — I739 Peripheral vascular disease, unspecified: Secondary | ICD-10-CM | POA: Diagnosis not present

## 2020-11-07 DIAGNOSIS — M79672 Pain in left foot: Secondary | ICD-10-CM | POA: Diagnosis not present

## 2020-11-07 DIAGNOSIS — M79671 Pain in right foot: Secondary | ICD-10-CM

## 2020-11-07 DIAGNOSIS — E782 Mixed hyperlipidemia: Secondary | ICD-10-CM | POA: Diagnosis not present

## 2020-11-07 DIAGNOSIS — I10 Essential (primary) hypertension: Secondary | ICD-10-CM

## 2020-11-07 DIAGNOSIS — E1165 Type 2 diabetes mellitus with hyperglycemia: Secondary | ICD-10-CM

## 2020-11-08 ENCOUNTER — Encounter (INDEPENDENT_AMBULATORY_CARE_PROVIDER_SITE_OTHER): Payer: Self-pay | Admitting: Nurse Practitioner

## 2020-11-08 NOTE — Progress Notes (Signed)
Subjective:    Patient ID: Stephanie [name], female    DOB: 13-Sep-1935, 85 y.o.   MRN: [id number] Chief Complaint  Patient presents with   New Patient (Initial Visit)    Ref Vickki Muff bil foot pain,non-palpable pulses    Stephanie [name] is a 85 year old female that is referred by Dr. Vickki Muff for concern for possible peripheral arterial disease due to bilateral foot pain.  The patient notes that she has pain at the tops of her feet that she describes as an aching sensation.  She notes that this is been ongoing for several months.  It has happened ever since she had her TAVR surgery.  The pain tends to be worse when she is in bed at night.  She denies any claudication-like symptoms.  She has no rest pain or open wounds or ulcerations.  Today the patient has a right ABI 0.81 with a left of 0.79.  The TBI's bilaterally are 0.43.  The patient also had bilateral lower extremity arterial duplexes.  The right lower extremity has biphasic/triphasic waveforms down to the level of the distal posterior tibial where it transitions to monophasic waveforms in the tibial arteries.  The left lower extremity also has biphasic/triphasic waveforms with transitions to monophasic waveforms in the tibial arteries.  The patient does have some dampened toe waveforms.   Review of Systems  Cardiovascular:  Negative for leg swelling.  Musculoskeletal:  Positive for gait problem and myalgias.  All other systems reviewed and are negative.     Objective:   Physical Exam Vitals reviewed.  HENT:     Head: Normocephalic.  Cardiovascular:     Rate and Rhythm: Normal rate.     Pulses:          Dorsalis pedis pulses are detected w/ Doppler on the right side and detected w/ Doppler on the left side.       Posterior tibial pulses are detected w/ Doppler on the left side.  Pulmonary:     Effort: Pulmonary effort is normal.  Skin:    Capillary Refill: Capillary refill takes 2 to 3 seconds.  Neurological:     Mental Status:  She is alert and oriented to person, place, and time.  Psychiatric:        Mood and Affect: Mood normal.        Behavior: Behavior normal.        Thought Content: Thought content normal.        Judgment: Judgment normal.    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 Burt Knack and Dr. Cyndia Bent.   Severe aortic stenosis    Symptomatic anemia    Presumed GI bleed January 2021 - she declined GI work-up   Type 2 diabetes mellitus ([place])     Social History   Socioeconomic History   Marital status: Divorced    Spouse name: Not on file   Number of children: 3   Years of education: Not on file   Highest education level: Not on file  Occupational History  Occupation: Retired-cook   Tobacco Use   Smoking status: Former    Years: 7.00    Types: Cigarettes   Smokeless tobacco: Never  Vaping Use   Vaping Use: Never used  Substance and Sexual Activity   Alcohol use: No   Drug use: No   Sexual activity: Never  Other Topics Concern   Not on file  Social History Narrative   ** Merged History Encounter **       Social Determinants of Health   Financial Resource Strain: Not on file  Food Insecurity: Not on file  Transportation Needs: Not on file  Physical Activity: Not on file  Stress: Not on file  Social Connections: Not on file  Intimate Partner Violence: Not on file    Past Surgical History:  Procedure Laterality Date   APPENDECTOMY     BACK SURGERY     CYST REMOVAL HAND     Right, hospital in [place] ECHOCARDIOGRAM Left 07/01/2020   Procedure: INTRAOPERATIVE TRANSTHORACIC ECHOCARDIOGRAM;  Surgeon: Sherren Mocha, MD;  Location: [facility];  Service: Open Heart Surgery;  Laterality: Left;   KNEE ARTHROSCOPY WITH MEDIAL MENISECTOMY Right 02/28/2012   Procedure: KNEE ARTHROSCOPY WITH MEDIAL MENISECTOMY;  Surgeon: Carole Civil, MD;  [facility];  Service: Orthopedics;  Laterality: Right;   REPAIR KNEE LIGAMENT     RIGHT/LEFT HEART CATH AND CORONARY ANGIOGRAPHY N/A 04/10/2020   Procedure: RIGHT/LEFT HEART CATH AND CORONARY ANGIOGRAPHY;  Surgeon: Jettie Booze, MD;  Location: [facility];  Service: Cardiovascular;  Laterality: N/A;   TRANSCATHETER AORTIC VALVE REPLACEMENT, TRANSFEMORAL N/A 07/01/2020   Procedure: TRANSCATHETER AORTIC VALVE REPLACEMENT, TRANSFEMORAL;  Surgeon: Sherren Mocha, MD;  Location: [facility];  Service: Open Heart Surgery;  Laterality: N/A;   ULTRASOUND GUIDANCE FOR VASCULAR ACCESS Bilateral 07/01/2020   Procedure: ULTRASOUND GUIDANCE FOR VASCULAR ACCESS;  Surgeon: Sherren Mocha, MD;  Location: [facility];  Service: Open Heart Surgery;  Laterality: Bilateral;    Family History  Problem Relation Age of Onset   Lung disease Other    Cancer Other    Arthritis Other    Asthma Other    Diabetes Other    Diabetes Mother    Hypertension Mother    Diverticulitis Mother    Diabetes Father    Hypertension Sister    Diabetes Brother    Colon cancer Neg Hx     Allergies  Allergen Reactions   Amoxicillin Swelling    Tongue swelling    Macrobid [Nitrofurantoin] Nausea And Vomiting   Penicillins Swelling    Has patient had a PCN reaction causing immediate rash, facial/tongue/throat swelling, SOB or lightheadedness with hypotension: No Has patient had a PCN reaction causing severe rash involving mucus membranes or skin necrosis: No Has patient had a PCN reaction that required hospitalization: No Has patient had a PCN reaction occurring within the last 10 years: No If all of the above answers are "NO", then may proceed with Cephalosporin use.    Sulfa Antibiotics  Swelling    Lip swelling    CBC Latest Ref Rng & Units 09/03/2020 07/02/2020 07/01/2020  WBC 4.0 - 10.5 K/uL 10.8(H) 12.2(H) -  Hemoglobin 12.0 - 15.0 g/dL 10.6(L) 11.2(L) 9.9(L)  Hematocrit 36.0 - 46.0 % 34.6(L) 36.4 29.0(L)  Platelets 150 - 400 K/uL 243 220 -      CMP     Component Value Date/Time   NA 132 (L) 09/03/2020 1721   NA 129 (L) 08/22/2020  1119   K 4.5 09/03/2020 1721   CL 103 09/03/2020 1721   CO2 23 09/03/2020 1721   GLUCOSE 320 (H) 09/03/2020 1721   BUN 19 09/03/2020 1721   BUN 27 08/22/2020 1119   CREATININE 1.27 (H) 09/03/2020 1721   CALCIUM 9.6 09/03/2020 1721   PROT 7.0 06/27/2020 1008   ALBUMIN 3.7 06/27/2020 1008   AST 20 06/27/2020 1008   ALT 15 06/27/2020 1008   ALKPHOS 153 (H) 06/27/2020 1008   BILITOT 0.6 06/27/2020 1008   GFRNONAA 42 (L) 09/03/2020 1721   GFRAA 49 (L) 02/10/2019 0610     No results found.     Assessment & Plan:   1. Essential hypertension, benign Continue antihypertensive medications as already ordered, these medications have been reviewed and there are no changes at this time.   2. Mixed hyperlipidemia Continue statin as ordered and reviewed, no changes at this time   3. Uncontrolled type 2 diabetes mellitus with hyperglycemia ([place]) Continue hypoglycemic medications as already ordered, these medications have been reviewed and there are no changes at this time.  Hgb A1C to be monitored as already arranged by primary service   4. PAD (peripheral artery disease) ([place]) Had a long discussion with the patient regarding the pathophysiology of peripheral arterial disease.  The patient does have evidence of what appears to be tibial level disease.  We discussed angiogram with the patient as well as the risk benefits and alternatives.  Currently the patient does not have any ischemic symptoms and she is very adamant against doing any sort of invasive procedure at this time.  Because of this we will continue to follow and monitor on  a close conservative basis.  Patient is advised that if she begins to have discoloration at her toes or if she begins to have wounds or ulcerations we will need to see her much sooner and intervention will be absolutely necessary.    Current Outpatient Medications on File Prior to Visit  Medication Sig Dispense Refill   albuterol (PROVENTIL) (2.5 MG/3ML) 0.083% nebulizer solution Take 2.5 mg by nebulization every 6 (six) hours as needed for wheezing or shortness of breath.   2   allopurinol (ZYLOPRIM) 300 MG tablet Take 300 mg by mouth daily.     amLODipine (NORVASC) 5 MG tablet Take 1 tablet (5 mg total) by mouth daily. 90 tablet 3   aspirin 81 MG chewable tablet Chew 1 tablet (81 mg total) by mouth daily. 90 tablet 3   atorvastatin (LIPITOR) 20 MG tablet Take 20 mg by mouth every evening.      B-D UF III MINI PEN NEEDLES 31G X 5 MM MISC SMARTSIG:1 Each SUB-Q Daily     carvedilol (COREG) 3.125 MG tablet Take 1 tablet (3.125 mg total) by mouth 2 (two) times daily. 180 tablet 3   clopidogrel (PLAVIX) 75 MG tablet TAKE 1 TABLET IN THE MORNING WITH FOOD 90 tablet 0   ferrous sulfate 325 (65 FE) MG EC tablet Take 325 mg by mouth daily with breakfast.     furosemide (LASIX) 40 MG tablet Take 0.5 tablets (20 mg total) by mouth daily.     gabapentin (NEURONTIN) 100 MG capsule Take 100-200 mg by mouth at bedtime.     hydrALAZINE (APRESOLINE) 50 MG tablet Take 1 tablet (50 mg total) by mouth 3 (three) times daily. 270 tablet 3   HYDROcodone-acetaminophen (NORCO/VICODIN) 5-325 MG tablet Take 1 tablet by mouth every 6 (six) hours as needed. 20 tablet 0  Insulin Detemir (LEVEMIR FLEXTOUCH) 100 UNIT/ML Pen Inject 30 Units into the skin at bedtime. (Patient taking differently: Inject 40 Units into the skin at bedtime.) 15 mL 11   loratadine (CLARITIN) 10 MG tablet Take 10 mg by mouth daily.     metFORMIN (GLUCOPHAGE) 500 MG tablet Take 1 tablet (500 mg total) by mouth 2 (two) times daily.     metroNIDAZOLE  (FLAGYL) 500 MG tablet Take 500 mg by mouth 2 (two) times daily.     ULTICARE MINI PEN NEEDLES 31G X 6 MM MISC      azithromycin (ZITHROMAX) 500 MG tablet Take a directed (Patient not taking: No sig reported) 6 tablet 3   ciprofloxacin (CIPRO) 500 MG tablet Take 1 tablet (500 mg total) by mouth 2 (two) times daily. One po bid x 7 days (Patient not taking: Reported on 11/07/2020) 14 tablet 0   No current facility-administered medications on file prior to visit.    There are no Patient Instructions on file for this visit. No follow-ups on file.   Kris Hartmann, NP

## 2020-11-12 ENCOUNTER — Inpatient Hospital Stay: Admission: RE | Admit: 2020-11-12 | Payer: Medicare Other | Source: Ambulatory Visit

## 2020-11-17 ENCOUNTER — Other Ambulatory Visit: Payer: Self-pay

## 2020-11-17 ENCOUNTER — Encounter (HOSPITAL_COMMUNITY): Payer: Self-pay

## 2020-11-17 ENCOUNTER — Emergency Department (HOSPITAL_COMMUNITY): Payer: Medicare Other

## 2020-11-17 ENCOUNTER — Emergency Department (HOSPITAL_COMMUNITY)
Admission: EM | Admit: 2020-11-17 | Discharge: 2020-11-17 | Disposition: A | Discharge status: Home / Self Care | Payer: Medicare Other | Attending: Emergency Medicine | Admitting: Emergency Medicine

## 2020-11-17 DIAGNOSIS — N189 Chronic kidney disease, unspecified: Secondary | ICD-10-CM | POA: Insufficient documentation

## 2020-11-17 DIAGNOSIS — I129 Hypertensive chronic kidney disease with stage 1 through stage 4 chronic kidney disease, or unspecified chronic kidney disease: Secondary | ICD-10-CM | POA: Insufficient documentation

## 2020-11-17 DIAGNOSIS — Z87891 Personal history of nicotine dependence: Secondary | ICD-10-CM | POA: Insufficient documentation

## 2020-11-17 DIAGNOSIS — E875 Hyperkalemia: Secondary | ICD-10-CM | POA: Diagnosis not present

## 2020-11-17 DIAGNOSIS — R0602 Shortness of breath: Secondary | ICD-10-CM | POA: Insufficient documentation

## 2020-11-17 DIAGNOSIS — E1122 Type 2 diabetes mellitus with diabetic chronic kidney disease: Secondary | ICD-10-CM | POA: Diagnosis not present

## 2020-11-17 DIAGNOSIS — J45909 Unspecified asthma, uncomplicated: Secondary | ICD-10-CM | POA: Insufficient documentation

## 2020-11-17 DIAGNOSIS — Z20822 Contact with and (suspected) exposure to covid-19: Secondary | ICD-10-CM | POA: Diagnosis not present

## 2020-11-17 DIAGNOSIS — R062 Wheezing: Secondary | ICD-10-CM | POA: Insufficient documentation

## 2020-11-17 LAB — RESP PANEL BY RT-PCR (FLU A&B, COVID) ARPGX2
Influenza A by PCR: NEGATIVE
Influenza B by PCR: NEGATIVE
SARS Coronavirus 2 by RT PCR: NEGATIVE

## 2020-11-17 LAB — COMPREHENSIVE METABOLIC PANEL
ALT: 12 U/L (ref 0–44)
AST: 13 U/L — ABNORMAL LOW (ref 15–41)
Albumin: 3.5 g/dL (ref 3.5–5.0)
Alkaline Phosphatase: 163 U/L — ABNORMAL HIGH (ref 38–126)
Anion gap: 7 (ref 5–15)
BUN: 24 mg/dL — ABNORMAL HIGH (ref 8–23)
CO2: 29 mmol/L (ref 22–32)
Calcium: 10 mg/dL (ref 8.9–10.3)
Chloride: 98 mmol/L (ref 98–111)
Creatinine, Ser: 1.15 mg/dL — ABNORMAL HIGH (ref 0.44–1.00)
GFR, Estimated: 48 mL/min — ABNORMAL LOW (ref >60)
Glucose, Bld: 323 mg/dL — ABNORMAL HIGH (ref 70–99)
Potassium: 5.4 mmol/L — ABNORMAL HIGH (ref 3.5–5.1)
Sodium: 134 mmol/L — ABNORMAL LOW (ref 135–145)
Total Bilirubin: 0.2 mg/dL — ABNORMAL LOW (ref 0.3–1.2)
Total Protein: 6.6 g/dL (ref 6.5–8.1)

## 2020-11-17 LAB — CBC WITH DIFFERENTIAL/PLATELET
Abs Immature Granulocytes: 0.04 10*3/uL (ref 0.00–0.07)
Basophils Absolute: 0 10*3/uL (ref 0.0–0.1)
Basophils Relative: 0 %
Eosinophils Absolute: 0.3 10*3/uL (ref 0.0–0.5)
Eosinophils Relative: 3 %
HCT: 35.8 % — ABNORMAL LOW (ref 36.0–46.0)
Hemoglobin: 10.8 g/dL — ABNORMAL LOW (ref 12.0–15.0)
Immature Granulocytes: 0 %
Lymphocytes Relative: 12 %
Lymphs Abs: 1.1 10*3/uL (ref 0.7–4.0)
MCH: 23.7 pg — ABNORMAL LOW (ref 26.0–34.0)
MCHC: 30.2 g/dL (ref 30.0–36.0)
MCV: 78.7 fL — ABNORMAL LOW (ref 80.0–100.0)
Monocytes Absolute: 0.6 10*3/uL (ref 0.1–1.0)
Monocytes Relative: 7 %
Neutro Abs: 7.1 10*3/uL (ref 1.7–7.7)
Neutrophils Relative %: 78 %
Platelets: 183 10*3/uL (ref 150–400)
RBC: 4.55 MIL/uL (ref 3.87–5.11)
RDW: 19.8 % — ABNORMAL HIGH (ref 11.5–15.5)
WBC: 9.1 10*3/uL (ref 4.0–10.5)
nRBC: 0 % (ref 0.0–0.2)

## 2020-11-17 LAB — BRAIN NATRIURETIC PEPTIDE: B Natriuretic Peptide: 118 pg/mL — ABNORMAL HIGH (ref 0.0–100.0)

## 2020-11-17 LAB — TROPONIN I (HIGH SENSITIVITY)
Troponin I (High Sensitivity): 19 ng/L — ABNORMAL HIGH (ref <18)
Troponin I (High Sensitivity): 21 ng/L — ABNORMAL HIGH (ref <18)

## 2020-11-17 MED ORDER — ALBUTEROL SULFATE HFA 108 (90 BASE) MCG/ACT IN AERS: 2.0000 | INHALATION_SPRAY | RESPIRATORY_TRACT | Status: DC | PRN

## 2020-11-17 NOTE — ED Triage Notes (Signed)
Pt to the EDV with shortness of breath that began last night when her grand daughter used nail polish.

## 2020-11-17 NOTE — ED Provider Notes (Signed)
Emergency Department Provider Note   I have reviewed the triage vital signs and the nursing notes.   HISTORY  Chief Complaint Shortness of Breath   HPI Stephanie [name] is a 85 y.o. female presents to the ED with SOB and mild wheezing. Symptoms began last night. She notes that her daughter used nail polish and she began to feel symptoms then. No fever or chills. No radiation of symptoms or modifying factors. Notes some wheezing with a history of asthma. No CP. No abdominal pain. Compliant with other home medications. No lasting relief with albuterol at home.    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 Burt Knack and Dr. Cyndia Bent.   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   Asthma 04/08/2020   DM (diabetes mellitus) ([place]) 04/08/2020   Severe aortic stenosis 04/08/2020   Symptomatic anemia 02/09/2019   Vitamin B12 deficiency    Uncontrolled type 2 diabetes mellitus with hyperglycemia ([place]) 07/06/2017   Essential hypertension, benign 07/06/2017   Mixed hyperlipidemia 07/06/2017   Class 2 severe obesity due to excess calories with serious comorbidity and body mass index (BMI) of 36.0 to 36.9 in adult ([place]) 07/06/2017   Medial meniscus, posterior horn derangement 04/12/2012   OA (osteoarthritis) of knee 04/12/2012    Past Surgical History:  Procedure Laterality Date   APPENDECTOMY     BACK SURGERY     CYST REMOVAL HAND     Right, hospital in [place] ECHOCARDIOGRAM Left 07/01/2020   Procedure: INTRAOPERATIVE  TRANSTHORACIC ECHOCARDIOGRAM;  Surgeon: Sherren Mocha, MD;  Location: [facility];  Service: Open Heart Surgery;  Laterality: Left;   KNEE ARTHROSCOPY WITH MEDIAL MENISECTOMY Right 02/28/2012   Procedure: KNEE ARTHROSCOPY WITH MEDIAL MENISECTOMY;  Surgeon: Carole Civil, MD;  Location: [facility];  Service: Orthopedics;  Laterality: Right;   REPAIR KNEE LIGAMENT     RIGHT/LEFT HEART CATH AND CORONARY ANGIOGRAPHY N/A 04/10/2020   Procedure: RIGHT/LEFT HEART CATH AND CORONARY ANGIOGRAPHY;  Surgeon: Jettie Booze, MD;  Location: [facility];  Service: Cardiovascular;  Laterality: N/A;   TRANSCATHETER AORTIC VALVE REPLACEMENT, TRANSFEMORAL N/A 07/01/2020   Procedure: TRANSCATHETER AORTIC VALVE REPLACEMENT, TRANSFEMORAL;  Surgeon: Sherren Mocha, MD;  Location: [facility];  Service: Open Heart Surgery;  Laterality: N/A;   ULTRASOUND GUIDANCE FOR VASCULAR ACCESS Bilateral 07/01/2020   Procedure: ULTRASOUND GUIDANCE FOR VASCULAR ACCESS;  Surgeon: Sherren Mocha, MD;  Location: [facility];  Service: Open Heart Surgery;  Laterality: Bilateral;    Allergies Amoxicillin, Macrobid [nitrofurantoin], Penicillins, and Sulfa antibiotics  Family History  Problem Relation Age of Onset   Lung disease Other    Cancer Other    Arthritis Other    Asthma Other    Diabetes Other    Diabetes Mother    Hypertension Mother    Diverticulitis Mother    Diabetes Father    Hypertension Sister    Diabetes Brother    Colon cancer Neg Hx  Social History Social History   Tobacco Use   Smoking status: Former    Years: 7.00    Types: Cigarettes   Smokeless tobacco: Never  Vaping Use   Vaping Use: Never used  Substance Use Topics   Alcohol use: No   Drug use: No    Review of Systems  Constitutional: No fever/chills Eyes: No visual changes. ENT: No sore throat. Cardiovascular: Denies chest pain. Respiratory: Positive shortness of breath. Gastrointestinal: No abdominal pain.  No nausea, no vomiting.   No diarrhea.  No constipation. Genitourinary: Negative for dysuria. Musculoskeletal: Negative for back pain. Skin: Negative for rash. Neurological: Negative for headaches, focal weakness or numbness.  10-point ROS otherwise negative.  ____________________________________________   PHYSICAL EXAM:  VITAL SIGNS: ED Triage Vitals  Enc Vitals Group     BP 11/17/20 0813 (!) 179/87     Pulse Rate 11/17/20 0813 86     Resp 11/17/20 0813 18     Temp 11/17/20 0818 98.7 F (37.1 C)     Temp Source 11/17/20 0818 Oral     SpO2 11/17/20 0813 97 %     Weight 11/17/20 0818 190 lb (86.2 kg)     Height 11/17/20 0818 5\' 5"  (1.651 m)   Constitutional: Alert and oriented. Well appearing and in no acute distress. Eyes: Conjunctivae are normal.  Head: Atraumatic. Nose: No congestion/rhinnorhea. Mouth/Throat: Mucous membranes are moist.  Neck: No stridor.   Cardiovascular: Normal rate, regular rhythm. Good peripheral circulation. Grossly normal heart sounds.   Respiratory: Normal respiratory effort.  No retractions. Lungs w/ faint end-expiratory wheezing.  Gastrointestinal: Soft and nontender. No distention.  Musculoskeletal: No lower extremity tenderness nor edema. No gross deformities of extremities. Neurologic:  Normal speech and language. No gross focal neurologic deficits are appreciated.  Skin:  Skin is warm, dry and intact. No rash noted.   ____________________________________________   LABS (all labs ordered are listed, but only abnormal results are displayed)  Labs Reviewed  COMPREHENSIVE METABOLIC PANEL - Abnormal; Notable for the following components:      Result Value   Sodium 134 (*)    Potassium 5.4 (*)    Glucose, Bld 323 (*)    BUN 24 (*)    Creatinine, Ser 1.15 (*)    AST 13 (*)    Alkaline Phosphatase 163 (*)    Total Bilirubin 0.2 (*)    GFR, Estimated 48 (*)    All other components within normal limits  BRAIN NATRIURETIC PEPTIDE - Abnormal; Notable for the  following components:   B Natriuretic Peptide 118.0 (*)    All other components within normal limits  CBC WITH DIFFERENTIAL/PLATELET - Abnormal; Notable for the following components:   Hemoglobin 10.8 (*)    HCT 35.8 (*)    MCV 78.7 (*)    MCH 23.7 (*)    RDW 19.8 (*)    All other components within normal limits  TROPONIN I (HIGH SENSITIVITY) - Abnormal; Notable for the following components:   Troponin I (High Sensitivity) 19 (*)    All other components within normal limits  TROPONIN I (HIGH SENSITIVITY) - Abnormal; Notable for the following components:   Troponin I (High Sensitivity) 21 (*)    All other components within normal limits  RESP PANEL BY RT-PCR (FLU A&B, COVID) [id number]   ____________________________________________  EKG   EKG Interpretation  Date/Time:  Monday November 17 2020 09:08:24 EDT Ventricular Rate:  75 PR Interval:  188 QRS Duration: 83 QT Interval:  466  QTC Calculation: 521 R Axis:   -51 Text Interpretation: Sinus rhythm Left anterior fascicular block Anteroseptal infarct, age indeterminate Prolonged QT interval Confirmed by Nanda Quinton [phone]) on 11/17/2020 9:17:02 AM        ____________________________________________  RADIOLOGY  DG Chest Portable 1 View  Result Date: 11/17/2020 CLINICAL DATA:  Shortness of breath EXAM: PORTABLE CHEST 1 VIEW COMPARISON:  09/03/2020 FINDINGS: Cardiomegaly. Aortic valve stent graft. Both lungs are clear. The visualized skeletal structures are unremarkable. IMPRESSION: Cardiomegaly without acute abnormality of the lungs in [facility] portable projection. Electronically Signed   By: Delanna Ahmadi M.D.   On: 11/17/2020 08:43    ____________________________________________   PROCEDURES  Procedure(s) performed:   Procedures  None ____________________________________________   INITIAL IMPRESSION / ASSESSMENT AND PLAN / ED COURSE  Pertinent labs & imaging results that were available during my care of the patient  were reviewed by me and considered in my medical decision making (see chart for details).   Patient presents to the ED with SOB. Likely asthma exacerbation. Considering CAP, CHF exacerbation, and viral process as well. Looks well with no hypoxemia. Faint wheezing on exam.    11:32 AM  Patient feeling much better on reassessment after albuterol. Labs reviewed. Mild hyperkalemia. Shifted incidentally with albuterol here. Patient does not appear volume overloaded. No hypoxemia or increased WOB. Continue albuterol at home PRN. Exam/presentation is not severe. Will hold on steroids as I have concern with age and DM history.   At this time, I do not feel there is any life-threatening condition present. I have reviewed and discussed all results (EKG, imaging, lab, urine as appropriate), exam findings with patient. I have reviewed nursing notes and appropriate previous records.  I feel the patient is safe to be discharged home without further emergent workup. Discussed usual and customary return precautions. Patient and family (if present) verbalize understanding and are comfortable with this plan.  Patient will follow-up with their primary care provider. If they do not have a primary care provider, information for follow-up has been provided to them. All questions have been answered.   ____________________________________________  FINAL CLINICAL IMPRESSION(S) / ED DIAGNOSES  Final diagnoses:  SOB (shortness of breath)      Note:  This document was prepared using Dragon voice recognition software and may include unintentional dictation errors.  Nanda Quinton, MD, [facility]    Cornisha Zetino, [facility], MD 11/23/20 1344

## 2020-11-17 NOTE — Discharge Instructions (Signed)
You were seen in the ED with trouble breathing. Please continue to use your albuterol and other home medications. Please call your PCP today for a follow up appointment this week.

## 2020-11-19 ENCOUNTER — Other Ambulatory Visit: Payer: Self-pay

## 2020-11-19 ENCOUNTER — Ambulatory Visit (INDEPENDENT_AMBULATORY_CARE_PROVIDER_SITE_OTHER)
Admission: RE | Admit: 2020-11-19 | Discharge: 2020-11-19 | Disposition: A | Discharge status: Home / Self Care | Payer: Medicare Other | Source: Ambulatory Visit | Attending: Physician Assistant | Admitting: Physician Assistant

## 2020-11-19 DIAGNOSIS — R918 Other nonspecific abnormal finding of lung field: Secondary | ICD-10-CM | POA: Diagnosis not present

## 2020-11-20 ENCOUNTER — Telehealth: Payer: Self-pay | Admitting: Nurse Practitioner

## 2020-11-20 DIAGNOSIS — R911 Solitary pulmonary nodule: Secondary | ICD-10-CM

## 2020-11-20 NOTE — Telephone Encounter (Signed)
Patient aware and agrees with plan. Referral to pulmonology has been placed and patient is aware she will get a call for an appointment. She thanked me for the call.

## 2020-11-20 NOTE — Telephone Encounter (Signed)
-----   Message from Eileen Stanford, [place] sent at 11/20/2020 12:29 PM EDT ----- Can you let the pt know that her pulm nodule is stable but will need long term follow up. SHe will need a pulmonary nodule clinic with Dr. Valeta Harms and Eric Form NP. Thank you

## 2020-11-21 ENCOUNTER — Ambulatory Visit (HOSPITAL_COMMUNITY): Payer: Medicare Other

## 2020-11-21 ENCOUNTER — Ambulatory Visit (HOSPITAL_COMMUNITY): Payer: Medicare Other | Admitting: Occupational Therapy

## 2020-11-25 ENCOUNTER — Other Ambulatory Visit: Payer: Self-pay

## 2020-11-25 ENCOUNTER — Ambulatory Visit (HOSPITAL_COMMUNITY)
Admission: RE | Admit: 2020-11-25 | Discharge: 2020-11-25 | Disposition: A | Discharge status: Home / Self Care | Payer: Medicare Other | Source: Ambulatory Visit | Attending: Urology | Admitting: Urology

## 2020-11-25 DIAGNOSIS — N2889 Other specified disorders of kidney and ureter: Secondary | ICD-10-CM | POA: Insufficient documentation

## 2020-11-25 MED ORDER — IOHEXOL 300 MG/ML  SOLN
100.0000 mL | Freq: Once | INTRAMUSCULAR | Status: AC | PRN
  Administered 2020-11-25: 80 mL via INTRAVENOUS

## 2020-11-28 ENCOUNTER — Ambulatory Visit (HOSPITAL_COMMUNITY): Payer: Medicare Other | Admitting: Occupational Therapy

## 2020-11-28 ENCOUNTER — Ambulatory Visit: Payer: Medicare Other | Admitting: Urology

## 2020-12-04 ENCOUNTER — Ambulatory Visit (HOSPITAL_COMMUNITY): Payer: Medicare Other | Admitting: Occupational Therapy

## 2020-12-08 ENCOUNTER — Institutional Professional Consult (permissible substitution): Payer: Medicare Other | Admitting: Pulmonary Disease

## 2020-12-09 ENCOUNTER — Ambulatory Visit (INDEPENDENT_AMBULATORY_CARE_PROVIDER_SITE_OTHER): Payer: Medicare Other | Admitting: Cardiology

## 2020-12-09 ENCOUNTER — Encounter: Payer: Self-pay | Admitting: Cardiology

## 2020-12-09 VITALS — BP 128/78 | HR 89 | Resp 18 | Ht 65.0 in | Wt 190.0 lb

## 2020-12-09 DIAGNOSIS — Z952 Presence of prosthetic heart valve: Secondary | ICD-10-CM | POA: Diagnosis not present

## 2020-12-09 DIAGNOSIS — I429 Cardiomyopathy, unspecified: Secondary | ICD-10-CM | POA: Diagnosis not present

## 2020-12-09 DIAGNOSIS — N1832 Chronic kidney disease, stage 3b: Secondary | ICD-10-CM

## 2020-12-09 MED ORDER — CARVEDILOL 6.25 MG PO TABS: 6.2500 mg | ORAL_TABLET | Freq: Two times a day (BID) | ORAL | 3 refills | Status: DC

## 2020-12-09 NOTE — Patient Instructions (Addendum)
Medication Instructions:  Your physician has recommended you make the following change in your medication:  Increase carvedilol to 6.25 mg twice daily Continue other medications the same  Labwork: none  Testing/Procedures: Your physician has requested that you have an echocardiogram in 6 months just before your next visit. Echocardiography is a painless test that uses sound waves to create images of your heart. It provides your doctor with information about the size and shape of your heart and how well your heart's chambers and valves are working. This procedure takes approximately one hour. There are no restrictions for this procedure.  Follow-Up: Your physician recommends that you schedule a follow-up appointment in: 6 months  Any Other Special Instructions Will Be Listed Below (If Applicable).  If you need a refill on your cardiac medications before your next appointment, please call your pharmacy.

## 2020-12-09 NOTE — Progress Notes (Signed)
Cardiology Office Note  Date: 12/09/2020   ID: Stephanie [name], DOB 08-12-1935, MRN [id number]  PCP:  [facility]  Cardiologist:  Rozann Lesches, MD Electrophysiologist:  None   Chief Complaint  Patient presents with   Cardiac follow-up    History of Present Illness: Stephanie [name] is an 85 y.o. female last seen in April by Ms. Strader PA-C.  She has had interval follow-up in the structural heart clinic as well.  She underwent TAVR in June of this year for management of severe aortic stenosis.  Cardiac catheterization in March of this year showed nonobstructive coronary atherosclerosis.  Follow-up echocardiogram in July revealed LVEF 45 to 50% with mild diastolic dysfunction, normal RV contraction, and stable TAVR prosthesis with mean gradient 9 mmHg and no paravalvular regurgitation.  She is here today with family member for a follow-up visit.  Reports NYHA class II dyspnea with typical activities.  No exertional chest pain.  No palpitations.  I reviewed her medications which are noted below.  She reports compliance with therapy.  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 Burt Knack and Dr. Cyndia Bent.   Severe aortic stenosis    Symptomatic anemia    Presumed GI bleed January 2021 - she declined GI work-up   Type 2 diabetes mellitus ([place])     Past Surgical History:  Procedure Laterality Date   APPENDECTOMY     BACK SURGERY     CYST REMOVAL HAND     Right, hospital in [place] ECHOCARDIOGRAM Left 07/01/2020   Procedure: INTRAOPERATIVE TRANSTHORACIC ECHOCARDIOGRAM;  Surgeon: Sherren Mocha, MD;  Location: [facility];  Service: Open Heart Surgery;  Laterality: Left;   KNEE ARTHROSCOPY WITH MEDIAL  MENISECTOMY Right 02/28/2012   Procedure: KNEE ARTHROSCOPY WITH MEDIAL MENISECTOMY;  Surgeon: Carole Civil, MD;  Location: [facility];  Service: Orthopedics;  Laterality: Right;   REPAIR KNEE LIGAMENT     RIGHT/LEFT HEART CATH AND CORONARY ANGIOGRAPHY N/A 04/10/2020   Procedure: RIGHT/LEFT HEART CATH AND CORONARY ANGIOGRAPHY;  Surgeon: Jettie Booze, MD;  Location: [facility] [facility];  Service: Cardiovascular;  Laterality: N/A;   TRANSCATHETER AORTIC VALVE REPLACEMENT, TRANSFEMORAL N/A 07/01/2020   Procedure: TRANSCATHETER AORTIC VALVE REPLACEMENT, TRANSFEMORAL;  Surgeon: Sherren Mocha, MD;  Location: [place];  Service: Open Heart Surgery;  Laterality: N/A;   ULTRASOUND GUIDANCE FOR VASCULAR ACCESS Bilateral 07/01/2020   Procedure: ULTRASOUND GUIDANCE FOR VASCULAR ACCESS;  Surgeon: Sherren Mocha, MD;  Location: [facility];  Service: Open Heart Surgery;  Laterality: Bilateral;    Current Outpatient Medications  Medication Sig Dispense Refill   albuterol (PROVENTIL) (2.5 MG/3ML) 0.083% nebulizer solution Take 2.5 mg by nebulization every 6 (six) hours as needed for wheezing or shortness of breath.   2   allopurinol (ZYLOPRIM) 300 MG tablet Take 300 mg by mouth daily.     amLODipine (NORVASC) 5 MG tablet Take 1 tablet (5 mg total) by mouth daily. 90 tablet 3   aspirin 81 MG chewable tablet Chew 1 tablet (81 mg total) by mouth daily. 90 tablet 3   atorvastatin (LIPITOR) 20 MG tablet Take 20 mg by mouth every evening.      azithromycin (  ZITHROMAX) 500 MG tablet Take a directed 6 tablet 3   B-D UF III MINI PEN NEEDLES 31G X 5 MM MISC SMARTSIG:1 Each SUB-Q Daily     carvedilol (COREG) 6.25 MG tablet Take 1 tablet (6.25 mg total) by mouth 2 (two) times daily. 180 tablet 3   ciprofloxacin (CIPRO) 500 MG tablet Take 1 tablet (500 mg total) by mouth 2 (two) times daily. One po bid x 7 days 14 tablet 0   clopidogrel (PLAVIX) 75 MG tablet TAKE 1 TABLET IN THE MORNING WITH FOOD 90 tablet 0   ferrous  sulfate 325 (65 FE) MG EC tablet Take 325 mg by mouth daily with breakfast.     furosemide (LASIX) 40 MG tablet Take 0.5 tablets (20 mg total) by mouth daily.     gabapentin (NEURONTIN) 100 MG capsule Take 100-200 mg by mouth at bedtime.     hydrALAZINE (APRESOLINE) 50 MG tablet Take 1 tablet (50 mg total) by mouth 3 (three) times daily. 270 tablet 3   HYDROcodone-acetaminophen (NORCO/VICODIN) 5-325 MG tablet Take 1 tablet by mouth every 6 (six) hours as needed. 20 tablet 0   Insulin Detemir (LEVEMIR FLEXTOUCH) 100 UNIT/ML Pen Inject 30 Units into the skin at bedtime. (Patient taking differently: Inject 40 Units into the skin at bedtime.) 15 mL 11   loratadine (CLARITIN) 10 MG tablet Take 10 mg by mouth daily.     metFORMIN (GLUCOPHAGE) 500 MG tablet Take 1 tablet (500 mg total) by mouth 2 (two) times daily.     metroNIDAZOLE (FLAGYL) 500 MG tablet Take 500 mg by mouth 2 (two) times daily.     ULTICARE MINI PEN NEEDLES 31G X 6 MM MISC      No current facility-administered medications for this visit.   Allergies:  Amoxicillin, Macrobid [nitrofurantoin], Penicillins, and Sulfa antibiotics   ROS: No orthopnea or PND.  Physical Exam: VS:  BP 128/78   Pulse 89   Resp 18   Ht 5\' 5"  (1.651 m)   Wt 190 lb (86.2 kg)   SpO2 94%   BMI 31.62 kg/m , BMI Body mass index is 31.62 kg/m.  Wt Readings from Last 3 Encounters:  12/09/20 190 lb (86.2 kg)  11/17/20 190 lb (86.2 kg)  11/07/20 198 lb 12.8 oz (90.2 kg)    General: Patient appears comfortable at rest.  Seated in a wheelchair today. HEENT: Conjunctiva and lids normal, wearing a mask. Neck: Supple, no elevated JVP or carotid bruits, no thyromegaly. Lungs: Clear to auscultation, nonlabored breathing at rest. Cardiac: Regular rate and rhythm, no S3, 6-6/4 systolic murmur, no pericardial rub. Abdomen: Bowel sounds present. Extremities: No pitting edema.  ECG:  An ECG dated 11/17/2020 was personally reviewed today and demonstrated:  Sinus  rhythm with left anterior fascicular block and poor R wave progression rule out old anterior infarct pattern.  Recent Labwork: 07/02/2020: Magnesium 1.8 11/17/2020: ALT 12; AST 13; B Natriuretic Peptide 118.0; BUN 24; Creatinine, Ser 1.15; Hemoglobin 10.8; Platelets 183; Potassium 5.4; Sodium 134     Component Value Date/Time   CHOL 184 04/11/2020 0945   TRIG 283 (H) 04/11/2020 0945   HDL 46 04/11/2020 0945   CHOLHDL 4.0 04/11/2020 0945   VLDL 57 (H) 04/11/2020 0945   LDLCALC 81 04/11/2020 0945    Other Studies Reviewed Today:  Echocardiogram 08/07/2020:  1. Mild global reduction in LV systolic function; s/p TAVR (mean gradient  9 mmHg, peak velocity 2.1 m/s; DI 0.38; no AI); compared to 07/01/20, LV  function slightly reduced.   2. Left ventricular ejection fraction, by estimation, is 45 to 50%. The  left ventricle has mildly decreased function. The left ventricle  demonstrates global hypokinesis. There is severe left ventricular  hypertrophy. Left ventricular diastolic parameters   are consistent with Grade I diastolic dysfunction (impaired relaxation).   3. Right ventricular systolic function is normal. The right ventricular  size is normal.   4. Left atrial size was mildly dilated.   5. The mitral valve is normal in structure. No evidence of mitral valve  regurgitation. No evidence of mitral stenosis.   6. The aortic valve has been repaired/replaced. Aortic valve  regurgitation is not visualized. No aortic stenosis is present. There is a  26 mm Sapien prosthetic (TAVR) valve present in the aortic position.  Procedure Date: 07/01/2020.   7. The inferior vena cava is normal in size with greater than 50%  respiratory variability, suggesting right atrial pressure of 3 mmHg.   Assessment and Plan:  1.  Severe aortic stenosis status post TAVR in June of this year.  Valve function stable by echocardiogram in July with mean gradient 9 mmHg and no paravalvular regurgitation.  Repeat  echocardiogram in 6 months.  2.  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  Plan to increase Coreg to 6.25 mg twice daily, otherwise continue hydralazine, Norvasc, and Lasix for now.  Plan to reassess LVEF in 6 months.  3.  CKD stage IIIb, recent creatinine 1.15.  Medication Adjustments/Labs and Tests Ordered: Current medicines are reviewed at length with the patient today.  Concerns regarding medicines are outlined above.   Tests Ordered: No orders of the defined types were placed in this encounter.   Medication Changes: Meds ordered this encounter  Medications   carvedilol (COREG) 6.25 MG tablet    Sig: Take 1 tablet (6.25 mg total) by mouth 2 (two) times daily.    Dispense:  180 tablet    Refill:  3    12/09/2020 dose increase     Disposition:  Follow up  6 months.  Signed, Satira Sark, MD, [facility] 12/09/2020 2:28 PM    [facility] [place], [place], [place] [address] Phone: [phone]; Fax: [phone]

## 2020-12-17 ENCOUNTER — Ambulatory Visit (HOSPITAL_COMMUNITY): Payer: Medicare Other | Attending: Family Medicine

## 2020-12-17 ENCOUNTER — Encounter (HOSPITAL_COMMUNITY): Payer: Self-pay

## 2020-12-17 ENCOUNTER — Other Ambulatory Visit: Payer: Self-pay

## 2020-12-17 DIAGNOSIS — R29898 Other symptoms and signs involving the musculoskeletal system: Secondary | ICD-10-CM | POA: Insufficient documentation

## 2020-12-17 NOTE — Therapy (Addendum)
[place], [place], [address] Phone: [phone]   Fax:  [phone]  Occupational Therapy Wheelchair Evaluation  Patient Details  Name: Stephanie [name] MRN: [id number] Date of Birth: 1935/10/20 Referring Provider (OT): Mallie Snooks, NP   Encounter Date: 12/17/2020   OT End of Session - 12/17/20 1424     Visit Number 1    Number of Visits 1    Authorization Type BCBS Medicare    Authorization Time Period no copay. no authorization needed.    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 Burt Knack and Dr. Cyndia Bent.   Severe aortic stenosis    Symptomatic anemia    Presumed GI bleed January 2021 - she declined GI work-up   Type 2 diabetes mellitus ([place])     Past Surgical History:  Procedure Laterality Date   APPENDECTOMY     BACK SURGERY     CYST REMOVAL HAND     Right, hospital in [place] ECHOCARDIOGRAM Left 07/01/2020   Procedure: INTRAOPERATIVE TRANSTHORACIC ECHOCARDIOGRAM;  Surgeon: Sherren Mocha, MD;  Location: [facility];  Service: Open Heart Surgery;  Laterality: Left;   KNEE ARTHROSCOPY WITH MEDIAL MENISECTOMY Right 02/28/2012   Procedure: KNEE ARTHROSCOPY WITH MEDIAL MENISECTOMY;  Surgeon: Carole Civil, MD;  Location: [facility];  Service: Orthopedics;  Laterality: Right;   REPAIR KNEE LIGAMENT     RIGHT/LEFT HEART CATH AND CORONARY ANGIOGRAPHY N/A 04/10/2020   Procedure: RIGHT/LEFT HEART CATH AND CORONARY  ANGIOGRAPHY;  Surgeon: Jettie Booze, MD;  Location: [facility];  Service: Cardiovascular;  Laterality: N/A;   TRANSCATHETER AORTIC VALVE REPLACEMENT, TRANSFEMORAL N/A 07/01/2020   Procedure: TRANSCATHETER AORTIC VALVE REPLACEMENT, TRANSFEMORAL;  Surgeon: Sherren Mocha, MD;  Location: [facility];  Service: Open Heart Surgery;  Laterality: N/A;   ULTRASOUND GUIDANCE FOR VASCULAR ACCESS Bilateral 07/01/2020   Procedure: ULTRASOUND GUIDANCE FOR VASCULAR ACCESS;  Surgeon: Sherren Mocha, MD;  Location: [facility];  Service: Open Heart Surgery;  Laterality: Bilateral;    There were no vitals filed for this visit.      [facility] OT Assessment - 12/17/20 1423       Assessment   Medical Diagnosis Wheelchair evaluation    Referring Provider (OT) Mallie Snooks, NP      Precautions   Precautions Fall            Mobility/Seating Evaluation    PATIENT INFORMATION: Name: Stephanie [name] DOB: 09/24/1935  Sex: Female Date seen: 12/17/20 Time: 1:00PM  Address:  [address] [place], [place] [address] Physician: Tilda Burrow, NP This evaluation/justification form will serve as the LMN for the following suppliers: __________________________ Supplier: [employer] Contact Person: Felton Clinton, Wess Botts Phone:  [phone]   Seating Therapist: Josue Hector Phone:   [phone]   Phone: [phone]    Spouse/Parent/Caregiver name:  N/A  Phone number: N/A Insurance/Payer: BCBS Medicare     Reason for Referral: To obtain a powered wheelchair.  Patient/Caregiver Goals: To be able to move in her environment safely and independently while completing activities of daily living.    Patient was seen for face-to-face evaluation for new power wheelchair.  Josh Cadle, ATP from [place] will complete an assessment in the patient's home to discuss recommendations and wheelchair options.  Further paperwork was completed and sent to vendor.  Patient appears to qualify for power mobility  device at this time per objective findings.   MEDICAL HISTORY: Diagnosis: Primary Diagnosis: Back pain, generalized OA Onset:  Diagnosis: HTN, Severe aortic stenosis, DM II, Hyperlipidemia, Asthma   _0 Progressive Disease Relevant past and future surgeries: TAVR replacement (2022), Heart Cath (2022), back surgery (~6 years ago), Right knee arthroscopy with medial meniscectomy   Height: 5'5" Weight: 190 lb. Explain recent changes or trends in weight: N/A   History including Falls: Patient reports falling 1X this month. (November 2022).     HOME ENVIRONMENT: _1 House  _2 Condo/town home  _3 Apartment  _4 Assisted Living    _5 Lives Alone _6  Lives with Others                                                                                          Hours with caregiver: N/A  _7 Home is accessible to patient           Stairs      _8 Yes _9  No     Ramp _10 Yes _11 No Comments:  Patient lives in a mobile home with Granddaughter. Granddaughter completes all meal prep and housekeeping tasks while assisting with bathing and dressing.   COMMUNITY ADL: TRANSPORTATION: _12 Car    _13 Van    <ZOXWRUEAVWUJWJXB>_1<\/YNWGNFAOZHYQMVHQ>_46 Public Transportation    _15 [employer] w/c Lift    _16 Ambulance    _17 Other:       _18 Sits in wheelchair during transport  Employment/School:  Specific requirements pertaining to mobility   Other: Pt uses [employer] transportion.     FUNCTIONAL/SENSORY PROCESSING SKILLS:  Handedness:   _19 Right     _20 Left    _21 NA  Comments:    Functional Processing Skills for Wheeled Mobility _22 Processing Skills are adequate for safe wheelchair operation  Areas of concern than may interfere with safe operation of wheelchair Description of problem   _23  Attention to environment      _24 Judgment      _25  Hearing  _26  Vision or visual processing      _27 Motor Planning  _28  Fluctuations in Behavior      VERBAL COMMUNICATION: _29 WFL receptive _30  WFL expressive _31 Understandable  _32 Difficult to understand  _33 non-communicative _34  Uses an augmented  communication device  CURRENT SEATING / MOBILITY: Current Mobility Base:  _35 None _36 Dependent _37 Manual _38 Scooter _39 Power  Type of Control:   Manufacturer:  Size:  Age:   Current Condition of Mobility Base:     Current Wheelchair components:    Describe posture in present seating system:        SENSATION and SKIN ISSUES: Sensation _40 Intact  _41 Impaired _42 Absent  Level of sensation:  Pressure Relief: Able to perform effective pressure relief :    [  x]Yes  _0  No Method: Patient is able to stand up to relieve pressure. If not, Why?:   Skin Issues/Skin Integrity Current Skin Issues  _1 Yes _2 No _3 Intact _4  Red area_5  Open Area  _6 Scar Tissue _7 At risk from prolonged sitting Where    History of Skin Issues  _8 Yes _9 No Where   When    Hx of skin flap surgeries  _10 Yes _11 No Where   When    Limited sitting tolerance _12 Yes _13 No Hours spent sitting in wheelchair daily:   Complaint of Pain:  Please describe: Occassional back pain. 9/10 when it occurs causing her to need to lay down. Pt feels it is weather related.    Swelling/Edema: None   ADL STATUS (in reference to wheelchair use):  Indep Assist Unable Indep with Equip Not assessed Comments  Dressing _14  _15  _16  _17  _18  Assist with socks and shoes.Sits on the edge of the bed to complete dressing.   Eating _19  _20  _21  _22  _23    Toileting _24  _25  _26  _27  _28    Bathing _29  _30  _31  _32  _33  Pt completes sponge bathes. Does not get into the shower for fear of falling. Granddaughter assists with washing her back.  Grooming/Hygiene _34  _35  _36  _37  _38    Meal Prep _39  _40  _41  _42  _43  Granddaughter completes this role.   IADLS <XFGHWEXHBZJIRCVE>_9<\/FYBOFBPZWCHENIDP>_82  _45  _46  _47  _48  Granddaughter completes this role.   Bowel Management: _49 Continent  _50 Incontinent  _51 Accidents Comments:    Bladder Management: _52 Continent  _53 Incontinent  _54 Accidents Comments:       WHEELCHAIR SKILLS: Manual w/c Propulsion: _55 UE or LE strength and endurance sufficient to participate in ADLs using manual wheelchair Arm :  _56 left _57 right   _58 Both      Distance:  Foot:  _59 left _60 right   _61 Both  Operate Scooter: _62  Strength, hand grip, balance and transfer appropriate for use _63 Living environment is accessible for use of scooter  Operate Power w/c:  _64  Std. Joystick   _65  Alternative Controls Indep _66  Assist _67  Dependent/unable _68  N/A _69   _70 Safe          _71  Functional      Distance:   Bed confined without wheelchair _72  Yes _73  No   STRENGTH/RANGE OF MOTION:  Active  Range of Motion Strength  Shoulder BUE shoulder flexion, abduction, IR/er is [facility] although decreased stability noted during ROM and strength testing. Crepitus noted in right shoulder during ROM and strength testing.  BUE shoulder flexion: 4-/5, abduction 4/5, Iinternal rotation: 4/5, external rotation: 4-/5  Elbow BUE elbow flexion/extension: WFL BUE elbow flexion/extension: 5/5  Wrist/Hand BUE wrist flexion/extension: WFL  BUE wrist flexion/extension: 5/5, Functional bilateral gross grasp.  Hip BUE hip flexion: WFL BUE hip flexion: 4/5  Knee BUE knee flexion/extension: WFL BUE knee flexion/extension: 4/5  Ankle  BUE ankle dorsi flexion and planter flexion: WFL BUE ankle dorsi flexion and planter flexion: 4+/5     MOBILITY/BALANCE:  _74  Patient is totally dependent for mobility      Balance Transfers Ambulation  Sitting Balance: Standing Balance: _75  Modified Independent _76  Independent/Modified Independent  _77  WFL     _78  WFL (static) _79  Supervision _80  Supervision  _81  Uses UE for balance  _82  Supervision _83  Min Assist _84  Ambulates with Assist      _85  Min Assist _86  Min assist _87  Mod Assist _88  Ambulates with Device:      _89  RW  _90  StW  _91  Cane  _92    _93  Mod Assist _94  Mod assist _95  Max assist   _96   Max Assist _0  Max assist _1  Dependent _2  Indep. Short Distance Only  _3  Unable _4  Unable _5  Lift / Sling Required Distance (in feet)     _6  Sliding board _7  Unable to Ambulate (see explanation below)  Cardio Status:  _8 Intact  _9  Impaired   _10   NA     History of heart cath and TAVR replacement  Respiratory Status:  _11 Intact   _12 Impaired   _13 NA     Asthma. Uses a  nebulizer PRN.  Orthotics/Prosthetics: N/A  Comments (Address manual vs power w/c vs scooter): Patient uses a straight cane when out in the community for easier management. Patient utilizes a rollator at home.When completing functional transfers from surface to surface, patient is able to use bilateral UE's to push up from surface. May require several attempts if sitting on a low surface. Due to osteoarthritis and right shoulder crepitus, patient is unable  use a manual wheelchair.Current home environment does not have the adequate space for turning radius that is required with a scooter.  Patient is limited to walking short distances due to history of cardiac and respiratory issues as she will experience shortness of breathe and require a sitting rest break.              Anterior / Posterior Obliquity Rotation-Pelvis   PELVIS    _14  _15  _16   Neutral Posterior Anterior  _17  _18  _19   WFL Rt elev Lt elev  _20  _21  _22   WFL Right Left                      Anterior    Anterior     _23  Fixed _24  Other _25  Partly Flexible _26  Flexible   _27  Fixed _28  Other _29  Partly Flexible  _30  Flexible  _31  Fixed _32  Other _33  Partly Flexible  _34  Flexible   TRUNK  _35  _36  _37   [facility]  Thoracic  Lumbar  Kyphosis Lordosis  _38  _39  _40   WFL Convex Convex  Right Left _41 c-curve _42 s-curve _43 multiple  _44  Neutral _45  Left-anterior _46  Right-anterior     _47  Fixed _48  Flexible _49  Partly Flexible _50  Other  _51  Fixed _52  Flexible _53  Partly Flexible _54  Other  _55  Fixed             _56  Flexible _57  Partly Flexible _58  Other    Position Windswept    HIPS          _59            _60               _61    Neutral       Abduct        ADduct         _62           _63            _64   Neutral Right           Left      _65  Fixed _66  Subluxed _67  Partly Flexible _68  Dislocated _69  Flexible  _70  Fixed _71  Other _72  Partly Flexible  _73   Flexible                 Foot Positioning Knee Positioning      _74  WFL  _75 Lt _76 Rt _77  WFL  _78 Lt _79 Rt    KNEES ROM concerns: ROM concerns:    & Dorsi-Flexed _80 Lt _81 Rt     FEET Plantar Flexed _82 Lt _83 Rt      Inversion                 _84   Lt _0 Rt      Eversion                 _1 Lt _2 Rt     HEAD _3  Functional _4  Good Head Control    & _5  Flexed         _6  Extended _7  Adequate Head Control    NECK _8  Rotated  Lt  _9  Lat Flexed Lt _10  Rotated  Rt _11  Lat Flexed Rt _12  Limited Head Control     _13  Cervical Hyperextension _14  Absent  Head Control     SHOULDERS ELBOWS WRIST& HAND       Left     Right    Left     Right    Left     Right   U/E _15 Functional           _16 Functional functional functional _17 Fisting             _18 Fisting      _19 elev   _20 dep      _21 elev   _22 dep       _23 pro -_24 retract     _25 pro  _26 retract _27 subluxed             _28 subluxed           Goals for Wheelchair Mobility  _29  Independence with mobility in the home with motor related ADLs (MRADLs)  _30  Independence with MRADLs in the community _31  Provide dependent mobility  _32  Provide recline     _33 Provide tilt   Goals for Seating system _34  Optimize pressure distribution _35  Provide support needed to facilitate function or safety _36  Provide corrective forces to assist with maintaining or improving posture _37  Accommodate client's posture:   current seated postures and positions are not flexible or will not tolerate corrective forces _38  Client to be independent with relieving pressure in the wheelchair _39 Enhance physiological function such as breathing, swallowing, digestion  Simulation ideas/Equipment trials: State why other equipment was unsuccessful:   MOBILITY BASE RECOMMENDATIONS and JUSTIFICATION: MOBILITY COMPONENT JUSTIFICATION  Manufacturer: English as a second language teacher: Junior   Size: Width 18 Seat Depth 16 _40 provide transport from point A to B      _41 promote Indep mobility  _42 is not a safe, functional ambulator _43 walker or  cane inadequate _44 non-standard width/depth necessary to accommodate anatomical measurement _45    _46 Manual Mobility Base _47 non-functional ambulator    _48 Scooter/POV  _49 can safely operate  _50 can safely transfer   _51 has adequate trunk stability  _52 cannot functionally propel manual w/c  _53 Power Mobility Base  _54 non-ambulatory  _55 cannot functionally propel manual wheelchair  _56  cannot functionally and safely operate scooter/POV _57 can safely operate and willing to  _58 Stroller Base _59 infant/child  _60 unable to propel manual wheelchair _61 allows for growth _62 non-functional ambulator _63 non-functional UE _64 Indep mobility is not a goal at this time  _65 Tilt  _66 Forward _67 Backward _68 Powered tilt  _69 Manual tilt  _70 change position against gravitational force on head and shoulders  _71 change position for pressure relief/cannot weight shift _72 transfers  _73 management of tone _74 rest periods _75 control edema _76 facilitate postural control  _77    _78 Recline  _79 Power recline on power base _80 Manual recline on manual base  _81 accommodate femur to back angle  _82 bring to full recline for ADL care  _83 change position for pressure relief/cannot weight shift _84 rest periods _85 repositioning for transfers or clothing/diaper /catheter changes _86 head positioning  _87 Lighter weight required _88 self- propulsion  _89 lifting _90    _91 Heavy Duty required _92 user weight greater than 250# _93 extreme tone/ over active movement _94 broken frame on previous chair _95    _96   Back  _0  Angle Adjustable _1  Custom molded  _2 postural control _3 control of tone/spasticity _4 accommodation of range of motion _5 UE functional control _6 accommodation for seating system _7   _8 provide lateral trunk support _9 accommodate deformity _10 provide posterior trunk support _11 provide lumbar/sacral support _12 support trunk in midline _13 Pressure relief over spinal processes  _14  Seat Cushion  _15 impaired sensation  _16 decubitus ulcers present _17 history of  pressure ulceration _18 prevent pelvic extension _19 low maintenance  _20 stabilize pelvis  _21 accommodate obliquity _22 accommodate multiple deformity _23 neutralize lower extremity position _24 increase pressure distribution _25    _26  Pelvic/thigh support  _27  Lateral thigh guide _28  Distal medial pad  _29  Distal lateral pad _30  pelvis in neutral _31 accommodate pelvis _32  position upper legs _33  alignment _34  accommodate ROM _35  decr adduction _36 accommodate tone _37 removable for transfers _38 decr abduction  _39  Lateral trunk Supports _40  Lt     _41  Rt _42 decrease lateral trunk leaning _43 control tone _44 contour for increased contact _45 safety  _46 accommodate asymmetry _47    _48  Mounting hardware  _49 lateral trunk supports  _50 back   _51 seat _52 headrest      _53  thigh support _54 fixed   _55 swing away _56 attach seat platform/cushion to w/c frame _57 attach back cushion to w/c frame _58 mount postural supports _59 mount headrest  _60 swing medial thigh support away _61 swing lateral supports away for transfers  _62      Armrests  _63 fixed _64 adjustable height _65 removable   _66 swing away  _67 flip back   _68 reclining _69 full length pads _70 desk    _71 pads tubular  _72 provide support with elbow at 90   _73 provide support for w/c tray _74 change of height/angles for variable activities _75 remove for transfers _76 allow to come closer to table top _77 remove for access to tables _78    Hangers/ Leg rests  _79 60 _80 70 _81 90 _82 elevating _83 heavy duty  _84 articulating _85 fixed _86 lift off _87 swing away     _88 power _89 provide LE support  _90 accommodate to hamstring tightness _91 elevate legs during recline   _92 provide change in position for Legs _93 Maintain placement of feet on footplate _94 durability _95 enable transfers _96 decrease edema _97 Accommodate lower leg length _98    Foot support Footplate    <ZOXWRUEAVWUJWJXB>_1<\/YNWGNFAOZHYQMVHQ>_46 Lt  _100  Rt  _101  Center mount _102 flip up     _103 depth/angle adjustable _104 Amputee adapter    _105  Lt     _106  Rt _107 provide foot support _108 accommodate to ankle  ROM _109 transfers _110 Provide support for residual extremity _111  allow foot to go under wheelchair base _112  decrease tone  _113    _114  Ankle strap/heel loops _115 support foot on foot support _116 decrease extraneous movement _117 provide input to heel  _118 protect foot  Tires: _119 pneumatic  _120 flat free inserts  _121 solid  _122 decrease maintenance  _123 prevent frequent flats _124 increase shock absorbency _125 decrease pain from road shock _126 decrease spasms from road shock _127    _128  Headrest  _129 provide posterior head support _130 provide posterior neck support _131 provide lateral head support _132 provide anterior head support _133 support during tilt and recline _134 improve feeding   _135 improve respiration _136 placement of switches _137 safety  _138 accommodate ROM  _139 accommodate tone _140 improve visual orientation  _141  Anterior chest strap _142  Vest _143  Shoulder retractors  _144 decrease forward movement of shoulder _145 accommodation of TLSO _146 decrease forward movement of trunk _147 decrease shoulder elevation _148 added abdominal support _149 alignment _150 assistance with shoulder control  _151    Pelvic Positioner _152 Belt _153 SubASIS bar _154 Dual Pull _155 stabilize tone _156 decrease falling out of chair/ **will not Decr potential for sliding due to pelvic tilting _157 prevent excessive rotation _158 pad for protection over boney prominence _159 prominence comfort _160 special pull angle to control rotation _161    Upper Extremity Support _162 L   _163  R _164 Arm trough    _165   hand support _0  tray       _1 full tray _2 swivel mount _3 decrease edema      _4 decrease subluxation   _5 control tone   _6 placement for AAC/Computer/EADL _7 decrease gravitational pull on shoulders _8 provide midline positioning _9 provide support to increase UE function _10 provide hand support in natural position _11 provide work surface   POWER WHEELCHAIR CONTROLS  _12 Proportional  _13 Non-Proportional Type  _14 Left  _15 Right _16 provides access for controlling wheelchair   _17 lacks motor control to  operate proportional drive control <WUJWJXBJYNWGNFAO>_1<\/HYQMVHQIONGEXBMW>_41 unable to understand proportional controls  Actuator Control Module  _19 Single  _20 Multiple   _21 Allow the client to operate the power seat function(s) through the joystick control   _22 Safety Reset Switches _23 Used to change modes and stop the wheelchair when driving in latch mode    _24 [employer]   _25 programming for accurate control _26 progressive Disease/changing condition _27 non-proportional drive control needed _28 Needed in order to operate power seat functions through joystick control   _29 Display box _30 Allows user to see in which mode and drive the wheelchair is set  _31 necessary for alternate controls    _32 Digital interface electronics _33 Allows w/c to operate when using alternative drive controls  <LKGMWNUUVOZDGUYQ>_0<\/HKVQQVZDGLOVFIEP>_32 ASL Head Array _35 Allows client to operate wheelchair  through switches placed in tri-panel headrest  _36 Sip and puff with tubing kit _37 needed to operate sip and puff drive controls  <RJJOACZYSAYTKZSW>_1<\/UXNATFTDDUKGURKY>_70 Upgraded tracking electronics _39 increase safety when driving <WCBJSEGBTDVVOHYW>_7<\/PXTGGYIRSWNIOEVO>_35 correct tracking when on uneven surfaces  _41 Mount for switches or joystick _42 Attaches switches to w/c  _43 Swing away for access or transfers _44 midline for optimal placement _45 provides for consistent access  _46 Attendant controlled joystick plus mount _47 safety _48 long distance driving <KKXFGHWEXHBZJIRC>_7<\/ELFYBOFBPZWCHENI>_77 operation of seat functions _50 compliance with transportation regulations _51      Rear wheel placement/Axle adjustability _52 None _53 semi adjustable _54 fully adjustable  _55 improved UE access to wheels _56 improved stability _57 changing angle in space for improvement of postural stability _58 1-arm drive access <OEUMPNTIRWERXVQM>_0<\/QQPYPPJKDTOIZTIW>_58 amputee pad placement _60    Wheel rims/ hand rims  _61 metal  _62 plastic coated _63 oblique projections _64 vertical projections _65 Provide ability to propel manual wheelchair  _66  Increase self-propulsion with hand weakness/decreased grasp  Push handles _67 extended  _68 angle adjustable  _69 standard _70 caregiver access _71 caregiver  assist _72 allows "hooking" to enable increased ability to perform ADLs or maintain balance  One armed device  _73 Lt   _74 Rt _75 enable propulsion of manual wheelchair with one arm   _76      Brake/wheel lock extension _77  Lt   _78  Rt _79 increase indep in applying wheel locks   _80 Side guards _81 prevent clothing getting caught in wheel or becoming soiled _82  prevent skin tears/abrasions  Battery: 12 volt, 18 amp batteries X2 _83 to power wheelchair   Other:     The above equipment has a life- long use expectancy. Growth and changes in medical and/or functional conditions would be the exceptions. This is to certify that the therapist has no financial relationship with durable medical provider or manufacturer. The therapist will not receive remuneration of any kind for the equipment recommended in this evaluation.   Patient has mobility limitation that significantly impairs safe, timely participation in one or more mobility related ADL's.  (bathing, toileting, feeding, dressing, grooming, moving from room to room)                                                             _84  Yes _85  No  Will mobility device sufficiently improve ability to participate and/or be aided in participation of MRADL's?         _0  Yes _1  No Can limitation be compensated for with use of a cane or walker?                                                                                _2  Yes _3  No Does patient or caregiver demonstrate ability/potential ability & willingness to safely use the mobility device?   _4  Yes _5  No Does patient's home environment support use of recommended mobility device?                                                    _6  Yes _7  No Does patient have sufficient upper extremity function necessary to functionally propel a manual wheelchair?    _8  Yes _9  No Does patient have sufficient strength and trunk stability to safely operate a POV (scooter)?                                  _10  Yes _11  No Does patient need  additional features/benefits provided by a power wheelchair for MRADL's in the home?       _12  Yes _13  No Does the patient demonstrate the ability to safely use a power wheelchair?                                                              _14  Yes _15  No  Therapist Name Printed: Ailene Ravel OTR/L, CBIS Date: 12/17/20  Therapist's Signature:   Date:   Supplier's Name Printed:  Date:   Supplier's Signature:   Date:  Patient/Caregiver Signature:   Date:     This is to certify that I have read this evaluation and do agree with the content within:   86 Name Printed:   70 Signature:  Date:     This is to certify that I, the above signed therapist have the following affiliations: _16  This DME provider _17  Manufacturer of recommended equipment _18  Patient's long term care facility _19  None of the above          Plan - 12/17/20 1425     OT Occupational Profile and History Problem Focused Assessment - Including review of records relating to presenting problem    Occupational performance deficits (Please refer to evaluation for details): ADL's;Leisure;Social Participation    Body Structure / Function / Physical Skills Pain;Strength;Mobility;Gait    Rehab Potential Excellent    Clinical Decision Making Limited treatment options, no task modification necessary    Comorbidities Affecting Occupational Performance: Presence of comorbidities impacting occupational performance    Comorbidities impacting occupational performance description: see medical chart    Modification or Assistance to Complete Evaluation  No modification of tasks or assist necessary to complete eval    OT Frequency One time visit    OT Treatment/Interventions Other (comment)   wheelchair evaluation only   Consulted and Agree with Plan of Care Patient             Patient will benefit from skilled therapeutic intervention in order to improve the following deficits and impairments:   Body  Structure / Function / Physical Skills: Pain, Strength, Mobility, Gait       Visit Diagnosis: Other symptoms and signs involving the musculoskeletal system    Problem List Patient Active Problem List   Diagnosis Date Noted   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   Asthma 04/08/2020   DM (diabetes mellitus) ([place]) 04/08/2020   Severe aortic stenosis 04/08/2020   Symptomatic anemia 02/09/2019   Vitamin B12 deficiency    Uncontrolled type 2 diabetes mellitus with hyperglycemia ([place]) 07/06/2017   Essential hypertension, benign 07/06/2017   Mixed hyperlipidemia 07/06/2017   Class 2 severe obesity due to excess calories with serious comorbidity and body mass index (BMI) of 36.0 to 36.9 in adult ([place]) 07/06/2017   Medial meniscus, posterior horn derangement 04/12/2012   OA (osteoarthritis) of knee 04/12/2012    Ailene Ravel, OTR/L,CBIS  [phone]  12/17/2020, 2:26 PM  [facility] [address] [place], [place], [address] Phone: [phone]   Fax:  [phone]  Name: Stephanie [name] MRN: [id number] Date of Birth: 08-21-1935

## 2020-12-29 ENCOUNTER — Other Ambulatory Visit: Payer: Self-pay | Admitting: Family Medicine

## 2020-12-29 ENCOUNTER — Other Ambulatory Visit (HOSPITAL_COMMUNITY): Payer: Self-pay | Admitting: Family Medicine

## 2020-12-29 DIAGNOSIS — R1031 Right lower quadrant pain: Secondary | ICD-10-CM

## 2020-12-29 DIAGNOSIS — Z13820 Encounter for screening for osteoporosis: Secondary | ICD-10-CM

## 2020-12-30 NOTE — Progress Notes (Signed)
Is it the one she had done last month?

## 2020-12-31 ENCOUNTER — Ambulatory Visit (INDEPENDENT_AMBULATORY_CARE_PROVIDER_SITE_OTHER): Payer: Medicare Other | Admitting: Urology

## 2020-12-31 ENCOUNTER — Other Ambulatory Visit: Payer: Self-pay

## 2020-12-31 DIAGNOSIS — N2889 Other specified disorders of kidney and ureter: Secondary | ICD-10-CM | POA: Diagnosis not present

## 2020-12-31 NOTE — Progress Notes (Signed)
12/31/2020 2:37 PM   Stephanie [name] [birth year] [id number]  Referring provider: [facility] [place],   [address]  Patient location: home Physician location: office I connected with  Stephanie [name] on 12/31/20 by a video enabled telemedicine application and verified that I am speaking with the correct person using two identifiers.   I discussed the limitations of evaluation and management by telemedicine. The patient expressed understanding and agreed to proceed.    Followup left renal mass   HPI: Stephanie [name] is a 85yo here for followup for a left renal mass. CT from 11/25/2020 shows a stable 18mm left renal mass. She underwetn TAVR in June 2022. 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 Burt Knack and Dr. Cyndia Bent.   Severe aortic stenosis    Symptomatic anemia    Presumed GI bleed January 2021 - she declined GI work-up   Type 2 diabetes mellitus ([place])     Surgical History: Past Surgical History:  Procedure Laterality Date   APPENDECTOMY     BACK SURGERY     CYST REMOVAL HAND     Right, hospital in [place] ECHOCARDIOGRAM Left 07/01/2020   Procedure: INTRAOPERATIVE TRANSTHORACIC ECHOCARDIOGRAM;  Surgeon: Sherren Mocha, MD;  Location: [facility];  Service: Open Heart Surgery;  Laterality: Left;   KNEE ARTHROSCOPY WITH MEDIAL MENISECTOMY Right 02/28/2012   Procedure: KNEE ARTHROSCOPY WITH MEDIAL MENISECTOMY;  Surgeon: Carole Civil, MD;  Location: [facility];  Service: Orthopedics;  Laterality: Right;   REPAIR KNEE LIGAMENT     RIGHT/LEFT HEART CATH AND CORONARY ANGIOGRAPHY N/A 04/10/2020   Procedure: RIGHT/LEFT HEART CATH AND CORONARY ANGIOGRAPHY;   Surgeon: Jettie Booze, MD;  Location: [facility];  Service: Cardiovascular;  Laterality: N/A;   TRANSCATHETER AORTIC VALVE REPLACEMENT, TRANSFEMORAL N/A 07/01/2020   Procedure: TRANSCATHETER AORTIC VALVE REPLACEMENT, TRANSFEMORAL;  Surgeon: Sherren Mocha, MD;  Location: [facility];  Service: Open Heart Surgery;  Laterality: N/A;   ULTRASOUND GUIDANCE FOR VASCULAR ACCESS Bilateral 07/01/2020   Procedure: ULTRASOUND GUIDANCE FOR VASCULAR ACCESS;  Surgeon: Sherren Mocha, MD;  Location: [facility];  Service: Open Heart Surgery;  Laterality: Bilateral;    Home Medications:  Allergies as of 12/31/2020       Reactions   Amoxicillin Swelling   Tongue swelling   Macrobid [nitrofurantoin] Nausea And Vomiting   Penicillins Swelling   Has patient had a PCN reaction causing immediate rash, facial/tongue/throat swelling, SOB or lightheadedness with hypotension: No Has patient had a PCN reaction causing severe rash involving mucus membranes or skin necrosis: No Has patient had a PCN reaction that required hospitalization: No Has patient had a PCN reaction occurring within the last 10 years: No If all of the above answers are "NO", then may proceed with Cephalosporin use.   Sulfa Antibiotics Swelling   Lip swelling        Medication List        Accurate as of December 31, 2020  2:37 PM. If you have any questions, ask your nurse or doctor.          albuterol (2.5 MG/3ML) 0.083% nebulizer solution Commonly known  as: PROVENTIL Take 2.5 mg by nebulization every 6 (six) hours as needed for wheezing or shortness of breath.   allopurinol 300 MG tablet Commonly known as: ZYLOPRIM Take 300 mg by mouth daily.   amLODipine 5 MG tablet Commonly known as: NORVASC Take 1 tablet (5 mg total) by mouth daily.   aspirin 81 MG chewable tablet Chew 1 tablet (81 mg total) by mouth daily.   atorvastatin 20 MG tablet Commonly known as: LIPITOR Take 20 mg by mouth every evening.   azithromycin  500 MG tablet Commonly known as: ZITHROMAX Take a directed   B-D UF III MINI PEN NEEDLES 31G X 5 MM Misc Generic drug: Insulin Pen Needle SMARTSIG:1 Each SUB-Q Daily   UltiCare Mini Pen Needles 31G X 6 MM Misc Generic drug: Insulin Pen Needle   carvedilol 6.25 MG tablet Commonly known as: COREG Take 1 tablet (6.25 mg total) by mouth 2 (two) times daily.   ciprofloxacin 500 MG tablet Commonly known as: Cipro Take 1 tablet (500 mg total) by mouth 2 (two) times daily. One po bid x 7 days   clopidogrel 75 MG tablet Commonly known as: PLAVIX TAKE 1 TABLET IN THE MORNING WITH FOOD   ferrous sulfate 325 (65 FE) MG EC tablet Take 325 mg by mouth daily with breakfast.   furosemide 40 MG tablet Commonly known as: LASIX Take 0.5 tablets (20 mg total) by mouth daily.   gabapentin 100 MG capsule Commonly known as: NEURONTIN Take 100-200 mg by mouth at bedtime.   hydrALAZINE 50 MG tablet Commonly known as: APRESOLINE Take 1 tablet (50 mg total) by mouth 3 (three) times daily.   HYDROcodone-acetaminophen 5-325 MG tablet Commonly known as: NORCO/VICODIN Take 1 tablet by mouth every 6 (six) hours as needed.   Levemir FlexTouch 100 UNIT/ML FlexPen Generic drug: insulin detemir Inject 30 Units into the skin at bedtime. What changed: how much to take   loratadine 10 MG tablet Commonly known as: CLARITIN Take 10 mg by mouth daily.   metFORMIN 500 MG tablet Commonly known as: GLUCOPHAGE Take 1 tablet (500 mg total) by mouth 2 (two) times daily.   metroNIDAZOLE 500 MG tablet Commonly known as: FLAGYL Take 500 mg by mouth 2 (two) times daily.        Allergies:  Allergies  Allergen Reactions   Amoxicillin Swelling    Tongue swelling    Macrobid [Nitrofurantoin] Nausea And Vomiting   Penicillins Swelling    Has patient had a PCN reaction causing immediate rash, facial/tongue/throat swelling, SOB or lightheadedness with hypotension: No Has patient had a PCN reaction  causing severe rash involving mucus membranes or skin necrosis: No Has patient had a PCN reaction that required hospitalization: No Has patient had a PCN reaction occurring within the last 10 years: No If all of the above answers are "NO", then may proceed with Cephalosporin use.    Sulfa Antibiotics Swelling    Lip swelling    Family History: Family History  Problem Relation Age of Onset   Lung disease Other    Cancer Other    Arthritis Other    Asthma Other    Diabetes Other    Diabetes Mother    Hypertension Mother    Diverticulitis Mother    Diabetes Father    Hypertension Sister    Diabetes Brother    Colon cancer Neg Hx     Social History:  reports that she has quit smoking. Her smoking use included cigarettes. She has  never used smokeless tobacco. She reports that she does not drink alcohol and does not use drugs.  ROS: All other review of systems were reviewed and are negative except what is noted above in HPI   Laboratory Data: Lab Results  Component Value Date   WBC 9.1 11/17/2020   HGB 10.8 (L) 11/17/2020   HCT 35.8 (L) 11/17/2020   MCV 78.7 (L) 11/17/2020   PLT 183 11/17/2020    Lab Results  Component Value Date   CREATININE 1.15 (H) 11/17/2020    No results found for: PSA  No results found for: TESTOSTERONE  Lab Results  Component Value Date   HGBA1C 9.4 (H) 06/27/2020    Urinalysis    Component Value Date/Time   COLORURINE YELLOW 08/24/2020 1801   APPEARANCEUR HAZY (A) 08/24/2020 1801   APPEARANCEUR Hazy (A) 05/23/2020 1119   LABSPEC 1.017 08/24/2020 1801   PHURINE 5.0 08/24/2020 1801   GLUCOSEU >=500 (A) 08/24/2020 1801   HGBUR NEGATIVE 08/24/2020 1801   BILIRUBINUR NEGATIVE 08/24/2020 1801   BILIRUBINUR Negative 05/23/2020 1119   KETONESUR NEGATIVE 08/24/2020 1801   PROTEINUR NEGATIVE 08/24/2020 1801   UROBILINOGEN 0.2 04/25/2014 0936   NITRITE NEGATIVE 08/24/2020 1801   LEUKOCYTESUR LARGE (A) 08/24/2020 1801    Lab Results   Component Value Date   LABMICR See below: 05/23/2020   WBCUA 6-10 (A) 05/23/2020   LABEPIT 0-10 05/23/2020   BACTERIA RARE (A) 08/24/2020    Pertinent Imaging: CT 11/25/2020: Images reviewed and discussed with the patient No results found for this or any previous visit.  No results found for this or any previous visit.  No results found for this or any previous visit.  No results found for this or any previous visit.  No results found for this or any previous visit.  No results found for this or any previous visit.  No results found for this or any previous visit.  No results found for this or any previous visit.   Assessment & Plan:    1. Renal mass RTC 6 months with renal US   No follow-ups on file.  Nicolette Bang, MD  [facility] [place]

## 2021-01-06 ENCOUNTER — Ambulatory Visit (HOSPITAL_COMMUNITY)
Admission: RE | Admit: 2021-01-06 | Discharge: 2021-01-06 | Disposition: A | Discharge status: Home / Self Care | Payer: Medicare Other | Source: Ambulatory Visit | Attending: Family Medicine | Admitting: Family Medicine

## 2021-01-06 ENCOUNTER — Other Ambulatory Visit (HOSPITAL_COMMUNITY): Payer: Self-pay | Admitting: Family Medicine

## 2021-01-06 ENCOUNTER — Other Ambulatory Visit: Payer: Self-pay

## 2021-01-06 DIAGNOSIS — R1031 Right lower quadrant pain: Secondary | ICD-10-CM

## 2021-01-06 DIAGNOSIS — M8589 Other specified disorders of bone density and structure, multiple sites: Secondary | ICD-10-CM | POA: Diagnosis not present

## 2021-01-06 DIAGNOSIS — Z794 Long term (current) use of insulin: Secondary | ICD-10-CM | POA: Insufficient documentation

## 2021-01-06 DIAGNOSIS — E119 Type 2 diabetes mellitus without complications: Secondary | ICD-10-CM | POA: Insufficient documentation

## 2021-01-06 DIAGNOSIS — Z780 Asymptomatic menopausal state: Secondary | ICD-10-CM | POA: Diagnosis not present

## 2021-01-06 DIAGNOSIS — Z13820 Encounter for screening for osteoporosis: Secondary | ICD-10-CM | POA: Diagnosis present

## 2021-01-08 ENCOUNTER — Encounter: Payer: Self-pay | Admitting: Urology

## 2021-01-08 NOTE — Patient Instructions (Signed)
Renal Mass  A renal mass is an abnormal growth in the kidney. It may be found while performing an MRI, CT scan, or ultrasound to evaluate other problems of the abdomen. A renal mass that is cancerous (malignant) may grow or spread quickly. Others are not cancerous (benign). Renal masses include: Tumors. These may be malignant or benign. The most common type of kidney cancer in adults is renal cell carcinoma. In children, the most common type of kidney cancer is Wilms tumor. The most common benign tumors of the kidney include renal adenomas, oncocytomas, and angiomyolipoma (AML). Cysts. These are fluid-filled sacs that form on or in the kidney. What are the causes? Certain types of cancers, infections, or injuries can cause a renal mass. It isnot always known what causes a cyst to develop in or on the kidney. What are the signs or symptoms? Often, a renal mass does not cause any signs or symptoms; most kidney cysts donot cause symptoms. How is this diagnosed? Your health care provider may recommend tests to diagnose the cause of your renal mass. These tests may be done if a renal mass is found: Physical exam. Blood tests. Urine tests. Imaging tests, such as ultrasound, CT scan, or MRI. Biopsy. This is a small sample that is removed from the renal mass and tested in a lab. The exact tests and how often they are done will depend on: The size and appearance of the renal mass. Risk factors or medical conditions that increase your risk for problems. Any symptoms associated with the renal mass, or concerns that you have about it. Tests and physical exams may be done once, or they may be done regularly for a period of time. Tests and exams that are done regularly will help monitorwhether the mass is growing and beginning to cause problems. How is this treated? Treatment is not always needed for this condition. Your health care provider may recommend careful monitoring and regular tests and exams.  Treatment willdepend on the cause of the mass. Treatment for a cancerous renal mass may include surgical removal,chemotherapy, radiation, or immunotherapy. Most kidney cysts do not need to be treated. Follow these instructions at home: What you need to do at home will depend on the cause of the mass. Follow the instructions that your health care provider gives to you. In general: Take over-the-counter and prescription medicines only as told by your health care provider. If you were prescribed an antibiotic medicine, take it as told by your health care provider. Do not stop taking the antibiotic even if you start to feel better. Follow any restrictions that are given to you by your health care provider. Keep all follow-up visits. This is important. You may need to see your health care provider once or twice a year to have CT scans and ultrasounds. These tests will show if your renal mass has changed or grown. Contact a health care provider if you: Have pain in your side or back (flank pain). Have a fever. Feel full soon after eating. Have pain or swelling in the abdomen. Lose weight. Get help right away if: Your pain gets worse. There is blood in your urine. You cannot urinate. You have chest pain. You have trouble breathing. These symptoms may represent a serious problem that is an emergency. Do not wait to see if the symptoms will go away. Get medical help right away. Call your local emergency services (911 in the [place] Summary A renal mass is an abnormal growth in the kidney.   It may be cancerous (malignant) and grow or spread quickly, or it may not be cancerous (benign). Renal masses often do not have any signs or symptoms. Renal masses may be found while performing an MRI, CT scan, or ultrasound for other problems of the abdomen. Your health care provider may recommend that you have tests to diagnose the cause of your renal mass. These may include a physical exam, blood tests, urine  tests, imaging, or a biopsy. Treatment is not always needed for this condition. Careful monitoring may be recommended. This information is not intended to replace advice given to you by your health care provider. Make sure you discuss any questions you have with your healthcare provider. Document Revised: 07/02/2019 Document Reviewed: 07/02/2019 Elsevier Patient Education  2022 [employer]  

## 2021-01-16 ENCOUNTER — Institutional Professional Consult (permissible substitution): Payer: Medicare Other | Admitting: Internal Medicine

## 2021-01-21 ENCOUNTER — Ambulatory Visit: Payer: Medicare Other | Admitting: Urology

## 2021-02-05 ENCOUNTER — Other Ambulatory Visit: Payer: Self-pay | Admitting: Family Medicine

## 2021-02-05 ENCOUNTER — Other Ambulatory Visit (HOSPITAL_COMMUNITY): Payer: Self-pay | Admitting: Family Medicine

## 2021-02-05 DIAGNOSIS — R300 Dysuria: Secondary | ICD-10-CM

## 2021-02-05 DIAGNOSIS — R109 Unspecified abdominal pain: Secondary | ICD-10-CM

## 2021-02-10 ENCOUNTER — Other Ambulatory Visit (INDEPENDENT_AMBULATORY_CARE_PROVIDER_SITE_OTHER): Payer: Self-pay | Admitting: Nurse Practitioner

## 2021-02-10 DIAGNOSIS — I739 Peripheral vascular disease, unspecified: Secondary | ICD-10-CM

## 2021-02-11 ENCOUNTER — Encounter (INDEPENDENT_AMBULATORY_CARE_PROVIDER_SITE_OTHER): Payer: Medicare Other

## 2021-02-11 ENCOUNTER — Ambulatory Visit (INDEPENDENT_AMBULATORY_CARE_PROVIDER_SITE_OTHER): Payer: Medicare Other | Admitting: Nurse Practitioner

## 2021-02-13 ENCOUNTER — Other Ambulatory Visit: Payer: Self-pay

## 2021-02-13 ENCOUNTER — Ambulatory Visit (INDEPENDENT_AMBULATORY_CARE_PROVIDER_SITE_OTHER): Payer: Medicare Other

## 2021-02-13 ENCOUNTER — Encounter (INDEPENDENT_AMBULATORY_CARE_PROVIDER_SITE_OTHER): Payer: Self-pay | Admitting: Nurse Practitioner

## 2021-02-13 ENCOUNTER — Ambulatory Visit (INDEPENDENT_AMBULATORY_CARE_PROVIDER_SITE_OTHER): Payer: Medicare Other | Admitting: Nurse Practitioner

## 2021-02-13 VITALS — BP 147/77 | HR 84 | Resp 16 | Wt 197.0 lb

## 2021-02-13 DIAGNOSIS — I739 Peripheral vascular disease, unspecified: Secondary | ICD-10-CM | POA: Diagnosis not present

## 2021-02-13 DIAGNOSIS — I10 Essential (primary) hypertension: Secondary | ICD-10-CM

## 2021-02-13 DIAGNOSIS — E782 Mixed hyperlipidemia: Secondary | ICD-10-CM | POA: Diagnosis not present

## 2021-02-14 ENCOUNTER — Encounter (INDEPENDENT_AMBULATORY_CARE_PROVIDER_SITE_OTHER): Payer: Self-pay | Admitting: Nurse Practitioner

## 2021-02-14 NOTE — Progress Notes (Signed)
Subjective:    Patient ID: Stephanie [name], female    DOB: 07/12/1935, 86 y.o.   MRN: [id number] Chief Complaint  Patient presents with   Follow-up    Ultrasound follow up    Stephanie [name] is a 86 year old female that returns to follow-up on her peripheral arterial disease.  The patient notes that the previous pain she was having has decreased greatly.  She notes that she has it but occasionally.  She notes that when she does have pain, Tylenol typically is helpful for fixing it.  She continues to have no rest pain or open ulcerations.  She has no claudication-like symptoms and has been walking more recently.  Today noninvasive symptoms show a right ABI of 1.23 with a TBI 0.67.  The left is 1.09 with a TBI 0.38.  Previously the ABI was 0.81 on the right and 0.79 on the left.  She has biphasic waveforms in the right and monophasic waveforms on the left.  Toe waveforms are normal on the right and slightly dampened on the left.   Review of Systems  Musculoskeletal:  Positive for gait problem.  Neurological:  Positive for weakness.  All other systems reviewed and are negative.     Objective:   Physical Exam Vitals reviewed.  HENT:     Head: Normocephalic.  Cardiovascular:     Rate and Rhythm: Normal rate.     Pulses:          Dorsalis pedis pulses are detected w/ Doppler on the right side and detected w/ Doppler on the left side.       Posterior tibial pulses are detected w/ Doppler on the right side and detected w/ Doppler on the left side.  Pulmonary:     Effort: Pulmonary effort is normal.  Skin:    General: Skin is warm and dry.  Neurological:     Mental Status: She is alert and oriented to person, place, and time.  Psychiatric:        Mood and Affect: Mood normal.        Behavior: Behavior normal.        Thought Content: Thought content normal.        Judgment: Judgment normal.    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 Burt Knack and Dr. Cyndia Bent.   Severe aortic stenosis    Symptomatic anemia    Presumed GI bleed January 2021 - she declined GI work-up   Type 2 diabetes mellitus ([place])     Social History   Socioeconomic History   Marital status: Divorced    Spouse name: Not on file   Number of children: 3   Years of education: Not on file   Highest education level: Not on file  Occupational History   Occupation: Retired-cook   Tobacco Use   Smoking status: Former    Years: 7.00    Types: Cigarettes   Smokeless tobacco: Never  Vaping Use   Vaping Use: Never used  Substance and Sexual Activity  Alcohol use: No   Drug use: No   Sexual activity: Never  Other Topics Concern   Not on file  Social History Narrative   ** Merged History Encounter **       Social Determinants of Health   Financial Resource Strain: Not on file  Food Insecurity: Not on file  Transportation Needs: Not on file  Physical Activity: Not on file  Stress: Not on file  Social Connections: Not on file  Intimate Partner Violence: Not on file    Past Surgical History:  Procedure Laterality Date   APPENDECTOMY     BACK SURGERY     CYST REMOVAL HAND     Right, hospital in [place] ECHOCARDIOGRAM Left 07/01/2020   Procedure: INTRAOPERATIVE TRANSTHORACIC ECHOCARDIOGRAM;  Surgeon: Sherren Mocha, MD;  Location: [place] [facility];  Service: Open Heart Surgery;  Laterality: Left;   KNEE ARTHROSCOPY WITH MEDIAL MENISECTOMY Right 02/28/2012   Procedure: KNEE ARTHROSCOPY WITH MEDIAL MENISECTOMY;  Surgeon: Carole Civil, MD;  Location: [facility];  Service: Orthopedics;  Laterality: Right;   REPAIR KNEE LIGAMENT     RIGHT/LEFT  HEART CATH AND CORONARY ANGIOGRAPHY N/A 04/10/2020   Procedure: RIGHT/LEFT HEART CATH AND CORONARY ANGIOGRAPHY;  Surgeon: Jettie Booze, MD;  Location: [facility];  Service: Cardiovascular;  Laterality: N/A;   TRANSCATHETER AORTIC VALVE REPLACEMENT, TRANSFEMORAL N/A 07/01/2020   Procedure: TRANSCATHETER AORTIC VALVE REPLACEMENT, TRANSFEMORAL;  Surgeon: Sherren Mocha, MD;  Location: [facility];  Service: Open Heart Surgery;  Laterality: N/A;   ULTRASOUND GUIDANCE FOR VASCULAR ACCESS Bilateral 07/01/2020   Procedure: ULTRASOUND GUIDANCE FOR VASCULAR ACCESS;  Surgeon: Sherren Mocha, MD;  Location: [place] [facility];  Service: Open Heart Surgery;  Laterality: Bilateral;    Family History  Problem Relation Age of Onset   Lung disease Other    Cancer Other    Arthritis Other    Asthma Other    Diabetes Other    Diabetes Mother    Hypertension Mother    Diverticulitis Mother    Diabetes Father    Hypertension Sister    Diabetes Brother    Colon cancer Neg Hx     Allergies  Allergen Reactions   Amoxicillin Swelling    Tongue swelling    Nitrofurantoin Nausea And Vomiting    Other reaction(s): vomiting   Penicillins Swelling    Has patient had a PCN reaction causing immediate rash, facial/tongue/throat swelling, SOB or lightheadedness with hypotension: No Has patient had a PCN reaction causing severe rash involving mucus membranes or skin necrosis: No Has patient had a PCN reaction that required hospitalization: No Has patient had a PCN reaction occurring within the last 10 years: No If all of the above answers are "NO", then may proceed with Cephalosporin use.    Sulfa Antibiotics Swelling    Lip swelling    CBC Latest Ref Rng & Units 11/17/2020 09/03/2020 07/02/2020  WBC 4.0 - 10.5 K/uL 9.1 10.8(H) 12.2(H)  Hemoglobin 12.0 - 15.0 g/dL 10.8(L) 10.6(L) 11.2(L)  Hematocrit 36.0 - 46.0 % 35.8(L) 34.6(L) 36.4  Platelets 150 - 400 K/uL 183 243 220      CMP     Component Value  Date/Time   NA 134 (L) 11/17/2020 0942   NA 129 (L) 08/22/2020 1119   K 5.4 (H) 11/17/2020 0942   CL 98 11/17/2020 0942   CO2 29 11/17/2020 0942   GLUCOSE 323 (H) 11/17/2020 0942   BUN 24 (H) 11/17/2020 0942   BUN  27 08/22/2020 1119   CREATININE 1.15 (H) 11/17/2020 0942   CALCIUM 10.0 11/17/2020 0942   PROT 6.6 11/17/2020 0942   ALBUMIN 3.5 11/17/2020 0942   AST 13 (L) 11/17/2020 0942   ALT 12 11/17/2020 0942   ALKPHOS 163 (H) 11/17/2020 0942   BILITOT 0.2 (L) 11/17/2020 0942   GFRNONAA 48 (L) 11/17/2020 0942   GFRAA 49 (L) 02/10/2019 0610     No results found.     Assessment & Plan:   1. PAD (peripheral artery disease) (HCC)  Recommend:  The patient has evidence of atherosclerosis of the lower extremities with claudication.  The patient does not voice lifestyle limiting changes at this point in time.  Noninvasive studies do not suggest clinically significant change.  No invasive studies, angiography or surgery at this time The patient should continue walking and begin a more formal exercise program.  The patient should continue antiplatelet therapy and aggressive treatment of the lipid abnormalities  No changes in the patient's medications at this time  The patient should continue wearing graduated compression socks 10-15 mmHg strength to control the mild edema.    Patient return in 6 months or sooner if issues worsen.  2. Essential hypertension, benign Continue antihypertensive medications as already ordered, these medications have been reviewed and there are no changes at this time.   3. Mixed hyperlipidemia Continue statin as ordered and reviewed, no changes at this time    Current Outpatient Medications on File Prior to Visit  Medication Sig Dispense Refill   albuterol (PROVENTIL) (2.5 MG/3ML) 0.083% nebulizer solution Take 2.5 mg by nebulization every 6 (six) hours as needed for wheezing or shortness of breath.   2   allopurinol (ZYLOPRIM) 300 MG tablet  Take 300 mg by mouth daily.     amLODipine (NORVASC) 5 MG tablet Take 1 tablet (5 mg total) by mouth daily. 90 tablet 3   aspirin 81 MG chewable tablet Chew 1 tablet (81 mg total) by mouth daily. 90 tablet 3   atorvastatin (LIPITOR) 20 MG tablet Take 20 mg by mouth every evening.      B-D UF III MINI PEN NEEDLES 31G X 5 MM MISC SMARTSIG:1 Each SUB-Q Daily     carvedilol (COREG) 6.25 MG tablet Take 1 tablet (6.25 mg total) by mouth 2 (two) times daily. 180 tablet 3   clopidogrel (PLAVIX) 75 MG tablet TAKE 1 TABLET IN THE MORNING WITH FOOD 90 tablet 0   ferrous sulfate 325 (65 FE) MG EC tablet Take 325 mg by mouth daily with breakfast.     furosemide (LASIX) 40 MG tablet Take 0.5 tablets (20 mg total) by mouth daily.     gabapentin (NEURONTIN) 100 MG capsule Take 100-200 mg by mouth at bedtime.     hydrALAZINE (APRESOLINE) 50 MG tablet Take 1 tablet (50 mg total) by mouth 3 (three) times daily. 270 tablet 3   Insulin Detemir (LEVEMIR FLEXTOUCH) 100 UNIT/ML Pen Inject 30 Units into the skin at bedtime. (Patient taking differently: Inject 40 Units into the skin at bedtime.) 15 mL 11   loratadine (CLARITIN) 10 MG tablet Take 10 mg by mouth daily.     metFORMIN (GLUCOPHAGE) 500 MG tablet Take 1 tablet (500 mg total) by mouth 2 (two) times daily.     ULTICARE MINI PEN NEEDLES 31G X 6 MM MISC      azithromycin (ZITHROMAX) 500 MG tablet Take a directed (Patient not taking: Reported on 02/13/2021) 6 tablet 3   ciprofloxacin (CIPRO)  500 MG tablet Take 1 tablet (500 mg total) by mouth 2 (two) times daily. One po bid x 7 days (Patient not taking: Reported on 02/13/2021) 14 tablet 0   HYDROcodone-acetaminophen (NORCO/VICODIN) 5-325 MG tablet Take 1 tablet by mouth every 6 (six) hours as needed. (Patient not taking: Reported on 02/13/2021) 20 tablet 0   metroNIDAZOLE (FLAGYL) 500 MG tablet Take 500 mg by mouth 2 (two) times daily. (Patient not taking: Reported on 02/13/2021)     No current facility-administered  medications on file prior to visit.    There are no Patient Instructions on file for this visit. No follow-ups on file.   Kris Hartmann, NP

## 2021-02-19 ENCOUNTER — Institutional Professional Consult (permissible substitution): Payer: Medicare Other | Admitting: Internal Medicine

## 2021-02-19 NOTE — Progress Notes (Incomplete)
Stephanie [name], female    DOB: [birth year],    MRN: [id number]   Brief patient profile:  ***  yo*** *** referred to pulmonary clinic in [place]  02/19/2021 by *** for ***      History of Present Illness  02/19/2021  Pulmonary/ 1st office eval/ [name] / [place] [facility]  No chief complaint on file.    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 Burt Knack and Dr. Cyndia Bent.   Severe aortic stenosis    Symptomatic anemia    Presumed GI bleed January 2021 - she declined GI work-up   Type 2 diabetes mellitus ([place])     Outpatient Medications Prior to Visit  Medication Sig Dispense Refill   albuterol (PROVENTIL) (2.5 MG/3ML) 0.083% nebulizer solution Take 2.5 mg by nebulization every 6 (six) hours as needed for wheezing or shortness of breath.   2   allopurinol (ZYLOPRIM) 300 MG tablet Take 300 mg by mouth daily.     amLODipine (NORVASC) 5 MG tablet Take 1 tablet (5 mg total) by mouth daily. 90 tablet 3   aspirin 81 MG chewable tablet Chew 1 tablet (81 mg total) by mouth daily. 90 tablet 3   atorvastatin (LIPITOR) 20 MG tablet Take 20 mg by mouth every evening.      azithromycin (ZITHROMAX) 500 MG tablet Take a directed (Patient not taking: Reported on 02/13/2021) 6 tablet 3   B-D UF III MINI PEN NEEDLES 31G X 5 MM MISC SMARTSIG:1 Each SUB-Q Daily     carvedilol (COREG) 6.25 MG tablet Take 1 tablet (6.25 mg total) by mouth 2 (two) times daily. 180 tablet 3   ciprofloxacin (CIPRO) 500 MG tablet Take 1 tablet (500 mg total) by mouth 2 (two) times daily. One po bid x 7 days (Patient not taking: Reported on 02/13/2021) 14 tablet 0   clopidogrel (PLAVIX) 75 MG tablet TAKE 1 TABLET IN THE MORNING WITH FOOD 90 tablet 0    ferrous sulfate 325 (65 FE) MG EC tablet Take 325 mg by mouth daily with breakfast.     furosemide (LASIX) 40 MG tablet Take 0.5 tablets (20 mg total) by mouth daily.     gabapentin (NEURONTIN) 100 MG capsule Take 100-200 mg by mouth at bedtime.     hydrALAZINE (APRESOLINE) 50 MG tablet Take 1 tablet (50 mg total) by mouth 3 (three) times daily. 270 tablet 3   HYDROcodone-acetaminophen (NORCO/VICODIN) 5-325 MG tablet Take 1 tablet by mouth every 6 (six) hours as needed. (Patient not taking: Reported on 02/13/2021) 20 tablet 0   Insulin Detemir (LEVEMIR FLEXTOUCH) 100 UNIT/ML Pen Inject 30 Units into the skin at bedtime. (Patient taking differently: Inject 40 Units into the skin at bedtime.) 15 mL 11   loratadine (CLARITIN) 10 MG tablet Take 10 mg by mouth daily.     metFORMIN (GLUCOPHAGE) 500 MG tablet Take 1 tablet (500 mg total) by mouth 2 (two) times daily.     metroNIDAZOLE (FLAGYL) 500 MG tablet Take 500 mg by mouth 2 (two) times daily. (Patient not taking: Reported on 02/13/2021)     ULTICARE MINI PEN NEEDLES 31G X 6 MM MISC  No facility-administered medications prior to visit.     Objective:     There were no vitals taken for this visit.      I personally reviewed images and agree with radiology impression as follows:   Chest CT w/o contrast 11/19/20 1. Six months stability of an anterior right lower lobe 1.4 cm pulmonary nodule. This is reassuring, but the nodule remains technically indeterminate. Recommend chest CT follow-up at 6-12 months.    Assessment   No problem-specific Assessment & Plan notes found for this encounter.     Christinia Gully, MD 02/19/2021

## 2021-03-03 ENCOUNTER — Other Ambulatory Visit: Payer: Self-pay | Admitting: *Deleted

## 2021-03-03 DIAGNOSIS — Z952 Presence of prosthetic heart valve: Secondary | ICD-10-CM

## 2021-03-05 ENCOUNTER — Telehealth: Payer: Self-pay | Admitting: *Deleted

## 2021-03-05 ENCOUNTER — Emergency Department (HOSPITAL_COMMUNITY): Payer: Medicare Other

## 2021-03-05 ENCOUNTER — Encounter (HOSPITAL_COMMUNITY): Payer: Self-pay

## 2021-03-05 ENCOUNTER — Other Ambulatory Visit: Payer: Self-pay

## 2021-03-05 ENCOUNTER — Emergency Department (HOSPITAL_COMMUNITY)
Admission: EM | Admit: 2021-03-05 | Discharge: 2021-03-05 | Disposition: A | Discharge status: Home / Self Care | Payer: Medicare Other | Attending: Student | Admitting: Student

## 2021-03-05 DIAGNOSIS — Z87891 Personal history of nicotine dependence: Secondary | ICD-10-CM | POA: Diagnosis not present

## 2021-03-05 DIAGNOSIS — R072 Precordial pain: Secondary | ICD-10-CM | POA: Insufficient documentation

## 2021-03-05 DIAGNOSIS — I129 Hypertensive chronic kidney disease with stage 1 through stage 4 chronic kidney disease, or unspecified chronic kidney disease: Secondary | ICD-10-CM | POA: Diagnosis not present

## 2021-03-05 DIAGNOSIS — E871 Hypo-osmolality and hyponatremia: Secondary | ICD-10-CM | POA: Diagnosis not present

## 2021-03-05 DIAGNOSIS — E1165 Type 2 diabetes mellitus with hyperglycemia: Secondary | ICD-10-CM | POA: Insufficient documentation

## 2021-03-05 DIAGNOSIS — E1122 Type 2 diabetes mellitus with diabetic chronic kidney disease: Secondary | ICD-10-CM | POA: Insufficient documentation

## 2021-03-05 DIAGNOSIS — Z951 Presence of aortocoronary bypass graft: Secondary | ICD-10-CM | POA: Insufficient documentation

## 2021-03-05 DIAGNOSIS — M79602 Pain in left arm: Secondary | ICD-10-CM | POA: Insufficient documentation

## 2021-03-05 DIAGNOSIS — Z7902 Long term (current) use of antithrombotics/antiplatelets: Secondary | ICD-10-CM | POA: Insufficient documentation

## 2021-03-05 DIAGNOSIS — R0789 Other chest pain: Secondary | ICD-10-CM

## 2021-03-05 DIAGNOSIS — J45909 Unspecified asthma, uncomplicated: Secondary | ICD-10-CM | POA: Insufficient documentation

## 2021-03-05 DIAGNOSIS — N189 Chronic kidney disease, unspecified: Secondary | ICD-10-CM | POA: Insufficient documentation

## 2021-03-05 DIAGNOSIS — Z7982 Long term (current) use of aspirin: Secondary | ICD-10-CM | POA: Insufficient documentation

## 2021-03-05 DIAGNOSIS — M79601 Pain in right arm: Secondary | ICD-10-CM | POA: Diagnosis not present

## 2021-03-05 DIAGNOSIS — Z794 Long term (current) use of insulin: Secondary | ICD-10-CM | POA: Insufficient documentation

## 2021-03-05 DIAGNOSIS — Z7984 Long term (current) use of oral hypoglycemic drugs: Secondary | ICD-10-CM | POA: Diagnosis not present

## 2021-03-05 LAB — CBC WITH DIFFERENTIAL/PLATELET
Abs Immature Granulocytes: 0.04 10*3/uL (ref 0.00–0.07)
Basophils Absolute: 0 10*3/uL (ref 0.0–0.1)
Basophils Relative: 1 %
Eosinophils Absolute: 0.3 10*3/uL (ref 0.0–0.5)
Eosinophils Relative: 4 %
HCT: 39.6 % (ref 36.0–46.0)
Hemoglobin: 12.3 g/dL (ref 12.0–15.0)
Immature Granulocytes: 1 %
Lymphocytes Relative: 13 %
Lymphs Abs: 1 10*3/uL (ref 0.7–4.0)
MCH: 26.3 pg (ref 26.0–34.0)
MCHC: 31.1 g/dL (ref 30.0–36.0)
MCV: 84.6 fL (ref 80.0–100.0)
Monocytes Absolute: 0.6 10*3/uL (ref 0.1–1.0)
Monocytes Relative: 7 %
Neutro Abs: 6 10*3/uL (ref 1.7–7.7)
Neutrophils Relative %: 74 %
Platelets: 180 10*3/uL (ref 150–400)
RBC: 4.68 MIL/uL (ref 3.87–5.11)
RDW: 16.4 % — ABNORMAL HIGH (ref 11.5–15.5)
WBC: 8 10*3/uL (ref 4.0–10.5)
nRBC: 0 % (ref 0.0–0.2)

## 2021-03-05 LAB — COMPREHENSIVE METABOLIC PANEL
ALT: 11 U/L (ref 0–44)
AST: 17 U/L (ref 15–41)
Albumin: 3.5 g/dL (ref 3.5–5.0)
Alkaline Phosphatase: 163 U/L — ABNORMAL HIGH (ref 38–126)
Anion gap: 11 (ref 5–15)
BUN: 29 mg/dL — ABNORMAL HIGH (ref 8–23)
CO2: 23 mmol/L (ref 22–32)
Calcium: 9.8 mg/dL (ref 8.9–10.3)
Chloride: 100 mmol/L (ref 98–111)
Creatinine, Ser: 1.35 mg/dL — ABNORMAL HIGH (ref 0.44–1.00)
GFR, Estimated: 39 mL/min — ABNORMAL LOW (ref >60)
Glucose, Bld: 282 mg/dL — ABNORMAL HIGH (ref 70–99)
Potassium: 4.3 mmol/L (ref 3.5–5.1)
Sodium: 134 mmol/L — ABNORMAL LOW (ref 135–145)
Total Bilirubin: 0.2 mg/dL — ABNORMAL LOW (ref 0.3–1.2)
Total Protein: 6.7 g/dL (ref 6.5–8.1)

## 2021-03-05 LAB — TROPONIN I (HIGH SENSITIVITY)
Troponin I (High Sensitivity): 12 ng/L (ref <18)
Troponin I (High Sensitivity): 11 ng/L (ref <18)

## 2021-03-05 MED ORDER — ACETAMINOPHEN 500 MG PO TABS
1000.0000 mg | ORAL_TABLET | Freq: Once | ORAL | Status: AC
  Administered 2021-03-05: 1000 mg via ORAL
  Filled 2021-03-05: qty 2

## 2021-03-05 MED ORDER — IOHEXOL 350 MG/ML SOLN
75.0000 mL | Freq: Once | INTRAVENOUS | Status: AC | PRN
  Administered 2021-03-05: 75 mL via INTRAVENOUS

## 2021-03-05 NOTE — ED Provider Notes (Signed)
[facility] EMERGENCY DEPARTMENT Provider Note  CSN: [id number] Arrival date & time: 03/05/21 1040  Chief Complaint(s) Chest Pain  HPI ARYN SAFRAN is a 86 y.o. female with PMH CKD, severe aortic stenosis status post TAVR, symptomatic anemia, T2DM who presents emergency department for evaluation of chest pain.  Patient states that she has had chest pain for approximately 2 weeks that worsened last night.  Also endorses bilateral arm pain.  States that the pain is sharp, substernal and nonradiating.  Denies shortness of breath, diaphoresis, nausea, vomiting or other systemic symptoms.  Denies trauma to the chest.   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 Burt Knack and Dr. Cyndia Bent.   Severe aortic stenosis    Symptomatic anemia    Presumed GI bleed January 2021 - she declined GI work-up   Type 2 diabetes mellitus ([place])    Patient Active Problem List   Diagnosis Date Noted   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   Asthma 04/08/2020   DM (diabetes mellitus) ([place]) 04/08/2020   Severe aortic stenosis 04/08/2020   Symptomatic anemia 02/09/2019   Vitamin B12 deficiency    Uncontrolled type 2 diabetes mellitus with hyperglycemia ([place]) 07/06/2017   Essential hypertension, benign 07/06/2017   Mixed hyperlipidemia 07/06/2017   Class 2 severe obesity due to excess calories with serious comorbidity and body mass index (BMI) of 36.0 to 36.9 in adult ([place]) 07/06/2017   Medial meniscus, posterior horn derangement 04/12/2012   OA (osteoarthritis) of knee 04/12/2012   Home Medication(s) Prior to Admission medications   Medication Sig Start Date End Date Taking? Authorizing Provider  albuterol (PROVENTIL)  (2.5 MG/3ML) 0.083% nebulizer solution Take 2.5 mg by nebulization every 6 (six) hours as needed for wheezing or shortness of breath.  10/20/17  Yes [provider]  allopurinol (ZYLOPRIM) 300 MG tablet Take 300 mg by mouth daily. 06/24/20  Yes [provider]  amLODipine (NORVASC) 5 MG tablet Take 1 tablet (5 mg total) by mouth daily. 04/24/20  Yes Strader, Fransisco Hertz, PA-C  aspirin 81 MG chewable tablet Chew 1 tablet (81 mg total) by mouth daily. 07/02/20  Yes Eileen Stanford, PA-C  atorvastatin (LIPITOR) 20 MG tablet Take 20 mg by mouth every evening.    Yes [provider]  carvedilol (COREG) 6.25 MG tablet Take 1 tablet (6.25 mg total) by mouth 2 (two) times daily. 12/09/20  Yes Satira Sark, MD  clopidogrel (PLAVIX) 75 MG tablet TAKE 1 TABLET IN THE MORNING WITH FOOD 10/21/20  Yes Eileen Stanford, PA-C  ferrous sulfate 325 (65 FE) MG EC tablet Take 325 mg by mouth daily with breakfast.   Yes [provider]  furosemide (LASIX) 40 MG tablet Take 0.5 tablets (20 mg total) by mouth daily. 08/25/20  Yes Eileen Stanford, PA-C  gabapentin (NEURONTIN) 100 MG capsule Take 100-200 mg by mouth at bedtime. 10/02/19  Yes [provider]  hydrALAZINE (APRESOLINE) 50 MG tablet Take 1 tablet (50 mg total) by mouth 3 (three) times daily. 10/21/20  Yes Eileen Stanford, PA-C  Insulin Detemir (LEVEMIR FLEXTOUCH) 100 UNIT/ML Pen Inject 30 Units into the skin at bedtime. 02/10/19  Yes  Johnson, Clanford L, MD  loratadine (CLARITIN) 10 MG tablet Take 10 mg by mouth daily. 06/23/20  Yes [provider]  metFORMIN (GLUCOPHAGE) 500 MG tablet Take 1 tablet (500 mg total) by mouth 2 (two) times daily. 07/02/20  Yes Eileen Stanford, PA-C  azithromycin (ZITHROMAX) 500 MG tablet Take a directed Patient not taking: Reported on 02/13/2021 07/17/20   Eileen Stanford, PA-C  B-D UF III MINI PEN NEEDLES 31G X 5 MM MISC SMARTSIG:1 Each SUB-Q Daily 04/09/20    [provider]  ciprofloxacin (CIPRO) 500 MG tablet Take 1 tablet (500 mg total) by mouth 2 (two) times daily. One po bid x 7 days Patient not taking: Reported on 02/13/2021 08/24/20   Milton Ferguson, MD  HYDROcodone-acetaminophen (NORCO/VICODIN) 5-325 MG tablet Take 1 tablet by mouth every 6 (six) hours as needed. Patient not taking: Reported on 02/13/2021 08/24/20   Milton Ferguson, MD  metroNIDAZOLE (FLAGYL) 500 MG tablet Take 500 mg by mouth 2 (two) times daily. Patient not taking: Reported on 02/13/2021 11/06/20   [provider]                                                                                                                                    Past Surgical History Past Surgical History:  Procedure Laterality Date   APPENDECTOMY     BACK SURGERY     CYST REMOVAL HAND     Right, hospital in [place] ECHOCARDIOGRAM Left 07/01/2020   Procedure: INTRAOPERATIVE TRANSTHORACIC ECHOCARDIOGRAM;  Surgeon: Sherren Mocha, MD;  Location: [place];  Service: Open Heart Surgery;  Laterality: Left;   KNEE ARTHROSCOPY WITH MEDIAL MENISECTOMY Right 02/28/2012   Procedure: KNEE ARTHROSCOPY WITH MEDIAL MENISECTOMY;  Surgeon: Carole Civil, MD;  Location: [facility];  Service: Orthopedics;  Laterality: Right;   REPAIR KNEE LIGAMENT     RIGHT/LEFT HEART CATH AND CORONARY ANGIOGRAPHY N/A 04/10/2020   Procedure: RIGHT/LEFT HEART CATH AND CORONARY ANGIOGRAPHY;  Surgeon: Jettie Booze, MD;  Location: [place];  Service: Cardiovascular;  Laterality: N/A;   TRANSCATHETER AORTIC VALVE REPLACEMENT, TRANSFEMORAL N/A 07/01/2020   Procedure: TRANSCATHETER AORTIC VALVE REPLACEMENT, TRANSFEMORAL;  Surgeon: Sherren Mocha, MD;  Location: [place];  Service: Open Heart Surgery;  Laterality: N/A;   ULTRASOUND GUIDANCE FOR VASCULAR ACCESS Bilateral 07/01/2020   Procedure: ULTRASOUND GUIDANCE FOR VASCULAR ACCESS;  Surgeon: Sherren Mocha, MD;  Location: [place];   Service: Open Heart Surgery;  Laterality: Bilateral;   Family History Family History  Problem Relation Age of Onset   Lung disease Other    Cancer Other    Arthritis Other    Asthma Other    Diabetes Other    Diabetes Mother    Hypertension Mother    Diverticulitis Mother    Diabetes Father    Hypertension Sister    Diabetes Brother    Colon cancer Neg Hx  Social History Social History   Tobacco Use   Smoking status: Former    Years: 7.00    Types: Cigarettes   Smokeless tobacco: Never  Vaping Use   Vaping Use: Never used  Substance Use Topics   Alcohol use: No   Drug use: No   Allergies Amoxicillin, Nitrofurantoin, Penicillins, and Sulfa antibiotics  Review of Systems Review of Systems  Cardiovascular:  Positive for chest pain.   Physical Exam Vital Signs  I have reviewed the triage vital signs BP (!) 159/83 (BP Location: Right Arm)    Pulse 72    Temp 98.3 F (36.8 C) (Oral)    Resp 20    Ht 5\' 5"  (1.651 m)    Wt 90.7 kg    SpO2 97%    BMI 33.28 kg/m   Physical Exam Vitals and nursing note reviewed.  Constitutional:      General: She is not in acute distress.    Appearance: She is well-developed.  HENT:     Head: Normocephalic and atraumatic.  Eyes:     Conjunctiva/sclera: Conjunctivae normal.  Cardiovascular:     Rate and Rhythm: Normal rate and regular rhythm.     Heart sounds: Murmur heard.  Pulmonary:     Effort: Pulmonary effort is normal. No respiratory distress.     Breath sounds: Normal breath sounds.  Abdominal:     Palpations: Abdomen is soft.     Tenderness: There is no abdominal tenderness.  Musculoskeletal:        General: No swelling.     Cervical back: Neck supple.  Skin:    General: Skin is warm and dry.     Capillary Refill: Capillary refill takes less than 2 seconds.  Neurological:     Mental Status: She is alert.  Psychiatric:        Mood and Affect: Mood normal.    ED Results and Treatments Labs (all labs ordered  are listed, but only abnormal results are displayed) Labs Reviewed  CBC WITH DIFFERENTIAL/PLATELET - Abnormal; Notable for the following components:      Result Value   RDW 16.4 (*)    All other components within normal limits  COMPREHENSIVE METABOLIC PANEL - Abnormal; Notable for the following components:   Sodium 134 (*)    Glucose, Bld 282 (*)    BUN 29 (*)    Creatinine, Ser 1.35 (*)    Alkaline Phosphatase 163 (*)    Total Bilirubin 0.2 (*)    GFR, Estimated 39 (*)    All other components within normal limits  TROPONIN I (HIGH SENSITIVITY)  TROPONIN I (HIGH SENSITIVITY)                                                                                                                          Radiology DG Chest 2 View  Result Date: 03/05/2021 CLINICAL DATA:  Chest pain, shortness of breath, asthma EXAM: CHEST - 2 VIEW COMPARISON:  Chest radiograph  11/17/2020 FINDINGS: The heart is enlarged, unchanged. An aortic valve prosthesis is again noted. The upper mediastinal contours are stable. There is no focal consolidation or pulmonary edema. There is no pleural effusion or pneumothorax. Nodularity in the lateral right apex likely corresponds to pleural thickening/scarring seen on prior CT. Note that the previously described nodule seen on the CT chest from 11/19/2020 is not identified on the current study. There is mild multilevel degenerative change of the thoracic spine. There is no acute osseous abnormality. Lumbar spine fusion hardware is partially imaged. IMPRESSION: 1. No radiographic evidence of acute cardiopulmonary process. Note that the right lower lobe nodule seen on the CT chest from 11/19/2020 is not seen on the current study. 2. Unchanged cardiomegaly. Electronically Signed   By: Valetta Mole M.D.   On: 03/05/2021 12:12   CT Angio Chest PE W and/or Wo Contrast  Result Date: 03/05/2021 CLINICAL DATA:  Chest pain EXAM: CT ANGIOGRAPHY CHEST WITH CONTRAST TECHNIQUE: Multidetector CT  imaging of the chest was performed using the standard protocol during bolus administration of intravenous contrast. Multiplanar CT image reconstructions and MIPs were obtained to evaluate the vascular anatomy. RADIATION DOSE REDUCTION: This exam was performed according to the departmental dose-optimization program which includes automated exposure control, adjustment of the mA and/or kV according to patient size and/or use of iterative reconstruction technique. CONTRAST:  41mL OMNIPAQUE IOHEXOL 350 MG/ML SOLN COMPARISON:  CT chest 11/19/2020 FINDINGS: Cardiovascular: No pulmonary embolism identified. Main pulmonary artery is mildly dilated measuring 3.4 cm in diameter. Heart is enlarged. No pericardial effusion identified. Coronary artery calcifications and aortic valve prosthesis. Thoracic aorta is normal in caliber with moderate atherosclerotic plaques. Mediastinum/Nodes: No bulky axillary, hilar or mediastinal lymphadenopathy identified. Lungs/Pleura: Redemonstration and stable size and appearance of a 1.6 x 0.8 cm mixed semi-solid nodule with irregular and ground-glass margins located in the anterior aspect of the right lower lobe. No new pulmonary nodule identified. Stable appearing biapical and upper posterior pleural thickening. No pleural effusion or pneumothorax. Upper Abdomen: No acute process identified. Tiny hiatal hernia. 1.9 cm left adrenal adenoma. Hepatic and renal hypodense likely cysts. Musculoskeletal: No chest wall abnormality. No acute or significant osseous findings. Review of the MIP images confirms the above findings. IMPRESSION: 1. No pulmonary embolism identified. 2. Cardiomegaly. Distended pulmonary artery which may represent pulmonary artery hypertension. 3. No significant change in size or appearance of a 1.6 x 0.8 cm pulmonary nodule in the right lower lobe. Consider follow-up CT in 12 months. 4. Coronary artery disease and atherosclerotic disease. 5. Other chronic findings as  described. Aortic Atherosclerosis (ICD10-I70.0). Electronically Signed   By: Ofilia Neas M.D.   On: 03/05/2021 13:33    Pertinent labs & imaging results that were available during my care of the patient were reviewed by me and considered in my medical decision making (see MDM for details).  Medications Ordered in ED Medications  acetaminophen (TYLENOL) tablet 1,000 mg (1,000 mg Oral Given 03/05/21 1256)  iohexol (OMNIPAQUE) 350 MG/ML injection 75 mL (75 mLs Intravenous Contrast Given 03/05/21 1307)  Procedures Procedures  (including critical care time)  Medical Decision Making / ED Course   This patient presents to the ED for concern of chest pain and arm pain, this involves an extensive number of treatment options, and is a complaint that carries with it a high risk of complications and morbidity.  The differential diagnosis includes ACS, PE, pneumonia, pleurisy, costochondritis  MDM: Patient seen in the emergency department for evaluation of chest pain.  Physical exam largely unremarkable outside of generalized tenderness to bilateral upper extremities.  No midline C-spine pain.  Laboratory evaluation with mild hyponatremia 134, BUN 29, creatinine 1.35 which is near baseline for this patient.  Troponin high-sensitivity troponin unremarkable.  ECG with A-fib but no ST elevation or other evidence of ischemia.  Chest x-ray with cardiomegaly but no evidence of pneumonia.  Given the patient's persistent symptoms a CT PE was ordered that was reassuringly negative for pulmonary embolism, does show evidence of pulmonary hypertension, stable right lower lobe pulmonary nodule, coronary arterial atherosclerotic disease.  Patient given Tylenol for her arm pain on reevaluation, her chest and arm pain have completely resolved.  She is able to ambulate without difficulty here  in the emergency department with no shortness of breath or persistent chest pain.  Patient has no chest pain here in the emergency department and due to her risk factors and EKG, she does have a heart score of 5.  I have lower suspicion for cardiac etiology here in the emergency department but due to her heart score, she was instructed to call her cardiologist and set up closer follow-up.  Patient states that she will call her cardiologist.  Delta troponin being negative is reassuring and with no evidence of acute life-threatening pathology, patient was discharged with close cardiology follow-up.  As the patient's AVS did not initially have Dr. Domenic Polite listed, the AVS was altered after the patient discharged.  However, we did have an extensive discussion about the patient's need to call her cardiologist to which she voiced understanding.  I also sent Dr. Domenic Polite a message asking him to follow-up with the patient.   Additional history obtained: -Additional history obtained from daughter -External records from outside source obtained and reviewed including: Chart review including previous notes, labs, imaging, consultation notes   Lab Tests: -I ordered, reviewed, and interpreted labs.   The pertinent results include:   Labs Reviewed  CBC WITH DIFFERENTIAL/PLATELET - Abnormal; Notable for the following components:      Result Value   RDW 16.4 (*)    All other components within normal limits  COMPREHENSIVE METABOLIC PANEL - Abnormal; Notable for the following components:   Sodium 134 (*)    Glucose, Bld 282 (*)    BUN 29 (*)    Creatinine, Ser 1.35 (*)    Alkaline Phosphatase 163 (*)    Total Bilirubin 0.2 (*)    GFR, Estimated 39 (*)    All other components within normal limits  TROPONIN I (HIGH SENSITIVITY)  TROPONIN I (HIGH SENSITIVITY)      EKG  Atrial fibrillation, no ST elevation or depression, left bundle    Imaging Studies ordered: I ordered imaging studies including CXR,  CTPE I independently visualized and interpreted imaging. I agree with the radiologist interpretation   Medicines ordered and prescription drug management: Meds ordered this encounter  Medications   acetaminophen (TYLENOL) tablet 1,000 mg   iohexol (OMNIPAQUE) 350 MG/ML injection 75 mL    -I have reviewed the patients home medicines and have  made adjustments as needed  Critical interventions none  Cardiac Monitoring: The patient was maintained on a cardiac monitor.  I personally viewed and interpreted the cardiac monitored which showed an underlying rhythm of: afib, rate controlled  Social Determinants of Health:  Factors impacting patients care include: none   Reevaluation: After the interventions noted above, I reevaluated the patient and found that they have :improved  Co morbidities that complicate the patient evaluation  Past Medical History:  Diagnosis Date   Arthritis    Asthma    CKD (chronic kidney disease)    Essential hypertension    Mixed hyperlipidemia    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 Burt Knack and Dr. Cyndia Bent.   Severe aortic stenosis    Symptomatic anemia    Presumed GI bleed January 2021 - she declined GI work-up   Type 2 diabetes mellitus ([place])       Dispostion: I considered admission for this patient, but with negative laboratory work-up and overall heart score of 5, patient and I used shared decision-making to elect for outpatient cardiology follow-up due to lack of symptoms here in the emergency department.     Final Clinical Impression(s) / ED Diagnoses Final diagnoses:  Atypical chest pain     @PCDICTATION @    Teressa Lower, MD 03/05/21 [phone]

## 2021-03-05 NOTE — Telephone Encounter (Signed)
Contacted to remind of appointment scheduled with [place] on 03/13/21 @3 :00 pm at the [facility] location Verbalized that she was aware

## 2021-03-05 NOTE — ED Triage Notes (Signed)
Pt reports that she has chest pain x [redacted] weeks along with bilateral arm pain.  Reports pain is sharp.  Reports pain is intermittent.  Rates pain currently a 9/10.  Denies sob.  Hx of cardiac sent 1 year ago.

## 2021-03-05 NOTE — ED Notes (Signed)
EKG already done and sent just has not been printed

## 2021-03-09 ENCOUNTER — Other Ambulatory Visit: Payer: Self-pay | Admitting: Physician Assistant

## 2021-03-13 ENCOUNTER — Ambulatory Visit: Payer: Medicare Other | Admitting: Cardiovascular Disease

## 2021-03-13 NOTE — Progress Notes (Unsigned)
No chief complaint on file.  History of Present Illness: 86 yo female with history of CAD, chronic kidney disease, HTN, hyperlipidemia, diabetes mellitus and aortic stenosis who is here today for follow up in the valve clinic. Cardiac cath in March 2022 with mild non-obstructive CAD. She had severe aortic stenosis and underwent TAVR in June 2022 with placement of a 26 mm Edwards Sapien 3 Ultra THV from the transfemoral approach. Echo  July 2022 with LVEF=45-50% with normally functioning AVR with no PVL. Severe LVH.  She was seen in valve clinic in July 2022 and was doing well.   She was seen by Dr. Domenic Polite for follow up in November 2022 and was doing well. She went into the ED 03/05/21 with c/o chest pain. Troponin was negative. Chest CTA without evidence of PE. Atrial fib noted on ED EKG per records but EKG not available in the medical record.   The patient denies any dyspnea, palpitations, lower extremity edema, orthopnea, PND, dizziness, near syncope or syncope.    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 Burt Knack and Dr. Cyndia Bent.   Severe aortic stenosis    Symptomatic anemia    Presumed GI bleed January 2021 - she declined GI work-up   Type 2 diabetes mellitus ([place])     Past Surgical History:  Procedure Laterality Date   APPENDECTOMY     BACK SURGERY     CYST REMOVAL HAND     Right, hospital in [place] ECHOCARDIOGRAM Left 07/01/2020   Procedure: INTRAOPERATIVE TRANSTHORACIC ECHOCARDIOGRAM;  Surgeon: Sherren Mocha, MD;  Location: [facility];  Service: Open Heart Surgery;  Laterality: Left;   KNEE ARTHROSCOPY WITH MEDIAL MENISECTOMY  Right 02/28/2012   Procedure: KNEE ARTHROSCOPY WITH MEDIAL MENISECTOMY;  Surgeon: Carole Civil, MD;  Location: [facility];  Service: Orthopedics;  Laterality: Right;   REPAIR KNEE LIGAMENT     RIGHT/LEFT HEART CATH AND CORONARY ANGIOGRAPHY N/A 04/10/2020   Procedure: RIGHT/LEFT HEART CATH AND CORONARY ANGIOGRAPHY;  Surgeon: Jettie Booze, MD;  Location: [facility];  Service: Cardiovascular;  Laterality: N/A;   TRANSCATHETER AORTIC VALVE REPLACEMENT, TRANSFEMORAL N/A 07/01/2020   Procedure: TRANSCATHETER AORTIC VALVE REPLACEMENT, TRANSFEMORAL;  Surgeon: Sherren Mocha, MD;  Location: [facility];  Service: Open Heart Surgery;  Laterality: N/A;   ULTRASOUND GUIDANCE FOR VASCULAR ACCESS Bilateral 07/01/2020   Procedure: ULTRASOUND GUIDANCE FOR VASCULAR ACCESS;  Surgeon: Sherren Mocha, MD;  Location: [facility];  Service: Open Heart Surgery;  Laterality: Bilateral;    Current Outpatient Medications  Medication Sig Dispense Refill   albuterol (PROVENTIL) (2.5 MG/3ML) 0.083% nebulizer solution Take 2.5 mg by nebulization every 6 (six) hours as needed for wheezing or shortness of breath.   2   allopurinol (ZYLOPRIM) 300 MG tablet Take 300 mg by mouth daily.     amLODipine (NORVASC) 5 MG tablet Take 1 tablet (5 mg total) by mouth daily. 90 tablet 3   aspirin 81 MG chewable tablet Chew 1 tablet (81 mg total) by mouth daily. 90 tablet 3   atorvastatin (LIPITOR) 20 MG tablet Take 20 mg by mouth every evening.  azithromycin (ZITHROMAX) 500 MG tablet Take a directed (Patient not taking: Reported on 02/13/2021) 6 tablet 3   B-D UF III MINI PEN NEEDLES 31G X 5 MM MISC SMARTSIG:1 Each SUB-Q Daily     carvedilol (COREG) 6.25 MG tablet Take 1 tablet (6.25 mg total) by mouth 2 (two) times daily. 180 tablet 3   ciprofloxacin (CIPRO) 500 MG tablet Take 1 tablet (500 mg total) by mouth 2 (two) times daily. One po bid x 7 days (Patient not taking: Reported on 02/13/2021) 14 tablet 0   clopidogrel (PLAVIX) 75  MG tablet TAKE 1 TABLET IN THE MORNING WITH FOOD 90 tablet 0   ferrous sulfate 325 (65 FE) MG EC tablet Take 325 mg by mouth daily with breakfast.     furosemide (LASIX) 40 MG tablet TAKE 1 TABLET BY MOUTH ONCE DAILY. 90 tablet 3   gabapentin (NEURONTIN) 100 MG capsule Take 100-200 mg by mouth at bedtime.     hydrALAZINE (APRESOLINE) 50 MG tablet Take 1 tablet (50 mg total) by mouth 3 (three) times daily. 270 tablet 3   HYDROcodone-acetaminophen (NORCO/VICODIN) 5-325 MG tablet Take 1 tablet by mouth every 6 (six) hours as needed. (Patient not taking: Reported on 02/13/2021) 20 tablet 0   Insulin Detemir (LEVEMIR FLEXTOUCH) 100 UNIT/ML Pen Inject 30 Units into the skin at bedtime. 15 mL 11   loratadine (CLARITIN) 10 MG tablet Take 10 mg by mouth daily.     metFORMIN (GLUCOPHAGE) 500 MG tablet Take 1 tablet (500 mg total) by mouth 2 (two) times daily.     metroNIDAZOLE (FLAGYL) 500 MG tablet Take 500 mg by mouth 2 (two) times daily. (Patient not taking: Reported on 02/13/2021)     No current facility-administered medications for this visit.    Allergies  Allergen Reactions   Amoxicillin Swelling    Tongue swelling    Nitrofurantoin Nausea And Vomiting    Other reaction(s): vomiting   Penicillins Swelling    Has patient had a PCN reaction causing immediate rash, facial/tongue/throat swelling, SOB or lightheadedness with hypotension: No Has patient had a PCN reaction causing severe rash involving mucus membranes or skin necrosis: No Has patient had a PCN reaction that required hospitalization: No Has patient had a PCN reaction occurring within the last 10 years: No If all of the above answers are "NO", then may proceed with Cephalosporin use.    Sulfa Antibiotics Swelling    Lip swelling    Social History   Socioeconomic History   Marital status: Divorced    Spouse name: Not on file   Number of children: 3   Years of education: Not on file   Highest education level: Not on file   Occupational History   Occupation: Retired-cook   Tobacco Use   Smoking status: Former    Years: 7.00    Types: Cigarettes   Smokeless tobacco: Never  Vaping Use   Vaping Use: Never used  Substance and Sexual Activity   Alcohol use: No   Drug use: No   Sexual activity: Never  Other Topics Concern   Not on file  Social History Narrative   ** Merged History Encounter **       Social Determinants of Health   Financial Resource Strain: Not on file  Food Insecurity: Not on file  Transportation Needs: Not on file  Physical Activity: Not on file  Stress: Not on file  Social Connections: Not on file  Intimate Partner Violence: Not on file  Family History  Problem Relation Age of Onset   Lung disease Other    Cancer Other    Arthritis Other    Asthma Other    Diabetes Other    Diabetes Mother    Hypertension Mother    Diverticulitis Mother    Diabetes Father    Hypertension Sister    Diabetes Brother    Colon cancer Neg Hx     Review of Systems:  As stated in the HPI and otherwise negative.   There were no vitals taken for this visit.  Physical Examination: General: Well developed, well nourished, NAD  HEENT: OP clear, mucus membranes moist  SKIN: warm, dry. No rashes. Neuro: No focal deficits  Musculoskeletal: Muscle strength 5/5 all ext  Psychiatric: Mood and affect normal  Neck: No JVD, no carotid bruits, no thyromegaly, no lymphadenopathy.  Lungs:Clear bilaterally, no wheezes, rhonci, crackles Cardiovascular: Regular rate and rhythm. No murmurs, gallops or rubs. Abdomen:Soft. Bowel sounds present. Non-tender.  Extremities: No lower extremity edema. Pulses are 2 + in the bilateral DP/PT.  EKG:  EKG is *** ordered today. The ekg ordered today demonstrates   Recent Labs: 07/02/2020: Magnesium 1.8 11/17/2020: B Natriuretic Peptide 118.0 03/05/2021: ALT 11; BUN 29; Creatinine, Ser 1.35; Hemoglobin 12.3; Platelets 180; Potassium 4.3; Sodium 134    Wt  Readings from Last 3 Encounters:  03/05/21 200 lb (90.7 kg)  02/13/21 197 lb (89.4 kg)  12/09/20 190 lb (86.2 kg)      Assessment and Plan:   1. Severe Aortic Valve Stenosis s/p TAVR:   2. CAD ***   Labs/ tests ordered today include:   No orders of the defined types were placed in this encounter.    Disposition:   FU with me in one year. Echo in six months with 6 month f/u with Dr. Domenic Polite.    Signed, Lauree Chandler, MD 03/13/2021 10:25 AM    [facility] [address], [place], [place]  [address] Phone: [phone]; Fax: [phone]

## 2021-03-18 ENCOUNTER — Ambulatory Visit (HOSPITAL_COMMUNITY): Admission: RE | Admit: 2021-03-18 | Payer: Medicare Other | Source: Ambulatory Visit

## 2021-03-18 ENCOUNTER — Encounter (HOSPITAL_COMMUNITY): Payer: Self-pay

## 2021-03-31 ENCOUNTER — Ambulatory Visit (INDEPENDENT_AMBULATORY_CARE_PROVIDER_SITE_OTHER): Payer: Medicare Other | Admitting: Internal Medicine

## 2021-03-31 ENCOUNTER — Encounter: Payer: Self-pay | Admitting: Internal Medicine

## 2021-03-31 ENCOUNTER — Other Ambulatory Visit: Payer: Self-pay

## 2021-03-31 VITALS — BP 128/88 | HR 88 | Temp 98.4°F | Ht 65.0 in | Wt 199.0 lb

## 2021-03-31 DIAGNOSIS — R0609 Other forms of dyspnea: Secondary | ICD-10-CM

## 2021-03-31 DIAGNOSIS — R911 Solitary pulmonary nodule: Secondary | ICD-10-CM | POA: Diagnosis not present

## 2021-03-31 NOTE — Assessment & Plan Note (Addendum)
Quit smoking around 2000 ?- CTa 03/05/21 1.6 x 0.8 cm mixed semi-solid nodule with irregular and ground-glass ?margins located in the anterior aspect of the right lower lobe no change since 11/19/20 so rec f/u in one year >  Placed in reminder file  ? ?She is relatively low but not no risk. ? ?CT results reviewed with pt >>> solid part too  small for PET and not suspicious enough for excisional bx > really only option for now is follow the Fleischner society guidelines as rec by radiology.  ? ?In meantime return for pfts for baseline in case surgery needed in the future. ? ? ?Discussed in detail all the  indications, usual  risks and alternatives  relative to the benefits with patient who agrees to proceed with w/u as outlined.    ? ? ? ?I had an extended discussion with the patient and reviewed all relevant studies so total time was 30 minutes with moderate level of MDM. ? ?Each maintenance medication was reviewed in detail including most importantly the difference between maintenance and prns and under what circumstances the prns are to be triggered using an action plan format that is not reflected in the computer generated alphabetically organized AVS.   ? ? ?Please see AVS for specific instructions unique to this visit that I personally wrote and verbalized to the the pt in detail and then reviewed with pt  by my nurse highlighting any  changes in therapy recommended at today's visit to their plan of care.   ?

## 2021-03-31 NOTE — Patient Instructions (Signed)
We will schedule you for PFTs next available and also contact you in the future for CT @ 1 years from the last.  ? ?Call sooner for worse breathing or chest pain when you breathe in or cough that won't go away after a few weeks. ? ? ? ? ? ?

## 2021-03-31 NOTE — Progress Notes (Signed)
? ?Stephanie [name], female    DOB: 20-Apr-1935,   MRN: [id number] ? ? ?Brief patient profile:  ?63   yobf  quit smoking 2000 with doe then  referred to pulmonary clinic in [place]  03/31/2021 by Kathlene November for spn   ? ? ? ? ?History of Present Illness  ?03/31/2021  Pulmonary/ 1st office eval/ [name] / [facility]  ?Chief Complaint  ?Patient presents with  ? Consult  ?  CT done in Feb 2023 showed pulmonary nodules.   ?Dyspnea:  can still do [employer] but usually uses scooter due to back  ?Also [place] pace s doe  ?Cough: none  ?Sleep: 2 pillows bed is flat ?SABA use: neb not even once a month  ? ?No obvious day to day or daytime variability or assoc excess/ purulent sputum or mucus plugs or hemoptysis or cp or chest tightness, subjective wheeze or overt sinus or hb symptoms.  ? ?Sleeping  without nocturnal  or early am exacerbation  of respiratory  c/o's or need for noct saba. Also denies any obvious fluctuation of symptoms with weather or environmental changes or other aggravating or alleviating factors except as outlined above  ? ?No unusual exposure hx or h/o childhood pna/ asthma or knowledge of premature birth. ? ?Current Allergies, Complete Past Medical History, Past Surgical History, Family History, and Social History were reviewed in [employer] record. ? ?ROS  The following are not active complaints unless bolded ?Hoarseness, sore throat, dysphagia, dental problems, itching, sneezing,  nasal congestion or discharge of excess mucus or purulent secretions, ear ache,   fever, chills, sweats, unintended wt loss or wt gain, classically pleuritic or exertional cp,  orthopnea pnd or arm/hand swelling  or leg swelling, presyncope, palpitations, abdominal pain, anorexia, nausea, vomiting, diarrhea  or change in bowel habits or change in bladder habits, change in stools or change in urine, dysuria, hematuria,  rash, arthralgias, visual complaints, headache, numbness,  weakness or ataxia or problems with walking or coordination,  change in mood or  memory. ?      ?   ? ?Past Medical History:  ?Diagnosis Date  ? Arthritis   ? Asthma   ? CKD (chronic kidney disease)   ? Essential hypertension   ? Mixed hyperlipidemia   ? 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 s/p TAVR with a 26 mm Edwards S3U via the TF approach by Dr. Burt Knack and Dr. Cyndia Bent.  ? Severe aortic stenosis   ? Symptomatic anemia   ? Presumed GI bleed January 2021 - she declined GI work-up  ? Type 2 diabetes mellitus ([place])   ? ? ?Outpatient Medications Prior to Visit  ?Medication Sig Dispense Refill  ? albuterol (PROVENTIL) (2.5 MG/3ML) 0.083% nebulizer solution Take 2.5 mg by nebulization every 6 (six) hours as needed for wheezing or shortness of breath.   2  ? allopurinol (ZYLOPRIM) 300 MG tablet Take 300 mg by mouth daily.    ? amLODipine (NORVASC) 5 MG tablet Take 1 tablet (5 mg total) by mouth daily. 90 tablet 3  ? aspirin 81 MG chewable tablet Chew 1 tablet (81 mg total) by mouth daily. 90 tablet 3  ? atorvastatin (LIPITOR) 20 MG tablet Take 20 mg by mouth every evening.     ? azithromycin (ZITHROMAX) 500 MG tablet Take a directed 6 tablet 3  ? B-D UF III MINI PEN NEEDLES 31G X 5 MM MISC SMARTSIG:1 Each SUB-Q Daily    ?  carvedilol (COREG) 6.25 MG tablet Take 1 tablet (6.25 mg total) by mouth 2 (two) times daily. 180 tablet 3  ? ciprofloxacin (CIPRO) 500 MG tablet Take 1 tablet (500 mg total) by mouth 2 (two) times daily. One po bid x 7 days 14 tablet 0  ? clopidogrel (PLAVIX) 75 MG tablet TAKE 1 TABLET IN THE MORNING WITH FOOD 90 tablet 0  ? ferrous sulfate 325 (65 FE) MG EC tablet Take 325 mg by mouth daily with breakfast.    ? furosemide (LASIX) 40 MG tablet TAKE 1 TABLET BY MOUTH ONCE DAILY. 90 tablet 3  ? gabapentin (NEURONTIN) 100 MG capsule Take 100-200 mg by mouth at bedtime.    ? hydrALAZINE (APRESOLINE) 50 MG tablet Take 1 tablet (50 mg total) by mouth 3 (three) times daily. 270  tablet 3  ? HYDROcodone-acetaminophen (NORCO/VICODIN) 5-325 MG tablet Take 1 tablet by mouth every 6 (six) hours as needed. 20 tablet 0  ? Insulin Detemir (LEVEMIR FLEXTOUCH) 100 UNIT/ML Pen Inject 30 Units into the skin at bedtime. 15 mL 11  ? loratadine (CLARITIN) 10 MG tablet Take 10 mg by mouth daily.    ? metFORMIN (GLUCOPHAGE) 500 MG tablet Take 1 tablet (500 mg total) by mouth 2 (two) times daily.    ? metroNIDAZOLE (FLAGYL) 500 MG tablet Take 500 mg by mouth 2 (two) times daily.    ? ?No facility-administered medications prior to visit.  ? ? ? ?Objective:  ?  ? ?BP 128/88 (BP Location: Right Wrist, Patient Position: Sitting)   Pulse 88   Temp 98.4 ?F (36.9 ?C) (Temporal)   Ht 5\' 5"  (1.651 m)   Wt 199 lb (90.3 kg)   SpO2 98% Comment: ra  BMI 33.12 kg/m?  ? ?SpO2: 98 % (ra) ? ?Amb obese bf nad  ? ? HEENT : pt wearing mask not removed for exam due to covid -19 concerns.  ? ? ?NECK :  without JVD/Nodes/TM/ nl carotid upstrokes bilaterally ? ? ?LUNGS: no acc muscle use,  Nl contour chest which is clear to A and P bilaterally without cough on insp or exp maneuvers ? ? ?CV:  RRR  no s3 or murmur or increase in P2, and no edema  ? ?ABD:  soft and nontender with nl inspiratory excursion in the supine position. No bruits or organomegaly appreciated, bowel sounds nl ? ?MS:  Nl gait/ ext warm without deformities, calf tenderness, cyanosis or clubbing ?No obvious joint restrictions  ? ?SKIN: warm and dry without lesions   ? ?NEURO:  alert, approp, nl sensorium with  no motor or cerebellar deficits apparent.  ? ? ?I personally reviewed images and agree with radiology impression as follows:  ? Chest CTa  03/05/21  ?1. No pulmonary embolism identified. ?2. Cardiomegaly. Distended pulmonary artery which may represent ?pulmonary artery hypertension. ?3. No significant change in size or appearance of a 1.6 x 0.8 cm ?pulmonary nodule in the right lower lobe. Consider follow-up CT in ?12 months. ?   ?Assessment  ? ?No  problem-specific Assessment & Plan notes found for this encounter. ? ? ? ? ?Christinia Gully, MD ?03/31/2021 ?   ?

## 2021-04-09 ENCOUNTER — Other Ambulatory Visit: Payer: Self-pay | Admitting: Physician Assistant

## 2021-04-21 ENCOUNTER — Other Ambulatory Visit: Payer: Self-pay

## 2021-04-21 ENCOUNTER — Emergency Department (HOSPITAL_COMMUNITY): Payer: Medicare Other

## 2021-04-21 ENCOUNTER — Encounter (HOSPITAL_COMMUNITY): Payer: Self-pay

## 2021-04-21 ENCOUNTER — Emergency Department (HOSPITAL_COMMUNITY)
Admission: EM | Admit: 2021-04-21 | Discharge: 2021-04-21 | Disposition: A | Discharge status: Home / Self Care | Payer: Medicare Other | Attending: Emergency Medicine | Admitting: Emergency Medicine

## 2021-04-21 DIAGNOSIS — M19012 Primary osteoarthritis, left shoulder: Secondary | ICD-10-CM | POA: Insufficient documentation

## 2021-04-21 DIAGNOSIS — I10 Essential (primary) hypertension: Secondary | ICD-10-CM | POA: Insufficient documentation

## 2021-04-21 DIAGNOSIS — Z79899 Other long term (current) drug therapy: Secondary | ICD-10-CM | POA: Diagnosis not present

## 2021-04-21 DIAGNOSIS — E119 Type 2 diabetes mellitus without complications: Secondary | ICD-10-CM | POA: Diagnosis not present

## 2021-04-21 DIAGNOSIS — J45909 Unspecified asthma, uncomplicated: Secondary | ICD-10-CM | POA: Diagnosis not present

## 2021-04-21 DIAGNOSIS — Z794 Long term (current) use of insulin: Secondary | ICD-10-CM | POA: Insufficient documentation

## 2021-04-21 DIAGNOSIS — Z7984 Long term (current) use of oral hypoglycemic drugs: Secondary | ICD-10-CM | POA: Diagnosis not present

## 2021-04-21 DIAGNOSIS — Z7982 Long term (current) use of aspirin: Secondary | ICD-10-CM | POA: Insufficient documentation

## 2021-04-21 DIAGNOSIS — M25512 Pain in left shoulder: Secondary | ICD-10-CM | POA: Diagnosis present

## 2021-04-21 DIAGNOSIS — Z7902 Long term (current) use of antithrombotics/antiplatelets: Secondary | ICD-10-CM | POA: Insufficient documentation

## 2021-04-21 MED ORDER — LIDOCAINE 5 % EX PTCH
1.0000 | MEDICATED_PATCH | CUTANEOUS | Status: DC
  Filled 2021-04-21: qty 1

## 2021-04-21 MED ORDER — LIDOCAINE 5 % EX PTCH
1.0000 | MEDICATED_PATCH | CUTANEOUS | 0 refills | Status: DC
Start: 2021-04-21 — End: 2021-07-02

## 2021-04-21 NOTE — ED Provider Notes (Signed)
?[facility] ?Provider Note ? ? ?CSN: [id number] ?Arrival date & time: 04/21/21  1004 ? ?  ? ?History ? ?Chief Complaint  ?Patient presents with  ? Shoulder Pain  ? ? ?Stephanie [name] is a 86 y.o. female with a history including type 2 diabetes, asthma, osteoarthritis, hypertension, history of aortic stenosis with TAVR on current Plavix presenting with left shoulder pain which has been present for approximately 3 weeks.  She describes pain along with a popping and cracking sensation in her left shoulder with movement, particularly if she tries to extend her arm over her head.  She denies any falls or injury.  She denies weakness in her arms, but does have occasional episodes of tingling that radiated into her left hand.  She has no complaints of neck pain or reduced neck range of motion.  She has taken Tylenol without relief of symptoms.  She denies chest pain or shortness of breath.  However, she does report feeling briefly like her heart was racing when she first woke this morning, however she woke lying on her left shoulder and was then severe pain until she rolled to her right side. ? ?The history is provided by the patient.  ? ?  ? ?Home Medications ?Prior to Admission medications   ?Medication Sig Start Date End Date Taking? Authorizing Provider  ?albuterol (PROVENTIL) (2.5 MG/3ML) 0.083% nebulizer solution Take 2.5 mg by nebulization every 6 (six) hours as needed for wheezing or shortness of breath.  10/20/17  Yes [provider]  ?allopurinol (ZYLOPRIM) 300 MG tablet Take 300 mg by mouth daily. 06/24/20  Yes [provider]  ?amLODipine (NORVASC) 5 MG tablet Take 1 tablet (5 mg total) by mouth daily. 04/24/20  Yes Strader, [place] M, PA-C  ?aspirin 81 MG chewable tablet CHEW AND SWALLOW (1) TABLET DAILY. 04/09/21  Yes Eileen Stanford, PA-C  ?carvedilol (COREG) 3.125 MG tablet Take 3.125 mg by mouth 2 (two) times daily with a meal.   Yes [provider]   ?clopidogrel (PLAVIX) 75 MG tablet TAKE 1 TABLET IN THE MORNING WITH FOOD 10/21/20  Yes Eileen Stanford, PA-C  ?ferrous sulfate 325 (65 FE) MG EC tablet Take 325 mg by mouth daily with breakfast.   Yes [provider]  ?furosemide (LASIX) 40 MG tablet TAKE 1 TABLET BY MOUTH ONCE DAILY. 03/10/21  Yes Satira Sark, MD  ?gabapentin (NEURONTIN) 100 MG capsule Take 100-200 mg by mouth at bedtime. 10/02/19  Yes [provider]  ?HYDROcodone-acetaminophen (NORCO/VICODIN) 5-325 MG tablet Take 1 tablet by mouth every 6 (six) hours as needed. 08/24/20  Yes Milton Ferguson, MD  ?Insulin Detemir (LEVEMIR FLEXTOUCH) 100 UNIT/ML Pen Inject 30 Units into the skin at bedtime. 02/10/19  Yes Johnson, Clanford L, MD  ?lidocaine (LIDODERM) 5 % Place 1 patch onto the skin daily. Remove & Discard patch within 12 hours or as directed by MD 04/21/21  Yes Molly Maselli, Almyra Free, PA-C  ?loratadine (CLARITIN) 10 MG tablet Take 10 mg by mouth daily. 06/23/20  Yes [provider]  ?metFORMIN (GLUCOPHAGE) 500 MG tablet Take 1 tablet (500 mg total) by mouth 2 (two) times daily. 07/02/20  Yes Eileen Stanford, PA-C  ?azithromycin (ZITHROMAX) 500 MG tablet Take a directed ?Patient not taking: Reported on 04/21/2021 07/17/20   Eileen Stanford, PA-C  ?carvedilol (COREG) 6.25 MG tablet Take 1 tablet (6.25 mg total) by mouth 2 (two) times daily. ?Patient not taking: Reported on 04/21/2021 12/09/20   Rozann Lesches  G, MD  ?ciprofloxacin (CIPRO) 500 MG tablet Take 1 tablet (500 mg total) by mouth 2 (two) times daily. One po bid x 7 days ?Patient not taking: Reported on 04/21/2021 08/24/20   Milton Ferguson, MD  ?hydrALAZINE (APRESOLINE) 50 MG tablet Take 1 tablet (50 mg total) by mouth 3 (three) times daily. ?Patient not taking: Reported on 04/21/2021 10/21/20   Eileen Stanford, PA-C  ?   ? ?Allergies    ?Amoxicillin, Nitrofurantoin, Penicillins, and Sulfa antibiotics   ? ?Review of Systems   ?Review of Systems  ?Constitutional:  Negative  for chills and fever.  ?HENT:  Negative for congestion and sore throat.   ?Eyes: Negative.   ?Respiratory:  Negative for chest tightness and shortness of breath.   ?Cardiovascular:  Positive for palpitations. Negative for chest pain.  ?Gastrointestinal:  Negative for abdominal pain and nausea.  ?Genitourinary: Negative.   ?Musculoskeletal:  Positive for arthralgias. Negative for joint swelling, neck pain and neck stiffness.  ?Skin: Negative.  Negative for rash and wound.  ?Neurological:  Negative for dizziness, weakness, light-headedness, numbness and headaches.  ?Psychiatric/Behavioral: Negative.    ?All other systems reviewed and are negative. ? ?Physical Exam ?Updated Vital Signs ?BP (!) 162/92   Pulse (!) 114   Temp 98.1 ?F (36.7 ?C) (Oral)   Resp 20   Ht 5\' 5"  (1.651 m)   Wt 102.1 kg   SpO2 97%   BMI 37.44 kg/m?  ?Physical Exam ?Vitals and nursing note reviewed.  ?Constitutional:   ?   Appearance: She is well-developed.  ?HENT:  ?   Head: Normocephalic and atraumatic.  ?Eyes:  ?   Conjunctiva/sclera: Conjunctivae normal.  ?Cardiovascular:  ?   Rate and Rhythm: Normal rate. Rhythm irregular.  ?   Heart sounds: Murmur heard.  ?Pulmonary:  ?   Effort: Pulmonary effort is normal. No respiratory distress.  ?   Breath sounds: Normal breath sounds. No wheezing.  ?Musculoskeletal:     ?   General: Normal range of motion.  ?   Left shoulder: Bony tenderness present. No swelling, deformity or crepitus.  ?   Cervical back: Normal range of motion.  ?   Comments: Patient has some discomfort with palpation at her anterior left humeral head.  She has pain which is elicited with extension above shoulder height.  No appreciable crepitus noted.  No pain with flex/extension at wrist and elbow.  ?Skin: ?   General: Skin is warm and dry.  ?Neurological:  ?   Mental Status: She is alert.  ? ? ?ED Results / Procedures / Treatments   ?Labs ?(all labs ordered are listed, but only abnormal results are displayed) ?Labs Reviewed -  No data to display ? ?EKG ?None ?ED ECG REPORT ? ? Date: 04/21/2021 ? Rate: 76 ? Rhythm: premature atrial contractions (PAC) ? QRS Axis: left ? Intervals: normal ? ST/T Wave abnormalities: nonspecific ST/T changes ? Conduction Disutrbances:none ? Narrative Interpretation:  ? Old EKG Reviewed: unchanged ? ?I have personally reviewed the EKG tracing and agree with the computerized printout as noted. ? ? ?Radiology ?DG Shoulder Left ? ?Result Date: 04/21/2021 ?CLINICAL DATA:  86 year old female with pain EXAM: LEFT SHOULDER - 2+ VIEW COMPARISON:  PET-CT 05/12/2020 FINDINGS: There is a geometric cortical density at the inferior acromion which appears to have been present on the comparison PET CT and is favored to be chronic and not acute bony fragment. Degenerative changes of the [facility] joint. Degenerative changes of the glenohumeral joint. Unremarkable  proximal humerus. No radiopaque foreign body. IMPRESSION: Negative for acute bony abnormality. Degenerative changes of the glenohumeral joint and acromioclavicular joint Electronically Signed   By: Corrie Mckusick D.O.   On: 04/21/2021 11:21   ? ?Procedures ?Procedures  ? ? ?Medications Ordered in ED ?Medications  ?lidocaine (LIDODERM) 5 % 1 patch (has no administration in time range)  ? ? ?ED Course/ Medical Decision Making/ A&P ?  ?                        ?Medical Decision Making ?Patient with a 3-week history of left shoulder pain which is reproducible with both active and passive range of motion.  Exam and history along with x-ray findings strongly suggest an orthopedic source with degenerative changes found on her imaging. ? ?Amount and/or Complexity of Data Reviewed ?Radiology: ordered. ?   Details: Left shoulder film revealing arthritis of the shoulder joint and AC joint. ?ECG/medicine tests: ordered and independent interpretation performed. ?   Details: EKG was obtained given patient's complaint including transient palpitation earlier this morning.  EKG shows frequent  PACs. ?Discussion of management or test interpretation with external provider(s): Discussed patient's symptoms and EKG findings with Dr. Harl Bowie, confirms frequent PVCs and not atrial fibrillation.  No indication for med

## 2021-04-21 NOTE — Discharge Instructions (Addendum)
You may continue using the pain patch you were given today if you find this to be helpful.  I recommend continuing to take arthritis strength Tylenol, take a dose every 6 hours which may offer better pain relief.  I see that you were prescribed some hydrocodone by your primary provider several days ago, continue taking this medication as needed.  You will benefit from seeing an orthopedic specialist.  Call Dr. Doreatha Martin for further evaluation of your arthritis and shoulder pain.  The EKG findings are not concerning, after discussion with your cardiologist you do not need any changes in your medications.  However I do recommend following up with Dr. Domenic Polite for an office visit.  Call for an appointment with him. ?

## 2021-04-21 NOTE — ED Triage Notes (Signed)
Pt presents to ED with complaints of left arm and shoulder pain x 3 weeks. Pt states when she moves her arm she feels it cracking.  ?

## 2021-04-28 NOTE — Progress Notes (Signed)
? ?Cardiology Office Note   ? ?Date:  05/06/2021  ? ?ID:  Stephanie [name], DOB 07-21-1935, MRN [id number] ? ? ?PCP:  [facility] ?  ?[place]  ?Cardiologist:  Rozann Lesches, MD   ?Advanced Practice Provider:  No care team member to display ?Electrophysiologist:  None  ? ?[id number]}  ? ?No chief complaint on file. ? ? ?History of Present Illness:  ?Stephanie [name] is a 86 y.o. 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   ? ?Patient was in the ER 04/21/2020 with left shoulder pain felt to be osteoarthritis and was having some palpitations.  EKG reviewed by Dr. Harl Bowie showed frequent PACs and no A-fib. ? ?Patient comes in for f/u. When she lays on her left side she has a lot of skipping and it scares her. This is all new for her.She doesn't have symptoms any other time. Her coreg  was increased by Dr. Domenic Polite to 6.25 mg bid. Patient's granddaughter says when they got a refill she was given the lower dose 3.125 mg. Pharmacy says last refill was for 6.25 mg 90 day supply in November. ? ? ? ?Past Medical History:  ?Diagnosis Date  ? Arthritis   ? Asthma   ? CKD (chronic kidney disease)   ? Essential hypertension   ? Mixed hyperlipidemia   ? 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 s/p TAVR with a 26 mm Edwards S3U via the TF approach by Dr. Burt Knack and Dr. Cyndia Bent.  ? Severe aortic stenosis   ? Symptomatic anemia   ? Presumed GI bleed January 2021 - she declined GI work-up  ? Type 2 diabetes mellitus ([place])   ? ? ?Past Surgical History:  ?Procedure Laterality Date  ? APPENDECTOMY    ? BACK SURGERY    ? CYST REMOVAL HAND    ? Right, hospital in [place]  ? INTRAOPERATIVE TRANSTHORACIC ECHOCARDIOGRAM Left 07/01/2020  ? Procedure: INTRAOPERATIVE TRANSTHORACIC ECHOCARDIOGRAM;   Surgeon: Sherren Mocha, MD;  Location: [facility];  Service: Open Heart Surgery;  Laterality: Left;  ? KNEE ARTHROSCOPY WITH MEDIAL MENISECTOMY Right 02/28/2012  ? Procedure: KNEE ARTHROSCOPY WITH MEDIAL MENISECTOMY;  Surgeon: Carole Civil, MD;  Location: [facility];  Service: Orthopedics;  Laterality: Right;  ? REPAIR KNEE LIGAMENT    ? RIGHT/LEFT HEART CATH AND CORONARY ANGIOGRAPHY N/A 04/10/2020  ? Procedure: RIGHT/LEFT HEART CATH AND CORONARY ANGIOGRAPHY;  Surgeon: Jettie Booze, MD;  Location: [facility];  Service: Cardiovascular;  Laterality: N/A;  ? TRANSCATHETER AORTIC VALVE REPLACEMENT, TRANSFEMORAL N/A 07/01/2020  ? Procedure: TRANSCATHETER AORTIC VALVE REPLACEMENT, TRANSFEMORAL;  Surgeon: Sherren Mocha, MD;  Location: [facility];  Service: Open Heart Surgery;  Laterality: N/A;  ? ULTRASOUND GUIDANCE FOR VASCULAR ACCESS Bilateral 07/01/2020  ? Procedure: ULTRASOUND GUIDANCE FOR VASCULAR ACCESS;  Surgeon: Sherren Mocha, MD;  Location: [facility];  Service: Open Heart Surgery;  Laterality: Bilateral;  ? ? ?Current Medications: ?Current Meds  ?Medication Sig  ? albuterol (PROVENTIL) (2.5 MG/3ML) 0.083% nebulizer solution Take 2.5 mg by nebulization every 6 (six) hours as needed for wheezing or shortness of breath.   ? allopurinol (ZYLOPRIM) 300 MG tablet Take 300 mg by mouth daily.  ? amLODipine (NORVASC) 5 MG tablet Take 1 tablet (5 mg total) by mouth daily.  ?  aspirin 81 MG chewable tablet CHEW AND SWALLOW (1) TABLET DAILY.  ? clopidogrel (PLAVIX) 75 MG tablet TAKE 1 TABLET IN THE MORNING WITH FOOD  ? ferrous sulfate 325 (65 FE) MG EC tablet Take 325 mg by mouth daily with breakfast.  ? furosemide (LASIX) 40 MG tablet TAKE 1 TABLET BY MOUTH ONCE DAILY.  ? gabapentin (NEURONTIN) 100 MG capsule Take 100-200 mg by mouth at bedtime.  ? HYDROcodone-acetaminophen (NORCO/VICODIN) 5-325 MG tablet Take 1 tablet by mouth every 6 (six) hours as needed.  ? Insulin Detemir (LEVEMIR FLEXTOUCH) 100 UNIT/ML Pen Inject  30 Units into the skin at bedtime.  ? lidocaine (LIDODERM) 5 % Place 1 patch onto the skin daily. Remove & Discard patch within 12 hours or as directed by MD  ? metFORMIN (GLUCOPHAGE) 500 MG tablet Take 1 tablet (500 mg total) by mouth 2 (two) times daily.  ? [DISCONTINUED] carvedilol (COREG) 6.25 MG tablet Take 1 tablet (6.25 mg total) by mouth 2 (two) times daily.  ?  ? ?Allergies:   Amoxicillin, Nitrofurantoin, Penicillins, and Sulfa antibiotics  ? ?Social History  ? ?Socioeconomic History  ? Marital status: Divorced  ?  Spouse name: Not on file  ? Number of children: 3  ? Years of education: Not on file  ? Highest education level: Not on file  ?Occupational History  ? Occupation: Retired-cook   ?Tobacco Use  ? Smoking status: Former  ?  Years: 7.00  ?  Types: Cigarettes  ? Smokeless tobacco: Never  ?Vaping Use  ? Vaping Use: Never used  ?Substance and Sexual Activity  ? Alcohol use: No  ? Drug use: No  ? Sexual activity: Never  ?Other Topics Concern  ? Not on file  ?Social History Narrative  ? ** Merged History Encounter **  ?    ? ?Social Determinants of Health  ? ?Financial Resource Strain: Not on file  ?Food Insecurity: Not on file  ?Transportation Needs: Not on file  ?Physical Activity: Not on file  ?Stress: Not on file  ?Social Connections: Not on file  ?  ? ?Family History:  The patient's  family history includes Arthritis in an other family member; Asthma in an other family member; Cancer in an other family member; Diabetes in her brother, father, mother, and another family member; Diverticulitis in her mother; Hypertension in her mother and sister; Lung disease in an other family member.  ? ?ROS:   ?Please see the history of present illness.    ?ROS All other systems reviewed and are negative. ? ? ?PHYSICAL EXAM:   ?VS:  BP 140/70   Pulse 65   Ht 5\' 5"  (1.651 m)   Wt 196 lb 6.4 oz (89.1 kg)   SpO2 96%   BMI 32.68 kg/m?   ?Physical Exam  ?GEN: Obese, in no acute distress  ?Neck: no JVD, carotid  bruits, or masses ?Cardiac:RRR; no murmurs, rubs, or gallops  ?Respiratory:  clear to auscultation bilaterally, normal work of breathing ?GI: soft, nontender, nondistended, + BS ?Ext: without cyanosis, clubbing, or edema, Good distal pulses bilaterally ?Neuro:  Alert and Oriented x 3 ?Psych: euthymic mood, full affect ? ?Wt Readings from Last 3 Encounters:  ?05/06/21 196 lb 6.4 oz (89.1 kg)  ?04/21/21 225 lb (102.1 kg)  ?03/31/21 199 lb (90.3 kg)  ?  ? ? ?Studies/Labs Reviewed:  ? ?EKG:  EKG is not ordered today.  ? ?Recent Labs: ?07/02/2020: Magnesium 1.8 ?11/17/2020: B Natriuretic Peptide 118.0 ?03/05/2021: ALT 11; BUN 29;  Creatinine, Ser 1.35; Hemoglobin 12.3; Platelets 180; Potassium 4.3; Sodium 134  ? ?Lipid Panel ?   ?Component Value Date/Time  ? CHOL 184 04/11/2020 0945  ? TRIG 283 (H) 04/11/2020 0945  ? HDL 46 04/11/2020 0945  ? CHOLHDL 4.0 04/11/2020 0945  ? VLDL 57 (H) 04/11/2020 0945  ? [place] 81 04/11/2020 0945  ? ? ?Additional studies/ records that were reviewed today include:  ?Echocardiogram 08/07/2020: ? 1. Mild global reduction in LV systolic function; s/p TAVR (mean gradient  ?9 mmHg, peak velocity 2.1 m/s; DI 0.38; no AI); compared to 07/01/20, LV  ?function slightly reduced.  ? 2. Left ventricular ejection fraction, by estimation, is 45 to 50%. The  ?left ventricle has mildly decreased function. The left ventricle  ?demonstrates global hypokinesis. There is severe left ventricular  ?hypertrophy. Left ventricular diastolic parameters  ? are consistent with Grade I diastolic dysfunction (impaired relaxation).  ? 3. Right ventricular systolic function is normal. The right ventricular  ?size is normal.  ? 4. Left atrial size was mildly dilated.  ? 5. The mitral valve is normal in structure. No evidence of mitral valve  ?regurgitation. No evidence of mitral stenosis.  ? 6. The aortic valve has been repaired/replaced. Aortic valve  ?regurgitation is not visualized. No aortic stenosis is present. There is  a  ?26 mm Sapien prosthetic (TAVR) valve present in the aortic position.  ?Procedure Date: 07/01/2020.  ? 7. The inferior vena cava is normal in size with greater than 50%  ?respiratory variability, suggesting

## 2021-05-04 ENCOUNTER — Telehealth: Payer: Self-pay | Admitting: Cardiology

## 2021-05-04 NOTE — Telephone Encounter (Signed)
Pt states that she will come in fasting for her visit.  ?

## 2021-05-04 NOTE — Telephone Encounter (Signed)
Patient's daughter calling to clarify if the patient needs to fast for her appointment 4/19. She states the patient said she was told to not eat, but I did not see any fasting lab orders. ?

## 2021-05-06 ENCOUNTER — Other Ambulatory Visit (HOSPITAL_COMMUNITY)
Admission: RE | Admit: 2021-05-06 | Discharge: 2021-05-06 | Disposition: A | Discharge status: Home / Self Care | Payer: Medicare Other | Source: Ambulatory Visit | Attending: Physician Assistant | Admitting: Physician Assistant

## 2021-05-06 ENCOUNTER — Encounter: Payer: Self-pay | Admitting: Physician Assistant

## 2021-05-06 ENCOUNTER — Ambulatory Visit (INDEPENDENT_AMBULATORY_CARE_PROVIDER_SITE_OTHER): Payer: Medicare Other | Admitting: Physician Assistant

## 2021-05-06 VITALS — BP 140/70 | HR 65 | Ht 65.0 in | Wt 196.4 lb

## 2021-05-06 DIAGNOSIS — I429 Cardiomyopathy, unspecified: Secondary | ICD-10-CM

## 2021-05-06 DIAGNOSIS — Z952 Presence of prosthetic heart valve: Secondary | ICD-10-CM | POA: Diagnosis present

## 2021-05-06 DIAGNOSIS — R002 Palpitations: Secondary | ICD-10-CM | POA: Diagnosis not present

## 2021-05-06 DIAGNOSIS — N1832 Chronic kidney disease, stage 3b: Secondary | ICD-10-CM

## 2021-05-06 LAB — BASIC METABOLIC PANEL
Anion gap: 9 (ref 5–15)
BUN: 25 mg/dL — ABNORMAL HIGH (ref 8–23)
CO2: 30 mmol/L (ref 22–32)
Calcium: 10.5 mg/dL — ABNORMAL HIGH (ref 8.9–10.3)
Chloride: 101 mmol/L (ref 98–111)
Creatinine, Ser: 1.2 mg/dL — ABNORMAL HIGH (ref 0.44–1.00)
GFR, Estimated: 45 mL/min — ABNORMAL LOW (ref >60)
Glucose, Bld: 114 mg/dL — ABNORMAL HIGH (ref 70–99)
Potassium: 4.6 mmol/L (ref 3.5–5.1)
Sodium: 140 mmol/L (ref 135–145)

## 2021-05-06 LAB — CBC
HCT: 40.4 % (ref 36.0–46.0)
Hemoglobin: 12.5 g/dL (ref 12.0–15.0)
MCH: 27.2 pg (ref 26.0–34.0)
MCHC: 30.9 g/dL (ref 30.0–36.0)
MCV: 88 fL (ref 80.0–100.0)
Platelets: 193 10*3/uL (ref 150–400)
RBC: 4.59 MIL/uL (ref 3.87–5.11)
RDW: 15.3 % (ref 11.5–15.5)
WBC: 8.7 10*3/uL (ref 4.0–10.5)
nRBC: 0 % (ref 0.0–0.2)

## 2021-05-06 MED ORDER — CARVEDILOL 6.25 MG PO TABS: 6.2500 mg | ORAL_TABLET | Freq: Two times a day (BID) | ORAL | 3 refills | Status: DC

## 2021-05-06 NOTE — Patient Instructions (Signed)
Labwork: ?BMET ?CBC ? ?Follow-Up: ?Follow up with Dr. Domenic Polite in 4 months. ? ?Any Other Special Instructions Will Be Listed Below (If Applicable). ? ?If you are still having palpitations after taking the Coreg 6.25 mg tablets, please call our office- [phone].  ? ? ?If you need a refill on your cardiac medications before your next appointment, please call your pharmacy. ? ?

## 2021-05-12 ENCOUNTER — Other Ambulatory Visit: Payer: Self-pay | Admitting: Physician Assistant

## 2021-06-08 ENCOUNTER — Telehealth (INDEPENDENT_AMBULATORY_CARE_PROVIDER_SITE_OTHER): Payer: Self-pay | Admitting: Nurse Practitioner

## 2021-06-08 NOTE — Telephone Encounter (Signed)
Daughter called in regards to patient stomping her toe and it look infected.  Patient wants to come in to have it looked at.  Please advise.

## 2021-07-02 ENCOUNTER — Encounter (HOSPITAL_COMMUNITY): Payer: Self-pay

## 2021-07-02 ENCOUNTER — Observation Stay (HOSPITAL_COMMUNITY)
Admission: EM | Admit: 2021-07-02 | Discharge: 2021-07-03 | Disposition: A | Discharge status: Home Health | Payer: Medicare PPO | Attending: Internal Medicine | Admitting: Internal Medicine

## 2021-07-02 ENCOUNTER — Emergency Department (HOSPITAL_COMMUNITY): Payer: Medicare PPO

## 2021-07-02 ENCOUNTER — Observation Stay (HOSPITAL_BASED_OUTPATIENT_CLINIC_OR_DEPARTMENT_OTHER): Payer: Medicare PPO

## 2021-07-02 ENCOUNTER — Other Ambulatory Visit: Payer: Self-pay

## 2021-07-02 DIAGNOSIS — E1140 Type 2 diabetes mellitus with diabetic neuropathy, unspecified: Secondary | ICD-10-CM | POA: Insufficient documentation

## 2021-07-02 DIAGNOSIS — K5732 Diverticulitis of large intestine without perforation or abscess without bleeding: Secondary | ICD-10-CM | POA: Diagnosis not present

## 2021-07-02 DIAGNOSIS — W1830XA Fall on same level, unspecified, initial encounter: Secondary | ICD-10-CM | POA: Insufficient documentation

## 2021-07-02 DIAGNOSIS — D649 Anemia, unspecified: Secondary | ICD-10-CM | POA: Diagnosis present

## 2021-07-02 DIAGNOSIS — Y92002 Bathroom of unspecified non-institutional (private) residence single-family (private) house as the place of occurrence of the external cause: Secondary | ICD-10-CM | POA: Diagnosis not present

## 2021-07-02 DIAGNOSIS — I5033 Acute on chronic diastolic (congestive) heart failure: Secondary | ICD-10-CM | POA: Diagnosis not present

## 2021-07-02 DIAGNOSIS — Z952 Presence of prosthetic heart valve: Secondary | ICD-10-CM

## 2021-07-02 DIAGNOSIS — I4891 Unspecified atrial fibrillation: Secondary | ICD-10-CM | POA: Diagnosis not present

## 2021-07-02 DIAGNOSIS — J45909 Unspecified asthma, uncomplicated: Secondary | ICD-10-CM | POA: Diagnosis not present

## 2021-07-02 DIAGNOSIS — J323 Chronic sphenoidal sinusitis: Secondary | ICD-10-CM | POA: Insufficient documentation

## 2021-07-02 DIAGNOSIS — Z7984 Long term (current) use of oral hypoglycemic drugs: Secondary | ICD-10-CM | POA: Insufficient documentation

## 2021-07-02 DIAGNOSIS — R519 Headache, unspecified: Secondary | ICD-10-CM | POA: Diagnosis not present

## 2021-07-02 DIAGNOSIS — Z794 Long term (current) use of insulin: Secondary | ICD-10-CM | POA: Diagnosis not present

## 2021-07-02 DIAGNOSIS — I10 Essential (primary) hypertension: Secondary | ICD-10-CM | POA: Diagnosis present

## 2021-07-02 DIAGNOSIS — Z87891 Personal history of nicotine dependence: Secondary | ICD-10-CM | POA: Diagnosis not present

## 2021-07-02 DIAGNOSIS — Z6836 Body mass index (BMI) 36.0-36.9, adult: Secondary | ICD-10-CM | POA: Insufficient documentation

## 2021-07-02 DIAGNOSIS — Z7902 Long term (current) use of antithrombotics/antiplatelets: Secondary | ICD-10-CM | POA: Insufficient documentation

## 2021-07-02 DIAGNOSIS — K5792 Diverticulitis of intestine, part unspecified, without perforation or abscess without bleeding: Secondary | ICD-10-CM | POA: Diagnosis not present

## 2021-07-02 DIAGNOSIS — E119 Type 2 diabetes mellitus without complications: Secondary | ICD-10-CM

## 2021-07-02 DIAGNOSIS — N1831 Chronic kidney disease, stage 3a: Secondary | ICD-10-CM | POA: Diagnosis not present

## 2021-07-02 DIAGNOSIS — I130 Hypertensive heart and chronic kidney disease with heart failure and stage 1 through stage 4 chronic kidney disease, or unspecified chronic kidney disease: Secondary | ICD-10-CM | POA: Insufficient documentation

## 2021-07-02 DIAGNOSIS — Z79899 Other long term (current) drug therapy: Secondary | ICD-10-CM | POA: Insufficient documentation

## 2021-07-02 DIAGNOSIS — Z7982 Long term (current) use of aspirin: Secondary | ICD-10-CM | POA: Insufficient documentation

## 2021-07-02 DIAGNOSIS — I480 Paroxysmal atrial fibrillation: Secondary | ICD-10-CM

## 2021-07-02 DIAGNOSIS — E782 Mixed hyperlipidemia: Secondary | ICD-10-CM | POA: Diagnosis present

## 2021-07-02 DIAGNOSIS — E66812 Obesity, class 2: Secondary | ICD-10-CM

## 2021-07-02 DIAGNOSIS — R0600 Dyspnea, unspecified: Secondary | ICD-10-CM

## 2021-07-02 DIAGNOSIS — R1032 Left lower quadrant pain: Secondary | ICD-10-CM | POA: Diagnosis present

## 2021-07-02 DIAGNOSIS — R0902 Hypoxemia: Secondary | ICD-10-CM | POA: Insufficient documentation

## 2021-07-02 LAB — COMPREHENSIVE METABOLIC PANEL
ALT: 8 U/L (ref 0–44)
AST: 13 U/L — ABNORMAL LOW (ref 15–41)
Albumin: 3.8 g/dL (ref 3.5–5.0)
Alkaline Phosphatase: 164 U/L — ABNORMAL HIGH (ref 38–126)
Anion gap: 6 (ref 5–15)
BUN: 17 mg/dL (ref 8–23)
CO2: 28 mmol/L (ref 22–32)
Calcium: 10.4 mg/dL — ABNORMAL HIGH (ref 8.9–10.3)
Chloride: 105 mmol/L (ref 98–111)
Creatinine, Ser: 1.12 mg/dL — ABNORMAL HIGH (ref 0.44–1.00)
GFR, Estimated: 49 mL/min — ABNORMAL LOW (ref >60)
Glucose, Bld: 109 mg/dL — ABNORMAL HIGH (ref 70–99)
Potassium: 4.5 mmol/L (ref 3.5–5.1)
Sodium: 139 mmol/L (ref 135–145)
Total Bilirubin: 0.4 mg/dL (ref 0.3–1.2)
Total Protein: 7.5 g/dL (ref 6.5–8.1)

## 2021-07-02 LAB — ECHOCARDIOGRAM COMPLETE
AR max vel: 1.69 cm2
AV Area VTI: 1.83 cm2
AV Area mean vel: 1.6 cm2
AV Mean grad: 8.3 mmHg
AV Peak grad: 17.6 mmHg
Ao pk vel: 2.1 m/s
Area-P 1/2: 1.46 cm2
Calc EF: 51.9 %
Height: 65 in
MV VTI: 1.36 cm2
S' Lateral: 2.6 cm
Single Plane A2C EF: 51.6 %
Single Plane A4C EF: 52.8 %
Weight: 3093.49 oz

## 2021-07-02 LAB — CBC
HCT: 40.6 % (ref 36.0–46.0)
Hemoglobin: 12.8 g/dL (ref 12.0–15.0)
MCH: 27.4 pg (ref 26.0–34.0)
MCHC: 31.5 g/dL (ref 30.0–36.0)
MCV: 86.9 fL (ref 80.0–100.0)
Platelets: 230 10*3/uL (ref 150–400)
RBC: 4.67 MIL/uL (ref 3.87–5.11)
RDW: 15.6 % — ABNORMAL HIGH (ref 11.5–15.5)
WBC: 11.2 10*3/uL — ABNORMAL HIGH (ref 4.0–10.5)
nRBC: 0 % (ref 0.0–0.2)

## 2021-07-02 LAB — LIPASE, BLOOD: Lipase: 40 U/L (ref 11–51)

## 2021-07-02 LAB — URINALYSIS, ROUTINE W REFLEX MICROSCOPIC
Bacteria, UA: NONE SEEN
Bilirubin Urine: NEGATIVE
Glucose, UA: NEGATIVE mg/dL
Ketones, ur: NEGATIVE mg/dL
Nitrite: NEGATIVE
Protein, ur: 100 mg/dL — AB
Specific Gravity, Urine: 1.011 (ref 1.005–1.030)
pH: 6 (ref 5.0–8.0)

## 2021-07-02 LAB — HEMOGLOBIN A1C
Hgb A1c MFr Bld: 7.5 % — ABNORMAL HIGH (ref 4.8–5.6)
Mean Plasma Glucose: 168.55 mg/dL

## 2021-07-02 LAB — GLUCOSE, CAPILLARY: Glucose-Capillary: 127 mg/dL — ABNORMAL HIGH (ref 70–99)

## 2021-07-02 LAB — TSH: TSH: 3.2 u[IU]/mL (ref 0.350–4.500)

## 2021-07-02 LAB — TROPONIN I (HIGH SENSITIVITY)
Troponin I (High Sensitivity): 15 ng/L (ref <18)
Troponin I (High Sensitivity): 11 ng/L (ref <18)

## 2021-07-02 LAB — BRAIN NATRIURETIC PEPTIDE: B Natriuretic Peptide: 285 pg/mL — ABNORMAL HIGH (ref 0.0–100.0)

## 2021-07-02 LAB — CBG MONITORING, ED: Glucose-Capillary: 103 mg/dL — ABNORMAL HIGH (ref 70–99)

## 2021-07-02 MED ORDER — CIPROFLOXACIN HCL 250 MG PO TABS
500.0000 mg | ORAL_TABLET | Freq: Two times a day (BID) | ORAL | Status: DC
  Administered 2021-07-02 – 2021-07-03 (×3): 500 mg via ORAL
  Filled 2021-07-02 (×3): qty 2

## 2021-07-02 MED ORDER — INSULIN ASPART 100 UNIT/ML IJ SOLN: 0.0000 [IU] | Freq: Every day | INTRAMUSCULAR | Status: DC

## 2021-07-02 MED ORDER — MOXIFLOXACIN HCL 400 MG PO TABS: 400.0000 mg | ORAL_TABLET | Freq: Every day | ORAL | 0 refills | Status: AC

## 2021-07-02 MED ORDER — MELOXICAM 15 MG PO TABS: 15.0000 mg | ORAL_TABLET | Freq: Every day | ORAL | Status: DC

## 2021-07-02 MED ORDER — CLOPIDOGREL BISULFATE 75 MG PO TABS: 75.0000 mg | ORAL_TABLET | Freq: Every day | ORAL | Status: DC

## 2021-07-02 MED ORDER — TRAMADOL HCL 50 MG PO TABS
50.0000 mg | ORAL_TABLET | Freq: Every day | ORAL | Status: DC | PRN
  Administered 2021-07-02: 50 mg via ORAL
  Filled 2021-07-02: qty 1

## 2021-07-02 MED ORDER — IPRATROPIUM-ALBUTEROL 0.5-2.5 (3) MG/3ML IN SOLN
3.0000 mL | Freq: Once | RESPIRATORY_TRACT | Status: AC
  Administered 2021-07-02: 3 mL via RESPIRATORY_TRACT
  Filled 2021-07-02: qty 3

## 2021-07-02 MED ORDER — FENTANYL CITRATE PF 50 MCG/ML IJ SOSY
50.0000 ug | PREFILLED_SYRINGE | Freq: Once | INTRAMUSCULAR | Status: AC
  Administered 2021-07-02: 50 ug via INTRAVENOUS
  Filled 2021-07-02: qty 1

## 2021-07-02 MED ORDER — INSULIN DETEMIR 100 UNIT/ML ~~LOC~~ SOLN
30.0000 [IU] | Freq: Every day | SUBCUTANEOUS | Status: DC
  Administered 2021-07-02: 30 [IU] via SUBCUTANEOUS
  Filled 2021-07-02 (×2): qty 0.3

## 2021-07-02 MED ORDER — IOHEXOL 300 MG/ML  SOLN
75.0000 mL | Freq: Once | INTRAMUSCULAR | Status: AC | PRN
  Administered 2021-07-02: 75 mL via INTRAVENOUS

## 2021-07-02 MED ORDER — ASPIRIN 81 MG PO CHEW: 81.0000 mg | CHEWABLE_TABLET | Freq: Every day | ORAL | Status: DC

## 2021-07-02 MED ORDER — DICLOFENAC SODIUM 1 % EX GEL: 2.0000 g | Freq: Four times a day (QID) | CUTANEOUS | Status: DC | PRN

## 2021-07-02 MED ORDER — ONDANSETRON HCL 4 MG/2ML IJ SOLN
4.0000 mg | Freq: Four times a day (QID) | INTRAMUSCULAR | Status: DC | PRN
  Administered 2021-07-03: 4 mg via INTRAVENOUS
  Filled 2021-07-02: qty 2

## 2021-07-02 MED ORDER — ACETAMINOPHEN 325 MG PO TABS: 650.0000 mg | ORAL_TABLET | Freq: Four times a day (QID) | ORAL | Status: DC | PRN

## 2021-07-02 MED ORDER — FUROSEMIDE 10 MG/ML IJ SOLN
40.0000 mg | Freq: Once | INTRAMUSCULAR | Status: AC
  Administered 2021-07-02: 40 mg via INTRAVENOUS
  Filled 2021-07-02: qty 4

## 2021-07-02 MED ORDER — METRONIDAZOLE 500 MG PO TABS: 500.0000 mg | ORAL_TABLET | Freq: Once | ORAL | Status: DC

## 2021-07-02 MED ORDER — SODIUM CHLORIDE 0.9 % IV BOLUS
1000.0000 mL | Freq: Once | INTRAVENOUS | Status: AC
  Administered 2021-07-02: 1000 mL via INTRAVENOUS

## 2021-07-02 MED ORDER — HEPARIN SODIUM (PORCINE) 5000 UNIT/ML IJ SOLN: 5000.0000 [IU] | Freq: Three times a day (TID) | INTRAMUSCULAR | Status: DC

## 2021-07-02 MED ORDER — FUROSEMIDE 10 MG/ML IJ SOLN
40.0000 mg | Freq: Two times a day (BID) | INTRAMUSCULAR | Status: DC
  Administered 2021-07-02 – 2021-07-03 (×2): 40 mg via INTRAVENOUS
  Filled 2021-07-02 (×2): qty 4

## 2021-07-02 MED ORDER — ONDANSETRON HCL 4 MG/2ML IJ SOLN
4.0000 mg | Freq: Once | INTRAMUSCULAR | Status: AC
  Administered 2021-07-02: 4 mg via INTRAVENOUS
  Filled 2021-07-02: qty 2

## 2021-07-02 MED ORDER — METRONIDAZOLE 500 MG PO TABS
500.0000 mg | ORAL_TABLET | Freq: Three times a day (TID) | ORAL | Status: DC
  Administered 2021-07-02 – 2021-07-03 (×4): 500 mg via ORAL
  Filled 2021-07-02 (×4): qty 1

## 2021-07-02 MED ORDER — APIXABAN 5 MG PO TABS
5.0000 mg | ORAL_TABLET | Freq: Two times a day (BID) | ORAL | Status: DC
  Administered 2021-07-02 – 2021-07-03 (×3): 5 mg via ORAL
  Filled 2021-07-02 (×3): qty 1

## 2021-07-02 MED ORDER — CARVEDILOL 3.125 MG PO TABS
6.2500 mg | ORAL_TABLET | Freq: Two times a day (BID) | ORAL | Status: DC
  Administered 2021-07-02 – 2021-07-03 (×2): 6.25 mg via ORAL
  Filled 2021-07-02 (×2): qty 2

## 2021-07-02 MED ORDER — GABAPENTIN 100 MG PO CAPS
100.0000 mg | ORAL_CAPSULE | Freq: Every day | ORAL | Status: DC
  Administered 2021-07-02: 100 mg via ORAL
  Filled 2021-07-02: qty 1

## 2021-07-02 MED ORDER — ONDANSETRON HCL 4 MG PO TABS: 4.0000 mg | ORAL_TABLET | Freq: Four times a day (QID) | ORAL | Status: DC | PRN

## 2021-07-02 MED ORDER — ALLOPURINOL 300 MG PO TABS
300.0000 mg | ORAL_TABLET | Freq: Every day | ORAL | Status: DC
  Administered 2021-07-03: 300 mg via ORAL
  Filled 2021-07-02: qty 1

## 2021-07-02 MED ORDER — FERROUS SULFATE 325 (65 FE) MG PO TABS
325.0000 mg | ORAL_TABLET | Freq: Every day | ORAL | Status: DC
  Administered 2021-07-03: 325 mg via ORAL
  Filled 2021-07-02: qty 1

## 2021-07-02 MED ORDER — ACETAMINOPHEN 650 MG RE SUPP: 650.0000 mg | Freq: Four times a day (QID) | RECTAL | Status: DC | PRN

## 2021-07-02 MED ORDER — ALBUTEROL SULFATE (2.5 MG/3ML) 0.083% IN NEBU: 2.5000 mg | INHALATION_SOLUTION | Freq: Four times a day (QID) | RESPIRATORY_TRACT | Status: DC | PRN

## 2021-07-02 MED ORDER — INSULIN ASPART 100 UNIT/ML IJ SOLN
0.0000 [IU] | Freq: Three times a day (TID) | INTRAMUSCULAR | Status: DC
  Administered 2021-07-02: 2 [IU] via SUBCUTANEOUS
  Administered 2021-07-03: 3 [IU] via SUBCUTANEOUS

## 2021-07-02 NOTE — ED Notes (Signed)
Patient sitting in chair at bedside eating lunch at this time.

## 2021-07-02 NOTE — ED Notes (Signed)
Patient placed on RA following nebulizer treatment per EDP order.

## 2021-07-02 NOTE — H&P (Addendum)
History and Physical    Stephanie [name] NFA:[id number] DOB: Aug 21, 1935 DOA: 07/02/2021  PCP: [facility]   Patient coming from: Home  Chief Complaint: Left lower quadrant abdominal pain  HPI: Stephanie [name] is a 86 y.o. female with medical history significant for hypertension, dyslipidemia, type 2 diabetes with neuropathy, obesity, iron deficiency anemia, CKD stage IIIa, asthma, and prior TAVR who presented to the ED with worsening left lower quadrant abdominal pain over the last 2 weeks.  This was associated with some nausea, but no vomiting and she denies any diarrhea or increased urinary frequency.  She actually states that she has been more constipated and has only had 2 bowel movements in the last 1 week.  She was unable to sleep last night which prompted the ED visit via EMS.  No fevers, chills, chest pain, or shortness of breath noted.   ED Course: Stable vital signs with mild leukocytosis of 11,200.  Creatinine near baseline at 1.12 and calcium 10.4.  Patient was started on IV antibiotics and was planned to discharge home, however she began to have an oxygen requirement and was noted to have some volume overload for which diuresis was initiated.  Review of Systems: Reviewed as noted above, otherwise negative.  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 Burt Knack and Dr. Cyndia Bent.   Severe aortic stenosis    Symptomatic anemia    Presumed GI bleed January 2021 - she declined GI work-up   Type 2 diabetes mellitus ([place])     Past Surgical History:  Procedure Laterality Date   APPENDECTOMY     BACK SURGERY     CYST REMOVAL HAND     Right, hospital in [place] ECHOCARDIOGRAM Left 07/01/2020   Procedure:  INTRAOPERATIVE TRANSTHORACIC ECHOCARDIOGRAM;  Surgeon: Sherren Mocha, MD;  Location: [facility];  Service: Open Heart Surgery;  Laterality: Left;   KNEE ARTHROSCOPY WITH MEDIAL MENISECTOMY Right 02/28/2012   Procedure: KNEE ARTHROSCOPY WITH MEDIAL MENISECTOMY;  Surgeon: Carole Civil, MD;  Location: [facility];  Service: Orthopedics;  Laterality: Right;   REPAIR KNEE LIGAMENT     RIGHT/LEFT HEART CATH AND CORONARY ANGIOGRAPHY N/A 04/10/2020   Procedure: RIGHT/LEFT HEART CATH AND CORONARY ANGIOGRAPHY;  Surgeon: Jettie Booze, MD;  Location: [facility];  Service: Cardiovascular;  Laterality: N/A;   TRANSCATHETER AORTIC VALVE REPLACEMENT, TRANSFEMORAL N/A 07/01/2020   Procedure: TRANSCATHETER AORTIC VALVE REPLACEMENT, TRANSFEMORAL;  Surgeon: Sherren Mocha, MD;  Location: [facility];  Service: Open Heart Surgery;  Laterality: N/A;   ULTRASOUND GUIDANCE FOR VASCULAR ACCESS Bilateral 07/01/2020   Procedure: ULTRASOUND GUIDANCE FOR VASCULAR ACCESS;  Surgeon: Sherren Mocha, MD;  Location: [facility];  Service: Open Heart Surgery;  Laterality: Bilateral;     reports that she has quit smoking. Her smoking use included cigarettes. She has never used smokeless tobacco. She reports that she does not drink alcohol and does not use drugs.  Allergies  Allergen Reactions   Amoxicillin Swelling    Tongue swelling    Nitrofurantoin Nausea And Vomiting    Other reaction(s): vomiting Other reaction(s): vomiting   Penicillins Swelling    Has patient had a PCN reaction causing immediate rash,  facial/tongue/throat swelling, SOB or lightheadedness with hypotension: No Has patient had a PCN reaction causing severe rash involving mucus membranes or skin necrosis: No Has patient had a PCN reaction that required hospitalization: No Has patient had a PCN reaction occurring within the last 10 years: No If all of the above answers are "NO", then may proceed with Cephalosporin use.    Sulfa Antibiotics Swelling     Lip swelling    Family History  Problem Relation Age of Onset   Lung disease Other    Cancer Other    Arthritis Other    Asthma Other    Diabetes Other    Diabetes Mother    Hypertension Mother    Diverticulitis Mother    Diabetes Father    Hypertension Sister    Diabetes Brother    Colon cancer Neg Hx     Prior to Admission medications   Medication Sig Start Date End Date Taking? Authorizing Provider  albuterol (PROVENTIL) (2.5 MG/3ML) 0.083% nebulizer solution Take 2.5 mg by nebulization every 6 (six) hours as needed for wheezing or shortness of breath.  10/20/17  Yes [provider]  allopurinol (ZYLOPRIM) 300 MG tablet Take 300 mg by mouth daily. 06/24/20  Yes [provider]  aspirin 81 MG chewable tablet CHEW AND SWALLOW (1) TABLET DAILY. Patient taking differently: Chew 81 mg by mouth daily. 04/09/21  Yes Eileen Stanford, PA-C  carvedilol (COREG) 6.25 MG tablet Take 1 tablet (6.25 mg total) by mouth 2 (two) times daily. 05/06/21  Yes Imogene Burn, PA-C  ciprofloxacin (CIPRO) 250 MG tablet Take 250 mg by mouth 2 (two) times daily. 07/01/21  Yes [provider]  clopidogrel (PLAVIX) 75 MG tablet TAKE 1 TABLET IN THE MORNING WITH FOOD Patient taking differently: Take 75 mg by mouth daily. 05/12/21  Yes Satira Sark, MD  diclofenac Sodium (VOLTAREN) 1 % GEL Apply 2 g topically 4 (four) times daily as needed (pain). 05/20/21  Yes [provider]  ferrous sulfate 325 (65 FE) MG EC tablet Take 325 mg by mouth daily with breakfast.   Yes [provider]  furosemide (LASIX) 40 MG tablet TAKE 1 TABLET BY MOUTH ONCE DAILY. 03/10/21  Yes Satira Sark, MD  gabapentin (NEURONTIN) 100 MG capsule Take 100-200 mg by mouth at bedtime. 10/02/19  Yes [provider]  Insulin Detemir (LEVEMIR FLEXTOUCH) 100 UNIT/ML Pen Inject 30 Units into the skin at bedtime. 02/10/19  Yes Johnson, Clanford L, MD  meloxicam (MOBIC) 15 MG tablet Take  15 mg by mouth daily. 05/20/21  Yes [provider]  metFORMIN (GLUCOPHAGE) 500 MG tablet Take 1 tablet (500 mg total) by mouth 2 (two) times daily. 07/02/20  Yes Eileen Stanford, PA-C  moxifloxacin (AVELOX) 400 MG tablet Take 1 tablet (400 mg total) by mouth daily at 8 pm for 5 days. 07/02/21 07/07/21 Yes Kommor, [place], MD  traMADol (ULTRAM) 50 MG tablet Take 50 mg by mouth daily as needed for moderate pain. 05/20/21  Yes [provider]    Physical Exam: Vitals:   07/02/21 0900 07/02/21 1009 07/02/21 1015 07/02/21 1100  BP:      Pulse: 69 63 80 82  Resp: (!) 21 (!) 24 (!) 25 18  Temp:      SpO2: 96% 97% 98% 95%  Weight:      Height:        Constitutional: NAD, calm, comfortable, obese Vitals:   07/02/21 0900 07/02/21 1009 07/02/21 1015  07/02/21 1100  BP:      Pulse: 69 63 80 82  Resp: (!) 21 (!) 24 (!) 25 18  Temp:      SpO2: 96% 97% 98% 95%  Weight:      Height:       Eyes: lids and conjunctivae normal Neck: normal, supple Respiratory: clear to auscultation bilaterally. Normal respiratory effort. No accessory muscle use.  Cardiovascular: Regular rate and rhythm, no murmurs. Abdomen: Minimal tenderness to palpation of the left lower quadrant, no distention. Bowel sounds positive.  Musculoskeletal:  No edema. Skin: no rashes, lesions, ulcers.  Psychiatric: Flat affect  Labs on Admission: I have personally reviewed following labs and imaging studies  CBC: Recent Labs  Lab 07/02/21 0350  WBC 11.2*  HGB 12.8  HCT 40.6  MCV 86.9  PLT 361   Basic Metabolic Panel: Recent Labs  Lab 07/02/21 0350  NA 139  K 4.5  CL 105  CO2 28  GLUCOSE 109*  BUN 17  CREATININE 1.12*  CALCIUM 10.4*   GFR: Estimated Creatinine Clearance: 41.9 mL/min (A) (by C-G formula based on SCr of 1.12 mg/dL (H)). Liver Function Tests: Recent Labs  Lab 07/02/21 0350  AST 13*  ALT 8  ALKPHOS 164*  BILITOT 0.4  PROT 7.5  ALBUMIN 3.8   Recent Labs  Lab  07/02/21 0350  LIPASE 40   No results for input(s): "AMMONIA" in the last 168 hours. Coagulation Profile: No results for input(s): "INR", "PROTIME" in the last 168 hours. Cardiac Enzymes: No results for input(s): "CKTOTAL", "CKMB", "CKMBINDEX", "TROPONINI" in the last 168 hours. BNP (last 3 results) No results for input(s): "PROBNP" in the last 8760 hours. HbA1C: No results for input(s): "HGBA1C" in the last 72 hours. CBG: No results for input(s): "GLUCAP" in the last 168 hours. Lipid Profile: No results for input(s): "CHOL", "HDL", "LDLCALC", "TRIG", "CHOLHDL", "LDLDIRECT" in the last 72 hours. Thyroid Function Tests: No results for input(s): "TSH", "T4TOTAL", "FREET4", "T3FREE", "THYROIDAB" in the last 72 hours. Anemia Panel: No results for input(s): "VITAMINB12", "FOLATE", "FERRITIN", "TIBC", "IRON", "RETICCTPCT" in the last 72 hours. Urine analysis:    Component Value Date/Time   COLORURINE YELLOW 07/02/2021 0350   APPEARANCEUR CLEAR 07/02/2021 0350   APPEARANCEUR Hazy (A) 05/23/2020 1119   LABSPEC 1.011 07/02/2021 0350   PHURINE 6.0 07/02/2021 0350   GLUCOSEU NEGATIVE 07/02/2021 0350   HGBUR SMALL (A) 07/02/2021 0350   BILIRUBINUR NEGATIVE 07/02/2021 0350   BILIRUBINUR Negative 05/23/2020 1119   KETONESUR NEGATIVE 07/02/2021 0350   PROTEINUR 100 (A) 07/02/2021 0350   UROBILINOGEN 0.2 04/25/2014 0936   NITRITE NEGATIVE 07/02/2021 0350   LEUKOCYTESUR TRACE (A) 07/02/2021 0350    Radiological Exams on Admission: DG Chest 2 View  Result Date: 07/02/2021 CLINICAL DATA:  86 year old female with history of hypoxia. EXAM: CHEST - 2 VIEW COMPARISON:  Chest x-ray 03/05/2021. FINDINGS: Lung volumes are low. Widespread but patchy areas of interstitial prominence an ill-defined opacities are noted in the lungs bilaterally, most evident in the lung bases. Trace bilateral pleural effusions. No pneumothorax. Cephalization of the pulmonary vasculature. Mild cardiomegaly. Upper  mediastinal contours are within normal limits. Atherosclerotic calcifications in the thoracic aorta. Status post TAVR. IMPRESSION: 1. The appearance the chest suggests mild congestive heart failure, as above. 2. Aortic atherosclerosis. 3. Status post TAVR. Electronically Signed   By: Vinnie Langton M.D.   On: 07/02/2021 08:02   CT Abdomen Pelvis W Contrast  Result Date: 07/02/2021 CLINICAL DATA:  86 year old female with  2 weeks of left lower quadrant abdominal pain. EXAM: CT ABDOMEN AND PELVIS WITH CONTRAST TECHNIQUE: Multidetector CT imaging of the abdomen and pelvis was performed using the standard protocol following bolus administration of intravenous contrast. RADIATION DOSE REDUCTION: This exam was performed according to the departmental dose-optimization program which includes automated exposure control, adjustment of the mA and/or kV according to patient size and/or use of iterative reconstruction technique. CONTRAST:  5mL OMNIPAQUE IOHEXOL 300 MG/ML  SOLN COMPARISON:  CT abdomen 11/25/2020. CTA chest abdomen and pelvis 04/25/2020. FINDINGS: Lower chest: Sequelae of TAVR. Cardiomegaly. No pericardial effusion. No pleural effusion. Partially visible 14 mm right lung nodule, appears stable since 04/25/2020. And this was associated with low metabolic activity on PET-CT last year. Lung base additional atelectasis and scarring. No pleural effusion. Hepatobiliary: Multiple circumscribed hepatic low-density lesions appear stable and most compatible with benign etiology such as hepatic cysts (no follow-up imaging recommended). Gallbladder is mildly distended, but no cholelithiasis or pericholecystic inflammation is identified. Bile ducts appear stable from last year. Pancreas: Partial pancreatic atrophy appears stable. Spleen: Negative. Adrenals/Urinary Tract: Stable left adrenal gland adenoma (benign) as documented by PET-CT . Negative right adrenal gland. Multiple low-density renal lesions appear stable from  last year (no follow-up imaging recommended). Nonobstructed kidneys with symmetric renal contrast excretion. Diminutive, negative bladder. Stomach/Bowel: Severe diverticulosis of the large bowel from the descending through the proximal rectal colon segments. Questionable mild focus of active inflammation in the left lower quadrant near the junction of the descending and sigmoid segments on coronal image 64, different compared to the CTA last year. Superimposed retained stool. Diverticulosis also at the cecum. No other active inflammation identified. Flocculated material in the small bowel but no dilated small bowel loops. Mostly decompressed stomach. Duodenum is decompressed. No free air or free fluid. Vascular/Lymphatic: Tortuous aorta. Aortoiliac calcified atherosclerosis. Major arterial structures in the abdomen and pelvis appear patent. Portal venous system appears patent. No lymphadenopathy identified. Reproductive: Within normal limits. Other: No pelvic free fluid.  Chronic pelvic phleboliths. Musculoskeletal: Chronic lumbar decompression and fusion with underlying advanced chronic spine degeneration. Severe adjacent segment disease L1-L2. No acute osseous abnormality identified. Chronic ventral abdominal wall subcutaneous soft tissue thickening and/or stranding appears 3 stable and benign. 7 IMPRESSION: 1. Severe large bowel diverticulosis with questionable mild active inflammation in the left lower quadrant near the junction of the descending and sigmoid segments. 2. But no other acute or inflammatory process identified in the abdomen or pelvis. 3. A 14 mm right lung nodule appears stable since 04/25/2020 and was evaluated with PET-CT last year (please see that report). 4. Aortic Atherosclerosis (ICD10-I70.0). Benign left adrenal gland adenoma, hepato-renal cystic disease. Electronically Signed   By: Genevie Ann M.D.   On: 07/02/2021 07:07    EKG: Independently reviewed. SR 88bpm.  Assessment/Plan Principal  Problem:   Diverticulitis Active Problems:   Essential hypertension, benign   Mixed hyperlipidemia   Class 2 severe obesity due to excess calories with serious comorbidity and body mass index (BMI) of 36.0 to 36.9 in adult [facility])   Symptomatic anemia   Asthma   DM (diabetes mellitus) (HCC)   S/P TAVR (transcatheter aortic valve replacement)    Acute diverticulitis -Continue ciprofloxacin and Flagyl -No signs of abscess noted on CT  Mild hypoxemia likely related to acute on chronic diastolic CHF exacerbation -Chest x-ray with vascular congestion and BNP elevation noted -Prior 2D echocardiogram 7/22 with LVEF 45-50% with grade 1 diastolic dysfunction -Lasix given in the ED and will  continue for now -Repeat 2D echocardiogram  New atrial arrhythmia -Currently rate controlled, monitor on telemetry -2D echocardiogram and TSH pending -CHA2DS2-VASc score of 6 or more, start anticoagulation  Mild asthma -No wheezing/bronchospasms noted -Breathing treatments as needed   Hypertension -Continue home blood pressure agents  Anemia -Likely iron deficiency and patient on ferrous sulfate at home -Monitor CBC and continue same  Type 2 diabetes with neuropathy -Continue on SSI and check A1c -Gabapentin for neuropathy  Gout -No acute flare noted at this time -Continue allopurinol  CKD stage IIIa -Currently at baseline -Continue to monitor with ongoing diuresis  Obesity -BMI 32.65 -Lifestyle changes outpatient  DVT prophylaxis: Eliquis Code Status: Full Family Communication: None at bedside Disposition Plan:Admit for diuresis and tx with antibiotics Consults called:None Admission status: Obs, Tele  Severity of Illness: The appropriate patient status for this patient is OBSERVATION. Observation status is judged to be reasonable and necessary in order to provide the required intensity of service to ensure the patient's safety. The patient's presenting symptoms, physical exam  findings, and initial radiographic and laboratory data in the context of their medical condition is felt to place them at decreased risk for further clinical deterioration. Furthermore, it is anticipated that the patient will be medically stable for discharge from the hospital within 2 midnights of admission.    Eugenio Dollins D Manuella Ghazi DO [facility]  If 7PM-7AM, please contact night-coverage [email or web address]  07/02/2021, 11:25 AM

## 2021-07-02 NOTE — ED Notes (Signed)
Lab in room.

## 2021-07-02 NOTE — ED Notes (Signed)
Upon assessment patient has [place] out of nose. Patient educated on importance of keeping [place] in her nose due to her oxygen levels. Patient verbalized understanding.

## 2021-07-02 NOTE — Progress Notes (Incomplete)
*  PRELIMINARY RESULTS* Echocardiogram 2D Echocardiogram has been performed.  Stephanie [name] 07/02/2021, 3:20 PM

## 2021-07-02 NOTE — ED Provider Notes (Signed)
[facility]  Provider Note  CSN: [id number] Arrival date & time: 07/02/21 0326  History Chief Complaint  Patient presents with   Abdominal Pain    Stephanie [name] is a 86 y.o. female reports a week or two of gradually worsening LLQ abdominal pain, associated with nausea, urinary frequency but no vomiting or diarrhea. She saw her PCP who thought it might be gall stones although she has not had any right sided symptoms. She was unable to sleep tonight due to pain prompting her to call EMS.    Home Medications Prior to Admission medications   Medication Sig Start Date End Date Taking? Authorizing Provider  albuterol (PROVENTIL) (2.5 MG/3ML) 0.083% nebulizer solution Take 2.5 mg by nebulization every 6 (six) hours as needed for wheezing or shortness of breath.  10/20/17   [provider]  allopurinol (ZYLOPRIM) 300 MG tablet Take 300 mg by mouth daily. 06/24/20   [provider]  amLODipine (NORVASC) 5 MG tablet Take 1 tablet (5 mg total) by mouth daily. 04/24/20   Strader, Fransisco Hertz, PA-C  aspirin 81 MG chewable tablet CHEW AND SWALLOW (1) TABLET DAILY. 04/09/21   Eileen Stanford, PA-C  azithromycin (ZITHROMAX) 500 MG tablet Take a directed Patient not taking: Reported on 04/21/2021 07/17/20   Eileen Stanford, PA-C  carvedilol (COREG) 6.25 MG tablet Take 1 tablet (6.25 mg total) by mouth 2 (two) times daily. 05/06/21   Imogene Burn, PA-C  ciprofloxacin (CIPRO) 500 MG tablet Take 1 tablet (500 mg total) by mouth 2 (two) times daily. One po bid x 7 days Patient not taking: Reported on 04/21/2021 08/24/20   Milton Ferguson, MD  clopidogrel (PLAVIX) 75 MG tablet TAKE 1 TABLET IN THE MORNING WITH FOOD 05/12/21   Satira Sark, MD  ferrous sulfate 325 (65 FE) MG EC tablet Take 325 mg by mouth daily with breakfast.    [provider]  furosemide (LASIX) 40 MG tablet TAKE 1 TABLET BY MOUTH ONCE DAILY. 03/10/21   Satira Sark, MD  gabapentin  (NEURONTIN) 100 MG capsule Take 100-200 mg by mouth at bedtime. 10/02/19   [provider]  hydrALAZINE (APRESOLINE) 50 MG tablet Take 1 tablet (50 mg total) by mouth 3 (three) times daily. Patient not taking: Reported on 04/21/2021 10/21/20   Eileen Stanford, PA-C  HYDROcodone-acetaminophen (NORCO/VICODIN) 5-325 MG tablet Take 1 tablet by mouth every 6 (six) hours as needed. 08/24/20   Milton Ferguson, MD  Insulin Detemir (LEVEMIR FLEXTOUCH) 100 UNIT/ML Pen Inject 30 Units into the skin at bedtime. 02/10/19   Johnson, Clanford L, MD  lidocaine (LIDODERM) 5 % Place 1 patch onto the skin daily. Remove & Discard patch within 12 hours or as directed by MD 04/21/21   Evalee Jefferson, PA-C  loratadine (CLARITIN) 10 MG tablet Take 10 mg by mouth daily. Patient not taking: Reported on 05/06/2021 06/23/20   [provider]  metFORMIN (GLUCOPHAGE) 500 MG tablet Take 1 tablet (500 mg total) by mouth 2 (two) times daily. 07/02/20   Eileen Stanford, PA-C     Allergies    Amoxicillin, Nitrofurantoin, Penicillins, and Sulfa antibiotics   Review of Systems   Review of Systems Please see HPI for pertinent positives and negatives  Physical Exam BP (!) 170/70   Pulse 81   Temp 97.9 F (36.6 C)   Resp (!) 27   Ht 5\' 5"  (1.651 m)   Wt 89 kg   SpO2 97%  BMI 32.65 kg/m   Physical Exam Vitals and nursing note reviewed.  Constitutional:      Appearance: Normal appearance.  HENT:     Head: Normocephalic and atraumatic.     Nose: Nose normal.     Mouth/Throat:     Mouth: Mucous membranes are moist.  Eyes:     Extraocular Movements: Extraocular movements intact.     Conjunctiva/sclera: Conjunctivae normal.  Cardiovascular:     Rate and Rhythm: Normal rate.  Pulmonary:     Effort: Pulmonary effort is normal.     Breath sounds: Normal breath sounds.  Abdominal:     General: Abdomen is flat.     Palpations: Abdomen is soft.     Tenderness: There is abdominal tenderness in the left  lower quadrant. There is guarding. Negative signs include Murphy's sign and McBurney's sign.  Musculoskeletal:        General: No swelling. Normal range of motion.     Cervical back: Neck supple.  Skin:    General: Skin is warm and dry.  Neurological:     General: No focal deficit present.     Mental Status: She is alert.  Psychiatric:        Mood and Affect: Mood normal.     ED Results / Procedures / Treatments   EKG EKG Interpretation  Date/Time:  Thursday July 02 2021 03:42:09 EDT Ventricular Rate:  88 PR Interval:  59 QRS Duration: 86 QT Interval:  396 QTC Calculation: 418 R Axis:   -44 Text Interpretation: Sinus rhythm Supraventricular bigeminy Left axis deviation Anterior infarct, old Baseline wander in lead(s) II aVR aVF Since last tracing Premature atrial complexes More frequent Confirmed by Calvert Cantor [id number]) on 07/02/2021 3:52:09 AM  Procedures Procedures  Medications Ordered in the ED Medications  fentaNYL (SUBLIMAZE) injection 50 mcg (50 mcg Intravenous Given 07/02/21 0506)  ondansetron (ZOFRAN) injection 4 mg (4 mg Intravenous Given 07/02/21 0506)  sodium chloride 0.9 % bolus 1,000 mL (0 mLs Intravenous Stopped 07/02/21 0609)  iohexol (OMNIPAQUE) 300 MG/ML solution 75 mL (75 mLs Intravenous Contrast Given 07/02/21 9528)    Initial Impression and Plan  Patient here with progressive LLQ pain, will check labs, send for CT to eval cause, suspect diverticulitis. Pain/nausea meds for comfort.   ED Course   Clinical Course as of 07/02/21 0714  Thu Jul 02, 2021  0452 CBC with mild leukocytosis. UA with 11-20 WBC but no bacteria and only trace LE. She has had urinary frequency but no dysuria.  [CS]  0615 CMP and lipase are unremarkable. Trop is normal.  [CS]  [phone] Care of the patient signed out pending CT.  [CS]    Clinical Course User Index [CS] Truddie Hidden, MD     MDM Rules/Calculators/A&P Medical Decision Making Problems  Addressed: Diverticulitis: acute illness or injury  Amount and/or Complexity of Data Reviewed Labs: ordered. Radiology: ordered.  Risk Prescription drug management.    Final Clinical Impression(s) / ED Diagnoses Final diagnoses:  Diverticulitis    Rx / DC Orders ED Discharge Orders     None        Truddie Hidden, MD 07/02/21 [phone]

## 2021-07-02 NOTE — ED Notes (Signed)
Patient returned from [place]. Requested to sit on side of bed. Patient on side of bed with cardiac monitoring, pulse ox, and BP cycling q 30 min.

## 2021-07-02 NOTE — ED Notes (Addendum)
Upon assessment patients O2 sats 86% on RA. EDP made aware. See new orders. Patient placed on 2L [place]. O2 sats 96% on 2L [place].

## 2021-07-02 NOTE — ED Triage Notes (Signed)
[facility] from home. Cc of left lower abdominal pain for appx 2 weeks. Went to pcp apt and was told she might have gallstones. No n/v/d. Says she hasnt been able to sleep good from the pain

## 2021-07-02 NOTE — ED Notes (Signed)
Patient placed on 2L [place].

## 2021-07-02 NOTE — Discharge Instructions (Addendum)

## 2021-07-02 NOTE — ED Notes (Addendum)
Patient ambulated to restroom with unsteady gait. Patient placed in wheelchair and transported to X-ray.

## 2021-07-02 NOTE — ED Provider Notes (Signed)
  Physical Exam  BP (!) 166/85   Pulse 69   Temp 97.9 F (36.6 C)   Resp (!) 21   Ht 5\' 5"  (1.651 m)   Wt 89 kg   SpO2 96%   BMI 32.65 kg/m   Physical Exam Vitals and nursing note reviewed.  Constitutional:      General: She is not in acute distress.    Appearance: She is well-developed.  HENT:     Head: Normocephalic and atraumatic.  Eyes:     Conjunctiva/sclera: Conjunctivae normal.  Cardiovascular:     Rate and Rhythm: Normal rate and regular rhythm.     Heart sounds: No murmur heard. Pulmonary:     Effort: Pulmonary effort is normal. No respiratory distress.     Breath sounds: Normal breath sounds.  Abdominal:     Palpations: Abdomen is soft.     Tenderness: There is no abdominal tenderness.  Musculoskeletal:        General: No swelling.     Cervical back: Neck supple.  Skin:    General: Skin is warm and dry.     Capillary Refill: Capillary refill takes less than 2 seconds.  Neurological:     Mental Status: She is alert.  Psychiatric:        Mood and Affect: Mood normal.     Procedures  .Critical Care  Performed by: Teressa Lower, MD Authorized by: Teressa Lower, MD   Critical care provider statement:    Critical care time (minutes):  30   Critical care was necessary to treat or prevent imminent or life-threatening deterioration of the following conditions:  Respiratory failure   Critical care was time spent personally by me on the following activities:  Development of treatment plan with patient or surrogate, discussions with consultants, evaluation of patient's response to treatment, examination of patient, ordering and review of laboratory studies, ordering and review of radiographic studies, ordering and performing treatments and interventions, pulse oximetry, re-evaluation of patient's condition and review of old charts   ED Course / MDM   Clinical Course as of 07/02/21 0909  Thu Jul 02, 2021  [place] CBC with mild leukocytosis. UA with 11-20 WBC  but no bacteria and only trace LE. She has had urinary frequency but no dysuria.  [CS]  0615 CMP and lipase are unremarkable. Trop is normal.  [CS]  [phone] Care of the patient signed out pending CT.  [CS]    Clinical Course User Index [CS] Truddie Hidden, MD   Medical Decision Making Amount and/or Complexity of Data Reviewed Labs: ordered. Radiology: ordered.  Risk Prescription drug management. Decision regarding hospitalization.   Patient received in handoff.  Left lower quadrant abdominal pain and CT imaging concerning for severe diverticulosis with possible diverticulitis.  I had initial plans on discharging the patient home with moxifloxacin given her multiple drug allergies, but upon attempting to discharge the patient, we took her off of the oxygen placed overnight and she dropped her oxygen saturations to 86%.  Given her history of asthma we attempted a DuoNeb to see if this would improve her oxygenation but unfortunately, this did not improve her oxygen saturations.  Chest x-ray was obtained that shows mild CHF and Lasix was administered.  Given patient's new oxygen requirement, she will require admission for diuresis.       Teressa Lower, MD 07/02/21 [phone]

## 2021-07-02 NOTE — ED Notes (Signed)
Ambulatory to restroom

## 2021-07-03 ENCOUNTER — Emergency Department (HOSPITAL_COMMUNITY): Payer: Medicare PPO

## 2021-07-03 ENCOUNTER — Other Ambulatory Visit: Payer: Self-pay

## 2021-07-03 ENCOUNTER — Encounter (HOSPITAL_COMMUNITY): Payer: Self-pay

## 2021-07-03 ENCOUNTER — Emergency Department (HOSPITAL_COMMUNITY)
Admission: EM | Admit: 2021-07-03 | Discharge: 2021-07-04 | Disposition: A | Discharge status: Home / Self Care | Payer: Medicare PPO | Source: Home / Self Care | Attending: Emergency Medicine | Admitting: Emergency Medicine

## 2021-07-03 DIAGNOSIS — Z7984 Long term (current) use of oral hypoglycemic drugs: Secondary | ICD-10-CM | POA: Insufficient documentation

## 2021-07-03 DIAGNOSIS — W1830XA Fall on same level, unspecified, initial encounter: Secondary | ICD-10-CM | POA: Insufficient documentation

## 2021-07-03 DIAGNOSIS — E119 Type 2 diabetes mellitus without complications: Secondary | ICD-10-CM | POA: Insufficient documentation

## 2021-07-03 DIAGNOSIS — R519 Headache, unspecified: Secondary | ICD-10-CM | POA: Insufficient documentation

## 2021-07-03 DIAGNOSIS — R1031 Right lower quadrant pain: Secondary | ICD-10-CM | POA: Insufficient documentation

## 2021-07-03 DIAGNOSIS — K5792 Diverticulitis of intestine, part unspecified, without perforation or abscess without bleeding: Secondary | ICD-10-CM | POA: Diagnosis not present

## 2021-07-03 DIAGNOSIS — Y92002 Bathroom of unspecified non-institutional (private) residence single-family (private) house as the place of occurrence of the external cause: Secondary | ICD-10-CM | POA: Insufficient documentation

## 2021-07-03 DIAGNOSIS — W19XXXA Unspecified fall, initial encounter: Secondary | ICD-10-CM

## 2021-07-03 DIAGNOSIS — Z794 Long term (current) use of insulin: Secondary | ICD-10-CM | POA: Insufficient documentation

## 2021-07-03 LAB — CBC WITH DIFFERENTIAL/PLATELET
Abs Immature Granulocytes: 0.07 10*3/uL (ref 0.00–0.07)
Basophils Absolute: 0 10*3/uL (ref 0.0–0.1)
Basophils Relative: 0 %
Eosinophils Absolute: 0 10*3/uL (ref 0.0–0.5)
Eosinophils Relative: 0 %
HCT: 39.5 % (ref 36.0–46.0)
Hemoglobin: 12.3 g/dL (ref 12.0–15.0)
Immature Granulocytes: 1 %
Lymphocytes Relative: 7 %
Lymphs Abs: 0.8 10*3/uL (ref 0.7–4.0)
MCH: 27.6 pg (ref 26.0–34.0)
MCHC: 31.1 g/dL (ref 30.0–36.0)
MCV: 88.8 fL (ref 80.0–100.0)
Monocytes Absolute: 0.7 10*3/uL (ref 0.1–1.0)
Monocytes Relative: 6 %
Neutro Abs: 9.3 10*3/uL — ABNORMAL HIGH (ref 1.7–7.7)
Neutrophils Relative %: 86 %
Platelets: 203 10*3/uL (ref 150–400)
RBC: 4.45 MIL/uL (ref 3.87–5.11)
RDW: 15.3 % (ref 11.5–15.5)
WBC: 10.9 10*3/uL — ABNORMAL HIGH (ref 4.0–10.5)
nRBC: 0 % (ref 0.0–0.2)

## 2021-07-03 LAB — COMPREHENSIVE METABOLIC PANEL
ALT: 6 U/L (ref 0–44)
ALT: 9 U/L (ref 0–44)
AST: 10 U/L — ABNORMAL LOW (ref 15–41)
AST: 13 U/L — ABNORMAL LOW (ref 15–41)
Albumin: 3.6 g/dL (ref 3.5–5.0)
Albumin: 3.9 g/dL (ref 3.5–5.0)
Alkaline Phosphatase: 149 U/L — ABNORMAL HIGH (ref 38–126)
Alkaline Phosphatase: 155 U/L — ABNORMAL HIGH (ref 38–126)
Anion gap: 7 (ref 5–15)
Anion gap: 9 (ref 5–15)
BUN: 15 mg/dL (ref 8–23)
BUN: 23 mg/dL (ref 8–23)
CO2: 28 mmol/L (ref 22–32)
CO2: 32 mmol/L (ref 22–32)
Calcium: 9.9 mg/dL (ref 8.9–10.3)
Calcium: 10.2 mg/dL (ref 8.9–10.3)
Chloride: 102 mmol/L (ref 98–111)
Chloride: 98 mmol/L (ref 98–111)
Creatinine, Ser: 1.18 mg/dL — ABNORMAL HIGH (ref 0.44–1.00)
Creatinine, Ser: 1.35 mg/dL — ABNORMAL HIGH (ref 0.44–1.00)
GFR, Estimated: 46 mL/min — ABNORMAL LOW (ref >60)
GFR, Estimated: 39 mL/min — ABNORMAL LOW (ref >60)
Glucose, Bld: 123 mg/dL — ABNORMAL HIGH (ref 70–99)
Glucose, Bld: 142 mg/dL — ABNORMAL HIGH (ref 70–99)
Potassium: 4.6 mmol/L (ref 3.5–5.1)
Potassium: 5 mmol/L (ref 3.5–5.1)
Sodium: 137 mmol/L (ref 135–145)
Sodium: 139 mmol/L (ref 135–145)
Total Bilirubin: 0.8 mg/dL (ref 0.3–1.2)
Total Bilirubin: 0.9 mg/dL (ref 0.3–1.2)
Total Protein: 6.7 g/dL (ref 6.5–8.1)
Total Protein: 7.1 g/dL (ref 6.5–8.1)

## 2021-07-03 LAB — URINALYSIS, ROUTINE W REFLEX MICROSCOPIC
Bacteria, UA: NONE SEEN
Bilirubin Urine: NEGATIVE
Glucose, UA: NEGATIVE mg/dL
Ketones, ur: 5 mg/dL — AB
Leukocytes,Ua: NEGATIVE
Nitrite: NEGATIVE
Protein, ur: 30 mg/dL — AB
Specific Gravity, Urine: >1.046 — ABNORMAL HIGH (ref 1.005–1.030)
pH: 5 (ref 5.0–8.0)

## 2021-07-03 LAB — CBC
HCT: 39.5 % (ref 36.0–46.0)
Hemoglobin: 11.9 g/dL — ABNORMAL LOW (ref 12.0–15.0)
MCH: 27 pg (ref 26.0–34.0)
MCHC: 30.1 g/dL (ref 30.0–36.0)
MCV: 89.8 fL (ref 80.0–100.0)
Platelets: 191 10*3/uL (ref 150–400)
RBC: 4.4 MIL/uL (ref 3.87–5.11)
RDW: 15.3 % (ref 11.5–15.5)
WBC: 9.4 10*3/uL (ref 4.0–10.5)
nRBC: 0 % (ref 0.0–0.2)

## 2021-07-03 LAB — GLUCOSE, CAPILLARY
Glucose-Capillary: 152 mg/dL — ABNORMAL HIGH (ref 70–99)
Glucose-Capillary: 165 mg/dL — ABNORMAL HIGH (ref 70–99)

## 2021-07-03 LAB — MAGNESIUM: Magnesium: 1.8 mg/dL (ref 1.7–2.4)

## 2021-07-03 MED ORDER — APIXABAN 5 MG PO TABS
5.0000 mg | ORAL_TABLET | Freq: Two times a day (BID) | ORAL | 0 refills | Status: DC
Start: 2021-07-03 — End: 2022-04-07

## 2021-07-03 MED ORDER — METRONIDAZOLE 500 MG PO TABS: 500.0000 mg | ORAL_TABLET | Freq: Three times a day (TID) | ORAL | 0 refills | Status: AC

## 2021-07-03 MED ORDER — IOHEXOL 300 MG/ML  SOLN
100.0000 mL | Freq: Once | INTRAMUSCULAR | Status: AC | PRN
Start: 2021-07-03 — End: 2021-07-03
  Administered 2021-07-03: 100 mL via INTRAVENOUS

## 2021-07-03 MED ORDER — SODIUM CHLORIDE 0.9 % IV BOLUS
250.0000 mL | Freq: Once | INTRAVENOUS | Status: AC
  Administered 2021-07-03: 250 mL via INTRAVENOUS

## 2021-07-03 NOTE — ED Notes (Signed)
Pt took off O2 and stats dropped O2 replaced on pt - up to 92% Family at bedside state pt is restless, keeps removing monitor cords, and she is " Talking out of her head" RN made aware

## 2021-07-03 NOTE — Progress Notes (Addendum)
  Transition of Care [facility]) Screening Note   Patient Details  Name: Stephanie [name] Date of Birth: 1935/11/17   Transition [facility]) CM/SW Contact:    Iona Beard, [place] Phone Number: 07/03/2021, 10:47 AM  Addendum 11:30am: CSW updated that pt is requesting HH at D/C. CSW sent referral to [facility] with [place]. [place] referral has been accepted by [facility]. MD placed [place] orders.   Transition of Care Department [facility]) has reviewed patient and no TOC needs have been identified at this time. We will continue to monitor patient advancement through interdisciplinary progression rounds. If new patient transition needs arise, please place a TOC consult.

## 2021-07-03 NOTE — ED Provider Notes (Incomplete)
[facility] EMERGENCY DEPARTMENT Provider Note   CSN: [id number] Arrival date & time: 07/03/21  1854     History {Add pertinent medical, surgical, social history, OB history to HPI:1} Chief Complaint  Patient presents with   Stephanie [name]    Stephanie [name] is a 86 y.o. female.  Patient with diverticulitis.  She fell and hurt her right flank area.   Fall       Home Medications Prior to Admission medications   Medication Sig Start Date End Date Taking? Authorizing Provider  albuterol (PROVENTIL) (2.5 MG/3ML) 0.083% nebulizer solution Take 2.5 mg by nebulization every 6 (six) hours as needed for wheezing or shortness of breath.  10/20/17  Yes [provider]  allopurinol (ZYLOPRIM) 300 MG tablet Take 300 mg by mouth daily. 06/24/20  Yes [provider]  apixaban (ELIQUIS) 5 MG TABS tablet Take 1 tablet (5 mg total) by mouth 2 (two) times daily. 07/03/21 09/01/21 Yes Shah, Pratik D, DO  carvedilol (COREG) 6.25 MG tablet Take 1 tablet (6.25 mg total) by mouth 2 (two) times daily. 05/06/21  Yes Imogene Burn, PA-C  diclofenac Sodium (VOLTAREN) 1 % GEL Apply 2 g topically 4 (four) times daily as needed (pain). 05/20/21  Yes [provider]  ferrous sulfate 325 (65 FE) MG EC tablet Take 325 mg by mouth daily with breakfast.   Yes [provider]  furosemide (LASIX) 40 MG tablet TAKE 1 TABLET BY MOUTH ONCE DAILY. 03/10/21  Yes Satira Sark, MD  gabapentin (NEURONTIN) 100 MG capsule Take 100-200 mg by mouth at bedtime. 10/02/19  Yes [provider]  Insulin Detemir (LEVEMIR FLEXTOUCH) 100 UNIT/ML Pen Inject 30 Units into the skin at bedtime. 02/10/19  Yes Johnson, Clanford L, MD  meloxicam (MOBIC) 15 MG tablet Take 15 mg by mouth daily. 05/20/21  Yes [provider]  metFORMIN (GLUCOPHAGE) 500 MG tablet Take 1 tablet (500 mg total) by mouth 2 (two) times daily. 07/02/20  Yes Eileen Stanford, PA-C  metroNIDAZOLE (FLAGYL) 500 MG tablet Take 1  tablet (500 mg total) by mouth every 8 (eight) hours for 7 days. 07/03/21 07/10/21 Yes Shah, Pratik D, DO  traMADol (ULTRAM) 50 MG tablet Take 50 mg by mouth daily as needed for moderate pain. 05/20/21  Yes [provider]  moxifloxacin (AVELOX) 400 MG tablet Take 1 tablet (400 mg total) by mouth daily at 8 pm for 5 days. Patient not taking: Reported on 07/03/2021 07/02/21 07/07/21  Kommor, Debe Coder, MD      Allergies    Amoxicillin, Nitrofurantoin, Penicillins, and Sulfa antibiotics    Review of Systems   Review of Systems  Physical Exam Updated Vital Signs BP (!) 152/78   Pulse 81   Temp 100 F (37.8 C) (Oral)   Resp 20   Ht 5\' 5"  (1.651 m)   Wt 87.6 kg   SpO2 97%   BMI 32.14 kg/m  Physical Exam  ED Results / Procedures / Treatments   Labs (all labs ordered are listed, but only abnormal results are displayed) Labs Reviewed  CBC WITH DIFFERENTIAL/PLATELET - Abnormal; Notable for the following components:      Result Value   WBC 10.9 (*)    Neutro Abs 9.3 (*)    All other components within normal limits  COMPREHENSIVE METABOLIC PANEL - Abnormal; Notable for the following components:   Glucose, Bld 142 (*)    Creatinine, Ser 1.35 (*)    AST 13 (*)    Alkaline  Phosphatase 155 (*)    GFR, Estimated 39 (*)    All other components within normal limits  URINALYSIS, ROUTINE W REFLEX MICROSCOPIC - Abnormal; Notable for the following components:   Specific Gravity, Urine >1.046 (*)    Hgb urine dipstick SMALL (*)    Ketones, ur 5 (*)    Protein, ur 30 (*)    All other components within normal limits  URINE CULTURE    EKG None  Radiology CT ABDOMEN PELVIS W CONTRAST  Result Date: 07/03/2021 CLINICAL DATA:  Blunt abdominal trauma. Unwitnessed fall. Right-sided pain. EXAM: CT ABDOMEN AND PELVIS WITH CONTRAST TECHNIQUE: Multidetector CT imaging of the abdomen and pelvis was performed using the standard protocol following bolus administration of intravenous contrast.  RADIATION DOSE REDUCTION: This exam was performed according to the departmental dose-optimization program which includes automated exposure control, adjustment of the mA and/or kV according to patient size and/or use of iterative reconstruction technique. CONTRAST:  14mL OMNIPAQUE IOHEXOL 300 MG/ML  SOLN COMPARISON:  Abdominopelvic CT yesterday. FINDINGS: Lower chest: Unchanged 14 mm right lower lobe nodule, previously evaluated on 05/12/2020 PET CT. No acute airspace disease or pleural effusion. Stable heart size. TAVR. Hepatobiliary: No hepatic injury or perihepatic hematoma. Scattered hepatic cysts which are unchanged from yesterday, needing no further follow-up. Gallbladder is distended. No pericholecystic inflammation. No biliary ductal dilatation. Pancreas: No evidence of injury. No ductal dilatation or inflammation. Spleen: No splenic injury or perisplenic hematoma. Adrenals/Urinary Tract: Stable 2 cm left adrenal nodule, adenoma on prior PET. This needs no further follow-up. No adrenal hemorrhage. There is no evidence of renal injury. No hydronephrosis. Stable renal cysts needing no dedicated follow-up. No bladder injury. Slightly high-density bladder contents related to excreted IV contrast from yesterday's CT. Stomach/Bowel: No evidence of bowel injury or mesenteric hematoma. Small hiatal hernia. No bowel wall thickening. Colonic diverticulosis, prominent in the sigmoid. Persistent but improving diverticular inflammation at the junction of the descending and sigmoid colon from yesterday. No new sites of diverticulitis. No perforation. Vascular/Lymphatic: No vascular injury. Advanced aortic atherosclerosis. No retroperitoneal fluid. No adenopathy. Reproductive: Uterus and bilateral adnexa are unremarkable. Other: No free air or free fluid. Soft tissue densities in the anterior abdominal wall are typical of medication injection sites. Musculoskeletal: Chronic lumbar degeneration and fusion. Advanced L1-L2  arthropathy. No acute fracture of the lumbar spine, pelvis, included thoracic spine or ribs. There is mild subcutaneous edema about the right flank. IMPRESSION: 1. Mild subcutaneous edema about the right flank, may be contusion or dependent. 2. No additional acute traumatic injury to the abdomen or pelvis. 3. Persistent but improving diverticular inflammation at the junction of the descending and sigmoid colon from yesterday. No perforation or abscess. 4. Additional stable chronic findings as described. Aortic Atherosclerosis (ICD10-I70.0). Electronically Signed   By: Keith Rake M.D.   On: 07/03/2021 21:31   CT Cervical Spine Wo Contrast  Result Date: 07/03/2021 CLINICAL DATA:  Neck trauma (Age >= 65y) Unwitnessed fall at home. EXAM: CT CERVICAL SPINE WITHOUT CONTRAST TECHNIQUE: Multidetector CT imaging of the cervical spine was performed without intravenous contrast. Multiplanar CT image reconstructions were also generated. RADIATION DOSE REDUCTION: This exam was performed according to the departmental dose-optimization program which includes automated exposure control, adjustment of the mA and/or kV according to patient size and/or use of iterative reconstruction technique. COMPARISON:  None Available. FINDINGS: Alignment: Straightening of normal lordosis. Grade 1 anterolisthesis of C4 on C5 is degenerative. Mild broad-based levo scoliotic curvature. No traumatic subluxation. Skull base and  vertebrae: No acute fracture. Vertebral body heights are maintained. The dens and skull base are intact. Soft tissues and spinal canal: No prevertebral fluid or swelling. No visible canal hematoma. Disc levels: Diffuse degenerative disc disease, most prominently affecting C4-C5 through C6-C7. There is moderate multilevel facet hypertrophy. Upper chest: No acute or unexpected findings. Other: None. IMPRESSION: 1. No acute fracture or subluxation of the cervical spine. 2. Multilevel degenerative disc disease and facet  hypertrophy. Electronically Signed   By: Keith Rake M.D.   On: 07/03/2021 21:22   CT Head Wo Contrast  Result Date: 07/03/2021 CLINICAL DATA:  Unwitnessed fall at home. EXAM: CT HEAD WITHOUT CONTRAST TECHNIQUE: Contiguous axial images were obtained from the base of the skull through the vertex without intravenous contrast. RADIATION DOSE REDUCTION: This exam was performed according to the departmental dose-optimization program which includes automated exposure control, adjustment of the mA and/or kV according to patient size and/or use of iterative reconstruction technique. COMPARISON:  Brain MRI 05/14/2020 FINDINGS: Brain: Brain volume is normal for age. No intracranial hemorrhage, mass effect, or midline shift. No hydrocephalus. The basilar cisterns are patent. No evidence of territorial infarct or acute ischemia. No extra-axial or intracranial fluid collection. Vascular: Atherosclerosis of skullbase vasculature without hyperdense vessel or abnormal calcification. Skull: No fracture or focal lesion. Sinuses/Orbits: Chronic left sphenoid sinus disease. No acute findings Other: None. IMPRESSION: No acute intracranial abnormality. No skull fracture. Electronically Signed   By: Keith Rake M.D.   On: 07/03/2021 21:17   DG Pelvis Portable  Result Date: 07/03/2021 CLINICAL DATA:  Fall. EXAM: PORTABLE PELVIS 1-2 VIEWS COMPARISON:  CT abdomen pelvis dated 07/02/2021. FINDINGS: There is no acute fracture or dislocation. The bones are osteopenic. Partially visualized lumbosacral fusion hardware. The visualized hardware is intact. The soft tissues are unremarkable. IMPRESSION: No acute fracture or dislocation. Electronically Signed   By: Anner Crete M.D.   On: 07/03/2021 20:21   DG Chest Port 1 View  Result Date: 07/03/2021 CLINICAL DATA:  Shortness of breath. EXAM: PORTABLE CHEST 1 VIEW COMPARISON:  Chest radiograph dated 07/02/2021. FINDINGS: Shallow inspiration. There is cardiomegaly with mild  central vascular congestion. No focal consolidation, pleural effusion, pneumothorax. Atherosclerotic calcification of the aorta. Aortic valve repair. No acute osseous pathology. IMPRESSION: Cardiomegaly with mild central vascular congestion. No focal consolidation. Electronically Signed   By: Anner Crete M.D.   On: 07/03/2021 20:20   ECHOCARDIOGRAM COMPLETE  Result Date: 07/02/2021    ECHOCARDIOGRAM REPORT   Patient Name:   FREEDA SPIVEY [facility] Date of Exam: 07/02/2021 Medical Rec #:  [id number]         Height:       65.0 in Accession #:    [id number]        Weight:       193.3 lb Date of Birth:  [birth year]         BSA:          1.950 m Patient Age:    48 years          BP:           163/60 mmHg Patient Gender: F                 HR:           64 bpm. Exam Location:  [facility] Procedure: 2D Echo, Cardiac Doppler and Color Doppler Indications:    CHF  History:        Patient has prior history of Echocardiogram  examinations, most                 recent 08/07/2020. Risk Factors:Hypertension, Diabetes and                 Dyslipidemia. 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                  Aortic Valve: 26 mm Sapien prosthetic, stented (TAVR) valve is                 present in the aortic position. Procedure Date: 07/01/2020.  Sonographer:    Wenda Low Referring Phys: [id number] Force D [place]  1. Left ventricular ejection fraction, by estimation, is 50 to 55%. Left ventricular ejection fraction by 2D MOD biplane is 51.9 %. The left ventricle has low normal function. The left ventricle has no regional wall motion abnormalities. There is moderate left ventricular hypertrophy. Left ventricular diastolic parameters are consistent with Grade I diastolic dysfunction (impaired relaxation).  2. Right ventricular systolic function is normal. The right ventricular size is normal. There is mildly elevated pulmonary artery systolic pressure. The estimated right ventricular systolic pressure is 01.7 mmHg.   3. Left atrial size was moderately dilated.  4. Right atrial size was moderately dilated.  5. The mitral valve is abnormal. Trivial mitral valve regurgitation.  6. The aortic valve has been repaired/replaced. Aortic valve regurgitation is not visualized. There is a 26 mm Sapien prosthetic (TAVR) valve present in the aortic position. Procedure Date: 07/01/2020. Echo findings are consistent with normal structure and function of the aortic valve prosthesis. Aortic valve area, by VTI measures 1.83 cm. Aortic valve mean gradient measures 8.3 mmHg. Aortic valve Vmax measures 2.10 m/s. Comparison(s): Changes from prior study are noted. 08/07/2020: LVEF 45-50%, TAVR - mean gradient 9 mmHg. FINDINGS  Left Ventricle: Left ventricular ejection fraction, by estimation, is 50 to 55%. Left ventricular ejection fraction by 2D MOD biplane is 51.9 %. The left ventricle has low normal function. The left ventricle has no regional wall motion abnormalities. The left ventricular internal cavity size was normal in size. There is moderate left ventricular hypertrophy. Left ventricular diastolic parameters are consistent with Grade I diastolic dysfunction (impaired relaxation). Indeterminate filling pressures. Right Ventricle: The right ventricular size is normal. No increase in right ventricular wall thickness. Right ventricular systolic function is normal. There is mildly elevated pulmonary artery systolic pressure. The tricuspid regurgitant velocity is 2.97  m/s, and with an assumed right atrial pressure of 3 mmHg, the estimated right ventricular systolic pressure is 51.0 mmHg. Left Atrium: Left atrial size was moderately dilated. Right Atrium: Right atrial size was moderately dilated. Pericardium: There is no evidence of pericardial effusion. Presence of epicardial fat layer. Mitral Valve: The mitral valve is abnormal. Mild mitral annular calcification. Trivial mitral valve regurgitation. MV peak gradient, 12.8 mmHg. The mean mitral  valve gradient is 3.0 mmHg. Tricuspid Valve: The tricuspid valve is grossly normal. Tricuspid valve regurgitation is trivial. Aortic Valve: The aortic valve has been repaired/replaced. Aortic valve regurgitation is not visualized. Aortic valve mean gradient measures 8.3 mmHg. Aortic valve peak gradient measures 17.6 mmHg. Aortic valve area, by VTI measures 1.83 cm. There is a 26 mm Sapien prosthetic, stented (TAVR) valve present in the aortic position. Procedure Date: 07/01/2020. Echo findings are consistent with normal structure and function of the aortic valve prosthesis. Pulmonic Valve: The pulmonic valve was normal in structure. Pulmonic valve regurgitation is not visualized. Aorta: The aortic  root and ascending aorta are structurally normal, with no evidence of dilitation. IAS/Shunts: No atrial level shunt detected by color flow Doppler.  LEFT VENTRICLE PLAX 2D                        Biplane EF (MOD) LVIDd:         4.40 cm         LV Biplane EF:   Left LVIDs:         2.60 cm                          ventricular LV PW:         1.50 cm                          ejection LV IVS:        1.80 cm                          fraction by LVOT diam:     2.00 cm                          2D MOD LV SV:         84                               biplane is LV SV Index:   43                               51.9 %. LVOT Area:     3.14 cm                                Diastology                                LV e' lateral:   5.11 cm/s LV Volumes (MOD)               LV E/e' lateral: 22.9 LV vol d, MOD    79.2 ml A2C: LV vol d, MOD    46.4 ml A4C: LV vol s, MOD    38.3 ml A2C: LV vol s, MOD    21.9 ml A4C: LV SV MOD A2C:   40.9 ml LV SV MOD A4C:   46.4 ml LV SV MOD BP:    32.7 ml RIGHT VENTRICLE RV Basal diam:  3.60 cm RV Mid diam:    3.90 cm RV S prime:     16.40 cm/s TAPSE (M-mode): 2.2 cm LEFT ATRIUM             Index        RIGHT ATRIUM           Index LA diam:        4.50 cm 2.31 cm/m   RA Area:     24.70 cm LA Vol (A2C):    72.9 ml 37.39 ml/m  RA Volume:   80.20 ml  41.13 ml/m LA Vol (A4C):   85.7 ml 43.95 ml/m LA Biplane Vol: 84.5 ml 43.34 ml/m  AORTIC VALVE  PULMONIC VALVE AV Area (Vmax):    1.69 cm      PV Vmax:       0.69 m/s AV Area (Vmean):   1.60 cm      PV Peak grad:  1.9 mmHg AV Area (VTI):     1.83 cm AV Vmax:           209.67 cm/s AV Vmean:          132.000 cm/s AV VTI:            0.459 m AV Peak Grad:      17.6 mmHg AV Mean Grad:      8.3 mmHg LVOT Vmax:         113.00 cm/s LVOT Vmean:        67.300 cm/s LVOT VTI:          0.268 m LVOT/AV VTI ratio: 0.58  AORTA Ao Root diam: 3.10 cm Ao Asc diam:  3.10 cm MITRAL VALVE                TRICUSPID VALVE MV Area (PHT): 1.46 cm     TR Peak grad:   35.3 mmHg MV Area VTI:   1.36 cm     TR Vmax:        297.00 cm/s MV Peak grad:  12.8 mmHg MV Mean grad:  3.0 mmHg     SHUNTS MV Vmax:       1.79 m/s     Systemic VTI:  0.27 m MV Vmean:      70.7 cm/s    Systemic Diam: 2.00 cm MV Decel Time: 520 msec MV E velocity: 117.00 cm/s MV A velocity: 144.00 cm/s MV E/A ratio:  0.81 Lyman Bishop MD Electronically signed by Lyman Bishop MD Signature Date/Time: 07/02/2021/5:24:14 PM    Final    DG Chest 2 View  Result Date: 07/02/2021 CLINICAL DATA:  86 year old female with history of hypoxia. EXAM: CHEST - 2 VIEW COMPARISON:  Chest x-ray 03/05/2021. FINDINGS: Lung volumes are low. Widespread but patchy areas of interstitial prominence an ill-defined opacities are noted in the lungs bilaterally, most evident in the lung bases. Trace bilateral pleural effusions. No pneumothorax. Cephalization of the pulmonary vasculature. Mild cardiomegaly. Upper mediastinal contours are within normal limits. Atherosclerotic calcifications in the thoracic aorta. Status post TAVR. IMPRESSION: 1. The appearance the chest suggests mild congestive heart failure, as above. 2. Aortic atherosclerosis. 3. Status post TAVR. Electronically Signed   By: Vinnie Langton M.D.   On: 07/02/2021 08:02    CT Abdomen Pelvis W Contrast  Result Date: 07/02/2021 CLINICAL DATA:  86 year old female with 2 weeks of left lower quadrant abdominal pain. EXAM: CT ABDOMEN AND PELVIS WITH CONTRAST TECHNIQUE: Multidetector CT imaging of the abdomen and pelvis was performed using the standard protocol following bolus administration of intravenous contrast. RADIATION DOSE REDUCTION: This exam was performed according to the departmental dose-optimization program which includes automated exposure control, adjustment of the mA and/or kV according to patient size and/or use of iterative reconstruction technique. CONTRAST:  62mL OMNIPAQUE IOHEXOL 300 MG/ML  SOLN COMPARISON:  CT abdomen 11/25/2020. CTA chest abdomen and pelvis 04/25/2020. FINDINGS: Lower chest: Sequelae of TAVR. Cardiomegaly. No pericardial effusion. No pleural effusion. Partially visible 14 mm right lung nodule, appears stable since 04/25/2020. And this was associated with low metabolic activity on PET-CT last year. Lung base additional atelectasis and scarring. No pleural effusion. Hepatobiliary: Multiple circumscribed hepatic low-density lesions appear stable and most compatible with benign etiology  such as hepatic cysts (no follow-up imaging recommended). Gallbladder is mildly distended, but no cholelithiasis or pericholecystic inflammation is identified. Bile ducts appear stable from last year. Pancreas: Partial pancreatic atrophy appears stable. Spleen: Negative. Adrenals/Urinary Tract: Stable left adrenal gland adenoma (benign) as documented by PET-CT . Negative right adrenal gland. Multiple low-density renal lesions appear stable from last year (no follow-up imaging recommended). Nonobstructed kidneys with symmetric renal contrast excretion. Diminutive, negative bladder. Stomach/Bowel: Severe diverticulosis of the large bowel from the descending through the proximal rectal colon segments. Questionable mild focus of active inflammation in the left lower  quadrant near the junction of the descending and sigmoid segments on coronal image 64, different compared to the CTA last year. Superimposed retained stool. Diverticulosis also at the cecum. No other active inflammation identified. Flocculated material in the small bowel but no dilated small bowel loops. Mostly decompressed stomach. Duodenum is decompressed. No free air or free fluid. Vascular/Lymphatic: Tortuous aorta. Aortoiliac calcified atherosclerosis. Major arterial structures in the abdomen and pelvis appear patent. Portal venous system appears patent. No lymphadenopathy identified. Reproductive: Within normal limits. Other: No pelvic free fluid.  Chronic pelvic phleboliths. Musculoskeletal: Chronic lumbar decompression and fusion with underlying advanced chronic spine degeneration. Severe adjacent segment disease L1-L2. No acute osseous abnormality identified. Chronic ventral abdominal wall subcutaneous soft tissue thickening and/or stranding appears 3 stable and benign. 7 IMPRESSION: 1. Severe large bowel diverticulosis with questionable mild active inflammation in the left lower quadrant near the junction of the descending and sigmoid segments. 2. But no other acute or inflammatory process identified in the abdomen or pelvis. 3. A 14 mm right lung nodule appears stable since 04/25/2020 and was evaluated with PET-CT last year (please see that report). 4. Aortic Atherosclerosis (ICD10-I70.0). Benign left adrenal gland adenoma, hepato-renal cystic disease. Electronically Signed   By: Genevie Ann M.D.   On: 07/02/2021 07:07    Procedures Procedures  {Document cardiac monitor, telemetry assessment procedure when appropriate:1}  Medications Ordered in ED Medications  sodium chloride 0.9 % bolus 250 mL (0 mLs Intravenous Stopped 07/03/21 2128)  iohexol (OMNIPAQUE) 300 MG/ML solution 100 mL (100 mLs Intravenous Contrast Given 07/03/21 2054)    ED Course/ Medical Decision Making/ A&P                            Medical Decision Making Amount and/or Complexity of Data Reviewed Labs: ordered. Radiology: ordered.  Risk Prescription drug management.   Patient with contusion to right flank.  She will be discharged home  {Document critical care time when appropriate:1} {Document review of labs and clinical decision tools ie heart score, Chads2Vasc2 etc:1}  {Document your independent review of radiology images, and any outside records:1} {Document your discussion with family members, caretakers, and with consultants:1} {Document social determinants of health affecting pt's care:1} {Document your decision making why or why not admission, treatments were needed:1} Final Clinical Impression(s) / ED Diagnoses Final diagnoses:  Fall, initial encounter    Rx / DC Orders ED Discharge Orders     None

## 2021-07-03 NOTE — Care Management Obs Status (Signed)
[facility] NOTIFICATION   Patient Details  Name: Stephanie [name] MRN: [id number] Date of Birth: 08-03-1935   Medicare Observation Status Notification Given:  Yes    Tommy Medal 07/03/2021, 12:28 PM

## 2021-07-03 NOTE — Discharge Summary (Signed)
Physician Discharge Summary  CONOR FILSAIME PPJ:[id number] DOB: 22-Oct-1935 DOA: 07/02/2021  PCP: [facility] date: 07/02/2021  Discharge date: 07/03/2021  Admitted From:Home  Disposition:  Home  Recommendations for Outpatient Follow-up:  Follow up with PCP in 1-2 weeks Follow-up with cardiology with referral sent regarding new onset atrial fibrillation-rate controlled Started on Eliquis 5 mg twice daily for anticoagulation and aspirin and Plavix discontinued Continue antibiotics as prescribed below to finish course of treatment for diverticulitis Continue other home medications as noted below  Home Health: Yes with PT, RN  Equipment/Devices: None  Discharge Condition:Stable  CODE STATUS: Full  Diet recommendation: Heart Healthy/carb modified  Brief/Interim Summary: ITATI [name] is a 86 y.o. female with medical history significant for hypertension, dyslipidemia, type 2 diabetes with neuropathy, obesity, iron deficiency anemia, CKD stage IIIa, asthma, and prior TAVR who presented to the ED with worsening left lower quadrant abdominal pain over the last 2 weeks.  She was noted to have acute diverticulitis on CT scan as well as findings of new atrial fibrillation that was rate controlled on telemetry/EKG.  She was going to be discharged home, however she was noted to have some shortness of breath and therefore 2D echocardiogram was performed with results as noted below with LVEF 50-55% and grade 1 diastolic dysfunction.  Her TSH was 3.2.  She was diuresed with a dose of IV Lasix and feels much improved and has had Reds clip reading of 27% today.  She is in stable condition for discharge and will continue her home Lasix as previous and will be started on anticoagulation with Eliquis.  She is tolerating oral antibiotics as well and will remain on this for approximately 1 more week to complete course of treatment.  No other acute events noted and she is  stable for discharge.  Discharge Diagnoses:  Principal Problem:   Diverticulitis Active Problems:   Essential hypertension, benign   Mixed hyperlipidemia   Class 2 severe obesity due to excess calories with serious comorbidity and body mass index (BMI) of 36.0 to 36.9 in adult [facility])   Symptomatic anemia   Asthma   DM (diabetes mellitus) ([place])   S/P TAVR (transcatheter aortic valve replacement)  Principal discharge diagnosis: Acute diverticulitis.  New onset atrial fibrillation with associated mild acute on chronic diastolic CHF exacerbation.  Discharge Instructions  Discharge Instructions     Ambulatory referral to Cardiology   Complete by: As directed    Diet - low sodium heart healthy   Complete by: As directed    If the dressing is still on your incision site when you go home, remove it on the third day after your surgery date. Remove dressing if it begins to fall off, or if it is dirty or damaged before the third day.   Complete by: As directed    Increase activity slowly   Complete by: As directed       Allergies as of 07/03/2021       Reactions   Amoxicillin Swelling   Tongue swelling   Nitrofurantoin Nausea And Vomiting   Other reaction(s): vomiting Other reaction(s): vomiting   Penicillins Swelling   Has patient had a PCN reaction causing immediate rash, facial/tongue/throat swelling, SOB or lightheadedness with hypotension: No Has patient had a PCN reaction causing severe rash involving mucus membranes or skin necrosis: No Has patient had a PCN reaction that required hospitalization: No Has patient had a PCN reaction occurring within the last 10  years: No If all of the above answers are "NO", then may proceed with Cephalosporin use.   Sulfa Antibiotics Swelling   Lip swelling        Medication List     STOP taking these medications    aspirin 81 MG chewable tablet   ciprofloxacin 250 MG tablet Commonly known as: CIPRO   clopidogrel 75 MG  tablet Commonly known as: PLAVIX       TAKE these medications    albuterol (2.5 MG/3ML) 0.083% nebulizer solution Commonly known as: PROVENTIL Take 2.5 mg by nebulization every 6 (six) hours as needed for wheezing or shortness of breath.   allopurinol 300 MG tablet Commonly known as: ZYLOPRIM Take 300 mg by mouth daily.   apixaban 5 MG Tabs tablet Commonly known as: ELIQUIS Take 1 tablet (5 mg total) by mouth 2 (two) times daily.   carvedilol 6.25 MG tablet Commonly known as: COREG Take 1 tablet (6.25 mg total) by mouth 2 (two) times daily.   diclofenac Sodium 1 % Gel Commonly known as: VOLTAREN Apply 2 g topically 4 (four) times daily as needed (pain).   ferrous sulfate 325 (65 FE) MG EC tablet Take 325 mg by mouth daily with breakfast.   furosemide 40 MG tablet Commonly known as: LASIX TAKE 1 TABLET BY MOUTH ONCE DAILY.   gabapentin 100 MG capsule Commonly known as: NEURONTIN Take 100-200 mg by mouth at bedtime.   Levemir FlexTouch 100 UNIT/ML FlexPen Generic drug: insulin detemir Inject 30 Units into the skin at bedtime.   meloxicam 15 MG tablet Commonly known as: MOBIC Take 15 mg by mouth daily.   metFORMIN 500 MG tablet Commonly known as: GLUCOPHAGE Take 1 tablet (500 mg total) by mouth 2 (two) times daily.   metroNIDAZOLE 500 MG tablet Commonly known as: FLAGYL Take 1 tablet (500 mg total) by mouth every 8 (eight) hours for 7 days.   moxifloxacin 400 MG tablet Commonly known as: Avelox Take 1 tablet (400 mg total) by mouth daily at 8 pm for 5 days.   traMADol 50 MG tablet Commonly known as: ULTRAM Take 50 mg by mouth daily as needed for moderate pain.               Discharge Care Instructions  (From admission, onward)           Start     Ordered   07/03/21 0000  If the dressing is still on your incision site when you go home, remove it on the third day after your surgery date. Remove dressing if it begins to fall off, or if it is  dirty or damaged before the third day.        07/03/21 1126            Follow-up Information     [facility] information: [address] [place] [address] [phone]         [facility] Schedule an appointment as soon as possible for a visit in 1 week(s).   Contact information: [address] [place] [address] [phone]         Satira Sark, MD. Schedule an appointment as soon as possible for a visit in 2 week(s).   Specialty: Cardiology Contact information: [place] [address] [phone]                Allergies  Allergen Reactions  Amoxicillin Swelling    Tongue swelling    Nitrofurantoin Nausea And Vomiting    Other reaction(s): vomiting Other reaction(s): vomiting   Penicillins Swelling    Has patient had a PCN reaction causing immediate rash, facial/tongue/throat swelling, SOB or lightheadedness with hypotension: No Has patient had a PCN reaction causing severe rash involving mucus membranes or skin necrosis: No Has patient had a PCN reaction that required hospitalization: No Has patient had a PCN reaction occurring within the last 10 years: No If all of the above answers are "NO", then may proceed with Cephalosporin use.    Sulfa Antibiotics Swelling    Lip swelling    Consultations: None   Procedures/Studies: ECHOCARDIOGRAM COMPLETE  Result Date: 07/02/2021    ECHOCARDIOGRAM REPORT   Patient Name:   Stephanie [name] [facility] Date of Exam: 07/02/2021 Medical Rec #:  [id number]         Height:       65.0 in Accession #:    [id number]        Weight:       193.3 lb Date of Birth:  Apr 30, 1935         BSA:          1.950 m Patient Age:    43 years          BP:           163/60 mmHg Patient Gender: F                 HR:           64 bpm. Exam Location:  [facility] Procedure: 2D Echo, Cardiac Doppler and Color Doppler Indications:    CHF  History:         Patient has prior history of Echocardiogram examinations, most                 recent 08/07/2020. Risk Factors:Hypertension, Diabetes and                 Dyslipidemia. S/P TAVR with a 71mm Edwards S3U on 07/01/2020.                  Aortic Valve: 26 mm Sapien prosthetic, stented (TAVR) valve is                 present in the aortic position. Procedure Date: 07/01/2020.  Sonographer:    Wenda Low Referring Phys: [id number] [place]  1. Left ventricular ejection fraction, by estimation, is 50 to 55%. Left ventricular ejection fraction by 2D MOD biplane is 51.9 %. The left ventricle has low normal function. The left ventricle has no regional wall motion abnormalities. There is moderate left ventricular hypertrophy. Left ventricular diastolic parameters are consistent with Grade I diastolic dysfunction (impaired relaxation).  2. Right ventricular systolic function is normal. The right ventricular size is normal. There is mildly elevated pulmonary artery systolic pressure. The estimated right ventricular systolic pressure is 03.5 mmHg.  3. Left atrial size was moderately dilated.  4. Right atrial size was moderately dilated.  5. The mitral valve is abnormal. Trivial mitral valve regurgitation.  6. The aortic valve has been repaired/replaced. Aortic valve regurgitation is not visualized. There is a 26 mm Sapien prosthetic (TAVR) valve present in the aortic position. Procedure Date: 07/01/2020. Echo findings are consistent with normal structure and function of the aortic valve prosthesis. Aortic valve area, by VTI measures 1.83 cm. Aortic valve mean gradient measures 8.3 mmHg. Aortic valve  Vmax measures 2.10 m/s. Comparison(s): Changes from prior study are noted. 08/07/2020: LVEF 45-50%, TAVR - mean gradient 9 mmHg. FINDINGS  Left Ventricle: Left ventricular ejection fraction, by estimation, is 50 to 55%. Left ventricular ejection fraction by 2D MOD biplane is 51.9 %. The left ventricle has low normal  function. The left ventricle has no regional wall motion abnormalities. The left ventricular internal cavity size was normal in size. There is moderate left ventricular hypertrophy. Left ventricular diastolic parameters are consistent with Grade I diastolic dysfunction (impaired relaxation). Indeterminate filling pressures. Right Ventricle: The right ventricular size is normal. No increase in right ventricular wall thickness. Right ventricular systolic function is normal. There is mildly elevated pulmonary artery systolic pressure. The tricuspid regurgitant velocity is 2.97  m/s, and with an assumed right atrial pressure of 3 mmHg, the estimated right ventricular systolic pressure is 93.2 mmHg. Left Atrium: Left atrial size was moderately dilated. Right Atrium: Right atrial size was moderately dilated. Pericardium: There is no evidence of pericardial effusion. Presence of epicardial fat layer. Mitral Valve: The mitral valve is abnormal. Mild mitral annular calcification. Trivial mitral valve regurgitation. MV peak gradient, 12.8 mmHg. The mean mitral valve gradient is 3.0 mmHg. Tricuspid Valve: The tricuspid valve is grossly normal. Tricuspid valve regurgitation is trivial. Aortic Valve: The aortic valve has been repaired/replaced. Aortic valve regurgitation is not visualized. Aortic valve mean gradient measures 8.3 mmHg. Aortic valve peak gradient measures 17.6 mmHg. Aortic valve area, by VTI measures 1.83 cm. There is a 26 mm Sapien prosthetic, stented (TAVR) valve present in the aortic position. Procedure Date: 07/01/2020. Echo findings are consistent with normal structure and function of the aortic valve prosthesis. Pulmonic Valve: The pulmonic valve was normal in structure. Pulmonic valve regurgitation is not visualized. Aorta: The aortic root and ascending aorta are structurally normal, with no evidence of dilitation. IAS/Shunts: No atrial level shunt detected by color flow Doppler.  LEFT VENTRICLE PLAX 2D                         Biplane EF (MOD) LVIDd:         4.40 cm         LV Biplane EF:   Left LVIDs:         2.60 cm                          ventricular LV PW:         1.50 cm                          ejection LV IVS:        1.80 cm                          fraction by LVOT diam:     2.00 cm                          2D MOD LV SV:         84                               biplane is LV SV Index:   43  51.9 %. LVOT Area:     3.14 cm                                Diastology                                LV e' lateral:   5.11 cm/s LV Volumes (MOD)               LV E/e' lateral: 22.9 LV vol d, MOD    79.2 ml A2C: LV vol d, MOD    46.4 ml A4C: LV vol s, MOD    38.3 ml A2C: LV vol s, MOD    21.9 ml A4C: LV SV MOD A2C:   40.9 ml LV SV MOD A4C:   46.4 ml LV SV MOD BP:    32.7 ml RIGHT VENTRICLE RV Basal diam:  3.60 cm RV Mid diam:    3.90 cm RV S prime:     16.40 cm/s TAPSE (M-mode): 2.2 cm LEFT ATRIUM             Index        RIGHT ATRIUM           Index LA diam:        4.50 cm 2.31 cm/m   RA Area:     24.70 cm LA Vol (A2C):   72.9 ml 37.39 ml/m  RA Volume:   80.20 ml  41.13 ml/m LA Vol (A4C):   85.7 ml 43.95 ml/m LA Biplane Vol: 84.5 ml 43.34 ml/m  AORTIC VALVE                     PULMONIC VALVE AV Area (Vmax):    1.69 cm      PV Vmax:       0.69 m/s AV Area (Vmean):   1.60 cm      PV Peak grad:  1.9 mmHg AV Area (VTI):     1.83 cm AV Vmax:           209.67 cm/s AV Vmean:          132.000 cm/s AV VTI:            0.459 m AV Peak Grad:      17.6 mmHg AV Mean Grad:      8.3 mmHg LVOT Vmax:         113.00 cm/s LVOT Vmean:        67.300 cm/s LVOT VTI:          0.268 m LVOT/AV VTI ratio: 0.58  AORTA Ao Root diam: 3.10 cm Ao Asc diam:  3.10 cm MITRAL VALVE                TRICUSPID VALVE MV Area (PHT): 1.46 cm     TR Peak grad:   35.3 mmHg MV Area VTI:   1.36 cm     TR Vmax:        297.00 cm/s MV Peak grad:  12.8 mmHg MV Mean grad:  3.0 mmHg     SHUNTS MV Vmax:       1.79 m/s     Systemic VTI:   0.27 m MV Vmean:      70.7 cm/s    Systemic Diam: 2.00 cm MV Decel Time: 520 msec MV E velocity: 117.00 cm/s MV A  velocity: 144.00 cm/s MV E/A ratio:  0.81 Lyman Bishop MD Electronically signed by Lyman Bishop MD Signature Date/Time: 07/02/2021/5:24:14 PM    Final    DG Chest 2 View  Result Date: 07/02/2021 CLINICAL DATA:  86 year old female with history of hypoxia. EXAM: CHEST - 2 VIEW COMPARISON:  Chest x-ray 03/05/2021. FINDINGS: Lung volumes are low. Widespread but patchy areas of interstitial prominence an ill-defined opacities are noted in the lungs bilaterally, most evident in the lung bases. Trace bilateral pleural effusions. No pneumothorax. Cephalization of the pulmonary vasculature. Mild cardiomegaly. Upper mediastinal contours are within normal limits. Atherosclerotic calcifications in the thoracic aorta. Status post TAVR. IMPRESSION: 1. The appearance the chest suggests mild congestive heart failure, as above. 2. Aortic atherosclerosis. 3. Status post TAVR. Electronically Signed   By: Vinnie Langton M.D.   On: 07/02/2021 08:02   CT Abdomen Pelvis W Contrast  Result Date: 07/02/2021 CLINICAL DATA:  86 year old female with 2 weeks of left lower quadrant abdominal pain. EXAM: CT ABDOMEN AND PELVIS WITH CONTRAST TECHNIQUE: Multidetector CT imaging of the abdomen and pelvis was performed using the standard protocol following bolus administration of intravenous contrast. RADIATION DOSE REDUCTION: This exam was performed according to the departmental dose-optimization program which includes automated exposure control, adjustment of the mA and/or kV according to patient size and/or use of iterative reconstruction technique. CONTRAST:  52mL OMNIPAQUE IOHEXOL 300 MG/ML  SOLN COMPARISON:  CT abdomen 11/25/2020. CTA chest abdomen and pelvis 04/25/2020. FINDINGS: Lower chest: Sequelae of TAVR. Cardiomegaly. No pericardial effusion. No pleural effusion. Partially visible 14 mm right lung nodule, appears  stable since 04/25/2020. And this was associated with low metabolic activity on PET-CT last year. Lung base additional atelectasis and scarring. No pleural effusion. Hepatobiliary: Multiple circumscribed hepatic low-density lesions appear stable and most compatible with benign etiology such as hepatic cysts (no follow-up imaging recommended). Gallbladder is mildly distended, but no cholelithiasis or pericholecystic inflammation is identified. Bile ducts appear stable from last year. Pancreas: Partial pancreatic atrophy appears stable. Spleen: Negative. Adrenals/Urinary Tract: Stable left adrenal gland adenoma (benign) as documented by PET-CT . Negative right adrenal gland. Multiple low-density renal lesions appear stable from last year (no follow-up imaging recommended). Nonobstructed kidneys with symmetric renal contrast excretion. Diminutive, negative bladder. Stomach/Bowel: Severe diverticulosis of the large bowel from the descending through the proximal rectal colon segments. Questionable mild focus of active inflammation in the left lower quadrant near the junction of the descending and sigmoid segments on coronal image 64, different compared to the CTA last year. Superimposed retained stool. Diverticulosis also at the cecum. No other active inflammation identified. Flocculated material in the small bowel but no dilated small bowel loops. Mostly decompressed stomach. Duodenum is decompressed. No free air or free fluid. Vascular/Lymphatic: Tortuous aorta. Aortoiliac calcified atherosclerosis. Major arterial structures in the abdomen and pelvis appear patent. Portal venous system appears patent. No lymphadenopathy identified. Reproductive: Within normal limits. Other: No pelvic free fluid.  Chronic pelvic phleboliths. Musculoskeletal: Chronic lumbar decompression and fusion with underlying advanced chronic spine degeneration. Severe adjacent segment disease L1-L2. No acute osseous abnormality identified. Chronic  ventral abdominal wall subcutaneous soft tissue thickening and/or stranding appears 3 stable and benign. 7 IMPRESSION: 1. Severe large bowel diverticulosis with questionable mild active inflammation in the left lower quadrant near the junction of the descending and sigmoid segments. 2. But no other acute or inflammatory process identified in the abdomen or pelvis. 3. A 14 mm right lung nodule appears stable since 04/25/2020 and was evaluated  with PET-CT last year (please see that report). 4. Aortic Atherosclerosis (ICD10-I70.0). Benign left adrenal gland adenoma, hepato-renal cystic disease. Electronically Signed   By: Genevie Ann M.D.   On: 07/02/2021 07:07     Discharge Exam: Vitals:   07/02/21 2256 07/03/21 0547  BP: (!) 152/68 (!) 154/68  Pulse: 72 74  Resp: 20 18  Temp: 99 F (37.2 C) 98.3 F (36.8 C)  SpO2: 100% 98%   Vitals:   07/02/21 2155 07/02/21 2256 07/03/21 0500 07/03/21 0547  BP: (!) 185/64 (!) 152/68  (!) 154/68  Pulse: 66 72  74  Resp: 20 20  18   Temp: 98.9 F (37.2 C) 99 F (37.2 C)  98.3 F (36.8 C)  TempSrc: Oral Oral  Oral  SpO2: 100% 100%  98%  Weight:   87.6 kg   Height:        General: Pt is alert, awake, not in acute distress Cardiovascular: RRR, S1/S2 +, no rubs, no gallops Respiratory: CTA bilaterally, no wheezing, no rhonchi Abdominal: Soft, NT, ND, bowel sounds + Extremities: no edema, no cyanosis    The results of significant diagnostics from this hospitalization (including imaging, microbiology, ancillary and laboratory) are listed below for reference.     Microbiology: No results found for this or any previous visit (from the past 240 hour(s)).   Labs: BNP (last 3 results) Recent Labs    09/03/20 1721 11/17/20 0942 07/02/21 0350  BNP 405.0* 118.0* 222.9*   Basic Metabolic Panel: Recent Labs  Lab 07/02/21 0350 07/03/21 0439  NA 139 137  K 4.5 4.6  CL 105 102  CO2 28 28  GLUCOSE 109* 123*  BUN 17 15  CREATININE 1.12* 1.18*   CALCIUM 10.4* 9.9  MG  --  1.8   Liver Function Tests: Recent Labs  Lab 07/02/21 0350 07/03/21 0439  AST 13* 10*  ALT 8 6  ALKPHOS 164* 149*  BILITOT 0.4 0.8  PROT 7.5 6.7  ALBUMIN 3.8 3.6   Recent Labs  Lab 07/02/21 0350  LIPASE 40   No results for input(s): "AMMONIA" in the last 168 hours. CBC: Recent Labs  Lab 07/02/21 0350 07/03/21 0439  WBC 11.2* 9.4  HGB 12.8 11.9*  HCT 40.6 39.5  MCV 86.9 89.8  PLT 230 191   Cardiac Enzymes: No results for input(s): "CKTOTAL", "CKMB", "CKMBINDEX", "TROPONINI" in the last 168 hours. BNP: Invalid input(s): "POCBNP" CBG: Recent Labs  Lab 07/02/21 1215 07/02/21 2050 07/03/21 0800  GLUCAP 103* 127* 152*   D-Dimer No results for input(s): "DDIMER" in the last 72 hours. Hgb A1c Recent Labs    07/02/21 0350  HGBA1C 7.5*   Lipid Profile No results for input(s): "CHOL", "HDL", "LDLCALC", "TRIG", "CHOLHDL", "LDLDIRECT" in the last 72 hours. Thyroid function studies Recent Labs    07/02/21 0350  TSH 3.200   Anemia work up No results for input(s): "VITAMINB12", "FOLATE", "FERRITIN", "TIBC", "IRON", "RETICCTPCT" in the last 72 hours. Urinalysis    Component Value Date/Time   COLORURINE YELLOW 07/02/2021 0350   APPEARANCEUR CLEAR 07/02/2021 0350   APPEARANCEUR Hazy (A) 05/23/2020 1119   LABSPEC 1.011 07/02/2021 0350   PHURINE 6.0 07/02/2021 0350   GLUCOSEU NEGATIVE 07/02/2021 0350   HGBUR SMALL (A) 07/02/2021 0350   BILIRUBINUR NEGATIVE 07/02/2021 0350   BILIRUBINUR Negative 05/23/2020 1119   KETONESUR NEGATIVE 07/02/2021 0350   PROTEINUR 100 (A) 07/02/2021 0350   UROBILINOGEN 0.2 04/25/2014 0936   NITRITE NEGATIVE 07/02/2021 0350   LEUKOCYTESUR TRACE (A)  07/02/2021 0350   Sepsis Labs Recent Labs  Lab 07/02/21 0350 07/03/21 0439  WBC 11.2* 9.4   Microbiology No results found for this or any previous visit (from the past 240 hour(s)).   Time coordinating discharge: 35 minutes  SIGNED:   Rodena Goldmann, DO [facility] 07/03/2021, 11:32 AM  If 7PM-7AM, please contact night-coverage [email or web address]

## 2021-07-03 NOTE — Discharge Instructions (Signed)
Take Tylenol for pain and make sure you take your medicines for your diverticulitis and follow-up with your doctor as instructed due to

## 2021-07-03 NOTE — ED Triage Notes (Addendum)
Pt arrived from home by [facility] after an unwitnessed fall from home when attempted to the bathroom. Pt complains of pain on right side. Family says pt was in floor from 3-4 hours. Pt discharged earlier today with complaints of abdominal pain.

## 2021-07-03 NOTE — Progress Notes (Signed)
   07/03/21 1100  ReDS Vest / Clip  Station Marker B  Ruler Value 35  ReDS Value Range < 36  ReDS Actual Value 27

## 2021-07-03 NOTE — Consult Note (Addendum)
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 Wound type:Neuropathic Pressure Injury POA: N/A Measurement: Per Nursing Flow Sheet, 1cm round x 0.1cm ruptured blister Wound bed: red, moist Drainage (amount, consistency, odor) small serous Periwound: intact Dressing procedure/placement/frequency: I have provided Nursing with guidance for the care of this lesion, using a soap and water cleanse of the foot, rinse and thorough drying (particularly between digits). Topical care of the lesion will be to pain twice daily with a povidone iodine swab stick and allow to air dry prior to covering with a dry gauze dressing and securing with conform bandaging.  Heels are to be floated and a sacral foam applied for PI prevention.  [facility] nursing team will not follow, but will remain available to this patient, the nursing and medical teams.  Please re-consult if needed.  Thank you for inviting [place] to participate in this patient's Plan of Care.  Maudie Flakes, MSN, RN, CNS, [place], Serita Grammes, [employer], [employer] phone:  [phone]

## 2021-07-05 LAB — URINE CULTURE: Culture: NO GROWTH

## 2021-07-06 NOTE — Progress Notes (Deleted)
[place]                                     Cardiology Office Note:    Date:  07/06/2021   ID:  [name], DOB [birth year], MRN [id number]  PCP:  [facility] Cardiologist:  Rozann Lesches, MD  / Dr. Burt Knack & Dr. Cyndia Bent (TAVR) [facility] Electrophysiologist:  None   Referring MD: Antionette Char, MD   CC: 1 year s/p TAVR  History of Present Illness:    AZLYN WINGLER is a 86 y.o. female with a hx of anemia, arthritis, asthma, CKD stage IIIA, COPD, HTN, HLD, DMT2 and severe aortic stenosis s/p TAVR (07/01/20) who presents to clinic for follow up.   She was admitted to [facility] in 03/2020 for flash pulmonary edema that improved with IV lasix. 2D echocardiogram showed a trileaflet aortic valve with moderate calcification and thickening of the leaflets.  The mean gradient was measured at 25 mmHg with a peak gradient of 44 mmHg.  Aortic valve area by VTI was 0.81 cm with a dimensionless index of 0.26 and a stroke-volume index of 32.  Left ventricular ejection fraction was 55 to 60% with grade 2 diastolic dysfunction.  She was transferred to [facility] and underwent cardiac catheterization showing nonobstructive coronary disease with 50% left circumflex stenosis.  The aortic valve mean gradient was measured at 30 mmHg with a peak gradient of 26 mmHg.  Aortic valve area was measured at 1.56 cm.  Cardiac index was 3.3. LVEDP was 18.  Right heart pressures were within normal limits.  She was discharged home on Lasix 20 mg daily.     She was evaluated by the multidisciplinary valve team and underwent successful TAVR with a 26 mm Edwards Sapien 3 Ultra THV via the TF approach on 07/01/2020. Post operative echo showed EF 60%, normally functioning TAVR with a mean gradient of 12 mmHg and no PVL. She was discharged on aspirin and Plavix. [place] showed a new 1st deg AV block and LABF and so she was  discharged with a Zio AT. 1 month echo showed EF 45-50% (mildly decreased from previous), severe LVH, normally functioning TAVR with a mean gradient of 9 mm hg and no PVL.  She was recently admitted 6/15-6/16/23 for diverticulitis. Noted to have new onset afib and started on Eliquis (aspirin and plavix discontinued).  Also had acute on chronic diastolic CHF. Echo 07/02/21 showed EF 50-55%, moderate LVH, normally functioning TAVR with a mean gradient of 8.3 mm hg and no PVL.  Today she presents to clinic for follow up.    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 Burt Knack and Dr. Cyndia Bent.   Severe aortic stenosis    Symptomatic anemia    Presumed GI bleed January 2021 - she declined GI work-up   Type 2 diabetes mellitus ([place])     Past Surgical History:  Procedure Laterality Date   APPENDECTOMY     BACK SURGERY     CYST REMOVAL HAND     Right,  hospital in [place] ECHOCARDIOGRAM Left 07/01/2020   Procedure: INTRAOPERATIVE TRANSTHORACIC ECHOCARDIOGRAM;  Surgeon: Sherren Mocha, MD;  Location: [place];  Service: Open Heart Surgery;  Laterality: Left;   KNEE ARTHROSCOPY WITH MEDIAL MENISECTOMY Right 02/28/2012   Procedure: KNEE ARTHROSCOPY WITH MEDIAL MENISECTOMY;  Surgeon: Carole Civil, MD;  Location: [facility];  Service: Orthopedics;  Laterality: Right;   REPAIR KNEE LIGAMENT     RIGHT/LEFT HEART CATH AND CORONARY ANGIOGRAPHY N/A 04/10/2020   Procedure: RIGHT/LEFT HEART CATH AND CORONARY ANGIOGRAPHY;  Surgeon: Jettie Booze, MD;  Location: [place] [facility];  Service: Cardiovascular;  Laterality: N/A;   TRANSCATHETER AORTIC VALVE REPLACEMENT, TRANSFEMORAL N/A 07/01/2020   Procedure: TRANSCATHETER AORTIC VALVE REPLACEMENT,  TRANSFEMORAL;  Surgeon: Sherren Mocha, MD;  Location: [place];  Service: Open Heart Surgery;  Laterality: N/A;   ULTRASOUND GUIDANCE FOR VASCULAR ACCESS Bilateral 07/01/2020   Procedure: ULTRASOUND GUIDANCE FOR VASCULAR ACCESS;  Surgeon: Sherren Mocha, MD;  Location: [place];  Service: Open Heart Surgery;  Laterality: Bilateral;    Current Medications: No outpatient medications have been marked as taking for the 07/08/21 encounter (Appointment) with [facility].     Allergies:   Amoxicillin, Nitrofurantoin, Penicillins, and Sulfa antibiotics   Social History   Socioeconomic History   Marital status: Divorced    Spouse name: Not on file   Number of children: 3   Years of education: Not on file   Highest education level: Not on file  Occupational History   Occupation: Retired-cook   Tobacco Use   Smoking status: Former    Years: 7.00    Types: Cigarettes   Smokeless tobacco: Never  Vaping Use   Vaping Use: Never used  Substance and Sexual Activity   Alcohol use: No   Drug use: No   Sexual activity: Never  Other Topics Concern   Not on file  Social History Narrative   ** Merged History Encounter **       Social Determinants of Health   Financial Resource Strain: Not on file  Food Insecurity: Not on file  Transportation Needs: Not on file  Physical Activity: Not on file  Stress: Not on file  Social Connections: Not on file     Family History: The patient's family history includes Arthritis in an other family member; Asthma in an other family member; Cancer in an other family member; Diabetes in her brother, father, mother, and another family member; Diverticulitis in her mother; Hypertension in her mother and sister; Lung disease in an other family member. There is no history of Colon cancer.  ROS:   Please see the history of present illness.    All other systems reviewed and are negative.  EKGs/Labs/Other Studies Reviewed:    The following  studies were reviewed today: TAVR OPERATIVE NOTE     Date of Procedure:                07/01/2020   Preoperative Diagnosis:      Severe Aortic Stenosis   Postoperative Diagnosis:    Same   Procedure:        Transcatheter Aortic Valve Replacement - Percutaneous Transfemoral Approach             Edwards Sapien 3 Ultra THV (size 26 mm, model # 9750TFX, serial # [id number])              Co-Surgeons:  Gaye Pollack, MD and Sherren Mocha, MD   Anesthesiologist:                  Duane Boston, MD   Echocardiographer:              Jenkins Rouge, MD   Pre-operative Echo Findings: Severe aortic stenosis Normal left ventricular systolic function   Post-operative Echo Findings: No paravalvular leak Normal/unchanged left ventricular systolic function    _____________    Echo 07/02/2020: IMPRESSIONS   1. Left ventricular ejection fraction, by estimation, is 60 to 65%. The  left ventricle has normal function. The left ventricle has no regional  wall motion abnormalities. There is severe left ventricular hypertrophy.  Left ventricular diastolic parameters   are consistent with Grade I diastolic dysfunction (impaired relaxation).   2. Right ventricular systolic function is normal. The right ventricular  size is normal.   3. Left atrial size was mildly dilated.   4. The mitral valve is degenerative. Trivial mitral valve regurgitation.  No evidence of mitral stenosis. Moderate mitral annular calcification.   5. Post TAVR 26 mm Sapien 3 valve 07/01/20 no PVL mean gradient 12 peak 23  mmHg with AVA 2.4 cm2 and DVI 0.52. The aortic valve has been  repaired/replaced. Aortic valve regurgitation is not visualized. No aortic  stenosis is present. There is a 26 mm  Sapien prosthetic (TAVR) valve present in the aortic position. Procedure  Date: 07/01/20.   6. The inferior vena cava is normal in size with greater than 50%  respiratory variability, suggesting right atrial  pressure of 3 mmHg.   _______________________   Echo 08/07/20 IMPRESSIONS  1. Mild global reduction in LV systolic function; s/p TAVR (mean gradient 9 mmHg, peak velocity 2.1 m/s; DI 0.38; no AI); compared to 07/01/20, LV function slightly reduced.  2. Left ventricular ejection fraction, by estimation, is 45 to 50%. The left ventricle has mildly decreased function. The left ventricle demonstrates global hypokinesis. There is severe left ventricular hypertrophy. Left ventricular diastolic parameters are consistent with Grade I diastolic dysfunction (impaired relaxation).  3. Right ventricular systolic function is normal. The right ventricular size is normal.  4. Left atrial size was mildly dilated.  5. The mitral valve is normal in structure. No evidence of mitral valve regurgitation. No evidence of mitral stenosis.  6. The aortic valve has been repaired/replaced. Aortic valve regurgitation is not visualized. No aortic stenosis is present. There is a 26 mm Sapien prosthetic (TAVR) valve present in the aortic position. Procedure Date: 07/01/2020.  7. The inferior vena cava is normal in size with greater than 50% respiratory variability, suggesting right atrial pressure of 3 mmHg.  ________________________  Echo 07/02/21 IMPRESSIONS   1. Left ventricular ejection fraction, by estimation, is 50 to 55%. Left  ventricular ejection fraction by 2D MOD biplane is 51.9 %. The left  ventricle has low normal function. The left ventricle has no regional wall  motion abnormalities. There is  moderate left ventricular hypertrophy. Left ventricular diastolic  parameters are consistent with Grade I diastolic dysfunction (impaired  relaxation).   2. Right ventricular systolic function is normal. The right ventricular  size is normal. There is mildly elevated pulmonary artery systolic  pressure. The estimated right ventricular systolic pressure is 19.5 mmHg.   3. Left atrial size was moderately dilated.   4.  Right atrial size was moderately dilated.   5. The mitral valve is abnormal. Trivial mitral valve regurgitation.   6. The aortic  valve has been repaired/replaced. Aortic valve  regurgitation is not visualized. There is a 26 mm Sapien prosthetic (TAVR)  valve present in the aortic position. Procedure Date: 07/01/2020. Echo  findings are consistent with normal structure  and function of the aortic valve prosthesis. Aortic valve area, by VTI  measures 1.83 cm. Aortic valve mean gradient measures 8.3 mmHg. Aortic  valve Vmax measures 2.10 m/s.   Comparison(s): Changes from prior study are noted. 08/07/2020: LVEF 45-50%,  TAVR - mean gradient 9 mmHg.     EKG:  EKG is NOT ordered today.    Recent Labs: 07/02/2021: B Natriuretic Peptide 285.0; TSH 3.200 07/03/2021: ALT 9; BUN 23; Creatinine, Ser 1.35; Hemoglobin 12.3; Magnesium 1.8; Platelets 203; Potassium 5.0; Sodium 139  Recent Lipid Panel    Component Value Date/Time   CHOL 184 04/11/2020 0945   TRIG 283 (H) 04/11/2020 0945   HDL 46 04/11/2020 0945   CHOLHDL 4.0 04/11/2020 0945   VLDL 57 (H) 04/11/2020 0945   LDLCALC 81 04/11/2020 0945     Risk Assessment/Calculations:       Physical Exam:    VS:  There were no vitals taken for this visit.    Wt Readings from Last 3 Encounters:  07/03/21 193 lb 2 oz (87.6 kg)  07/03/21 193 lb 2 oz (87.6 kg)  05/06/21 196 lb 6.4 oz (89.1 kg)     GEN:  Well nourished, well developed in no acute distress, obese HEENT: Normal NECK: No JVD; LYMPHATICS: No lymphadenopathy CARDIAC: RRR, soft flow murmur. No rubs, gallops RESPIRATORY:  Clear to auscultation without rales, wheezing or rhonchi  ABDOMEN: Soft, non-tender, non-distended MUSCULOSKELETAL: no LE edema.  SKIN: Warm and dry.  NEUROLOGIC:  Alert and oriented x 3 PSYCHIATRIC:  Normal affect   ASSESSMENT:    1. S/P TAVR (transcatheter aortic valve replacement)   2. PAF (paroxysmal atrial fibrillation) ([place])   3. Essential  hypertension   4. Renal mass   5. Pulmonary nodule, right   6. Chronic diastolic heart failure (HCC)      PLAN:    In order of problems listed above:  Severe AS s/p TAVR: echo during recent admission 07/02/21 showed EF 50-55%, moderate LVH, normally functioning TAVR with a mean gradient of 8.3 mm hg and no PVL. She has NYHA class II symptoms. Continue aspirin alone. SBE prophylaxis discussed; I have RX'd azithromycin due to a PCN allergy  PAF:    HTN:   Renal mass: found on pre TAVR CT and followed by Dr. Alyson Ingles with urology   Lung nodule: followed by pulmonary  Chronic diastolic CHF:    Medication Adjustments/Labs and Tests Ordered: Current medicines are reviewed at length with the patient today.  Concerns regarding medicines are outlined above.  No orders of the defined types were placed in this encounter.    No orders of the defined types were placed in this encounter.     There are no Patient Instructions on file for this visit.   Signed, Angelena Form, PA-C  07/06/2021 12:44 PM    [facility]

## 2021-07-07 LAB — GLUCOSE, CAPILLARY: Glucose-Capillary: 127 mg/dL — ABNORMAL HIGH (ref 70–99)

## 2021-07-08 ENCOUNTER — Ambulatory Visit: Payer: Medicare Other

## 2021-07-08 ENCOUNTER — Encounter (HOSPITAL_COMMUNITY): Payer: Self-pay

## 2021-07-08 ENCOUNTER — Ambulatory Visit (HOSPITAL_COMMUNITY): Payer: Medicare PPO

## 2021-07-08 DIAGNOSIS — R911 Solitary pulmonary nodule: Secondary | ICD-10-CM

## 2021-07-08 DIAGNOSIS — I10 Essential (primary) hypertension: Secondary | ICD-10-CM

## 2021-07-08 DIAGNOSIS — Z952 Presence of prosthetic heart valve: Secondary | ICD-10-CM

## 2021-07-08 DIAGNOSIS — N2889 Other specified disorders of kidney and ureter: Secondary | ICD-10-CM

## 2021-07-08 DIAGNOSIS — I480 Paroxysmal atrial fibrillation: Secondary | ICD-10-CM

## 2021-07-08 DIAGNOSIS — I5032 Chronic diastolic (congestive) heart failure: Secondary | ICD-10-CM

## 2021-07-08 NOTE — Progress Notes (Deleted)
[place]                                     Cardiology Office Note:    Date:  07/08/2021   ID:  [name], DOB 1935-08-19, MRN [id number]  PCP:  [facility] Cardiologist:  Rozann Lesches, MD  [place] Electrophysiologist:  None   Referring MD: [facility]   No chief complaint on file. ***  History of Present Illness:    Stephanie [name] is a 86 y.o. female with a hx of anemia, arthritis, asthma, CKD stage IIIA, COPD, HTN, HLD, DMT2 and severe aortic stenosis s/p TAVR (07/01/20) who presents to clinic for follow up.     She was admitted to [facility] in 03/2020 for flash pulmonary edema that improved with IV lasix. 2D echocardiogram showed a trileaflet aortic valve with moderate calcification and thickening of the leaflets.  The mean gradient was measured at 25 mmHg with a peak gradient of 44 mmHg.  Aortic valve area by VTI was 0.81 cm with a dimensionless index of 0.26 and a stroke-volume index of 32.  Left ventricular ejection fraction was 55 to 60% with grade 2 diastolic dysfunction.  She was transferred to [facility] and underwent cardiac catheterization showing nonobstructive coronary disease with 50% left circumflex stenosis.  The aortic valve mean gradient was measured at 30 mmHg with a peak gradient of 26 mmHg.  Aortic valve area was measured at 1.56 cm.  Cardiac index was 3.3. LVEDP was 18.  Right heart pressures were within normal limits.  She was discharged home on Lasix 20 mg daily.       She was evaluated by the multidisciplinary valve team and underwent successful TAVR with a 26 mm Edwards Sapien 3 Ultra THV via the TF approach on 07/01/2020. Post operative echo showed EF 60%, normally functioning TAVR with a mean gradient of 12 mmHg and no PVL. She was discharged on aspirin and Plavix. [place] showed a new 1st deg AV block and LABF and so she was discharged with a  Zio AT. 1 month echo showed EF 45-50% (mildly decreased from previous), severe LVH, normally functioning TAVR with a mean gradient of 9 mm hg and no PVL.     She was recently admitted 6/15-6/16/23 for diverticulitis. Noted to have new onset afib and started on Eliquis (aspirin and plavix discontinued).  Also had acute on chronic diastolic CHF. Echo 07/02/21 showed EF 50-55%, moderate LVH, normally functioning TAVR with a mean gradient of 8.3 mm hg and no PVL.     Today she presents to clinic for follow up.      Severe AS s/p TAVR: echo during recent admission 07/02/21 showed EF 50-55%, moderate LVH, normally functioning TAVR with a mean gradient of 8.3 mm hg and no PVL.  She has NYHA class II symptoms. Continue aspirin alone. SBE prophylaxis discussed; I have RX'd azithromycin due to a PCN allergy     PAF:     HTN:     Renal mass: found on pre TAVR CT and followed by Dr. Alyson Ingles with urology     Lung nodule: followed by pulmonary     Chronic diastolic CHF:     Past Medical History:  Diagnosis Date   Arthritis    Asthma    CKD (chronic  kidney disease)    Essential hypertension    Mixed hyperlipidemia    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 Burt Knack and Dr. Cyndia Bent.   Severe aortic stenosis    Symptomatic anemia    Presumed GI bleed January 2021 - she declined GI work-up   Type 2 diabetes mellitus ([place])     Past Surgical History:  Procedure Laterality Date   APPENDECTOMY     BACK SURGERY     CYST REMOVAL HAND     Right, hospital in [place] ECHOCARDIOGRAM Left 07/01/2020   Procedure: INTRAOPERATIVE TRANSTHORACIC ECHOCARDIOGRAM;  Surgeon: Sherren Mocha, MD;  Location: [place];  Service: Open Heart Surgery;  Laterality: Left;   KNEE ARTHROSCOPY WITH MEDIAL MENISECTOMY Right 02/28/2012   Procedure: KNEE ARTHROSCOPY WITH MEDIAL MENISECTOMY;  Surgeon: Carole Civil,  MD;  Location[facility];  Service: Orthopedics;  Laterality: Right;   REPAIR KNEE LIGAMENT     RIGHT/LEFT HEART CATH AND CORONARY ANGIOGRAPHY N/A 04/10/2020   Procedure: RIGHT/LEFT HEART CATH AND CORONARY ANGIOGRAPHY;  Surgeon: Jettie Booze, MD;  Location: [place] [facility];  Service: Cardiovascular;  Laterality: N/A;   TRANSCATHETER AORTIC VALVE REPLACEMENT, TRANSFEMORAL N/A 07/01/2020   Procedure: TRANSCATHETER AORTIC VALVE REPLACEMENT, TRANSFEMORAL;  Surgeon: Sherren Mocha, MD;  Location: [place];  Service: Open Heart Surgery;  Laterality: N/A;   ULTRASOUND GUIDANCE FOR VASCULAR ACCESS Bilateral 07/01/2020   Procedure: ULTRASOUND GUIDANCE FOR VASCULAR ACCESS;  Surgeon: Sherren Mocha, MD;  Location: [place];  Service: Open Heart Surgery;  Laterality: Bilateral;    Current Medications: No outpatient medications have been marked as taking for the 07/13/21 encounter (Appointment) with [facility].     Allergies:   Amoxicillin, Nitrofurantoin, Penicillins, and Sulfa antibiotics   Social History   Socioeconomic History   Marital status: Divorced    Spouse name: Not on file   Number of children: 3   Years of education: Not on file   Highest education level: Not on file  Occupational History   Occupation: Retired-cook   Tobacco Use   Smoking status: Former    Years: 7.00    Types: Cigarettes   Smokeless tobacco: Never  Vaping Use   Vaping Use: Never used  Substance and Sexual Activity   Alcohol use: No   Drug use: No   Sexual activity: Never  Other Topics Concern   Not on file  Social History Narrative   ** Merged History Encounter **       Social Determinants of Health   Financial Resource Strain: Not on file  Food Insecurity: Not on file  Transportation Needs: Not on file  Physical Activity: Not on file  Stress: Not on file  Social Connections: Not on file     Family History: The patient's family history includes Arthritis in an other family  member; Asthma in an other family member; Cancer in an other family member; Diabetes in her brother, father, mother, and another family member; Diverticulitis in her mother; Hypertension in her mother and sister; Lung disease in an other family member. There is no history of Colon cancer.  ROS:   Please see the history of present illness.    All other systems reviewed and are negative.  EKGs/Labs/Other Studies Reviewed:    The following studies were reviewed today:  Echocardiogram 07/02/21:   1. Left ventricular ejection fraction, by estimation, is  50 to 55%. Left  ventricular ejection fraction by 2D MOD biplane is 51.9 %. The left  ventricle has low normal function. The left ventricle has no regional wall  motion abnormalities. There is  moderate left ventricular hypertrophy. Left ventricular diastolic  parameters are consistent with Grade I diastolic dysfunction (impaired  relaxation).   2. Right ventricular systolic function is normal. The right ventricular  size is normal. There is mildly elevated pulmonary artery systolic  pressure. The estimated right ventricular systolic pressure is 76.2 mmHg.   3. Left atrial size was moderately dilated.   4. Right atrial size was moderately dilated.   5. The mitral valve is abnormal. Trivial mitral valve regurgitation.   6. The aortic valve has been repaired/replaced. Aortic valve  regurgitation is not visualized. There is a 26 mm Sapien prosthetic (TAVR)  valve present in the aortic position. Procedure Date: 07/01/2020. Echo  findings are consistent with normal structure  and function of the aortic valve prosthesis. Aortic valve area, by VTI  measures 1.83 cm. Aortic valve mean gradient measures 8.3 mmHg. Aortic  valve Vmax measures 2.10 m/s.   Comparison(s): Changes from prior study are noted. 08/07/2020: LVEF 45-50%,  TAVR - mean gradient 9 mmHg.    EKG:  EKG is *** ordered today.  The ekg ordered today demonstrates ***  Recent  Labs: 07/02/2021: B Natriuretic Peptide 285.0; TSH 3.200 07/03/2021: ALT 9; BUN 23; Creatinine, Ser 1.35; Hemoglobin 12.3; Magnesium 1.8; Platelets 203; Potassium 5.0; Sodium 139   Recent Lipid Panel    Component Value Date/Time   CHOL 184 04/11/2020 0945   TRIG 283 (H) 04/11/2020 0945   HDL 46 04/11/2020 0945   CHOLHDL 4.0 04/11/2020 0945   VLDL 57 (H) 04/11/2020 0945   LDLCALC 81 04/11/2020 0945    Physical Exam:    VS:  There were no vitals taken for this visit.    Wt Readings from Last 3 Encounters:  07/03/21 193 lb 2 oz (87.6 kg)  07/03/21 193 lb 2 oz (87.6 kg)  05/06/21 196 lb 6.4 oz (89.1 kg)     GEN: *** Well nourished, well developed in no acute distress HEENT: Normal NECK: No JVD; No carotid bruits LYMPHATICS: No lymphadenopathy CARDIAC: ***RRR, no murmurs, rubs, gallops RESPIRATORY:  Clear to auscultation without rales, wheezing or rhonchi  ABDOMEN: Soft, non-tender, non-distended MUSCULOSKELETAL:  No edema; No deformity  SKIN: Warm and dry NEUROLOGIC:  Alert and oriented x 3 PSYCHIATRIC:  Normal affect   ASSESSMENT:    No diagnosis found. PLAN:    In order of problems listed above:       {Are you ordering a CV Procedure (e.g. stress test, cath, DCCV, TEE, etc)?   Press F2        :[id number]}    Medication Adjustments/Labs and Tests Ordered: Current medicines are reviewed at length with the patient today.  Concerns regarding medicines are outlined above.  No orders of the defined types were placed in this encounter.  No orders of the defined types were placed in this encounter.   There are no Patient Instructions on file for this visit.   Signed, Kathyrn Drown, NP  07/08/2021 2:44 PM    [place]

## 2021-07-09 ENCOUNTER — Emergency Department (HOSPITAL_COMMUNITY)
Admission: EM | Admit: 2021-07-09 | Discharge: 2021-07-09 | Disposition: A | Discharge status: Home / Self Care | Payer: Medicare PPO | Attending: Emergency Medicine | Admitting: Emergency Medicine

## 2021-07-09 ENCOUNTER — Encounter (HOSPITAL_COMMUNITY): Payer: Self-pay | Admitting: Emergency Medicine

## 2021-07-09 DIAGNOSIS — Z7984 Long term (current) use of oral hypoglycemic drugs: Secondary | ICD-10-CM | POA: Insufficient documentation

## 2021-07-09 DIAGNOSIS — Z794 Long term (current) use of insulin: Secondary | ICD-10-CM | POA: Diagnosis not present

## 2021-07-09 DIAGNOSIS — N189 Chronic kidney disease, unspecified: Secondary | ICD-10-CM | POA: Insufficient documentation

## 2021-07-09 DIAGNOSIS — D72829 Elevated white blood cell count, unspecified: Secondary | ICD-10-CM | POA: Insufficient documentation

## 2021-07-09 DIAGNOSIS — Z7901 Long term (current) use of anticoagulants: Secondary | ICD-10-CM | POA: Diagnosis not present

## 2021-07-09 DIAGNOSIS — Z79899 Other long term (current) drug therapy: Secondary | ICD-10-CM | POA: Diagnosis not present

## 2021-07-09 DIAGNOSIS — N289 Disorder of kidney and ureter, unspecified: Secondary | ICD-10-CM

## 2021-07-09 DIAGNOSIS — R002 Palpitations: Secondary | ICD-10-CM | POA: Diagnosis present

## 2021-07-09 DIAGNOSIS — E119 Type 2 diabetes mellitus without complications: Secondary | ICD-10-CM | POA: Insufficient documentation

## 2021-07-09 DIAGNOSIS — I129 Hypertensive chronic kidney disease with stage 1 through stage 4 chronic kidney disease, or unspecified chronic kidney disease: Secondary | ICD-10-CM | POA: Insufficient documentation

## 2021-07-09 LAB — BASIC METABOLIC PANEL
Anion gap: 9 (ref 5–15)
BUN: 33 mg/dL — ABNORMAL HIGH (ref 8–23)
CO2: 28 mmol/L (ref 22–32)
Calcium: 9.8 mg/dL (ref 8.9–10.3)
Chloride: 100 mmol/L (ref 98–111)
Creatinine, Ser: 1.57 mg/dL — ABNORMAL HIGH (ref 0.44–1.00)
GFR, Estimated: 33 mL/min — ABNORMAL LOW (ref >60)
Glucose, Bld: 103 mg/dL — ABNORMAL HIGH (ref 70–99)
Potassium: 4.3 mmol/L (ref 3.5–5.1)
Sodium: 137 mmol/L (ref 135–145)

## 2021-07-09 LAB — CBC WITH DIFFERENTIAL/PLATELET
Abs Immature Granulocytes: 0.04 10*3/uL (ref 0.00–0.07)
Basophils Absolute: 0 10*3/uL (ref 0.0–0.1)
Basophils Relative: 0 %
Eosinophils Absolute: 0.2 10*3/uL (ref 0.0–0.5)
Eosinophils Relative: 2 %
HCT: 40.7 % (ref 36.0–46.0)
Hemoglobin: 12.7 g/dL (ref 12.0–15.0)
Immature Granulocytes: 0 %
Lymphocytes Relative: 9 %
Lymphs Abs: 1.1 10*3/uL (ref 0.7–4.0)
MCH: 27.5 pg (ref 26.0–34.0)
MCHC: 31.2 g/dL (ref 30.0–36.0)
MCV: 88.1 fL (ref 80.0–100.0)
Monocytes Absolute: 1.2 10*3/uL — ABNORMAL HIGH (ref 0.1–1.0)
Monocytes Relative: 10 %
Neutro Abs: 9.2 10*3/uL — ABNORMAL HIGH (ref 1.7–7.7)
Neutrophils Relative %: 79 %
Platelets: 212 10*3/uL (ref 150–400)
RBC: 4.62 MIL/uL (ref 3.87–5.11)
RDW: 15.7 % — ABNORMAL HIGH (ref 11.5–15.5)
WBC: 11.8 10*3/uL — ABNORMAL HIGH (ref 4.0–10.5)
nRBC: 0 % (ref 0.0–0.2)

## 2021-07-09 LAB — MAGNESIUM: Magnesium: 1.8 mg/dL (ref 1.7–2.4)

## 2021-07-09 MED ORDER — MAGNESIUM SULFATE 2 GM/50ML IV SOLN
2.0000 g | Freq: Once | INTRAVENOUS | Status: AC
  Administered 2021-07-09: 2 g via INTRAVENOUS
  Filled 2021-07-09: qty 50

## 2021-07-09 MED ORDER — LACTATED RINGERS IV BOLUS
1000.0000 mL | Freq: Once | INTRAVENOUS | Status: AC
  Administered 2021-07-09: 1000 mL via INTRAVENOUS

## 2021-07-09 NOTE — ED Triage Notes (Signed)
Pt arrives [facility] from home c/o palpitations this morning. Pt denies CP or SOB. Pt also c/o right arm pain that has been going on for several months, was dx with arthritis.

## 2021-07-13 ENCOUNTER — Ambulatory Visit: Payer: Medicare PPO

## 2021-07-17 NOTE — Progress Notes (Unsigned)
[place]                                     Cardiology Office Note:    Date:  07/22/2021   ID:  Stephanie [name], DOB 28-Jan-1935, MRN [id number]  PCP:  [facility] Cardiologist:  Rozann Lesches, MD  [place] Electrophysiologist:  None   Referring MD: [facility]*   Chief Complaint  Patient presents with   Follow-up    1 year s/p TAVR    History of Present Illness:    Stephanie [name] is a 86 y.o. female with a hx of anemia, arthritis, asthma,  CKD stage IIIA, COPD, HTN, HLD, DMT2 and severe aortic stenosis s/p TAVR (07/01/20) who presents to clinic for follow up.    She was admitted to [facility] in 03/2020 for flash pulmonary edema that improved with IV lasix. 2D echocardiogram showed a trileaflet aortic valve with moderate calcification and thickening of the leaflets.  The mean gradient was measured at 25 mmHg with a peak gradient of 44 mmHg.  Aortic valve area by VTI was 0.81 cm with a dimensionless index of 0.26 and a stroke-volume index of 32.  Left ventricular ejection fraction was 55 to 60% with grade 2 diastolic dysfunction.  She was transferred to [facility] and underwent cardiac catheterization showing nonobstructive coronary disease with 50% left circumflex stenosis.  The aortic valve mean gradient was measured at 30 mmHg with a peak gradient of 26 mmHg.  Aortic valve area was measured at 1.56 cm.  Cardiac index was 3.3.  LVEDP was 18.  Right heart pressures were within normal limits.  She was discharged home on Lasix 20 mg daily.     She was evaluated by the multidisciplinary valve team and underwent successful TAVR with a 26 mm Edwards Sapien 3 Ultra THV via the TF approach on 07/01/2020. Post operative echo showed EF 60%, normally functioning TAVR with a mean gradient of 12 mmHg and no PVL. She was discharged on aspirin and Plavix. [place] showed a new 1st deg AV block  and LABF and so she was discharged with a Zio AT.    At her last visit she had LE edema and was hypertensive. Norvasc was discontinued and lasix increased from 20mg  to 40mg  daily and hydralazine increased to 50mg  TID.   She was then seen by Dr. Domenic Polite 12/09/2020 at which time she was noted to be stable wit no acute issues or concerns. In follow up several months later on 05/06/21 she was having intermittent somatic complaints, not necessarily related to her heart. She has been seen several other times in the ED for various reasons. The structural heart team has had difficulty with getting her to come to appointments. Her echocardiogram was recently updated 07/02/21 within the [facility] window for TAVR which showed normal LVEF at 50-55% with presence of stable Sapien 46mm valve with AVA by VTI at 1.83cm2, mean gradient 8.4mmHg, and peak 17.38mmHg.   She was recently hospitalized for acute diverticulitis and found to have new onset AF. She was started on Eliquis and treated with antibiotics by GI/IM team. She is to follow with PCP this week and has follow up with Dr. Domenic Polite 10/14/21. Her ASA and Plavix were stopped. Today she is here with her daughter. She denies chest pain, SOB,  LE edema, orthopnea, palpitations, dizziness, or syncope.   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 Burt Knack and Dr. Cyndia Bent.   Severe aortic stenosis    Symptomatic anemia    Presumed GI bleed January 2021 - she declined GI work-up   Type 2 diabetes mellitus ([place])     Past Surgical History:  Procedure Laterality Date   APPENDECTOMY     BACK SURGERY     CYST REMOVAL HAND     Right, hospital in [place] ECHOCARDIOGRAM Left 07/01/2020   Procedure: INTRAOPERATIVE TRANSTHORACIC  ECHOCARDIOGRAM;  Surgeon: Sherren Mocha, MD;  Location: [place];  Service: Open Heart Surgery;  Laterality: Left;   KNEE ARTHROSCOPY WITH MEDIAL MENISECTOMY Right 02/28/2012   Procedure: KNEE ARTHROSCOPY WITH MEDIAL MENISECTOMY;  Surgeon: Carole Civil, MD;  Location: [facility];  Service: Orthopedics;  Laterality: Right;   REPAIR KNEE LIGAMENT     RIGHT/LEFT HEART CATH AND CORONARY ANGIOGRAPHY N/A 04/10/2020   Procedure: RIGHT/LEFT HEART CATH AND CORONARY ANGIOGRAPHY;  Surgeon: Jettie Booze, MD;  Location: [place] CV [facility];  Service: Cardiovascular;  Laterality: N/A;   TRANSCATHETER AORTIC VALVE REPLACEMENT, TRANSFEMORAL N/A 07/01/2020   Procedure: TRANSCATHETER AORTIC VALVE REPLACEMENT, TRANSFEMORAL;  Surgeon: Sherren Mocha, MD;  Location: [place];  Service: Open Heart Surgery;  Laterality: N/A;   ULTRASOUND GUIDANCE FOR VASCULAR ACCESS Bilateral 07/01/2020   Procedure: ULTRASOUND GUIDANCE FOR VASCULAR ACCESS;  Surgeon: Sherren Mocha, MD;  Location: [place];  Service: Open Heart Surgery;  Laterality: Bilateral;    Current Medications: Current Meds  Medication Sig   albuterol (PROVENTIL) (2.5 MG/3ML) 0.083% nebulizer solution Take 2.5 mg by nebulization every 6 (six) hours as needed for wheezing or shortness of breath.    allopurinol (ZYLOPRIM) 300 MG tablet Take 300 mg by mouth daily.   apixaban (ELIQUIS) 5 MG TABS tablet Take 1 tablet (5 mg total) by mouth 2 (two) times daily.   carvedilol (COREG) 6.25 MG tablet Take 1 tablet (6.25 mg total) by mouth 2 (two) times daily.   diclofenac Sodium (VOLTAREN) 1 % GEL Apply 2 g topically 4 (four) times daily as needed (pain).   ferrous sulfate 325 (65 FE) MG EC tablet Take 325 mg by mouth daily with breakfast.   furosemide (LASIX) 40 MG tablet TAKE 1 TABLET BY MOUTH ONCE DAILY.   gabapentin (NEURONTIN) 100 MG capsule Take 100-200 mg by mouth at bedtime.   Insulin Detemir (LEVEMIR FLEXTOUCH) 100 UNIT/ML Pen Inject 30 Units into the skin at  bedtime.   meloxicam (MOBIC) 15 MG tablet Take 15 mg by mouth daily.   metFORMIN (GLUCOPHAGE) 500 MG tablet Take 1 tablet (500 mg total) by mouth 2 (two) times daily.   traMADol (ULTRAM) 50 MG tablet Take 50 mg by mouth daily as needed for moderate pain.     Allergies:   Amoxicillin, Penicillins, Sulfa antibiotics, and Nitrofurantoin   Social History   Socioeconomic History   Marital status: Divorced    Spouse name: Not on file   Number of children: 3   Years of education: Not on file   Highest education level: Not on file  Occupational History   Occupation: Retired-cook   Tobacco Use  Smoking status: Former    Years: 7.00    Types: Cigarettes   Smokeless tobacco: Never  Vaping Use   Vaping Use: Never used  Substance and Sexual Activity   Alcohol use: No   Drug use: No   Sexual activity: Never  Other Topics Concern   Not on file  Social History Narrative   ** Merged History Encounter **       Social Determinants of Health   Financial Resource Strain: Not on file  Food Insecurity: Not on file  Transportation Needs: Not on file  Physical Activity: Not on file  Stress: Not on file  Social Connections: Not on file     Family History: The patient's family history includes Arthritis in an other family member; Asthma in an other family member; Cancer in an other family member; Diabetes in her brother, father, mother, and another family member; Diverticulitis in her mother; Hypertension in her mother and sister; Lung disease in an other family member. There is no history of Colon cancer.  ROS:   Please see the history of present illness.    All other systems reviewed and are negative.  EKGs/Labs/Other Studies Reviewed:    The following studies were reviewed today:  Echocardiogram 07/02/21:  Left ventricular ejection fraction, by estimation, is 50 to 55%. Left ventricular ejection fraction by 2D MOD biplane is 51.9 %. The left ventricle has low normal function.  The left ventricle has no regional wall motion abnormalities. There is moderate left ventricular hypertrophy. Left ventricular diastolic parameters are consistent with Grade I diastolic dysfunction (impaired relaxation). 1. Right ventricular systolic function is normal. The right ventricular size is normal. There is mildly elevated pulmonary artery systolic pressure. The estimated right ventricular systolic pressure is 70.3 mmHg. 2. 3. Left atrial size was moderately dilated. 4. Right atrial size was moderately dilated. 5. The mitral valve is abnormal. Trivial mitral valve regurgitation. The aortic valve has been repaired/replaced. Aortic valve regurgitation is not visualized. There is a 26 mm Sapien prosthetic (TAVR) valve present in the aortic position. Procedure Date: 07/01/2020. Echo findings are consistent with normal structure and function of the aortic valve prosthesis. Aortic valve area, by VTI measures 1.83 cm. Aortic valve mean gradient measures 8.3 mmHg. Aortic valve Vmax measures 2.10 m/s.  TAVR OPERATIVE NOTE     Date of Procedure:                07/01/2020   Preoperative Diagnosis:      Severe Aortic Stenosis   Postoperative Diagnosis:    Same   Procedure:        Transcatheter Aortic Valve Replacement - Percutaneous Transfemoral Approach             Edwards Sapien 3 Ultra THV (size 26 mm, model # 9750TFX, serial # [id number])              Co-Surgeons:                        Gaye Pollack, MD and Sherren Mocha, MD   Anesthesiologist:                  Duane Boston, MD   Echocardiographer:              Jenkins Rouge, MD   Pre-operative Echo Findings: Severe aortic stenosis Normal left ventricular systolic function   Post-operative Echo Findings: No paravalvular leak Normal/unchanged left ventricular systolic function _____________   Echo 07/02/2020:  IMPRESSIONS   1. Left ventricular ejection fraction, by estimation, is 60 to 65%. The  left ventricle has  normal function. The left ventricle has no regional  wall motion abnormalities. There is severe left ventricular hypertrophy.  Left ventricular diastolic parameters   are consistent with Grade I diastolic dysfunction (impaired relaxation).   2. Right ventricular systolic function is normal. The right ventricular  size is normal.   3. Left atrial size was mildly dilated.   4. The mitral valve is degenerative. Trivial mitral valve regurgitation.  No evidence of mitral stenosis. Moderate mitral annular calcification.   5. Post TAVR 26 mm Sapien 3 valve 07/01/20 no PVL mean gradient 12 peak 23  mmHg with AVA 2.4 cm2 and DVI 0.52. The aortic valve has been  repaired/replaced. Aortic valve regurgitation is not visualized. No aortic  stenosis is present. There is a 26 mm  Sapien prosthetic (TAVR) valve present in the aortic position. Procedure  Date: 07/01/20.   6. The inferior vena cava is normal in size with greater than 50%  respiratory variability, suggesting right atrial pressure of 3 mmHg.    _______________________     Echo 08/07/20 IMPRESSIONS  1. Mild global reduction in LV systolic function; s/p TAVR (mean gradient 9 mmHg, peak velocity 2.1 m/s; DI 0.38; no AI); compared to 07/01/20, LV function slightly reduced.  2. Left ventricular ejection fraction, by estimation, is 45 to 50%. The left ventricle has mildly decreased function. The left ventricle demonstrates global hypokinesis. There is severe left ventricular hypertrophy. Left ventricular diastolic parameters are consistent with Grade I diastolic dysfunction (impaired relaxation).  3. Right ventricular systolic function is normal. The right ventricular size is normal.  4. Left atrial size was mildly dilated.  5. The mitral valve is normal in structure. No evidence of mitral valve regurgitation. No evidence of mitral stenosis.  6. The aortic valve has been repaired/replaced. Aortic valve regurgitation is not visualized. No aortic  stenosis is present. There is a 26 mm Sapien prosthetic (TAVR) valve present in the aortic position. Procedure Date: 07/01/2020.  7. The inferior vena cava is normal in size with greater than 50% respiratory variability, suggesting right atrial pressure of 3 mmHg.  EKG:  EKG is not ordered today.    Recent Labs: 07/02/2021: B Natriuretic Peptide 285.0; TSH 3.200 07/03/2021: ALT 9 07/09/2021: Magnesium 1.8 07/20/2021: BUN 24; Creatinine, Ser 1.33; Hemoglobin 12.3; Platelets 232; Potassium 4.7; Sodium 139  Recent Lipid Panel    Component Value Date/Time   CHOL 184 04/11/2020 0945   TRIG 283 (H) 04/11/2020 0945   HDL 46 04/11/2020 0945   CHOLHDL 4.0 04/11/2020 0945   VLDL 57 (H) 04/11/2020 0945   LDLCALC 81 04/11/2020 0945   Physical Exam:    VS:  BP 100/62   Pulse 76   Ht 5\' 5"  (1.651 m)   Wt 188 lb (85.3 kg)   SpO2 93%   BMI 31.28 kg/m     Wt Readings from Last 3 Encounters:  07/20/21 188 lb (85.3 kg)  07/09/21 193 lb 2 oz (87.6 kg)  07/03/21 193 lb 2 oz (87.6 kg)    General: Well developed, well nourished, NAD Lungs:Clear to ausculation bilaterally. Breathing is unlabored. Cardiovascular: RRR with S1 S2. No murmurs Extremities: No edema.  Neuro: Alert and oriented. No focal deficits. No facial asymmetry. MAE spontaneously. Psych: Responds to questions appropriately with normal affect.    ASSESSMENT/PLAN:    Severe AS s/p TAVR: Doing very well with no specific  complaints. Echo from 07/02/21 with AVA by VTI at 1.83 cm and mean gradient at 8.3 mmHg. No ASA in the setting of Eliquis. Requires lifelong SBE with Azithromycin due to PCN allergy. Plan to follow with Dr. Domenic Polite for cardiology care.     Paroxysmal atrial fibrillation: Felt to be in the setting of acute diverticulitis during recent hospitalization. Started on Eliquis. Creatinine improved on labs to 1.33. CBC stable with Hb at 12.3  HTN: Stable with no changes needed today.    Renal mass: Found on pre TAVR CT and  followed by Dr. Alyson Ingles with urology   Lung nodule: Follow up CT 11/2020 with 6 month stability of an anterior right lower lobe 1.4 cm pulmonary nodule. This is reassuring, but the nodule remains technically indeterminate. Recommend chest CT follow-up at 6-12 Will deferred further follow up to PCP.    Medication Adjustments/Labs and Tests Ordered: Current medicines are reviewed at length with the patient today.  Concerns regarding medicines are outlined above.  Orders Placed This Encounter  Procedures   Basic metabolic panel   CBC   No orders of the defined types were placed in this encounter.   Patient Instructions  Medication Instructions:  Your physician recommends that you continue on your current medications as directed. Please refer to the Current Medication list given to you today.  *If you need a refill on your cardiac medications before your next appointment, please call your pharmacy*   Lab Work: TODAY: BMET, CBC If you have labs (blood work) drawn today and your tests are completely normal, you will receive your results only by: [place] (if you have MyChart) OR A paper copy in the mail If you have any lab test that is abnormal or we need to change your treatment, we will call you to review the results.   Testing/Procedures: NONE   Follow-Up: At [facility], you and your health needs are our priority.  As part of our continuing mission to provide you with exceptional heart care, we have created designated Provider Care Teams.  These Care Teams include your primary Cardiologist (physician) and Advanced Practice Providers (APPs -  Physician Assistants and Nurse Practitioners) who all work together to provide you with the care you need, when you need it.  We recommend signing up for the patient portal called "MyChart".  Sign up information is provided on this After Visit Summary.  MyChart is used to connect with patients for Virtual Visits (Telemedicine).   Patients are able to view lab/test results, encounter notes, upcoming appointments, etc.  Non-urgent messages can be sent to your provider as well.   To learn more about what you can do with MyChart, go to [email or web address].    Your next appointment:   KEEP SCHEDULED APPOINTMENT    Important Information About Sugar         Signed, Kathyrn Drown, NP  07/22/2021 2:50 PM    [facility]

## 2021-07-20 ENCOUNTER — Ambulatory Visit: Payer: Medicare PPO | Admitting: Cardiology

## 2021-07-20 VITALS — BP 100/62 | HR 76 | Ht 65.0 in | Wt 188.0 lb

## 2021-07-20 DIAGNOSIS — I350 Nonrheumatic aortic (valve) stenosis: Secondary | ICD-10-CM

## 2021-07-20 DIAGNOSIS — R911 Solitary pulmonary nodule: Secondary | ICD-10-CM

## 2021-07-20 DIAGNOSIS — I10 Essential (primary) hypertension: Secondary | ICD-10-CM | POA: Diagnosis not present

## 2021-07-20 DIAGNOSIS — E1169 Type 2 diabetes mellitus with other specified complication: Secondary | ICD-10-CM

## 2021-07-20 DIAGNOSIS — Z952 Presence of prosthetic heart valve: Secondary | ICD-10-CM

## 2021-07-20 DIAGNOSIS — N2889 Other specified disorders of kidney and ureter: Secondary | ICD-10-CM

## 2021-07-20 NOTE — Patient Instructions (Signed)
Medication Instructions:  Your physician recommends that you continue on your current medications as directed. Please refer to the Current Medication list given to you today.  *If you need a refill on your cardiac medications before your next appointment, please call your pharmacy*   Lab Work: TODAY: BMET, CBC If you have labs (blood work) drawn today and your tests are completely normal, you will receive your results only by: [place] (if you have MyChart) OR A paper copy in the mail If you have any lab test that is abnormal or we need to change your treatment, we will call you to review the results.   Testing/Procedures: NONE   Follow-Up: At [facility], you and your health needs are our priority.  As part of our continuing mission to provide you with exceptional heart care, we have created designated Provider Care Teams.  These Care Teams include your primary Cardiologist (physician) and Advanced Practice Providers (APPs -  Physician Assistants and Nurse Practitioners) who all work together to provide you with the care you need, when you need it.  We recommend signing up for the patient portal called "MyChart".  Sign up information is provided on this After Visit Summary.  MyChart is used to connect with patients for Virtual Visits (Telemedicine).  Patients are able to view lab/test results, encounter notes, upcoming appointments, etc.  Non-urgent messages can be sent to your provider as well.   To learn more about what you can do with MyChart, go to [email or web address].    Your next appointment:   KEEP SCHEDULED APPOINTMENT    Important Information About Sugar

## 2021-07-21 LAB — CBC
Hematocrit: 38.5 % (ref 34.0–46.6)
Hemoglobin: 12.3 g/dL (ref 11.1–15.9)
MCH: 27.2 pg (ref 26.6–33.0)
MCHC: 31.9 g/dL (ref 31.5–35.7)
MCV: 85 fL (ref 79–97)
Platelets: 232 10*3/uL (ref 150–450)
RBC: 4.53 x10E6/uL (ref 3.77–5.28)
RDW: 14.9 % (ref 11.7–15.4)
WBC: 7.8 10*3/uL (ref 3.4–10.8)

## 2021-07-21 LAB — BASIC METABOLIC PANEL
BUN/Creatinine Ratio: 18 (ref 12–28)
BUN: 24 mg/dL (ref 8–27)
CO2: 23 mmol/L (ref 20–29)
Calcium: 10.3 mg/dL (ref 8.7–10.3)
Chloride: 99 mmol/L (ref 96–106)
Creatinine, Ser: 1.33 mg/dL — ABNORMAL HIGH (ref 0.57–1.00)
Glucose: 168 mg/dL — ABNORMAL HIGH (ref 70–99)
Potassium: 4.7 mmol/L (ref 3.5–5.2)
Sodium: 139 mmol/L (ref 134–144)
eGFR: 40 mL/min/{1.73_m2} — ABNORMAL LOW (ref >59)

## 2021-07-28 ENCOUNTER — Other Ambulatory Visit (HOSPITAL_COMMUNITY): Payer: Self-pay | Admitting: Family Medicine

## 2021-07-28 DIAGNOSIS — R1031 Right lower quadrant pain: Secondary | ICD-10-CM

## 2021-07-29 ENCOUNTER — Ambulatory Visit: Payer: Medicare Other | Admitting: Pulmonary Disease

## 2021-08-03 ENCOUNTER — Other Ambulatory Visit: Payer: Self-pay | Admitting: Physician Assistant

## 2021-08-03 DIAGNOSIS — Z952 Presence of prosthetic heart valve: Secondary | ICD-10-CM

## 2021-08-12 ENCOUNTER — Other Ambulatory Visit (INDEPENDENT_AMBULATORY_CARE_PROVIDER_SITE_OTHER): Payer: Self-pay | Admitting: Nurse Practitioner

## 2021-08-12 DIAGNOSIS — I739 Peripheral vascular disease, unspecified: Secondary | ICD-10-CM

## 2021-08-13 ENCOUNTER — Ambulatory Visit (INDEPENDENT_AMBULATORY_CARE_PROVIDER_SITE_OTHER): Payer: Medicare Other | Admitting: Nurse Practitioner

## 2021-08-13 ENCOUNTER — Encounter (INDEPENDENT_AMBULATORY_CARE_PROVIDER_SITE_OTHER): Payer: Medicare Other

## 2021-08-14 ENCOUNTER — Ambulatory Visit (HOSPITAL_COMMUNITY): Payer: Medicare PPO

## 2021-09-15 ENCOUNTER — Encounter (HOSPITAL_COMMUNITY): Payer: Self-pay

## 2021-09-15 ENCOUNTER — Ambulatory Visit (HOSPITAL_COMMUNITY): Payer: Medicare PPO

## 2021-09-17 ENCOUNTER — Ambulatory Visit (INDEPENDENT_AMBULATORY_CARE_PROVIDER_SITE_OTHER): Payer: Self-pay

## 2021-09-17 ENCOUNTER — Ambulatory Visit (INDEPENDENT_AMBULATORY_CARE_PROVIDER_SITE_OTHER): Payer: Medicare Other | Admitting: Nurse Practitioner

## 2021-09-17 DIAGNOSIS — I739 Peripheral vascular disease, unspecified: Secondary | ICD-10-CM

## 2021-09-29 ENCOUNTER — Other Ambulatory Visit: Payer: Self-pay

## 2021-09-29 ENCOUNTER — Encounter (HOSPITAL_COMMUNITY): Payer: Self-pay

## 2021-09-29 ENCOUNTER — Emergency Department (HOSPITAL_COMMUNITY)
Admission: EM | Admit: 2021-09-29 | Discharge: 2021-09-29 | Disposition: A | Discharge status: Home / Self Care | Payer: Medicare PPO | Attending: Emergency Medicine | Admitting: Emergency Medicine

## 2021-09-29 DIAGNOSIS — E119 Type 2 diabetes mellitus without complications: Secondary | ICD-10-CM

## 2021-09-29 DIAGNOSIS — N1832 Chronic kidney disease, stage 3b: Secondary | ICD-10-CM | POA: Diagnosis not present

## 2021-09-29 DIAGNOSIS — Z7984 Long term (current) use of oral hypoglycemic drugs: Secondary | ICD-10-CM | POA: Diagnosis not present

## 2021-09-29 DIAGNOSIS — I10 Essential (primary) hypertension: Secondary | ICD-10-CM

## 2021-09-29 DIAGNOSIS — R531 Weakness: Secondary | ICD-10-CM | POA: Diagnosis present

## 2021-09-29 DIAGNOSIS — E1122 Type 2 diabetes mellitus with diabetic chronic kidney disease: Secondary | ICD-10-CM | POA: Insufficient documentation

## 2021-09-29 DIAGNOSIS — Z794 Long term (current) use of insulin: Secondary | ICD-10-CM | POA: Insufficient documentation

## 2021-09-29 DIAGNOSIS — Z79899 Other long term (current) drug therapy: Secondary | ICD-10-CM | POA: Insufficient documentation

## 2021-09-29 DIAGNOSIS — N390 Urinary tract infection, site not specified: Secondary | ICD-10-CM | POA: Insufficient documentation

## 2021-09-29 DIAGNOSIS — I129 Hypertensive chronic kidney disease with stage 1 through stage 4 chronic kidney disease, or unspecified chronic kidney disease: Secondary | ICD-10-CM | POA: Insufficient documentation

## 2021-09-29 LAB — CBC
HCT: 39.6 % (ref 36.0–46.0)
Hemoglobin: 12.5 g/dL (ref 12.0–15.0)
MCH: 27.3 pg (ref 26.0–34.0)
MCHC: 31.6 g/dL (ref 30.0–36.0)
MCV: 86.5 fL (ref 80.0–100.0)
Platelets: 229 10*3/uL (ref 150–400)
RBC: 4.58 MIL/uL (ref 3.87–5.11)
RDW: 15.3 % (ref 11.5–15.5)
WBC: 9.8 10*3/uL (ref 4.0–10.5)
nRBC: 0 % (ref 0.0–0.2)

## 2021-09-29 LAB — URINALYSIS, ROUTINE W REFLEX MICROSCOPIC
Bilirubin Urine: NEGATIVE
Glucose, UA: NEGATIVE mg/dL
Ketones, ur: NEGATIVE mg/dL
Nitrite: NEGATIVE
Protein, ur: NEGATIVE mg/dL
Specific Gravity, Urine: 1.025 (ref 1.005–1.030)
pH: 5.5 (ref 5.0–8.0)

## 2021-09-29 LAB — BASIC METABOLIC PANEL
Anion gap: 10 (ref 5–15)
BUN: 26 mg/dL — ABNORMAL HIGH (ref 8–23)
CO2: 28 mmol/L (ref 22–32)
Calcium: 11 mg/dL — ABNORMAL HIGH (ref 8.9–10.3)
Chloride: 102 mmol/L (ref 98–111)
Creatinine, Ser: 1.38 mg/dL — ABNORMAL HIGH (ref 0.44–1.00)
GFR, Estimated: 38 mL/min — ABNORMAL LOW (ref >60)
Glucose, Bld: 97 mg/dL (ref 70–99)
Potassium: 4.8 mmol/L (ref 3.5–5.1)
Sodium: 140 mmol/L (ref 135–145)

## 2021-09-29 LAB — URINALYSIS, MICROSCOPIC (REFLEX): WBC, UA: >50 WBC/hpf (ref 0–5)

## 2021-09-29 MED ORDER — SODIUM CHLORIDE 0.9 % IV SOLN
1.0000 g | Freq: Once | INTRAVENOUS | Status: AC
  Administered 2021-09-29: 1 g via INTRAVENOUS
  Filled 2021-09-29: qty 10

## 2021-09-29 MED ORDER — SODIUM CHLORIDE 0.9 % IV BOLUS
1000.0000 mL | Freq: Once | INTRAVENOUS | Status: AC
  Administered 2021-09-29: 1000 mL via INTRAVENOUS

## 2021-09-29 MED ORDER — CEPHALEXIN 500 MG PO CAPS: 500.0000 mg | ORAL_CAPSULE | Freq: Four times a day (QID) | ORAL | 0 refills | Status: DC

## 2021-09-29 NOTE — Discharge Instructions (Addendum)
It was our pleasure to provide your ER care today - we hope that you feel better.  Drink plenty of fluids/stay well hydrated.   Take keflex (antibiotic) as prescribed.   Follow up with primary care doctor in the coming week.  Return to ER if worse, new symptoms, fevers, severe abdominal pain, persistent vomiting, chest pain, trouble breathing, weak/fainting, or other concern.

## 2021-09-29 NOTE — ED Notes (Signed)
Brought pt to room Asked pt for UA sample- pt stated she went recently while in lobby

## 2021-09-29 NOTE — ED Triage Notes (Signed)
Pt presents to ED states she feels weak all over, states she is concerned everytime she sits down she goes to sleep. This has been ongoing for 3 weeks.

## 2021-09-29 NOTE — ED Provider Notes (Signed)
[facility] EMERGENCY DEPARTMENT Provider Note   CSN: [id number] Arrival date & time: 09/29/21  1543     History  Chief Complaint  Patient presents with   Weakness    Stephanie [name] is a 86 y.o. female.  Patient c/o general tiredness, weakness in the past 2-3 weeks. Symptoms gradual onset, mild-mod, persistent. No focal or unilateral numbness/weakness. No change in speech or vision. Denies headaches. No chest pain or discomfort. No sob or unusual doe. No cough or uri symptoms. No abd pain or nvd. No rectal bleeding or melena. No dysuria. Notes hx uti. No extremity pain or swelling. No rash. Denies change in meds.   The history is provided by the patient, medical records and a relative.  Weakness Associated symptoms: no abdominal pain, no chest pain, no cough, no diarrhea, no dysuria, no fever, no headaches, no shortness of breath and no vomiting        Home Medications Prior to Admission medications   Medication Sig Start Date End Date Taking? Authorizing Provider  albuterol (PROVENTIL) (2.5 MG/3ML) 0.083% nebulizer solution Take 2.5 mg by nebulization every 6 (six) hours as needed for wheezing or shortness of breath.  10/20/17  Yes [provider]  allopurinol (ZYLOPRIM) 300 MG tablet Take 300 mg by mouth daily. 06/24/20  Yes [provider]  carvedilol (COREG) 6.25 MG tablet Take 1 tablet (6.25 mg total) by mouth 2 (two) times daily. 05/06/21  Yes Imogene Burn, PA-C  cephALEXin (KEFLEX) 500 MG capsule Take 1 capsule (500 mg total) by mouth 4 (four) times daily. 09/29/21  Yes Lajean Saver, MD  diclofenac Sodium (VOLTAREN) 1 % GEL Apply 2 g topically 4 (four) times daily as needed (pain). 05/20/21  Yes [provider]  ferrous sulfate 325 (65 FE) MG EC tablet Take 325 mg by mouth daily with breakfast.   Yes [provider]  fluticasone (FLONASE) 50 MCG/ACT nasal spray Place 2 sprays into both nostrils 2 (two) times daily. 08/20/21  Yes [provider]  furosemide (LASIX) 40 MG tablet TAKE 1 TABLET BY MOUTH ONCE DAILY. 03/10/21  Yes Satira Sark, MD  gabapentin (NEURONTIN) 100 MG capsule Take 100-200 mg by mouth at bedtime. 10/02/19  Yes [provider]  GNP MUCUS RELIEF 400 MG TABS tablet Take 400 mg by mouth every 4 (four) hours as needed. 08/27/21  Yes [provider]  HYDROcodone-acetaminophen (NORCO/VICODIN) 5-325 MG tablet TAKE 1 TABLET BY MOUTH ONCE DAILY AS NEEDED FOR PAIN once a day for 30 days 08/24/21  Yes [provider]  Insulin Detemir (LEVEMIR FLEXTOUCH) 100 UNIT/ML Pen Inject 30 Units into the skin at bedtime. 02/10/19  Yes Johnson, Clanford L, MD  metFORMIN (GLUCOPHAGE) 500 MG tablet Take 1 tablet (500 mg total) by mouth 2 (two) times daily. 07/02/20  Yes Eileen Stanford, PA-C  traMADol (ULTRAM) 50 MG tablet Take 50 mg by mouth daily as needed for moderate pain. 05/20/21  Yes [provider]  apixaban (ELIQUIS) 5 MG TABS tablet Take 1 tablet (5 mg total) by mouth 2 (two) times daily. Patient not taking: Reported on 09/29/2021 07/03/21 09/01/21  Heath Lark D, DO      Allergies    Amoxicillin, Penicillins, Sulfa antibiotics, and Nitrofurantoin    Review of Systems   Review of Systems  Constitutional:  Negative for chills, diaphoresis and fever.  HENT:  Negative for sore throat.   Eyes:  Negative for redness and visual disturbance.  Respiratory:  Negative  for cough and shortness of breath.   Cardiovascular:  Negative for chest pain, palpitations and leg swelling.  Gastrointestinal:  Negative for abdominal pain, blood in stool, diarrhea and vomiting.  Genitourinary:  Negative for dysuria and flank pain.  Musculoskeletal:  Negative for back pain and neck pain.  Skin:  Negative for rash.  Neurological:  Positive for weakness. Negative for headaches.  Hematological:        Denies abnormal bruising or bleeding. No blood loss.   Psychiatric/Behavioral:  Negative for confusion.      Physical Exam Updated Vital Signs BP (!) 166/86   Pulse 73   Temp 98.5 F (36.9 C) (Oral)   Resp 18   Ht 1.651 m (5\' 5" )   Wt 95.3 kg   SpO2 98%   BMI 34.95 kg/m  Physical Exam Vitals and nursing note reviewed.  Constitutional:      Appearance: Normal appearance. She is well-developed.  HENT:     Head: Atraumatic.     Nose: Nose normal.     Mouth/Throat:     Mouth: Mucous membranes are moist.  Eyes:     General: No scleral icterus.    Conjunctiva/sclera: Conjunctivae normal.     Pupils: Pupils are equal, round, and reactive to light.  Neck:     Vascular: No carotid bruit.     Trachea: No tracheal deviation.     Comments: No stiffness or rigidity. Thyroid not grossly enlarged or tender.  Cardiovascular:     Rate and Rhythm: Normal rate and regular rhythm.     Pulses: Normal pulses.     Heart sounds: Normal heart sounds. No murmur heard.    No friction rub. No gallop.  Pulmonary:     Effort: Pulmonary effort is normal. No respiratory distress.     Breath sounds: Normal breath sounds.  Abdominal:     General: Bowel sounds are normal. There is no distension.     Palpations: Abdomen is soft. There is no mass.     Tenderness: There is no abdominal tenderness. There is no guarding.  Genitourinary:    Comments: No cva tenderness.  Musculoskeletal:        General: No swelling or tenderness.     Cervical back: Normal range of motion and neck supple. No rigidity or tenderness. No muscular tenderness.     Right lower leg: No edema.     Left lower leg: No edema.  Skin:    General: Skin is warm and dry.     Findings: No rash.  Neurological:     Mental Status: She is alert.     Comments: Alert, speech normal. Motor/sens grossly intact bil. Steady gait.   Psychiatric:        Mood and Affect: Mood normal.     ED Results / Procedures / Treatments   Labs (all labs ordered are listed, but only abnormal results are displayed) Results for orders placed or performed  during the hospital encounter of 48/18/56  Basic metabolic panel  Result Value Ref Range   Sodium 140 135 - 145 mmol/L   Potassium 4.8 3.5 - 5.1 mmol/L   Chloride 102 98 - 111 mmol/L   CO2 28 22 - 32 mmol/L   Glucose, Bld 97 70 - 99 mg/dL   BUN 26 (H) 8 - 23 mg/dL   Creatinine, Ser 1.38 (H) 0.44 - 1.00 mg/dL   Calcium 11.0 (H) 8.9 - 10.3 mg/dL   GFR, Estimated 38 (L) >60 mL/min  Anion gap 10 5 - 15  CBC  Result Value Ref Range   WBC 9.8 4.0 - 10.5 K/uL   RBC 4.58 3.87 - 5.11 MIL/uL   Hemoglobin 12.5 12.0 - 15.0 g/dL   HCT 39.6 36.0 - 46.0 %   MCV 86.5 80.0 - 100.0 fL   MCH 27.3 26.0 - 34.0 pg   MCHC 31.6 30.0 - 36.0 g/dL   RDW 15.3 11.5 - 15.5 %   Platelets 229 150 - 400 K/uL   nRBC 0.0 0.0 - 0.2 %  Urinalysis, Routine w reflex microscopic Urine, Clean Catch  Result Value Ref Range   Color, Urine YELLOW YELLOW   APPearance HAZY (A) CLEAR   Specific Gravity, Urine 1.025 1.005 - 1.030   pH 5.5 5.0 - 8.0   Glucose, UA NEGATIVE NEGATIVE mg/dL   Hgb urine dipstick TRACE (A) NEGATIVE   Bilirubin Urine NEGATIVE NEGATIVE   Ketones, ur NEGATIVE NEGATIVE mg/dL   Protein, ur NEGATIVE NEGATIVE mg/dL   Nitrite NEGATIVE NEGATIVE   Leukocytes,Ua LARGE (A) NEGATIVE  Urinalysis, Microscopic (reflex)  Result Value Ref Range   RBC / HPF 0-5 0 - 5 RBC/hpf   WBC, UA >50 0 - 5 WBC/hpf   Bacteria, UA MANY (A) NONE SEEN   Squamous Epithelial / LPF 21-50 0 - 5   WBC Clumps PRESENT      EKG EKG Interpretation  Date/Time:  Tuesday September 29 2021 16:13:17 EDT Ventricular Rate:  75 PR Interval:  206 QRS Duration: 76 QT Interval:  370 QTC Calculation: 413 R Axis:   -57 Text Interpretation: Sinus rhythm with Premature supraventricular complexes Left axis deviation Low voltage QRS Non-specific ST-t changes Confirmed by Lajean Saver [phone]) on 09/29/2021 8:18:10 PM  Radiology No results found.  Procedures Procedures    Medications Ordered in ED Medications  sodium chloride 0.9  % bolus 1,000 mL (1,000 mLs Intravenous New Bag/Given 09/29/21 2143)  cefTRIAXone (ROCEPHIN) 1 g in sodium chloride 0.9 % 100 mL IVPB (1 g Intravenous New Bag/Given 09/29/21 2142)    ED Course/ Medical Decision Making/ A&P                           Medical Decision Making Problems Addressed: Acute UTI: acute illness or injury with systemic symptoms that poses a threat to life or bodily functions Essential hypertension: chronic illness or injury with exacerbation, progression, or side effects of treatment that poses a threat to life or bodily functions Generalized weakness: acute illness or injury with systemic symptoms Insulin dependent type 2 diabetes mellitus ([place]): chronic illness or injury Stage 3b chronic kidney disease ([place]): chronic illness or injury  Amount and/or Complexity of Data Reviewed Independent Historian:     Details: Family, hx External Data Reviewed: labs and notes. Labs: ordered. Decision-making details documented in ED Course. ECG/medicine tests: ordered and independent interpretation performed. Decision-making details documented in ED Course.  Risk Prescription drug management. Decision regarding hospitalization.   Iv ns. Continuous pulse ox and cardiac monitoring. Labs ordered/sent.   Diff dx includes symptomatic anemia, aki, dehydration, uti, etc - dispo decision including potential need for admission considered - will get labs and imaging and reassess.   Reviewed nursing notes and prior charts for additional history. External reports reviewed. Additional history from: family.  Cardiac monitor: sinus rhythm, rate 66.   Labs reviewed/interpreted by me - wbc and hgb normal. Ua w many bact and many wbc c/w uti. Rocephin iv. Ns  bolus. Ca sl elev.   Overall, pt is well, not  toxic appearing. Tolerating po. Ambulates, no faintness.  Pt currently appears stable for d/c.   Rec pcp f/u.  Return precautions provided.            Final Clinical  Impression(s) / ED Diagnoses Final diagnoses:  Acute UTI  Generalized weakness  Essential hypertension  Insulin dependent type 2 diabetes mellitus ([place])  Stage 3b chronic kidney disease ([place])    Rx / DC Orders ED Discharge Orders          Ordered    cephALEXin (KEFLEX) 500 MG capsule  4 times daily        09/29/21 2237              Lajean Saver, MD 09/29/21 2239

## 2021-09-30 ENCOUNTER — Encounter (INDEPENDENT_AMBULATORY_CARE_PROVIDER_SITE_OTHER): Payer: Self-pay | Admitting: *Deleted

## 2021-10-13 ENCOUNTER — Other Ambulatory Visit: Payer: Self-pay

## 2021-10-13 ENCOUNTER — Emergency Department (HOSPITAL_COMMUNITY): Payer: Medicare PPO

## 2021-10-13 ENCOUNTER — Emergency Department (HOSPITAL_COMMUNITY)
Admission: EM | Admit: 2021-10-13 | Discharge: 2021-10-13 | Disposition: A | Discharge status: Home / Self Care | Payer: Medicare PPO | Attending: Emergency Medicine | Admitting: Emergency Medicine

## 2021-10-13 ENCOUNTER — Encounter (HOSPITAL_COMMUNITY): Payer: Self-pay | Admitting: *Deleted

## 2021-10-13 DIAGNOSIS — Z794 Long term (current) use of insulin: Secondary | ICD-10-CM | POA: Insufficient documentation

## 2021-10-13 DIAGNOSIS — R5383 Other fatigue: Secondary | ICD-10-CM | POA: Diagnosis not present

## 2021-10-13 DIAGNOSIS — R079 Chest pain, unspecified: Secondary | ICD-10-CM

## 2021-10-13 DIAGNOSIS — Z7984 Long term (current) use of oral hypoglycemic drugs: Secondary | ICD-10-CM | POA: Insufficient documentation

## 2021-10-13 DIAGNOSIS — R911 Solitary pulmonary nodule: Secondary | ICD-10-CM | POA: Insufficient documentation

## 2021-10-13 DIAGNOSIS — Z20822 Contact with and (suspected) exposure to covid-19: Secondary | ICD-10-CM | POA: Diagnosis not present

## 2021-10-13 DIAGNOSIS — R790 Abnormal level of blood mineral: Secondary | ICD-10-CM | POA: Diagnosis not present

## 2021-10-13 DIAGNOSIS — R072 Precordial pain: Secondary | ICD-10-CM | POA: Insufficient documentation

## 2021-10-13 DIAGNOSIS — Z7901 Long term (current) use of anticoagulants: Secondary | ICD-10-CM | POA: Insufficient documentation

## 2021-10-13 DIAGNOSIS — R0602 Shortness of breath: Secondary | ICD-10-CM | POA: Diagnosis not present

## 2021-10-13 LAB — CBC WITH DIFFERENTIAL/PLATELET
Abs Immature Granulocytes: 0.04 10*3/uL (ref 0.00–0.07)
Basophils Absolute: 0 10*3/uL (ref 0.0–0.1)
Basophils Relative: 0 %
Eosinophils Absolute: 0.4 10*3/uL (ref 0.0–0.5)
Eosinophils Relative: 4 %
HCT: 39.1 % (ref 36.0–46.0)
Hemoglobin: 12.3 g/dL (ref 12.0–15.0)
Immature Granulocytes: 0 %
Lymphocytes Relative: 14 %
Lymphs Abs: 1.4 10*3/uL (ref 0.7–4.0)
MCH: 27.2 pg (ref 26.0–34.0)
MCHC: 31.5 g/dL (ref 30.0–36.0)
MCV: 86.5 fL (ref 80.0–100.0)
Monocytes Absolute: 0.8 10*3/uL (ref 0.1–1.0)
Monocytes Relative: 8 %
Neutro Abs: 7.2 10*3/uL (ref 1.7–7.7)
Neutrophils Relative %: 74 %
Platelets: 221 10*3/uL (ref 150–400)
RBC: 4.52 MIL/uL (ref 3.87–5.11)
RDW: 15.4 % (ref 11.5–15.5)
WBC: 9.8 10*3/uL (ref 4.0–10.5)
nRBC: 0 % (ref 0.0–0.2)

## 2021-10-13 LAB — BASIC METABOLIC PANEL
Anion gap: 11 (ref 5–15)
BUN: 22 mg/dL (ref 8–23)
CO2: 28 mmol/L (ref 22–32)
Calcium: 10.5 mg/dL — ABNORMAL HIGH (ref 8.9–10.3)
Chloride: 99 mmol/L (ref 98–111)
Creatinine, Ser: 1.51 mg/dL — ABNORMAL HIGH (ref 0.44–1.00)
GFR, Estimated: 34 mL/min — ABNORMAL LOW (ref >60)
Glucose, Bld: 104 mg/dL — ABNORMAL HIGH (ref 70–99)
Potassium: 4.5 mmol/L (ref 3.5–5.1)
Sodium: 138 mmol/L (ref 135–145)

## 2021-10-13 LAB — TROPONIN I (HIGH SENSITIVITY)
Troponin I (High Sensitivity): 12 ng/L (ref <18)
Troponin I (High Sensitivity): 13 ng/L (ref <18)

## 2021-10-13 LAB — SARS CORONAVIRUS 2 BY RT PCR: SARS Coronavirus 2 by RT PCR: NEGATIVE

## 2021-10-13 LAB — BRAIN NATRIURETIC PEPTIDE: B Natriuretic Peptide: 267 pg/mL — ABNORMAL HIGH (ref 0.0–100.0)

## 2021-10-13 MED ORDER — FUROSEMIDE 10 MG/ML IJ SOLN
40.0000 mg | Freq: Once | INTRAMUSCULAR | Status: AC
  Administered 2021-10-13: 40 mg via INTRAVENOUS
  Filled 2021-10-13: qty 4

## 2021-10-13 NOTE — Discharge Instructions (Signed)
You were seen in the emergency department for chest pain shortness of breath.  You had blood work EKG and chest x-ray that did not show an obvious explanation for your symptoms.  You possibly had some extra fluid and so received an IV dose of Lasix.  Please see your cardiologist tomorrow for further evaluation.  Return to the emergency department if any worsening or concerning symptoms.

## 2021-10-13 NOTE — ED Triage Notes (Signed)
Pt sent here for CP and SOB at office, [facility] reported pt denies CP or SOB with them.  Pt in triage with mild SOB.  Denies any CP at present.

## 2021-10-13 NOTE — ED Notes (Signed)
Pt in bed, family at bedside, states that she is ready to go home, verbalized understanding d/c and follow up, pt from dpt.

## 2021-10-13 NOTE — ED Provider Triage Note (Signed)
Emergency Medicine Provider Triage Evaluation Note  Stephanie [name] , a 86 y.o. female  was evaluated in triage.  Pt complains of chest pain since yesterday left arm pain for a month nasal congestion.  Review of Systems  Positive: Chest pain and shortness of breath Negative: Abdominal pain  Physical Exam  BP (!) 196/81 (BP Location: Left Arm)   Pulse 66   Temp (!) 96.8 F (36 C) (Temporal)   Resp 20   Ht 5\' 5"  (1.651 m)   Wt 102.1 kg   SpO2 100%   BMI 37.44 kg/m  Gen:   Awake, no distress   Resp:  Normal effort  MSK:   Moves extremities without difficulty  Other:    Medical Decision Making  Medically screening exam initiated at 5:31 PM.  Appropriate orders placed.  Stephanie [name] was informed that the remainder of the evaluation will be completed by another provider, this initial triage assessment does not replace that evaluation, and the importance of remaining in the ED until their evaluation is complete.     Hayden Rasmussen, MD 10/13/21 [phone]

## 2021-10-13 NOTE — ED Notes (Signed)
Ambulatory O2 sat, pt uses a walker, O2 sat remains around 96% on room air.

## 2021-10-13 NOTE — ED Provider Notes (Signed)
[facility] EMERGENCY DEPARTMENT Provider Note   CSN: [id number] Arrival date & time: 10/13/21  1349     History {Add pertinent medical, surgical, social history, OB history to HPI:1} Chief Complaint  Patient presents with   Chest Pain    Stephanie [name] is a 86 y.o. female.  Patient complaining of on and off chest pain that started last evening.  She is also felt more short of breath than baseline and has been having some shortness of breath when she lays down in bed.  Feels her nose is clogging up.  She is also had more longstanding left arm discomfort that is been going on for about a month.  She went to her primary care doctor today who put her on oxygen and sent her over here.  She said she had a Aortic valve replacement about a year ago and is supposed to see cardiology tomorrow.  The history is provided by the patient.  Chest Pain Pain location:  Substernal area Pain quality: aching   Pain radiates to:  Does not radiate Pain severity:  Moderate Onset quality:  Gradual Duration:  2 days Timing:  Intermittent Progression:  Unchanged Chronicity:  New Relieved by:  None tried Worsened by:  Nothing Ineffective treatments:  None tried Associated symptoms: fatigue and shortness of breath   Associated symptoms: no abdominal pain, no cough, no diaphoresis, no nausea and no vomiting        Home Medications Prior to Admission medications   Medication Sig Start Date End Date Taking? Authorizing Provider  albuterol (PROVENTIL) (2.5 MG/3ML) 0.083% nebulizer solution Take 2.5 mg by nebulization every 6 (six) hours as needed for wheezing or shortness of breath.  10/20/17   [provider]  allopurinol (ZYLOPRIM) 300 MG tablet Take 300 mg by mouth daily. 06/24/20   [provider]  apixaban (ELIQUIS) 5 MG TABS tablet Take 1 tablet (5 mg total) by mouth 2 (two) times daily. Patient not taking: Reported on 09/29/2021 07/03/21 09/01/21  Heath Lark D, DO  carvedilol  (COREG) 6.25 MG tablet Take 1 tablet (6.25 mg total) by mouth 2 (two) times daily. 05/06/21   Imogene Burn, PA-C  cephALEXin (KEFLEX) 500 MG capsule Take 1 capsule (500 mg total) by mouth 4 (four) times daily. 09/29/21   Lajean Saver, MD  diclofenac Sodium (VOLTAREN) 1 % GEL Apply 2 g topically 4 (four) times daily as needed (pain). 05/20/21   [provider]  ferrous sulfate 325 (65 FE) MG EC tablet Take 325 mg by mouth daily with breakfast.    [provider]  fluticasone (FLONASE) 50 MCG/ACT nasal spray Place 2 sprays into both nostrils 2 (two) times daily. 08/20/21   [provider]  furosemide (LASIX) 40 MG tablet TAKE 1 TABLET BY MOUTH ONCE DAILY. 03/10/21   Satira Sark, MD  gabapentin (NEURONTIN) 100 MG capsule Take 100-200 mg by mouth at bedtime. 10/02/19   [provider]  GNP MUCUS RELIEF 400 MG TABS tablet Take 400 mg by mouth every 4 (four) hours as needed. 08/27/21   [provider]  HYDROcodone-acetaminophen (NORCO/VICODIN) 5-325 MG tablet TAKE 1 TABLET BY MOUTH ONCE DAILY AS NEEDED FOR PAIN once a day for 30 days 08/24/21   [provider]  Insulin Detemir (LEVEMIR FLEXTOUCH) 100 UNIT/ML Pen Inject 30 Units into the skin at bedtime. 02/10/19   Johnson, Clanford L, MD  metFORMIN (GLUCOPHAGE) 500 MG tablet Take 1 tablet (500 mg total) by mouth 2 (two)  times daily. 07/02/20   Eileen Stanford, PA-C  traMADol (ULTRAM) 50 MG tablet Take 50 mg by mouth daily as needed for moderate pain. 05/20/21   [provider]      Allergies    Amoxicillin, Penicillins, Sulfa antibiotics, and Nitrofurantoin    Review of Systems   Review of Systems  Constitutional:  Positive for fatigue. Negative for diaphoresis.  Eyes:  Negative for visual disturbance.  Respiratory:  Positive for shortness of breath. Negative for cough.   Cardiovascular:  Positive for chest pain.  Gastrointestinal:  Negative for abdominal pain, nausea and vomiting.     Physical Exam Updated Vital Signs BP (!) 196/81 (BP Location: Left Arm)   Pulse 66   Temp (!) 96.8 F (36 C) (Temporal)   Resp 20   Ht 5\' 5"  (1.651 m)   Wt 102.1 kg   SpO2 100%   BMI 37.44 kg/m  Physical Exam Vitals and nursing note reviewed.  Constitutional:      General: She is not in acute distress.    Appearance: She is well-developed.  HENT:     Head: Normocephalic and atraumatic.  Eyes:     Conjunctiva/sclera: Conjunctivae normal.  Cardiovascular:     Rate and Rhythm: Normal rate and regular rhythm.     Heart sounds: Normal heart sounds. No murmur heard. Pulmonary:     Effort: Pulmonary effort is normal. No respiratory distress.     Breath sounds: Normal breath sounds.  Abdominal:     Palpations: Abdomen is soft.     Tenderness: There is no abdominal tenderness. There is no guarding or rebound.  Musculoskeletal:        General: No swelling. Normal range of motion.     Cervical back: Neck supple.     Right lower leg: No tenderness.     Left lower leg: No tenderness.  Skin:    General: Skin is warm and dry.     Capillary Refill: Capillary refill takes less than 2 seconds.  Neurological:     General: No focal deficit present.     Mental Status: She is alert.     ED Results / Procedures / Treatments   Labs (all labs ordered are listed, but only abnormal results are displayed) Labs Reviewed  BASIC METABOLIC PANEL - Abnormal; Notable for the following components:      Result Value   Glucose, Bld 104 (*)    Creatinine, Ser 1.51 (*)    Calcium 10.5 (*)    GFR, Estimated 34 (*)    All other components within normal limits  BRAIN NATRIURETIC PEPTIDE - Abnormal; Notable for the following components:   B Natriuretic Peptide 267.0 (*)    All other components within normal limits  SARS CORONAVIRUS 2 BY RT PCR  CBC WITH DIFFERENTIAL/PLATELET  TROPONIN I (HIGH SENSITIVITY)  TROPONIN I (HIGH SENSITIVITY)    EKG EKG Interpretation  Date/Time:  Tuesday  October 13 2021 14:18:23 EDT Ventricular Rate:  63 PR Interval:  202 QRS Duration: 80 QT Interval:  400 QTC Calculation: 409 R Axis:   -58 Text Interpretation: Sinus rhythm with Premature atrial complexes Left anterior fascicular block Minimal voltage criteria for LVH, may be normal variant ( R in aVL ) Septal infarct (cited on or before 13-Oct-2021) Abnormal ECG When compared with ECG of 29-Sep-2021 16:13, Questionable change in initial forces of Septal leads Confirmed by Aletta Edouard [phone]) on 10/13/2021 3:12:13 PM  Radiology DG Chest 2 View  Result Date: 10/13/2021  CLINICAL DATA:  Shortness of breath and chest pain since yesterday EXAM: CHEST - 2 VIEW COMPARISON:  Portable exam 1735 hours compared to 07/03/2021 FINDINGS: Enlargement of cardiac silhouette post TAVR. Mediastinal contours and pulmonary vascularity normal. Atherosclerotic calcification aorta. Question vague nodular density lower RIGHT chest. Lungs otherwise clear. No acute infiltrate, pleural effusion, or pneumothorax. Bones demineralized with degenerative changes thoracic spine, prior lumbar fusion, and advanced degenerative changes of the LEFT glenohumeral joint. IMPRESSION: Enlargement of cardiac silhouette without acute infiltrate. Question nodular density lower RIGHT lung; CT chest recommended to exclude pulmonary nodule. Aortic Atherosclerosis (ICD10-I70.0). Electronically Signed   By: Lavonia Dana M.D.   On: 10/13/2021 17:58    Procedures Procedures  {Document cardiac monitor, telemetry assessment procedure when appropriate:1}  Medications Ordered in ED Medications - No data to display  ED Course/ Medical Decision Making/ A&P                           Medical Decision Making Amount and/or Complexity of Data Reviewed Labs: ordered. Radiology: ordered.   This patient complains of ***; this involves an extensive number of treatment Options and is a complaint that carries with it a high risk of complications  and morbidity. The differential includes ***  I ordered, reviewed and interpreted labs, which included *** I ordered medication *** and reviewed PMP when indicated. I ordered imaging studies which included *** and I independently    visualized and interpreted imaging which showed *** Additional history obtained from *** Previous records obtained and reviewed *** I consulted *** and discussed lab and imaging findings and discussed disposition.  Cardiac monitoring reviewed, *** Social determinants considered, *** Critical Interventions: ***  After the interventions stated above, I reevaluated the patient and found *** Admission and further testing considered, ***   {Document critical care time when appropriate:1} {Document review of labs and clinical decision tools ie heart score, Chads2Vasc2 etc:1}  {Document your independent review of radiology images, and any outside records:1} {Document your discussion with family members, caretakers, and with consultants:1} {Document social determinants of health affecting pt's care:1} {Document your decision making why or why not admission, treatments were needed:1} Final Clinical Impression(s) / ED Diagnoses Final diagnoses:  None    Rx / DC Orders ED Discharge Orders     None

## 2021-10-14 ENCOUNTER — Ambulatory Visit: Payer: Medicare PPO | Attending: Cardiology | Admitting: Cardiology

## 2021-10-14 ENCOUNTER — Encounter: Payer: Self-pay | Admitting: Cardiology

## 2021-10-14 VITALS — BP 140/62 | HR 68 | Ht 65.0 in | Wt 183.4 lb

## 2021-10-14 DIAGNOSIS — R0789 Other chest pain: Secondary | ICD-10-CM

## 2021-10-14 DIAGNOSIS — R911 Solitary pulmonary nodule: Secondary | ICD-10-CM | POA: Diagnosis not present

## 2021-10-14 DIAGNOSIS — Z952 Presence of prosthetic heart valve: Secondary | ICD-10-CM | POA: Diagnosis not present

## 2021-10-14 NOTE — Patient Instructions (Signed)
Medication Instructions:  Your physician recommends that you continue on your current medications as directed. Please refer to the Current Medication list given to you today.   Labwork: None today  Testing/Procedures: Non Contrast chest CT  Follow-Up: 6 months  Any Other Special Instructions Will Be Listed Below (If Applicable).  If you need a refill on your cardiac medications before your next appointment, please call your pharmacy.

## 2021-10-14 NOTE — Progress Notes (Signed)
Cardiology Office Note  Date: 10/14/2021   ID: Stephanie [name], DOB June 21, 1935, MRN [id number]  PCP:  [facility]  Cardiologist:  Rozann Lesches, MD Electrophysiologist:  None   Chief Complaint  Patient presents with   Cardiac follow-up    History of Present Illness: Stephanie [name] is an 86 y.o. female last seen in July by Ms. McDaniel NP in the multidisciplinary heart valve clinic, I reviewed the note.  Chart review finds that she was just seen in the [facility] yesterday for evaluation of chest pain.  I reviewed her ECG as noted below.  No obvious acute process was identified.  High-sensitivity troponin I levels were normal, BNP 267 which is stable.  Chest x-ray reported no acute infiltrate, question nodular density lower right lung with recommendation for chest CT.  She is here today with family member.  Tells me that she experienced a "sharp" left-sided chest discomfort that prompted her evaluation in the ER, this spontaneously resolved and she feels at baseline today.  No cough or hemoptysis.  Follow-up echocardiogram in June revealed LVEF 50 to 96%, mild diastolic dysfunction, RVSP estimated 38 mmHg, moderate biatrial enlargement, and stable TAVR with mean gradient 8.3 mmHg and no significant paravalvular regurgitation.  Of note, cardiac catheterization in March of last year prior to TAVR showed nonobstructive CAD.  I reviewed her medications which are noted below.  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 Burt Knack and Dr. Cyndia Bent.   Severe aortic stenosis    Symptomatic anemia    Presumed GI bleed January 2021 - she declined GI work-up   Type 2 diabetes mellitus ([place])      Past Surgical History:  Procedure Laterality Date   APPENDECTOMY     BACK SURGERY     CYST REMOVAL HAND     Right, hospital in [place] ECHOCARDIOGRAM Left 07/01/2020   Procedure: INTRAOPERATIVE TRANSTHORACIC ECHOCARDIOGRAM;  Surgeon: Sherren Mocha, MD;  Location: [facility];  Service: Open Heart Surgery;  Laterality: Left;   KNEE ARTHROSCOPY WITH MEDIAL MENISECTOMY Right 02/28/2012   Procedure: KNEE ARTHROSCOPY WITH MEDIAL MENISECTOMY;  Surgeon: Carole Civil, MD;  Location: [facility];  Service: Orthopedics;  Laterality: Right;   REPAIR KNEE LIGAMENT     RIGHT/LEFT HEART CATH AND CORONARY ANGIOGRAPHY N/A 04/10/2020   Procedure: RIGHT/LEFT HEART CATH AND CORONARY ANGIOGRAPHY;  Surgeon: Jettie Booze, MD;  Location: [facility];  Service: Cardiovascular;  Laterality: N/A;   TRANSCATHETER AORTIC VALVE REPLACEMENT, TRANSFEMORAL N/A 07/01/2020   Procedure: TRANSCATHETER AORTIC VALVE REPLACEMENT, TRANSFEMORAL;  Surgeon: Sherren Mocha, MD;  Location: [facility];  Service: Open Heart Surgery;  Laterality: N/A;   ULTRASOUND GUIDANCE FOR VASCULAR ACCESS Bilateral 07/01/2020   Procedure: ULTRASOUND GUIDANCE FOR VASCULAR ACCESS;  Surgeon: Sherren Mocha, MD;  Location: [facility];  Service: Open Heart Surgery;  Laterality: Bilateral;    Current Outpatient Medications  Medication Sig Dispense Refill   albuterol (PROVENTIL) (2.5 MG/3ML) 0.083% nebulizer solution Take 2.5 mg by nebulization every 6 (six) hours as needed for wheezing or shortness of breath.   2   allopurinol (ZYLOPRIM) 300 MG tablet  Take 300 mg by mouth daily.     apixaban (ELIQUIS) 5 MG TABS tablet Take 1 tablet (5 mg total) by mouth 2 (two) times daily. 120 tablet 0   carvedilol (COREG) 6.25 MG tablet Take 1 tablet (6.25 mg total) by mouth 2 (two) times daily. 180 tablet 3   cephALEXin (KEFLEX) 500 MG capsule Take 1 capsule (500 mg total) by mouth 4 (four) times daily. 20 capsule 0   diclofenac Sodium  (VOLTAREN) 1 % GEL Apply 2 g topically 4 (four) times daily as needed (pain).     ferrous sulfate 325 (65 FE) MG EC tablet Take 325 mg by mouth daily with breakfast.     fluticasone (FLONASE) 50 MCG/ACT nasal spray Place 2 sprays into both nostrils 2 (two) times daily.     gabapentin (NEURONTIN) 100 MG capsule Take 100-200 mg by mouth at bedtime.     GNP MUCUS RELIEF 400 MG TABS tablet Take 400 mg by mouth every 4 (four) hours as needed.     HYDROcodone-acetaminophen (NORCO/VICODIN) 5-325 MG tablet Take 1 tablet by mouth daily as needed for moderate pain.     Insulin Detemir (LEVEMIR FLEXTOUCH) 100 UNIT/ML Pen Inject 30 Units into the skin at bedtime. 15 mL 11   metFORMIN (GLUCOPHAGE) 500 MG tablet Take 1 tablet (500 mg total) by mouth 2 (two) times daily.     traMADol (ULTRAM) 50 MG tablet Take 50 mg by mouth daily as needed for moderate pain.     No current facility-administered medications for this visit.   Allergies:  Amoxicillin, Penicillins, Sulfa antibiotics, and Nitrofurantoin   ROS: In wheelchair today.  No orthopnea or PND.  Physical Exam: VS:  BP (!) 140/62   Pulse 68   Ht 5\' 5"  (1.651 m)   Wt 183 lb 6.4 oz (83.2 kg)   SpO2 95%   BMI 30.52 kg/m , BMI Body mass index is 30.52 kg/m.  Wt Readings from Last 3 Encounters:  10/14/21 183 lb 6.4 oz (83.2 kg)  10/13/21 225 lb (102.1 kg)  09/29/21 210 lb (95.3 kg)    General: Patient appears comfortable at rest. HEENT: Conjunctiva and lids normal. Neck: Supple, no elevated JVP or carotid bruits, no thyromegaly. Lungs: Clear to auscultation, nonlabored breathing at rest. Cardiac: Regular rate and rhythm, no S3, 2/6 systolic murmur, no pericardial rub. Abdomen: Soft, bowel sounds present. Extremities: No pitting edema, distal pulses 2+.  ECG:  An ECG dated 10/13/2021 was personally reviewed today and demonstrated:  Sinus rhythm with PAC, left anterior fascicular block, borderline increased voltage and poor R wave  progression.  Recent Labwork: 07/02/2021: TSH 3.200 07/03/2021: ALT 9; AST 13 07/09/2021: Magnesium 1.8 10/13/2021: B Natriuretic Peptide 267.0; BUN 22; Creatinine, Ser 1.51; Hemoglobin 12.3; Platelets 221; Potassium 4.5; Sodium 138     Component Value Date/Time   CHOL 184 04/11/2020 0945   TRIG 283 (H) 04/11/2020 0945   HDL 46 04/11/2020 0945   CHOLHDL 4.0 04/11/2020 0945   VLDL 57 (H) 04/11/2020 0945   LDLCALC 81 04/11/2020 0945    Other Studies Reviewed Today:  Cardiac catheterization 04/10/2020: Mid Cx lesion is 50% stenosed. LV end diastolic pressure is mildly elevated. There is mild aortic valve stenosis. Calculated valve area 1.56 cm2. Ao sat 98%, PA sat 73%, PA pressure 39/5, mean PA pressure 19 mm Hg; mean PCWP 14 mm Hg; CO 6.5 L/min; CI 3.3. Small radial artery. Ulnar access obtained. Vasospasm noted.   Nonobstructive CAD.  Mild aortic stenosis.  Echocardiogram 07/02/2021:  1. Left ventricular ejection fraction, by estimation, is 50 to 55%. Left  ventricular ejection fraction by 2D MOD biplane is 51.9 %. The left  ventricle has low normal function. The left ventricle has no regional wall  motion abnormalities. There is  moderate left ventricular hypertrophy. Left ventricular diastolic  parameters are consistent with Grade I diastolic dysfunction (impaired  relaxation).   2. Right ventricular systolic function is normal. The right ventricular  size is normal. There is mildly elevated pulmonary artery systolic  pressure. The estimated right ventricular systolic pressure is 16.9 mmHg.   3. Left atrial size was moderately dilated.   4. Right atrial size was moderately dilated.   5. The mitral valve is abnormal. Trivial mitral valve regurgitation.   6. The aortic valve has been repaired/replaced. Aortic valve  regurgitation is not visualized. There is a 26 mm Sapien prosthetic (TAVR)  valve present in the aortic position. Procedure Date: 07/01/2020. Echo  findings are  consistent with normal structure  and function of the aortic valve prosthesis. Aortic valve area, by VTI  measures 1.83 cm. Aortic valve mean gradient measures 8.3 mmHg. Aortic  valve Vmax measures 2.10 m/s.   Chest x-ray 10/13/2021: IMPRESSION: Enlargement of cardiac silhouette without acute infiltrate.   Question nodular density lower RIGHT lung; CT chest recommended to exclude pulmonary nodule.   Aortic Atherosclerosis (ICD10-I70.0).  Assessment and Plan:  1.  History of severe aortic stenosis status post TAVR in June 2022.  She is clinically stable, recent echocardiogram in June revealed LVEF 50 to 55% with stable TAVR and mean gradient 8 mmHg.  No paravalvular regurgitation.  She is no longer on antiplatelet therapy.  2.  Paroxysmal atrial fibrillation with CHA2DS2-VASc score of 6.  She is tolerating Eliquis for stroke prophylaxis, no spontaneous bleeding problems.  Recent lab work reviewed.  3.  Recent atypical chest discomfort, cardiac etiology not suspected based on description.  She had nonobstructive CAD by cardiac catheterization last year and recent high-sensitivity troponin I levels were normal.  I reviewed her ECG.  There was question of nodular density in the right lower lung by chest x-ray done in the ER yesterday, not clear that this was causing symptoms, but needs to be further evaluated by chest CT which will be arranged.  Medication Adjustments/Labs and Tests Ordered: Current medicines are reviewed at length with the patient today.  Concerns regarding medicines are outlined above.   Tests Ordered: Orders Placed This Encounter  Procedures   CT Chest Wo Contrast    Medication Changes: No orders of the defined types were placed in this encounter.   Disposition:  Follow up  6 months.  Signed, Satira Sark, MD, [facility] 10/14/2021 11:00 AM    [facility] at [facility] [address] [place], [place] [address] Phone: [phone]; Fax:  [phone]

## 2021-10-30 ENCOUNTER — Ambulatory Visit (HOSPITAL_COMMUNITY)
Admission: RE | Admit: 2021-10-30 | Discharge: 2021-10-30 | Disposition: A | Discharge status: Home / Self Care | Payer: Medicare PPO | Source: Ambulatory Visit | Attending: Cardiology | Admitting: Cardiology

## 2021-10-30 DIAGNOSIS — R911 Solitary pulmonary nodule: Secondary | ICD-10-CM | POA: Diagnosis present

## 2021-11-10 ENCOUNTER — Telehealth: Payer: Self-pay

## 2021-11-10 DIAGNOSIS — R911 Solitary pulmonary nodule: Secondary | ICD-10-CM

## 2021-11-10 NOTE — Telephone Encounter (Signed)
-----   Message from Merlene Laughter, RN sent at 11/02/2021 11:25 AM EDT -----  ----- Message ----- From: Satira Sark, MD Sent: 11/02/2021   8:15 AM EDT To: Merlene Laughter, RN  Results reviewed.  Please let her know that the follow-up chest CT does show a lung nodule on the right that is concerning for neoplasm, further testing would be necessary to exclude a type of cancer.  Let her know that this finding is not related to her heart valve or cardiovascular status.  She is followed by [facility].  Suggest referral to [facility] for further evaluation if she is in agreement.

## 2021-11-10 NOTE — Telephone Encounter (Signed)
I was able to reach Stephanie [name]. I discussed Dr.McDowell's message with her regarding lung nodule found on CT. She agrees to consult with pulmonary.Referral placed,copied pcp

## 2021-11-13 ENCOUNTER — Ambulatory Visit (INDEPENDENT_AMBULATORY_CARE_PROVIDER_SITE_OTHER): Payer: Medicare PPO | Admitting: Internal Medicine

## 2021-11-13 ENCOUNTER — Encounter: Payer: Self-pay | Admitting: Internal Medicine

## 2021-11-13 DIAGNOSIS — R911 Solitary pulmonary nodule: Secondary | ICD-10-CM

## 2021-11-13 LAB — POCT EXHALED NITRIC OXIDE: FeNO level (ppb): 30

## 2021-11-13 NOTE — Patient Instructions (Addendum)
My office will be contacting you by phone for referral to [facility] office for PFTs and same day visit with Dr Windell Norfolk or Lamonte Sakai   - if you don't hear back from my office within one week please call us back or notify us thru MyChart and we'll address it right away.   Follow up here is as needed in [facility] clinic

## 2021-11-13 NOTE — Assessment & Plan Note (Signed)
Quit smoking around 2000 - CTa 03/05/21 1.6 x 0.8 cm mixed semi-solid nodule with irregular and ground-glass margins located in the anterior aspect of the right lower lobe no change since 11/19/20 so rec f/u in one year >  Placed in reminder file  - CT  10/30/21  1.7 x 1.0 cm irregular ill-defined nodule in the anterior right lower lobe, contiguous with the fissure has increased minimally in size since the prior study but qualitatively appears more confluent today. Imaging features are highly suspicious for neoplasm  PET scan may not help here in a very slow growing sub-solid tumor so Options are FOB  bx/ SBRT  Vs excisional bx depending on PFTs and pt wishes > will send to [facility] for PFTs and eval by proceduralist hopefull on same day and T surgery consultation also as a later option.   Discussed in detail all the  indications, usual  risks and alternatives  relative to the benefits with patient who agrees to proceed with w/u as outlined.     F/u in this clinic is prn          Each maintenance medication was reviewed in detail including emphasizing most importantly the difference between maintenance and prns and under what circumstances the prns are to be triggered using an action plan format where appropriate.  Total time for H and P, chart review, counseling,   and generating customized AVS unique to this office visit / same day charting > 30 min summary final pulmonary ov

## 2021-11-13 NOTE — Progress Notes (Signed)
Stephanie [name], female    DOB: November 06, 1935,   MRN: [id number]   Brief patient profile:  32   yobf  quit smoking 2000 with doe then  referred to pulmonary clinic in [place]  03/31/2021 by Stephanie [name] for spn     History of Present Illness  03/31/2021  Pulmonary/ 1st [name] eval/ Stephanie [name] / Stephanie [name]  Chief Complaint  Patient presents with   Consult    CT done in Feb 2023 showed pulmonary nodules.   Dyspnea:  can still do [employer] but usually uses scooter due to back  Also [place] slow pace s doe  Cough: none  Sleep: 2 pillows bed is flat SABA use: neb not even once a month  Rec We will schedule you for PFTs > not done as of 11/13/2021  also contact you in the future for CT @ 1 years from the last.  Call sooner for worse breathing or chest pain when you breathe in or cough that won't go away after a few weeks.   11/13/2021  f/u ov/Stephanie [name] re: Nodule f/u maint on no resp rx   Chief Complaint  Patient presents with   Follow-up    Lung nodule seen on CT   Dyspnea: rides scooter for years due to back / walks next door to sister with rollator  Cough: none  Sleeping: 2 pillows/ flat bed  SABA use: has neb, not using  02: none  Covid status: vax / never infected    No obvious day to day or daytime variability or assoc excess/ purulent sputum or mucus plugs or hemoptysis or cp or chest tightness, subjective wheeze or overt sinus or hb symptoms.   Sleeping  without nocturnal  or early am exacerbation  of respiratory  c/o's or need for noct saba. Also denies any obvious fluctuation of symptoms with weather or environmental changes or other aggravating or alleviating factors except as outlined above   No unusual exposure hx or h/o childhood pna/ asthma or knowledge of premature birth.  Current Allergies, Complete Past Medical History, Past Surgical History, Family History, and Social History were reviewed in [employer]  record.  ROS  The following are not active complaints unless bolded Hoarseness, sore throat, dysphagia, dental problems, itching, sneezing,  nasal congestion or discharge of excess mucus or purulent secretions, ear ache,   fever, chills, sweats, unintended wt loss or wt gain, classically pleuritic or exertional cp,  orthopnea pnd or arm/hand swelling  or leg swelling, presyncope, palpitations, abdominal pain, anorexia, nausea, vomiting, diarrhea  or change in bowel habits or change in bladder habits, change in stools or change in urine, dysuria, hematuria,  rash, arthralgias, visual complaints, headache, numbness, weakness or ataxia or problems with walking or coordination,  change in mood or  memory.        Current Meds  Medication Sig   albuterol (PROVENTIL) (2.5 MG/3ML) 0.083% nebulizer solution Take 2.5 mg by nebulization every 6 (six) hours as needed for wheezing or shortness of breath.    allopurinol (ZYLOPRIM) 300 MG tablet Take 300 mg by mouth daily.   carvedilol (COREG) 6.25 MG tablet Take 1 tablet (6.25 mg total) by mouth 2 (two) times daily.   cephALEXin (KEFLEX) 500 MG capsule Take 1 capsule (500 mg total) by mouth 4 (four) times daily.   diclofenac Sodium (VOLTAREN) 1 % GEL Apply 2 g topically 4 (four) times daily as needed (pain).   ferrous sulfate 325 (65  FE) MG EC tablet Take 325 mg by mouth daily with breakfast.   fluticasone (FLONASE) 50 MCG/ACT nasal spray Place 2 sprays into both nostrils 2 (two) times daily.   gabapentin (NEURONTIN) 100 MG capsule Take 100-200 mg by mouth at bedtime.   GNP MUCUS RELIEF 400 MG TABS tablet Take 400 mg by mouth every 4 (four) hours as needed.   HYDROcodone-acetaminophen (NORCO/VICODIN) 5-325 MG tablet Take 1 tablet by mouth daily as needed for moderate pain.   Insulin Detemir (LEVEMIR FLEXTOUCH) 100 UNIT/ML Pen Inject 30 Units into the skin at bedtime.   metFORMIN (GLUCOPHAGE) 500 MG tablet Take 1 tablet (500 mg total) by mouth 2 (two) times  daily.   traMADol (ULTRAM) 50 MG tablet Take 50 mg by mouth daily as needed for moderate pain.                   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 Burt [name] and Dr. Cyndia [name].   Severe aortic stenosis    Symptomatic anemia    Presumed GI bleed January 2021 - she declined GI work-up   Type 2 diabetes mellitus ([place])         Objective:     Wt Readings from Last 3 Encounters:  11/13/21 181 lb 3.2 oz (82.2 kg)  10/14/21 183 lb 6.4 oz (83.2 kg)  10/13/21 225 lb (102.1 kg)     Vital signs reviewed  11/13/2021  - Note at rest 02 sats  96% on RA   General appearance:    w/c bound elderly bf nad     HEENT : Oropharynx  clear      Nasal turbinates nl    NECK :  without  apparent JVD/ palpable Nodes/TM    LUNGS: no acc muscle use,  Nl contour chest which is clear to A and P bilaterally without cough on insp or exp maneuvers   CV:  RRR  no s3 or murmur or increase in P2, and no edema   ABD:  soft and nontender with nl inspiratory excursion in the supine position. No bruits or organomegaly appreciated   MS:  Nl gait/ ext warm without deformities Or obvious joint restrictions  calf tenderness, cyanosis or clubbing    SKIN: warm and dry without lesions    NEURO:  alert, approp, nl sensorium with  no motor or cerebellar deficits apparent.             Assessment

## 2021-11-28 ENCOUNTER — Other Ambulatory Visit: Payer: Self-pay

## 2021-11-28 ENCOUNTER — Emergency Department (HOSPITAL_COMMUNITY)
Admission: EM | Admit: 2021-11-28 | Discharge: 2021-11-28 | Disposition: A | Discharge status: Home / Self Care | Payer: Medicare PPO | Attending: Emergency Medicine | Admitting: Emergency Medicine

## 2021-11-28 ENCOUNTER — Encounter (HOSPITAL_COMMUNITY): Payer: Self-pay | Admitting: Emergency Medicine

## 2021-11-28 DIAGNOSIS — J45909 Unspecified asthma, uncomplicated: Secondary | ICD-10-CM | POA: Insufficient documentation

## 2021-11-28 DIAGNOSIS — Z7984 Long term (current) use of oral hypoglycemic drugs: Secondary | ICD-10-CM | POA: Insufficient documentation

## 2021-11-28 DIAGNOSIS — E1122 Type 2 diabetes mellitus with diabetic chronic kidney disease: Secondary | ICD-10-CM | POA: Diagnosis not present

## 2021-11-28 DIAGNOSIS — H6993 Unspecified Eustachian tube disorder, bilateral: Secondary | ICD-10-CM

## 2021-11-28 DIAGNOSIS — I129 Hypertensive chronic kidney disease with stage 1 through stage 4 chronic kidney disease, or unspecified chronic kidney disease: Secondary | ICD-10-CM | POA: Insufficient documentation

## 2021-11-28 DIAGNOSIS — Z7901 Long term (current) use of anticoagulants: Secondary | ICD-10-CM | POA: Diagnosis not present

## 2021-11-28 DIAGNOSIS — N189 Chronic kidney disease, unspecified: Secondary | ICD-10-CM | POA: Insufficient documentation

## 2021-11-28 DIAGNOSIS — Z79899 Other long term (current) drug therapy: Secondary | ICD-10-CM | POA: Insufficient documentation

## 2021-11-28 DIAGNOSIS — H938X9 Other specified disorders of ear, unspecified ear: Secondary | ICD-10-CM | POA: Diagnosis present

## 2021-11-28 NOTE — ED Triage Notes (Signed)
Patient c/o bilateral ear pain. Per patient started yesterday in left ear but now has pain in both. Denies any drainage or fevers. Per patient hears "whooshing noise when she movies her head."

## 2021-11-28 NOTE — Discharge Instructions (Signed)
You have been seen today for your complaint of hearing problems. Your discharge medications include Claritin, Allegra, or Zyrtec.  You may take any of these.  These are over-the-counter medications used to treat allergies.  You should follow the dosing instructions on the box. Flonase.  This is an intranasal steroid spray.  You should spray 1 spray in each nostril daily until symptoms resolve. Follow up with: Your primary care provider in 1 week Please seek immediate medical care if you develop any of the following symptoms: You have a sudden, severe increase in any of your symptoms. At this time there does not appear to be the presence of an emergent medical condition, however there is always the potential for conditions to change. Please read and follow the below instructions.  Do not take your medicine if  develop an itchy rash, swelling in your mouth or lips, or difficulty breathing; call 911 and seek immediate emergency medical attention if this occurs.  You may review your lab tests and imaging results in their entirety on your MyChart account.  Please discuss all results of fully with your primary care provider and other specialist at your follow-up visit.  Note: Portions of this text may have been transcribed using voice recognition software. Every effort was made to ensure accuracy; however, inadvertent computerized transcription errors may still be present.

## 2021-11-28 NOTE — ED Provider Notes (Signed)
[facility] EMERGENCY DEPARTMENT Provider Note   CSN: [id number] Arrival date & time: 11/28/21  0630     History  Chief Complaint  Patient presents with   Ear Pain    Stephanie [name] is a 86 y.o. female.  With a history of asthma, hypertension, arthritis, CKD, A-fib who presents to the ED for evaluation of ear fullness.  Patient states she woke up yesterday and felt like it sounded like she was underwater.  Patient has not tried any home remedies.  States symptoms are constant.  Denies otalgia, otorrhea, rhinorrhea, nasal congestion, sore throat, fevers, chills, cough, shortness of breath, recent illness, history of inner ear abnormalities.  HPI     Home Medications Prior to Admission medications   Medication Sig Start Date End Date Taking? Authorizing Provider  albuterol (PROVENTIL) (2.5 MG/3ML) 0.083% nebulizer solution Take 2.5 mg by nebulization every 6 (six) hours as needed for wheezing or shortness of breath.  10/20/17   [provider]  allopurinol (ZYLOPRIM) 300 MG tablet Take 300 mg by mouth daily. 06/24/20   [provider]  apixaban (ELIQUIS) 5 MG TABS tablet Take 1 tablet (5 mg total) by mouth 2 (two) times daily. 07/03/21 10/14/21  Manuella Ghazi, Pratik D, DO  carvedilol (COREG) 6.25 MG tablet Take 1 tablet (6.25 mg total) by mouth 2 (two) times daily. 05/06/21   Imogene Burn, PA-C  cephALEXin (KEFLEX) 500 MG capsule Take 1 capsule (500 mg total) by mouth 4 (four) times daily. 09/29/21   Lajean Saver, MD  diclofenac Sodium (VOLTAREN) 1 % GEL Apply 2 g topically 4 (four) times daily as needed (pain). 05/20/21   [provider]  ferrous sulfate 325 (65 FE) MG EC tablet Take 325 mg by mouth daily with breakfast.    [provider]  fluticasone (FLONASE) 50 MCG/ACT nasal spray Place 2 sprays into both nostrils 2 (two) times daily. 08/20/21   [provider]  gabapentin (NEURONTIN) 100 MG capsule Take 100-200 mg by mouth at bedtime. 10/02/19    [provider]  GNP MUCUS RELIEF 400 MG TABS tablet Take 400 mg by mouth every 4 (four) hours as needed. 08/27/21   [provider]  HYDROcodone-acetaminophen (NORCO/VICODIN) 5-325 MG tablet Take 1 tablet by mouth daily as needed for moderate pain. 08/24/21   [provider]  Insulin Detemir (LEVEMIR FLEXTOUCH) 100 UNIT/ML Pen Inject 30 Units into the skin at bedtime. 02/10/19   Johnson, Clanford L, MD  metFORMIN (GLUCOPHAGE) 500 MG tablet Take 1 tablet (500 mg total) by mouth 2 (two) times daily. 07/02/20   Eileen Stanford, PA-C  traMADol (ULTRAM) 50 MG tablet Take 50 mg by mouth daily as needed for moderate pain. 05/20/21   [provider]      Allergies    Amoxicillin, Penicillins, Sulfa antibiotics, and Nitrofurantoin    Review of Systems   Review of Systems  All other systems reviewed and are negative.   Physical Exam Updated Vital Signs BP (!) 149/75 (BP Location: Right Arm)   Pulse (!) 55   Temp 97.9 F (36.6 C) (Oral)   Ht 5\' 5"  (1.651 m)   Wt 82.2 kg   SpO2 98%   BMI 30.16 kg/m  Physical Exam Vitals and nursing note reviewed.  Constitutional:      General: She is not in acute distress.    Appearance: Normal appearance. She is normal weight. She is not ill-appearing.  HENT:     Head: Normocephalic and atraumatic.  Right Ear: Hearing, tympanic membrane, ear canal and external ear normal. No decreased hearing noted. No tenderness. There is no impacted cerumen. No hemotympanum. Tympanic membrane is not injected, erythematous, retracted or bulging.     Left Ear: Hearing, tympanic membrane, ear canal and external ear normal. No decreased hearing noted. No tenderness. There is no impacted cerumen. No hemotympanum. Tympanic membrane is not injected, erythematous, retracted or bulging.  Pulmonary:     Effort: Pulmonary effort is normal. No respiratory distress.  Abdominal:     General: Abdomen is flat.  Musculoskeletal:        General:  Normal range of motion.     Cervical back: Neck supple.  Skin:    General: Skin is warm and dry.  Neurological:     Mental Status: She is alert and oriented to person, place, and time.  Psychiatric:        Mood and Affect: Mood normal.        Behavior: Behavior normal.     ED Results / Procedures / Treatments   Labs (all labs ordered are listed, but only abnormal results are displayed) Labs Reviewed - No data to display  EKG None  Radiology No results found.  Procedures Procedures    Medications Ordered in ED Medications - No data to display  ED Course/ Medical Decision Making/ A&P                           Medical Decision Making This patient presents to the ED for concern of hearing problems, this involves an extensive number of treatment options. The differential diagnosis includes otitis media, middle ear effusion, eustachian tube dysfunction   Co morbidities that complicate the patient evaluation   asthma, type 2 diabetes, hypertension, arthritis, CKD, A-fib  Additional history obtained from: Nursing notes from this visit.  Afebrile, hemodynamically stable.  Patient is an 86 year old female who presents to the ED for evaluation of hearing problems.  Physical exam unremarkable, specifically no signs of middle ear effusion or otitis media.  Patient has no noticeable hearing loss in either ear.  Patient states that she does occasionally have rhinorrhea.  Patient could have eustachian tube dysfunction versus slight middle ear effusion.  Educated patient to take over-the-counter Claritin, Zyrtec, or Allegra as well as intranasal Flonase and to follow-up with her primary care provider in 1 week for reassessment.  Gave patient return precautions.  Stable at discharge.  At this time there does not appear to be any evidence of an acute emergency medical condition and the patient appears stable for discharge with appropriate outpatient follow up. Diagnosis was discussed with  patient who verbalizes understanding of care plan and is agreeable to discharge. I have discussed return precautions with patient and daughter who verbalizes understanding. Patient encouraged to follow-up with their PCP within 1 week. All questions answered.  Patient's case discussed with Dr. Langston Masker who agrees with plan to discharge with follow-up.   Note: Portions of this report may have been transcribed using voice recognition software. Every effort was made to ensure accuracy; however, inadvertent computerized transcription errors may still be present.          Final Clinical Impression(s) / ED Diagnoses Final diagnoses:  None    Rx / DC Orders ED Discharge Orders     None         Roylene Reason, Hershal Coria 11/28/21 1033    Wyvonnia Dusky, MD 11/28/21 1511

## 2022-01-06 ENCOUNTER — Ambulatory Visit: Payer: Medicare PPO | Admitting: Emergency Medicine

## 2022-02-01 ENCOUNTER — Ambulatory Visit: Payer: Medicare PPO | Admitting: Adult Health

## 2022-02-05 ENCOUNTER — Ambulatory Visit (INDEPENDENT_AMBULATORY_CARE_PROVIDER_SITE_OTHER): Payer: Medicare HMO | Admitting: Internal Medicine

## 2022-02-05 ENCOUNTER — Encounter: Payer: Self-pay | Admitting: Adult Health

## 2022-02-05 ENCOUNTER — Ambulatory Visit: Payer: Medicare HMO | Admitting: Adult Health

## 2022-02-05 VITALS — BP 140/60 | HR 68 | Temp 97.9°F | Ht 65.0 in | Wt 181.0 lb

## 2022-02-05 DIAGNOSIS — R0609 Other forms of dyspnea: Secondary | ICD-10-CM | POA: Diagnosis not present

## 2022-02-05 DIAGNOSIS — J4530 Mild persistent asthma, uncomplicated: Secondary | ICD-10-CM | POA: Diagnosis not present

## 2022-02-05 DIAGNOSIS — R911 Solitary pulmonary nodule: Secondary | ICD-10-CM

## 2022-02-05 LAB — PULMONARY FUNCTION TEST
DL/VA % pred: 117 %
DL/VA: 4.7 ml/min/mmHg/L
DLCO cor % pred: 115 %
DLCO cor: 22.14 ml/min/mmHg
DLCO unc % pred: 115 %
DLCO unc: 22.14 ml/min/mmHg
FEF 25-75 Post: 0.67 L/sec
FEF 25-75 Pre: 0.59 L/sec
FEF2575-%Change-Post: 13 %
FEF2575-%Pred-Post: 53 %
FEF2575-%Pred-Pre: 46 %
FEV1-%Change-Post: 3 %
FEV1-%Pred-Post: 47 %
FEV1-%Pred-Pre: 46 %
FEV1-Post: 0.91 L
FEV1-Pre: 0.87 L
FEV1FVC-%Change-Post: -1 %
FEV1FVC-%Pred-Pre: 96 %
FEV6-%Change-Post: 5 %
FEV6-%Pred-Post: 54 %
FEV6-%Pred-Pre: 51 %
FEV6-Post: 1.3 L
FEV6-Pre: 1.24 L
FEV6FVC-%Pred-Post: 106 %
FEV6FVC-%Pred-Pre: 106 %
FVC-%Change-Post: 5 %
FVC-%Pred-Post: 50 %
FVC-%Pred-Pre: 48 %
FVC-Post: 1.3 L
FVC-Pre: 1.24 L
Post FEV1/FVC ratio: 70 %
Post FEV6/FVC ratio: 100 %
Pre FEV1/FVC ratio: 71 %
Pre FEV6/FVC Ratio: 100 %
RV % pred: 102 %
RV: 2.59 L
TLC % pred: 79 %
TLC: 4.16 L

## 2022-02-05 NOTE — Progress Notes (Signed)
Has not had the flu vaccine and declined today.  Has not had the covid vaccines.

## 2022-02-05 NOTE — Assessment & Plan Note (Signed)
Asthma appears to be stable without flare of symptoms.  Patient has pretty significant restriction on her PFTs.  No airflow obstructions.  She says that she has tried inhalers in the past.  But is currently using albuterol about once a day and this seems to manage her symptoms.  We discussed adding an additional therapy but she does not feel that she needs it.  Plan  Patient Instructions  I will be in touch regarding biopsy of lung nodule  Albuterol inhaler As needed   Follow up with Dr. [name]  in 6-8 weeks and  As needed

## 2022-02-05 NOTE — Progress Notes (Signed)
@Patient  ID: Stephanie [name], female    DOB: April 17, 1935, 87 y.o.   MRN: [id number]  Chief Complaint  Patient presents with   Follow-up    Referring provider: [facility]  HPI: 87 year old female former smoker seen for lung nodules  TEST/EVENTS :  CT chest October 30, 2021 showed biapical scarring, 1.7 x 1.0 ill-defined nodule in the right lower lobe with slightly increased size previously measured 1.6 x 0.8.  This area is slowly grown since 2015. PET scan on May 12, 2020 showed low metabolic activity in the right upper lobe nodule SUV max 1.5  CT chest November 04, 2013 8 x 7 irregular nodule right middle lobe  02/05/2022 Follow up : Lung nodule  Patient returns for 6-month follow-up.  Patient is being followed for a right sided lung nodule that has had slow growth since 2015.  Had a PET scan and April 2022 that showed low metabolic activity with SUV max at 1.5.  Most recent CT chest October 30, 2021 showed a 1.7 x 1.0 cm ill-defined nodule in the right lower lobe that is slightly increased in size previously measuring 1.6 x 0.8 cm.  Patient was set up for PFTs that were completed today.  She has a known history of asthma.  Says overall her breathing is doing okay.  She does get short of breath with heavy activities.  That is somewhat sedentary.  She uses albuterol usually about once a day.  She has tried inhalers in the past and did not feel that she had any significant benefit.  She denies any cough or wheezing. PFTs show moderate to severe restriction with an FEV1 at 47%, ratio 70, FVC 50%, no significant bronchodilator response, DLCO is 115%.  We reviewed her pulmonary function testing in detail.   Allergies  Allergen Reactions   Amoxicillin Swelling    Tongue swelling    Penicillins Swelling    Has patient had a PCN reaction causing immediate rash, facial/tongue/throat swelling, SOB or lightheadedness with hypotension: No Has patient had a PCN reaction causing  severe rash involving mucus membranes or skin necrosis: No Has patient had a PCN reaction that required hospitalization: No Has patient had a PCN reaction occurring within the last 10 years: No If all of the above answers are "NO", then may proceed with Cephalosporin use.    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 Burt Knack and Dr. Cyndia Bent.   Severe aortic stenosis    Symptomatic anemia    Presumed GI bleed January 2021 - she declined GI work-up   Type 2 diabetes mellitus ([place])     Tobacco History: Social History   Tobacco Use  Smoking Status Former   Packs/day: 1.00   Years: 7.00   Total pack years: 7.00   Types: Cigarettes  Smokeless Tobacco Never   Counseling given: Not Answered   Outpatient Medications Prior to Visit  Medication Sig Dispense Refill  albuterol (PROVENTIL) (2.5 MG/3ML) 0.083% nebulizer solution Take 2.5 mg by nebulization every 6 (six) hours as needed for wheezing or shortness of breath.   2   allopurinol (ZYLOPRIM) 300 MG tablet Take 300 mg by mouth daily.     carvedilol (COREG) 6.25 MG tablet Take 1 tablet (6.25 mg total) by mouth 2 (two) times daily. 180 tablet 3   diclofenac Sodium (VOLTAREN) 1 % GEL Apply 2 g topically 4 (four) times daily as needed (pain).     ferrous sulfate 325 (65 FE) MG EC tablet Take 325 mg by mouth daily with breakfast.     fluticasone (FLONASE) 50 MCG/ACT nasal spray Place 2 sprays into both nostrils 2 (two)  times daily.     gabapentin (NEURONTIN) 100 MG capsule Take 100-200 mg by mouth at bedtime.     GNP MUCUS RELIEF 400 MG TABS tablet Take 400 mg by mouth every 4 (four) hours as needed.     HYDROcodone-acetaminophen (NORCO/VICODIN) 5-325 MG tablet Take 1 tablet by mouth daily as needed for moderate pain.     Insulin Detemir (LEVEMIR FLEXTOUCH) 100 UNIT/ML Pen Inject 30 Units into the skin at bedtime. 15 mL 11   metFORMIN (GLUCOPHAGE) 500 MG tablet Take 1 tablet (500 mg total) by mouth 2 (two) times daily.     traMADol (ULTRAM) 50 MG tablet Take 50 mg by mouth daily as needed for moderate pain.     apixaban (ELIQUIS) 5 MG TABS tablet Take 1 tablet (5 mg total) by mouth 2 (two) times daily. 120 tablet 0   cephALEXin (KEFLEX) 500 MG capsule Take 1 capsule (500 mg total) by mouth 4 (four) times daily. (Patient not taking: Reported on 02/05/2022) 20 capsule 0   No facility-administered medications prior to visit.     Review of Systems:   Constitutional:   No  weight loss, night sweats,  Fevers, chills, fatigue, or  lassitude.  HEENT:   No headaches,  Difficulty swallowing,  Tooth/dental problems, or  Sore throat,                No sneezing, itching, ear ache, nasal congestion, post nasal drip,   CV:  No chest pain,  Orthopnea, PND, swelling in lower extremities, anasarca, dizziness, palpitations, syncope.   GI  No heartburn, indigestion, abdominal pain, nausea, vomiting, diarrhea, change in bowel habits, loss of appetite, bloody stools.   Resp: No shortness of breath with exertion or at rest.  No excess mucus, no productive cough,  No non-productive cough,  No coughing up of blood.  No change in color of mucus.  No wheezing.  No chest wall deformity  Skin: no rash or lesions.  GU: no dysuria, change in color of urine, no urgency or frequency.  No flank pain, no hematuria   MS:  No joint pain or swelling.  No decreased range of motion.  No back pain.    Physical Exam  BP (!) 140/60 (BP  Location: Right Arm, Patient Position: Sitting, Cuff Size: Normal)   Pulse 68   Temp 97.9 F (36.6 C) (Oral)   Ht 5\' 5"  (1.651 m)   Wt 181 lb (82.1 kg)   SpO2 98%   BMI 30.12 kg/m   GEN: A/Ox3; pleasant , NAD, well nourished    HEENT:  [place]/AT,  EACs-clear, TMs-wnl, NOSE-clear, THROAT-clear, no lesions, no postnasal drip or exudate noted.   NECK:  Supple w/ fair ROM; no JVD; normal carotid impulses w/o bruits; no thyromegaly or nodules palpated;  no lymphadenopathy.    RESP  Clear  P & A; w/o, wheezes/ rales/ or rhonchi. no accessory muscle use, no dullness to percussion  CARD:  RRR, no m/r/g, no peripheral edema, pulses intact, no cyanosis or clubbing.  GI:   Soft & nt; nml bowel sounds; no organomegaly or masses detected.   Musco: Warm bil, no deformities or joint swelling noted.   Neuro: alert, no focal deficits noted.    Skin: Warm, no lesions or rashes    Lab Results:  CBC    Component Value Date/Time   WBC 9.8 10/13/2021 1808   RBC 4.52 10/13/2021 1808   HGB 12.3 10/13/2021 1808   HGB 12.3 07/20/2021 1324   HCT 39.1 10/13/2021 1808   HCT 38.5 07/20/2021 1324   PLT 221 10/13/2021 1808   PLT 232 07/20/2021 1324   MCV 86.5 10/13/2021 1808   MCV 85 07/20/2021 1324   MCH 27.2 10/13/2021 1808   MCHC 31.5 10/13/2021 1808   RDW 15.4 10/13/2021 1808   RDW 14.9 07/20/2021 1324   LYMPHSABS 1.4 10/13/2021 1808   MONOABS 0.8 10/13/2021 1808   EOSABS 0.4 10/13/2021 1808   BASOSABS 0.0 10/13/2021 1808    BMET    Component Value Date/Time   NA 138 10/13/2021 1808   NA 139 07/20/2021 1324   K 4.5 10/13/2021 1808   CL 99 10/13/2021 1808   CO2 28 10/13/2021 1808   GLUCOSE 104 (H) 10/13/2021 1808   BUN 22 10/13/2021 1808   BUN 24 07/20/2021 1324   CREATININE 1.51 (H) 10/13/2021 1808   CALCIUM 10.5 (H) 10/13/2021 1808   GFRNONAA 34 (L) 10/13/2021 1808   GFRAA 49 (L) 02/10/2019 0610    BNP    Component Value Date/Time   BNP 267.0 (H) 10/13/2021 1808     ProBNP    Component Value Date/Time   PROBNP 258.2 11/29/2013 2039    Imaging: No results found.       Latest Ref Rng & Units 02/05/2022    2:45 PM  PFT Results  FVC-Pre L 1.24  P  FVC-Predicted Pre % 48  P  FVC-Post L 1.30  P  FVC-Predicted Post % 50  P  Pre FEV1/FVC % % 71  P  Post FEV1/FCV % % 70  P  FEV1-Pre L 0.87  P  FEV1-Predicted Pre % 46  P  FEV1-Post L 0.91  P  DLCO uncorrected ml/min/mmHg 22.14  P  DLCO UNC% % 115  P  DLCO corrected ml/min/mmHg 22.14  P  DLCO COR %Predicted % 115  P  DLVA Predicted % 117  P  TLC L 4.16  P  TLC % Predicted % 79  P  RV % Predicted % 102  P    P Preliminary result    No results found for: "NITRICOXIDE"      Assessment & Plan:   Asthma Asthma appears to be stable without flare of symptoms.  Patient has pretty significant restriction on her PFTs.  No airflow obstructions.  She says that she has tried inhalers in the past.  But is currently using albuterol about once a day and this seems to manage her symptoms.  We discussed adding an additional therapy but she does not feel that she needs it.  Plan  Patient Instructions  I will be in touch regarding biopsy of lung nodule  Albuterol inhaler As needed   Follow up with Dr. [name]  in 6-8 weeks and  As needed  Solitary pulmonary nodule on lung CT Slowly growing right sided lung nodule.  Most recent CT chest shows slight growth.  Previous PET scan showed some low-grade metabolic activity.  PFTs show moderate to severe restriction with normal diffusing capacity.  Will discuss with Dr. Tonia Brooms regarding tissue sampling of her right-sided lesion     Rubye Oaks, NP 02/05/2022

## 2022-02-05 NOTE — Patient Instructions (Signed)
I will be in touch regarding biopsy of lung nodule  Albuterol inhaler As needed   Follow up with Dr. [name]  in 6-8 weeks and  As needed

## 2022-02-05 NOTE — Progress Notes (Signed)
PFT done today. 

## 2022-02-05 NOTE — H&P (View-Only) (Signed)
@Patient  ID: Stephanie [name], female    DOB: [birth year], 87 y.o.   MRN: [id number]  Chief Complaint  Patient presents with   Follow-up    Referring provider: [facility]  HPI: 87 year old female former smoker seen for lung nodules  TEST/EVENTS :  CT chest October 30, 2021 showed biapical scarring, 1.7 x 1.0 ill-defined nodule in the right lower lobe with slightly increased size previously measured 1.6 x 0.8.  This area is slowly grown since 2015. PET scan on May 12, 2020 showed low metabolic activity in the right upper lobe nodule SUV max 1.5  CT chest November 04, 2013 8 x 7 irregular nodule right middle lobe  02/05/2022 Follow up : Lung nodule  Patient returns for 47-month follow-up.  Patient is being followed for a right sided lung nodule that has had slow growth since 2015.  Had a PET scan and April 2022 that showed low metabolic activity with SUV max at 1.5.  Most recent CT chest October 30, 2021 showed a 1.7 x 1.0 cm ill-defined nodule in the right lower lobe that is slightly increased in size previously measuring 1.6 x 0.8 cm.  Patient was set up for PFTs that were completed today.  She has a known history of asthma.  Says overall her breathing is doing okay.  She does get short of breath with heavy activities.  That is somewhat sedentary.  She uses albuterol usually about once a day.  She has tried inhalers in the past and did not feel that she had any significant benefit.  She denies any cough or wheezing. PFTs show moderate to severe restriction with an FEV1 at 47%, ratio 70, FVC 50%, no significant bronchodilator response, DLCO is 115%.  We reviewed her pulmonary function testing in detail.   Allergies  Allergen Reactions   Amoxicillin Swelling    Tongue swelling    Penicillins Swelling    Has patient had a PCN reaction causing immediate rash, facial/tongue/throat swelling, SOB or lightheadedness with hypotension: No Has patient had a PCN reaction causing  severe rash involving mucus membranes or skin necrosis: No Has patient had a PCN reaction that required hospitalization: No Has patient had a PCN reaction occurring within the last 10 years: No If all of the above answers are "NO", then may proceed with Cephalosporin use.    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 Burt Knack and Dr. Cyndia Bent.   Severe aortic stenosis    Symptomatic anemia    Presumed GI bleed January 2021 - she declined GI work-up   Type 2 diabetes mellitus ([place])     Tobacco History: Social History   Tobacco Use  Smoking Status Former   Packs/day: 1.00   Years: 7.00   Total pack years: 7.00   Types: Cigarettes  Smokeless Tobacco Never   Counseling given: Not Answered   Outpatient Medications Prior to Visit  Medication Sig Dispense Refill  albuterol (PROVENTIL) (2.5 MG/3ML) 0.083% nebulizer solution Take 2.5 mg by nebulization every 6 (six) hours as needed for wheezing or shortness of breath.   2   allopurinol (ZYLOPRIM) 300 MG tablet Take 300 mg by mouth daily.     carvedilol (COREG) 6.25 MG tablet Take 1 tablet (6.25 mg total) by mouth 2 (two) times daily. 180 tablet 3   diclofenac Sodium (VOLTAREN) 1 % GEL Apply 2 g topically 4 (four) times daily as needed (pain).     ferrous sulfate 325 (65 FE) MG EC tablet Take 325 mg by mouth daily with breakfast.     fluticasone (FLONASE) 50 MCG/ACT nasal spray Place 2 sprays into both nostrils 2 (two)  times daily.     gabapentin (NEURONTIN) 100 MG capsule Take 100-200 mg by mouth at bedtime.     GNP MUCUS RELIEF 400 MG TABS tablet Take 400 mg by mouth every 4 (four) hours as needed.     HYDROcodone-acetaminophen (NORCO/VICODIN) 5-325 MG tablet Take 1 tablet by mouth daily as needed for moderate pain.     Insulin Detemir (LEVEMIR FLEXTOUCH) 100 UNIT/ML Pen Inject 30 Units into the skin at bedtime. 15 mL 11   metFORMIN (GLUCOPHAGE) 500 MG tablet Take 1 tablet (500 mg total) by mouth 2 (two) times daily.     traMADol (ULTRAM) 50 MG tablet Take 50 mg by mouth daily as needed for moderate pain.     apixaban (ELIQUIS) 5 MG TABS tablet Take 1 tablet (5 mg total) by mouth 2 (two) times daily. 120 tablet 0   cephALEXin (KEFLEX) 500 MG capsule Take 1 capsule (500 mg total) by mouth 4 (four) times daily. (Patient not taking: Reported on 02/05/2022) 20 capsule 0   No facility-administered medications prior to visit.     Review of Systems:   Constitutional:   No  weight loss, night sweats,  Fevers, chills, fatigue, or  lassitude.  HEENT:   No headaches,  Difficulty swallowing,  Tooth/dental problems, or  Sore throat,                No sneezing, itching, ear ache, nasal congestion, post nasal drip,   CV:  No chest pain,  Orthopnea, PND, swelling in lower extremities, anasarca, dizziness, palpitations, syncope.   GI  No heartburn, indigestion, abdominal pain, nausea, vomiting, diarrhea, change in bowel habits, loss of appetite, bloody stools.   Resp: No shortness of breath with exertion or at rest.  No excess mucus, no productive cough,  No non-productive cough,  No coughing up of blood.  No change in color of mucus.  No wheezing.  No chest wall deformity  Skin: no rash or lesions.  GU: no dysuria, change in color of urine, no urgency or frequency.  No flank pain, no hematuria   MS:  No joint pain or swelling.  No decreased range of motion.  No back pain.    Physical Exam  BP (!) 140/60 (BP  Location: Right Arm, Patient Position: Sitting, Cuff Size: Normal)   Pulse 68   Temp 97.9 F (36.6 C) (Oral)   Ht 5\' 5"  (1.651 m)   Wt 181 lb (82.1 kg)   SpO2 98%   BMI 30.12 kg/m   GEN: A/Ox3; pleasant , NAD, well nourished    HEENT:  [place]/AT,  EACs-clear, TMs-wnl, NOSE-clear, THROAT-clear, no lesions, no postnasal drip or exudate noted.   NECK:  Supple w/ fair ROM; no JVD; normal carotid impulses w/o bruits; no thyromegaly or nodules palpated;  no lymphadenopathy.    RESP  Clear  P & A; w/o, wheezes/ rales/ or rhonchi. no accessory muscle use, no dullness to percussion  CARD:  RRR, no m/r/g, no peripheral edema, pulses intact, no cyanosis or clubbing.  GI:   Soft & nt; nml bowel sounds; no organomegaly or masses detected.   Musco: Warm bil, no deformities or joint swelling noted.   Neuro: alert, no focal deficits noted.    Skin: Warm, no lesions or rashes    Lab Results:  CBC    Component Value Date/Time   WBC 9.8 10/13/2021 1808   RBC 4.52 10/13/2021 1808   HGB 12.3 10/13/2021 1808   HGB 12.3 07/20/2021 1324   HCT 39.1 10/13/2021 1808   HCT 38.5 07/20/2021 1324   PLT 221 10/13/2021 1808   PLT 232 07/20/2021 1324   MCV 86.5 10/13/2021 1808   MCV 85 07/20/2021 1324   MCH 27.2 10/13/2021 1808   MCHC 31.5 10/13/2021 1808   RDW 15.4 10/13/2021 1808   RDW 14.9 07/20/2021 1324   LYMPHSABS 1.4 10/13/2021 1808   MONOABS 0.8 10/13/2021 1808   EOSABS 0.4 10/13/2021 1808   BASOSABS 0.0 10/13/2021 1808    BMET    Component Value Date/Time   NA 138 10/13/2021 1808   NA 139 07/20/2021 1324   K 4.5 10/13/2021 1808   CL 99 10/13/2021 1808   CO2 28 10/13/2021 1808   GLUCOSE 104 (H) 10/13/2021 1808   BUN 22 10/13/2021 1808   BUN 24 07/20/2021 1324   CREATININE 1.51 (H) 10/13/2021 1808   CALCIUM 10.5 (H) 10/13/2021 1808   GFRNONAA 34 (L) 10/13/2021 1808   GFRAA 49 (L) 02/10/2019 0610    BNP    Component Value Date/Time   BNP 267.0 (H) 10/13/2021 1808     ProBNP    Component Value Date/Time   PROBNP 258.2 11/29/2013 2039    Imaging: No results found.       Latest Ref Rng & Units 02/05/2022    2:45 PM  PFT Results  FVC-Pre L 1.24  P  FVC-Predicted Pre % 48  P  FVC-Post L 1.30  P  FVC-Predicted Post % 50  P  Pre FEV1/FVC % % 71  P  Post FEV1/FCV % % 70  P  FEV1-Pre L 0.87  P  FEV1-Predicted Pre % 46  P  FEV1-Post L 0.91  P  DLCO uncorrected ml/min/mmHg 22.14  P  DLCO UNC% % 115  P  DLCO corrected ml/min/mmHg 22.14  P  DLCO COR %Predicted % 115  P  DLVA Predicted % 117  P  TLC L 4.16  P  TLC % Predicted % 79  P  RV % Predicted % 102  P    P Preliminary result    No results found for: "NITRICOXIDE"      Assessment & Plan:   Asthma Asthma appears to be stable without flare of symptoms.  Patient has pretty significant restriction on her PFTs.  No airflow obstructions.  She says that she has tried inhalers in the past.  But is currently using albuterol about once a day and this seems to manage her symptoms.  We discussed adding an additional therapy but she does not feel that she needs it.  Plan  Patient Instructions  I will be in touch regarding biopsy of lung nodule  Albuterol inhaler As needed   Follow up with Dr. [name]  in 6-8 weeks and  As needed  Solitary pulmonary nodule on lung CT Slowly growing right sided lung nodule.  Most recent CT chest shows slight growth.  Previous PET scan showed some low-grade metabolic activity.  PFTs show moderate to severe restriction with normal diffusing capacity.  Will discuss with Dr. Tonia Brooms regarding tissue sampling of her right-sided lesion     Rubye Oaks, NP 02/05/2022

## 2022-02-05 NOTE — Assessment & Plan Note (Addendum)
Slowly growing right sided lung nodule.  Most recent CT chest shows slight growth.  Previous PET scan showed some low-grade metabolic activity.  PFTs show moderate to severe restriction with normal diffusing capacity.  Will discuss with Dr. Tonia Brooms regarding tissue sampling of her right-sided lesion  Late Add : Discussed case and films with Dr. Tonia Brooms. Will proceed with Navigational Bronchoscopy for tissue sampling. Called patient daughter Stephanie [name] , per patient request . Discussed plan with her.  Will need to hold Eliquis per procedure protocol . Order sent to procedure pool to schedule.

## 2022-02-08 ENCOUNTER — Encounter: Payer: Self-pay | Admitting: Internal Medicine

## 2022-02-09 ENCOUNTER — Other Ambulatory Visit: Payer: Self-pay

## 2022-02-09 ENCOUNTER — Telehealth: Payer: Self-pay | Admitting: Pulmonary Disease

## 2022-02-09 DIAGNOSIS — R911 Solitary pulmonary nodule: Secondary | ICD-10-CM

## 2022-02-09 NOTE — Telephone Encounter (Signed)
Pccm:  Right lung nodule Discussed cased with TP Has been followed for sometime  We will get her scheduled for case ina new weeks. If the 13th doesn't work please let me know   Stephanie Igo, DO [facility] 02/09/2022 4:33 PM

## 2022-02-10 ENCOUNTER — Other Ambulatory Visit: Payer: Self-pay

## 2022-02-10 ENCOUNTER — Encounter: Payer: Self-pay | Admitting: Pulmonary Disease

## 2022-02-10 DIAGNOSIS — R911 Solitary pulmonary nodule: Secondary | ICD-10-CM

## 2022-02-10 NOTE — Telephone Encounter (Signed)
Please call her daughter , she is the point of contact. Kathryne Sharper

## 2022-02-10 NOTE — Telephone Encounter (Signed)
Pt has been scheduled for 2/13.  Tried to call her but keep getting busy signal.  Will try back later.

## 2022-02-10 NOTE — Telephone Encounter (Signed)
Just called pt's dtr Stephanie [name] and gave her appt info.  Appt info is in referral.  Will close out phone note.

## 2022-02-12 ENCOUNTER — Ambulatory Visit
Admission: RE | Admit: 2022-02-12 | Discharge: 2022-02-12 | Disposition: A | Discharge status: Home / Self Care | Payer: Medicare HMO | Source: Ambulatory Visit | Attending: Pulmonary Disease | Admitting: Pulmonary Disease

## 2022-02-12 DIAGNOSIS — R911 Solitary pulmonary nodule: Secondary | ICD-10-CM

## 2022-02-15 NOTE — Progress Notes (Signed)
Tried calling the pt. The line is busy. Will try back later.

## 2022-02-24 NOTE — Progress Notes (Signed)
ATC x2.  The line was busy.

## 2022-03-01 ENCOUNTER — Encounter (HOSPITAL_COMMUNITY): Payer: Self-pay | Admitting: Pulmonary Disease

## 2022-03-01 ENCOUNTER — Other Ambulatory Visit: Payer: Self-pay

## 2022-03-01 NOTE — Progress Notes (Signed)
Anesthesia Chart Review: Stephanie [name]  Case: [id number] Date/Time: 03/02/22 1045   Procedure: ROBOTIC ASSISTED NAVIGATIONAL BRONCHOSCOPY (Right) - ION w/ CIOS   Anesthesia type: General   Diagnosis: Lung nodule [R91.1]   Pre-op diagnosis: lung nodule   Location: [facility] / [facility]   Surgeons: Garner Nash, DO       DISCUSSION: Patient is an 87 year old female scheduled for the above procedure.  History includes former smoker, aortic stenosis (s/p 26 mm TAVR 07/01/20), PAF (03/05/21; 06/2021), DM2, CKD, HTN, HLD, PAD, asthma, anemia (presumed GI bleed 01/2019, s/p PRBC, was scheduled for EGD/colonoscopy with [facility] in [place], unsure if completed), diverticulitis (06/2021),, back surgery. Mild non-obstructive CAD 03/2020 LHC.   Last visit with cardiologist Dr. Domenic Polite was on 10/14/21 for follow-up after ED visit for atypical chest pain. During evaluation, a RLL lung density was noted on CXR (previously noted on 03/05/21 CTA with one year follow-up recommended). June 2023 echo showed 'LVEF 50 to 40%, mild diastolic dysfunction, RVSP estimated 38 mmHg, moderate biatrial enlargement, and stable TAVR with mean gradient 8.3 mmHg and no significant paravalvular regurgitation." March 2022 cardiac catheterization done prior to TAVR showed nonobstructive CAD. She was tolerating Eliquis for PAF. Six month cardiology follow-up planned, but in the interim, he ordered a repeat chest CT which was done on 11/02/21 which showed minimal increase in RLL nodule size but features more suspicious for neoplasm and was referred back to pulmonologist Dr. [name] who referred her to their [name] location for PFTs and ultimately to Dr. Valeta Harms for above procedure.  She said she is no longer on Eliquis--She had been told by pulmonology on 02/05/22 that she would need to hold for procedure, but listed as not taking per "Patient Preference."    Anesthesia team to evaluate on the day of  surgery. Medical Transportation will be bringing her here, and the earliest they will arrive is 8:00 AM.   VS:  BP Readings from Last 3 Encounters:  02/05/22 (!) 140/60  11/28/21 (S) (!) 174/76  11/13/21 132/78   Pulse Readings from Last 3 Encounters:  02/05/22 68  11/28/21 (!) 58  11/13/21 62     PROVIDERS: PCP is with [facility] Rozann Lesches, MD is cardiologist Christinia Gully, MD is pulmonologist Nicolette Bang MD is urologist. Evaluated 05/23/20 for left renal mass seen on 04/25/20 CT. He discussed renal ablation, surgery, or active surveillance. Patient opted for surveillance.  Eulogio Ditch, NP is vascular surgeon provider. Last visit 02/13/21 for PAD.    LABS: For day of surgery as indicated. Last results in [facility] include: Lab Results  Component Value Date   WBC 9.8 10/13/2021   HGB 12.3 10/13/2021   HCT 39.1 10/13/2021   PLT 221 10/13/2021   GLUCOSE 104 (H) 10/13/2021   ALT 9 07/03/2021   AST 13 (L) 07/03/2021   NA 138 10/13/2021   K 4.5 10/13/2021   CL 99 10/13/2021   CREATININE 1.51 (H) 10/13/2021   BUN 22 10/13/2021   CO2 28 10/13/2021   TSH 3.200 07/02/2021   HGBA1C 7.5 (H) 07/02/2021    PFTs 02/05/22: "PFTs show moderate to severe restriction with normal diffusing capacity." FVC 1.24 (48%, post 1.30 (50%). FEV1 0.87 (46%), post 0.81 (47%). FEV1/FVC ratio 71% (96%), post 70%. DLCO unc/cor 22/14 (115%).     IMAGES: CT Super D Chest 02/12/22: IMPRESSION: 1. Subsolid right lower lobe nodule, stable from 04/25/2020 but enlarged from 11/04/2013. Lesion  remains worrisome for indolent adenocarcinoma. 2. Left adrenal adenoma. 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 4. Aortic atherosclerosis (ICD10-I70.0). Coronary artery calcification. 5. Enlarged pulmonic trunk, indicative of pulmonary arterial hypertension. 6.  Emphysema (ICD10-J43.9).    EKG: 10/13/21: Sinus rhythm with Premature atrial complexes Left anterior  fascicular block Minimal voltage criteria for LVH, may be normal variant ( R in aVL ) Septal infarct (cited on or before 13-Oct-2021) Abnormal ECG When compared with ECG of 29-Sep-2021 16:13, Questionable change in initial forces of Septal leads Confirmed by Aletta Edouard [phone]) on 10/13/2021 3:12:13 PM   CV: Echo 07/02/21: IMPRESSIONS   1. Left ventricular ejection fraction, by estimation, is 50 to 55%. Left  ventricular ejection fraction by 2D MOD biplane is 51.9 %. The left  ventricle has low normal function. The left ventricle has no regional wall  motion abnormalities. There is  moderate left ventricular hypertrophy. Left ventricular diastolic  parameters are consistent with Grade I diastolic dysfunction (impaired  relaxation).   2. Right ventricular systolic function is normal. The right ventricular  size is normal. There is mildly elevated pulmonary artery systolic  pressure. The estimated right ventricular systolic pressure is 10.2 mmHg.   3. Left atrial size was moderately dilated.   4. Right atrial size was moderately dilated.   5. The mitral valve is abnormal. Trivial mitral valve regurgitation.   6. The aortic valve has been repaired/replaced. Aortic valve  regurgitation is not visualized. There is a 26 mm Sapien prosthetic (TAVR)  valve present in the aortic position. Procedure Date: 07/01/2020. Echo  findings are consistent with normal structure  and function of the aortic valve prosthesis. Aortic valve area, by VTI  measures 1.83 cm. Aortic valve mean gradient measures 8.3 mmHg. Aortic  valve Vmax measures 2.10 m/s.  - Comparison(s): Changes from prior study are noted. 08/07/2020: LVEF 45-50%,  TAVR - mean gradient 9 mmHg.     Long term Ziopatch monitor 07/25/20 ZIO AT reviewed. 12 days, 21 hours analyzed. Predominant rhythm is sinus with prolonged PR interval, heart rate ranging from 58 bpm up to 126 bpm and average heart rate 90 bpm. There were frequent PACs  representing 14.5% total beats, occasional atrial couplets at 1.5% total beats, and rare atrial triplets. There were occasional PVCs representing 1.1% total beats. Multiple episodes of SVT were noted, relatively brief in duration with the longest lasting at 14 beats and at average heart rate 133 bpm. Brief episodes of NSVT were also noted, there was an episode of probable aberrant SVT lasting 21 beats at 8.4 seconds. No pauses.    Carotid US 04/11/20: Summary:  - Right Carotid: Velocities in the right ICA are consistent with a 1-39% stenosis.  - Left Carotid: Velocities in the left ICA are consistent with a 1-39% stenosis. Significant tortuosity of the left ICA noted.  - Vertebrals:  Bilateral vertebral arteries demonstrate antegrade flow.  - Subclavians: Normal flow hemodynamics were seen in bilateral subclavian arteries.     RHC/LHC 04/10/20 (PRE-TAVR): Mid Cx lesion is 50% stenosed. LV end diastolic pressure is mildly elevated. There is mild aortic valve stenosis. Calculated valve area 1.56 cm2. Ao sat 98%, PA sat 73%, PA pressure 39/5, mean PA pressure 19 mm Hg; mean PCWP 14 mm Hg; CO 6.5 L/min; CI 3.3. Small radial artery. Ulnar access obtained. Vasospasm noted. Nonobstructive CAD.  Mild aortic stenosis.       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 Burt Knack and Dr. Cyndia Bent.   Severe aortic stenosis    Symptomatic anemia    Presumed GI bleed January 2021 - she declined GI work-up   Type 2 diabetes mellitus ([place])     Past Surgical History:  Procedure Laterality Date   APPENDECTOMY     BACK SURGERY     CYST REMOVAL HAND     Right,  hospital in [place] ECHOCARDIOGRAM Left 07/01/2020   Procedure: INTRAOPERATIVE TRANSTHORACIC ECHOCARDIOGRAM;  Surgeon: Sherren Mocha, MD;  Location: [facility];  Service: Open Heart Surgery;  Laterality: Left;   KNEE ARTHROSCOPY WITH MEDIAL MENISECTOMY Right 02/28/2012   Procedure: KNEE ARTHROSCOPY WITH MEDIAL MENISECTOMY;  Surgeon: Carole Civil, MD;  Location: [facility];  Service: Orthopedics;  Laterality: Right;   REPAIR KNEE LIGAMENT     RIGHT/LEFT HEART CATH AND CORONARY ANGIOGRAPHY N/A 04/10/2020   Procedure: RIGHT/LEFT HEART CATH AND CORONARY ANGIOGRAPHY;  Surgeon: Jettie Booze, MD;  Location: [facility];  Service: Cardiovascular;  Laterality: N/A;   TRANSCATHETER AORTIC VALVE REPLACEMENT, TRANSFEMORAL N/A 07/01/2020   Procedure: TRANSCATHETER AORTIC VALVE REPLACEMENT, TRANSFEMORAL;  Surgeon: Sherren Mocha, MD;  Location: [facility];  Service: Open Heart Surgery;  Laterality: N/A;   ULTRASOUND GUIDANCE FOR VASCULAR ACCESS Bilateral 07/01/2020   Procedure: ULTRASOUND GUIDANCE FOR VASCULAR ACCESS;  Surgeon: Sherren Mocha, MD;  Location: [facility];  Service: Open Heart Surgery;  Laterality: Bilateral;    MEDICATIONS: No current facility-administered medications for this encounter.    albuterol (PROVENTIL) (2.5 MG/3ML) 0.083% nebulizer solution   carvedilol (COREG) 6.25 MG tablet   ferrous sulfate 325 (65 FE) MG EC tablet   furosemide (LASIX) 40 MG tablet   gabapentin (NEURONTIN) 100 MG capsule   Insulin Detemir (LEVEMIR FLEXTOUCH) 100 UNIT/ML Pen   metFORMIN (GLUCOPHAGE) 500 MG tablet   neomycin-polymyxin-hydrocortisone (CORTISPORIN) 3.5-10000-1 OTIC suspension   apixaban (ELIQUIS) 5 MG TABS tablet    Myra Gianotti, PA-C Surgical Short Stay/Anesthesiology [facility] Phone [phone] [facility] Phone [phone] 03/01/2022 12:49 PM

## 2022-03-01 NOTE — Progress Notes (Signed)
Spoke with pt's daughter, Lattie Haw for pre-op call, DPR on file. Pt has hx of A-fib and status post TAVR in 2022. Pt is no longer on Eliquis. Dr. Domenic Polite is her cardiologist. Last appt with him was 10/14/21. Lattie Haw states pt has done well. Pt is a type 2 diabetic. Last A1C was 7.5 on 07/02/21. Lattie Haw states pt does not check her blood sugar at home.Instructed Lattie Haw to have pt take 1/2 of her normal dose of Levemir Insulin tonight. She will take 20 units tonight. Pt is not to take her Metfomin in the AM.   Shower instructions given to [employer].   Medical Transportation will be bringing pt here and the earliest they will arrive is 8 AM.   Covid test needed DOS.

## 2022-03-01 NOTE — Anesthesia Preprocedure Evaluation (Signed)
Anesthesia Evaluation  Patient identified by MRN, date of birth, ID band Patient awake    Reviewed: Allergy & Precautions, H&P , NPO status , Patient's Chart, lab work & pertinent test results  Airway Mallampati: II   Neck ROM: full    Dental   Pulmonary asthma , COPD, former smoker   breath sounds clear to auscultation       Cardiovascular hypertension, + DOE  + Valvular Problems/Murmurs  Rhythm:regular Rate:Normal  S/p TAVR for severe AS.   Neuro/Psych    GI/Hepatic   Endo/Other  diabetes, Type 2    Renal/GU Renal InsufficiencyRenal disease     Musculoskeletal  (+) Arthritis ,    Abdominal   Peds  Hematology   Anesthesia Other Findings   Reproductive/Obstetrics                             Anesthesia Physical Anesthesia Plan  ASA: 3  Anesthesia Plan: General   Post-op Pain Management:    Induction: Intravenous  PONV Risk Score and Plan: 3 and Ondansetron, Dexamethasone and Treatment may vary due to age or medical condition  Airway Management Planned: Oral ETT  Additional Equipment:   Intra-op Plan:   Post-operative Plan: Extubation in OR  Informed Consent: I have reviewed the patients History and Physical, chart, labs and discussed the procedure including the risks, benefits and alternatives for the proposed anesthesia with the patient or authorized representative who has indicated his/her understanding and acceptance.     Dental advisory given  Plan Discussed with: CRNA, Anesthesiologist and Surgeon  Anesthesia Plan Comments: (PAT note written 03/01/2022 by Myra Gianotti, PA-C.  )       Anesthesia Quick Evaluation

## 2022-03-02 ENCOUNTER — Encounter (HOSPITAL_COMMUNITY)
Admission: RE | Disposition: A | Discharge status: Home / Self Care | Payer: Self-pay | Source: Home / Self Care | Attending: Pulmonary Disease

## 2022-03-02 ENCOUNTER — Ambulatory Visit (HOSPITAL_COMMUNITY)
Admission: RE | Admit: 2022-03-02 | Discharge: 2022-03-02 | Disposition: A | Discharge status: Home / Self Care | Payer: Medicare HMO | Attending: Pulmonary Disease | Admitting: Pulmonary Disease

## 2022-03-02 ENCOUNTER — Ambulatory Visit (HOSPITAL_COMMUNITY): Payer: Medicare HMO

## 2022-03-02 ENCOUNTER — Ambulatory Visit (HOSPITAL_BASED_OUTPATIENT_CLINIC_OR_DEPARTMENT_OTHER): Payer: Medicare HMO | Admitting: Vascular Surgery

## 2022-03-02 ENCOUNTER — Encounter (HOSPITAL_COMMUNITY): Payer: Self-pay | Admitting: Pulmonary Disease

## 2022-03-02 ENCOUNTER — Ambulatory Visit (HOSPITAL_COMMUNITY): Payer: Medicare HMO | Admitting: Vascular Surgery

## 2022-03-02 DIAGNOSIS — J449 Chronic obstructive pulmonary disease, unspecified: Secondary | ICD-10-CM

## 2022-03-02 DIAGNOSIS — E119 Type 2 diabetes mellitus without complications: Secondary | ICD-10-CM

## 2022-03-02 DIAGNOSIS — Z87891 Personal history of nicotine dependence: Secondary | ICD-10-CM | POA: Diagnosis not present

## 2022-03-02 DIAGNOSIS — I129 Hypertensive chronic kidney disease with stage 1 through stage 4 chronic kidney disease, or unspecified chronic kidney disease: Secondary | ICD-10-CM | POA: Diagnosis not present

## 2022-03-02 DIAGNOSIS — Z09 Encounter for follow-up examination after completed treatment for conditions other than malignant neoplasm: Secondary | ICD-10-CM | POA: Diagnosis not present

## 2022-03-02 DIAGNOSIS — R911 Solitary pulmonary nodule: Secondary | ICD-10-CM | POA: Diagnosis present

## 2022-03-02 DIAGNOSIS — Z794 Long term (current) use of insulin: Secondary | ICD-10-CM | POA: Insufficient documentation

## 2022-03-02 DIAGNOSIS — N189 Chronic kidney disease, unspecified: Secondary | ICD-10-CM | POA: Insufficient documentation

## 2022-03-02 DIAGNOSIS — E1122 Type 2 diabetes mellitus with diabetic chronic kidney disease: Secondary | ICD-10-CM | POA: Insufficient documentation

## 2022-03-02 DIAGNOSIS — J4489 Other specified chronic obstructive pulmonary disease: Secondary | ICD-10-CM | POA: Insufficient documentation

## 2022-03-02 DIAGNOSIS — Z7984 Long term (current) use of oral hypoglycemic drugs: Secondary | ICD-10-CM | POA: Insufficient documentation

## 2022-03-02 DIAGNOSIS — I10 Essential (primary) hypertension: Secondary | ICD-10-CM

## 2022-03-02 DIAGNOSIS — Z1152 Encounter for screening for COVID-19: Secondary | ICD-10-CM | POA: Diagnosis not present

## 2022-03-02 HISTORY — PX: BRONCHIAL BRUSHINGS: SHX5108

## 2022-03-02 HISTORY — PX: BRONCHIAL BIOPSY: SHX5109

## 2022-03-02 HISTORY — PX: BRONCHIAL NEEDLE ASPIRATION BIOPSY: SHX5106

## 2022-03-02 HISTORY — PX: FIDUCIAL MARKER PLACEMENT: SHX6858

## 2022-03-02 HISTORY — DX: Cardiac murmur, unspecified: R01.1

## 2022-03-02 HISTORY — DX: Chronic obstructive pulmonary disease, unspecified: J44.9

## 2022-03-02 LAB — BASIC METABOLIC PANEL
Anion gap: 11 (ref 5–15)
BUN: 24 mg/dL — ABNORMAL HIGH (ref 8–23)
CO2: 25 mmol/L (ref 22–32)
Calcium: 9.7 mg/dL (ref 8.9–10.3)
Chloride: 100 mmol/L (ref 98–111)
Creatinine, Ser: 1.34 mg/dL — ABNORMAL HIGH (ref 0.44–1.00)
GFR, Estimated: 39 mL/min — ABNORMAL LOW (ref >60)
Glucose, Bld: 107 mg/dL — ABNORMAL HIGH (ref 70–99)
Potassium: 4.8 mmol/L (ref 3.5–5.1)
Sodium: 136 mmol/L (ref 135–145)

## 2022-03-02 LAB — GLUCOSE, CAPILLARY
Glucose-Capillary: 117 mg/dL — ABNORMAL HIGH (ref 70–99)
Glucose-Capillary: 112 mg/dL — ABNORMAL HIGH (ref 70–99)
Glucose-Capillary: 102 mg/dL — ABNORMAL HIGH (ref 70–99)

## 2022-03-02 LAB — CBC
HCT: 39 % (ref 36.0–46.0)
Hemoglobin: 12.8 g/dL (ref 12.0–15.0)
MCH: 28.2 pg (ref 26.0–34.0)
MCHC: 32.8 g/dL (ref 30.0–36.0)
MCV: 85.9 fL (ref 80.0–100.0)
Platelets: 196 10*3/uL (ref 150–400)
RBC: 4.54 MIL/uL (ref 3.87–5.11)
RDW: 14.8 % (ref 11.5–15.5)
WBC: 11.5 10*3/uL — ABNORMAL HIGH (ref 4.0–10.5)
nRBC: 0 % (ref 0.0–0.2)

## 2022-03-02 LAB — SARS CORONAVIRUS 2 BY RT PCR: SARS Coronavirus 2 by RT PCR: NEGATIVE

## 2022-03-02 SURGERY — BRONCHOSCOPY, WITH BIOPSY USING ELECTROMAGNETIC NAVIGATION
Anesthesia: General | Laterality: Right

## 2022-03-02 MED ORDER — ALBUTEROL SULFATE (2.5 MG/3ML) 0.083% IN NEBU
2.5000 mg | INHALATION_SOLUTION | Freq: Once | RESPIRATORY_TRACT | Status: AC
  Administered 2022-03-02: 2.5 mg via RESPIRATORY_TRACT

## 2022-03-02 MED ORDER — ALBUTEROL SULFATE (2.5 MG/3ML) 0.083% IN NEBU
INHALATION_SOLUTION | RESPIRATORY_TRACT | Status: AC
  Filled 2022-03-02: qty 3

## 2022-03-02 MED ORDER — OXYCODONE HCL 5 MG PO TABS: 5.0000 mg | ORAL_TABLET | Freq: Once | ORAL | Status: DC | PRN

## 2022-03-02 MED ORDER — PROPOFOL 500 MG/50ML IV EMUL
INTRAVENOUS | Status: DC | PRN
  Administered 2022-03-02: 125 ug/kg/min via INTRAVENOUS

## 2022-03-02 MED ORDER — INSULIN ASPART 100 UNIT/ML IJ SOLN: 0.0000 [IU] | INTRAMUSCULAR | Status: DC | PRN

## 2022-03-02 MED ORDER — EPHEDRINE SULFATE-NACL 50-0.9 MG/10ML-% IV SOSY
PREFILLED_SYRINGE | INTRAVENOUS | Status: DC | PRN
  Administered 2022-03-02: 10 mg via INTRAVENOUS
  Administered 2022-03-02: 5 mg via INTRAVENOUS

## 2022-03-02 MED ORDER — PROPOFOL 10 MG/ML IV BOLUS
INTRAVENOUS | Status: DC | PRN
  Administered 2022-03-02: 110 mg via INTRAVENOUS

## 2022-03-02 MED ORDER — ONDANSETRON HCL 4 MG/2ML IJ SOLN
INTRAMUSCULAR | Status: DC | PRN
  Administered 2022-03-02: 4 mg via INTRAVENOUS

## 2022-03-02 MED ORDER — PHENYLEPHRINE 80 MCG/ML (10ML) SYRINGE FOR IV PUSH (FOR BLOOD PRESSURE SUPPORT)
PREFILLED_SYRINGE | INTRAVENOUS | Status: DC | PRN
  Administered 2022-03-02: 160 ug via INTRAVENOUS

## 2022-03-02 MED ORDER — FENTANYL CITRATE (PF) 100 MCG/2ML IJ SOLN
INTRAMUSCULAR | Status: DC | PRN
  Administered 2022-03-02: 50 ug via INTRAVENOUS

## 2022-03-02 MED ORDER — FENTANYL CITRATE (PF) 100 MCG/2ML IJ SOLN: 25.0000 ug | INTRAMUSCULAR | Status: DC | PRN

## 2022-03-02 MED ORDER — LACTATED RINGERS IV SOLN: INTRAVENOUS | Status: DC

## 2022-03-02 MED ORDER — ROCURONIUM BROMIDE 10 MG/ML (PF) SYRINGE
PREFILLED_SYRINGE | INTRAVENOUS | Status: DC | PRN
  Administered 2022-03-02: 60 mg via INTRAVENOUS

## 2022-03-02 MED ORDER — SUGAMMADEX SODIUM 200 MG/2ML IV SOLN
INTRAVENOUS | Status: DC | PRN
  Administered 2022-03-02: 200 mg via INTRAVENOUS

## 2022-03-02 MED ORDER — LIDOCAINE 2% (20 MG/ML) 5 ML SYRINGE
INTRAMUSCULAR | Status: DC | PRN
  Administered 2022-03-02: 60 mg via INTRAVENOUS

## 2022-03-02 MED ORDER — ONDANSETRON HCL 4 MG/2ML IJ SOLN: 4.0000 mg | Freq: Four times a day (QID) | INTRAMUSCULAR | Status: DC | PRN

## 2022-03-02 MED ORDER — DEXAMETHASONE SODIUM PHOSPHATE 10 MG/ML IJ SOLN
INTRAMUSCULAR | Status: DC | PRN
  Administered 2022-03-02: 4 mg via INTRAVENOUS

## 2022-03-02 MED ORDER — CHLORHEXIDINE GLUCONATE 0.12 % MT SOLN
OROMUCOSAL | Status: AC
  Administered 2022-03-02: 15 mL
  Filled 2022-03-02: qty 15

## 2022-03-02 MED ORDER — OXYCODONE HCL 5 MG/5ML PO SOLN: 5.0000 mg | Freq: Once | ORAL | Status: DC | PRN

## 2022-03-02 SURGICAL SUPPLY — 1 items: superlock fiducial marker IMPLANT

## 2022-03-02 NOTE — Anesthesia Postprocedure Evaluation (Signed)
Anesthesia Post Note  Patient: JANNICE BEITZEL  Procedure(s) Performed: ROBOTIC ASSISTED NAVIGATIONAL BRONCHOSCOPY (Right) BRONCHIAL BIOPSIES BRONCHIAL BRUSHINGS BRONCHIAL NEEDLE ASPIRATION BIOPSIES FIDUCIAL MARKER PLACEMENT     Patient location during evaluation: PACU Anesthesia Type: General Level of consciousness: awake and alert Pain management: pain level controlled Vital Signs Assessment: post-procedure vital signs reviewed and stable Respiratory status: spontaneous breathing, nonlabored ventilation, respiratory function stable and patient connected to nasal cannula oxygen Cardiovascular status: blood pressure returned to baseline and stable Postop Assessment: no apparent nausea or vomiting Anesthetic complications: no   No notable events documented.  Last Vitals:  Vitals:   03/02/22 1200 03/02/22 1215  BP: (!) 173/88 (!) 166/56  Pulse: 83 73  Resp: (!) 23 19  Temp:  (!) 36.3 C  SpO2: 92% 100%    Last Pain:  Vitals:   03/02/22 1215  TempSrc:   PainSc: 0-No pain                 Braydn Carneiro S

## 2022-03-02 NOTE — Discharge Instructions (Signed)

## 2022-03-02 NOTE — Transfer of Care (Signed)
Immediate Anesthesia Transfer of Care Note  Patient: Stephanie [name]  Procedure(s) Performed: ROBOTIC ASSISTED NAVIGATIONAL BRONCHOSCOPY (Right) BRONCHIAL BIOPSIES BRONCHIAL BRUSHINGS BRONCHIAL NEEDLE ASPIRATION BIOPSIES FIDUCIAL MARKER PLACEMENT  Patient Location: PACU  Anesthesia Type:General  Level of Consciousness: awake, alert , and oriented  Airway & Oxygen Therapy: Patient Spontanous Breathing and Patient connected to face mask oxygen  Post-op Assessment: Report given to RN and Post -op Vital signs reviewed and stable  Post vital signs: Reviewed and stable  Last Vitals:  Vitals Value Taken Time  BP 153/115 03/02/22 1152  Temp    Pulse 83 03/02/22 1156  Resp 25 03/02/22 1156  SpO2 95 % 03/02/22 1156  Vitals shown include unvalidated device data.  Last Pain:  Vitals:   03/02/22 0829  TempSrc:   PainSc: 0-No pain         Complications: No notable events documented.

## 2022-03-02 NOTE — Op Note (Addendum)
Video Bronchoscopy with Robotic Assisted Bronchoscopic Navigation   Date of Operation: 03/02/2022   Pre-op Diagnosis: Right lower lobe nodule  Post-op Diagnosis: Right lower lobe nodule  Surgeon: Garner Nash, DO  Assistants: None   Anesthesia: General endotracheal anesthesia  Operation: Flexible video fiberoptic bronchoscopy with robotic assistance and biopsies.  Estimated Blood Loss: Minimal  Complications: None  Indications and History: Stephanie [name] is a 87 y.o. female with history of right lower lobe nodule. The risks, benefits, complications, treatment options and expected outcomes were discussed with the patient.  The possibilities of pneumothorax, pneumonia, reaction to medication, pulmonary aspiration, perforation of a viscus, bleeding, failure to diagnose a condition and creating a complication requiring transfusion or operation were discussed with the patient who freely signed the consent.    Description of Procedure: The patient was seen in the Preoperative Area, was examined and was deemed appropriate to proceed.  The patient was taken to [facility] endoscopy room 3, identified as Stephanie [name] and the procedure verified as Flexible Video Fiberoptic Bronchoscopy.  A Time Out was held and the above information confirmed.   Prior to the date of the procedure a high-resolution CT scan of the chest was performed. Utilizing ION software program a virtual tracheobronchial tree was generated to allow the creation of distinct navigation pathways to the patient's parenchymal abnormalities. After being taken to the operating room general anesthesia was initiated and the patient  was orally intubated. The video fiberoptic bronchoscope was introduced via the endotracheal tube and a general inspection was performed which showed normal right and left lung anatomy, aspiration of the bilateral mainstems was completed to remove any remaining secretions. Robotic catheter inserted into  patient's endotracheal tube.   Target #1 right lower lobe nodule: The distinct navigation pathways prepared prior to this procedure were then utilized to navigate to patient's lesion identified on CT scan. The robotic catheter was secured into place and the vision probe was withdrawn.  Lesion location was approximated using fluoroscopy and three-dimensional cone beam CT imaging for CT-guided needle placement and peripheral targeting. Under fluoroscopic guidance transbronchial needle brushings, transbronchial needle biopsies, and transbronchial forceps biopsies were performed to be sent for cytology and pathology.  Following tissue sampling a single fiducial was placed using a fiducial catheter wire and delivery kit under direct fluoroscopy.  At the end of the procedure a general airway inspection was performed and there was no evidence of active bleeding. The bronchoscope was removed.  The patient tolerated the procedure well. There was no significant blood loss and there were no obvious complications. A post-procedural chest x-ray is pending.  Samples Target #1: 1. Transbronchial needle brushings from right lower lung 2. Transbronchial Wang needle biopsies from right lower lobe 3. Transbronchial forceps biopsies from right lower lobe  Plans:  The patient will be discharged from the PACU to home when recovered from anesthesia and after chest x-ray is reviewed. We will review the cytology, pathology results with the patient when they become available. Outpatient followup will be with Garner Nash, DO.  Garner Nash, DO [facility] 03/02/2022 11:37 AM

## 2022-03-02 NOTE — Interval H&P Note (Signed)
History and Physical Interval Note:  03/02/2022 10:28 AM  Stephanie [name]  has presented today for surgery, with the diagnosis of lung nodule.  The various methods of treatment have been discussed with the patient and family. After consideration of risks, benefits and other options for treatment, the patient has consented to  Procedure(s) with comments: ROBOTIC ASSISTED NAVIGATIONAL BRONCHOSCOPY (Right) - ION w/ CIOS as a surgical intervention.  The patient's history has been reviewed, patient examined, no change in status, stable for surgery.  I have reviewed the patient's chart and labs.  Questions were answered to the patient's satisfaction.     [place]

## 2022-03-02 NOTE — Anesthesia Procedure Notes (Signed)
Procedure Name: Intubation Date/Time: 03/02/2022 10:57 AM  Performed by: Genelle Bal, CRNAPre-anesthesia Checklist: Patient identified, Emergency Drugs available, Suction available and Patient being monitored Patient Re-evaluated:Patient Re-evaluated prior to induction Oxygen Delivery Method: Circle system utilized Preoxygenation: Pre-oxygenation with 100% oxygen Induction Type: IV induction Ventilation: Mask ventilation without difficulty Laryngoscope Size: Miller and 2 Grade View: Grade I Tube type: Oral Tube size: 8.5 mm Number of attempts: 1 Airway Equipment and Method: Stylet and Oral airway Placement Confirmation: ETT inserted through vocal cords under direct vision, positive ETCO2 and breath sounds checked- equal and bilateral Secured at: 22 cm Tube secured with: Tape Dental Injury: Teeth and Oropharynx as per pre-operative assessment

## 2022-03-03 ENCOUNTER — Encounter (HOSPITAL_COMMUNITY): Payer: Self-pay | Admitting: Pulmonary Disease

## 2022-03-04 LAB — CYTOLOGY - NON PAP

## 2022-03-04 NOTE — Progress Notes (Signed)
Tried calling the pt Line still busy Pt is scheduled for rov with TP 03/09/22 and can discuss results at that time

## 2022-03-08 NOTE — Progress Notes (Signed)
Stephanie [name], she is seeing you tomorrow.  I suspect this patient is still malignancy.  It is reasonable to refer her to radiation oncology for treatments.  They called positivity in the room but read the final report has atypical cells.  Thanks,  [name], DO [facility] 03/08/2022 4:41 PM

## 2022-03-09 ENCOUNTER — Encounter: Payer: Self-pay | Admitting: Adult Health

## 2022-03-09 ENCOUNTER — Ambulatory Visit (INDEPENDENT_AMBULATORY_CARE_PROVIDER_SITE_OTHER): Payer: Medicare HMO | Admitting: Adult Health

## 2022-03-09 ENCOUNTER — Telehealth: Payer: Self-pay | Admitting: Radiation Oncology

## 2022-03-09 VITALS — BP 126/50 | HR 58 | Temp 98.0°F | Ht 65.0 in | Wt 188.6 lb

## 2022-03-09 DIAGNOSIS — J4530 Mild persistent asthma, uncomplicated: Secondary | ICD-10-CM | POA: Diagnosis not present

## 2022-03-09 DIAGNOSIS — R911 Solitary pulmonary nodule: Secondary | ICD-10-CM

## 2022-03-09 NOTE — Progress Notes (Signed)
No flu or Covid vaccines.

## 2022-03-09 NOTE — Assessment & Plan Note (Addendum)
Slow-growing right-sided lung nodule present since 2015.  Previous PET scan shows some low-grade metabolic activity.  Patient underwent navigational bronchoscopy.  Cytology showed atypical cells.  Case discussed with Dr. Valeta Harms with suspicion for underlying malignancy with recommendations for referral to radiation oncology to consider SBRT We discussed results in detail along with treatment recommendations   Plan  Patient Instructions  Refer to Radiation Oncology .  Albuterol inhaler As needed   Follow up with Dr. [name]  as planned .

## 2022-03-09 NOTE — Telephone Encounter (Signed)
LVM to schedule CON with Dr. Lisbeth Renshaw

## 2022-03-09 NOTE — Patient Instructions (Addendum)
Refer to Radiation Oncology .  Albuterol inhaler As needed   Follow up with Dr. [name]  as planned .

## 2022-03-09 NOTE — Telephone Encounter (Signed)
Spoke with pt's family member who advised it would be best to call tomorrow late morning to schedule appt with pt.

## 2022-03-09 NOTE — Assessment & Plan Note (Signed)
Asthma appears to be stable.  With moderate to severe restriction and obstruction.  However patient has had no perceived benefit with inhalers in the past.  Continue to use albuterol as needed.  Patient is quite sedentary.  Plan  Patient Instructions  Refer to Radiation Oncology .  Albuterol inhaler As needed   Follow up with Dr. [name]  as planned .

## 2022-03-09 NOTE — Progress Notes (Signed)
@Patient  ID: Stephanie [name], female    DOB: 1935/08/11, 87 y.o.   MRN: [id number]  Chief Complaint  Patient presents with   Follow-up    Referring provider: [facility]  HPI: 87 year old female former smoker followed for nodules and asthma History of congestive heart failure, diabetes, hypertension, aortic valve stenosis status post TAVR  TEST/EVENTS :  CT chest October 30, 2021 showed biapical scarring, 1.7 x 1.0 ill-defined nodule in the right lower lobe with slightly increased size previously measured 1.6 x 0.8.  This area is slowly grown since 2015.  No pathologically enlarged mediastinal or axillary lymph nodes  PET scan on May 12, 2020 showed low metabolic activity in the right upper lobe nodule SUV max 1.5   CT chest November 04, 2013 8 x 7 irregular nodule right middle lobe  PFTs show moderate to severe restriction and obstruction with an FEV1 at 47%, ratio 70, FVC 50%, no significant bronchodilator response, DLCO is 115%.  03/09/2022 Follow up ; Lung nodule, Bronch results and Asthma  Patient returns for 1 month follow-up.  Patient has been followed for a right-sided lung nodule that has had slow growth since 2015.  PET scan April 2022 showed low metabolic activity with SUV max at 1.5.  Serial CT chest October 30, 2021 showed a 1.7 x 1.0 cm ill-defined nodule in the right lower lobe that has slightly increased in size.  She was referred for navigational bronchoscopy that was done on March 02, 2022.  Cytology returned with atypical cells present.  Results were discussed in detail with patient and family member.  PFTs last visit showed moderate to severe restriction and obstruction with an FEV1 at 47% and a ratio of 70 with FVC is 50%.  DLCO was 115%.  Patient has a known history of asthma.  She says she has tried inhalers in the past but had no perceived benefit.  Uses albuterol usually a few times a week.  Lives at home. Daughter helps with care. Does not drive  .  She has never been on oxygen. No increased albuterol use. Does ADLs. Sedentary , limited by back pain. Unable to walk long distance. Does small house chores.   Allergies  Allergen Reactions   Amoxicillin Swelling    Tongue swelling    Penicillins Swelling    Has patient had a PCN reaction causing immediate rash, facial/tongue/throat swelling, SOB or lightheadedness with hypotension: No Has patient had a PCN reaction causing severe rash involving mucus membranes or skin necrosis: No Has patient had a PCN reaction that required hospitalization: No Has patient had a PCN reaction occurring within the last 10 years: No If all of the above answers are "NO", then may proceed with Cephalosporin use.    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 Burt Knack and Dr. Cyndia Bent.   Severe aortic stenosis    Symptomatic anemia  Presumed GI bleed January 2021 - she declined GI work-up   Type 2 diabetes mellitus ([place])     Tobacco History: Social History   Tobacco Use  Smoking Status Former   Packs/day: 1.00   Years: 7.00   Total pack years: 7.00   Types: Cigarettes  Smokeless Tobacco Never  Tobacco Comments   Quit over 23 years ago   Counseling given: Not Answered Tobacco comments: Quit over 23 years ago   Outpatient Medications Prior to Visit  Medication Sig Dispense Refill   albuterol (PROVENTIL) (2.5 MG/3ML) 0.083% nebulizer solution Take 2.5 mg by  nebulization every 6 (six) hours as needed for wheezing or shortness of breath.   2   carvedilol (COREG) 6.25 MG tablet Take 1 tablet (6.25 mg total) by mouth 2 (two) times daily. 180 tablet 3   ferrous sulfate 325 (65 FE) MG EC tablet Take 325 mg by mouth daily with breakfast.     furosemide (LASIX) 40 MG tablet Take 40 mg by mouth daily.     gabapentin (NEURONTIN) 100 MG capsule Take 100-200 mg by mouth at bedtime.     Insulin Detemir (LEVEMIR FLEXTOUCH) 100 UNIT/ML Pen Inject 30 Units into the skin at bedtime. (Patient taking differently: Inject 40 Units into the skin at bedtime.) 15 mL 11   metFORMIN (GLUCOPHAGE) 500 MG tablet Take 1 tablet (500 mg total) by mouth 2 (two) times daily.     neomycin-polymyxin-hydrocortisone (CORTISPORIN) 3.5-10000-1 OTIC suspension Place 4 drops into the left ear 3 (three) times daily.     apixaban (ELIQUIS) 5 MG TABS tablet Take 1 tablet (5 mg total) by mouth 2 (two) times daily. (Patient not taking: Reported on 02/26/2022) 120 tablet 0   No facility-administered medications prior to visit.     Review of Systems:   Constitutional:   No  weight loss, night sweats,  Fevers, chills,  +fatigue, or  lassitude.  HEENT:   No headaches,  Difficulty swallowing,  Tooth/dental problems, or  Sore throat,                No sneezing, itching, ear ache, nasal congestion, post nasal drip,   CV:  No chest pain,  Orthopnea, PND, swelling in lower extremities, anasarca, dizziness, palpitations, syncope.   GI  No heartburn, indigestion, abdominal pain, nausea, vomiting, diarrhea, change in bowel habits, loss of appetite, bloody stools.   Resp: No shortness of breath with exertion or at rest.  No excess mucus, no productive cough,  No non-productive cough,  No coughing up of blood.  No change in color of mucus.  No wheezing.  No chest wall deformity  Skin: no rash or lesions.  GU: no dysuria, change in color of urine, no urgency or frequency.  No flank pain, no hematuria    MS:  No joint pain or swelling.  No decreased range of motion.  No back pain.    Physical Exam  BP (!) 126/50 (BP Location: Right Arm, Patient Position: Sitting, Cuff Size: Normal)   Pulse (!) 58   Temp 98 F (36.7 C) (Oral)   Ht 5\' 5"  (1.651 m)   Wt 188 lb 9.6 oz (85.5 kg)   SpO2 100%   BMI 31.38 kg/m   GEN: A/Ox3; pleasant , NAD, well nourished , wc    HEENT:  [place]/AT,  NOSE-clear, THROAT-clear, no lesions, no postnasal drip or exudate noted.   NECK:  Supple w/ fair ROM; no JVD; normal carotid impulses w/o bruits; no thyromegaly or  nodules palpated; no lymphadenopathy.    RESP  Clear  P & A; w/o, wheezes/ rales/ or rhonchi. no accessory muscle use, no dullness to percussion  CARD:  RRR, no m/r/g, no peripheral edema, pulses intact, no cyanosis or clubbing.  GI:   Soft & nt; nml bowel sounds; no organomegaly or masses detected.   Musco: Warm bil, no deformities or joint swelling noted.   Neuro: alert, no focal deficits noted.    Skin: Warm, no lesions or rashes    Lab Results:  CBC    Component Value Date/Time   WBC 11.5 (H) 03/02/2022 0859   RBC 4.54 03/02/2022 0859   HGB 12.8 03/02/2022 0859   HGB 12.3 07/20/2021 1324   HCT 39.0 03/02/2022 0859   HCT 38.5 07/20/2021 1324   PLT 196 03/02/2022 0859   PLT 232 07/20/2021 1324   MCV 85.9 03/02/2022 0859   MCV 85 07/20/2021 1324   MCH 28.2 03/02/2022 0859   MCHC 32.8 03/02/2022 0859   RDW 14.8 03/02/2022 0859   RDW 14.9 07/20/2021 1324   LYMPHSABS 1.4 10/13/2021 1808   MONOABS 0.8 10/13/2021 1808   EOSABS 0.4 10/13/2021 1808   BASOSABS 0.0 10/13/2021 1808    BMET    Component Value Date/Time   NA 136 03/02/2022 0859   NA 139 07/20/2021 1324   K 4.8 03/02/2022 0859   CL 100 03/02/2022 0859   CO2 25 03/02/2022 0859   GLUCOSE 107 (H) 03/02/2022 0859   BUN 24 (H) 03/02/2022 0859   BUN 24 07/20/2021 1324   CREATININE 1.34 (H) 03/02/2022 0859   CALCIUM 9.7 03/02/2022 0859   GFRNONAA 39 (L) 03/02/2022  0859   GFRAA 49 (L) 02/10/2019 0610    BNP    Component Value Date/Time   BNP 267.0 (H) 10/13/2021 1808    ProBNP    Component Value Date/Time   PROBNP 258.2 11/29/2013 2039    Imaging: DG Chest Port 1 View  Result Date: 03/02/2022 CLINICAL DATA:  Status post bronchoscopy with biopsy. EXAM: PORTABLE CHEST 1 VIEW COMPARISON:  Chest CT dated 02/12/2022 and chest radiograph dated 10/13/2021. FINDINGS: The heart is enlarged. Vascular calcifications are seen in the aortic arch. A fiducial marker is seen in the right lower lung, likely reflecting the site of biopsy. Associated opacity likely reflects the previously described subsolid right lower lobe pulmonary nodule. There is mild right basilar atelectasis. There is suggestion of a small left pleural effusion with associated atelectasis/airspace disease. No pneumothorax is identified. Degenerative changes are seen in the spine. IMPRESSION: 1. A fiducial marker is seen in the right lower lung, likely reflecting the site of biopsy. Associated opacity likely reflects the previously described subsolid right lower lobe pulmonary nodule. 2. Small left pleural effusion with associated atelectasis/airspace disease. Electronically Signed   By: Zerita Boers M.D.   On: 03/02/2022 12:09   DG C-ARM BRONCHOSCOPY  Result Date: 03/02/2022 C-ARM BRONCHOSCOPY: Fluoroscopy was utilized by the requesting physician.  No radiographic interpretation.   CT Super D Chest Wo Contrast  Result Date: 02/15/2022 CLINICAL DATA:  Lung nodule. EXAM: CT CHEST WITHOUT CONTRAST TECHNIQUE: Multidetector CT imaging of the chest was performed using thin slice collimation for electromagnetic bronchoscopy planning purposes, without intravenous contrast. RADIATION DOSE REDUCTION: This exam was performed according to the departmental dose-optimization program which includes automated exposure control, adjustment of the mA and/or kV according to patient size and/or use of iterative  reconstruction technique. COMPARISON:  10/30/2021, 03/05/2021, 11/19/2020, 04/25/2020 and 11/04/2013. FINDINGS:  Cardiovascular: Atherosclerotic calcification of the aorta and coronary arteries. Aortic valve replacement. Enlarged pulmonic trunk and heart. No pericardial effusion. Mediastinum/Nodes: No pathologically enlarged mediastinal or axillary lymph nodes. Hilar regions are difficult to definitively evaluate without IV contrast. Esophagus is grossly unremarkable. Lungs/Pleura: Image quality is somewhat degraded by expiratory phase imaging. Biapical pleuroparenchymal scarring. Mild paraseptal emphysema. Subsolid nodule in the anterior right lower lobe overall measures 1.1 x 2.5 cm (8/84) with an internal 0.9 x 1.6 cm solid component, similar dating back to 31/54/0086 and hypometabolic on 76/19/5093. However, lesion has enlarged from remote prior exam of 11/04/2013, at which time it measured overall 1.0 x 1.5 cm with an internal 6 mm nodular component. Minimal scarring in the lingula and right middle lobe. No pleural fluid. Airway is unremarkable. Upper Abdomen: Probable liver cysts, measuring up to 2.8 cm in the left hepatic lobe. Visualized portions of the liver, gallbladder and right adrenal gland are otherwise unremarkable. 2.3 cm nodule in the left adrenal gland measures 13 Hounsfield units. No follow-up necessary. Partially imaged 2.8 cm low-attenuation lesion in the right kidney. No specific follow-up necessary. 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 Visualized portions of the left kidney, spleen, pancreas, stomach and bowel are otherwise unremarkable. No upper abdominal adenopathy. Musculoskeletal: Degenerative changes in the spine. No worrisome lytic or sclerotic lesions. IMPRESSION: 1. Subsolid right lower lobe nodule, stable from 04/25/2020 but enlarged from 11/04/2013. Lesion remains worrisome for indolent adenocarcinoma. 2. Left adrenal adenoma. 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 4. Aortic atherosclerosis (ICD10-I70.0). Coronary artery calcification. 5. Enlarged pulmonic trunk, indicative of pulmonary arterial hypertension. 6.  Emphysema (ICD10-J43.9). Electronically Signed   By: Lorin Picket M.D.   On: 02/15/2022 10:11         Latest Ref Rng & Units 02/05/2022    2:45 PM  PFT Results  FVC-Pre L 1.24   FVC-Predicted Pre % 48   FVC-Post L 1.30   FVC-Predicted Post % 50   Pre FEV1/FVC % % 71   Post FEV1/FCV % % 70   FEV1-Pre L 0.87   FEV1-Predicted Pre % 46   FEV1-Post L 0.91   DLCO uncorrected ml/min/mmHg 22.14   DLCO UNC% % 115   DLCO corrected ml/min/mmHg 22.14   DLCO COR %Predicted % 115   DLVA Predicted % 117   TLC L 4.16   TLC % Predicted % 79   RV % Predicted % 102     No results found for: "NITRICOXIDE"      Assessment & Plan:   Asthma Asthma appears to be stable.  With moderate to severe restriction and obstruction.  However patient has had no perceived benefit with inhalers in the past.  Continue to use albuterol as needed.  Patient is quite sedentary.  Plan  Patient Instructions  Refer to Radiation Oncology .  Albuterol inhaler As needed   Follow up with Dr. [name]  as planned .        Solitary pulmonary nodule on lung CT Slow-growing right-sided lung nodule present since 2015.  Previous PET scan shows some low-grade metabolic activity.  Patient underwent navigational bronchoscopy.  Cytology showed atypical cells.  Case discussed with Dr. Valeta Harms with suspicion for underlying malignancy with recommendations for referral to radiation oncology to consider SBRT We discussed results in detail along with treatment recommendations   Plan  Patient Instructions  Refer to Radiation Oncology .  Albuterol inhaler As needed   Follow up with Dr. [name]  as planned .  Rexene Edison, NP 03/09/2022

## 2022-03-11 NOTE — Progress Notes (Signed)
Thoracic Location of Tumor / Histology: Right Lung nodule  Patient has been followed by Pulmonary for lung nodules and asthma.  Bronch 03/02/2022:  CT Chest 10/30/2021: Bi-apical scarring, 1.7 x 1.0 ill defined nodule in the right lower lobe with slightly increased size previously measured 1.6 x 0.8.   Biopsies of Right Lower Lobe Lung 03/02/2022    Tobacco/Marijuana/Snuff/ETOH use: Former Smoker   Past/Anticipated interventions by pulmonary, if any: T. Parrett NP 03/09/2022 -Slow-growing right-sided lung nodule present since 2015.  -Case discussed with Dr. Valeta Harms with suspicion for underlying malignancy with recommendations for referral to radiation oncology to consider SBRT.    Past/Anticipated interventions by cardiothoracic surgery, if any:    Past/Anticipated interventions by medical oncology, if any:   Signs/Symptoms Weight changes, if any: She has lost about 12 pounds over the past month or so. Respiratory complaints, if any: She reports SOB with increased activities. Hemoptysis, if any: She reports occasional non-productive cough.  Denies hemoptysis. Pain issues, if any:  Denies chest pain, pressure, or tightness.  SAFETY ISSUES: Prior radiation? No Pacemaker/ICD?  No Possible current pregnancy? Postmenopausal Is the patient on methotrexate? No  Current Complaints / other details:

## 2022-03-12 ENCOUNTER — Other Ambulatory Visit: Payer: Self-pay

## 2022-03-12 ENCOUNTER — Ambulatory Visit
Admission: RE | Admit: 2022-03-12 | Discharge: 2022-03-12 | Disposition: A | Discharge status: Home / Self Care | Payer: Medicare HMO | Source: Ambulatory Visit | Attending: Radiation Oncology | Admitting: Radiation Oncology

## 2022-03-12 ENCOUNTER — Encounter: Payer: Self-pay | Admitting: Radiation Oncology

## 2022-03-12 VITALS — BP 160/72 | HR 64 | Temp 97.8°F | Resp 20 | Ht 65.0 in | Wt 186.2 lb

## 2022-03-12 DIAGNOSIS — M129 Arthropathy, unspecified: Secondary | ICD-10-CM | POA: Insufficient documentation

## 2022-03-12 DIAGNOSIS — J439 Emphysema, unspecified: Secondary | ICD-10-CM | POA: Diagnosis not present

## 2022-03-12 DIAGNOSIS — D3502 Benign neoplasm of left adrenal gland: Secondary | ICD-10-CM | POA: Diagnosis not present

## 2022-03-12 DIAGNOSIS — C3431 Malignant neoplasm of lower lobe, right bronchus or lung: Secondary | ICD-10-CM

## 2022-03-12 DIAGNOSIS — E785 Hyperlipidemia, unspecified: Secondary | ICD-10-CM | POA: Diagnosis not present

## 2022-03-12 DIAGNOSIS — N189 Chronic kidney disease, unspecified: Secondary | ICD-10-CM | POA: Insufficient documentation

## 2022-03-12 DIAGNOSIS — J449 Chronic obstructive pulmonary disease, unspecified: Secondary | ICD-10-CM | POA: Diagnosis not present

## 2022-03-12 DIAGNOSIS — Z79899 Other long term (current) drug therapy: Secondary | ICD-10-CM | POA: Insufficient documentation

## 2022-03-12 DIAGNOSIS — J4489 Other specified chronic obstructive pulmonary disease: Secondary | ICD-10-CM | POA: Insufficient documentation

## 2022-03-12 DIAGNOSIS — E119 Type 2 diabetes mellitus without complications: Secondary | ICD-10-CM | POA: Diagnosis not present

## 2022-03-12 DIAGNOSIS — I4891 Unspecified atrial fibrillation: Secondary | ICD-10-CM | POA: Insufficient documentation

## 2022-03-12 DIAGNOSIS — I700 Atherosclerosis of aorta: Secondary | ICD-10-CM | POA: Insufficient documentation

## 2022-03-12 DIAGNOSIS — Z7901 Long term (current) use of anticoagulants: Secondary | ICD-10-CM | POA: Insufficient documentation

## 2022-03-12 DIAGNOSIS — J90 Pleural effusion, not elsewhere classified: Secondary | ICD-10-CM | POA: Insufficient documentation

## 2022-03-12 DIAGNOSIS — I10 Essential (primary) hypertension: Secondary | ICD-10-CM | POA: Insufficient documentation

## 2022-03-12 DIAGNOSIS — Z87891 Personal history of nicotine dependence: Secondary | ICD-10-CM | POA: Diagnosis not present

## 2022-03-12 DIAGNOSIS — J45909 Unspecified asthma, uncomplicated: Secondary | ICD-10-CM | POA: Insufficient documentation

## 2022-03-12 DIAGNOSIS — Z7984 Long term (current) use of oral hypoglycemic drugs: Secondary | ICD-10-CM | POA: Diagnosis not present

## 2022-03-12 DIAGNOSIS — Z794 Long term (current) use of insulin: Secondary | ICD-10-CM | POA: Insufficient documentation

## 2022-03-12 NOTE — Progress Notes (Signed)
Radiation Oncology         [phone] ________________________________  Name: Stephanie [name]        MRN: [id number]  Date of Service: 03/12/2022 DOB: 03-Jun-1935  CC:[facility], [place], [place]     REFERRING PHYSICIAN: Garner Nash, DO   DIAGNOSIS: The encounter diagnosis was Primary malignant neoplasm of right lower lobe of lung ([place]). Solitary RLL lung nodule; positive on PET with biopsy showing atypical cells.  HISTORY OF PRESENT ILLNESS: Stephanie [name] is a 87 y.o. female former smoker with a past medical history of asthma, HTN, HLD, and T2DM.  seen at the request of Dr. Valeta Harms for a pulmonary nodule increasing in size. She originally had a chest CT in 2015 which revealed an irregular right lung nodule measuring 0.8 x 0.7 cm. Continued surveillance was recommended. PET scan on 05/12/20 revealed low metabolic activity associated with the right lung nodule with no evidence of metastatic mediastinal lymphadenopathy. Most recent CT of the chest on 10/30/21 showed a 1.7 x 1.0 cm irregular ill-defined nodule in the anterior right lower lobe, with slightly increase in size from previously measured at 1.6 x 0.8 cm. Accordingly, she was referred to Dr. Valeta Harms for tissue sampling of the concerning nodule. Pathology from biopsy on 03/02/22 of RLL lung mass revealed atypical cells. She was kindly referred to [place] for discussion of radiation therapy treatment options.   Today she is present with her supportive daughter. She reports that her breathing is "good". She experiences mild shortness of breath on exertion, but denies any cough, hemoptysis, chest pain, or fevers. She has lost 12 pounds over the past month, however, her appetite has remained strong.   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 Burt Knack and Dr. Cyndia Bent.   Severe aortic stenosis    Symptomatic anemia    Presumed GI bleed January 2021 - she declined GI work-up   Type 2 diabetes mellitus ([place])        PAST SURGICAL HISTORY: Past Surgical History:  Procedure Laterality Date   APPENDECTOMY     BACK SURGERY     BRONCHIAL BIOPSY  03/02/2022   Procedure: BRONCHIAL BIOPSIES;  Surgeon: Garner Nash, DO;  Location: [facility];  Service: Pulmonary;;   BRONCHIAL BRUSHINGS  03/02/2022   Procedure: BRONCHIAL BRUSHINGS;  Surgeon: Garner Nash, DO;  Location: [facility];  Service: Pulmonary;;   BRONCHIAL NEEDLE ASPIRATION BIOPSY  03/02/2022   Procedure: BRONCHIAL NEEDLE ASPIRATION BIOPSIES;  Surgeon: Garner Nash, DO;  Location: [facility];  Service: Pulmonary;;   CYST REMOVAL HAND     Right, hospital in [place]  03/02/2022   Procedure: FIDUCIAL MARKER PLACEMENT;  Surgeon: Garner Nash, DO;  Location: [facility];  Service: Pulmonary;;   INTRAOPERATIVE TRANSTHORACIC ECHOCARDIOGRAM Left 07/01/2020   Procedure: INTRAOPERATIVE TRANSTHORACIC ECHOCARDIOGRAM;  Surgeon: Sherren Mocha, MD;  Location: [facility];  Service: Open Heart Surgery;  Laterality: Left;   KNEE ARTHROSCOPY WITH MEDIAL MENISECTOMY  Right 02/28/2012   Procedure: KNEE ARTHROSCOPY WITH MEDIAL MENISECTOMY;  Surgeon: Carole Civil, MD;  Location: [facility];  Service: Orthopedics;  Laterality: Right;   REPAIR KNEE LIGAMENT     RIGHT/LEFT HEART CATH AND CORONARY ANGIOGRAPHY N/A 04/10/2020   Procedure: RIGHT/LEFT HEART CATH AND CORONARY ANGIOGRAPHY;  Surgeon: Jettie Booze, MD;  Location: [facility];  Service: Cardiovascular;  Laterality: N/A;   TRANSCATHETER AORTIC VALVE REPLACEMENT,  TRANSFEMORAL N/A 07/01/2020   Procedure: TRANSCATHETER AORTIC VALVE REPLACEMENT, TRANSFEMORAL;  Surgeon: Sherren Mocha, MD;  Location: [facility];  Service: Open Heart Surgery;  Laterality: N/A;   ULTRASOUND GUIDANCE FOR VASCULAR ACCESS Bilateral 07/01/2020   Procedure: ULTRASOUND GUIDANCE FOR VASCULAR ACCESS;  Surgeon: Sherren Mocha, MD;  Location: [facility];  Service: Open Heart Surgery;  Laterality: Bilateral;     FAMILY HISTORY:  Family History  Problem Relation Age of Onset   Lung disease Other    Cancer Other    Arthritis Other    Asthma Other    Diabetes Other    Diabetes Mother    Hypertension Mother    Diverticulitis Mother    Diabetes Father    Hypertension Sister    Diabetes Brother    Colon cancer Neg Hx      SOCIAL HISTORY:  reports that she has quit smoking. Her smoking use included cigarettes. She has a 7.00 pack-year smoking history. She has never used smokeless tobacco. She reports that she does not drink alcohol and does not use drugs.   ALLERGIES: Amoxicillin, Penicillins, Sulfa antibiotics, and Nitrofurantoin   MEDICATIONS:  Current Outpatient Medications  Medication Sig Dispense Refill   albuterol (PROVENTIL) (2.5 MG/3ML) 0.083% nebulizer solution Take 2.5 mg by nebulization every 6 (six) hours as needed for wheezing or shortness of breath.   2   carvedilol (COREG) 6.25 MG tablet Take 1 tablet (6.25 mg total) by mouth 2 (two) times daily. 180 tablet 3   ferrous sulfate 325 (65 FE) MG EC tablet Take 325 mg by mouth daily with breakfast.     furosemide (LASIX) 40 MG tablet Take 40 mg by mouth daily.     gabapentin (NEURONTIN) 100 MG capsule Take 100-200 mg by mouth at bedtime.     Insulin Detemir (LEVEMIR FLEXTOUCH) 100 UNIT/ML Pen Inject 30 Units into the skin at bedtime. (Patient taking differently: Inject 40 Units into the skin at bedtime.) 15 mL 11   metFORMIN (GLUCOPHAGE) 500 MG tablet Take 1 tablet (500 mg total) by mouth 2 (two) times daily.      neomycin-polymyxin-hydrocortisone (CORTISPORIN) 3.5-10000-1 OTIC suspension Place 4 drops into the left ear 3 (three) times daily.     apixaban (ELIQUIS) 5 MG TABS tablet Take 1 tablet (5 mg total) by mouth 2 (two) times daily. (Patient not taking: Reported on 02/26/2022) 120 tablet 0   No current facility-administered medications for this encounter.     REVIEW OF SYSTEMS: As per HPI.      PHYSICAL EXAM:  Wt Readings from Last 3 Encounters:  03/12/22 186 lb 3.2 oz (84.5 kg)  03/09/22 188 lb 9.6 oz (85.5 kg)  03/02/22 215 lb (97.5 kg)   Temp Readings from Last 3 Encounters:  03/12/22 97.8 F (36.6 C)  03/09/22 98 F (36.7 C) (Oral)  03/02/22 (!) 97.3 F (36.3 C)   BP Readings from Last 3 Encounters:  03/12/22 (!) 160/72  03/09/22 (!) 126/50  03/02/22 (!) 166/56   Pulse Readings from Last 3 Encounters:  03/12/22 64  03/09/22 (!) 58  03/02/22 73   Pain Assessment Pain Score: 0-No pain/10  In general this is a well appearing female in no acute distress. She's alert and oriented x4 and appropriate throughout the examination. Cardiopulmonary assessment is negative for acute distress and she exhibits normal effort.     ECOG = 1  0 - Asymptomatic (Fully active, able to carry on all predisease activities without restriction)  1 - Symptomatic but completely ambulatory (Restricted in physically strenuous activity but ambulatory and able to carry out work of a light or sedentary nature. For example, light housework, office work)  2 - Symptomatic, <50% in bed during the day (Ambulatory and capable of all self care but unable to carry out any work activities. Up and about more than 50% of waking hours)  3 - Symptomatic, >50% in bed, but not bedbound (Capable of only limited self-care, confined to bed or chair 50% or more of waking hours)  4 - Bedbound (Completely disabled. Cannot carry on any self-care. Totally confined to bed or chair)  5 - Death   Eustace Pen MM, Creech RH, Tormey  DC, et al. [phone]). "Toxicity and response criteria of the [facility]". [place] Oncol. 5 (6): 649-55    LABORATORY DATA:  Lab Results  Component Value Date   WBC 11.5 (H) 03/02/2022   HGB 12.8 03/02/2022   HCT 39.0 03/02/2022   MCV 85.9 03/02/2022   PLT 196 03/02/2022   Lab Results  Component Value Date   NA 136 03/02/2022   K 4.8 03/02/2022   CL 100 03/02/2022   CO2 25 03/02/2022   Lab Results  Component Value Date   ALT 9 07/03/2021   AST 13 (L) 07/03/2021   ALKPHOS 155 (H) 07/03/2021   BILITOT 0.9 07/03/2021      RADIOGRAPHY: DG Chest Port 1 View  Result Date: 03/02/2022 CLINICAL DATA:  Status post bronchoscopy with biopsy. EXAM: PORTABLE CHEST 1 VIEW COMPARISON:  Chest CT dated 02/12/2022 and chest radiograph dated 10/13/2021. FINDINGS: The heart is enlarged. Vascular calcifications are seen in the aortic arch. A fiducial marker is seen in the right lower lung, likely reflecting the site of biopsy. Associated opacity likely reflects the previously described subsolid right lower lobe pulmonary nodule. There is mild right basilar atelectasis. There is suggestion of a small left pleural effusion with associated atelectasis/airspace disease. No pneumothorax is identified. Degenerative changes are seen in the spine. IMPRESSION: 1. A fiducial marker is seen in the right lower lung, likely reflecting the site of biopsy. Associated opacity likely reflects the previously described subsolid right lower lobe pulmonary nodule. 2. Small left pleural effusion with associated atelectasis/airspace disease. Electronically Signed   By: Zerita Boers M.D.   On: 03/02/2022 12:09   DG C-ARM BRONCHOSCOPY  Result Date: 03/02/2022 C-ARM BRONCHOSCOPY: Fluoroscopy was utilized by the requesting physician.  No radiographic interpretation.   CT Super D Chest Wo Contrast  Result Date: 02/15/2022 CLINICAL DATA:  Lung nodule. EXAM: CT CHEST WITHOUT CONTRAST TECHNIQUE:  Multidetector CT imaging of the chest was performed using thin slice collimation for electromagnetic bronchoscopy planning purposes, without intravenous contrast. RADIATION DOSE REDUCTION: This exam was performed according to the departmental dose-optimization program which includes automated exposure control, adjustment of the mA and/or kV according to patient size and/or use of iterative reconstruction technique. COMPARISON:  10/30/2021, 03/05/2021, 11/19/2020, 04/25/2020 and 11/04/2013. FINDINGS: Cardiovascular: Atherosclerotic calcification of the aorta and coronary arteries. Aortic valve replacement. Enlarged pulmonic trunk and heart.  No pericardial effusion. Mediastinum/Nodes: No pathologically enlarged mediastinal or axillary lymph nodes. Hilar regions are difficult to definitively evaluate without IV contrast. Esophagus is grossly unremarkable. Lungs/Pleura: Image quality is somewhat degraded by expiratory phase imaging. Biapical pleuroparenchymal scarring. Mild paraseptal emphysema. Subsolid nodule in the anterior right lower lobe overall measures 1.1 x 2.5 cm (8/84) with an internal 0.9 x 1.6 cm solid component, similar dating back to [id number] and hypometabolic on [id number]. However, lesion has enlarged from remote prior exam of 11/04/2013, at which time it measured overall 1.0 x 1.5 cm with an internal 6 mm nodular component. Minimal scarring in the lingula and right middle lobe. No pleural fluid. Airway is unremarkable. Upper Abdomen: Probable liver cysts, measuring up to 2.8 cm in the left hepatic lobe. Visualized portions of the liver, gallbladder and right adrenal gland are otherwise unremarkable. 2.3 cm nodule in the left adrenal gland measures 13 Hounsfield units. No follow-up necessary. Partially imaged 2.8 cm low-attenuation lesion in the right kidney. No specific follow-up necessary. 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 Visualized portions of the left kidney,  spleen, pancreas, stomach and bowel are otherwise unremarkable. No upper abdominal adenopathy. Musculoskeletal: Degenerative changes in the spine. No worrisome lytic or sclerotic lesions. IMPRESSION: 1. Subsolid right lower lobe nodule, stable from 04/25/2020 but enlarged from 11/04/2013. Lesion remains worrisome for indolent adenocarcinoma. 2. Left adrenal adenoma. 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 4. Aortic atherosclerosis (ICD10-I70.0). Coronary artery calcification. 5. Enlarged pulmonic trunk, indicative of pulmonary arterial hypertension. 6.  Emphysema (ICD10-J43.9). Electronically Signed   By: Lorin Picket M.D.   On: 02/15/2022 10:11     We personally reviewed the images with the patient today.  IMPRESSION/PLAN: 1. Solitary RLL lung nodule; positive on PET with biopsy showing atypical cells.  Today, we discussed the pathology results and the nature of pulmonary nodules increasing in size. Patient is a good candidate for radiation therapy to the PET positive pulmonary nodule. Dr. Lisbeth Renshaw is in agreement with plans for SBRT.   We discussed the risks, benefits, short, and long term effects of radiotherapy, as well as the curative intent, and the patient is interested in proceeding. Dr. Lisbeth Renshaw discussed the delivery and logistics of radiotherapy and anticipates a course of 3-5 fractions of SBRT to the PET positive RLL lung nodule.  Patient has a good understanding of the treatment plan and is enthusiastic about beginning treatment. All questions were answered. A consent form was signed today and placed in patient's chart. We will schedule her CT simulation appointment and plan to begin radiation treatment approximately 1-2 weeks following.   In a visit lasting 60 minutes, greater than 50% of the time was spent face to face discussing the patient's condition, in preparation for the discussion, and coordinating the patient's care.   The above documentation reflects my direct  findings during this shared patient visit. Please see the separate note by Dr. Lisbeth Renshaw on this date for the remainder of the patient's plan of care.    Leona Singleton, [place]    **Disclaimer: This note was dictated with voice recognition software. Similar sounding words can inadvertently be transcribed and this note may contain transcription errors which may not have been corrected upon publication of note.**

## 2022-03-15 ENCOUNTER — Other Ambulatory Visit (INDEPENDENT_AMBULATORY_CARE_PROVIDER_SITE_OTHER): Payer: Self-pay | Admitting: Nurse Practitioner

## 2022-03-15 DIAGNOSIS — I739 Peripheral vascular disease, unspecified: Secondary | ICD-10-CM

## 2022-03-18 ENCOUNTER — Ambulatory Visit (INDEPENDENT_AMBULATORY_CARE_PROVIDER_SITE_OTHER): Payer: Medicare HMO | Admitting: Nurse Practitioner

## 2022-03-18 ENCOUNTER — Encounter (INDEPENDENT_AMBULATORY_CARE_PROVIDER_SITE_OTHER): Payer: Self-pay | Admitting: Nurse Practitioner

## 2022-03-18 ENCOUNTER — Ambulatory Visit (INDEPENDENT_AMBULATORY_CARE_PROVIDER_SITE_OTHER): Payer: Medicare HMO

## 2022-03-18 VITALS — BP 135/74 | HR 62 | Resp 16

## 2022-03-18 DIAGNOSIS — E782 Mixed hyperlipidemia: Secondary | ICD-10-CM

## 2022-03-18 DIAGNOSIS — I10 Essential (primary) hypertension: Secondary | ICD-10-CM | POA: Diagnosis not present

## 2022-03-18 DIAGNOSIS — I739 Peripheral vascular disease, unspecified: Secondary | ICD-10-CM

## 2022-03-18 LAB — VAS US ABI WITH/WO TBI
Left ABI: 0.63
Right ABI: 0.69

## 2022-03-21 DIAGNOSIS — J449 Chronic obstructive pulmonary disease, unspecified: Secondary | ICD-10-CM | POA: Insufficient documentation

## 2022-03-21 NOTE — Progress Notes (Deleted)
Stephanie [name], female    DOB: [birth year],   MRN: [id number]   Brief patient profile:  53   yobf  quit smoking 2000 with doe then  referred to pulmonary clinic in [place]  03/31/2021 by Kathlene November for spn     History of Present Illness  03/31/2021  Pulmonary/ 1st office eval/ [name] / [facility]  Chief Complaint  Patient presents with   Consult    CT done in Feb 2023 showed pulmonary nodules.   Dyspnea:  can still do [employer] but usually uses scooter due to back  Also walks [place] slow pace s doe  Cough: none  Sleep: 2 pillows bed is flat SABA use: neb not even once a month  Rec We will schedule you for PFTs > not done as of 11/13/2021  also contact you in the future for CT @ 1 years from the last.  Call sooner for worse breathing or chest pain when you breathe in or cough that won't go away after a few weeks.   11/13/2021  f/u ov/[place] office/Elynore Dolinski re: Nodule f/u maint on no resp rx   Chief Complaint  Patient presents with   Follow-up    Lung nodule seen on CT   Dyspnea: rides scooter for years due to back / walks next door to sister with rollator  Cough: none  Sleeping: 2 pillows/ flat bed  SABA use: has neb, not using  02: none  Covid status: vax / never infected  Rec My office will be contacting you by phone for referral to [facility] office for PFTs and same day visit with Dr Windell Norfolk or [place]   Follow up here is as needed in [facility]     03/22/2022  f/u ov/Mckensey Berghuis re: ***   maint on ***  No chief complaint on file.   Dyspnea:  *** Cough: *** Sleeping: *** SABA use: *** 02: *** Covid status:   *** Lung cancer screening :  ***    No obvious day to day or daytime variability or assoc excess/ purulent sputum or mucus plugs or hemoptysis or cp or chest tightness, subjective wheeze or overt sinus or hb symptoms.   *** without nocturnal  or early am exacerbation  of respiratory  c/o's or need for noct saba. Also denies any obvious  fluctuation of symptoms with weather or environmental changes or other aggravating or alleviating factors except as outlined above   No unusual exposure hx or h/o childhood pna/ asthma or knowledge of premature birth.  Current Allergies, Complete Past Medical History, Past Surgical History, Family History, and Social History were reviewed in [employer] record.  ROS  The following are not active complaints unless bolded Hoarseness, sore throat, dysphagia, dental problems, itching, sneezing,  nasal congestion or discharge of excess mucus or purulent secretions, ear ache,   fever, chills, sweats, unintended wt loss or wt gain, classically pleuritic or exertional cp,  orthopnea pnd or arm/hand swelling  or leg swelling, presyncope, palpitations, abdominal pain, anorexia, nausea, vomiting, diarrhea  or change in bowel habits or change in bladder habits, change in stools or change in urine, dysuria, hematuria,  rash, arthralgias, visual complaints, headache, numbness, weakness or ataxia or problems with walking or coordination,  change in mood or  memory.        No outpatient medications have been marked as taking for the 03/22/22 encounter (Appointment) with Tanda Rockers, MD.  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 Burt Knack and Dr. Cyndia Bent.   Severe aortic stenosis    Symptomatic anemia    Presumed GI bleed January 2021 - she declined GI work-up   Type 2 diabetes mellitus ([facility])         Objective:    Wts   03/22/2022         ***  11/13/21 181 lb 3.2 oz (82.2 kg)  10/14/21 183 lb 6.4 oz (83.2 kg)  10/13/21 225 lb (102.1 kg)     Vital signs reviewed  03/22/2022  - Note at rest 02 sats  ***% on ***   General appearance:     ***             Assessment

## 2022-03-22 ENCOUNTER — Ambulatory Visit: Payer: Medicare HMO | Admitting: Internal Medicine

## 2022-03-22 DIAGNOSIS — R911 Solitary pulmonary nodule: Secondary | ICD-10-CM

## 2022-03-22 DIAGNOSIS — J449 Chronic obstructive pulmonary disease, unspecified: Secondary | ICD-10-CM

## 2022-03-23 ENCOUNTER — Ambulatory Visit
Admission: RE | Admit: 2022-03-23 | Discharge: 2022-03-23 | Disposition: A | Discharge status: Home / Self Care | Payer: Medicare HMO | Source: Ambulatory Visit | Attending: Radiation Oncology | Admitting: Radiation Oncology

## 2022-03-23 DIAGNOSIS — Z510 Encounter for antineoplastic radiation therapy: Secondary | ICD-10-CM | POA: Diagnosis present

## 2022-03-23 DIAGNOSIS — C3431 Malignant neoplasm of lower lobe, right bronchus or lung: Secondary | ICD-10-CM | POA: Diagnosis not present

## 2022-04-05 DIAGNOSIS — Z510 Encounter for antineoplastic radiation therapy: Secondary | ICD-10-CM | POA: Diagnosis not present

## 2022-04-06 ENCOUNTER — Ambulatory Visit: Payer: Medicare HMO | Admitting: Radiation Oncology

## 2022-04-06 ENCOUNTER — Encounter (INDEPENDENT_AMBULATORY_CARE_PROVIDER_SITE_OTHER): Payer: Self-pay | Admitting: Nurse Practitioner

## 2022-04-06 NOTE — Progress Notes (Signed)
Subjective:    Patient ID: Stephanie [name], female    DOB: 02/04/1935, 87 y.o.   MRN: [id number] Chief Complaint  Patient presents with   Follow-up    Ultrasound follow up    Stephanie [name] is a 87 year old female that returns to follow-up on her peripheral arterial disease.  The patient notes that the previous pain she was having has decreased greatly.  She notes that she has it but occasionally.  She notes that when she does have pain, Tylenol typically is helpful for fixing it.  She continues to have no rest pain or open ulcerations.  She has no claudication-like symptoms and has been walking more recently.  She has recently been diagnosed with lung cancer and notes that she will be undergoing treatment soon.  Today noninvasive symptoms show a right ABI of 0.69 and a left ABI of 0.63.  The patient's previous ABIs 1.23 on the right and 1.09 on the left.  She has monophasic tibial artery waveforms bilaterally with dampened toe waveforms bilaterally.    Review of Systems  Musculoskeletal:  Positive for gait problem.  Neurological:  Positive for weakness.  All other systems reviewed and are negative.      Objective:   Physical Exam Vitals reviewed.  HENT:     Head: Normocephalic.  Cardiovascular:     Rate and Rhythm: Normal rate.     Pulses:          Dorsalis pedis pulses are detected w/ Doppler on the right side and detected w/ Doppler on the left side.       Posterior tibial pulses are detected w/ Doppler on the right side and detected w/ Doppler on the left side.  Pulmonary:     Effort: Pulmonary effort is normal.  Skin:    General: Skin is warm and dry.  Neurological:     Mental Status: She is alert and oriented to person, place, and time.  Psychiatric:        Mood and Affect: Mood normal.        Behavior: Behavior normal.        Thought Content: Thought content normal.        Judgment: Judgment normal.     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 Burt Knack and Dr. Cyndia Bent.   Severe aortic stenosis    Symptomatic anemia    Presumed GI bleed January 2021 - she declined GI work-up   Type 2 diabetes mellitus ([place])     Social History   Socioeconomic History   Marital status: Divorced    Spouse name: Not on file   Number of children: 3   Years of education: Not on file   Highest education level: Not on file  Occupational History   Occupation: Retired-cook   Tobacco Use   Smoking status: Former    Packs/day: 1.00    Years: 7.00    Additional pack years: 0.00    Total pack years: 7.00  Types: Cigarettes   Smokeless tobacco: Never   Tobacco comments:    Quit over 23 years ago  Vaping Use   Vaping Use: Never used  Substance and Sexual Activity   Alcohol use: No   Drug use: No   Sexual activity: Never  Other Topics Concern   Not on file  Social History Narrative   ** Merged History Encounter **       Social Determinants of Health   Financial Resource Strain: Not on file  Food Insecurity: No Food Insecurity (03/12/2022)   Hunger Vital Sign    Worried About Running Out of Food in the Last Year: Never true    Ran Out of Food in the Last Year: Never true  Transportation Needs: No Transportation Needs (03/12/2022)   PRAPARE - Hydrologist (Medical): No    Lack of Transportation (Non-Medical): No  Physical Activity: Not on file  Stress: Not on file  Social Connections: Not on file  Intimate Partner Violence: Not At Risk (03/12/2022)   Humiliation, Afraid, Rape, and Kick questionnaire    Fear of Current  or Ex-Partner: No    Emotionally Abused: No    Physically Abused: No    Sexually Abused: No    Past Surgical History:  Procedure Laterality Date   APPENDECTOMY     BACK SURGERY     BRONCHIAL BIOPSY  03/02/2022   Procedure: BRONCHIAL BIOPSIES;  Surgeon: Garner Nash, DO;  Location: [facility];  Service: Pulmonary;;   BRONCHIAL BRUSHINGS  03/02/2022   Procedure: BRONCHIAL BRUSHINGS;  Surgeon: Garner Nash, DO;  Location: [place];  Service: Pulmonary;;   BRONCHIAL NEEDLE ASPIRATION BIOPSY  03/02/2022   Procedure: BRONCHIAL NEEDLE ASPIRATION BIOPSIES;  Surgeon: Garner Nash, DO;  Location: [facility];  Service: Pulmonary;;   CYST REMOVAL HAND     Right, hospital in [place]  03/02/2022   Procedure: FIDUCIAL MARKER PLACEMENT;  Surgeon: Garner Nash, DO;  Location: [facility];  Service: Pulmonary;;   INTRAOPERATIVE TRANSTHORACIC ECHOCARDIOGRAM Left 07/01/2020   Procedure: INTRAOPERATIVE TRANSTHORACIC ECHOCARDIOGRAM;  Surgeon: Sherren Mocha, MD;  Location: [place];  Service: Open Heart Surgery;  Laterality: Left;   KNEE ARTHROSCOPY WITH MEDIAL MENISECTOMY Right 02/28/2012   Procedure: KNEE ARTHROSCOPY WITH MEDIAL MENISECTOMY;  Surgeon: Carole Civil, MD;  Location: [facility];  Service: Orthopedics;  Laterality: Right;   REPAIR KNEE LIGAMENT     RIGHT/LEFT HEART CATH AND CORONARY ANGIOGRAPHY N/A 04/10/2020   Procedure: RIGHT/LEFT HEART CATH AND CORONARY ANGIOGRAPHY;  Surgeon: Jettie Booze, MD;  Location: [facility];  Service: Cardiovascular;  Laterality: N/A;   TRANSCATHETER AORTIC VALVE REPLACEMENT, TRANSFEMORAL N/A 07/01/2020   Procedure: TRANSCATHETER AORTIC VALVE REPLACEMENT, TRANSFEMORAL;  Surgeon: Sherren Mocha, MD;  Location: [place];  Service: Open Heart Surgery;  Laterality: N/A;   ULTRASOUND GUIDANCE FOR VASCULAR ACCESS Bilateral 07/01/2020   Procedure: ULTRASOUND GUIDANCE FOR VASCULAR ACCESS;  Surgeon: Sherren Mocha, MD;   Location: [place];  Service: Open Heart Surgery;  Laterality: Bilateral;    Family History  Problem Relation Age of Onset   Lung disease Other    Cancer Other    Arthritis Other    Asthma Other    Diabetes Other    Diabetes Mother    Hypertension Mother    Diverticulitis Mother    Diabetes Father    Hypertension Sister    Diabetes Brother    Colon cancer Neg Hx  Allergies  Allergen Reactions   Amoxicillin Swelling    Tongue swelling    Penicillins Swelling    Has patient had a PCN reaction causing immediate rash, facial/tongue/throat swelling, SOB or lightheadedness with hypotension: No Has patient had a PCN reaction causing severe rash involving mucus membranes or skin necrosis: No Has patient had a PCN reaction that required hospitalization: No Has patient had a PCN reaction occurring within the last 10 years: No If all of the above answers are "NO", then may proceed with Cephalosporin use.    Sulfa Antibiotics Swelling    Lip swelling   Nitrofurantoin Nausea And Vomiting       Latest Ref Rng & Units 03/02/2022    8:59 AM 10/13/2021    6:08 PM 09/29/2021    4:40 PM  CBC  WBC 4.0 - 10.5 K/uL 11.5  9.8  9.8   Hemoglobin 12.0 - 15.0 g/dL 12.8  12.3  12.5   Hematocrit 36.0 - 46.0 % 39.0  39.1  39.6   Platelets 150 - 400 K/uL 196  221  229       CMP     Component Value Date/Time   NA 136 03/02/2022 0859   NA 139 07/20/2021 1324   K 4.8 03/02/2022 0859   CL 100 03/02/2022 0859   CO2 25 03/02/2022 0859   GLUCOSE 107 (H) 03/02/2022 0859   BUN 24 (H) 03/02/2022 0859   BUN 24 07/20/2021 1324   CREATININE 1.34 (H) 03/02/2022 0859   CALCIUM 9.7 03/02/2022 0859   PROT 7.1 07/03/2021 2003   ALBUMIN 3.9 07/03/2021 2003   AST 13 (L) 07/03/2021 2003   ALT 9 07/03/2021 2003   ALKPHOS 155 (H) 07/03/2021 2003   BILITOT 0.9 07/03/2021 2003   GFRNONAA 39 (L) 03/02/2022 0859   GFRAA 49 (L) 02/10/2019 0610     No results found.     Assessment & Plan:   1. PAD  (peripheral artery disease) (HCC)  Recommend:  The patient has evidence of atherosclerosis of the lower extremities with minimal claudication.  She currently denies rest pain or open wounds or ulcerations.  Currently, and understandably so the patient is focused on her lung cancer treatments.  She is advised if she begins to develop breast pain or open wounds or ulcerations that she should see [place] sooner for follow-up.  We discussed the signs symptoms of this as well as discoloration of the lower extremities.  No invasive studies, angiography or surgery at this time The patient should continue walking and begin a more formal exercise program.  The patient should continue antiplatelet therapy and aggressive treatment of the lipid abnormalities  No changes in the patient's medications at this time  The patient should continue wearing graduated compression socks 10-15 mmHg strength to control the mild edema.    Patient return in 6 months or sooner if issues worsen.  2. Essential hypertension, benign Continue antihypertensive medications as already ordered, these medications have been reviewed and there are no changes at this time.   3. Mixed hyperlipidemia Continue statin as ordered and reviewed, no changes at this time    Current Outpatient Medications on File Prior to Visit  Medication Sig Dispense Refill   albuterol (PROVENTIL) (2.5 MG/3ML) 0.083% nebulizer solution Take 2.5 mg by nebulization every 6 (six) hours as needed for wheezing or shortness of breath.   2   carvedilol (COREG) 6.25 MG tablet Take 1 tablet (6.25 mg total) by mouth 2 (two) times daily. [place]  tablet 3   ferrous sulfate 325 (65 FE) MG EC tablet Take 325 mg by mouth daily with breakfast.     furosemide (LASIX) 40 MG tablet Take 40 mg by mouth daily.     gabapentin (NEURONTIN) 100 MG capsule Take 100-200 mg by mouth at bedtime.     Insulin Detemir (LEVEMIR FLEXTOUCH) 100 UNIT/ML Pen Inject 30 Units into the skin at  bedtime. (Patient taking differently: Inject 40 Units into the skin at bedtime.) 15 mL 11   metFORMIN (GLUCOPHAGE) 500 MG tablet Take 1 tablet (500 mg total) by mouth 2 (two) times daily.     neomycin-polymyxin-hydrocortisone (CORTISPORIN) 3.5-10000-1 OTIC suspension Place 4 drops into the left ear 3 (three) times daily.     apixaban (ELIQUIS) 5 MG TABS tablet Take 1 tablet (5 mg total) by mouth 2 (two) times daily. (Patient not taking: Reported on 02/26/2022) 120 tablet 0   No current facility-administered medications on file prior to visit.    There are no Patient Instructions on file for this visit. No follow-ups on file.   Kris Hartmann, NP

## 2022-04-07 ENCOUNTER — Encounter: Payer: Self-pay | Admitting: Internal Medicine

## 2022-04-07 ENCOUNTER — Ambulatory Visit: Payer: Medicare HMO | Admitting: Internal Medicine

## 2022-04-07 ENCOUNTER — Ambulatory Visit: Payer: Medicare HMO | Admitting: Radiation Oncology

## 2022-04-07 VITALS — BP 124/64 | HR 64 | Temp 98.1°F | Ht 65.0 in | Wt 189.0 lb

## 2022-04-07 DIAGNOSIS — J449 Chronic obstructive pulmonary disease, unspecified: Secondary | ICD-10-CM | POA: Diagnosis not present

## 2022-04-07 MED ORDER — ALBUTEROL SULFATE (2.5 MG/3ML) 0.083% IN NEBU: 2.5000 mg | INHALATION_SOLUTION | RESPIRATORY_TRACT | 2 refills | Status: DC | PRN

## 2022-04-07 MED ORDER — BUDESONIDE 0.25 MG/2ML IN SUSP: RESPIRATORY_TRACT | 12 refills | Status: DC

## 2022-04-07 NOTE — Progress Notes (Signed)
Stephanie [name], female    DOB: 10/06/1935,   MRN: [id number]   Brief patient profile:  86   yobf  quit smoking 2000 with doe then  referred to pulmonary clinic in [place]  03/31/2021 by Kathlene November for spn     History of Present Illness  03/31/2021  Pulmonary/ 1st office eval/ Stephanie [name] / [place] [facility]  Chief Complaint  Patient presents with   Consult    CT done in Feb 2023 showed pulmonary nodules.   Dyspnea:  can still do [employer] but usually uses scooter due to back  Also [place] s doe  Cough: none  Sleep: 2 pillows bed is flat SABA use: neb not even once a month  Rec We will schedule you for PFTs > not done as of 11/13/2021  also contact you in the future for CT @ 1 years from the last.  Call sooner for worse breathing or chest pain when you breathe in or cough that won't go away after a few weeks.   11/13/2021  f/u ov/Stephanie [name] office/Stephanie [name] re: Nodule f/u maint on no resp rx   Chief Complaint  Patient presents with   Follow-up    Lung nodule seen on CT   Dyspnea: rides scooter for years due to back / walks next door to sister with rollator  Cough: none  Sleeping: 2 pillows/ flat bed  SABA use: has neb, not using  02: none  Covid status: vax / never infected  Rec My office will be contacting you by phone for referral to [facility] office for PFTs and same day visit with Dr Stephanie [name] or Stephanie [name]  > dx PET pos RLL SPN> SBRT planned       04/07/2022  f/u ov/Stephanie [name] re: GOLD 3 copd  maint on no rx  / SBRT starting 04/08/22  Chief Complaint  Patient presents with   Follow-up    Lung nodule.  Starting chemotherapy tomorrow at [facility].  Patient states wheezing last week.  Seen at [facility]. Does not know rx given.  Dyspnea:  balance is limiting / walks with rollator to sisters house next door  Cough: none  Sleeping: bed is flat 2 pillows or loses breath SABA use: neb prn bad attacks  02: none      No obvious day to day or daytime  variability or assoc excess/ purulent sputum or mucus plugs or hemoptysis or cp or chest tightness, subjective wheeze or overt sinus or hb symptoms.   Sleeping as above without nocturnal  or early am exacerbation  of respiratory  c/o's or need for noct saba. Also denies any obvious fluctuation of symptoms with weather or environmental changes or other aggravating or alleviating factors except as outlined above   No unusual exposure hx or h/o childhood pna/ asthma or knowledge of premature birth.  Current Allergies, Complete Past Medical History, Past Surgical History, Family History, and Social History were reviewed in [employer] record.  ROS  The following are not active complaints unless bolded Hoarseness, sore throat, dysphagia, dental problems, itching, sneezing,  nasal congestion or discharge of excess mucus or purulent secretions, ear ache,   fever, chills, sweats, unintended wt loss or wt gain, classically pleuritic or exertional cp,  orthopnea pnd or arm/hand swelling  or leg swelling, presyncope, palpitations, abdominal pain, anorexia, nausea, vomiting, diarrhea  or change in bowel habits or change in bladder habits, change in stools or change in urine, dysuria, hematuria,  rash, arthralgias, visual complaints, headache, numbness, weakness or ataxia or problems with walking or coordination,  change in mood or  memory.        Current Meds  Medication Sig   azithromycin (ZITHROMAX) 250 MG tablet Take 250 mg by mouth as directed.   BD PEN NEEDLE NANO U/F 32G X 4 MM MISC See admin instructions.   predniSONE (DELTASONE) 20 MG tablet Take 20 mg by mouth daily with breakfast. Take 3 tablets by mouth on first day and 1 tablet twice daily for 5 days Orally Twice a day for 6 days             Past Medical History:  Diagnosis Date   Arthritis    Asthma    CKD (chronic kidney disease)    Essential hypertension    Mixed hyperlipidemia    S/P TAVR (transcatheter aortic  valve replacement) 07/01/2020   s/p TAVR with a 26 mm Edwards S3U via the TF approach by Dr. Burt Knack and Dr. Cyndia Bent.   Severe aortic stenosis    Symptomatic anemia    Presumed GI bleed January 2021 - she declined GI work-up   Type 2 diabetes mellitus ([place])         Objective:    Wts   04/07/2022       189  11/13/21 181 lb 3.2 oz (82.2 kg)  10/14/21 183 lb 6.4 oz (83.2 kg)  10/13/21 225 lb (102.1 kg)     Vital signs reviewed  04/07/2022  - Note at rest 02 sats  97% on RA   General appearance:    amb bf nad  HEENT : Oropharynx  clear     NECK :  without  apparent JVD/ palpable Nodes/TM    LUNGS: no acc muscle use,  Min barrel  contour chest wall with bilateral  slightly decreased bs s audible wheeze and  without cough on insp or exp maneuvers and min  Hyperresonant  to  percussion bilaterally    CV:  slt irreg S1s2   no s3   2/6 SEM or increase in P2, and no edema   ABD:  soft and nontender with pos end  insp Hoover's  in the supine position.  No bruits or organomegaly appreciated   MS:    ext warm without deformities Or obvious joint restrictions  calf tenderness, cyanosis or clubbing     SKIN: warm and dry without lesions    NEURO:  alert, approp, nl sensorium with  no motor or cerebellar deficits apparent.             Assessment

## 2022-04-07 NOTE — Patient Instructions (Addendum)
Albuterol 2.5 mg with budesonide 0.25 mg twice daily in nebulizer  routinely (automatically)  and up to every 4 hours if needed  Please schedule a follow up visit in 3 months but call sooner if needed in [place] office

## 2022-04-07 NOTE — Assessment & Plan Note (Signed)
Quit smoking 2000 - PFT's  02/05/22  FEV1 0.91 (47 % ) ratio 0.70  (with shortened Texp)  p 3 % improvement from saba p 0 prior to study with DLCO  22 (115%)   and FV curve slt concave  with fev1/svc = 56% - 04/07/2022  After extensive coaching inhaler device,  effectiveness =    0% so try adding bud 0.25 mg to albuterol bid and prn   This is the same as airsupra at a fraction of the cost and should be be covered by either her HMO or Part D medicare at 80% / advised         Each maintenance medication was reviewed in detail including emphasizing most importantly the difference between maintenance and prns and under what circumstances the prns are to be triggered using an action plan format where appropriate.  Total time for H and P, chart review, counseling, reviewing hfa/neb  device(s) and generating customized AVS unique to this office visit / same day charting > 30 min

## 2022-04-08 ENCOUNTER — Other Ambulatory Visit: Payer: Self-pay

## 2022-04-08 ENCOUNTER — Ambulatory Visit
Admission: RE | Admit: 2022-04-08 | Discharge: 2022-04-08 | Disposition: A | Discharge status: Home / Self Care | Payer: Medicare HMO | Source: Ambulatory Visit | Attending: Radiation Oncology | Admitting: Radiation Oncology

## 2022-04-08 DIAGNOSIS — Z510 Encounter for antineoplastic radiation therapy: Secondary | ICD-10-CM | POA: Diagnosis not present

## 2022-04-08 LAB — RAD ONC ARIA SESSION SUMMARY
Course Elapsed Days: 0
Plan Fractions Treated to Date: 1
Plan Prescribed Dose Per Fraction: 18 Gy
Plan Total Fractions Prescribed: 3
Plan Total Prescribed Dose: 54 Gy
Reference Point Dosage Given to Date: 18 Gy
Reference Point Session Dosage Given: 18 Gy
Session Number: 1

## 2022-04-12 ENCOUNTER — Other Ambulatory Visit: Payer: Self-pay

## 2022-04-12 ENCOUNTER — Ambulatory Visit
Admission: RE | Admit: 2022-04-12 | Discharge: 2022-04-12 | Disposition: A | Discharge status: Home / Self Care | Payer: Medicare HMO | Source: Ambulatory Visit | Attending: Radiation Oncology | Admitting: Radiation Oncology

## 2022-04-12 ENCOUNTER — Ambulatory Visit: Payer: Medicare HMO | Attending: Nurse Practitioner | Admitting: Nurse Practitioner

## 2022-04-12 ENCOUNTER — Encounter: Payer: Self-pay | Admitting: Nurse Practitioner

## 2022-04-12 VITALS — BP 121/79 | HR 56 | Ht 65.0 in | Wt 196.0 lb

## 2022-04-12 DIAGNOSIS — I10 Essential (primary) hypertension: Secondary | ICD-10-CM

## 2022-04-12 DIAGNOSIS — I480 Paroxysmal atrial fibrillation: Secondary | ICD-10-CM | POA: Diagnosis not present

## 2022-04-12 DIAGNOSIS — Z952 Presence of prosthetic heart valve: Secondary | ICD-10-CM

## 2022-04-12 DIAGNOSIS — E785 Hyperlipidemia, unspecified: Secondary | ICD-10-CM | POA: Diagnosis not present

## 2022-04-12 DIAGNOSIS — Z510 Encounter for antineoplastic radiation therapy: Secondary | ICD-10-CM | POA: Diagnosis not present

## 2022-04-12 DIAGNOSIS — N183 Chronic kidney disease, stage 3 unspecified: Secondary | ICD-10-CM

## 2022-04-12 LAB — RAD ONC ARIA SESSION SUMMARY
Course Elapsed Days: 4
Plan Fractions Treated to Date: 2
Plan Prescribed Dose Per Fraction: 18 Gy
Plan Total Fractions Prescribed: 3
Plan Total Prescribed Dose: 54 Gy
Reference Point Dosage Given to Date: 36 Gy
Reference Point Session Dosage Given: 18 Gy
Session Number: 2

## 2022-04-12 NOTE — Patient Instructions (Signed)
Medication Instructions:  Your physician recommends that you continue on your current medications as directed. Please refer to the Current Medication list given to you today.  Labwork: none  Testing/Procedures: Your physician has requested that you have an echocardiogram. Echocardiography is a painless test that uses sound waves to create images of your heart. It provides your doctor with information about the size and shape of your heart and how well your heart's chambers and valves are working. This procedure takes approximately one hour. There are no restrictions for this procedure. Please do NOT wear cologne, perfume, aftershave, or lotions (deodorant is allowed). Please arrive 15 minutes prior to your appointment time.  Follow-Up: Your physician recommends that you schedule a follow-up appointment in: 6 months  Any Other Special Instructions Will Be Listed Below (If Applicable).  If you need a refill on your cardiac medications before your next appointment, please call your pharmacy. 

## 2022-04-12 NOTE — Progress Notes (Signed)
Office Visit    Patient Name: Stephanie [name] Date of Encounter: 04/12/2022  PCP:  [facility]  Cardiologist:  Rozann Lesches, MD  Advanced Practice Provider:  No care team member to display Electrophysiologist:  None   Chief Complaint    Stephanie [name] is a 87 y.o. female with a hx of severe aortic stenosis, s/p TAVR in 2022, PAF, COPD, hypertension, mixed hyperlipidemia, chronic kidney disease, hx of lung cancer (receiving tx), and presumed GI bleed in January 21, type 2 diabetes, who presents today for 71-month follow-up.  Past Medical History    Past Medical History:  Diagnosis Date   Arthritis    Asthma    CKD (chronic kidney disease)    COPD (chronic obstructive pulmonary disease) (HCC)    Essential hypertension    Heart murmur    Mixed hyperlipidemia    PAF (paroxysmal atrial fibrillation) (HCC)    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 Burt Knack and Dr. Cyndia Bent.   Severe aortic stenosis    Symptomatic anemia    Presumed GI bleed January 2021 - she declined GI work-up   Type 2 diabetes mellitus [facility])    Past Surgical History:  Procedure Laterality Date   APPENDECTOMY     BACK SURGERY     BRONCHIAL BIOPSY  03/02/2022   Procedure: BRONCHIAL BIOPSIES;  Surgeon: Garner Nash, DO;  Location: [place] [facility];  Service: Pulmonary;;   BRONCHIAL BRUSHINGS  03/02/2022   Procedure: BRONCHIAL BRUSHINGS;  Surgeon: Garner Nash, DO;  Location: [place] [facility];  Service: Pulmonary;;   BRONCHIAL NEEDLE ASPIRATION BIOPSY  03/02/2022   Procedure: BRONCHIAL NEEDLE ASPIRATION BIOPSIES;  Surgeon: Garner Nash, DO;  Location: [place] [facility];  Service: Pulmonary;;   CYST REMOVAL HAND     Right, hospital in [place]  03/02/2022   Procedure: FIDUCIAL MARKER PLACEMENT;  Surgeon: Garner Nash, DO;  Location: [place]  END[facility];  Service: Pulmonary;;   INTRAOPERATIVE TRANSTHORACIC ECHOCARDIOGRAM Left 07/01/2020   Procedure: INTRAOPERATIVE TRANSTHORACIC ECHOCARDIOGRAM;  Surgeon: Sherren Mocha, MD;  Location: [place];  Service: Open Heart Surgery;  Laterality: Left;   KNEE ARTHROSCOPY WITH MEDIAL MENISECTOMY Right 02/28/2012   Procedure: KNEE ARTHROSCOPY WITH MEDIAL MENISECTOMY;  Surgeon: Carole Civil, MD;  Location: [facility];  Service: Orthopedics;  Laterality: Right;   REPAIR KNEE LIGAMENT     RIGHT/LEFT HEART CATH AND CORONARY ANGIOGRAPHY N/A 04/10/2020   Procedure: RIGHT/LEFT HEART CATH AND CORONARY ANGIOGRAPHY;  Surgeon: Jettie Booze, MD;  Location: [place] [facility];  Service: Cardiovascular;  Laterality: N/A;   TRANSCATHETER AORTIC VALVE REPLACEMENT, TRANSFEMORAL N/A 07/01/2020   Procedure: TRANSCATHETER AORTIC VALVE REPLACEMENT, TRANSFEMORAL;  Surgeon: Sherren Mocha, MD;  Location: [place];  Service: Open Heart Surgery;  Laterality: N/A;   ULTRASOUND GUIDANCE FOR VASCULAR ACCESS Bilateral 07/01/2020   Procedure: ULTRASOUND GUIDANCE FOR VASCULAR ACCESS;  Surgeon: Sherren Mocha, MD;  Location: [place];  Service: Open Heart Surgery;  Laterality: Bilateral;    Allergies  Allergies  Allergen Reactions   Amoxicillin Swelling    Tongue swelling    Penicillins Swelling    Has patient had a PCN reaction causing immediate rash, facial/tongue/throat swelling, SOB or lightheadedness with hypotension: No Has patient had a PCN reaction causing severe rash involving mucus membranes or skin necrosis: No Has patient had  a PCN reaction that required hospitalization: No Has patient had a PCN reaction occurring within the last 10 years: No If all of the above answers are "NO", then may proceed with Cephalosporin use.    Sulfa Antibiotics Swelling    Lip swelling   Nitrofurantoin Nausea And Vomiting    History of Present Illness    Stephanie [name] is a 87 y.o. female with a PMH as mentioned  above.  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  Last seen by Dr. Domenic Polite on October 14, 2021.  She did admit to sharp sensation of left-sided chest discomfort that prompted ER evaluation prior to office visit.  CT of chest arranged showed an lung nodule on right lower lobe was concerning for neoplasm.  Was suggested to refer patient to [facility] pulmonary for further evaluation.  Today she presents for 29-month follow-up.  She states she is doing well. Receving SBRT tx for lung cancer. Has one more treatment to go. Denies any chest pain, shortness of breath, palpitations, syncope, presyncope, dizziness, orthopnea, PND, swelling or significant weight changes, acute bleeding, or claudication.   EKGs/Labs/Other Studies Reviewed:   The following studies were reviewed today:   EKG:  EKG is not ordered today.     Echo 06/2021:  1. Left ventricular ejection fraction, by estimation, is 50 to 55%. Left  ventricular ejection fraction by 2D MOD biplane is 51.9 %. The left  ventricle has low normal function. The left ventricle has no regional wall  motion abnormalities. There is  moderate left ventricular hypertrophy. Left ventricular diastolic  parameters are consistent with Grade I diastolic dysfunction (impaired  relaxation).   2. Right ventricular systolic function is normal. The right ventricular  size is normal. There is mildly elevated pulmonary artery systolic  pressure. The estimated right ventricular systolic pressure is 0000000 mmHg.   3. Left atrial size was moderately dilated.   4. Right atrial size was moderately dilated.   5. The mitral valve is abnormal. Trivial mitral valve regurgitation.   6. The aortic valve has been repaired/replaced. Aortic valve  regurgitation is not visualized. There is a 26 mm Sapien prosthetic (TAVR)  valve present in the aortic position. Procedure Date: 07/01/2020. Echo  findings are consistent with normal structure  and function  of the aortic valve prosthesis. Aortic valve area, by VTI  measures 1.83 cm. Aortic valve mean gradient measures 8.3 mmHg. Aortic  valve Vmax measures 2.10 m/s.   Comparison(s): Changes from prior study are noted. 08/07/2020: LVEF 45-50%,  TAVR - mean gradient 9 mmHg.    Recent Labs: 07/02/2021: TSH 3.200 07/03/2021: ALT 9 07/09/2021: Magnesium 1.8 10/13/2021: B Natriuretic Peptide 267.0 03/02/2022: BUN 24; Creatinine, Ser 1.34; Hemoglobin 12.8; Platelets 196; Potassium 4.8; Sodium 136  Recent Lipid Panel    Component Value Date/Time   CHOL 184 04/11/2020 0945   TRIG 283 (H) 04/11/2020 0945   HDL 46 04/11/2020 0945   CHOLHDL 4.0 04/11/2020 0945   VLDL 57 (H) 04/11/2020 0945   LDLCALC 81 04/11/2020 0945    Risk Assessment/Calculations:   CHA2DS2-VASc Score = 6  This indicates a 9.7% annual risk of stroke. The patient's score is based upon: CHF History: 0 HTN History: 1 Diabetes History: 1 Stroke History: 0 Vascular Disease History: 1 Age Score: 2 Gender Score: 1  Home Medications   Current Meds  Medication Sig   albuterol (PROVENTIL) (2.5 MG/3ML) 0.083% nebulizer solution Take 3 mLs (2.5 mg total) by nebulization every 4 (four) hours  as needed for wheezing or shortness of breath.   azithromycin (ZITHROMAX) 250 MG tablet Take 250 mg by mouth as directed.   BD PEN NEEDLE NANO U/F 32G X 4 MM MISC See admin instructions.   budesonide (PULMICORT) 0.25 MG/2ML nebulizer solution One vial twice daily with albuterol   carvedilol (COREG) 6.25 MG tablet Take 1 tablet (6.25 mg total) by mouth 2 (two) times daily.   ferrous sulfate 325 (65 FE) MG EC tablet Take 325 mg by mouth daily with breakfast.   furosemide (LASIX) 40 MG tablet Take 40 mg by mouth daily.   gabapentin (NEURONTIN) 100 MG capsule Take 100-200 mg by mouth at bedtime.   Insulin Detemir (LEVEMIR FLEXTOUCH) 100 UNIT/ML Pen Inject 30 Units into the skin at bedtime. (Patient taking differently: Inject 40 Units into the  skin at bedtime.)   metFORMIN (GLUCOPHAGE) 500 MG tablet Take 1 tablet (500 mg total) by mouth 2 (two) times daily.   neomycin-polymyxin-hydrocortisone (CORTISPORIN) 3.5-10000-1 OTIC suspension Place 4 drops into the left ear 3 (three) times daily.     Review of Systems    All other systems reviewed and are otherwise negative except as noted above.  Physical Exam    VS:  BP 121/79 (BP Location: Left Arm, Patient Position: Sitting, Cuff Size: Normal)   Pulse (!) 56   Ht 5\' 5"  (1.651 m)   Wt 196 lb (88.9 kg)   SpO2 94%   BMI 32.62 kg/m  , BMI Body mass index is 32.62 kg/m.  Wt Readings from Last 3 Encounters:  04/12/22 196 lb (88.9 kg)  04/07/22 189 lb (85.7 kg)  03/12/22 186 lb 3.2 oz (84.5 kg)     GEN: Obese, 87 y.o. female, in no acute distress. HEENT: normal. Neck: Supple, no JVD, carotid bruits, or masses. Cardiac: S1/S2, RRR, Grade 2/6 murmur, no rubs, no gallops. No clubbing, cyanosis, edema.  Radials/PT 2+ and equal bilaterally.  Respiratory:  Respirations regular and unlabored, clear to auscultation bilaterally. MS: No deformity or atrophy. Skin: Warm and dry, no rash. Neuro:  Strength and sensation are intact. Psych: Normal affect.  Assessment & Plan    Severe AS, s/p TAVR in 2022 TTE 06/2021 revealed stable aortic valve prosthesis function. Stable and denies any concerning symptoms. Will update Echo 06/2022 to evaluate valve function. Grade 2/6 murmur noted on exam. Continue current meds. SBE prophylaxis discussed and pt verbalized understanding. Heart healthy diet and regular cardiovascular exercise as tolerated encouraged.   PAF Denies any tachycardia or palpitations.  Heart rate 56 today, asymptomatic.  Continue current medication regimen. Currently not on [facility] and was taken off by another provider, she is unsure why. Documentation reviewed and appears was d/c on 04/07/2022 by Dr. [name]. Will route note to him to determine reason this was d/c.  CHA2DS2-VASc score 6.   Continue carvedilol. Heart healthy diet and regular cardiovascular exercise as tolerated encouraged.   HTN Blood pressure on arrival 142/72, repeat BP 121/79.  Continue current medication regimen. Discussed to monitor BP at home at least 2 hours after medications and sitting for 5-10 minutes. Heart healthy diet and regular cardiovascular exercise as tolerated encouraged.   HLD Last LDL obtained in 2022 was WNL.  Currently not on any lipid-lowering medications.  At next office visit, recommend to discuss obtaining FLP/requesting labs from PCP. Heart healthy diet and regular cardiovascular exercise as tolerated encouraged.   CKD 3 Stable kidney function.  Avoid nephrotoxic agents.  Encourage adequate hydration.  Continue current medication regimen.  Continue follow-up with PCP.  Disposition: Follow up in 6 month(s) with Rozann Lesches, MD or APP.  Signed, Finis Bud, NP 04/12/2022, 4:59 PM [place]

## 2022-04-13 ENCOUNTER — Other Ambulatory Visit: Payer: Self-pay | Admitting: Cardiology

## 2022-04-13 ENCOUNTER — Ambulatory Visit: Payer: Medicare HMO | Admitting: Radiation Oncology

## 2022-04-14 ENCOUNTER — Other Ambulatory Visit: Payer: Self-pay

## 2022-04-14 ENCOUNTER — Ambulatory Visit
Admission: RE | Admit: 2022-04-14 | Discharge: 2022-04-14 | Disposition: A | Discharge status: Home / Self Care | Payer: Medicare HMO | Source: Ambulatory Visit | Attending: Radiation Oncology | Admitting: Radiation Oncology

## 2022-04-14 ENCOUNTER — Encounter: Payer: Self-pay | Admitting: Radiation Oncology

## 2022-04-14 DIAGNOSIS — Z510 Encounter for antineoplastic radiation therapy: Secondary | ICD-10-CM | POA: Diagnosis not present

## 2022-04-14 LAB — RAD ONC ARIA SESSION SUMMARY
Course Elapsed Days: 6
Plan Fractions Treated to Date: 3
Plan Prescribed Dose Per Fraction: 18 Gy
Plan Total Fractions Prescribed: 3
Plan Total Prescribed Dose: 54 Gy
Reference Point Dosage Given to Date: 54 Gy
Reference Point Session Dosage Given: 18 Gy
Session Number: 3

## 2022-04-19 ENCOUNTER — Other Ambulatory Visit: Payer: Self-pay | Admitting: Radiation Oncology

## 2022-04-19 DIAGNOSIS — C3431 Malignant neoplasm of lower lobe, right bronchus or lung: Secondary | ICD-10-CM

## 2022-04-19 NOTE — Progress Notes (Signed)
  Radiation Oncology         [phone] ________________________________  Name: Stephanie [name] MRN: [id number]  Date: 04/14/2022  DOB: [birth year]  End of Treatment Note  Diagnosis:   Putative Stage IA2, cT1cN0M0, NSCLC of the RLL  Indication for treatment:  Curative       Radiation treatment dates:   04/08/22-04/14/22 SBRT Treatment  Site/dose:   The tumor in the RLL was treated with a course of stereotactic body radiation treatment. The patient received 54 Gy In 3 fractions at 18 G per fraction.  Narrative: The patient tolerated radiation treatment relatively well.   The patient did not have any signs of acute toxicity during treatment.  Plan: The patient will receive a call in about one month from the radiation oncology department. An order for a CT of the chest in 6-8 weeks has been planned. We will follow up with the results by phone, and if stable, anticipate another scan in 6 months per NCCN guidelines.      Carola Rhine, PAC

## 2022-05-06 ENCOUNTER — Other Ambulatory Visit: Payer: Self-pay

## 2022-05-06 MED ORDER — CARVEDILOL 6.25 MG PO TABS: 6.2500 mg | ORAL_TABLET | Freq: Two times a day (BID) | ORAL | 3 refills | Status: DC

## 2022-05-20 ENCOUNTER — Telehealth: Payer: Self-pay | Admitting: *Deleted

## 2022-05-20 NOTE — Telephone Encounter (Signed)
CALLED PATIENT TO INFORM OF CT FOR 05-27-22- ARRIVAL TIME- 6:45 PM @ [facility], NO RESTRICTIONS TO TEST, LVM FOR A RETURN CALL

## 2022-05-22 ENCOUNTER — Other Ambulatory Visit (HOSPITAL_BASED_OUTPATIENT_CLINIC_OR_DEPARTMENT_OTHER): Payer: Medicare HMO

## 2022-05-27 ENCOUNTER — Ambulatory Visit (HOSPITAL_BASED_OUTPATIENT_CLINIC_OR_DEPARTMENT_OTHER): Admission: RE | Admit: 2022-05-27 | Payer: Medicare HMO | Source: Ambulatory Visit

## 2022-05-31 ENCOUNTER — Ambulatory Visit
Admission: RE | Admit: 2022-05-31 | Discharge: 2022-05-31 | Disposition: A | Discharge status: Home / Self Care | Payer: Medicare HMO | Source: Ambulatory Visit | Attending: Radiation Oncology | Admitting: Radiation Oncology

## 2022-05-31 NOTE — Progress Notes (Signed)
  Radiation Oncology         ([phone]) [phone] ________________________________  Name: Stephanie [name] MRN: [id number]  Date of Service: 05/31/2022  DOB: 12-08-1935  Post Treatment Telephone Note  Diagnosis:  Putative Stage IA2, cT1cN0M0, NSCLC of the RLL   Indication for treatment:  Curative        Radiation treatment dates:   04/08/22-04/14/22 SBRT Treatment   Site/dose:   The tumor in the RLL was treated with a course of stereotactic body radiation treatment. The patient received 54 Gy In 3 fractions at 18 G per fraction.(as documented in provider EOT note)  The patient was not available for call today. Voicemail left.   The patient will follow-up w/ rad/onc PRN and was encouraged to call if she develops concerns or questions regarding radiation.   Ruel Favors, LPN

## 2022-06-02 ENCOUNTER — Encounter: Payer: Self-pay | Admitting: Cardiology

## 2022-06-20 ENCOUNTER — Other Ambulatory Visit: Payer: Self-pay | Admitting: Internal Medicine

## 2022-06-22 ENCOUNTER — Other Ambulatory Visit: Payer: Medicare HMO

## 2022-07-21 ENCOUNTER — Other Ambulatory Visit: Payer: Self-pay | Admitting: Cardiology

## 2022-09-07 ENCOUNTER — Other Ambulatory Visit: Payer: Self-pay

## 2022-09-07 ENCOUNTER — Emergency Department (HOSPITAL_COMMUNITY)
Admission: EM | Admit: 2022-09-07 | Discharge: 2022-09-07 | Disposition: A | Discharge status: Home / Self Care | Payer: Medicare HMO | Attending: Student | Admitting: Student

## 2022-09-07 ENCOUNTER — Emergency Department (HOSPITAL_COMMUNITY): Payer: Medicare HMO

## 2022-09-07 DIAGNOSIS — Z79899 Other long term (current) drug therapy: Secondary | ICD-10-CM | POA: Diagnosis not present

## 2022-09-07 DIAGNOSIS — N189 Chronic kidney disease, unspecified: Secondary | ICD-10-CM | POA: Insufficient documentation

## 2022-09-07 DIAGNOSIS — D72829 Elevated white blood cell count, unspecified: Secondary | ICD-10-CM | POA: Insufficient documentation

## 2022-09-07 DIAGNOSIS — J45909 Unspecified asthma, uncomplicated: Secondary | ICD-10-CM | POA: Diagnosis not present

## 2022-09-07 DIAGNOSIS — Z87891 Personal history of nicotine dependence: Secondary | ICD-10-CM | POA: Insufficient documentation

## 2022-09-07 DIAGNOSIS — J449 Chronic obstructive pulmonary disease, unspecified: Secondary | ICD-10-CM | POA: Diagnosis not present

## 2022-09-07 DIAGNOSIS — R911 Solitary pulmonary nodule: Secondary | ICD-10-CM | POA: Insufficient documentation

## 2022-09-07 DIAGNOSIS — R091 Pleurisy: Secondary | ICD-10-CM | POA: Insufficient documentation

## 2022-09-07 DIAGNOSIS — I129 Hypertensive chronic kidney disease with stage 1 through stage 4 chronic kidney disease, or unspecified chronic kidney disease: Secondary | ICD-10-CM | POA: Diagnosis not present

## 2022-09-07 DIAGNOSIS — E119 Type 2 diabetes mellitus without complications: Secondary | ICD-10-CM | POA: Diagnosis not present

## 2022-09-07 DIAGNOSIS — Z7984 Long term (current) use of oral hypoglycemic drugs: Secondary | ICD-10-CM | POA: Diagnosis not present

## 2022-09-07 DIAGNOSIS — R0602 Shortness of breath: Secondary | ICD-10-CM | POA: Diagnosis present

## 2022-09-07 LAB — CBC
HCT: 40 % (ref 36.0–46.0)
Hemoglobin: 12.7 g/dL (ref 12.0–15.0)
MCH: 27.5 pg (ref 26.0–34.0)
MCHC: 31.8 g/dL (ref 30.0–36.0)
MCV: 86.8 fL (ref 80.0–100.0)
Platelets: 246 10*3/uL (ref 150–400)
RBC: 4.61 MIL/uL (ref 3.87–5.11)
RDW: 14 % (ref 11.5–15.5)
WBC: 11.2 10*3/uL — ABNORMAL HIGH (ref 4.0–10.5)
nRBC: 0 % (ref 0.0–0.2)

## 2022-09-07 LAB — BASIC METABOLIC PANEL
Anion gap: 9 (ref 5–15)
BUN: 22 mg/dL (ref 8–23)
CO2: 29 mmol/L (ref 22–32)
Calcium: 10.3 mg/dL (ref 8.9–10.3)
Chloride: 99 mmol/L (ref 98–111)
Creatinine, Ser: 1.31 mg/dL — ABNORMAL HIGH (ref 0.44–1.00)
GFR, Estimated: 40 mL/min — ABNORMAL LOW (ref >60)
Glucose, Bld: 121 mg/dL — ABNORMAL HIGH (ref 70–99)
Potassium: 4.8 mmol/L (ref 3.5–5.1)
Sodium: 137 mmol/L (ref 135–145)

## 2022-09-07 LAB — BRAIN NATRIURETIC PEPTIDE: B Natriuretic Peptide: 127 pg/mL — ABNORMAL HIGH (ref 0.0–100.0)

## 2022-09-07 LAB — HEPATIC FUNCTION PANEL
ALT: 10 U/L (ref 0–44)
AST: 14 U/L — ABNORMAL LOW (ref 15–41)
Albumin: 3.6 g/dL (ref 3.5–5.0)
Alkaline Phosphatase: 116 U/L (ref 38–126)
Bilirubin, Direct: <0.1 mg/dL (ref 0.0–0.2)
Total Bilirubin: 0.5 mg/dL (ref 0.3–1.2)
Total Protein: 6.8 g/dL (ref 6.5–8.1)

## 2022-09-07 LAB — TROPONIN I (HIGH SENSITIVITY): Troponin I (High Sensitivity): 22 ng/L — ABNORMAL HIGH (ref <18)

## 2022-09-07 MED ORDER — LIDOCAINE 5 % EX PTCH
1.0000 | MEDICATED_PATCH | CUTANEOUS | Status: DC
  Administered 2022-09-07: 1 via TRANSDERMAL
  Filled 2022-09-07: qty 1

## 2022-09-07 MED ORDER — HYDROCODONE-ACETAMINOPHEN 5-325 MG PO TABS: 2.0000 | ORAL_TABLET | ORAL | 0 refills | Status: DC | PRN

## 2022-09-07 MED ORDER — IOHEXOL 350 MG/ML SOLN
75.0000 mL | Freq: Once | INTRAVENOUS | Status: AC | PRN
  Administered 2022-09-07: 75 mL via INTRAVENOUS

## 2022-09-07 MED ORDER — HYDROCODONE-ACETAMINOPHEN 5-325 MG PO TABS
1.0000 | ORAL_TABLET | Freq: Once | ORAL | Status: AC
  Administered 2022-09-07: 1 via ORAL
  Filled 2022-09-07: qty 1

## 2022-09-07 NOTE — ED Triage Notes (Signed)
Pt arrives from home via [facility] c/o SOB x1 day. Also c/o right rib pain when laying flat. 96% on RA in triage. NAD.   Hx of COPD and lung CA

## 2022-09-07 NOTE — ED Provider Notes (Signed)
[place] EMERGENCY DEPARTMENT AT [facility] Provider Note  CSN: [id number] Arrival date & time: 09/07/22 0220  Chief Complaint(s) Shortness of Breath  HPI Stephanie [name] is a 87 y.o. female with PMH COPD, CKD, paroxysmal A-fib, aortic stenosis status post TAVR, previous malignant lung nodule in the right lung base status post 3 rounds of radiation who presents emergency room for evaluation of pleuritic chest pain and shortness of breath.  She states that the pain began abruptly over the last 24 hours and hurts worse when lying flat and taking a deep breath.  No trauma to the chest.  Denies abdominal pain, nausea, vomiting, diaphoresis or any exertional component to the pain.   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 Excell Seltzer and Dr. Laneta Simmers.   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   DOE (dyspnea on exertion) 04/08/2020   Asthma 04/08/2020   DM (diabetes mellitus) (HCC) 04/08/2020   Severe aortic stenosis 04/08/2020   Symptomatic anemia 02/09/2019   Vitamin B12 deficiency    Uncontrolled type 2 diabetes mellitus with hyperglycemia (HCC) 07/06/2017   Essential hypertension, benign 07/06/2017   Mixed hyperlipidemia 07/06/2017   Class 2 severe  obesity due to excess calories with serious comorbidity and body mass index (BMI) of 36.0 to 36.9 in adult (HCC) 07/06/2017   Medial meniscus, posterior horn derangement 04/12/2012   OA (osteoarthritis) of knee 04/12/2012   Home Medication(s) Prior to Admission medications   Medication Sig Start Date End Date Taking? Authorizing Provider  HYDROcodone-acetaminophen (NORCO/VICODIN) 5-325 MG tablet Take 2 tablets by mouth every 4 (four) hours as needed. 09/07/22  Yes Dayne Chait, MD  albuterol (PROVENTIL) (2.5 MG/3ML) 0.083% nebulizer solution USE ONE AMPULE VIA NEBULIZER EVERY 4 HOURS AS NEEDED SHORTNESS OF BREATH OR WHEEZING 06/21/22   Nyoka Cowden, MD  azithromycin (ZITHROMAX) 250 MG tablet Take 250 mg by mouth as directed. 03/31/22   [provider]  BD PEN NEEDLE NANO U/F 32G X 4 MM MISC See admin instructions. 03/30/22   [provider]  budesonide (PULMICORT) 0.25 MG/2ML nebulizer solution One vial twice daily with albuterol 04/07/22   Nyoka Cowden, MD  carvedilol (COREG) 6.25 MG tablet Take 1 tablet (6.25 mg total) by mouth 2 (two) times daily. 05/06/22   Sharlene Dory, NP  ferrous sulfate 325 (65 FE) MG EC tablet Take 325 mg by mouth daily with breakfast.    [provider]  furosemide (LASIX) 40 MG tablet TAKE ONE TABLET BY MOUTH EVERY DAY 07/21/22   Jonelle Sidle, MD  gabapentin (NEURONTIN) 100 MG capsule Take 100-200 mg by mouth at bedtime. 10/02/19   [provider]  Insulin Detemir (LEVEMIR FLEXTOUCH) 100 UNIT/ML Pen Inject 30 Units into the skin at bedtime. Patient taking differently: Inject 40 Units into the skin at bedtime. 02/10/19   Johnson, Clanford L, MD  metFORMIN (GLUCOPHAGE) 500 MG tablet Take 1 tablet (500 mg total) by mouth 2 (two) times daily. 07/02/20   Janetta Hora, PA-C  neomycin-polymyxin-hydrocortisone (CORTISPORIN) 3.5-10000-1 OTIC suspension Place 4 drops into the left ear 3 (three) times daily. 02/25/22   [provider]                                                                                                                                    Past Surgical History Past Surgical History:  Procedure Laterality Date   APPENDECTOMY     BACK SURGERY     BRONCHIAL BIOPSY  03/02/2022   Procedure: BRONCHIAL BIOPSIES;  Surgeon: Josephine Igo, DO;  Location: [facility];  Service: Pulmonary;;   BRONCHIAL BRUSHINGS  03/02/2022   Procedure: BRONCHIAL BRUSHINGS;  Surgeon: Josephine Igo, DO;  Location: [facility];  Service: Pulmonary;;   BRONCHIAL NEEDLE ASPIRATION BIOPSY  03/02/2022   Procedure: BRONCHIAL NEEDLE ASPIRATION BIOPSIES;  Surgeon: Josephine Igo, DO;  Location: [facility];  Service: Pulmonary;;   CYST REMOVAL HAND     Right, hospital in [place]   FIDUCIAL MARKER PLACEMENT  03/02/2022   Procedure: FIDUCIAL MARKER PLACEMENT;  Surgeon: Josephine Igo, DO;  Location: [facility];  Service: Pulmonary;;   INTRAOPERATIVE TRANSTHORACIC ECHOCARDIOGRAM Left 07/01/2020   Procedure: INTRAOPERATIVE TRANSTHORACIC ECHOCARDIOGRAM;  Surgeon: Tonny Bollman, MD;  Location: [facility] OR;  Service: Open Heart Surgery;  Laterality: Left;   KNEE ARTHROSCOPY WITH MEDIAL MENISECTOMY Right 02/28/2012   Procedure: KNEE ARTHROSCOPY WITH MEDIAL MENISECTOMY;  Surgeon: Vickki Hearing, MD;  Location: [facility]S;  Service: Orthopedics;  Laterality: Right;   REPAIR KNEE LIGAMENT     RIGHT/LEFT HEART CATH AND CORONARY ANGIOGRAPHY N/A 04/10/2020   Procedure: RIGHT/LEFT HEART CATH AND CORONARY ANGIOGRAPHY;  Surgeon: Corky Crafts, MD;  Location: [facility] INVASIVE CV LAB;  Service: Cardiovascular;  Laterality: N/A;   TRANSCATHETER AORTIC VALVE REPLACEMENT, TRANSFEMORAL N/A 07/01/2020   Procedure: TRANSCATHETER AORTIC VALVE REPLACEMENT, TRANSFEMORAL;  Surgeon: Tonny Bollman, MD;  Location: [facility] OR;  Service: Open Heart Surgery;  Laterality: N/A;   ULTRASOUND GUIDANCE FOR VASCULAR ACCESS Bilateral 07/01/2020   Procedure:  ULTRASOUND GUIDANCE FOR VASCULAR ACCESS;  Surgeon: Tonny Bollman, MD;  Location[facility] OR;  Service: Open Heart Surgery;  Laterality: Bilateral;   Family History Family History  Problem Relation Age of Onset   Lung disease Other    Cancer Other    Arthritis Other    Asthma Other    Diabetes Other    Diabetes Mother    Hypertension Mother    Diverticulitis Mother    Diabetes Father    Hypertension Sister    Diabetes Brother    Colon cancer Neg Hx  Social History Social History   Tobacco Use   Smoking status: Former    Current packs/day: 1.00    Types: Cigarettes    Passive exposure: Never   Smokeless tobacco: Never   Tobacco comments:    Quit over 40 years ago.  Does not know how many years she smoked or start/stop date  Vaping Use   Vaping status: Never Used  Substance Use Topics   Alcohol use: No   Drug use: No   Allergies Amoxicillin, Penicillins, Sulfa antibiotics, and Nitrofurantoin  Review of Systems Review of Systems  Respiratory:  Positive for shortness of breath.   Cardiovascular:  Positive for chest pain.    Physical Exam Vital Signs  I have reviewed the triage vital signs BP (!) 142/103   Pulse 93   Temp 99 F (37.2 C) (Oral)   Resp (!) 25   Ht 5\' 5"  (1.651 m)   Wt 89 kg   SpO2 94%   BMI 32.65 kg/m   Physical Exam Vitals and nursing note reviewed.  Constitutional:      General: She is not in acute distress.    Appearance: She is well-developed.  HENT:     Head: Normocephalic and atraumatic.  Eyes:     Conjunctiva/sclera: Conjunctivae normal.  Cardiovascular:     Rate and Rhythm: Normal rate and regular rhythm.     Heart sounds: No murmur heard. Pulmonary:     Effort: Pulmonary effort is normal. No respiratory distress.     Breath sounds: Normal breath sounds.  Abdominal:     Palpations: Abdomen is soft.     Tenderness: There is no abdominal tenderness.  Musculoskeletal:        General: Tenderness present. No swelling.      Cervical back: Neck supple.  Skin:    General: Skin is warm and dry.     Capillary Refill: Capillary refill takes less than 2 seconds.  Neurological:     Mental Status: She is alert.  Psychiatric:        Mood and Affect: Mood normal.     ED Results and Treatments Labs (all labs ordered are listed, but only abnormal results are displayed) Labs Reviewed  BASIC METABOLIC PANEL - Abnormal; Notable for the following components:      Result Value   Glucose, Bld 121 (*)    Creatinine, Ser 1.31 (*)    GFR, Estimated 40 (*)    All other components within normal limits  CBC - Abnormal; Notable for the following components:   WBC 11.2 (*)    All other components within normal limits  BRAIN NATRIURETIC PEPTIDE - Abnormal; Notable for the following components:   B Natriuretic Peptide 127.0 (*)    All other components within normal limits  HEPATIC FUNCTION PANEL - Abnormal; Notable for the following components:   AST 14 (*)    All other components within normal limits  TROPONIN I (HIGH SENSITIVITY) - Abnormal; Notable for the following components:   Troponin I (High Sensitivity) 22 (*)    All other components within normal limits  Radiology CT CHEST ABDOMEN PELVIS W CONTRAST  Result Date: 09/07/2022 CLINICAL DATA:  87 year old female with history of right-sided rib pain. Suspected pneumonia on chest x-ray. History of lung cancer. * Tracking Code: BO * EXAM: CT CHEST, ABDOMEN, AND PELVIS WITH CONTRAST TECHNIQUE: Multidetector CT imaging of the chest, abdomen and pelvis was performed following the standard protocol during bolus administration of intravenous contrast. RADIATION DOSE REDUCTION: This exam was performed according to the departmental dose-optimization program which includes automated exposure control, adjustment of the mA and/or kV according to patient size and/or  use of iterative reconstruction technique. CONTRAST:  75mL OMNIPAQUE IOHEXOL 350 MG/ML SOLN COMPARISON:  Chest CT 02/12/2022. FINDINGS: CT CHEST FINDINGS Cardiovascular: Heart size is mildly enlarged. There is no significant pericardial fluid, thickening or pericardial calcification. There is aortic atherosclerosis, as well as atherosclerosis of the great vessels of the mediastinum and the coronary arteries, including calcified atherosclerotic plaque in the left main, left anterior descending, left circumflex and right coronary arteries. Status post TAVR. Mediastinum/Nodes: No pathologically enlarged mediastinal or hilar lymph nodes. Esophagus is unremarkable in appearance. No axillary lymphadenopathy. Lungs/Pleura: When compared to the prior examination, the previously noted right lower lobe pulmonary nodule has substantially enlarged (axial image 77 of series 4 and coronal image 82 of series 2) now a 3.7 x 3.9 x 4.3 cm mass which has both solid and sub solid components (central solid component measuring up to 2.4 cm on today's study). This lesion has now across the inferior aspect of the major fissure, with substantial involvement of the lateral segment of the right middle lobe which is new compared to the prior study. Fiducial marker from prior biopsy noted adjacent to the lesion in the right lower lobe. No other acute consolidative airspace disease. No pleural effusions. Scattered areas of mild pleural thickening are noted in the right hemithorax, including immediately adjacent to the lesion, which may account for the patient's right-sided rib pain. Subsegmental atelectasis or scarring in the inferior segment of the lingula. Musculoskeletal: There are no aggressive appearing lytic or blastic lesions noted in the visualized portions of the skeleton. CT ABDOMEN PELVIS FINDINGS Hepatobiliary: Multiple small low-attenuation lesions scattered throughout the hepatic parenchyma, many of which are too small to  definitively characterize, but statistically likely to represent cysts (no imaging follow-up recommended). The largest low-attenuation lesion in segment 2 of the liver (axial image 43 of series 3) measures 2.9 x 2.7 cm and is compatible with a simple cyst (no imaging follow-up recommended). Diffuse low attenuation throughout the hepatic parenchyma, indicative of a background of hepatic steatosis. No intra or extrahepatic biliary ductal dilatation. Gallbladder is unremarkable in appearance. Pancreas: No definite pancreatic mass or peripancreatic fluid collections or inflammatory changes are noted on today's noncontrast CT examination. Spleen: Unremarkable. Adrenals/Urinary Tract: Multiple low-attenuation lesions in the right kidney, compatible with simple cysts, largest of which measures 3.3 cm in the posterior aspect of the interpolar region. Other subcentimeter low-attenuation lesions in both kidneys, too small to definitively characterize, but statistically likely to represent small cysts (no imaging follow-up recommended). Right adrenal gland is normal in appearance. 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 No hydroureteronephrosis. Urinary bladder is unremarkable in appearance. Stomach/Bowel: The appearance of the stomach is normal. There is no pathologic dilatation of small bowel or colon. Numerous colonic diverticula are noted, without surrounding inflammatory changes to suggest an acute diverticulitis at this time. The appendix is  not confidently identified and may be surgically absent. Regardless, there are no inflammatory changes noted adjacent to the cecum to suggest the presence of an acute appendicitis at this time. Vascular/Lymphatic: Atherosclerosis in the abdominal aorta and pelvic vasculature, without definite aneurysm or dissection. No lymphadenopathy noted in the abdomen or pelvis. Reproductive: Uterus and  ovaries are atrophic. Other: No significant volume of ascites.  No pneumoperitoneum. Musculoskeletal: Status post PLIF from L2-S1. There are no aggressive appearing lytic or blastic lesions noted in the visualized portions of the skeleton. IMPRESSION: 1. No definite acute findings are noted in the chest, abdomen or pelvis. However, the previously noted right lower lobe pulmonary nodule has substantially enlarged, now a mixed solid and sub solid mass overall measuring 3.7 x 3.9 x 4.3 cm with solid component measuring up to 2.4 cm, which now crosses the inferior aspect of the major fissure involving the lateral segment of the right middle lobe, and extending to the overlying pleura laterally which appears mildly thickened, which may account for the patient's right-sided chest pain. No lymphadenopathy or definite signs of metastatic disease outside of the thorax at this time. 2. Cardiomegaly. 3. Aortic atherosclerosis, in addition to left main and three-vessel coronary artery disease. 4. Hepatic steatosis. 5. Stable left adrenal nodule compared to several prior examinations, likely a benign lesions such as a lipid poor adenoma. 6. Colonic diverticulosis without evidence of acute diverticulitis at this time. 7. Additional incidental findings, as above. Electronically Signed   By: Trudie Reed M.D.   On: 09/07/2022 05:55   DG Chest 2 View  Result Date: 09/07/2022 CLINICAL DATA:  Shortness of breath and right rib pain. History of COPD and lung cancer. EXAM: CHEST - 2 VIEW COMPARISON:  Chest radiograph 03/02/2022 FINDINGS: Stable cardiomegaly. Aortic atherosclerotic calcification. TAVR. Fiducial marker in the right lower lung. Associated airspace opacity compatible with subsolid right lower lobe pulmonary nodule. This appears increased compared to 03/02/2022. Reticulonodular opacities in the right lower lung. No pleural effusion or pneumothorax. IMPRESSION: 1. Reticular nodule opacities in the right lower lung may  be due to atypical infection or aspiration. 2. Increased size of the airspace opacity associated with the fiducial marker in the right lower lung. Electronically Signed   By: Minerva Fester M.D.   On: 09/07/2022 03:12    Pertinent labs & imaging results that were available during my care of the patient were reviewed by me and considered in my medical decision making (see MDM for details).  Medications Ordered in ED Medications  iohexol (OMNIPAQUE) 350 MG/ML injection 75 mL (75 mLs Intravenous Contrast Given 09/07/22 0458)  HYDROcodone-acetaminophen (NORCO/VICODIN) 5-325 MG per tablet 1 tablet (1 tablet Oral Given 09/07/22 2542)                                                                                                                                     Procedures Procedures  (including critical care time)  Medical Decision Making / ED Course   This patient presents to the ED for concern of shortness of breath, pleurisy, this involves an extensive number of treatment options, and is a complaint that carries with it a high risk of complications and morbidity.  The differential diagnosis includes pneumonia, progression of lung cancer, cholecystitis, choledocholithiasis, pancreatitis, pneumothorax, ACS  MDM: Patient seen emergency room for evaluation of pleuritic chest pain and shortness of breath.  Physical exam with significant tenderness near the false rib on the right and in the right upper quadrant of the abdomen.  Laboratory evaluation with a creatinine of 1.31, leukocytosis to 11.2, high-sensitivity troponin 22, BNP elevated to 127.0, HFP unremarkable, chest x-ray with opacities in the right lower lung that may be atypical infection or aspiration as well as an increase in the size of the airspace opacity associated with the fiducial marker in the right lower lung.  Follow-up CT imaging expanded to CT chest abdomen pelvis to better visualize gallbladder given physical exam and evaluate the  abnormal chest x-ray.  CT imaging showing that the right lower lobe pulmonary nodule has significantly enlarged extending to the overlying pleura and may be causing her pleuritic chest pain.  I spoke with the pulmonologist on-call Dr. Judeth Horn who agrees that this patient will need closer outpatient pulmonology follow-up.  I sent a message to both Dr. Tonia Brooms of pulmonology and Dr. Mitzi Hansen of radiation oncology and they will help arrange outpatient follow-up with the patient.  Patient given pain control and will be discharged with outpatient pulmonology follow-up.   Additional history obtained: -Additional history obtained from daughter -External records from outside source obtained and reviewed including: Chart review including previous notes, labs, imaging, consultation notes   Lab Tests: -I ordered, reviewed, and interpreted labs.   The pertinent results include:   Labs Reviewed  BASIC METABOLIC PANEL - Abnormal; Notable for the following components:      Result Value   Glucose, Bld 121 (*)    Creatinine, Ser 1.31 (*)    GFR, Estimated 40 (*)    All other components within normal limits  CBC - Abnormal; Notable for the following components:   WBC 11.2 (*)    All other components within normal limits  BRAIN NATRIURETIC PEPTIDE - Abnormal; Notable for the following components:   B Natriuretic Peptide 127.0 (*)    All other components within normal limits  HEPATIC FUNCTION PANEL - Abnormal; Notable for the following components:   AST 14 (*)    All other components within normal limits  TROPONIN I (HIGH SENSITIVITY) - Abnormal; Notable for the following components:   Troponin I (High Sensitivity) 22 (*)    All other components within normal limits      EKG   EKG Interpretation Date/Time:  Tuesday September 07 2022 02:32:09 EDT Ventricular Rate:  77 PR Interval:  198 QRS Duration:  78 QT Interval:  346 QTC Calculation: 391 R Axis:   -52  Text Interpretation: Sinus rhythm with  Premature supraventricular complexes Left axis deviation When compared with ECG of 13-Oct-2021 14:18, No significant change was found Confirmed by Shanai Lartigue ([id number]) on 09/07/2022 4:08:05 AM         Imaging Studies ordered: I ordered imaging studies including chest x-ray, CT CAP I independently visualized and interpreted imaging. I agree with the radiologist interpretation   Medicines ordered and prescription drug management: Meds ordered this encounter  Medications   DISCONTD: lidocaine (LIDODERM) 5 % 1 patch   iohexol (OMNIPAQUE)  350 MG/ML injection 75 mL   HYDROcodone-acetaminophen (NORCO/VICODIN) 5-325 MG per tablet 1 tablet   HYDROcodone-acetaminophen (NORCO/VICODIN) 5-325 MG tablet    Sig: Take 2 tablets by mouth every 4 (four) hours as needed.    Dispense:  10 tablet    Refill:  0    -I have reviewed the patients home medicines and have made adjustments as needed  Critical interventions none  Consultations Obtained: I requested consultation with the pulmonologist on-call Dr. Judeth Horn,  and discussed lab and imaging findings as well as pertinent plan - they recommend: Outpatient pulmonology follow-up   Cardiac Monitoring: The patient was maintained on a cardiac monitor.  I personally viewed and interpreted the cardiac monitored which showed an underlying rhythm of: N SR  Social Determinants of Health:  Factors impacting patients care include: Previously may have missed follow-up appointments because her daughter's phone number has changed, did update this in chart   Reevaluation: After the interventions noted above, I reevaluated the patient and found that they have :improved  Co morbidities that complicate the patient evaluation  Past Medical History:  Diagnosis Date   Arthritis    Asthma    CKD (chronic kidney disease)    COPD (chronic obstructive pulmonary disease) (HCC)    Essential hypertension    Heart murmur    Mixed hyperlipidemia    PAF  (paroxysmal atrial fibrillation) (HCC)    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 Excell Seltzer and Dr. Laneta Simmers.   Severe aortic stenosis    Symptomatic anemia    Presumed GI bleed January 2021 - she declined GI work-up   Type 2 diabetes mellitus (HCC)       Dispostion: I considered admission for this patient, but at this time she does not meet inpatient criteria for admission and she is safe for discharge with outpatient follow-up     Final Clinical Impression(s) / ED Diagnoses Final diagnoses:  Lung nodule  Pleurisy     @PCDICTATION @    Glendora Score, MD 09/07/22 [phone]

## 2022-09-07 NOTE — ED Notes (Signed)
Urine in room if needed.

## 2022-09-07 NOTE — ED Notes (Signed)
ED Provider at bedside. 

## 2022-09-08 ENCOUNTER — Encounter: Payer: Self-pay | Admitting: Pulmonary Disease

## 2022-09-08 ENCOUNTER — Ambulatory Visit: Payer: Medicare HMO | Admitting: Pulmonary Disease

## 2022-09-08 VITALS — BP 140/90 | HR 65 | Ht 65.0 in | Wt 194.6 lb

## 2022-09-08 DIAGNOSIS — J449 Chronic obstructive pulmonary disease, unspecified: Secondary | ICD-10-CM | POA: Diagnosis not present

## 2022-09-08 DIAGNOSIS — R911 Solitary pulmonary nodule: Secondary | ICD-10-CM | POA: Diagnosis not present

## 2022-09-08 NOTE — Progress Notes (Signed)
Synopsis: Referred in August 2024 for pulmonary nodule by [facility]*  Subjective:   PATIENT ID: Stephanie [name] GENDER: female DOB: 1935-03-20, MRN: [id number]  Chief Complaint  Patient presents with   Consult    Consult for lung nodule.    This is a 87 year old female, past medical history of CKD COPD, TAVR in 2022.Patient was sent for bronchoscopy diagnosed with a stage I lung cancer in February 2024.  Was referred to radiation oncology it revealed atypical cells at the time of biopsy but was referred for radiation treatment options.  Decision was made for moving forward with SBRT.  Patient underwent SBRT treatments by Dr. Mitzi Hansen.  She completed these in May.  Patient was seen in the ER yesterday with chest pains.  CT imaging of the chest was complete which shows increased subsolid density and nodule in the same location as the previous lesion.  The concern of potential now for pleural involvement that may be causing some of her pains.    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 Excell Seltzer and Dr. Laneta Simmers.   Severe aortic stenosis    Symptomatic anemia    Presumed GI bleed January 2021 - she declined GI work-up   Type 2 diabetes mellitus (HCC)      Family History  Problem Relation Age of Onset   Lung disease Other    Cancer Other    Arthritis Other    Asthma Other    Diabetes Other    Diabetes Mother    Hypertension Mother    Diverticulitis Mother    Diabetes Father    Hypertension Sister    Diabetes Brother    Colon cancer Neg Hx      Past Surgical History:  Procedure Laterality Date   APPENDECTOMY     BACK  SURGERY     BRONCHIAL BIOPSY  03/02/2022   Procedure: BRONCHIAL BIOPSIES;  Surgeon: Josephine Igo, DO;  Location: [facility];  Service: Pulmonary;;   BRONCHIAL BRUSHINGS  03/02/2022   Procedure: BRONCHIAL BRUSHINGS;  Surgeon: Josephine Igo, DO;  Location: [facility];  Service: Pulmonary;;   BRONCHIAL NEEDLE ASPIRATION BIOPSY  03/02/2022   Procedure: BRONCHIAL NEEDLE ASPIRATION BIOPSIES;  Surgeon: Josephine Igo, DO;  Location: [facility];  Service: Pulmonary;;   CYST REMOVAL HAND     Right, hospital in [place]   FIDUCIAL MARKER PLACEMENT  03/02/2022   Procedure: FIDUCIAL MARKER PLACEMENT;  Surgeon: Josephine Igo, DO;  Location: [facility];  Service: Pulmonary;;   INTRAOPERATIVE TRANSTHORACIC ECHOCARDIOGRAM Left 07/01/2020   Procedure: INTRAOPERATIVE TRANSTHORACIC ECHOCARDIOGRAM;  Surgeon: Tonny Bollman, MD;  Location: [facility] OR;  Service: Open Heart Surgery;  Laterality: Left;   KNEE ARTHROSCOPY WITH MEDIAL MENISECTOMY Right 02/28/2012   Procedure: KNEE ARTHROSCOPY WITH MEDIAL MENISECTOMY;  Surgeon: Vickki Hearing, MD;  Location: [facility];  Service: Orthopedics;  Laterality: Right;   REPAIR KNEE LIGAMENT     RIGHT/LEFT HEART CATH AND CORONARY ANGIOGRAPHY N/A 04/10/2020   Procedure: RIGHT/LEFT HEART CATH AND CORONARY ANGIOGRAPHY;  Surgeon: Corky Crafts, MD;  Location: [facility]  INVASIVE CV LAB;  Service: Cardiovascular;  Laterality: N/A;   TRANSCATHETER AORTIC VALVE REPLACEMENT, TRANSFEMORAL N/A 07/01/2020   Procedure: TRANSCATHETER AORTIC VALVE REPLACEMENT, TRANSFEMORAL;  Surgeon: Tonny Bollman, MD;  Location[facility] OR;  Service: Open Heart Surgery;  Laterality: N/A;   ULTRASOUND GUIDANCE FOR VASCULAR ACCESS Bilateral 07/01/2020   Procedure: ULTRASOUND GUIDANCE FOR VASCULAR ACCESS;  Surgeon: Tonny Bollman, MD;  Location: [facility] OR;  Service: Open Heart Surgery;  Laterality: Bilateral;    Social History   Socioeconomic History   Marital status: Divorced    Spouse name: Not on file    Number of children: 3   Years of education: Not on file   Highest education level: Not on file  Occupational History   Occupation: Retired-cook   Tobacco Use   Smoking status: Former    Current packs/day: 1.00    Types: Cigarettes    Passive exposure: Never   Smokeless tobacco: Never   Tobacco comments:    Quit over 40 years ago.  Does not know how many years she smoked or start/stop date  Vaping Use   Vaping status: Never Used  Substance and Sexual Activity   Alcohol use: No   Drug use: No   Sexual activity: Never  Other Topics Concern   Not on file  Social History Narrative   ** Merged History Encounter **       Social Determinants of Health   Financial Resource Strain: Not on file  Food Insecurity: No Food Insecurity (03/12/2022)   Hunger Vital Sign    Worried About Running Out of Food in the Last Year: Never true    Ran Out of Food in the Last Year: Never true  Transportation Needs: No Transportation Needs (03/12/2022)   PRAPARE - Administrator, Civil Service (Medical): No    Lack of Transportation (Non-Medical): No  Physical Activity: Not on file  Stress: Not on file  Social Connections: Not on file  Intimate Partner Violence: Not At Risk (03/12/2022)   Humiliation, Afraid, Rape, and Kick questionnaire    Fear of Current or Ex-Partner: No    Emotionally Abused: No    Physically Abused: No    Sexually Abused: No     Allergies  Allergen Reactions   Amoxicillin Swelling    Tongue swelling    Penicillins Swelling    Has patient had a PCN reaction causing immediate rash, facial/tongue/throat swelling, SOB or lightheadedness with hypotension: No Has patient had a PCN reaction causing severe rash involving mucus membranes or skin necrosis: No Has patient had a PCN reaction that required hospitalization: No Has patient had a PCN reaction occurring within the last 10 years: No If all of the above answers are "NO", then may proceed with Cephalosporin  use.    Sulfa Antibiotics Swelling    Lip swelling   Nitrofurantoin Nausea And Vomiting     Outpatient Medications Prior to Visit  Medication Sig Dispense Refill   albuterol (PROVENTIL) (2.5 MG/3ML) 0.083% nebulizer solution USE ONE AMPULE VIA NEBULIZER EVERY 4 HOURS AS NEEDED SHORTNESS OF BREATH OR WHEEZING 75 mL 3   BD PEN NEEDLE NANO U/F 32G X 4 MM MISC See admin instructions.     budesonide (PULMICORT) 0.25 MG/2ML nebulizer solution One vial twice daily with albuterol 120 mL 12   carvedilol (COREG) 6.25 MG tablet Take 1 tablet (6.25 mg total) by mouth 2 (two) times daily. 180 tablet 3   ferrous sulfate 325 (65 FE) MG EC tablet Take  325 mg by mouth daily with breakfast.     furosemide (LASIX) 40 MG tablet TAKE ONE TABLET BY MOUTH EVERY DAY 90 tablet 1   gabapentin (NEURONTIN) 100 MG capsule Take 100-200 mg by mouth at bedtime.     HYDROcodone-acetaminophen (NORCO/VICODIN) 5-325 MG tablet Take 2 tablets by mouth every 4 (four) hours as needed. 10 tablet 0   Insulin Detemir (LEVEMIR FLEXTOUCH) 100 UNIT/ML Pen Inject 30 Units into the skin at bedtime. (Patient taking differently: Inject 40 Units into the skin at bedtime.) 15 mL 11   metFORMIN (GLUCOPHAGE) 500 MG tablet Take 1 tablet (500 mg total) by mouth 2 (two) times daily.     neomycin-polymyxin-hydrocortisone (CORTISPORIN) 3.5-10000-1 OTIC suspension Place 4 drops into the left ear 3 (three) times daily.     azithromycin (ZITHROMAX) 250 MG tablet Take 250 mg by mouth as directed. (Patient not taking: Reported on 09/08/2022)     No facility-administered medications prior to visit.    Review of Systems  Constitutional:  Negative for chills, fever, malaise/fatigue and weight loss.  HENT:  Negative for hearing loss, sore throat and tinnitus.   Eyes:  Negative for blurred vision and double vision.  Respiratory:  Negative for cough, hemoptysis, sputum production, shortness of breath, wheezing and stridor.   Cardiovascular:  Negative for  chest pain, palpitations, orthopnea, leg swelling and PND.  Gastrointestinal:  Negative for abdominal pain, constipation, diarrhea, heartburn, nausea and vomiting.  Genitourinary:  Negative for dysuria, hematuria and urgency.  Musculoskeletal:  Negative for joint pain and myalgias.  Skin:  Negative for itching and rash.  Neurological:  Negative for dizziness, tingling, weakness and headaches.  Endo/Heme/Allergies:  Negative for environmental allergies. Does not bruise/bleed easily.  Psychiatric/Behavioral:  Negative for depression. The patient is not nervous/anxious and does not have insomnia.   All other systems reviewed and are negative.    Objective:  Physical Exam Vitals reviewed.  Constitutional:      General: She is not in acute distress.    Appearance: She is well-developed.  HENT:     Head: Normocephalic and atraumatic.  Eyes:     General: No scleral icterus.    Conjunctiva/sclera: Conjunctivae normal.     Pupils: Pupils are equal, round, and reactive to light.  Neck:     Vascular: No JVD.     Trachea: No tracheal deviation.  Cardiovascular:     Rate and Rhythm: Normal rate and regular rhythm.     Heart sounds: Normal heart sounds. No murmur heard. Pulmonary:     Effort: Pulmonary effort is normal. No tachypnea, accessory muscle usage or respiratory distress.     Breath sounds: No stridor. No wheezing, rhonchi or rales.  Abdominal:     General: There is no distension.     Palpations: Abdomen is soft.     Tenderness: There is no abdominal tenderness.  Musculoskeletal:        General: No tenderness.     Cervical back: Neck supple.  Lymphadenopathy:     Cervical: No cervical adenopathy.  Skin:    General: Skin is warm and dry.     Capillary Refill: Capillary refill takes less than 2 seconds.     Findings: No rash.  Neurological:     Mental Status: She is alert and oriented to person, place, and time.  Psychiatric:        Behavior: Behavior normal.       Vitals:   09/08/22 0939  BP: (!) 140/90  Pulse: 65  SpO2: 95%  Weight: 194 lb 9.6 oz (88.3 kg)  Height: 5\' 5"  (1.651 m)   95% on RA BMI Readings from Last 3 Encounters:  09/08/22 32.38 kg/m  09/07/22 32.65 kg/m  04/12/22 32.62 kg/m   Wt Readings from Last 3 Encounters:  09/08/22 194 lb 9.6 oz (88.3 kg)  09/07/22 196 lb 3.4 oz (89 kg)  04/12/22 196 lb (88.9 kg)     CBC    Component Value Date/Time   WBC 11.2 (H) 09/07/2022 0329   RBC 4.61 09/07/2022 0329   HGB 12.7 09/07/2022 0329   HGB 12.3 07/20/2021 1324   HCT 40.0 09/07/2022 0329   HCT 38.5 07/20/2021 1324   PLT 246 09/07/2022 0329   PLT 232 07/20/2021 1324   MCV 86.8 09/07/2022 0329   MCV 85 07/20/2021 1324   MCH 27.5 09/07/2022 0329   MCHC 31.8 09/07/2022 0329   RDW 14.0 09/07/2022 0329   RDW 14.9 07/20/2021 1324   LYMPHSABS 1.4 10/13/2021 1808   MONOABS 0.8 10/13/2021 1808   EOSABS 0.4 10/13/2021 1808   BASOSABS 0.0 10/13/2021 1808     Chest Imaging:  CT chest 09/07/2022: Enlarging right sided pulmonary nodule with area of groundglass. Concern for persistent or recurrence of malignancy. Following SBRT. The patient's images have been independently reviewed by me.    Pulmonary Functions Testing Results:    Latest Ref Rng & Units 02/05/2022    2:45 PM  PFT Results  FVC-Pre L 1.24   FVC-Predicted Pre % 48   FVC-Post L 1.30   FVC-Predicted Post % 50   Pre FEV1/FVC % % 71   Post FEV1/FCV % % 70   FEV1-Pre L 0.87   FEV1-Predicted Pre % 46   FEV1-Post L 0.91   DLCO uncorrected ml/min/mmHg 22.14   DLCO UNC% % 115   DLCO corrected ml/min/mmHg 22.14   DLCO COR %Predicted % 115   DLVA Predicted % 117   TLC L 4.16   TLC % Predicted % 79   RV % Predicted % 102     FeNO:   Pathology:   Echocardiogram:   Heart Catheterization:     Assessment & Plan:     ICD-10-CM   1. Lung nodule  R91.1 Procedural/ Surgical Case Request: ROBOTIC ASSISTED NAVIGATIONAL BRONCHOSCOPY    Ambulatory  referral to Pulmonology    2. COPD GOLD 3 if use FEV1/VC  J44.9     3. Solitary pulmonary nodule on lung CT  R91.1       Discussion:  This is a 87 year old female history of a likely stage I malignancy was diagnosed with atypical cells on previous biopsy and decision was made for SBRT treatments.  Plan: I have messaged radiation oncology to let them know the findings of the recent CT results. I do think we probably need to move forward with repeat bronchoscopy and biopsy. Patient is agreeable to this plan.  She did undergo procedure in February tolerated this fine. I do think that potentially dealing with recurrent cancer.  And or the same malignancy that we had before. We will also set up for the same procedure with robotic assisted navigational bronchoscopy. Tentative bronchoscopy date we on 09/14/2022. If this date does not work we can probably use 09/16/2022 or the following week on Tuesday in September. Tentative bronchoscopy date will also depend on her 1 week follow-up with SG, and fever post bronchoscopy follow-up.    Current Outpatient Medications:    albuterol (PROVENTIL) (2.5 MG/3ML) 0.083% nebulizer  solution, USE ONE AMPULE VIA NEBULIZER EVERY 4 HOURS AS NEEDED SHORTNESS OF BREATH OR WHEEZING, Disp: 75 mL, Rfl: 3   BD PEN NEEDLE NANO U/F 32G X 4 MM MISC, See admin instructions., Disp: , Rfl:    budesonide (PULMICORT) 0.25 MG/2ML nebulizer solution, One vial twice daily with albuterol, Disp: 120 mL, Rfl: 12   carvedilol (COREG) 6.25 MG tablet, Take 1 tablet (6.25 mg total) by mouth 2 (two) times daily., Disp: 180 tablet, Rfl: 3   ferrous sulfate 325 (65 FE) MG EC tablet, Take 325 mg by mouth daily with breakfast., Disp: , Rfl:    furosemide (LASIX) 40 MG tablet, TAKE ONE TABLET BY MOUTH EVERY DAY, Disp: 90 tablet, Rfl: 1   gabapentin (NEURONTIN) 100 MG capsule, Take 100-200 mg by mouth at bedtime., Disp: , Rfl:    HYDROcodone-acetaminophen (NORCO/VICODIN) 5-325 MG tablet,  Take 2 tablets by mouth every 4 (four) hours as needed., Disp: 10 tablet, Rfl: 0   Insulin Detemir (LEVEMIR FLEXTOUCH) 100 UNIT/ML Pen, Inject 30 Units into the skin at bedtime. (Patient taking differently: Inject 40 Units into the skin at bedtime.), Disp: 15 mL, Rfl: 11   metFORMIN (GLUCOPHAGE) 500 MG tablet, Take 1 tablet (500 mg total) by mouth 2 (two) times daily., Disp: , Rfl:    neomycin-polymyxin-hydrocortisone (CORTISPORIN) 3.5-10000-1 OTIC suspension, Place 4 drops into the left ear 3 (three) times daily., Disp: , Rfl:    azithromycin (ZITHROMAX) 250 MG tablet, Take 250 mg by mouth as directed. (Patient not taking: Reported on 09/08/2022), Disp: , Rfl:    Josephine Igo, DO [facility] 09/08/2022 10:00 AM

## 2022-09-08 NOTE — H&P (View-Only) (Signed)
 Synopsis: Referred in August 2024 for pulmonary nodule by [facility]*  Subjective:   PATIENT ID: Stephanie [name] GENDER: female DOB: 1935-03-20, MRN: [id number]  Chief Complaint  Patient presents with   Consult    Consult for lung nodule.    This is a 87 year old female, past medical history of CKD COPD, TAVR in 2022.Patient was sent for bronchoscopy diagnosed with a stage I lung cancer in February 2024.  Was referred to radiation oncology it revealed atypical cells at the time of biopsy but was referred for radiation treatment options.  Decision was made for moving forward with SBRT.  Patient underwent SBRT treatments by Dr. Mitzi Hansen.  She completed these in May.  Patient was seen in the ER yesterday with chest pains.  CT imaging of the chest was complete which shows increased subsolid density and nodule in the same location as the previous lesion.  The concern of potential now for pleural involvement that may be causing some of her pains.    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 Excell Seltzer and Dr. Laneta Simmers.   Severe aortic stenosis    Symptomatic anemia    Presumed GI bleed January 2021 - she declined GI work-up   Type 2 diabetes mellitus (HCC)      Family History  Problem Relation Age of Onset   Lung disease Other    Cancer Other    Arthritis Other    Asthma Other    Diabetes Other    Diabetes Mother    Hypertension Mother    Diverticulitis Mother    Diabetes Father    Hypertension Sister    Diabetes Brother    Colon cancer Neg Hx      Past Surgical History:  Procedure Laterality Date   APPENDECTOMY     BACK  SURGERY     BRONCHIAL BIOPSY  03/02/2022   Procedure: BRONCHIAL BIOPSIES;  Surgeon: Josephine Igo, DO;  Location: [facility];  Service: Pulmonary;;   BRONCHIAL BRUSHINGS  03/02/2022   Procedure: BRONCHIAL BRUSHINGS;  Surgeon: Josephine Igo, DO;  Location: [facility];  Service: Pulmonary;;   BRONCHIAL NEEDLE ASPIRATION BIOPSY  03/02/2022   Procedure: BRONCHIAL NEEDLE ASPIRATION BIOPSIES;  Surgeon: Josephine Igo, DO;  Location: [facility];  Service: Pulmonary;;   CYST REMOVAL HAND     Right, hospital in [place]   FIDUCIAL MARKER PLACEMENT  03/02/2022   Procedure: FIDUCIAL MARKER PLACEMENT;  Surgeon: Josephine Igo, DO;  Location: [facility];  Service: Pulmonary;;   INTRAOPERATIVE TRANSTHORACIC ECHOCARDIOGRAM Left 07/01/2020   Procedure: INTRAOPERATIVE TRANSTHORACIC ECHOCARDIOGRAM;  Surgeon: Tonny Bollman, MD;  Location: [facility] OR;  Service: Open Heart Surgery;  Laterality: Left;   KNEE ARTHROSCOPY WITH MEDIAL MENISECTOMY Right 02/28/2012   Procedure: KNEE ARTHROSCOPY WITH MEDIAL MENISECTOMY;  Surgeon: Vickki Hearing, MD;  Location: [facility];  Service: Orthopedics;  Laterality: Right;   REPAIR KNEE LIGAMENT     RIGHT/LEFT HEART CATH AND CORONARY ANGIOGRAPHY N/A 04/10/2020   Procedure: RIGHT/LEFT HEART CATH AND CORONARY ANGIOGRAPHY;  Surgeon: Corky Crafts, MD;  Location: [facility]  INVASIVE CV LAB;  Service: Cardiovascular;  Laterality: N/A;   TRANSCATHETER AORTIC VALVE REPLACEMENT, TRANSFEMORAL N/A 07/01/2020   Procedure: TRANSCATHETER AORTIC VALVE REPLACEMENT, TRANSFEMORAL;  Surgeon: Tonny Bollman, MD;  Location[facility] OR;  Service: Open Heart Surgery;  Laterality: N/A;   ULTRASOUND GUIDANCE FOR VASCULAR ACCESS Bilateral 07/01/2020   Procedure: ULTRASOUND GUIDANCE FOR VASCULAR ACCESS;  Surgeon: Tonny Bollman, MD;  Location: [facility] OR;  Service: Open Heart Surgery;  Laterality: Bilateral;    Social History   Socioeconomic History   Marital status: Divorced    Spouse name: Not on file    Number of children: 3   Years of education: Not on file   Highest education level: Not on file  Occupational History   Occupation: Retired-cook   Tobacco Use   Smoking status: Former    Current packs/day: 1.00    Types: Cigarettes    Passive exposure: Never   Smokeless tobacco: Never   Tobacco comments:    Quit over 40 years ago.  Does not know how many years she smoked or start/stop date  Vaping Use   Vaping status: Never Used  Substance and Sexual Activity   Alcohol use: No   Drug use: No   Sexual activity: Never  Other Topics Concern   Not on file  Social History Narrative   ** Merged History Encounter **       Social Determinants of Health   Financial Resource Strain: Not on file  Food Insecurity: No Food Insecurity (03/12/2022)   Hunger Vital Sign    Worried About Running Out of Food in the Last Year: Never true    Ran Out of Food in the Last Year: Never true  Transportation Needs: No Transportation Needs (03/12/2022)   PRAPARE - Administrator, Civil Service (Medical): No    Lack of Transportation (Non-Medical): No  Physical Activity: Not on file  Stress: Not on file  Social Connections: Not on file  Intimate Partner Violence: Not At Risk (03/12/2022)   Humiliation, Afraid, Rape, and Kick questionnaire    Fear of Current or Ex-Partner: No    Emotionally Abused: No    Physically Abused: No    Sexually Abused: No     Allergies  Allergen Reactions   Amoxicillin Swelling    Tongue swelling    Penicillins Swelling    Has patient had a PCN reaction causing immediate rash, facial/tongue/throat swelling, SOB or lightheadedness with hypotension: No Has patient had a PCN reaction causing severe rash involving mucus membranes or skin necrosis: No Has patient had a PCN reaction that required hospitalization: No Has patient had a PCN reaction occurring within the last 10 years: No If all of the above answers are "NO", then may proceed with Cephalosporin  use.    Sulfa Antibiotics Swelling    Lip swelling   Nitrofurantoin Nausea And Vomiting     Outpatient Medications Prior to Visit  Medication Sig Dispense Refill   albuterol (PROVENTIL) (2.5 MG/3ML) 0.083% nebulizer solution USE ONE AMPULE VIA NEBULIZER EVERY 4 HOURS AS NEEDED SHORTNESS OF BREATH OR WHEEZING 75 mL 3   BD PEN NEEDLE NANO U/F 32G X 4 MM MISC See admin instructions.     budesonide (PULMICORT) 0.25 MG/2ML nebulizer solution One vial twice daily with albuterol 120 mL 12   carvedilol (COREG) 6.25 MG tablet Take 1 tablet (6.25 mg total) by mouth 2 (two) times daily. 180 tablet 3   ferrous sulfate 325 (65 FE) MG EC tablet Take  325 mg by mouth daily with breakfast.     furosemide (LASIX) 40 MG tablet TAKE ONE TABLET BY MOUTH EVERY DAY 90 tablet 1   gabapentin (NEURONTIN) 100 MG capsule Take 100-200 mg by mouth at bedtime.     HYDROcodone-acetaminophen (NORCO/VICODIN) 5-325 MG tablet Take 2 tablets by mouth every 4 (four) hours as needed. 10 tablet 0   Insulin Detemir (LEVEMIR FLEXTOUCH) 100 UNIT/ML Pen Inject 30 Units into the skin at bedtime. (Patient taking differently: Inject 40 Units into the skin at bedtime.) 15 mL 11   metFORMIN (GLUCOPHAGE) 500 MG tablet Take 1 tablet (500 mg total) by mouth 2 (two) times daily.     neomycin-polymyxin-hydrocortisone (CORTISPORIN) 3.5-10000-1 OTIC suspension Place 4 drops into the left ear 3 (three) times daily.     azithromycin (ZITHROMAX) 250 MG tablet Take 250 mg by mouth as directed. (Patient not taking: Reported on 09/08/2022)     No facility-administered medications prior to visit.    Review of Systems  Constitutional:  Negative for chills, fever, malaise/fatigue and weight loss.  HENT:  Negative for hearing loss, sore throat and tinnitus.   Eyes:  Negative for blurred vision and double vision.  Respiratory:  Negative for cough, hemoptysis, sputum production, shortness of breath, wheezing and stridor.   Cardiovascular:  Negative for  chest pain, palpitations, orthopnea, leg swelling and PND.  Gastrointestinal:  Negative for abdominal pain, constipation, diarrhea, heartburn, nausea and vomiting.  Genitourinary:  Negative for dysuria, hematuria and urgency.  Musculoskeletal:  Negative for joint pain and myalgias.  Skin:  Negative for itching and rash.  Neurological:  Negative for dizziness, tingling, weakness and headaches.  Endo/Heme/Allergies:  Negative for environmental allergies. Does not bruise/bleed easily.  Psychiatric/Behavioral:  Negative for depression. The patient is not nervous/anxious and does not have insomnia.   All other systems reviewed and are negative.    Objective:  Physical Exam Vitals reviewed.  Constitutional:      General: She is not in acute distress.    Appearance: She is well-developed.  HENT:     Head: Normocephalic and atraumatic.  Eyes:     General: No scleral icterus.    Conjunctiva/sclera: Conjunctivae normal.     Pupils: Pupils are equal, round, and reactive to light.  Neck:     Vascular: No JVD.     Trachea: No tracheal deviation.  Cardiovascular:     Rate and Rhythm: Normal rate and regular rhythm.     Heart sounds: Normal heart sounds. No murmur heard. Pulmonary:     Effort: Pulmonary effort is normal. No tachypnea, accessory muscle usage or respiratory distress.     Breath sounds: No stridor. No wheezing, rhonchi or rales.  Abdominal:     General: There is no distension.     Palpations: Abdomen is soft.     Tenderness: There is no abdominal tenderness.  Musculoskeletal:        General: No tenderness.     Cervical back: Neck supple.  Lymphadenopathy:     Cervical: No cervical adenopathy.  Skin:    General: Skin is warm and dry.     Capillary Refill: Capillary refill takes less than 2 seconds.     Findings: No rash.  Neurological:     Mental Status: She is alert and oriented to person, place, and time.  Psychiatric:        Behavior: Behavior normal.       Vitals:   09/08/22 0939  BP: (!) 140/90  Pulse: 65  SpO2: 95%  Weight: 194 lb 9.6 oz (88.3 kg)  Height: 5\' 5"  (1.651 m)   95% on RA BMI Readings from Last 3 Encounters:  09/08/22 32.38 kg/m  09/07/22 32.65 kg/m  04/12/22 32.62 kg/m   Wt Readings from Last 3 Encounters:  09/08/22 194 lb 9.6 oz (88.3 kg)  09/07/22 196 lb 3.4 oz (89 kg)  04/12/22 196 lb (88.9 kg)     CBC    Component Value Date/Time   WBC 11.2 (H) 09/07/2022 0329   RBC 4.61 09/07/2022 0329   HGB 12.7 09/07/2022 0329   HGB 12.3 07/20/2021 1324   HCT 40.0 09/07/2022 0329   HCT 38.5 07/20/2021 1324   PLT 246 09/07/2022 0329   PLT 232 07/20/2021 1324   MCV 86.8 09/07/2022 0329   MCV 85 07/20/2021 1324   MCH 27.5 09/07/2022 0329   MCHC 31.8 09/07/2022 0329   RDW 14.0 09/07/2022 0329   RDW 14.9 07/20/2021 1324   LYMPHSABS 1.4 10/13/2021 1808   MONOABS 0.8 10/13/2021 1808   EOSABS 0.4 10/13/2021 1808   BASOSABS 0.0 10/13/2021 1808     Chest Imaging:  CT chest 09/07/2022: Enlarging right sided pulmonary nodule with area of groundglass. Concern for persistent or recurrence of malignancy. Following SBRT. The patient's images have been independently reviewed by me.    Pulmonary Functions Testing Results:    Latest Ref Rng & Units 02/05/2022    2:45 PM  PFT Results  FVC-Pre L 1.24   FVC-Predicted Pre % 48   FVC-Post L 1.30   FVC-Predicted Post % 50   Pre FEV1/FVC % % 71   Post FEV1/FCV % % 70   FEV1-Pre L 0.87   FEV1-Predicted Pre % 46   FEV1-Post L 0.91   DLCO uncorrected ml/min/mmHg 22.14   DLCO UNC% % 115   DLCO corrected ml/min/mmHg 22.14   DLCO COR %Predicted % 115   DLVA Predicted % 117   TLC L 4.16   TLC % Predicted % 79   RV % Predicted % 102     FeNO:   Pathology:   Echocardiogram:   Heart Catheterization:     Assessment & Plan:     ICD-10-CM   1. Lung nodule  R91.1 Procedural/ Surgical Case Request: ROBOTIC ASSISTED NAVIGATIONAL BRONCHOSCOPY    Ambulatory  referral to Pulmonology    2. COPD GOLD 3 if use FEV1/VC  J44.9     3. Solitary pulmonary nodule on lung CT  R91.1       Discussion:  This is a 87 year old female history of a likely stage I malignancy was diagnosed with atypical cells on previous biopsy and decision was made for SBRT treatments.  Plan: I have messaged radiation oncology to let them know the findings of the recent CT results. I do think we probably need to move forward with repeat bronchoscopy and biopsy. Patient is agreeable to this plan.  She did undergo procedure in February tolerated this fine. I do think that potentially dealing with recurrent cancer.  And or the same malignancy that we had before. We will also set up for the same procedure with robotic assisted navigational bronchoscopy. Tentative bronchoscopy date we on 09/14/2022. If this date does not work we can probably use 09/16/2022 or the following week on Tuesday in September. Tentative bronchoscopy date will also depend on her 1 week follow-up with SG, and fever post bronchoscopy follow-up.    Current Outpatient Medications:    albuterol (PROVENTIL) (2.5 MG/3ML) 0.083% nebulizer  solution, USE ONE AMPULE VIA NEBULIZER EVERY 4 HOURS AS NEEDED SHORTNESS OF BREATH OR WHEEZING, Disp: 75 mL, Rfl: 3   BD PEN NEEDLE NANO U/F 32G X 4 MM MISC, See admin instructions., Disp: , Rfl:    budesonide (PULMICORT) 0.25 MG/2ML nebulizer solution, One vial twice daily with albuterol, Disp: 120 mL, Rfl: 12   carvedilol (COREG) 6.25 MG tablet, Take 1 tablet (6.25 mg total) by mouth 2 (two) times daily., Disp: 180 tablet, Rfl: 3   ferrous sulfate 325 (65 FE) MG EC tablet, Take 325 mg by mouth daily with breakfast., Disp: , Rfl:    furosemide (LASIX) 40 MG tablet, TAKE ONE TABLET BY MOUTH EVERY DAY, Disp: 90 tablet, Rfl: 1   gabapentin (NEURONTIN) 100 MG capsule, Take 100-200 mg by mouth at bedtime., Disp: , Rfl:    HYDROcodone-acetaminophen (NORCO/VICODIN) 5-325 MG tablet,  Take 2 tablets by mouth every 4 (four) hours as needed., Disp: 10 tablet, Rfl: 0   Insulin Detemir (LEVEMIR FLEXTOUCH) 100 UNIT/ML Pen, Inject 30 Units into the skin at bedtime. (Patient taking differently: Inject 40 Units into the skin at bedtime.), Disp: 15 mL, Rfl: 11   metFORMIN (GLUCOPHAGE) 500 MG tablet, Take 1 tablet (500 mg total) by mouth 2 (two) times daily., Disp: , Rfl:    neomycin-polymyxin-hydrocortisone (CORTISPORIN) 3.5-10000-1 OTIC suspension, Place 4 drops into the left ear 3 (three) times daily., Disp: , Rfl:    azithromycin (ZITHROMAX) 250 MG tablet, Take 250 mg by mouth as directed. (Patient not taking: Reported on 09/08/2022), Disp: , Rfl:    Josephine Igo, DO [facility] 09/08/2022 10:00 AM

## 2022-09-08 NOTE — Patient Instructions (Signed)
Thank you for visiting Dr. Tonia Brooms at [facility]. Today we recommend the following: Orders Placed This Encounter  Procedures   Procedural/ Surgical Case Request: ROBOTIC ASSISTED NAVIGATIONAL BRONCHOSCOPY   Ambulatory referral to Pulmonology    Return in about 13 days (around 09/21/2022) for with Kandice Robinsons, NP, after Bronchoscopy.    Please do your part to reduce the spread of COVID-19.

## 2022-09-10 ENCOUNTER — Other Ambulatory Visit (INDEPENDENT_AMBULATORY_CARE_PROVIDER_SITE_OTHER): Payer: Self-pay | Admitting: Nurse Practitioner

## 2022-09-10 DIAGNOSIS — I739 Peripheral vascular disease, unspecified: Secondary | ICD-10-CM

## 2022-09-15 DIAGNOSIS — I70219 Atherosclerosis of native arteries of extremities with intermittent claudication, unspecified extremity: Secondary | ICD-10-CM | POA: Insufficient documentation

## 2022-09-15 NOTE — Progress Notes (Signed)
MRN : [id number]  Stephanie [name] is a 87 y.o. (17-Apr-1935) female who presents with chief complaint of check circulation.  History of Present Illness:    The patient returns to the office for followup and review of the noninvasive studies.   There have been no interval changes in lower extremity symptoms per the patient. No interval shortening of the patient's claudication distance or development of rest pain symptoms. No new ulcers or wounds have occurred since the last visit.  There have been no significant changes to the patient's overall health care.  The patient denies amaurosis fugax or recent TIA symptoms. There are no documented recent neurological changes noted. There is no history of DVT, PE or superficial thrombophlebitis. The patient denies recent episodes of angina or shortness of breath.   ABI Rt=0.69 and Lt=0.63  (monophasic signals bilaterally) (previous ABI's Rt=1.23 and Lt=1.09)    No outpatient medications have been marked as taking for the 09/16/22 encounter (Appointment) with Gilda Crease, Latina Craver, MD.    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 Excell Seltzer and Dr. Laneta Simmers.   Severe aortic stenosis    Symptomatic anemia    Presumed GI bleed January 2021 - she declined GI work-up   Type 2 diabetes mellitus [facility])     Past Surgical History:  Procedure Laterality Date   APPENDECTOMY     BACK SURGERY     BRONCHIAL BIOPSY  03/02/2022   Procedure: BRONCHIAL BIOPSIES;  Surgeon: Josephine Igo, DO;  Location: [facility];  Service: Pulmonary;;    BRONCHIAL BRUSHINGS  03/02/2022   Procedure: BRONCHIAL BRUSHINGS;  Surgeon: Josephine Igo, DO;  Location: [facility];  Service: Pulmonary;;   BRONCHIAL NEEDLE ASPIRATION BIOPSY  03/02/2022   Procedure: BRONCHIAL NEEDLE ASPIRATION BIOPSIES;  Surgeon: Josephine Igo, DO;  Location: [facility];  Service: Pulmonary;;   CYST REMOVAL HAND     Right, hospital in [place]   FIDUCIAL MARKER PLACEMENT  03/02/2022   Procedure: FIDUCIAL MARKER PLACEMENT;  Surgeon: Josephine Igo, DO;  Location: [facility];  Service: Pulmonary;;   INTRAOPERATIVE TRANSTHORACIC ECHOCARDIOGRAM Left 07/01/2020   Procedure: INTRAOPERATIVE TRANSTHORACIC ECHOCARDIOGRAM;  Surgeon: Tonny Bollman, MD;  Location: [facility] OR;  Service: Open Heart Surgery;  Laterality: Left;   KNEE ARTHROSCOPY WITH MEDIAL MENISECTOMY Right 02/28/2012   Procedure: KNEE ARTHROSCOPY WITH MEDIAL MENISECTOMY;  Surgeon: Vickki Hearing, MD;  Location: [facility];  Service: Orthopedics;  Laterality: Right;   REPAIR KNEE LIGAMENT     RIGHT/LEFT HEART CATH AND CORONARY ANGIOGRAPHY N/A 04/10/2020   Procedure: RIGHT/LEFT HEART CATH AND CORONARY ANGIOGRAPHY;  Surgeon: Corky Crafts, MD;  Location: [facility] INVASIVE CV LAB;  Service: Cardiovascular;  Laterality: N/A;  TRANSCATHETER AORTIC VALVE REPLACEMENT, TRANSFEMORAL N/A 07/01/2020   Procedure: TRANSCATHETER AORTIC VALVE REPLACEMENT, TRANSFEMORAL;  Surgeon: Tonny Bollman, MD;  Location: [facility] OR;  Service: Open Heart Surgery;  Laterality: N/A;   ULTRASOUND GUIDANCE FOR VASCULAR ACCESS Bilateral 07/01/2020   Procedure: ULTRASOUND GUIDANCE FOR VASCULAR ACCESS;  Surgeon: Tonny Bollman, MD;  Location: [facility] OR;  Service: Open Heart Surgery;  Laterality: Bilateral;    Social History Social History   Tobacco Use   Smoking status: Former    Current packs/day: 1.00    Types: Cigarettes    Passive exposure: Never   Smokeless tobacco: Never   Tobacco comments:    Quit over 40 years ago.  Does not know how many years  she smoked or start/stop date  Vaping Use   Vaping status: Never Used  Substance Use Topics   Alcohol use: No   Drug use: No    Family History Family History  Problem Relation Age of Onset   Lung disease Other    Cancer Other    Arthritis Other    Asthma Other    Diabetes Other    Diabetes Mother    Hypertension Mother    Diverticulitis Mother    Diabetes Father    Hypertension Sister    Diabetes Brother    Colon cancer Neg Hx     Allergies  Allergen Reactions   Amoxicillin Swelling    Tongue swelling    Penicillins Swelling    Has patient had a PCN reaction causing immediate rash, facial/tongue/throat swelling, SOB or lightheadedness with hypotension: No Has patient had a PCN reaction causing severe rash involving mucus membranes or skin necrosis: No Has patient had a PCN reaction that required hospitalization: No Has patient had a PCN reaction occurring within the last 10 years: No If all of the above answers are "NO", then may proceed with Cephalosporin use.    Sulfa Antibiotics Swelling    Lip swelling   Nitrofurantoin Nausea And Vomiting     REVIEW OF SYSTEMS (Negative unless checked)  Constitutional: [] Weight loss  [] Fever  [] Chills Cardiac: [] Chest pain   [] Chest pressure   [] Palpitations   [] Shortness of breath when laying flat   [] Shortness of breath with exertion. Vascular:  [x] Pain in legs with walking   [] Pain in legs at rest  [] History of DVT   [] Phlebitis   [] Swelling in legs   [] Varicose veins   [] Non-healing ulcers Pulmonary:   [] Uses home oxygen   [] Productive cough   [] Hemoptysis   [] Wheeze  [] COPD   [] Asthma Neurologic:  [] Dizziness   [] Seizures   [] History of stroke   [] History of TIA  [] Aphasia   [] Vissual changes   [] Weakness or numbness in arm   [] Weakness or numbness in leg Musculoskeletal:   [] Joint swelling   [] Joint pain   [] Low back pain Hematologic:  [] Easy bruising  [] Easy bleeding   [] Hypercoagulable state   [] Anemic Gastrointestinal:   [] Diarrhea   [] Vomiting  [] Gastroesophageal reflux/heartburn   [] Difficulty swallowing. Genitourinary:  [] Chronic kidney disease   [] Difficult urination  [] Frequent urination   [] Blood in urine Skin:  [] Rashes   [] Ulcers  Psychological:  [] History of anxiety   []  History of major depression.  Physical Examination  There were no vitals filed for this visit. There is no height or weight on file to calculate BMI. Gen: WD/WN, NAD Head: [place]/AT, No temporalis wasting.  Ear/Nose/Throat: Hearing grossly intact, nares w/o erythema or drainage Eyes: PER, EOMI, sclera nonicteric.  Neck: Supple,  no masses.  No bruit or JVD.  Pulmonary:  Good air movement, no audible wheezing, no use of accessory muscles.  Cardiac: RRR, normal S1, S2, no Murmurs. Vascular:  mild trophic changes, no open wounds Vessel Right Left  Radial Palpable Palpable  PT Not Palpable Not Palpable  DP Not Palpable Not Palpable  Gastrointestinal: soft, non-distended. No guarding/no peritoneal signs.  Musculoskeletal: M/S 5/5 throughout.  No visible deformity.  Neurologic: CN 2-12 intact. Pain and light touch intact in extremities.  Symmetrical.  Speech is fluent. Motor exam as listed above. Psychiatric: Judgment intact, Mood & affect appropriate for pt's clinical situation. Dermatologic: No rashes or ulcers noted.  No changes consistent with cellulitis.   CBC Lab Results  Component Value Date   WBC 11.2 (H) 09/07/2022   HGB 12.7 09/07/2022   HCT 40.0 09/07/2022   MCV 86.8 09/07/2022   PLT 246 09/07/2022    BMET    Component Value Date/Time   NA 137 09/07/2022 0329   NA 139 07/20/2021 1324   K 4.8 09/07/2022 0329   CL 99 09/07/2022 0329   CO2 29 09/07/2022 0329   GLUCOSE 121 (H) 09/07/2022 0329   BUN 22 09/07/2022 0329   BUN 24 07/20/2021 1324   CREATININE 1.31 (H) 09/07/2022 0329   CALCIUM 10.3 09/07/2022 0329   GFRNONAA 40 (L) 09/07/2022 0329   GFRAA 49 (L) 02/10/2019 0610   Estimated Creatinine Clearance:  35.1 mL/min (A) (by C-G formula based on SCr of 1.31 mg/dL (H)).  COAG Lab Results  Component Value Date   INR 0.9 06/27/2020   INR 1.00 11/15/2017    Radiology CT CHEST ABDOMEN PELVIS W CONTRAST  Result Date: 09/07/2022 CLINICAL DATA:  87 year old female with history of right-sided rib pain. Suspected pneumonia on chest x-ray. History of lung cancer. * Tracking Code: BO * EXAM: CT CHEST, ABDOMEN, AND PELVIS WITH CONTRAST TECHNIQUE: Multidetector CT imaging of the chest, abdomen and pelvis was performed following the standard protocol during bolus administration of intravenous contrast. RADIATION DOSE REDUCTION: This exam was performed according to the departmental dose-optimization program which includes automated exposure control, adjustment of the mA and/or kV according to patient size and/or use of iterative reconstruction technique. CONTRAST:  75mL OMNIPAQUE IOHEXOL 350 MG/ML SOLN COMPARISON:  Chest CT 02/12/2022. FINDINGS: CT CHEST FINDINGS Cardiovascular: Heart size is mildly enlarged. There is no significant pericardial fluid, thickening or pericardial calcification. There is aortic atherosclerosis, as well as atherosclerosis of the great vessels of the mediastinum and the coronary arteries, including calcified atherosclerotic plaque in the left main, left anterior descending, left circumflex and right coronary arteries. Status post TAVR. Mediastinum/Nodes: No pathologically enlarged mediastinal or hilar lymph nodes. Esophagus is unremarkable in appearance. No axillary lymphadenopathy. Lungs/Pleura: When compared to the prior examination, the previously noted right lower lobe pulmonary nodule has substantially enlarged (axial image 77 of series 4 and coronal image 82 of series 2) now a 3.7 x 3.9 x 4.3 cm mass which has both solid and sub solid components (central solid component measuring up to 2.4 cm on today's study). This lesion has now across the inferior aspect of the major fissure, with  substantial involvement of the lateral segment of the right middle lobe which is new compared to the prior study. Fiducial marker from prior biopsy noted adjacent to the lesion in the right lower lobe. No other acute consolidative airspace disease. No pleural effusions. Scattered areas of mild pleural thickening are noted in the right hemithorax, including immediately adjacent  to the lesion, which may account for the patient's right-sided rib pain. Subsegmental atelectasis or scarring in the inferior segment of the lingula. Musculoskeletal: There are no aggressive appearing lytic or blastic lesions noted in the visualized portions of the skeleton. CT ABDOMEN PELVIS FINDINGS Hepatobiliary: Multiple small low-attenuation lesions scattered throughout the hepatic parenchyma, many of which are too small to definitively characterize, but statistically likely to represent cysts (no imaging follow-up recommended). The largest low-attenuation lesion in segment 2 of the liver (axial image 43 of series 3) measures 2.9 x 2.7 cm and is compatible with a simple cyst (no imaging follow-up recommended). Diffuse low attenuation throughout the hepatic parenchyma, indicative of a background of hepatic steatosis. No intra or extrahepatic biliary ductal dilatation. Gallbladder is unremarkable in appearance. Pancreas: No definite pancreatic mass or peripancreatic fluid collections or inflammatory changes are noted on today's noncontrast CT examination. Spleen: Unremarkable. Adrenals/Urinary Tract: Multiple low-attenuation lesions in the right kidney, compatible with simple cysts, largest of which measures 3.3 cm in the posterior aspect of the interpolar region. Other subcentimeter low-attenuation lesions in both kidneys, too small to definitively characterize, but statistically likely to represent small cysts (no imaging follow-up recommended). Right adrenal gland is normal in appearance. 2.4 x 2.0 cm left adrenal nodule, grossly stable  compared to prior examinations, likely a benign lesion such as a lipid poor adenoma. No hydroureteronephrosis. Urinary bladder is unremarkable in appearance. Stomach/Bowel: The appearance of the stomach is normal. There is no pathologic dilatation of small bowel or colon. Numerous colonic diverticula are noted, without surrounding inflammatory changes to suggest an acute diverticulitis at this time. The appendix is not confidently identified and may be surgically absent. Regardless, there are no inflammatory changes noted adjacent to the cecum to suggest the presence of an acute appendicitis at this time. Vascular/Lymphatic: Atherosclerosis in the abdominal aorta and pelvic vasculature, without definite aneurysm or dissection. No lymphadenopathy noted in the abdomen or pelvis. Reproductive: Uterus and ovaries are atrophic. Other: No significant volume of ascites.  No pneumoperitoneum. Musculoskeletal: Status post PLIF from L2-S1. There are no aggressive appearing lytic or blastic lesions noted in the visualized portions of the skeleton. IMPRESSION: 1. No definite acute findings are noted in the chest, abdomen or pelvis. However, the previously noted right lower lobe pulmonary nodule has substantially enlarged, now a mixed solid and sub solid mass overall measuring 3.7 x 3.9 x 4.3 cm with solid component measuring up to 2.4 cm, which now crosses the inferior aspect of the major fissure involving the lateral segment of the right middle lobe, and extending to the overlying pleura laterally which appears mildly thickened, which may account for the patient's right-sided chest pain. No lymphadenopathy or definite signs of metastatic disease outside of the thorax at this time. 2. Cardiomegaly. 3. Aortic atherosclerosis, in addition to left main and three-vessel coronary artery disease. 4. Hepatic steatosis. 5. Stable left adrenal nodule compared to several prior examinations, likely a benign lesions such as a lipid poor  adenoma. 6. Colonic diverticulosis without evidence of acute diverticulitis at this time. 7. Additional incidental findings, as above. Electronically Signed   By: Trudie Reed M.D.   On: 09/07/2022 05:55   DG Chest 2 View  Result Date: 09/07/2022 CLINICAL DATA:  Shortness of breath and right rib pain. History of COPD and lung cancer. EXAM: CHEST - 2 VIEW COMPARISON:  Chest radiograph 03/02/2022 FINDINGS: Stable cardiomegaly. Aortic atherosclerotic calcification. TAVR. Fiducial marker in the right lower lung. Associated airspace opacity compatible with subsolid  right lower lobe pulmonary nodule. This appears increased compared to 03/02/2022. Reticulonodular opacities in the right lower lung. No pleural effusion or pneumothorax. IMPRESSION: 1. Reticular nodule opacities in the right lower lung may be due to atypical infection or aspiration. 2. Increased size of the airspace opacity associated with the fiducial marker in the right lower lung. Electronically Signed   By: Minerva Fester M.D.   On: 09/07/2022 03:12     Assessment/Plan 1. 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  The patient does not voice lifestyle limiting changes at this point in time.  Noninvasive studies do suggest a change.  ABI Rt=0.69 and Lt=0.63  (monophasic signals bilaterally) (previous ABI's Rt=1.23 and Lt=1.09)  No invasive studies, angiography or surgery at this time The patient should continue walking and begin a more formal exercise program.  The patient should continue antiplatelet therapy and aggressive treatment of the lipid abnormalities  No changes in the patient's medications at this time  Continued surveillance is indicated as atherosclerosis is likely to progress with time.  However, given the deterioration noted on her ABI I will see her back in 3 months rather than 6  months.  The patient will continue follow up with noninvasive studies as ordered.  - VAS [place] ABI WITH/WO TBI; Future  2. Essential hypertension, benign Continue antihypertensive medications as already ordered, these medications have been reviewed and there are no changes at this time.  3. Mild persistent asthma, unspecified whether complicated Continue pulmonary medications and aerosols as already ordered, these medications have been reviewed and there are no changes at this time.   4. Type 2 diabetes mellitus with other specified complication, without long-term current use of insulin (HCC) Continue hypoglycemic medications as already ordered, these medications have been reviewed and there are no changes at this time.  Hgb A1C to be monitored as already arranged by primary service  5. Mixed hyperlipidemia Continue statin as ordered and reviewed, no changes at this time    Levora Dredge, MD  09/15/2022 3:51 PM

## 2022-09-16 ENCOUNTER — Ambulatory Visit (INDEPENDENT_AMBULATORY_CARE_PROVIDER_SITE_OTHER): Payer: Medicare HMO | Admitting: Vascular Surgery

## 2022-09-16 ENCOUNTER — Encounter (INDEPENDENT_AMBULATORY_CARE_PROVIDER_SITE_OTHER): Payer: Self-pay | Admitting: Vascular Surgery

## 2022-09-16 ENCOUNTER — Ambulatory Visit (INDEPENDENT_AMBULATORY_CARE_PROVIDER_SITE_OTHER): Payer: Medicare HMO

## 2022-09-16 VITALS — BP 168/68 | HR 37 | Resp 18 | Ht 65.0 in | Wt 194.6 lb

## 2022-09-16 DIAGNOSIS — I739 Peripheral vascular disease, unspecified: Secondary | ICD-10-CM | POA: Diagnosis not present

## 2022-09-16 DIAGNOSIS — J4530 Mild persistent asthma, uncomplicated: Secondary | ICD-10-CM

## 2022-09-16 DIAGNOSIS — I10 Essential (primary) hypertension: Secondary | ICD-10-CM | POA: Diagnosis not present

## 2022-09-16 DIAGNOSIS — I70219 Atherosclerosis of native arteries of extremities with intermittent claudication, unspecified extremity: Secondary | ICD-10-CM | POA: Diagnosis not present

## 2022-09-16 DIAGNOSIS — E782 Mixed hyperlipidemia: Secondary | ICD-10-CM

## 2022-09-16 DIAGNOSIS — E1169 Type 2 diabetes mellitus with other specified complication: Secondary | ICD-10-CM | POA: Diagnosis not present

## 2022-09-17 ENCOUNTER — Other Ambulatory Visit: Payer: Self-pay

## 2022-09-17 ENCOUNTER — Encounter (HOSPITAL_COMMUNITY): Payer: Self-pay | Admitting: Pulmonary Disease

## 2022-09-17 NOTE — Progress Notes (Signed)
Anesthesia Chart Review: Stephanie [name]  Case: [id number] Date/Time: 09/21/22 0915   Procedure: ROBOTIC ASSISTED NAVIGATIONAL BRONCHOSCOPY (Bilateral)   Anesthesia type: General   Diagnosis: Lung nodule [R91.1]   Pre-op diagnosis: Iung nodules   [facility]   Surgeons: Josephine Igo, DO       DISCUSSION: Patient is an 87 year old female scheduled for the above procedure. She is s/p video bronchoscopy with robotic assisted navigation on 03/02/22 for evaluation of RLL lung nodule. FNA cytology showed atypical cells and brushing showed no malignant cells. Previous PET scan showed some low-grade metabolic activity. There was still suspicion for underlying malignancy with recommendation for referral to radiation oncology to consider SBRT. S/p SBRT per Dr. Mitzi Hansen which were completed in May 2024. CT chest on 09/07/22 for pleuritic chest pain showed substantially increase in size of RLL subsolid mass with concern of potential pleural involvement. This was thought to be contributing to her symptoms. Repeat bronchoscopy with biopsy recommended.   History includes former smoker, aortic stenosis (s/p 26 mm TAVR 07/01/20), PAF (03/05/21; 06/2021), DM2, CKD, HTN, HLD, PAD, asthma, anemia (presumed GI bleed 01/2019, s/p PRBC, was scheduled for EGD/colonoscopy with [facility] in [place], unsure if completed), diverticulitis (06/2021),, back surgery. Mild non-obstructive CAD 03/2020 LHC.   Last visit with cardiologist Dr. Diona Browner was on 10/14/21 for follow-up after ED visit for atypical chest pain. During evaluation, a RLL lung density was noted on CXR (previously noted on 03/05/21 CTA with one year follow-up recommended). June 2023 echo showed "LVEF 50 to 55%, mild diastolic dysfunction, RVSP estimated 38 mmHg, moderate biatrial enlargement, and stable TAVR with mean gradient 8.3 mmHg and no significant paravalvular regurgitation." March 2022 cardiac catheterization  done prior to TAVR showed nonobstructive CAD. She was tolerating Eliquis for PAF, although notations in February indicated that she was no longer taking due to "Patient Preference."  He referred her to pulmonology when 10/30/21 chest CT showed increased size of RLL lung nodule.   Anesthesia team to evaluate on the day of surgery.    VS:  BP Readings from Last 3 Encounters:  09/16/22 (!) 168/68  09/08/22 (!) 140/90  09/07/22 (!) 142/103   Pulse Readings from Last 3 Encounters:  09/16/22 (!) 37  09/08/22 65  09/07/22 93     PROVIDERS: PCP is with [facility] Nona Dell, MD is cardiologist Sandrea Hughs, MD is pulmonologist Dorothy Puffer, MD is RAD-ONC Levora Dredge, MD is vascular surgeon Wilkie Aye MD is urologist. Evaluated 05/23/20 for left renal mass seen on 04/25/20 CT. He discussed renal ablation, surgery, or active surveillance. Patient opted for surveillance.  Sheppard Plumber, NP is vascular surgeon provider. Last visit 02/13/21 for PAD.    LABS: For day of surgery as indicated. Last results in [facility] include:  Lab Results  Component Value Date   WBC 11.2 (H) 09/07/2022   HGB 12.7 09/07/2022   HCT 40.0 09/07/2022   PLT 246 09/07/2022   GLUCOSE 121 (H) 09/07/2022   ALT 10 09/07/2022   AST 14 (L) 09/07/2022   NA 137 09/07/2022   K 4.8 09/07/2022   CL 99 09/07/2022   CREATININE 1.31 (H) 09/07/2022   BUN 22 09/07/2022   CO2 29 09/07/2022   TSH 3.200 07/02/2021   HGBA1C 7.5 (H) 07/02/2021    PFTs 02/05/22: "PFTs show moderate to severe restriction with normal diffusing capacity." FVC 1.24 (48%, post 1.30 (50%). FEV1 0.87 (46%), post 0.81 (  47%). FEV1/FVC ratio 71% (96%), post 70%. DLCO unc/cor 22/14 (115%).     IMAGES: CT Chest/abd/pelvis 09/07/22: IMPRESSION: 1. No definite acute findings are noted in the chest, abdomen or pelvis. However, the previously noted right lower lobe pulmonary nodule has substantially enlarged, now a mixed solid  and sub solid mass overall measuring 3.7 x 3.9 x 4.3 cm with solid component measuring up to 2.4 cm, which now crosses the inferior aspect of the major fissure involving the lateral segment of the right middle lobe, and extending to the overlying pleura laterally which appears mildly thickened, which may account for the patient's right-sided chest pain. No lymphadenopathy or definite signs of metastatic disease outside of the thorax at this time. 2. Cardiomegaly. 3. Aortic atherosclerosis, in addition to left main and three-vessel coronary artery disease. 4. Hepatic steatosis. 5. Stable left adrenal nodule compared to several prior examinations, likely a benign lesions such as a lipid poor adenoma. 6. Colonic diverticulosis without evidence of acute diverticulitis at this time. 7. Additional incidental findings, as above.   EKG: 09/07/22: Sinus rhythm with Premature supraventricular complexes Left axis deviation When compared with ECG of 13-Oct-2021 14:18, No significant change was found Confirmed by Kommor, Madison ([id number]) on 09/07/2022 4:08:05 AM     CV: ABIs/LE arterial US 09/16/22: As outlined in Dr. Marijean Heath office note, "Today noninvasive symptoms show a right ABI of 0.69 and a left ABI of 0.63. The patient's previous ABIs 1.23 on the right and 1.09 on the left. She has monophasic tibial artery waveforms bilaterally with dampened toe waveforms bilaterally."   Echo 07/02/21: IMPRESSIONS   1. Left ventricular ejection fraction, by estimation, is 50 to 55%. Left  ventricular ejection fraction by 2D MOD biplane is 51.9 %. The left  ventricle has low normal function. The left ventricle has no regional wall  motion abnormalities. There is  moderate left ventricular hypertrophy. Left ventricular diastolic  parameters are consistent with Grade I diastolic dysfunction (impaired  relaxation).   2. Right ventricular systolic function is normal. The right ventricular  size is normal.  There is mildly elevated pulmonary artery systolic  pressure. The estimated right ventricular systolic pressure is 38.3 mmHg.   3. Left atrial size was moderately dilated.   4. Right atrial size was moderately dilated.   5. The mitral valve is abnormal. Trivial mitral valve regurgitation.   6. The aortic valve has been repaired/replaced. Aortic valve  regurgitation is not visualized. There is a 26 mm Sapien prosthetic (TAVR)  valve present in the aortic position. Procedure Date: 07/01/2020. Echo  findings are consistent with normal structure  and function of the aortic valve prosthesis. Aortic valve area, by VTI  measures 1.83 cm. Aortic valve mean gradient measures 8.3 mmHg. Aortic  valve Vmax measures 2.10 m/s.  - Comparison(s): Changes from prior study are noted. 08/07/2020: LVEF 45-50%,  TAVR - mean gradient 9 mmHg.      Long term Ziopatch monitor 07/25/20 ZIO AT reviewed. 12 days, 21 hours analyzed. Predominant rhythm is sinus with prolonged PR interval, heart rate ranging from 58 bpm up to 126 bpm and average heart rate 90 bpm. There were frequent PACs representing 14.5% total beats, occasional atrial couplets at 1.5% total beats, and rare atrial triplets. There were occasional PVCs representing 1.1% total beats. Multiple episodes of SVT were noted, relatively brief in duration with the longest lasting at 14 beats and at average heart rate 133 bpm. Brief episodes of NSVT were also noted, there was an episode  of probable aberrant SVT lasting 21 beats at 8.4 seconds. No pauses.      Carotid US 04/11/20: Summary:  - Right Carotid: Velocities in the right ICA are consistent with a 1-39% stenosis.  - Left Carotid: Velocities in the left ICA are consistent with a 1-39% stenosis. Significant tortuosity of the left ICA noted.  - Vertebrals:  Bilateral vertebral arteries demonstrate antegrade flow.  - Subclavians: Normal flow hemodynamics were seen in bilateral subclavian arteries.     RHC/LHC  04/10/20 (PRE-TAVR): Mid Cx lesion is 50% stenosed. LV end diastolic pressure is mildly elevated. There is mild aortic valve stenosis. Calculated valve area 1.56 cm2. Ao sat 98%, PA sat 73%, PA pressure 39/5, mean PA pressure 19 mm Hg; mean PCWP 14 mm Hg; CO 6.5 L/min; CI 3.3. Small radial artery. Ulnar access obtained. Vasospasm noted. Nonobstructive CAD.  Mild aortic stenosis.     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 Excell Seltzer and Dr. Laneta Simmers.   Severe aortic stenosis    Symptomatic anemia    Presumed GI bleed January 2021 - she declined GI work-up   Type 2 diabetes mellitus [facility])     Past Surgical History:  Procedure Laterality Date   APPENDECTOMY     BACK SURGERY     BRONCHIAL BIOPSY  03/02/2022   Procedure: BRONCHIAL BIOPSIES;  Surgeon: Josephine Igo, DO;  [facility];  Service: Pulmonary;;   BRONCHIAL BRUSHINGS  03/02/2022   Procedure: BRONCHIAL BRUSHINGS;  Surgeon: Josephine Igo, DO;  [facility];  Service: Pulmonary;;   BRONCHIAL NEEDLE ASPIRATION BIOPSY  03/02/2022   Procedure: BRONCHIAL NEEDLE ASPIRATION BIOPSIES;  Surgeon: Josephine Igo, DO;  [facility];  Service: Pulmonary;;   CYST REMOVAL HAND     Right, hospital in [place]   FIDUCIAL MARKER PLACEMENT  03/02/2022   Procedure: FIDUCIAL MARKER PLACEMENT;  Surgeon: Josephine Igo, DO;  [facility];  Service: Pulmonary;;   INTRAOPERATIVE TRANSTHORACIC ECHOCARDIOGRAM Left 07/01/2020   Procedure: INTRAOPERATIVE TRANSTHORACIC ECHOCARDIOGRAM;  Surgeon: Tonny Bollman, MD;  Location: [facility] OR;  Service: Open Heart Surgery;  Laterality: Left;    KNEE ARTHROSCOPY WITH MEDIAL MENISECTOMY Right 02/28/2012   Procedure: KNEE ARTHROSCOPY WITH MEDIAL MENISECTOMY;  Surgeon: Vickki Hearing, MD;  Location: [facility];  Service: Orthopedics;  Laterality: Right;   REPAIR KNEE LIGAMENT     RIGHT/LEFT HEART CATH AND CORONARY ANGIOGRAPHY N/A 04/10/2020   Procedure: RIGHT/LEFT HEART CATH AND CORONARY ANGIOGRAPHY;  Surgeon: Corky Crafts, MD;  Location: [facility] INVASIVE CV LAB;  Service: Cardiovascular;  Laterality: N/A;   TRANSCATHETER AORTIC VALVE REPLACEMENT, TRANSFEMORAL N/A 07/01/2020   Procedure: TRANSCATHETER AORTIC VALVE REPLACEMENT, TRANSFEMORAL;  Surgeon: Tonny Bollman, MD;  Location: [facility] OR;  Service: Open Heart Surgery;  Laterality: N/A;   ULTRASOUND GUIDANCE FOR VASCULAR ACCESS Bilateral 07/01/2020   Procedure: ULTRASOUND GUIDANCE FOR VASCULAR ACCESS;  Surgeon: Tonny Bollman, MD;  Location: [facility] OR;  Service: Open Heart Surgery;  Laterality: Bilateral;    MEDICATIONS: No current facility-administered medications for this encounter.    albuterol (PROVENTIL) (2.5 MG/3ML) 0.083% nebulizer  solution   azithromycin (ZITHROMAX) 250 MG tablet   BD PEN NEEDLE NANO U/F 32G X 4 MM MISC   budesonide (PULMICORT) 0.25 MG/2ML nebulizer solution   carvedilol (COREG) 6.25 MG tablet   ferrous sulfate 325 (65 FE) MG EC tablet   furosemide (LASIX) 40 MG tablet   gabapentin (NEURONTIN) 100 MG capsule   HYDROcodone-acetaminophen (NORCO/VICODIN) 5-325 MG tablet   Insulin Detemir (LEVEMIR FLEXTOUCH) 100 UNIT/ML Pen   metFORMIN (GLUCOPHAGE) 500 MG tablet   neomycin-polymyxin-hydrocortisone (CORTISPORIN) 3.5-10000-1 OTIC suspension    Shonna Chock, PA-C Surgical Short Stay/Anesthesiology [facility] Phone [phone] [facility] Phone [phone] 09/17/2022 10:12 AM

## 2022-09-17 NOTE — Progress Notes (Signed)
PCP - [facility] Cardiologist - Jonelle Sidle, MD  EKG - 09/07/2022 Chest x-ray - 09/07/2022 ECHO - 07/02/2021 Cardiac Cath - 07/01/2020  Sleep Study Denies   DM-II Fasting Blood Sugar:  130's Checks Blood Sugar:  twice/day  Blood Thinner Instructions: Denies Aspirin Instructions: Denies  ERAS Protcol - N/A COVID TEST- N/A  Anesthesia review: Yes, cardiac hx  -------------  SDW INSTRUCTIONS:  Your procedure is scheduled on Tuesday September 03. Please report to [facility] Main Entrance "A" at 0645 A.M., and check in at the Admitting office. Call this number if you have problems the morning of surgery: [phone]   Remember: Do not eat or drink after midnight the night before your surgery   Medications to take morning of surgery with a sip of water include: budesonide (PULMICORT)  carvedilol (COREG)    IF NEEDED albuterol (PROVENTIL) .  Please bring on DOS HYDROcodone-acetaminophen (NORCO/VICODIN)   As of today, STOP taking any Aspirin (unless otherwise instructed by your surgeon), Aleve, Naproxen, Ibuprofen, Motrin, Advil, Goody's, BC's, all herbal medications, fish oil, and all vitamins.  WHAT DO I DO ABOUT MY DIABETES MEDICATION?   Do not take oral diabetes medicines (pills) the morning of surgery.  THE NIGHT BEFORE SURGERY, take __50% of 40=20_________ units of ______Levimir_____insulin.       The day of surgery, do not take other diabetes injectables, including Byetta (exenatide), Bydureon (exenatide ER), Victoza (liraglutide), or Trulicity (dulaglutide).  If your CBG is greater than 220 mg/dL, you may take  of your sliding scale (correction) dose of insulin.   HOW TO MANAGE YOUR DIABETES BEFORE AND AFTER SURGERY  Why is it important to control my blood sugar before and after surgery? Improving blood sugar levels before and after surgery helps healing and can limit problems. A way of improving blood sugar control is  eating a healthy diet by:  Eating less sugar and carbohydrates  Increasing activity/exercise  Talking with your doctor about reaching your blood sugar goals High blood sugars (greater than 180 mg/dL) can raise your risk of infections and slow your recovery, so you will need to focus on controlling your diabetes during the weeks before surgery. Make sure that the doctor who takes care of your diabetes knows about your planned surgery including the date and location.  How do I manage my blood sugar before surgery? Check your blood sugar at least 4 times a day, starting 2 days before surgery, to make sure that the level is not too high or low.  Check your blood sugar the morning of your surgery when you wake up and every 2 hours until you get to the Short Stay unit.  If your blood sugar is less than 70 mg/dL, you will need to treat for low blood sugar: Do not take insulin. Treat a low blood sugar (less than 70 mg/dL) with  cup of clear juice (cranberry or apple), 4 glucose tablets, OR glucose gel. Recheck blood sugar in 15 minutes after treatment (to make sure it is greater than 70 mg/dL). If your blood sugar is not greater than 70 mg/dL on recheck, call [phone] for further instructions. Report your blood sugar to the short stay nurse when you get to Short Stay.  If you are admitted to the hospital after surgery: Your blood sugar will be checked by the staff and you will probably be given insulin after surgery (instead of oral diabetes medicines) to make sure you have good blood sugar levels.  The goal for blood sugar control after surgery is 80-180 mg/dL.    The Morning of Surgery Do not wear jewelry, make-up or nail polish. Do not wear lotions, powders, or perfumes/colognes, or deodorant Do not bring valuables to the hospital. [facility] is not responsible for any belongings or valuables.  If you are a smoker, DO NOT Smoke 24 hours prior to surgery  If you wear a CPAP at night  please bring your mask the morning of surgery   Remember that you must have someone to transport you home after your surgery, and remain with you for 24 hours if you are discharged the same day.  Please bring cases for contacts, glasses, hearing aids, dentures or bridgework because it cannot be worn into surgery.   Patients discharged the day of surgery will not be allowed to drive home.   Please shower the NIGHT BEFORE/MORNING OF SURGERY (use antibacterial soap like DIAL soap if possible). Wear comfortable clothes the morning of surgery. Oral Hygiene is also important to reduce your risk of infection.  Remember - BRUSH YOUR TEETH THE MORNING OF SURGERY WITH YOUR REGULAR TOOTHPASTE  Patient denies shortness of breath, fever, cough and chest pain.

## 2022-09-17 NOTE — Anesthesia Preprocedure Evaluation (Signed)
Anesthesia Evaluation  Patient identified by MRN, date of birth, ID band Patient awake    Reviewed: Allergy & Precautions, NPO status , Patient's Chart, lab work & pertinent test results  History of Anesthesia Complications Negative for: history of anesthetic complications  Airway Mallampati: II  TM Distance: >3 FB Neck ROM: Full   Comment: Previous grade I view with Miller 2, easy mask Dental  (+) Dental Advisory Given, Poor Dentition Missing several teeth. Chips. Denies loose teeth.:   Pulmonary asthma , neg sleep apnea, COPD, neg recent URI, former smoker Lung nodules   Pulmonary exam normal breath sounds clear to auscultation       Cardiovascular hypertension (carvedilol, furosemide), Pt. on home beta blockers (-) angina + CAD (non-obstructive), + Peripheral Vascular Disease and + DOE  + dysrhythmias Atrial Fibrillation + Valvular Problems/Murmurs (s/p TAVR)  Rhythm:Regular Rate:Normal  HLD  TTE 07/02/2021: IMPRESSIONS     1. Left ventricular ejection fraction, by estimation, is 50 to 55%. Left  ventricular ejection fraction by 2D MOD biplane is 51.9 %. The left  ventricle has low normal function. The left ventricle has no regional wall  motion abnormalities. There is  moderate left ventricular hypertrophy. Left ventricular diastolic  parameters are consistent with Grade I diastolic dysfunction (impaired  relaxation).   2. Right ventricular systolic function is normal. The right ventricular  size is normal. There is mildly elevated pulmonary artery systolic  pressure. The estimated right ventricular systolic pressure is 38.3 mmHg.   3. Left atrial size was moderately dilated.   4. Right atrial size was moderately dilated.   5. The mitral valve is abnormal. Trivial mitral valve regurgitation.   6. The aortic valve has been repaired/replaced. Aortic valve  regurgitation is not visualized. There is a 26 mm Sapien prosthetic  (TAVR)  valve present in the aortic position. Procedure Date: 07/01/2020. Echo  findings are consistent with normal structure  and function of the aortic valve prosthesis. Aortic valve area, by VTI  measures 1.83 cm. Aortic valve mean gradient measures 8.3 mmHg. Aortic  valve Vmax measures 2.10 m/s.     Neuro/Psych neg Seizures negative neurological ROS     GI/Hepatic negative GI ROS, Neg liver ROS,,,  Endo/Other  diabetes, Type 2, Insulin Dependent, Oral Hypoglycemic Agents    Renal/GU CRFRenal disease     Musculoskeletal  (+) Arthritis , Osteoarthritis,    Abdominal  (+) + obese  Peds  Hematology  (+) Blood dyscrasia, anemia   Anesthesia Other Findings   Reproductive/Obstetrics                             Anesthesia Physical Anesthesia Plan  ASA: 3  Anesthesia Plan: General   Post-op Pain Management: Tylenol PO (pre-op)*   Induction: Intravenous  PONV Risk Score and Plan: 3 and Ondansetron, Dexamethasone, Treatment may vary due to age or medical condition, Propofol infusion and TIVA  Airway Management Planned: Oral ETT  Additional Equipment:   Intra-op Plan:   Post-operative Plan: Extubation in OR  Informed Consent: I have reviewed the patients History and Physical, chart, labs and discussed the procedure including the risks, benefits and alternatives for the proposed anesthesia with the patient or authorized representative who has indicated his/her understanding and acceptance.     Dental advisory given  Plan Discussed with: CRNA and Anesthesiologist  Anesthesia Plan Comments: (PAT note written 09/17/2022 by Shonna Chock, PA-C.  Risks of general anesthesia discussed including, but  not limited to, sore throat, hoarse voice, chipped/damaged teeth, injury to vocal cords, nausea and vomiting, allergic reactions, lung infection, heart attack, stroke, and death. All questions answered.   )       Anesthesia Quick  Evaluation

## 2022-09-18 ENCOUNTER — Other Ambulatory Visit: Payer: Self-pay

## 2022-09-18 ENCOUNTER — Encounter (HOSPITAL_COMMUNITY): Payer: Self-pay

## 2022-09-18 ENCOUNTER — Emergency Department (HOSPITAL_COMMUNITY)
Admission: EM | Admit: 2022-09-18 | Discharge: 2022-09-18 | Disposition: A | Discharge status: Home / Self Care | Payer: Medicare HMO | Attending: Emergency Medicine | Admitting: Emergency Medicine

## 2022-09-18 DIAGNOSIS — J019 Acute sinusitis, unspecified: Secondary | ICD-10-CM | POA: Diagnosis not present

## 2022-09-18 DIAGNOSIS — H9393 Unspecified disorder of ear, bilateral: Secondary | ICD-10-CM | POA: Insufficient documentation

## 2022-09-18 DIAGNOSIS — R0981 Nasal congestion: Secondary | ICD-10-CM | POA: Diagnosis present

## 2022-09-18 NOTE — ED Notes (Signed)
EDP in room with pt at this time

## 2022-09-18 NOTE — Discharge Instructions (Signed)
Recommend Cetirizine 10 mg once daily and Nasacort 24 hour nasal spray to help with symptoms of sinus pressure. Recheck with your doctor at the end of one week to insure that symptoms have improved.

## 2022-09-18 NOTE — ED Triage Notes (Signed)
Ear fullness and nasal congestion that started yesterday   Denies cough and fever

## 2022-09-18 NOTE — ED Provider Notes (Signed)
[place] [facility] AT [facility] Provider Note   CSN: [id number] Arrival date & time: 09/18/22  1751     History  Chief Complaint  Patient presents with   Ear Fullness    Stephanie [name] is a 87 y.o. female.  Patient to ED with c/o with 2-day history of bilateral ear fullness, muffled hearing, and nasal congestion and drainage. No headache, fever, ear drainage, sore throat or cough. She has tried a nasal spray without relief.   The history is provided by the patient. No language interpreter was used.  Ear Fullness       Home Medications Prior to Admission medications   Medication Sig Start Date End Date Taking? Authorizing Provider  albuterol (PROVENTIL) (2.5 MG/3ML) 0.083% nebulizer solution USE ONE AMPULE VIA NEBULIZER EVERY 4 HOURS AS NEEDED SHORTNESS OF BREATH OR WHEEZING 06/21/22   Nyoka Cowden, MD  azithromycin (ZITHROMAX) 250 MG tablet Take 250 mg by mouth as directed. Patient not taking: Reported on 09/08/2022 03/31/22   [provider]  BD PEN NEEDLE NANO U/F 32G X 4 MM MISC See admin instructions. 03/30/22   [provider]  budesonide (PULMICORT) 0.25 MG/2ML nebulizer solution One vial twice daily with albuterol 04/07/22   Nyoka Cowden, MD  carvedilol (COREG) 6.25 MG tablet Take 1 tablet (6.25 mg total) by mouth 2 (two) times daily. 05/06/22   Sharlene Dory, NP  ferrous sulfate 325 (65 FE) MG EC tablet Take 325 mg by mouth daily with breakfast.    [provider]  furosemide (LASIX) 40 MG tablet TAKE ONE TABLET BY MOUTH EVERY DAY 07/21/22   Jonelle Sidle, MD  gabapentin (NEURONTIN) 100 MG capsule Take 100-200 mg by mouth at bedtime. 10/02/19   [provider]  HYDROcodone-acetaminophen (NORCO/VICODIN) 5-325 MG tablet Take 2 tablets by mouth every 4 (four) hours as needed. 09/07/22   Kommor, Madison, MD  Insulin Detemir (LEVEMIR FLEXTOUCH) 100 UNIT/ML Pen Inject 30 Units into the skin at bedtime. Patient  taking differently: Inject 40 Units into the skin at bedtime. 02/10/19   Johnson, Clanford L, MD  metFORMIN (GLUCOPHAGE) 500 MG tablet Take 1 tablet (500 mg total) by mouth 2 (two) times daily. 07/02/20   Janetta Hora, PA-C  neomycin-polymyxin-hydrocortisone (CORTISPORIN) 3.5-10000-1 OTIC suspension Place 4 drops into the left ear 3 (three) times daily. Patient not taking: Reported on 09/17/2022 02/25/22   [provider]      Allergies    Amoxicillin, Penicillins, Sulfa antibiotics, and Nitrofurantoin    Review of Systems   Review of Systems  Physical Exam Updated Vital Signs BP (!) 168/70   Pulse 60   Temp 98.9 F (37.2 C) (Oral)   Resp 18   Ht 5\' 5"  (1.651 m)   Wt 102.1 kg   SpO2 96%   BMI 37.44 kg/m  Physical Exam Vitals and nursing note reviewed.  Constitutional:      General: She is not in acute distress.    Appearance: Normal appearance.  HENT:     Head: Normocephalic.     Right Ear: Tympanic membrane, ear canal and external ear normal.     Left Ear: Tympanic membrane, ear canal and external ear normal.     Nose: Mucosal edema present.     Mouth/Throat:     Mouth: Mucous membranes are moist.     Pharynx: Uvula midline. No pharyngeal swelling.  Cardiovascular:     Rate and Rhythm: Normal rate.  Pulmonary:  Effort: Pulmonary effort is normal.  Musculoskeletal:        General: Normal range of motion.     Cervical back: Normal range of motion and neck supple.  Skin:    General: Skin is warm and dry.  Neurological:     Mental Status: She is alert and oriented to person, place, and time.     ED Results / Procedures / Treatments   Labs (all labs ordered are listed, but only abnormal results are displayed) Labs Reviewed - No data to display  EKG None  Radiology No results found.  Procedures Procedures    Medications Ordered in ED Medications - No data to display  ED Course/ Medical Decision Making/ A&P Clinical Course as of 09/18/22  2321  Sat Sep 18, 2022  2320 Patient with ear pressure and nasal congestion. No fever. Sxs x 1 day. Very well appearing. VSS. Exam reassuring. Recommend Cetirizine daily and steroid nasal spray.  [SU]    Clinical Course User Index [SU] Elpidio Anis, PA-C                                 Medical Decision Making          Final Clinical Impression(s) / ED Diagnoses Final diagnoses:  Acute sinusitis, recurrence not specified, unspecified location    Rx / DC Orders ED Discharge Orders     None         Danne Harbor 09/18/22 2322    Vanetta Mulders, MD 09/19/22 2328

## 2022-09-18 NOTE — ED Notes (Signed)
Dc instructions reviewed with pt no questions or concerns at this time.  

## 2022-09-19 ENCOUNTER — Encounter (INDEPENDENT_AMBULATORY_CARE_PROVIDER_SITE_OTHER): Payer: Self-pay | Admitting: Vascular Surgery

## 2022-09-21 ENCOUNTER — Other Ambulatory Visit: Payer: Self-pay | Admitting: Cardiology

## 2022-09-21 ENCOUNTER — Ambulatory Visit (HOSPITAL_COMMUNITY): Payer: Medicare HMO

## 2022-09-21 ENCOUNTER — Encounter (HOSPITAL_COMMUNITY): Payer: Self-pay | Admitting: Pulmonary Disease

## 2022-09-21 ENCOUNTER — Ambulatory Visit (HOSPITAL_BASED_OUTPATIENT_CLINIC_OR_DEPARTMENT_OTHER): Payer: Medicare HMO | Admitting: Vascular Surgery

## 2022-09-21 ENCOUNTER — Ambulatory Visit (HOSPITAL_COMMUNITY)
Admission: RE | Admit: 2022-09-21 | Discharge: 2022-09-21 | Disposition: A | Discharge status: Home / Self Care | Payer: Medicare HMO | Attending: Pulmonary Disease | Admitting: Pulmonary Disease

## 2022-09-21 ENCOUNTER — Other Ambulatory Visit: Payer: Self-pay

## 2022-09-21 ENCOUNTER — Encounter (HOSPITAL_COMMUNITY)
Admission: RE | Disposition: A | Discharge status: Home / Self Care | Payer: Self-pay | Source: Home / Self Care | Attending: Pulmonary Disease

## 2022-09-21 ENCOUNTER — Ambulatory Visit (HOSPITAL_COMMUNITY): Payer: Medicare HMO | Admitting: Vascular Surgery

## 2022-09-21 DIAGNOSIS — Z87891 Personal history of nicotine dependence: Secondary | ICD-10-CM

## 2022-09-21 DIAGNOSIS — R001 Bradycardia, unspecified: Secondary | ICD-10-CM | POA: Diagnosis not present

## 2022-09-21 DIAGNOSIS — I10 Essential (primary) hypertension: Secondary | ICD-10-CM | POA: Diagnosis not present

## 2022-09-21 DIAGNOSIS — I444 Left anterior fascicular block: Secondary | ICD-10-CM | POA: Insufficient documentation

## 2022-09-21 DIAGNOSIS — Z952 Presence of prosthetic heart valve: Secondary | ICD-10-CM | POA: Diagnosis not present

## 2022-09-21 DIAGNOSIS — E1122 Type 2 diabetes mellitus with diabetic chronic kidney disease: Secondary | ICD-10-CM | POA: Insufficient documentation

## 2022-09-21 DIAGNOSIS — C3490 Malignant neoplasm of unspecified part of unspecified bronchus or lung: Secondary | ICD-10-CM | POA: Diagnosis not present

## 2022-09-21 DIAGNOSIS — I480 Paroxysmal atrial fibrillation: Secondary | ICD-10-CM | POA: Diagnosis not present

## 2022-09-21 DIAGNOSIS — I130 Hypertensive heart and chronic kidney disease with heart failure and stage 1 through stage 4 chronic kidney disease, or unspecified chronic kidney disease: Secondary | ICD-10-CM | POA: Diagnosis not present

## 2022-09-21 DIAGNOSIS — J4489 Other specified chronic obstructive pulmonary disease: Secondary | ICD-10-CM | POA: Insufficient documentation

## 2022-09-21 DIAGNOSIS — I2510 Atherosclerotic heart disease of native coronary artery without angina pectoris: Secondary | ICD-10-CM | POA: Insufficient documentation

## 2022-09-21 DIAGNOSIS — E785 Hyperlipidemia, unspecified: Secondary | ICD-10-CM | POA: Diagnosis not present

## 2022-09-21 DIAGNOSIS — R911 Solitary pulmonary nodule: Secondary | ICD-10-CM | POA: Diagnosis present

## 2022-09-21 DIAGNOSIS — Z794 Long term (current) use of insulin: Secondary | ICD-10-CM | POA: Diagnosis not present

## 2022-09-21 DIAGNOSIS — Z539 Procedure and treatment not carried out, unspecified reason: Secondary | ICD-10-CM | POA: Diagnosis not present

## 2022-09-21 DIAGNOSIS — N189 Chronic kidney disease, unspecified: Secondary | ICD-10-CM | POA: Insufficient documentation

## 2022-09-21 DIAGNOSIS — R918 Other nonspecific abnormal finding of lung field: Secondary | ICD-10-CM

## 2022-09-21 DIAGNOSIS — Z538 Procedure and treatment not carried out for other reasons: Secondary | ICD-10-CM | POA: Diagnosis not present

## 2022-09-21 DIAGNOSIS — Z7984 Long term (current) use of oral hypoglycemic drugs: Secondary | ICD-10-CM | POA: Insufficient documentation

## 2022-09-21 DIAGNOSIS — I350 Nonrheumatic aortic (valve) stenosis: Secondary | ICD-10-CM | POA: Insufficient documentation

## 2022-09-21 DIAGNOSIS — E1151 Type 2 diabetes mellitus with diabetic peripheral angiopathy without gangrene: Secondary | ICD-10-CM | POA: Insufficient documentation

## 2022-09-21 DIAGNOSIS — Z7901 Long term (current) use of anticoagulants: Secondary | ICD-10-CM | POA: Diagnosis not present

## 2022-09-21 LAB — GLUCOSE, CAPILLARY
Glucose-Capillary: 91 mg/dL (ref 70–99)
Glucose-Capillary: 75 mg/dL (ref 70–99)

## 2022-09-21 SURGERY — INVASIVE LAB ABORTED CASE
Anesthesia: General

## 2022-09-21 MED ORDER — PHENYLEPHRINE HCL-NACL 20-0.9 MG/250ML-% IV SOLN
INTRAVENOUS | Status: DC | PRN
Start: 2022-09-21 — End: 2022-09-21

## 2022-09-21 MED ORDER — PROPOFOL 500 MG/50ML IV EMUL
INTRAVENOUS | Status: DC | PRN
Start: 2022-09-21 — End: 2022-09-21
  Administered 2022-09-21: 100 ug/kg/min via INTRAVENOUS

## 2022-09-21 MED ORDER — SUGAMMADEX SODIUM 200 MG/2ML IV SOLN
INTRAVENOUS | Status: DC | PRN
Start: 2022-09-21 — End: 2022-09-21
  Administered 2022-09-21: 400 mg via INTRAVENOUS

## 2022-09-21 MED ORDER — HYDRALAZINE HCL 20 MG/ML IJ SOLN
INTRAMUSCULAR | Status: DC | PRN
Start: 2022-09-21 — End: 2022-09-21
  Administered 2022-09-21: 5 mg via INTRAVENOUS

## 2022-09-21 MED ORDER — GLYCOPYRROLATE 0.2 MG/ML IJ SOLN
INTRAMUSCULAR | Status: DC | PRN
  Administered 2022-09-21: .2 mg via INTRAVENOUS

## 2022-09-21 MED ORDER — ROCURONIUM BROMIDE 100 MG/10ML IV SOLN
INTRAVENOUS | Status: DC | PRN
Start: 2022-09-21 — End: 2022-09-21
  Administered 2022-09-21: 60 mg via INTRAVENOUS

## 2022-09-21 MED ORDER — LIDOCAINE HCL (PF) 2 % IJ SOLN
INTRAMUSCULAR | Status: DC | PRN
Start: 2022-09-21 — End: 2022-09-21
  Administered 2022-09-21: 100 mg via INTRADERMAL

## 2022-09-21 MED ORDER — FENTANYL CITRATE (PF) 100 MCG/2ML IJ SOLN
INTRAMUSCULAR | Status: AC
  Filled 2022-09-21: qty 2

## 2022-09-21 MED ORDER — VASOPRESSIN 20 UNIT/ML IV SOLN
INTRAVENOUS | Status: DC | PRN
Start: 2022-09-21 — End: 2022-09-21
  Administered 2022-09-21 (×2): 2 [IU] via INTRAVENOUS

## 2022-09-21 MED ORDER — OXYCODONE HCL 5 MG/5ML PO SOLN: 5.0000 mg | Freq: Once | ORAL | Status: DC | PRN

## 2022-09-21 MED ORDER — LACTATED RINGERS IV SOLN: INTRAVENOUS | Status: DC

## 2022-09-21 MED ORDER — OXYCODONE HCL 5 MG PO TABS: 5.0000 mg | ORAL_TABLET | Freq: Once | ORAL | Status: DC | PRN

## 2022-09-21 MED ORDER — CHLORHEXIDINE GLUCONATE 0.12 % MT SOLN
OROMUCOSAL | Status: AC
  Administered 2022-09-21: 15 mL via OROMUCOSAL
  Filled 2022-09-21: qty 15

## 2022-09-21 MED ORDER — PROPOFOL 10 MG/ML IV BOLUS
INTRAVENOUS | Status: DC | PRN
Start: 2022-09-21 — End: 2022-09-21
  Administered 2022-09-21: 100 mg via INTRAVENOUS
  Administered 2022-09-21: 50 mg via INTRAVENOUS

## 2022-09-21 MED ORDER — PHENYLEPHRINE HCL (PRESSORS) 10 MG/ML IV SOLN
INTRAVENOUS | Status: DC | PRN
Start: 2022-09-21 — End: 2022-09-21
  Administered 2022-09-21: 80 ug via INTRAVENOUS

## 2022-09-21 MED ORDER — ACETAMINOPHEN 500 MG PO TABS
1000.0000 mg | ORAL_TABLET | Freq: Once | ORAL | Status: AC
  Administered 2022-09-21: 1000 mg via ORAL
  Filled 2022-09-21: qty 2

## 2022-09-21 MED ORDER — INSULIN ASPART 100 UNIT/ML IJ SOLN
0.0000 [IU] | INTRAMUSCULAR | Status: DC | PRN
  Filled 2022-09-21: qty 0.07

## 2022-09-21 MED ORDER — AMISULPRIDE (ANTIEMETIC) 5 MG/2ML IV SOLN: 10.0000 mg | Freq: Once | INTRAVENOUS | Status: DC | PRN

## 2022-09-21 MED ORDER — FENTANYL CITRATE (PF) 100 MCG/2ML IJ SOLN: 25.0000 ug | INTRAMUSCULAR | Status: DC | PRN

## 2022-09-21 MED ORDER — EPHEDRINE SULFATE (PRESSORS) 50 MG/ML IJ SOLN
INTRAMUSCULAR | Status: DC | PRN
Start: 2022-09-21 — End: 2022-09-21
  Administered 2022-09-21: 10 mg via INTRAVENOUS
  Administered 2022-09-21: 15 mg via INTRAVENOUS

## 2022-09-21 MED ORDER — CHLORHEXIDINE GLUCONATE 0.12 % MT SOLN: 15.0000 mL | Freq: Once | OROMUCOSAL | Status: AC

## 2022-09-21 MED ORDER — FENTANYL CITRATE (PF) 100 MCG/2ML IJ SOLN
INTRAMUSCULAR | Status: DC | PRN
Start: 2022-09-21 — End: 2022-09-21
  Administered 2022-09-21: 50 ug via INTRAVENOUS

## 2022-09-21 NOTE — Interval H&P Note (Signed)
History and Physical Interval Note:  09/21/2022 7:56 AM  Stephanie [name]  has presented today for surgery, with the diagnosis of Iung nodules.  The various methods of treatment have been discussed with the patient and family. After consideration of risks, benefits and other options for treatment, the patient has consented to  Procedure(s): ROBOTIC ASSISTED NAVIGATIONAL BRONCHOSCOPY (Bilateral) as a surgical intervention.  The patient's history has been reviewed, patient examined, no change in status, stable for surgery.  I have reviewed the patient's chart and labs.  Questions were answered to the patient's satisfaction.     Rachel Bo Qamar Rosman

## 2022-09-21 NOTE — Anesthesia Postprocedure Evaluation (Signed)
Anesthesia Post Note  Patient: Stephanie [name]  Procedure(s) Performed: ABORTED CASE     Patient location during evaluation: PACU Anesthesia Type: General Level of consciousness: awake Pain management: pain level controlled Vital Signs Assessment: post-procedure vital signs reviewed and stable Respiratory status: spontaneous breathing, nonlabored ventilation and respiratory function stable Cardiovascular status: blood pressure returned to baseline and stable Postop Assessment: no apparent nausea or vomiting Comments: Patient had profound hypotension after induction and sinus bradycardia with concern for heart block. Case aborted per Dr. Tonia Brooms. Seen by cardiology in PACU and cleared for discharge. Patient denied chest pain and SOB. She moves all extremities and follows commands. She is oriented x3.   No notable events documented.  Last Vitals:  Vitals:   09/21/22 1200 09/21/22 1205  BP: (!) 175/66   Pulse: 60 63  Resp: (!) 23 (!) 21  Temp: 36.6 C   SpO2: 96% 98%    Last Pain:  Vitals:   09/21/22 1200  TempSrc:   PainSc: 0-No pain                 Linton Rump

## 2022-09-21 NOTE — Progress Notes (Signed)
Had bradycardia during bronchoscopy and is overdue for echo. Will arrange to be done at [facility].

## 2022-09-21 NOTE — Transfer of Care (Addendum)
Immediate Anesthesia Transfer of Care Note  Patient: Stephanie [name]  Procedure(s) Performed: ABORTED CASE  Patient Location: PACU  Anesthesia Type:General  Level of Consciousness: awake, alert , and oriented  Airway & Oxygen Therapy: Patient Spontanous Breathing and Patient connected to face mask  Post-op Assessment: Report given to RN and Post -op Vital signs reviewed and stable  Post vital signs: Reviewed and stable  Last Vitals:  Vitals Value Taken Time  BP 130/102 09/21/22 0945  Temp 36.5 C 09/21/22 0925  Pulse 66 09/21/22 0947  Resp 27 09/21/22 0947  SpO2 96 % 09/21/22 0947  Vitals shown include unfiled device data.  Last Pain:  Vitals:   09/21/22 0711  TempSrc:   PainSc: 0-No pain         Complications: No notable events documented.

## 2022-09-21 NOTE — Anesthesia Procedure Notes (Signed)
Procedure Name: Intubation Date/Time: 09/21/2022 8:33 AM  Performed by: Camillia Herter, CRNAPre-anesthesia Checklist: Patient identified, Emergency Drugs available, Suction available and Patient being monitored Patient Re-evaluated:Patient Re-evaluated prior to induction Oxygen Delivery Method: Circle System Utilized Preoxygenation: Pre-oxygenation with 100% oxygen Induction Type: IV induction Ventilation: Mask ventilation without difficulty Laryngoscope Size: Miller and 2 Grade View: Grade I Tube type: Oral Tube size: 8.5 mm Number of attempts: 1 Airway Equipment and Method: Stylet and Oral airway Placement Confirmation: ETT inserted through vocal cords under direct vision, positive ETCO2 and breath sounds checked- equal and bilateral Secured at: 22 cm Tube secured with: Tape Dental Injury: Teeth and Oropharynx as per pre-operative assessment

## 2022-09-21 NOTE — Progress Notes (Signed)
PCCM:  Patient underwent induction for anesthesia. The patients HR dropped into 30s. Appeared to have new heart block vs sinus brady with PACs and occasional junctional rhythm. Patients systolic BP dropped to 40s. BIS was 8. On 100 of propofol. ETCO2 20s.   I asked CRNA to call for anesthesiologist because I felt that the patient was close to arresting and asked for the propofol to be stopped.   12-Lead EKG was performed which was consistent with above.   I cancelled the patients procedure and I called and spoke with the patients daughter.   Josephine Igo, DO [facility] 09/21/2022 9:36 AM

## 2022-09-21 NOTE — Consult Note (Addendum)
Cardiology Consultation   Patient ID: Stephanie [name] MRN: [id number]; DOB: 23-Mar-1935  Admit date: 09/21/2022 Date of Consult: 09/21/2022  PCP:  [facility]   [facility] Providers Cardiologist:  Nona Dell, MD   {   Patient Profile:   Stephanie [name] is a 87 y.o. female with a hx of severe aortic stenosis status post TAVR in 2022, lung cancer, paroxysmal atrial fibrillation, nonobstructive CAD noted on catheterization in March 2022, COPD, hypertension, hyperlipidemia, CKD, presumed GI bleed in January 2021 (declined GI workup), type 2 diabetes who is being seen 09/21/2022 for the evaluation of bradycardia at the request of Dr. Tonia Brooms.  History of Present Illness:   Stephanie [name] has cardiac history consistent with severe aortic stenosis status post TAVR in 2022.  Most recent echocardiogram in June 2023 revealed stable prosthesis function.  Her EF has been normal throughout the years.  She also has history of paroxysmal atrial fibrillation with discontinuation of her Eliquis in March 2024 by Dr. [name], however it is unclear the reasoning of this.  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  She did not undergo any invasive studies or angiography.  Recommendations were to continue aggressive secondary prevention.  Currently, patient is being evaluated for lung cancer that was diagnosed in February 2024.  At the time she had atypical cells noted on biopsy and has underwent SBRT with Dr. Mitzi Hansen.  Today she underwent bronchoscopy and during procedure had reported bradycardia with heart rates down in the 30s and systolics in the 40s.  Rhythm at the time thought to be either heart block with sinus bradycardia with PACs and junctional beats.  There was concern for cardiac arrest at the time.  Propofol was stopped, and the procedure was aborted.  Patient was met at the PACU  and has not had any cardiac complaints.  She had denied any episodes of dizziness, syncope, chest pain, peripheral edema, shortness of breath, decreased exercise tolerance.  Repeat EKG was obtained that shows sinus bradycardia, heart rate 65.  PR 196.  Left anterior fascicular block.  No acute ST-T wave changes.   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 Excell Seltzer and Dr. Laneta Simmers.   Severe aortic stenosis    Symptomatic anemia    Presumed GI bleed January 2021 - she declined GI work-up   Type 2 diabetes mellitus [facility])     Past Surgical History:  Procedure Laterality Date   APPENDECTOMY     BACK SURGERY     BRONCHIAL BIOPSY  03/02/2022   Procedure: BRONCHIAL BIOPSIES;  Surgeon: Josephine Igo, DO;  Location: [facility];  Service: Pulmonary;;   BRONCHIAL BRUSHINGS  03/02/2022   Procedure: BRONCHIAL BRUSHINGS;  Surgeon: Josephine Igo, DO;  Location: [facility];  Service: Pulmonary;;   BRONCHIAL NEEDLE ASPIRATION BIOPSY  03/02/2022   Procedure: BRONCHIAL NEEDLE ASPIRATION BIOPSIES;  Surgeon: Josephine Igo, DO;  Location: [facility] ENDOSCOPY;  Service: Pulmonary;;   CYST REMOVAL HAND  Right, hospital in [place]   FIDUCIAL MARKER PLACEMENT  03/02/2022   Procedure: FIDUCIAL MARKER PLACEMENT;  Surgeon: Josephine Igo, DO;  Location: [facility]OSCOPY;  Service: Pulmonary;;   INTRAOPERATIVE TRANSTHORACIC ECHOCARDIOGRAM Left 07/01/2020   Procedure: INTRAOPERATIVE TRANSTHORACIC ECHOCARDIOGRAM;  Surgeon: Tonny Bollman, MD;  Location: [facility] OR;  Service: Open Heart Surgery;  Laterality: Left;   KNEE ARTHROSCOPY WITH MEDIAL MENISECTOMY Right 02/28/2012   Procedure:  KNEE ARTHROSCOPY WITH MEDIAL MENISECTOMY;  Surgeon: Vickki Hearing, MD;  Location: [facility]S;  Service: Orthopedics;  Laterality: Right;   REPAIR KNEE LIGAMENT     RIGHT/LEFT HEART CATH AND CORONARY ANGIOGRAPHY N/A 04/10/2020   Procedure: RIGHT/LEFT HEART CATH AND CORONARY ANGIOGRAPHY;  Surgeon: Corky Crafts, MD;  [facility] INV[facility]IVE CV LAB;  Service: Cardiovascular;  Laterality: N/A;   TRANSCATHETER AORTIC VALVE REPLACEMENT, TRANSFEMORAL N/A 07/01/2020   Procedure: TRANSCATHETER AORTIC VALVE REPLACEMENT, TRANSFEMORAL;  Surgeon: Tonny Bollman, MD;  Location: [facility] OR;  Service: Open Heart Surgery;  Laterality: N/A;   ULTRASOUND GUIDANCE FOR VASCULAR ACCESS Bilateral 07/01/2020   Procedure: ULTRASOUND GUIDANCE FOR VASCULAR ACCESS;  Surgeon: Tonny Bollman, MD;  Location: [facility] OR;  Service: Open Heart Surgery;  Laterality: Bilateral;     Inpatient Medications: Scheduled Meds:  Continuous Infusions:  lactated ringers 10 mL/hr at 09/21/22 0820   PRN Meds: amisulpride, fentaNYL (SUBLIMAZE) injection, insulin aspart, oxyCODONE **OR** oxyCODONE  Allergies:    Allergies  Allergen Reactions   Amoxicillin Swelling    Tongue swelling    Penicillins Swelling    Has patient had a PCN reaction causing immediate rash, facial/tongue/throat swelling, SOB or lightheadedness with hypotension: No Has patient had a PCN reaction causing severe rash involving mucus membranes or skin necrosis: No Has patient had a PCN reaction that required hospitalization: No Has patient had a PCN reaction occurring within the last 10 years: No If all of the above answers are "NO", then may proceed with Cephalosporin use.    Sulfa Antibiotics Swelling    Lip swelling   Nitrofurantoin Nausea And Vomiting    Social History:   Social History   Socioeconomic History   Marital status: Divorced    Spouse name: Not on file   Number of children: 3   Years of education: Not on file   Highest education level: Not  on file  Occupational History   Occupation: Retired-cook   Tobacco Use   Smoking status: Former    Current packs/day: 1.00    Types: Cigarettes    Passive exposure: Never   Smokeless tobacco: Never   Tobacco comments:    Quit over 40 years ago.  Does not know how many years she smoked or start/stop date  Vaping Use   Vaping status: Never Used  Substance and Sexual Activity   Alcohol use: No   Drug use: No   Sexual activity: Never  Other Topics Concern   Not on file  Social History Narrative   ** Merged History Encounter **       Social Determinants of Health   Financial Resource Strain: Not on file  Food Insecurity: No Food Insecurity (03/12/2022)   Hunger Vital Sign    Worried About Running Out of Food in the Last Year: Never true    Ran Out of Food in the Last Year: Never true  Transportation Needs: No Transportation Needs (03/12/2022)   PRAPARE - Administrator, Civil Service (Medical): No    Lack of Transportation (Non-Medical): No  Physical Activity:  Not on file  Stress: Not on file  Social Connections: Not on file  Intimate Partner Violence: Not At Risk (03/12/2022)   Humiliation, Afraid, Rape, and Kick questionnaire    Fear of Current or Ex-Partner: No    Emotionally Abused: No    Physically Abused: No    Sexually Abused: No    Family History:   Family History  Problem Relation Age of Onset   Lung disease Other    Cancer Other    Arthritis Other    Asthma Other    Diabetes Other    Diabetes Mother    Hypertension Mother    Diverticulitis Mother    Diabetes Father    Hypertension Sister    Diabetes Brother    Colon cancer Neg Hx      ROS:  Please see the history of present illness.  All other ROS reviewed and negative.     Physical Exam/Data:   Vitals:   09/21/22 1015 09/21/22 1030 09/21/22 1045 09/21/22 1100  BP: (!) 158/75 (!) 173/69 (!) 173/75 (!) 171/76  Pulse: 61 60 69 72  Resp: (!) 24 (!) 23 17 18   Temp:      TempSrc:       SpO2: 99% 96% 99% 97%  Weight:      Height:        Intake/Output Summary (Last 24 hours) at 09/21/2022 1110 Last data filed at 09/21/2022 0920 Gross per 24 hour  Intake 400 ml  Output 0 ml  Net 400 ml      09/21/2022    6:30 AM 09/18/2022    5:56 PM 09/16/2022    3:10 PM  Last 3 Weights  Weight (lbs) 225 lb 225 lb 194 lb 9.6 oz  Weight (kg) 102.059 kg 102.059 kg 88.27 kg     Body mass index is 37.44 kg/m.  General:  Well nourished, well developed, in no acute distress HEENT: normal Neck: no JVD Vascular: No carotid bruits; Distal pulses 2+ bilaterally Cardiac:  normal S1, S2; RRR; no murmur  Lungs:  +crackles  Abd: soft, nontender, no hepatomegaly  Ext: no edema Musculoskeletal:  No deformities, BUE and BLE strength normal and equal Skin: warm and dry  Neuro:  CNs 2-12 intact, no focal abnormalities noted Psych:  Normal affect   EKG:  The EKG was personally reviewed and demonstrates:  Repeat EKG was obtained that shows sinus bradycardia, heart rate 65.  PR 196.  Left anterior fascicular block.  No acute ST-T wave changes. Telemetry:  Telemetry was personally reviewed and demonstrates:  NSR HR 60s.   Relevant CV Studies: Echocardiogram 07/02/2021  1. Left ventricular ejection fraction, by estimation, is 50 to 55%. Left  ventricular ejection fraction by 2D MOD biplane is 51.9 %. The left  ventricle has low normal function. The left ventricle has no regional wall  motion abnormalities. There is  moderate left ventricular hypertrophy. Left ventricular diastolic  parameters are consistent with Grade I diastolic dysfunction (impaired  relaxation).   2. Right ventricular systolic function is normal. The right ventricular  size is normal. There is mildly elevated pulmonary artery systolic  pressure. The estimated right ventricular systolic pressure is 38.3 mmHg.   3. Left atrial size was moderately dilated.   4. Right atrial size was moderately dilated.   5. The mitral valve is  abnormal. Trivial mitral valve regurgitation.   6. The aortic valve has been repaired/replaced. Aortic valve  regurgitation is not visualized. There is a 26 mm  Sapien prosthetic (TAVR)  valve present in the aortic position. Procedure Date: 07/01/2020. Echo  findings are consistent with normal structure  and function of the aortic valve prosthesis. Aortic valve area, by VTI  measures 1.83 cm. Aortic valve mean gradient measures 8.3 mmHg. Aortic  valve Vmax measures 2.10 m/s.   Comparison(s): Changes from prior study are noted. 08/07/2020: LVEF 45-50%,  TAVR - mean gradient 9 mmHg.    Laboratory Data:  High Sensitivity Troponin:   Recent Labs  Lab 09/07/22 0329  TROPONINIHS 22*     ChemistryNo results for input(s): "NA", "K", "CL", "CO2", "GLUCOSE", "BUN", "CREATININE", "CALCIUM", "MG", "GFRNONAA", "GFRAA", "ANIONGAP" in the last 168 hours.  No results for input(s): "PROT", "ALBUMIN", "AST", "ALT", "ALKPHOS", "BILITOT" in the last 168 hours. Lipids No results for input(s): "CHOL", "TRIG", "HDL", "LABVLDL", "LDLCALC", "CHOLHDL" in the last 168 hours.  HematologyNo results for input(s): "WBC", "RBC", "HGB", "HCT", "MCV", "MCH", "MCHC", "RDW", "PLT" in the last 168 hours. Thyroid No results for input(s): "TSH", "FREET4" in the last 168 hours.  BNPNo results for input(s): "BNP", "PROBNP" in the last 168 hours.  DDimer No results for input(s): "DDIMER" in the last 168 hours.   Radiology/Studies:  DG C-Arm 1-60 Min-No Report  Result Date: 09/21/2022 Fluoroscopy was utilized by the requesting physician.  No radiographic interpretation.     Assessment and Plan:   Lung cancer status post bronchoscopy with intraoperative bradycardia During procedure patient had heart rates dropping into the 30s with concern of new heart block versus sinus bradycardia with PACs/junctional rhythm.  Systolics had also dropped in the 40s.  Propranolol was stopped and procedure was aborted.  Postoperative EKG was  repeated and shows normal sinus rhythm with left anterior fascicular block with PACs.  Patient without any prior cardiac symptoms.  She denied any dizziness, syncope, feelings of lightheadedness, palpitations, decreased exercise intolerance.  No evidence of complete heart block or any other arrhythmias at this time.  Bradycardia episode might just be a complication of anesthesia.  Will obtain echocardiogram to be done outpatient since this also overdue. Will discuss with MD about any further recommendations. Chest x-ray ordered for crackles and risk of aspiration.  On telemetry at the bedside but not able to view history.  Follow up has been made.   Status post TAVR 2022 Echo in June 2023 that noted EF 50 to 55% with normal RV function.  She had mild dilatation of both atria.  Echo at that time noted normal structure and function of valve prosthesis.  VTI 1.83.  Mean gradient 8.3.  Needs to have updated echocardiogram and endocarditis prophylaxis.  Paroxysmal atrial fibrillation Asymptomatic was taken off Eliquis in March 2024 for unknown reasons.   Risk Assessment/Risk Scores:    CHA2DS2-VASc Score = 6  This indicates a 9.7% annual risk of stroke. The patient's score is based upon: CHF History: 0 HTN History: 1 Diabetes History: 1 Stroke History: 0 Vascular Disease History: 1 Age Score: 2 Gender Score: 1     For questions or updates, please contact [facility] Please consult [email or web address] for contact info under    Signed, Abagail Kitchens, PA-C  09/21/2022 11:10 AM

## 2022-09-23 ENCOUNTER — Other Ambulatory Visit (HOSPITAL_COMMUNITY): Payer: Self-pay | Admitting: *Deleted

## 2022-09-23 ENCOUNTER — Ambulatory Visit (HOSPITAL_COMMUNITY)
Admission: RE | Admit: 2022-09-23 | Discharge: 2022-09-23 | Disposition: A | Discharge status: Home / Self Care | Payer: Medicare HMO | Source: Ambulatory Visit | Attending: Cardiology | Admitting: Cardiology

## 2022-09-23 DIAGNOSIS — Z952 Presence of prosthetic heart valve: Secondary | ICD-10-CM | POA: Diagnosis present

## 2022-09-23 LAB — ECHOCARDIOGRAM COMPLETE
AR max vel: 1.52 cm2
AV Area VTI: 1.28 cm2
AV Area mean vel: 1.39 cm2
AV Mean grad: 9 mmHg
AV Peak grad: 15.4 mmHg
Ao pk vel: 1.96 m/s
Area-P 1/2: 2.24 cm2
Calc EF: 50.8 %
S' Lateral: 3.1 cm
Single Plane A2C EF: 51.5 %
Single Plane A4C EF: 50.8 %

## 2022-09-23 LAB — VAS US ABI WITH/WO TBI
Left ABI: 0.58
Right ABI: 0.76

## 2022-09-23 NOTE — Progress Notes (Signed)
*  PRELIMINARY RESULTS* Echocardiogram 2D Echocardiogram has been performed.  Stephanie [name] 09/23/2022, 12:22 PM

## 2022-09-28 ENCOUNTER — Ambulatory Visit: Payer: Medicare HMO | Admitting: Acute Care

## 2022-09-29 ENCOUNTER — Other Ambulatory Visit: Payer: Self-pay | Admitting: Nurse Practitioner

## 2022-09-29 ENCOUNTER — Ambulatory Visit: Payer: Medicare HMO | Attending: Nurse Practitioner | Admitting: Nurse Practitioner

## 2022-09-29 ENCOUNTER — Encounter: Payer: Self-pay | Admitting: Nurse Practitioner

## 2022-09-29 ENCOUNTER — Ambulatory Visit: Payer: Medicare HMO

## 2022-09-29 ENCOUNTER — Telehealth: Payer: Self-pay | Admitting: Internal Medicine

## 2022-09-29 VITALS — BP 142/83 | HR 80 | Ht 64.0 in

## 2022-09-29 DIAGNOSIS — I498 Other specified cardiac arrhythmias: Secondary | ICD-10-CM | POA: Diagnosis not present

## 2022-09-29 DIAGNOSIS — I517 Cardiomegaly: Secondary | ICD-10-CM

## 2022-09-29 DIAGNOSIS — I480 Paroxysmal atrial fibrillation: Secondary | ICD-10-CM

## 2022-09-29 DIAGNOSIS — E785 Hyperlipidemia, unspecified: Secondary | ICD-10-CM

## 2022-09-29 DIAGNOSIS — Z952 Presence of prosthetic heart valve: Secondary | ICD-10-CM

## 2022-09-29 DIAGNOSIS — I10 Essential (primary) hypertension: Secondary | ICD-10-CM

## 2022-09-29 DIAGNOSIS — R001 Bradycardia, unspecified: Secondary | ICD-10-CM

## 2022-09-29 DIAGNOSIS — N183 Chronic kidney disease, stage 3 unspecified: Secondary | ICD-10-CM

## 2022-09-29 MED ORDER — LISINOPRIL 2.5 MG PO TABS
2.5000 mg | ORAL_TABLET | Freq: Every day | ORAL | 3 refills | Status: DC
Start: 2022-09-29 — End: 2023-08-12

## 2022-09-29 MED ORDER — BLOOD PRESSURE MONITOR DEVI: 1.0000 | Freq: Every day | 0 refills | Status: DC

## 2022-09-29 NOTE — Telephone Encounter (Signed)
Checking percert on the following patient for testing  Zio monitor. 14 days

## 2022-09-29 NOTE — Progress Notes (Signed)
Office Visit    Patient Name: Stephanie [name] Date of Encounter: 09/29/2022  PCP:  [facility]   [facility]  Cardiologist:  Nona Dell, MD  Advanced Practice Provider:  No care team member to display Electrophysiologist:  None   Chief Complaint and HPI    Stephanie [name] is a 87 y.o. female with a hx of severe aortic stenosis, s/p TAVR in 2022, PAF, COPD, hypertension, mixed hyperlipidemia, chronic kidney disease, hx of lung cancer (receiving tx), and presumed GI bleed in January 21, type 2 diabetes, who presents today for follow-up.  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  Last seen by Dr. Diona Browner on October 14, 2021.  She did admit to sharp sensation of left-sided chest discomfort that prompted ER evaluation prior to office visit.  CT of chest arranged showed an lung nodule on right lower lobe was concerning for neoplasm.  Was suggested to refer patient to [facility] pulmonary for further evaluation.  I saw her for 8-month follow-up on April 12, 2022.  Was doing well. Was receiving SBRT tx for lung cancer. Had one more treatment to go. Denied any chest pain, shortness of breath, palpitations, syncope, presyncope, dizziness, orthopnea, PND, swelling or significant weight changes, acute bleeding, or claudication.   ED visit on September 07, 2022 for evaluation of pleuritic chest pain and shortness of breath.  Denied any chest trauma.  CT of chest, abdomen, and pelvis noted below.  There was nothing acute noted however there was a right lower pulmonary nodule that has substantially enlarged from previous study was now considered a mixed solid and subsolid mass with solid component.  There was no lymphadenopathy or definite signs of metastatic disease outside of the thorax at that time.  CT also revealed evidence of colonic diverticulosis, stable left adrenal nodule felt to be likely benign lesions, hepatic  steatosis.  CXR with opacities in right lower lung, felt to be atypical infection or aspiration.  Was told to follow-up with pulmonology outpatient.  Repeat bronchoscopy and biopsy were arranged.  Underwent robotic assisted bronchoscopy on September 21, 2022.  She was noted to have acute bradycardia post anesthesia.  EKG appeared like ectopic rhythm with PACs, was found to be in sinus rhythm in 60s with PACs on telemetry.  Was not found to be in heart block.  It was suspected that bradycardia was in the setting of anesthesia.  Recommended outpatient echocardiogram and follow-up with cardiology.  It was recommended that if there was no structural heart disease, could reschedule procedure.  Echocardiogram on September 23, 2022 revealed EF 55 to 60%, severe LVH, stable function of aortic valve replacement.  Today she presents for hospital follow-up. Doing well. Denies any complaints. Denies any chest pain, shortness of breath, syncope, presyncope, dizziness, orthopnea, PND, swelling or significant weight changes, acute bleeding, or claudication. BP averaging 150's at home. Does sometimes sense sensation of "skipping beat," doesn't last long, rare per her report.   ROS: See HPI. Rest of ROS negative.   EKGs/Labs/Other Studies Reviewed:   The following studies were reviewed today:   EKG:  EKG Interpretation Date/Time:  Wednesday September 29 2022 10:40:34 EDT Ventricular Rate:  69 PR Interval:    QRS Duration:  82 QT Interval:  410 QTC Calculation: 439 R Axis:   -49  Text Interpretation: Sinus arrhythmia with frequent PAC's Left anterior fascicular block Moderate voltage criteria for LVH, may be normal variant ( R in aVL ,  Cornell product ) Anterolateral infarct (cited on or before 21-Sep-2022) When compared with ECG of 21-Sep-2022 08:55, QT has lengthened Confirmed by Sharlene Dory [id number]) on 10/01/2022 12:37:29 PM   Echo 09/2022:  1. Left ventricular ejection fraction, by estimation, is 55 to  60%. The  left ventricle has normal function. The left ventricle has no regional  wall motion abnormalities. There is severe left ventricular hypertrophy.  Left ventricular diastolic parameters   are consistent with Grade I diastolic dysfunction (impaired relaxation).  Elevated left atrial pressure.   2. Right ventricular systolic function is normal. The right ventricular  size is normal. Tricuspid regurgitation signal is inadequate for assessing  PA pressure.   3. Left atrial size was mildly dilated.   4. The mitral valve is normal in structure. No evidence of mitral valve  regurgitation. No evidence of mitral stenosis.   5. Edwards Sapien 3 Ultra THV size 26 mm is in the AV position. .The aortic valve has been repaired/replaced. Aortic valve regurgitation is not visualized. No aortic stenosis is present.   6. The inferior vena cava is normal in size with greater than 50%  respiratory variability, suggesting right atrial pressure of 3 mmHg.  Echo 06/2021:  1. Left ventricular ejection fraction, by estimation, is 50 to 55%. Left  ventricular ejection fraction by 2D MOD biplane is 51.9 %. The left  ventricle has low normal function. The left ventricle has no regional wall  motion abnormalities. There is  moderate left ventricular hypertrophy. Left ventricular diastolic  parameters are consistent with Grade I diastolic dysfunction (impaired  relaxation).   2. Right ventricular systolic function is normal. The right ventricular  size is normal. There is mildly elevated pulmonary artery systolic  pressure. The estimated right ventricular systolic pressure is 38.3 mmHg.   3. Left atrial size was moderately dilated.   4. Right atrial size was moderately dilated.   5. The mitral valve is abnormal. Trivial mitral valve regurgitation.   6. The aortic valve has been repaired/replaced. Aortic valve  regurgitation is not visualized. There is a 26 mm Sapien prosthetic (TAVR)  valve present in the  aortic position. Procedure Date: 07/01/2020. Echo  findings are consistent with normal structure  and function of the aortic valve prosthesis. Aortic valve area, by VTI  measures 1.83 cm. Aortic valve mean gradient measures 8.3 mmHg. Aortic  valve Vmax measures 2.10 m/s.   Comparison(s): Changes from prior study are noted. 08/07/2020: LVEF 45-50%,  TAVR - mean gradient 9 mmHg.  Risk Assessment/Calculations:   CHA2DS2-VASc Score = 5  This indicates a 7.2% annual risk of stroke. The patient's score is based upon: CHF History: 0 HTN History: 1 Diabetes History: 1 Stroke History: 0 Vascular Disease History: 0 Age Score: 2 Gender Score: 1   Review of Systems    All other systems reviewed and are otherwise negative except as noted above.  Physical Exam    VS:  BP (!) 142/83 (BP Location: Left Arm, Patient Position: Sitting, Cuff Size: Normal)   Pulse 80   Ht 5\' 4"  (1.626 m)   BMI 38.62 kg/m  , BMI Body mass index is 38.62 kg/m.  Wt Readings from Last 3 Encounters:  09/30/22 225 lb (102.1 kg)  09/21/22 225 lb (102.1 kg)  09/18/22 225 lb (102.1 kg)     GEN: Obese, 87 y.o. female, in no acute distress. HEENT: normal. Neck: Supple, no JVD, carotid bruits, or masses. Cardiac: S1/S2, RRR, Grade 2/6 murmur, no rubs, no gallops. No  clubbing, cyanosis, edema.  Radials/PT 2+ and equal bilaterally.  Respiratory:  Respirations regular and unlabored, clear to auscultation bilaterally. MS: No deformity or atrophy. Skin: Warm and dry, no rash. Neuro:  Strength and sensation are intact. Psych: Normal affect.  Assessment & Plan    Bradycardia, arrhythmia Noted to have up acute bradycardia while undergoing robotic assisted bronchoscopy, was suspected that her bradycardia was in the setting of anesthesia. EKG appeared like ectopic rhythm with PACs, was found to be in sinus rhythm in 60s with PACs on telemetry.  Was not found to be in heart block.  EKG today initially falsely read rhythm  as A-fib, reviewed EKG today with Dr. Diona Browner via secure chat and both confirmed EKG shows noted P waves with frequent PACs.  Will obtain 14-day ZIO XT monitor for further evaluation. Care and ED precautions discussed.   Severe AS, s/p TAVR in 2022 TTE 09/2022  revealed stable aortic valve prosthesis function. Stable and denies any concerning symptoms. Grade 2/6 murmur noted on exam. Continue current meds. SBE prophylaxis discussed and pt verbalized understanding. Heart healthy diet and regular cardiovascular exercise as tolerated encouraged.   PAF Denies any tachycardia or palpitations.  Heart rate 80 today. Sinus rhythm today. Continue current medication regimen. No longer on OAC d/t reported hx of bleeding. Continue carvedilol. Heart healthy diet and regular cardiovascular exercise as tolerated encouraged.   Left ventricular hypertrophy, HTN Recent echo revealed severe LVH in setting of poor blood pressure control.. Blood pressure elevated and not at goal. SBP < 140.  Will start lisinopril 2.5 mg daily. Will obtain BMET in 1 week. Continue current medication regimen. Given BP log and discussed to monitor BP at home at least 2 hours after medications and sitting for 5-10 minutes and to let us know her readings in 1-2 weeks. Heart healthy diet encouraged.   HLD Last LDL obtained in 2022 was WNL.  Currently not on any lipid-lowering medications. Not addressed, but at next office visit, recommend to discuss obtaining FLP/requesting labs from PCP. Heart healthy diet and regular cardiovascular exercise as tolerated encouraged.   CKD 3 Stable kidney function seen with most recent lab work.  Avoid nephrotoxic agents.  Encourage adequate hydration.  Continue current medication regimen.  Will obtain BMET as previously mentioned. Continue follow-up with PCP.  Disposition: Follow up in 6 weeks with Nona Dell, MD or APP. Plan to address pre-op clearance at next office visit.    Signed, Sharlene Dory, NP

## 2022-09-29 NOTE — Patient Instructions (Addendum)
Medication Instructions:  Your physician has recommended you make the following change in your medication:  Start taking lisinopril 2.5 Mg daily  Continue all other medications as prescribed   Labwork: BMET  Testing/Procedures: Your physician has recommended that you wear a Zio monitor.   This monitor is a medical device that records the heart's electrical activity. Doctors most often use these monitors to diagnose arrhythmias. Arrhythmias are problems with the speed or rhythm of the heartbeat. The monitor is a small device applied to your chest. You can wear one while you do your normal daily activities. While wearing this monitor if you have any symptoms to push the button and record what you felt. Once you have worn this monitor for the period of time provider prescribed (for 14 days), you will return the monitor device in the postage paid box. Once it is returned they will download the data collected and provide [place] with a report which the provider will then review and we will call you with those results. Important tips:  Avoid showering during the first 24 hours of wearing the monitor. Avoid excessive sweating to help maximize wear time. Do not submerge the device, no hot tubs, and no swimming pools. Keep any lotions or oils away from the patch. After 24 hours you may shower with the patch on. Take brief showers with your back facing the shower head.  Do not remove patch once it has been placed because that will interrupt data and decrease adhesive wear time. Push the button when you have any symptoms and write down what you were feeling. Once you have completed wearing your monitor, remove and place into box which has postage paid and place in your outgoing mailbox.  If for some reason you have misplaced your box then call our office and we can provide another box and/or mail it off for you.   Follow-Up: Your physician recommends that you schedule a follow-up appointment in: 6  weeks  Any Other Special Instructions Will Be Listed Below (If Applicable).    If you need a refill on your cardiac medications before your next appointment, please call your pharmacy.

## 2022-09-29 NOTE — Progress Notes (Unsigned)
Stephanie [name], female    DOB: November 28, 1935,   MRN: [id number]   Brief patient profile:  35 yobf  quit smoking 2000 with doe then  referred to pulmonary clinic in [place]  03/31/2021 by Philomena Course for spn     History of Present Illness  03/31/2021  Pulmonary/ 1st [name] eval/ Stephanie [name] / [place] [name]  Chief Complaint  Patient presents with   Consult    CT done in Feb 2023 showed pulmonary nodules.   Dyspnea:  can still do [employer] but usually uses scooter due to back  Also [place] slow pace s doe  Cough: none  Sleep: 2 pillows bed is flat SABA use: neb not even once a month  Rec We will schedule you for PFTs > not done as of 11/13/2021  also contact you in the future for CT @ 1 years from the last.  Call sooner for worse breathing or chest pain when you breathe in or cough that won't go away after a few weeks.   11/13/2021  f/u ov/[place] [name] re: Nodule f/u maint on no resp rx   Chief Complaint  Patient presents with   Follow-up    Lung nodule seen on CT   Dyspnea: rides scooter for years due to back / walks next door to sister with rollator  Cough: none  Sleeping: 2 pillows/ flat bed  SABA use: has neb, not using  02: none  Covid status: vax / never infected  Rec My [name] will be contacting you by phone for referral to [name] for PFTs and same day visit with Dr Regenia Skeeter or [place]  > dx PET pos RLL SPN> SBRT planned       04/07/2022  f/u ov/Stephanie [name] re: GOLD 3 copd  maint on no rx  / SBRT starting 04/08/22  Chief Complaint  Patient presents with   Follow-up    Lung nodule.  Starting chemotherapy tomorrow at [facility].  Patient states wheezing last week.  Seen at [facility]. Does not know rx given.  Dyspnea:  balance is limiting / walks with rollator to sisters house next door  Cough: none  Sleeping: bed is flat 2 pillows or loses breath SABA use: neb prn bad attacks  02: none  Rec Albuterol 2.5 mg with budesonide 0.25 mg  twice daily in nebulizer  routinely (automatically)  and up to every 4 hours if needed Please schedule a follow up visit in 3 months but call sooner if needed in [place] [name]     09/30/2022  f/u ov/Stephanie [name] re: GOLD 3 copd  maint on alb/bud bid and rarely  needs prn saba  Chief Complaint  Patient presents with   Follow-up    Follow up , pt was not able to do bx b/c her heart rate was dropping at the time of producer   Dyspnea:  rarely limited but very sedentary  Cough: none  Sleeping: flat bed 2 pillows s  resp cc  SABA use: rarely  02: none    No obvious day to day or daytime variability or assoc excess/ purulent sputum or mucus plugs or hemoptysis or cp or chest tightness, subjective wheeze or overt sinus or hb symptoms.    Also denies any obvious fluctuation of symptoms with weather or environmental changes or other aggravating or alleviating factors except as outlined above   No unusual exposure hx or h/o childhood pna/ asthma or knowledge of premature birth.  Current Allergies, Complete  Past Medical History, Past Surgical History, Family History, and Social History were reviewed in [employer] record.  ROS  The following are not active complaints unless bolded Hoarseness, sore throat, dysphagia, dental problems, itching, sneezing,  nasal congestion or discharge of excess mucus or purulent secretions, ear ache,   fever, chills, sweats, unintended wt loss or wt gain, classically pleuritic or exertional cp,  orthopnea pnd or arm/hand swelling  or leg swelling, presyncope, palpitations, abdominal pain, anorexia, nausea, vomiting, diarrhea  or change in bowel habits or change in bladder habits, change in stools or change in urine, dysuria, hematuria,  rash, arthralgias, visual complaints, headache, numbness, weakness or ataxia or problems with walking or coordination,  change in mood or  memory.        Current Meds  Medication Sig   albuterol  (PROVENTIL) (2.5 MG/3ML) 0.083% nebulizer solution USE ONE AMPULE VIA NEBULIZER EVERY 4 HOURS AS NEEDED SHORTNESS OF BREATH OR WHEEZING   BD PEN NEEDLE NANO U/F 32G X 4 MM MISC See admin instructions.   Blood Pressure Monitor DEVI 1 each by Does not apply route daily.   budesonide (PULMICORT) 0.25 MG/2ML nebulizer solution One vial twice daily with albuterol   carvedilol (COREG) 6.25 MG tablet Take 1 tablet (6.25 mg total) by mouth 2 (two) times daily.   ferrous sulfate 325 (65 FE) MG EC tablet Take 325 mg by mouth daily with breakfast.   furosemide (LASIX) 40 MG tablet TAKE ONE TABLET BY MOUTH EVERY DAY   gabapentin (NEURONTIN) 100 MG capsule Take 100-200 mg by mouth at bedtime.   HYDROcodone-acetaminophen (NORCO/VICODIN) 5-325 MG tablet Take 2 tablets by mouth every 4 (four) hours as needed.   Insulin Detemir (LEVEMIR FLEXTOUCH) 100 UNIT/ML Pen Inject 30 Units into the skin at bedtime. (Patient taking differently: Inject 40 Units into the skin at bedtime.)   lisinopril (ZESTRIL) 2.5 MG tablet Take 1 tablet (2.5 mg total) by mouth daily.   metFORMIN (GLUCOPHAGE) 500 MG tablet Take 1 tablet (500 mg total) by mouth 2 (two) times daily.             Current Meds  Medication Sig   azithromycin (ZITHROMAX) 250 MG tablet Take 250 mg by mouth as directed.   BD PEN NEEDLE NANO U/F 32G X 4 MM MISC See admin instructions.   predniSONE (DELTASONE) 20 MG tablet Take 20 mg by mouth daily with breakfast. Take 3 tablets by mouth on first day and 1 tablet twice daily for 5 days Orally Twice a day for 6 days             Past Medical History:  Diagnosis Date   Arthritis    Asthma    CKD (chronic kidney disease)    Essential hypertension    Mixed hyperlipidemia    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 Excell Seltzer and Dr. Laneta Simmers.   Severe aortic stenosis    Symptomatic anemia    Presumed GI bleed January 2021 - she  declined GI work-up   Type 2 diabetes mellitus (HCC)         Objective:    Wts  09/30/2022       225   04/07/2022       189  11/13/21 181 lb 3.2 oz (82.2 kg)  10/14/21 183 lb 6.4 oz (83.2 kg)  10/13/21 225 lb (102.1 kg)    Vital signs  reviewed  09/30/2022  - Note at rest 02 sats  91% on RA   General appearance:    w/c bound obese bf nad    HEENT : Oropharynx  clear     NECK :  without  apparent JVD/ palpable Nodes/TM    LUNGS: no acc muscle use,  Min barrel  contour chest wall with bilateral  slightly decreased bs s audible wheeze and  without cough on insp or exp maneuvers and min  Hyperresonant  to  percussion bilaterally    CV: slt irreg  no s3  2-3 /6 SEM  or increase in P2, and no edema   ABD:  soft and nontender   MS:  Nl gait/ ext warm without deformities Or obvious joint restrictions  calf tenderness, cyanosis or clubbing     SKIN: warm and dry without lesions    NEURO:  alert, approp, nl sensorium with  no motor or cerebellar deficits apparent.                  Assessment

## 2022-09-30 ENCOUNTER — Ambulatory Visit: Payer: Medicare HMO | Admitting: Internal Medicine

## 2022-09-30 ENCOUNTER — Encounter: Payer: Self-pay | Admitting: Internal Medicine

## 2022-09-30 VITALS — BP 117/72 | HR 56 | Ht 64.0 in | Wt 225.0 lb

## 2022-09-30 DIAGNOSIS — J449 Chronic obstructive pulmonary disease, unspecified: Secondary | ICD-10-CM | POA: Diagnosis not present

## 2022-09-30 NOTE — Assessment & Plan Note (Addendum)
Quit smoking 2000 - PFT's  02/05/22  FEV1 0.91 (47 % ) ratio 0.70  (with shortened Texp)  p 3 % improvement from saba p 0 prior to study with DLCO  22 (115%)   and FV curve slt concave  with fev1/svc = 56% - 04/07/2022  After extensive coaching inhaler device,  effectiveness =    0% so try adding bud 0.25 mg to albuterol bid and prn   Not able to use inhalers effectively but doing fine on alb/bud bid per neb s limiting doe or aecopd so no changes needed   F/u in 6 m sooner prn          Each maintenance medication was reviewed in detail including emphasizing most importantly the difference between maintenance and prns and under what circumstances the prns are to be triggered using an action plan format where appropriate.  Total time for H and P, chart review, counseling, reviewing hfa/neb  device(s) and generating customized AVS unique to this office visit / same day charting = 28 min

## 2022-09-30 NOTE — Patient Instructions (Signed)
No change in medications    Please schedule a follow up visit in 6  months but call sooner if needed  

## 2022-10-01 ENCOUNTER — Encounter: Payer: Self-pay | Admitting: Nurse Practitioner

## 2022-10-04 ENCOUNTER — Telehealth: Payer: Self-pay | Admitting: Nurse Practitioner

## 2022-10-04 DIAGNOSIS — E538 Deficiency of other specified B group vitamins: Secondary | ICD-10-CM

## 2022-10-04 DIAGNOSIS — R0609 Other forms of dyspnea: Secondary | ICD-10-CM

## 2022-10-04 DIAGNOSIS — Z79899 Other long term (current) drug therapy: Secondary | ICD-10-CM

## 2022-10-04 DIAGNOSIS — N183 Chronic kidney disease, stage 3 unspecified: Secondary | ICD-10-CM

## 2022-10-04 DIAGNOSIS — E1165 Type 2 diabetes mellitus with hyperglycemia: Secondary | ICD-10-CM

## 2022-10-04 DIAGNOSIS — N1832 Chronic kidney disease, stage 3b: Secondary | ICD-10-CM

## 2022-10-04 DIAGNOSIS — I480 Paroxysmal atrial fibrillation: Secondary | ICD-10-CM

## 2022-10-04 DIAGNOSIS — E782 Mixed hyperlipidemia: Secondary | ICD-10-CM

## 2022-10-04 NOTE — Telephone Encounter (Signed)
Patient not taking blood thinner due to PCP taking her off of it and she will have BMET labs done next weeks

## 2022-10-04 NOTE — Telephone Encounter (Signed)
-----   Message from Sharlene Dory sent at 10/01/2022  1:04 PM EDT ----- Genevie Cheshire,   Please confirm with patient why she is no longer on blood thinner. I talked this over with another MD and it appears she had a bleeding issue.   Also let's add a BMET to be drawn in 1 week since I added lisinopril at last office visit.   Thanks!   Best,  Sharlene Dory, NP

## 2022-10-04 NOTE — Telephone Encounter (Signed)
Lab order changed and faxed over

## 2022-10-04 NOTE — Telephone Encounter (Signed)
Stephanie [name] is calling from [facility] stating the BMP order is for [facility].   She is requesting it be changes to [facility] so it can be performed.   Please advise.

## 2022-10-19 ENCOUNTER — Ambulatory Visit: Payer: Medicare HMO | Admitting: Acute Care

## 2022-11-16 ENCOUNTER — Ambulatory Visit: Payer: Medicare HMO | Attending: Nurse Practitioner | Admitting: Nurse Practitioner

## 2022-11-16 ENCOUNTER — Encounter: Payer: Self-pay | Admitting: Nurse Practitioner

## 2022-11-16 VITALS — BP 118/62 | HR 60 | Ht 65.0 in | Wt 225.0 lb

## 2022-11-16 DIAGNOSIS — Z952 Presence of prosthetic heart valve: Secondary | ICD-10-CM | POA: Diagnosis not present

## 2022-11-16 DIAGNOSIS — Z01810 Encounter for preprocedural cardiovascular examination: Secondary | ICD-10-CM

## 2022-11-16 DIAGNOSIS — R001 Bradycardia, unspecified: Secondary | ICD-10-CM | POA: Diagnosis not present

## 2022-11-16 DIAGNOSIS — N183 Chronic kidney disease, stage 3 unspecified: Secondary | ICD-10-CM

## 2022-11-16 DIAGNOSIS — I498 Other specified cardiac arrhythmias: Secondary | ICD-10-CM | POA: Diagnosis not present

## 2022-11-16 DIAGNOSIS — I480 Paroxysmal atrial fibrillation: Secondary | ICD-10-CM | POA: Diagnosis not present

## 2022-11-16 DIAGNOSIS — I517 Cardiomegaly: Secondary | ICD-10-CM

## 2022-11-16 DIAGNOSIS — E785 Hyperlipidemia, unspecified: Secondary | ICD-10-CM

## 2022-11-16 DIAGNOSIS — I10 Essential (primary) hypertension: Secondary | ICD-10-CM

## 2022-11-16 NOTE — Progress Notes (Unsigned)
Office Visit    Patient Name: Stephanie [name] Date of Encounter: 09/29/2022  PCP:  [facility]   [facility]  Cardiologist:  Nona Dell, MD  Advanced Practice Provider:  No care team member to display Electrophysiologist:  None   Chief Complaint and HPI    Stephanie [name] is a 87 y.o. female with a hx of severe aortic stenosis, s/p TAVR in 2022, PAF, COPD, hypertension, mixed hyperlipidemia, chronic kidney disease, hx of lung cancer (receiving tx), and presumed GI bleed in January 21, type 2 diabetes, who presents today for follow-up.  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  Last seen by Dr. Diona Browner on October 14, 2021.  She did admit to sharp sensation of left-sided chest discomfort that prompted ER evaluation prior to office visit.  CT of chest arranged showed an lung nodule on right lower lobe was concerning for neoplasm.  Was suggested to refer patient to [facility] pulmonary for further evaluation.  I saw her for 16-month follow-up on April 12, 2022.  Was doing well. Was receiving SBRT tx for lung cancer. Had one more treatment to go. Denied any chest pain, shortness of breath, palpitations, syncope, presyncope, dizziness, orthopnea, PND, swelling or significant weight changes, acute bleeding, or claudication.   ED visit on September 07, 2022 for evaluation of pleuritic chest pain and shortness of breath.  Denied any chest trauma.  CT of chest, abdomen, and pelvis noted below.  There was nothing acute noted however there was a right lower pulmonary nodule that has substantially enlarged from previous study was now considered a mixed solid and subsolid mass with solid component.  There was no lymphadenopathy or definite signs of metastatic disease outside of the thorax at that time.  CT also revealed evidence of colonic diverticulosis, stable left adrenal nodule felt to be likely benign lesions, hepatic  steatosis.  CXR with opacities in right lower lung, felt to be atypical infection or aspiration.  Was told to follow-up with pulmonology outpatient.  Repeat bronchoscopy and biopsy were arranged.  Underwent robotic assisted bronchoscopy on September 21, 2022.  She was noted to have acute bradycardia post anesthesia.  EKG appeared like ectopic rhythm with PACs, was found to be in sinus rhythm in 60s with PACs on telemetry.  Was not found to be in heart block.  It was suspected that bradycardia was in the setting of anesthesia.  Recommended outpatient echocardiogram and follow-up with cardiology.  It was recommended that if there was no structural heart disease, could reschedule procedure.  Echocardiogram on September 23, 2022 revealed EF 55 to 60%, severe LVH, stable function of aortic valve replacement.  Today she presents for hospital follow-up. Doing well. Denies any complaints. Denies any chest pain, shortness of breath, syncope, presyncope, dizziness, orthopnea, PND, swelling or significant weight changes, acute bleeding, or claudication. BP averaging 150's at home. Does sometimes sense sensation of "skipping beat," doesn't last long, rare per her report.   ROS: See HPI. Rest of ROS negative.   EKGs/Labs/Other Studies Reviewed:   The following studies were reviewed today:   EKG:  EKG Interpretation Date/Time:  Wednesday September 29 2022 10:40:34 EDT Ventricular Rate:  69 PR Interval:    QRS Duration:  82 QT Interval:  410 QTC Calculation: 439 R Axis:   -49  Text Interpretation: Sinus arrhythmia with frequent PAC's Left anterior fascicular block Moderate voltage criteria for LVH, may be normal variant ( R in aVL ,  Cornell product ) Anterolateral infarct (cited on or before 21-Sep-2022) When compared with ECG of 21-Sep-2022 08:55, QT has lengthened Confirmed by Sharlene Dory [phone]) on 10/01/2022 12:37:29 PM   Echo 09/2022:  1. Left ventricular ejection fraction, by estimation, is 55 to  60%. The  left ventricle has normal function. The left ventricle has no regional  wall motion abnormalities. There is severe left ventricular hypertrophy.  Left ventricular diastolic parameters   are consistent with Grade I diastolic dysfunction (impaired relaxation).  Elevated left atrial pressure.   2. Right ventricular systolic function is normal. The right ventricular  size is normal. Tricuspid regurgitation signal is inadequate for assessing  PA pressure.   3. Left atrial size was mildly dilated.   4. The mitral valve is normal in structure. No evidence of mitral valve  regurgitation. No evidence of mitral stenosis.   5. Edwards Sapien 3 Ultra THV size 26 mm is in the AV position. .The aortic valve has been repaired/replaced. Aortic valve regurgitation is not visualized. No aortic stenosis is present.   6. The inferior vena cava is normal in size with greater than 50%  respiratory variability, suggesting right atrial pressure of 3 mmHg.  Echo 06/2021:  1. Left ventricular ejection fraction, by estimation, is 50 to 55%. Left  ventricular ejection fraction by 2D MOD biplane is 51.9 %. The left  ventricle has low normal function. The left ventricle has no regional wall  motion abnormalities. There is  moderate left ventricular hypertrophy. Left ventricular diastolic  parameters are consistent with Grade I diastolic dysfunction (impaired  relaxation).   2. Right ventricular systolic function is normal. The right ventricular  size is normal. There is mildly elevated pulmonary artery systolic  pressure. The estimated right ventricular systolic pressure is 38.3 mmHg.   3. Left atrial size was moderately dilated.   4. Right atrial size was moderately dilated.   5. The mitral valve is abnormal. Trivial mitral valve regurgitation.   6. The aortic valve has been repaired/replaced. Aortic valve  regurgitation is not visualized. There is a 26 mm Sapien prosthetic (TAVR)  valve present in the  aortic position. Procedure Date: 07/01/2020. Echo  findings are consistent with normal structure  and function of the aortic valve prosthesis. Aortic valve area, by VTI  measures 1.83 cm. Aortic valve mean gradient measures 8.3 mmHg. Aortic  valve Vmax measures 2.10 m/s.   Comparison(s): Changes from prior study are noted. 08/07/2020: LVEF 45-50%,  TAVR - mean gradient 9 mmHg.  Risk Assessment/Calculations:   CHA2DS2-VASc Score = 5  This indicates a 7.2% annual risk of stroke. The patient's score is based upon: CHF History: 0 HTN History: 1 Diabetes History: 1 Stroke History: 0 Vascular Disease History: 0 Age Score: 2 Gender Score: 1   Review of Systems    All other systems reviewed and are otherwise negative except as noted above.  Physical Exam    VS:  There were no vitals taken for this visit. , BMI There is no height or weight on file to calculate BMI.  Wt Readings from Last 3 Encounters:  09/30/22 225 lb (102.1 kg)  09/21/22 225 lb (102.1 kg)  09/18/22 225 lb (102.1 kg)     GEN: Obese, 87 y.o. female, in no acute distress. HEENT: normal. Neck: Supple, no JVD, carotid bruits, or masses. Cardiac: S1/S2, RRR, Grade 2/6 murmur, no rubs, no gallops. No clubbing, cyanosis, edema.  Radials/PT 2+ and equal bilaterally.  Respiratory:  Respirations regular and unlabored, clear  to auscultation bilaterally. MS: No deformity or atrophy. Skin: Warm and dry, no rash. Neuro:  Strength and sensation are intact. Psych: Normal affect.  Assessment & Plan    Bradycardia, arrhythmia Noted to have up acute bradycardia while undergoing robotic assisted bronchoscopy, was suspected that her bradycardia was in the setting of anesthesia. EKG appeared like ectopic rhythm with PACs, was found to be in sinus rhythm in 60s with PACs on telemetry.  Was not found to be in heart block.  EKG today initially falsely read rhythm as A-fib, reviewed EKG today with Dr. Diona Browner via secure chat and both  confirmed EKG shows noted P waves with frequent PACs.  Will obtain 14-day ZIO XT monitor for further evaluation. Care and ED precautions discussed.   Severe AS, s/p TAVR in 2022 TTE 09/2022  revealed stable aortic valve prosthesis function. Stable and denies any concerning symptoms. Grade 2/6 murmur noted on exam. Continue current meds. SBE prophylaxis discussed and pt verbalized understanding. Heart healthy diet and regular cardiovascular exercise as tolerated encouraged.   PAF Denies any tachycardia or palpitations.  Heart rate 80 today. Sinus rhythm today. Continue current medication regimen. No longer on OAC d/t reported hx of bleeding. Continue carvedilol. Heart healthy diet and regular cardiovascular exercise as tolerated encouraged.   Left ventricular hypertrophy, HTN Recent echo revealed severe LVH in setting of poor blood pressure control.. Blood pressure elevated and not at goal. SBP < 140.  Will start lisinopril 2.5 mg daily. Will obtain BMET in 1 week. Continue current medication regimen. Given BP log and discussed to monitor BP at home at least 2 hours after medications and sitting for 5-10 minutes and to let us know her readings in 1-2 weeks. Heart healthy diet encouraged.   HLD Last LDL obtained in 2022 was WNL.  Currently not on any lipid-lowering medications. Not addressed, but at next office visit, recommend to discuss obtaining FLP/requesting labs from PCP. Heart healthy diet and regular cardiovascular exercise as tolerated encouraged.   CKD 3 Stable kidney function seen with most recent lab work.  Avoid nephrotoxic agents.  Encourage adequate hydration.  Continue current medication regimen.  Will obtain BMET as previously mentioned. Continue follow-up with PCP.   Pre-op clearance {Click Here to Calculate RCRI      :829562130}  { Click Here to Calculate DASI      :865784696} Ms. Lutz's perioperative risk of a major cardiac event is 6.6% according to the Revised Cardiac Risk  Index (RCRI).  Therefore, she is at high risk for perioperative complications.   Her functional capacity is {excellent/good/fair/poor:19665} at 4.4 METs according to the Duke Activity Status Index (DASI). Recommendations: {2014 ACC/AHA Perioperative Guidelines  :21036001} Antiplatelet and/or Anticoagulation Recommendations: {Antiplatelet Recommendations                  :21036016} {Anticoagulation Recommendations           :29528413}    Request labs from PCP, fax labs to [place] or from [facility] when she goes next month.  No med changes.  F/u with Dr. Diona Browner on 6 months.  Run by Dr. Diona Browner about clearance.  Send note to pulmonology.   Disposition: Follow up in 6 weeks with Nona Dell, MD or APP. Plan to address pre-op clearance at next office visit.    Signed, Sharlene Dory, NP

## 2022-11-16 NOTE — Patient Instructions (Addendum)

## 2022-12-01 ENCOUNTER — Other Ambulatory Visit (HOSPITAL_COMMUNITY): Payer: Self-pay | Admitting: Nephrology

## 2022-12-01 DIAGNOSIS — N1832 Chronic kidney disease, stage 3b: Secondary | ICD-10-CM

## 2022-12-13 ENCOUNTER — Ambulatory Visit (HOSPITAL_COMMUNITY)
Admission: RE | Admit: 2022-12-13 | Discharge: 2022-12-13 | Disposition: A | Discharge status: Home / Self Care | Payer: Medicare HMO | Source: Ambulatory Visit | Attending: Nephrology | Admitting: Nephrology

## 2022-12-13 DIAGNOSIS — N1832 Chronic kidney disease, stage 3b: Secondary | ICD-10-CM | POA: Insufficient documentation

## 2022-12-22 ENCOUNTER — Other Ambulatory Visit (INDEPENDENT_AMBULATORY_CARE_PROVIDER_SITE_OTHER): Payer: Self-pay | Admitting: Vascular Surgery

## 2022-12-22 DIAGNOSIS — I70219 Atherosclerosis of native arteries of extremities with intermittent claudication, unspecified extremity: Secondary | ICD-10-CM

## 2022-12-23 ENCOUNTER — Ambulatory Visit (INDEPENDENT_AMBULATORY_CARE_PROVIDER_SITE_OTHER): Payer: Medicare HMO | Admitting: Vascular Surgery

## 2022-12-23 ENCOUNTER — Encounter (INDEPENDENT_AMBULATORY_CARE_PROVIDER_SITE_OTHER): Payer: Medicare HMO

## 2023-01-07 ENCOUNTER — Other Ambulatory Visit: Payer: Self-pay | Admitting: Internal Medicine

## 2023-01-21 ENCOUNTER — Other Ambulatory Visit (HOSPITAL_COMMUNITY): Payer: Self-pay

## 2023-01-23 ENCOUNTER — Other Ambulatory Visit: Payer: Self-pay | Admitting: Cardiology

## 2023-01-26 NOTE — Progress Notes (Deleted)
 MRN : [id number]  Stephanie [name] is a 88 y.o. (1935/06/09) female who presents with chief complaint of check circulation.  History of Present Illness:  The patient returns to the office for followup and review of the noninvasive studies.    There have been no interval changes in lower extremity symptoms per the patient. No interval shortening of the patient's claudication distance or development of rest pain symptoms. No new ulcers or wounds have occurred since the last visit.   There have been no significant changes to the patient's overall health care.   The patient denies amaurosis fugax or recent TIA symptoms. There are no documented recent neurological changes noted. There is no history of DVT, PE or superficial thrombophlebitis. The patient denies recent episodes of angina or shortness of breath.    ABI Rt=0.69 and Lt=0.63  (monophasic signals bilaterally) (previous ABI's Rt=1.23 and Lt=1.09)  No outpatient medications have been marked as taking for the 01/27/23 encounter (Appointment) with Jama, Cordella MATSU, MD.    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 Wonda and Dr. Lucas.   Severe aortic stenosis    Symptomatic anemia    Presumed GI bleed January 2021 - she declined GI work-up   Type 2 diabetes mellitus [facility])     Past Surgical History:  Procedure Laterality Date   APPENDECTOMY     BACK SURGERY     BRONCHIAL BIOPSY  03/02/2022   Procedure: BRONCHIAL BIOPSIES;  Surgeon: Brenna Adine LITTIE, DO;  Location: [facility];  Service: Pulmonary;;   BRONCHIAL  BRUSHINGS  03/02/2022   Procedure: BRONCHIAL BRUSHINGS;  Surgeon: Brenna Adine LITTIE, DO;  Location: [facility];  Service: Pulmonary;;   BRONCHIAL NEEDLE ASPIRATION BIOPSY  03/02/2022   Procedure: BRONCHIAL NEEDLE ASPIRATION BIOPSIES;  Surgeon: Brenna Adine LITTIE, DO;  Location: [facility];  Service: Pulmonary;;   CYST REMOVAL HAND     Right, hospital in [place]   FIDUCIAL MARKER PLACEMENT  03/02/2022   Procedure: FIDUCIAL MARKER PLACEMENT;  Surgeon: Brenna Adine LITTIE, DO;  Location: [facility];  Service: Pulmonary;;   INTRAOPERATIVE TRANSTHORACIC ECHOCARDIOGRAM Left 07/01/2020   Procedure: INTRAOPERATIVE TRANSTHORACIC ECHOCARDIOGRAM;  Surgeon: Wonda Sharper, MD;  Location: [facility] OR;  Service: Open Heart Surgery;  Laterality: Left;   KNEE ARTHROSCOPY WITH MEDIAL MENISECTOMY Right 02/28/2012   Procedure: KNEE ARTHROSCOPY WITH MEDIAL MENISECTOMY;  Surgeon: Taft FORBES Minerva, MD;  Location: [facility];  Service: Orthopedics;  Laterality: Right;   REPAIR KNEE LIGAMENT     RIGHT/LEFT HEART CATH AND CORONARY ANGIOGRAPHY N/A 04/10/2020   Procedure: RIGHT/LEFT HEART CATH AND CORONARY ANGIOGRAPHY;  Surgeon: Dann Candyce RAMAN, MD;  Location: [facility] INVASIVE CV LAB;  Service: Cardiovascular;  Laterality: N/A;  TRANSCATHETER AORTIC VALVE REPLACEMENT, TRANSFEMORAL N/A 07/01/2020   Procedure: TRANSCATHETER AORTIC VALVE REPLACEMENT, TRANSFEMORAL;  Surgeon: Wonda Sharper, MD;  Location: [facility] OR;  Service: Open Heart Surgery;  Laterality: N/A;   ULTRASOUND GUIDANCE FOR VASCULAR ACCESS Bilateral 07/01/2020   Procedure: ULTRASOUND GUIDANCE FOR VASCULAR ACCESS;  Surgeon: Wonda Sharper, MD;  Location: [facility] OR;  Service: Open Heart Surgery;  Laterality: Bilateral;    Social History Social History   Tobacco Use   Smoking status: Former    Current packs/day: 1.00    Types: Cigarettes    Passive exposure: Never   Smokeless tobacco: Never   Tobacco comments:    Quit over 40 years ago.  Does not know how many years she smoked  or start/stop date  Vaping Use   Vaping status: Never Used  Substance Use Topics   Alcohol use: No   Drug use: No    Family History Family History  Problem Relation Age of Onset   Lung disease Other    Cancer Other    Arthritis Other    Asthma Other    Diabetes Other    Diabetes Mother    Hypertension Mother    Diverticulitis Mother    Diabetes Father    Hypertension Sister    Diabetes Brother    Colon cancer Neg Hx     Allergies  Allergen Reactions   Amoxicillin Swelling    Tongue swelling    Penicillins Swelling    Has patient had a PCN reaction causing immediate rash, facial/tongue/throat swelling, SOB or lightheadedness with hypotension: No Has patient had a PCN reaction causing severe rash involving mucus membranes or skin necrosis: No Has patient had a PCN reaction that required hospitalization: No Has patient had a PCN reaction occurring within the last 10 years: No If all of the above answers are NO, then may proceed with Cephalosporin use.    Sulfa Antibiotics Swelling    Lip swelling   Nitrofurantoin Nausea And Vomiting     REVIEW OF SYSTEMS (Negative unless checked)  Constitutional: [] Weight loss  [] Fever  [] Chills Cardiac: [] Chest pain   [] Chest pressure   [] Palpitations   [] Shortness of breath when laying flat   [] Shortness of breath with exertion. Vascular:  [x] Pain in legs with walking   [] Pain in legs at rest  [] History of DVT   [] Phlebitis   [] Swelling in legs   [] Varicose veins   [] Non-healing ulcers Pulmonary:   [] Uses home oxygen    [] Productive cough   [] Hemoptysis   [] Wheeze  [] COPD   [] Asthma Neurologic:  [] Dizziness   [] Seizures   [] History of stroke   [] History of TIA  [] Aphasia   [] Vissual changes   [] Weakness or numbness in arm   [] Weakness or numbness in leg Musculoskeletal:   [] Joint swelling   [] Joint pain   [] Low back pain Hematologic:  [] Easy bruising  [] Easy bleeding   [] Hypercoagulable state   [] Anemic Gastrointestinal:   [] Diarrhea   [] Vomiting  [] Gastroesophageal reflux/heartburn   [] Difficulty swallowing. Genitourinary:  [] Chronic kidney disease   [] Difficult urination  [] Frequent urination   [] Blood in urine Skin:  [] Rashes   [] Ulcers  Psychological:  [] History of anxiety   []  History of major depression.  Physical Examination  There were no vitals filed for this visit. There is no height or weight on file to calculate BMI. Gen: WD/WN, NAD Head: [place]/AT, No temporalis wasting.  Ear/Nose/Throat: Hearing grossly intact, nares w/o erythema or drainage Eyes: PER, EOMI, sclera nonicteric.  Neck: Supple,  no masses.  No bruit or JVD.  Pulmonary:  Good air movement, no audible wheezing, no use of accessory muscles.  Cardiac: RRR, normal S1, S2, no Murmurs. Vascular:  mild trophic changes, no open wounds Vessel Right Left  Radial Palpable Palpable  PT Not Palpable Not Palpable  DP Not Palpable Not Palpable  Gastrointestinal: soft, non-distended. No guarding/no peritoneal signs.  Musculoskeletal: M/S 5/5 throughout.  No visible deformity.  Neurologic: CN 2-12 intact. Pain and light touch intact in extremities.  Symmetrical.  Speech is fluent. Motor exam as listed above. Psychiatric: Judgment intact, Mood & affect appropriate for pt's clinical situation. Dermatologic: No rashes or ulcers noted.  No changes consistent with cellulitis.   CBC Lab Results  Component Value Date   WBC 11.2 (H) 09/07/2022   HGB 12.7 09/07/2022   HCT 40.0 09/07/2022   MCV 86.8 09/07/2022   PLT 246 09/07/2022    BMET    Component Value Date/Time   NA 137 09/07/2022 0329   NA 139 07/20/2021 1324   K 4.8 09/07/2022 0329   CL 99 09/07/2022 0329   CO2 29 09/07/2022 0329   GLUCOSE 121 (H) 09/07/2022 0329   BUN 22 09/07/2022 0329   BUN 24 07/20/2021 1324   CREATININE 1.31 (H) 09/07/2022 0329   CALCIUM  10.3 09/07/2022 0329   GFRNONAA 40 (L) 09/07/2022 0329   GFRAA 49 (L) 02/10/2019 0610   CrCl cannot be calculated  (Patient's most recent lab result is older than the maximum 21 days allowed.).  COAG Lab Results  Component Value Date   INR 0.9 06/27/2020   INR 1.00 11/15/2017    Radiology No results found.   Assessment/Plan There are no diagnoses linked to this encounter.   Cordella Shawl, MD  01/26/2023 7:57 AM

## 2023-01-27 ENCOUNTER — Ambulatory Visit (INDEPENDENT_AMBULATORY_CARE_PROVIDER_SITE_OTHER): Payer: 59

## 2023-01-27 ENCOUNTER — Ambulatory Visit (INDEPENDENT_AMBULATORY_CARE_PROVIDER_SITE_OTHER): Payer: Medicare HMO | Admitting: Vascular Surgery

## 2023-01-27 DIAGNOSIS — I70219 Atherosclerosis of native arteries of extremities with intermittent claudication, unspecified extremity: Secondary | ICD-10-CM

## 2023-01-27 DIAGNOSIS — J4530 Mild persistent asthma, uncomplicated: Secondary | ICD-10-CM

## 2023-01-27 DIAGNOSIS — I10 Essential (primary) hypertension: Secondary | ICD-10-CM

## 2023-01-27 DIAGNOSIS — E1165 Type 2 diabetes mellitus with hyperglycemia: Secondary | ICD-10-CM

## 2023-01-27 DIAGNOSIS — E782 Mixed hyperlipidemia: Secondary | ICD-10-CM

## 2023-01-28 LAB — VAS US ABI WITH/WO TBI
Left ABI: 0.67
Right ABI: 0.71

## 2023-02-17 ENCOUNTER — Ambulatory Visit (INDEPENDENT_AMBULATORY_CARE_PROVIDER_SITE_OTHER): Payer: Medicare HMO | Admitting: Vascular Surgery

## 2023-02-17 NOTE — Progress Notes (Unsigned)
MRN : [id number]  Stephanie [name] is a 88 y.o. (Jun 17, 1935) female who presents with chief complaint of check circulation.  History of Present Illness:  The patient returns to the office for followup and review of the noninvasive studies.    There have been no interval changes in lower extremity symptoms per the patient. No interval shortening of the patient's claudication distance or development of rest pain symptoms. No new ulcers or wounds have occurred since the last visit.   There have been no significant changes to the patient's overall health care.   The patient denies amaurosis fugax or recent TIA symptoms. There are no documented recent neurological changes noted. There is no history of DVT, PE or superficial thrombophlebitis. The patient denies recent episodes of angina or shortness of breath.    ABI Rt=0.71 and Lt=0.67  (monophasic signals bilaterally) (previous ABI's Rt=0.69 and Lt=0.63)  No outpatient medications have been marked as taking for the 02/21/23 encounter (Appointment) with Gilda Crease, Latina Craver, MD.    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 Excell Seltzer and Dr. Laneta Simmers.   Severe aortic stenosis    Symptomatic anemia    Presumed GI bleed January 2021 - she declined GI work-up   Type 2 diabetes mellitus [facility])     Past Surgical History:  Procedure Laterality Date   APPENDECTOMY     BACK SURGERY     BRONCHIAL BIOPSY  03/02/2022   Procedure: BRONCHIAL BIOPSIES;  Surgeon: Josephine Igo, DO;  Location: [facility];  Service: Pulmonary;;   BRONCHIAL  BRUSHINGS  03/02/2022   Procedure: BRONCHIAL BRUSHINGS;  Surgeon: Josephine Igo, DO;  Location: [facility];  Service: Pulmonary;;   BRONCHIAL NEEDLE ASPIRATION BIOPSY  03/02/2022   Procedure: BRONCHIAL NEEDLE ASPIRATION BIOPSIES;  Surgeon: Josephine Igo, DO;  Location: [facility];  Service: Pulmonary;;   CYST REMOVAL HAND     Right, hospital in [place]   FIDUCIAL MARKER PLACEMENT  03/02/2022   Procedure: FIDUCIAL MARKER PLACEMENT;  Surgeon: Josephine Igo, DO;  Location: [facility];  Service: Pulmonary;;   INTRAOPERATIVE TRANSTHORACIC ECHOCARDIOGRAM Left 07/01/2020   Procedure: INTRAOPERATIVE TRANSTHORACIC ECHOCARDIOGRAM;  Surgeon: Tonny Bollman, MD;  Location: [facility] OR;  Service: Open Heart Surgery;  Laterality: Left;   KNEE ARTHROSCOPY WITH MEDIAL MENISECTOMY Right 02/28/2012   Procedure: KNEE ARTHROSCOPY WITH MEDIAL MENISECTOMY;  Surgeon: Vickki Hearing, MD;  Location: [facility]S;  Service: Orthopedics;  Laterality: Right;   REPAIR KNEE LIGAMENT     RIGHT/LEFT HEART CATH AND CORONARY ANGIOGRAPHY N/A 04/10/2020   Procedure: RIGHT/LEFT HEART CATH AND CORONARY ANGIOGRAPHY;  Surgeon: Corky Crafts, MD;  Location: [facility] INVASIVE CV LAB;  Service: Cardiovascular;  Laterality: N/A;  TRANSCATHETER AORTIC VALVE REPLACEMENT, TRANSFEMORAL N/A 07/01/2020   Procedure: TRANSCATHETER AORTIC VALVE REPLACEMENT, TRANSFEMORAL;  Surgeon: Tonny Bollman, MD;  Location: [facility] OR;  Service: Open Heart Surgery;  Laterality: N/A;   ULTRASOUND GUIDANCE FOR VASCULAR ACCESS Bilateral 07/01/2020   Procedure: ULTRASOUND GUIDANCE FOR VASCULAR ACCESS;  Surgeon: Tonny Bollman, MD;  Location: [facility] OR;  Service: Open Heart Surgery;  Laterality: Bilateral;    Social History Social History   Tobacco Use   Smoking status: Former    Current packs/day: 1.00    Types: Cigarettes    Passive exposure: Never   Smokeless tobacco: Never   Tobacco comments:    Quit over 40 years ago.  Does not know how many years she smoked  or start/stop date  Vaping Use   Vaping status: Never Used  Substance Use Topics   Alcohol use: No   Drug use: No    Family History Family History  Problem Relation Age of Onset   Lung disease Other    Cancer Other    Arthritis Other    Asthma Other    Diabetes Other    Diabetes Mother    Hypertension Mother    Diverticulitis Mother    Diabetes Father    Hypertension Sister    Diabetes Brother    Colon cancer Neg Hx     Allergies  Allergen Reactions   Amoxicillin Swelling    Tongue swelling    Penicillins Swelling    Has patient had a PCN reaction causing immediate rash, facial/tongue/throat swelling, SOB or lightheadedness with hypotension: No Has patient had a PCN reaction causing severe rash involving mucus membranes or skin necrosis: No Has patient had a PCN reaction that required hospitalization: No Has patient had a PCN reaction occurring within the last 10 years: No If all of the above answers are "NO", then may proceed with Cephalosporin use.    Sulfa Antibiotics Swelling    Lip swelling   Nitrofurantoin Nausea And Vomiting     REVIEW OF SYSTEMS (Negative unless checked)  Constitutional: [] Weight loss  [] Fever  [] Chills Cardiac: [] Chest pain   [] Chest pressure   [] Palpitations   [] Shortness of breath when laying flat   [x] Shortness of breath with exertion. Vascular:  [x] Pain in legs with walking   [] Pain in legs at rest  [] History of DVT   [] Phlebitis   [] Swelling in legs   [] Varicose veins   [] Non-healing ulcers Pulmonary:   [] Uses home oxygen   [] Productive cough   [] Hemoptysis   [] Wheeze  [] COPD   [x] Asthma Neurologic:  [] Dizziness   [] Seizures   [] History of stroke   [] History of TIA  [] Aphasia   [] Vissual changes   [] Weakness or numbness in arm   [] Weakness or numbness in leg Musculoskeletal:   [] Joint swelling   [x] Joint pain   [] Low back pain Hematologic:  [] Easy bruising  [] Easy bleeding   [] Hypercoagulable state   [] Anemic Gastrointestinal:   [] Diarrhea   [] Vomiting  [] Gastroesophageal reflux/heartburn   [] Difficulty swallowing. Genitourinary:  [] Chronic kidney disease   [] Difficult urination  [] Frequent urination   [] Blood in urine Skin:  [] Rashes   [] Ulcers  Psychological:  [] History of anxiety   []  History of major depression.  Physical Examination  There were no vitals filed for this visit. There is no height or weight on file to calculate BMI. Gen: WD/WN, NAD Head: [place]/AT, No temporalis wasting.  Ear/Nose/Throat: Hearing grossly intact, nares w/o erythema or drainage Eyes: PER, EOMI, sclera nonicteric.  Neck: Supple,  no masses.  No bruit or JVD.  Pulmonary:  Good air movement, no audible wheezing, no use of accessory muscles.  Cardiac: RRR, normal S1, S2, no Murmurs. Vascular:  mild trophic changes, no open wounds Vessel Right Left  Radial Palpable Palpable  PT Not Palpable Not Palpable  DP Not Palpable Not Palpable  Gastrointestinal: soft, non-distended. No guarding/no peritoneal signs.  Musculoskeletal: M/S 5/5 throughout.  No visible deformity.  Neurologic: CN 2-12 intact. Pain and light touch intact in extremities.  Symmetrical.  Speech is fluent. Motor exam as listed above. Psychiatric: Judgment intact, Mood & affect appropriate for pt's clinical situation. Dermatologic: No rashes or ulcers noted.  No changes consistent with cellulitis.   CBC Lab Results  Component Value Date   WBC 11.2 (H) 09/07/2022   HGB 12.7 09/07/2022   HCT 40.0 09/07/2022   MCV 86.8 09/07/2022   PLT 246 09/07/2022    BMET    Component Value Date/Time   NA 137 09/07/2022 0329   NA 139 07/20/2021 1324   K 4.8 09/07/2022 0329   CL 99 09/07/2022 0329   CO2 29 09/07/2022 0329   GLUCOSE 121 (H) 09/07/2022 0329   BUN 22 09/07/2022 0329   BUN 24 07/20/2021 1324   CREATININE 1.31 (H) 09/07/2022 0329   CALCIUM 10.3 09/07/2022 0329   GFRNONAA 40 (L) 09/07/2022 0329   GFRAA 49 (L) 02/10/2019 0610   CrCl cannot be calculated  (Patient's most recent lab result is older than the maximum 21 days allowed.).  COAG Lab Results  Component Value Date   INR 0.9 06/27/2020   INR 1.00 11/15/2017    Radiology VAS [place] ABI WITH/WO TBI Result Date: 01/28/2023  LOWER EXTREMITY DOPPLER STUDY Patient Name:  WALTER MIN  Date of Exam:   01/27/2023 Medical Rec #: [id number]          Accession #:    [id number] Date of Birth: 1935-04-24          Patient Gender: F Patient Age:   68 years Exam Location:  [facility] Procedure:      VAS [place] ABI WITH/WO TBI Referring Phys: Levora Dredge --------------------------------------------------------------------------------  Indications: Peripheral artery disease. High Risk Factors: Hypertension, hyperlipidemia.  Comparison Study: 09/16/2022 Performing Technologist: Debbe Bales RVS  Examination Guidelines: A complete evaluation includes at minimum, Doppler waveform signals and systolic blood pressure reading at the level of bilateral brachial, anterior tibial, and posterior tibial arteries, when vessel segments are accessible. Bilateral testing is considered an integral part of a complete examination. Photoelectric Plethysmograph (PPG) waveforms and toe systolic pressure readings are included as required and additional duplex testing as needed. Limited examinations for reoccurring indications may be performed as noted.  ABI Findings: +---------+------------------+-----+----------+--------+ Right    Rt Pressure (mmHg)IndexWaveform  Comment  +---------+------------------+-----+----------+--------+ Brachial 208                                       +---------+------------------+-----+----------+--------+ ATA      148               0.71 biphasic           +---------+------------------+-----+----------+--------+ PTA      117               0.56 monophasic         +---------+------------------+-----+----------+--------+ Helayne [place]  0.42 Abnormal            +---------+------------------+-----+----------+--------+ +---------+------------------+-----+----------+-------+ Left     Lt Pressure (mmHg)IndexWaveform  Comment +---------+------------------+-----+----------+-------+ Brachial 200                                      +---------+------------------+-----+----------+-------+ ATA      94                0.45 monophasic        +---------+------------------+-----+----------+-------+ PTA      140               0.67 monophasic        +---------+------------------+-----+----------+-------+ Great Toe86                0.41 Abnormal          +---------+------------------+-----+----------+-------+ +-------+-----------+-----------+------------+------------+ ABI/TBIToday's ABIToday's TBIPrevious ABIPrevious TBI +-------+-----------+-----------+------------+------------+ Right  .71        .42        .76         .50          +-------+-----------+-----------+------------+------------+ Left   .67        .41        .58         00           +-------+-----------+-----------+------------+------------+  Left ABIs and TBIs appear increased compared to prior study on 09/16/2022. Right ABIs appear essentially unchanged compared to prior study on 09/16/2022. Right TBIs appear to be decreased compared to prior study on 09/16/2022.  Summary: Right: Resting right ankle-brachial index indicates moderate right lower extremity arterial disease. The right toe-brachial index is abnormal. Left: Resting left ankle-brachial index indicates moderate left lower extremity arterial disease. The left toe-brachial index is abnormal. *See table(s) above for measurements and observations.  Electronically signed by Levora Dredge MD on 01/28/2023 at 8:40:01 AM.    Final      Assessment/Plan 1. 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  The patient does not voice lifestyle limiting changes at this point in time.  Noninvasive studies do not suggest clinically significant change.  No invasive studies, angiography or surgery at this time The patient should continue walking and begin a more formal exercise program.  The patient should continue antiplatelet therapy and aggressive treatment of the lipid abnormalities  No changes in the patient's medications at this time  Continued surveillance is indicated as atherosclerosis is likely to progress with time.    The patient will continue follow up with noninvasive studies as ordered.  - VAS [place] ABI WITH/WO TBI; Future  2. Essential hypertension, benign Continue antihypertensive medications as already ordered, these medications have been reviewed and there are no changes at this time.  3. Mild persistent asthma, unspecified whether complicated Continue pulmonary medications and aerosols as already ordered, these medications have been reviewed and there are no changes at this time.   4. Mixed hyperlipidemia Continue statin as ordered and reviewed, no changes at this time    Levora Dredge, MD  02/17/2023 1:04 PM

## 2023-02-21 ENCOUNTER — Ambulatory Visit (INDEPENDENT_AMBULATORY_CARE_PROVIDER_SITE_OTHER): Payer: 59 | Admitting: Vascular Surgery

## 2023-02-21 ENCOUNTER — Encounter (INDEPENDENT_AMBULATORY_CARE_PROVIDER_SITE_OTHER): Payer: Self-pay | Admitting: Vascular Surgery

## 2023-02-21 VITALS — BP 145/70 | HR 52 | Resp 16

## 2023-02-21 DIAGNOSIS — E782 Mixed hyperlipidemia: Secondary | ICD-10-CM

## 2023-02-21 DIAGNOSIS — I10 Essential (primary) hypertension: Secondary | ICD-10-CM | POA: Diagnosis not present

## 2023-02-21 DIAGNOSIS — J4530 Mild persistent asthma, uncomplicated: Secondary | ICD-10-CM | POA: Diagnosis not present

## 2023-02-21 DIAGNOSIS — I70219 Atherosclerosis of native arteries of extremities with intermittent claudication, unspecified extremity: Secondary | ICD-10-CM

## 2023-02-22 ENCOUNTER — Encounter (INDEPENDENT_AMBULATORY_CARE_PROVIDER_SITE_OTHER): Payer: Self-pay | Admitting: Vascular Surgery

## 2023-03-30 NOTE — Progress Notes (Deleted)
 Stephanie [name], female    DOB: 01-23-1935   MRN: [id number]   Brief patient profile:  40 yobf  quit smoking 2000 with doe then  referred to pulmonary clinic in [place]  03/31/2021 by Philomena Course for spn     History of Present Illness  03/31/2021  Pulmonary/ 1st office eval/ [name] / [place] Office  Chief Complaint  Patient presents with   Consult    CT done in Feb 2023 showed pulmonary nodules.   Dyspnea:  can still do [employer] but usually uses scooter due to back  Also walks [place] pace s doe  Cough: none  Sleep: 2 pillows bed is flat SABA use: neb not even once a month  Rec We will schedule you for PFTs > not done as of 11/13/2021  also contact you in the future for CT @ 1 years from the last.  Call sooner for worse breathing or chest pain when you breathe in or cough that won't go away after a few weeks.   11/13/2021  f/u ov/[place] office/Stephanie [name] re: Nodule f/u maint on no resp rx   Chief Complaint  Patient presents with   Follow-up    Lung nodule seen on CT   Dyspnea: rides scooter for years due to back / walks next door to sister with rollator  Cough: none  Sleeping: 2 pillows/ flat bed  SABA use: has neb, not using  02: none  Covid status: vax / never infected  Rec My office will be contacting you by phone for referral to [facility] office for PFTs and same day visit with Dr Regenia Skeeter or [place]  > dx PET pos RLL SPN> SBRT planned       04/07/2022  f/u ov/Stephanie [name] re: GOLD 3 copd  maint on no rx  / SBRT starting 04/08/22  Chief Complaint  Patient presents with   Follow-up    Lung nodule.  Starting chemotherapy tomorrow at [facility].  Patient states wheezing last week.  Seen at [facility]. Does not know rx given.  Dyspnea:  balance is limiting / walks with rollator to sisters house next door  Cough: none  Sleeping: bed is flat 2 pillows or loses breath SABA use: neb prn bad attacks  02: none  Rec Albuterol 2.5 mg with budesonide 0.25 mg twice  daily in nebulizer  routinely (automatically)  and up to every 4 hours if needed Please schedule a follow up visit in 3 months but call sooner if needed in [place] office     09/30/2022  f/u ov/[place] office/Stephanie [name] re: GOLD 3 copd  maint on alb/bud bid and rarely  needs prn saba  Chief Complaint  Patient presents with   Follow-up    Follow up , pt was not able to do bx b/c her heart rate was dropping at the time of producer   Dyspnea:  rarely limited but very sedentary  Cough: none  Sleeping: flat bed 2 pillows s  resp cc  SABA use: rarely  02: none   Rec No change in medications  Please schedule a follow up visit in 6  months but call sooner if needed    03/31/2023  f/u ov/[place] office/Stephanie [name] re: GOLD 3 copd  maint on ***  No chief complaint on file.   Dyspnea:  *** Cough: *** Sleeping: ***   resp cc  SABA use: *** 02: ***  Lung cancer screening: ***   No obvious day to day or daytime variability  or assoc excess/ purulent sputum or mucus plugs or hemoptysis or cp or chest tightness, subjective wheeze or overt sinus or hb symptoms.    Also denies any obvious fluctuation of symptoms with weather or environmental changes or other aggravating or alleviating factors except as outlined above   No unusual exposure hx or h/o childhood pna/ asthma or knowledge of premature birth.  Current Allergies, Complete Past Medical History, Past Surgical History, Family History, and Social History were reviewed in [employer] record.  ROS  The following are not active complaints unless bolded Hoarseness, sore throat, dysphagia, dental problems, itching, sneezing,  nasal congestion or discharge of excess mucus or purulent secretions, ear ache,   fever, chills, sweats, unintended wt loss or wt gain, classically pleuritic or exertional cp,  orthopnea pnd or arm/hand swelling  or leg swelling, presyncope, palpitations, abdominal pain, anorexia, nausea, vomiting,  diarrhea  or change in bowel habits or change in bladder habits, change in stools or change in urine, dysuria, hematuria,  rash, arthralgias, visual complaints, headache, numbness, weakness or ataxia or problems with walking or coordination,  change in mood or  memory.        No outpatient medications have been marked as taking for the 03/31/23 encounter (Appointment) with Nyoka Cowden, MD.           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 Excell Seltzer and Dr. Laneta Simmers.   Severe aortic stenosis    Symptomatic anemia    Presumed GI bleed January 2021 - she declined GI work-up   Type 2 diabetes mellitus (HCC)         Objective:    Wts  03/31/2023       ***  09/30/2022       225   04/07/2022       189  11/13/21 181 lb 3.2 oz (82.2 kg)  10/14/21 183 lb 6.4 oz (83.2 kg)  10/13/21 225 lb (102.1 kg)      Vital signs reviewed  03/31/2023  - Note at rest 02 sats  ***% on ***   General appearance:    ***    Min barr   2-3 /6 SEM ***               Assessment

## 2023-03-31 ENCOUNTER — Ambulatory Visit: Payer: Medicare HMO | Admitting: Internal Medicine

## 2023-04-05 IMAGING — MR MR HEAD WO/W CM
13 of 15 series · 30 of 48 positions shown · IV contrast (gadavist)
Comparison: None.

CLINICAL DATA: Lung nodule.  Possible renal mass.

EXAM:
MRI HEAD WITHOUT AND WITH CONTRAST
TECHNIQUE: Multiplanar, multiecho pulse sequences of the brain and surrounding
structures were obtained without and with intravenous contrast.
CONTRAST:  10mL GADAVIST GADOBUTROL 1 MMOL/ML IV SOLN

[Series 5: DWI · axial · 4.0mm · 0.88mm/px · z∈[-48,+91]mm · 3 of 36 slices shown (1 of 4)]
[im 1/36]
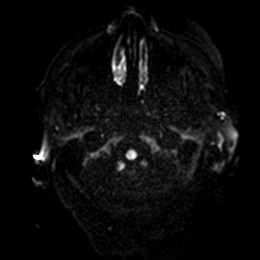
[im 18/36]
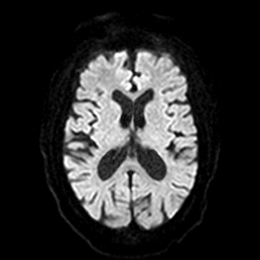
[im 36/36]
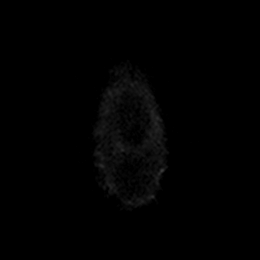

[Series 6: DWI · axial · 4.0mm · 0.88mm/px · z∈[-48,+91]mm · 3 of 36 slices shown (2 of 4)]
[im 1/36]
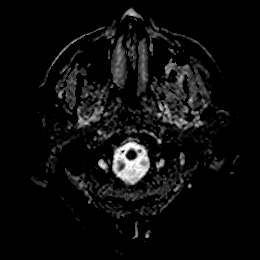
[im 18/36]
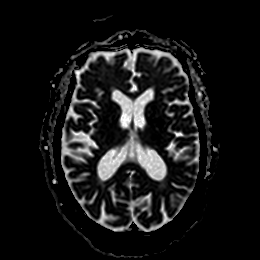
[im 36/36]
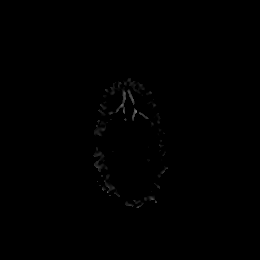

[Series 7: DWI · coronal · 5.0mm · 0.88mm/px · 3 of 32 slices shown (3 of 4)]
[im 1/32]
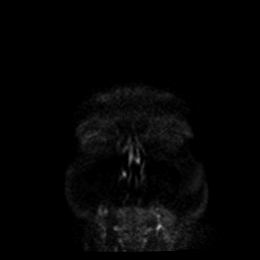
[im 16/32]
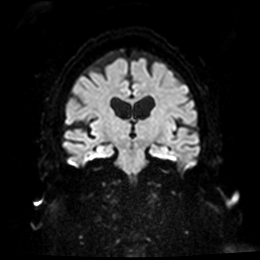
[im 32/32]
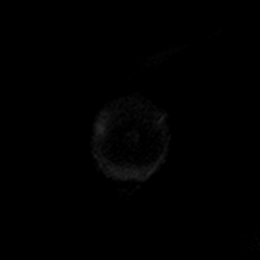

[Series 8: DWI · coronal · 5.0mm · 0.88mm/px · 2 of 32 slices shown (4 of 4)]
[im 1/32]
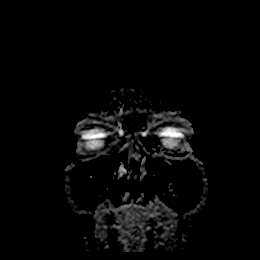
[im 32/32]
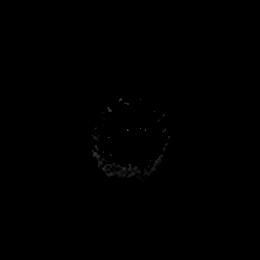

[Series 9: T1 · sagittal · 5.0mm · 0.75mm/px · 1 of 21 slices shown]
[im 1/21]
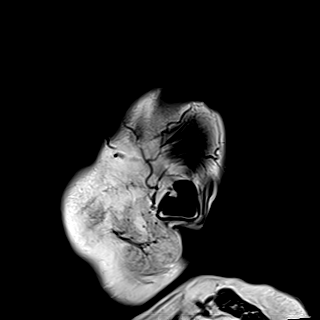

[Series 10: T2 · axial · 5.0mm · 0.72mm/px · 1 of 22 slices shown]
[im 1/22]
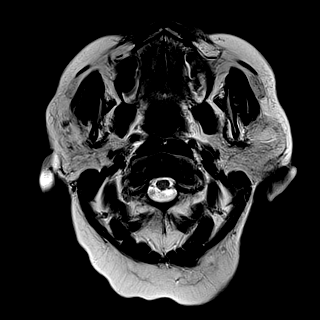

[Series 11: mag_images · axial · 3.0mm · 0.90mm/px · z∈[-59,+93]mm · 3 of 52 slices shown]
[im 1/52]
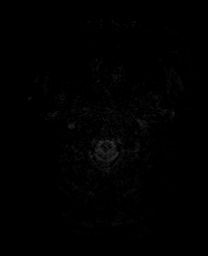
[im 26/52]
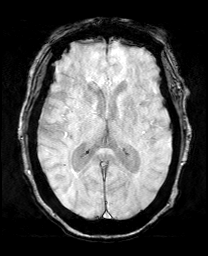
[im 52/52]
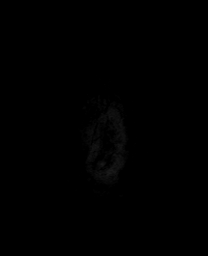

[Series 12: pha_images · axial · 3.0mm · 0.90mm/px · z∈[-59,+93]mm · 3 of 52 slices shown]
[im 1/52]
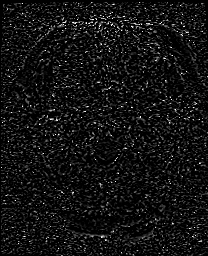
[im 26/52]
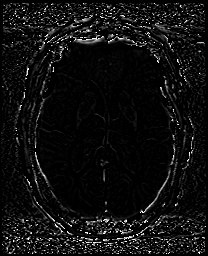
[im 52/52]
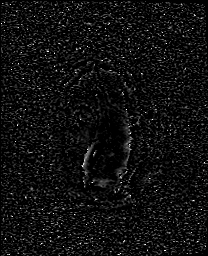

[Series 13: swi_images · axial · 3.0mm · 0.90mm/px · z∈[-59,+93]mm · 3 of 52 slices shown]
[im 1/52]
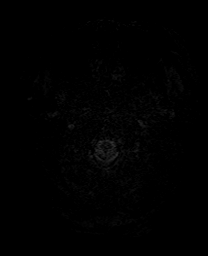
[im 26/52]
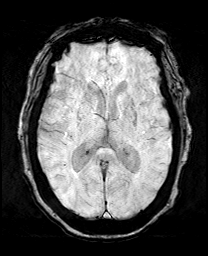
[im 52/52]
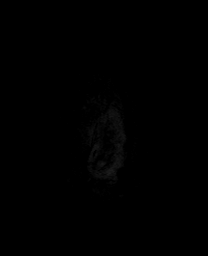

[Series 15: FLAIR · axial · 3.0mm · 0.45mm/px · z∈[-55,+85]mm · 3 of 48 slices shown]
[im 1/48]
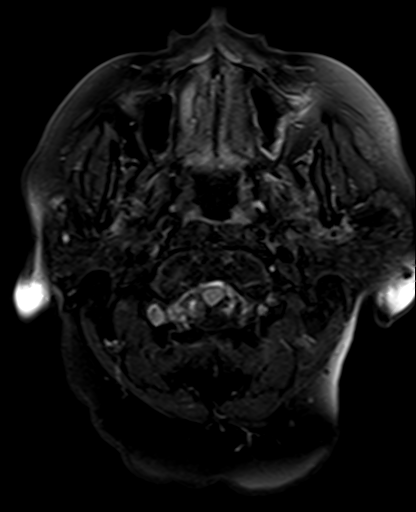
[im 24/48]
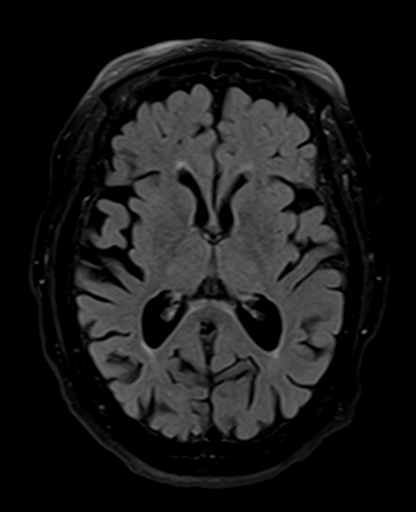
[im 48/48]
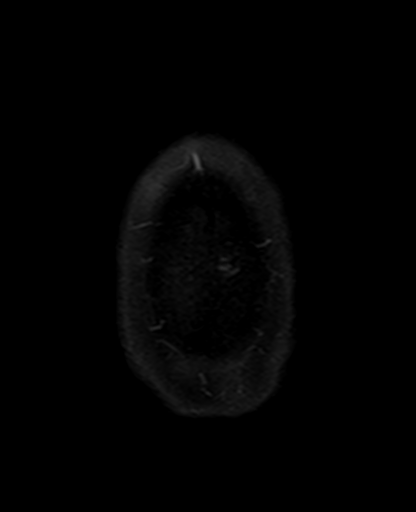

[Series 17: T2 post-contrast · coronal · 5.0mm · 0.72mm/px · 2 of 32 slices shown]
[im 1/32]
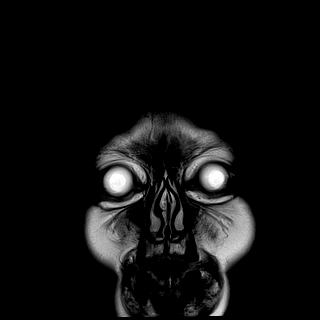
[im 32/32]
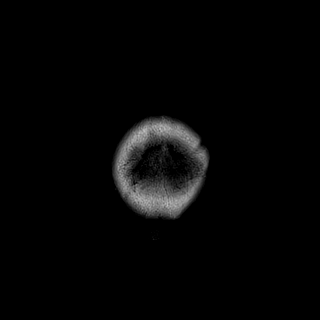

[Series 19: T1 post-contrast · coronal · 5.0mm · 0.34mm/px · 2 of 28 slices shown (1 of 2)]
[im 1/28]
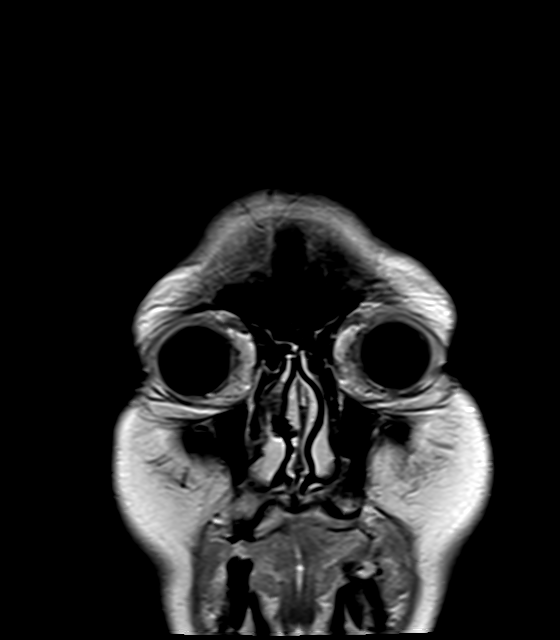
[im 28/28]
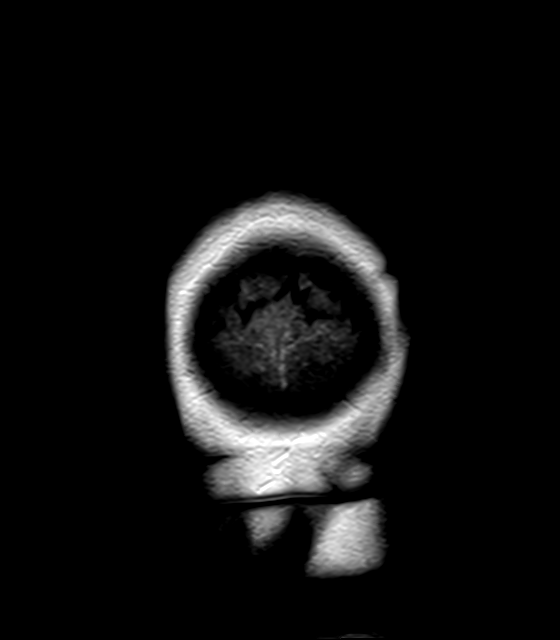

[Series 20: T1 post-contrast · sagittal · 5.0mm · 0.75mm/px · 1 of 21 slices shown (2 of 2)]
[im 1/21]
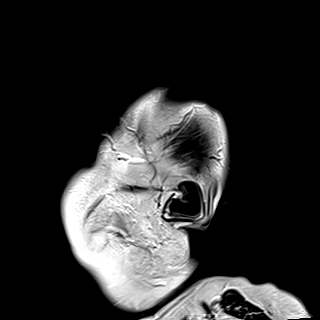

[30 of 48 positions shown; findings below may reference images not displayed]

FINDINGS: Brain: Mild atrophy and white matter changes are present
bilaterally, likely within normal limits for age. The ventricles are
proportionate to the degree of atrophy.

No acute infarct, hemorrhage, or mass lesion is present.

The internal auditory canals are within normal limits. The brainstem
and cerebellum are within normal limits.

Postcontrast images demonstrate no pathologic enhancement.

Vascular: Flow is present in the major intracranial arteries.

Skull and upper cervical spine: The craniocervical junction is
normal. Upper cervical spine is within normal limits. Marrow signal
is unremarkable.

Sinuses/Orbits: Left sphenoid sinus is opacified. Minimal mucosal
thickening is present in the ethmoid air cells and inferior left
maxillary sinus.
IMPRESSION: 1. No evidence for metastatic disease to the brain or meninges.
2. Normal MRI appearance of the brain for age.
3. Left sphenoid sinus disease.

## 2023-04-07 ENCOUNTER — Encounter (HOSPITAL_COMMUNITY): Payer: Self-pay | Admitting: Emergency Medicine

## 2023-04-07 ENCOUNTER — Other Ambulatory Visit: Payer: Self-pay

## 2023-04-07 ENCOUNTER — Emergency Department (HOSPITAL_COMMUNITY)

## 2023-04-07 ENCOUNTER — Observation Stay (HOSPITAL_COMMUNITY)
Admission: EM | Admit: 2023-04-07 | Discharge: 2023-04-08 | Disposition: A | Discharge status: Home / Self Care | Attending: Emergency Medicine | Admitting: Emergency Medicine

## 2023-04-07 DIAGNOSIS — N179 Acute kidney failure, unspecified: Secondary | ICD-10-CM | POA: Diagnosis not present

## 2023-04-07 DIAGNOSIS — Z952 Presence of prosthetic heart valve: Secondary | ICD-10-CM

## 2023-04-07 DIAGNOSIS — E1169 Type 2 diabetes mellitus with other specified complication: Secondary | ICD-10-CM

## 2023-04-07 DIAGNOSIS — I129 Hypertensive chronic kidney disease with stage 1 through stage 4 chronic kidney disease, or unspecified chronic kidney disease: Secondary | ICD-10-CM | POA: Diagnosis not present

## 2023-04-07 DIAGNOSIS — Z7951 Long term (current) use of inhaled steroids: Secondary | ICD-10-CM | POA: Diagnosis not present

## 2023-04-07 DIAGNOSIS — I10 Essential (primary) hypertension: Secondary | ICD-10-CM | POA: Diagnosis present

## 2023-04-07 DIAGNOSIS — J449 Chronic obstructive pulmonary disease, unspecified: Secondary | ICD-10-CM | POA: Diagnosis not present

## 2023-04-07 DIAGNOSIS — R109 Unspecified abdominal pain: Secondary | ICD-10-CM | POA: Diagnosis present

## 2023-04-07 DIAGNOSIS — Z7984 Long term (current) use of oral hypoglycemic drugs: Secondary | ICD-10-CM | POA: Insufficient documentation

## 2023-04-07 DIAGNOSIS — Z87891 Personal history of nicotine dependence: Secondary | ICD-10-CM | POA: Diagnosis not present

## 2023-04-07 DIAGNOSIS — E1122 Type 2 diabetes mellitus with diabetic chronic kidney disease: Secondary | ICD-10-CM | POA: Diagnosis not present

## 2023-04-07 DIAGNOSIS — Z79899 Other long term (current) drug therapy: Secondary | ICD-10-CM | POA: Diagnosis not present

## 2023-04-07 DIAGNOSIS — Z794 Long term (current) use of insulin: Secondary | ICD-10-CM | POA: Diagnosis not present

## 2023-04-07 DIAGNOSIS — E119 Type 2 diabetes mellitus without complications: Secondary | ICD-10-CM

## 2023-04-07 DIAGNOSIS — J45909 Unspecified asthma, uncomplicated: Secondary | ICD-10-CM | POA: Insufficient documentation

## 2023-04-07 DIAGNOSIS — N1832 Chronic kidney disease, stage 3b: Secondary | ICD-10-CM | POA: Diagnosis not present

## 2023-04-07 DIAGNOSIS — N184 Chronic kidney disease, stage 4 (severe): Secondary | ICD-10-CM | POA: Diagnosis present

## 2023-04-07 DIAGNOSIS — E782 Mixed hyperlipidemia: Secondary | ICD-10-CM | POA: Insufficient documentation

## 2023-04-07 LAB — CBC WITH DIFFERENTIAL/PLATELET
Abs Immature Granulocytes: 0.02 10*3/uL (ref 0.00–0.07)
Basophils Absolute: 0 10*3/uL (ref 0.0–0.1)
Basophils Relative: 0 %
Eosinophils Absolute: 0.2 10*3/uL (ref 0.0–0.5)
Eosinophils Relative: 2 %
HCT: 34.1 % — ABNORMAL LOW (ref 36.0–46.0)
Hemoglobin: 10.6 g/dL — ABNORMAL LOW (ref 12.0–15.0)
Immature Granulocytes: 0 %
Lymphocytes Relative: 11 %
Lymphs Abs: 1.1 10*3/uL (ref 0.7–4.0)
MCH: 28.5 pg (ref 26.0–34.0)
MCHC: 31.1 g/dL (ref 30.0–36.0)
MCV: 91.7 fL (ref 80.0–100.0)
Monocytes Absolute: 1.1 10*3/uL — ABNORMAL HIGH (ref 0.1–1.0)
Monocytes Relative: 12 %
Neutro Abs: 7 10*3/uL (ref 1.7–7.7)
Neutrophils Relative %: 75 %
Platelets: 206 10*3/uL (ref 150–400)
RBC: 3.72 MIL/uL — ABNORMAL LOW (ref 3.87–5.11)
RDW: 14.6 % (ref 11.5–15.5)
WBC: 9.4 10*3/uL (ref 4.0–10.5)
nRBC: 0 % (ref 0.0–0.2)

## 2023-04-07 LAB — COMPREHENSIVE METABOLIC PANEL
ALT: 9 U/L (ref 0–44)
AST: 12 U/L — ABNORMAL LOW (ref 15–41)
Albumin: 3.3 g/dL — ABNORMAL LOW (ref 3.5–5.0)
Alkaline Phosphatase: 91 U/L (ref 38–126)
Anion gap: 12 (ref 5–15)
BUN: 39 mg/dL — ABNORMAL HIGH (ref 8–23)
CO2: 28 mmol/L (ref 22–32)
Calcium: 8 mg/dL — ABNORMAL LOW (ref 8.9–10.3)
Chloride: 99 mmol/L (ref 98–111)
Creatinine, Ser: 2.67 mg/dL — ABNORMAL HIGH (ref 0.44–1.00)
GFR, Estimated: 17 mL/min — ABNORMAL LOW (ref >60)
Glucose, Bld: 131 mg/dL — ABNORMAL HIGH (ref 70–99)
Potassium: 4.5 mmol/L (ref 3.5–5.1)
Sodium: 139 mmol/L (ref 135–145)
Total Bilirubin: 0.7 mg/dL (ref 0.0–1.2)
Total Protein: 6.9 g/dL (ref 6.5–8.1)

## 2023-04-07 LAB — URINALYSIS, ROUTINE W REFLEX MICROSCOPIC
Bilirubin Urine: NEGATIVE
Glucose, UA: NEGATIVE mg/dL
Hgb urine dipstick: NEGATIVE
Ketones, ur: NEGATIVE mg/dL
Nitrite: NEGATIVE
Protein, ur: NEGATIVE mg/dL
Specific Gravity, Urine: 1.017 (ref 1.005–1.030)
pH: 5 (ref 5.0–8.0)

## 2023-04-07 LAB — CBG MONITORING, ED
Glucose-Capillary: 107 mg/dL — ABNORMAL HIGH (ref 70–99)
Glucose-Capillary: 170 mg/dL — ABNORMAL HIGH (ref 70–99)

## 2023-04-07 MED ORDER — ALBUTEROL SULFATE (2.5 MG/3ML) 0.083% IN NEBU
2.5000 mg | INHALATION_SOLUTION | RESPIRATORY_TRACT | Status: DC | PRN
  Administered 2023-04-08: 2.5 mg via RESPIRATORY_TRACT
  Filled 2023-04-07: qty 3

## 2023-04-07 MED ORDER — POLYETHYLENE GLYCOL 3350 17 G PO PACK: 17.0000 g | PACK | Freq: Every day | ORAL | Status: DC | PRN

## 2023-04-07 MED ORDER — ONDANSETRON HCL 4 MG/2ML IJ SOLN: 4.0000 mg | Freq: Four times a day (QID) | INTRAMUSCULAR | Status: DC | PRN

## 2023-04-07 MED ORDER — HEPARIN SODIUM (PORCINE) 5000 UNIT/ML IJ SOLN
5000.0000 [IU] | Freq: Three times a day (TID) | INTRAMUSCULAR | Status: DC
  Administered 2023-04-07 – 2023-04-08 (×2): 5000 [IU] via SUBCUTANEOUS
  Filled 2023-04-07 (×2): qty 1

## 2023-04-07 MED ORDER — ACETAMINOPHEN 325 MG PO TABS: 650.0000 mg | ORAL_TABLET | Freq: Four times a day (QID) | ORAL | Status: DC | PRN

## 2023-04-07 MED ORDER — CARVEDILOL 3.125 MG PO TABS
6.2500 mg | ORAL_TABLET | Freq: Two times a day (BID) | ORAL | Status: DC
  Administered 2023-04-07 – 2023-04-08 (×2): 6.25 mg via ORAL
  Filled 2023-04-07 (×2): qty 2

## 2023-04-07 MED ORDER — INSULIN ASPART 100 UNIT/ML IJ SOLN: 0.0000 [IU] | Freq: Every day | INTRAMUSCULAR | Status: DC

## 2023-04-07 MED ORDER — INSULIN ASPART 100 UNIT/ML IJ SOLN: 0.0000 [IU] | Freq: Three times a day (TID) | INTRAMUSCULAR | Status: DC

## 2023-04-07 MED ORDER — LACTATED RINGERS IV BOLUS
1000.0000 mL | Freq: Once | INTRAVENOUS | Status: AC
  Administered 2023-04-07: 1000 mL via INTRAVENOUS

## 2023-04-07 MED ORDER — HYDROCODONE-ACETAMINOPHEN 5-325 MG PO TABS: 1.0000 | ORAL_TABLET | Freq: Four times a day (QID) | ORAL | Status: DC | PRN

## 2023-04-07 MED ORDER — ACETAMINOPHEN 650 MG RE SUPP: 650.0000 mg | Freq: Four times a day (QID) | RECTAL | Status: DC | PRN

## 2023-04-07 MED ORDER — HYDROCODONE-ACETAMINOPHEN 5-325 MG PO TABS
1.0000 | ORAL_TABLET | Freq: Once | ORAL | Status: AC
  Administered 2023-04-07: 1 via ORAL
  Filled 2023-04-07: qty 1

## 2023-04-07 MED ORDER — LACTATED RINGERS IV SOLN: INTRAVENOUS | Status: DC

## 2023-04-07 MED ORDER — ONDANSETRON HCL 4 MG PO TABS: 4.0000 mg | ORAL_TABLET | Freq: Four times a day (QID) | ORAL | Status: DC | PRN

## 2023-04-07 NOTE — ED Notes (Signed)
 Pt given [place] sandwich and sugar free soda.

## 2023-04-07 NOTE — Assessment & Plan Note (Addendum)
-   Hgba1c - SSI- S - Hold home Levemir, she cannot confirm dose she takes, hold metformin

## 2023-04-07 NOTE — Assessment & Plan Note (Addendum)
 Stable.  She is not exactly sure of the medications she takes. -Resume carvedilol 6.25 -Med reconciliation resume home medications.

## 2023-04-07 NOTE — Assessment & Plan Note (Signed)
 AKI on CKD stage IIIb.  Creatinine 2.67, from baseline 1.3-1.5.  She reports good oral intake, denies GI losses, med list shows Lasix 40 mg daily, she cannot confirm if she takes this medication.  ACE inhibitor also on med list. -1 L bolus given, continue LR 100cc/hr x 15hrs -Hold Lasix, lisinopril

## 2023-04-07 NOTE — H&P (Signed)
 History and Physical    TKAI LARGE ZOX:[id number] DOB: 1935-10-13 DOA: 04/07/2023  PCP: [facility]   Patient coming from: Home  I have personally briefly reviewed patient's old medical records in [facility]  Chief Complaint: Right sided abdominal pain  HPI: MERYEM HAERTEL is a 88 y.o. female with medical history significant for COPD, hypertension, diabetes mellitus, TAVR. Patient presented to the ED with complaints of right flank pain of about 2 days duration.  Pain is persistent.  She denies urinary symptoms.  She was treated for UTI about a month ago.  She reports okay oral intake, no vomiting no loose stools.  No dizziness.  She is unsure if she is on Lasix that is listed on her medication list.  ED Course: Tmax 99.5.  Heart rate 62-78.  Blood pressure systolic 129-135.  O2 sats 91 to 100% on room air.  Creatinine elevated 2.67.  UA with trace leukocytes rare bacteria. CT abdomen and pelvis shows diverticulosis without diverticulitis, no acute abnormalities. 1 Liter bolus given. Hospitalist to admit for AKI.  Review of Systems: As per HPI all other systems reviewed and negative.  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 Excell Seltzer and Dr. Laneta Simmers.   Severe aortic stenosis    Symptomatic anemia    Presumed GI bleed January 2021 - she declined GI work-up   Type 2 diabetes mellitus [facility])     Past Surgical History:  Procedure Laterality Date   APPENDECTOMY     BACK SURGERY     BRONCHIAL BIOPSY  03/02/2022   Procedure: BRONCHIAL BIOPSIES;  Surgeon: Josephine Igo, DO;  Location: [facility] ENDOSCOPY;   Service: Pulmonary;;   BRONCHIAL BRUSHINGS  03/02/2022   Procedure: BRONCHIAL BRUSHINGS;  Surgeon: Josephine Igo, DO;  Location: [facility]OSCOPY;  Service: Pulmonary;;   BRONCHIAL NEEDLE ASPIRATION BIOPSY  03/02/2022   Procedure: BRONCHIAL NEEDLE ASPIRATION BIOPSIES;  Surgeon: Josephine Igo, DO;  Location: [facility] ENDOSCOPY;  Service: Pulmonary;;   CYST REMOVAL HAND     Right, hospital in [place]   FIDUCIAL MARKER PLACEMENT  03/02/2022   Procedure: FIDUCIAL MARKER PLACEMENT;  Surgeon: Josephine Igo, DO;  Location: [facility] ENDOSCOPY;  Service: Pulmonary;;   INTRAOPERATIVE TRANSTHORACIC ECHOCARDIOGRAM Left 07/01/2020   Procedure: INTRAOPERATIVE TRANSTHORACIC ECHOCARDIOGRAM;  Surgeon: Tonny Bollman, MD;  Location: [facility] OR;  Service: Open Heart Surgery;  Laterality: Left;   KNEE ARTHROSCOPY WITH MEDIAL MENISECTOMY Right 02/28/2012   Procedure: KNEE ARTHROSCOPY WITH MEDIAL MENISECTOMY;  Surgeon: Vickki Hearing, MD;  Location: [facility] ORS;  Service: Orthopedics;  Laterality: Right;   REPAIR KNEE LIGAMENT     RIGHT/LEFT HEART CATH AND CORONARY ANGIOGRAPHY N/A 04/10/2020   Procedure: RIGHT/LEFT HEART CATH AND CORONARY ANGIOGRAPHY;  Surgeon: Corky Crafts, MD;  Location: [facility] INVASIVE CV LAB;  Service: Cardiovascular;  Laterality: N/A;   TRANSCATHETER AORTIC VALVE REPLACEMENT, TRANSFEMORAL N/A 07/01/2020   Procedure: TRANSCATHETER AORTIC VALVE REPLACEMENT, TRANSFEMORAL;  Surgeon: Tonny Bollman, MD;  Location: [facility] OR;  Service: Open Heart Surgery;  Laterality: N/A;   ULTRASOUND GUIDANCE FOR VASCULAR ACCESS  Bilateral 07/01/2020   Procedure: ULTRASOUND GUIDANCE FOR VASCULAR ACCESS;  Surgeon: Tonny Bollman, MD;  Location: [facility] OR;  Service: Open Heart Surgery;  Laterality: Bilateral;     reports that she has quit smoking. Her smoking use included cigarettes. She has never been exposed to tobacco smoke. She has never used smokeless tobacco. She reports that she does not drink alcohol and does not use drugs.  Allergies   Allergen Reactions   Amoxicillin Swelling    Tongue swelling    Penicillins Swelling    Has patient had a PCN reaction causing immediate rash, facial/tongue/throat swelling, SOB or lightheadedness with hypotension: No Has patient had a PCN reaction causing severe rash involving mucus membranes or skin necrosis: No Has patient had a PCN reaction that required hospitalization: No Has patient had a PCN reaction occurring within the last 10 years: No If all of the above answers are "NO", then may proceed with Cephalosporin use.    Sulfa Antibiotics Swelling    Lip swelling   Nitrofurantoin Nausea And Vomiting    Family History  Problem Relation Age of Onset   Lung disease Other    Cancer Other    Arthritis Other    Asthma Other    Diabetes Other    Diabetes Mother    Hypertension Mother    Diverticulitis Mother    Diabetes Father    Hypertension Sister    Diabetes Brother    Colon cancer Neg Hx     Prior to Admission medications   Medication Sig Start Date End Date Taking? Authorizing Provider  albuterol (PROVENTIL) (2.5 MG/3ML) 0.083% nebulizer solution USE ONE AMPULE VIA NEBULIZER EVERY 4 HOURS AS NEEDED FOR SHORTNESS OF BREATH OR WHEEZING 01/07/23   Nyoka Cowden, MD  BD PEN NEEDLE NANO U/F 32G X 4 MM MISC See admin instructions. 03/30/22   [provider]  Blood Pressure Monitor DEVI 1 each by Does not apply route daily. 09/29/22   Sharlene Dory, NP  budesonide (PULMICORT) 0.25 MG/2ML nebulizer solution One vial twice daily with albuterol 04/07/22   Nyoka Cowden, MD  carvedilol (COREG) 6.25 MG tablet Take 1 tablet (6.25 mg total) by mouth 2 (two) times daily. 05/06/22   Sharlene Dory, NP  FEROSUL 325 (65 Fe) MG tablet Take 325 mg by mouth daily.    [provider]  furosemide (LASIX) 40 MG tablet TAKE ONE TABLET BY MOUTH EVERY DAY 01/24/23   Jonelle Sidle, MD  gabapentin (NEURONTIN) 100 MG capsule Take 100-200 mg by mouth at bedtime. 10/02/19    [provider]  HYDROcodone-acetaminophen (NORCO/VICODIN) 5-325 MG tablet Take 2 tablets by mouth every 4 (four) hours as needed. 09/07/22   Kommor, Madison, MD  Insulin Detemir (LEVEMIR FLEXTOUCH) 100 UNIT/ML Pen Inject 30 Units into the skin at bedtime. Patient taking differently: Inject 40 Units into the skin at bedtime. 02/10/19   Johnson, Clanford L, MD  lisinopril (ZESTRIL) 2.5 MG tablet Take 1 tablet (2.5 mg total) by mouth daily. 09/29/22 02/21/23  Sharlene Dory, NP  metFORMIN (GLUCOPHAGE) 500 MG tablet Take 1 tablet (500 mg total) by mouth 2 (two) times daily. 07/02/20   Janetta Hora, PA-C    Physical Exam: Vitals:   04/07/23 1456 04/07/23 1457  BP: 134/80   Pulse: 78   Resp: 18   Temp: 99.5 F (37.5 C)   TempSrc: Oral   SpO2: 91%   Weight:  86.2 kg  Height:  5\' 5"  (1.651 m)  Constitutional: NAD, calm, comfortable Vitals:   04/07/23 1456 04/07/23 1457  BP: 134/80   Pulse: 78   Resp: 18   Temp: 99.5 F (37.5 C)   TempSrc: Oral   SpO2: 91%   Weight:  86.2 kg  Height:  5\' 5"  (1.651 m)   Eyes: PERRL, lids and conjunctivae normal ENMT: Mucous membranes are moist.   Neck: normal, supple, no masses, no thyromegaly Respiratory: clear to auscultation bilaterally, no wheezing, no crackles. Normal respiratory effort. No accessory muscle use.  Cardiovascular: Regular rate and rhythm, no murmurs / rubs / gallops. Trace bilat lower extremity edema.  Extremities warm. Abdomen: distended but soft, per patient at baseline, no tenderness, no masses palpated. No hepatosplenomegaly.  Musculoskeletal: no clubbing / cyanosis. No joint deformity upper and lower extremities.  Skin: no rashes, lesions, ulcers. No induration Neurologic: No facial asymmetry, moving extremity spontaneously, speech fluent. Psychiatric: Normal judgment and insight. Alert and oriented x 3. Normal mood.   Labs on Admission: I have personally reviewed following labs and imaging  studies  CBC: Recent Labs  Lab 04/07/23 1604  WBC 9.4  NEUTROABS 7.0  HGB 10.6*  HCT 34.1*  MCV 91.7  PLT 206   Basic Metabolic Panel: Recent Labs  Lab 04/07/23 1604  NA 139  K 4.5  CL 99  CO2 28  GLUCOSE 131*  BUN 39*  CREATININE 2.67*  CALCIUM 8.0*   GFR: Estimated Creatinine Clearance: 16.7 mL/min (A) (by C-G formula based on SCr of 2.67 mg/dL (H)). Liver Function Tests: Recent Labs  Lab 04/07/23 1604  AST 12*  ALT 9  ALKPHOS 91  BILITOT 0.7  PROT 6.9  ALBUMIN 3.3*   Urine analysis:    Component Value Date/Time   COLORURINE YELLOW 04/07/2023 1504   APPEARANCEUR CLOUDY (A) 04/07/2023 1504   APPEARANCEUR Hazy (A) 05/23/2020 1119   LABSPEC 1.017 04/07/2023 1504   PHURINE 5.0 04/07/2023 1504   GLUCOSEU NEGATIVE 04/07/2023 1504   HGBUR NEGATIVE 04/07/2023 1504   BILIRUBINUR NEGATIVE 04/07/2023 1504   BILIRUBINUR Negative 05/23/2020 1119   KETONESUR NEGATIVE 04/07/2023 1504   PROTEINUR NEGATIVE 04/07/2023 1504   UROBILINOGEN 0.2 04/25/2014 0936   NITRITE NEGATIVE 04/07/2023 1504   LEUKOCYTESUR TRACE (A) 04/07/2023 1504    Radiological Exams on Admission: CT ABDOMEN PELVIS WO CONTRAST Result Date: 04/07/2023 CLINICAL DATA:  Right lower quadrant abdominal pain for 2 days, history of diverticulitis EXAM: CT ABDOMEN AND PELVIS WITHOUT CONTRAST TECHNIQUE: Multidetector CT imaging of the abdomen and pelvis was performed following the standard protocol without IV contrast. Unenhanced CT was performed per clinician order. Lack of IV contrast limits sensitivity and specificity, especially for evaluation of abdominal/pelvic solid viscera. RADIATION DOSE REDUCTION: This exam was performed according to the departmental dose-optimization program which includes automated exposure control, adjustment of the mA and/or kV according to patient size and/or use of iterative reconstruction technique. COMPARISON:  09/07/2022 FINDINGS: Lower chest: Postinflammatory right lower lobe  scarring. No acute pleural or parenchymal lung disease. Hepatobiliary: Stable hepatic cysts. Gallbladder is unremarkable. No biliary duct dilation. Pancreas: Unremarkable unenhanced appearance. Spleen: Unremarkable unenhanced appearance. Adrenals/Urinary Tract: No urinary tract calculi or obstructive uropathy within either kidney. Stable right renal cortical cysts, which do not require specific imaging follow-up. Stable left adrenal adenoma. Bladder is decompressed, limiting its evaluation. Stomach/Bowel: No bowel obstruction or ileus. Diffuse colonic diverticulosis, most pronounced in the sigmoid colon, with no evidence of acute diverticulitis. No bowel wall thickening or inflammatory change. The appendix is surgically absent. Vascular/Lymphatic:  Aortic atherosclerosis. No enlarged abdominal or pelvic lymph nodes. Reproductive: Uterus and bilateral adnexa are unremarkable. Other: No free fluid or free intraperitoneal gas. No abdominal wall hernia. Musculoskeletal: No acute or destructive bony abnormalities. Postsurgical changes from L2-S1 fusion and L4-5 laminectomy. Stable severe spondylosis at L1-2. Reconstructed images demonstrate no additional findings. IMPRESSION: 1. No acute intra-abdominal or intrapelvic process. 2. Diffuse colonic diverticulosis without diverticulitis. 3.  Aortic Atherosclerosis (ICD10-I70.0). Electronically Signed   By: Sharlet Salina M.D.   On: 04/07/2023 17:47   EKG: None .   Assessment/Plan Principal Problem:   AKI (acute kidney injury) (HCC) Active Problems:   Essential hypertension, benign   DM (diabetes mellitus) (HCC)   S/P TAVR (transcatheter aortic valve replacement)   COPD GOLD 3 if use FEV1/VC   CKD stage 3b, GFR 30-44 ml/min (HCC)  Assessment and Plan: * AKI (acute kidney injury) (HCC) AKI on CKD stage IIIb.  Creatinine 2.67, from baseline 1.3-1.5.  She reports good oral intake, denies GI losses, med list shows Lasix 40 mg daily, she cannot confirm if she takes  this medication.  ACE inhibitor also on med list. -1 L bolus given, continue LR 100cc/hr x 15hrs -Hold Lasix, lisinopril  COPD GOLD 3 if use FEV1/VC Stable.  Resume home regimen  DM (diabetes mellitus) (HCC) - Hgba1c - SSI- S - Hold home Levemir, she cannot confirm dose she takes, hold metformin  Essential hypertension, benign Stable.  She is not exactly sure of the medications she takes. -Resume carvedilol 6.25 -Med reconciliation resume home medications.  Right upper abdominal/right flank pain- no vomiting, no diarrhea.  CT abdomen and pelvis without acute abnormality.  She denies NSAID use.  No melena.  Hemoglobin stable. -Conservative management for now pain control  DVT prophylaxis: Heparin Code Status:  FULL code Family Communication: None at bedside Disposition Plan: ~ 1- 2 days Consults called: None  Admission status:  Obs Med surg    Author: Onnie Boer, MD 04/07/2023 9:13 PM  For on call review [email or web address].

## 2023-04-07 NOTE — ED Provider Notes (Signed)
 [place] EMERGENCY DEPARTMENT AT [facility] Provider Note   CSN: [id number] Arrival date & time: 04/07/23  1425     History  Chief Complaint  Patient presents with   Abdominal Pain    Stephanie [name] is a 88 y.o. female with a history of stage 3 CKD, type 2 diabetes mellitus, and COPD presents the ED today for abdominal pain.  Patient endorses right flank pain for the past 2 weeks that has been persistent in nature.  Denies any alleviating or aggravating factors.  She does have some urinary frequency but denies changes to bowel habits, nausea, vomiting, or fevers.  Denies any injury or trauma prior to onset of symptoms.  States that her pain feels similar as to when she has had urinary tract infections in the back.  No additional complaints or concerns at this time.    Home Medications Prior to Admission medications   Medication Sig Start Date End Date Taking? Authorizing Provider  albuterol (PROVENTIL) (2.5 MG/3ML) 0.083% nebulizer solution USE ONE AMPULE VIA NEBULIZER EVERY 4 HOURS AS NEEDED FOR SHORTNESS OF BREATH OR WHEEZING 01/07/23   Nyoka Cowden, MD  BD PEN NEEDLE NANO U/F 32G X 4 MM MISC See admin instructions. 03/30/22   [provider]  Blood Pressure Monitor DEVI 1 each by Does not apply route daily. 09/29/22   Sharlene Dory, NP  budesonide (PULMICORT) 0.25 MG/2ML nebulizer solution One vial twice daily with albuterol 04/07/22   Nyoka Cowden, MD  carvedilol (COREG) 6.25 MG tablet Take 1 tablet (6.25 mg total) by mouth 2 (two) times daily. 05/06/22   Sharlene Dory, NP  FEROSUL 325 (65 Fe) MG tablet Take 325 mg by mouth daily.    [provider]  furosemide (LASIX) 40 MG tablet TAKE ONE TABLET BY MOUTH EVERY DAY 01/24/23   Jonelle Sidle, MD  gabapentin (NEURONTIN) 100 MG capsule Take 100-200 mg by mouth at bedtime. 10/02/19   [provider]  HYDROcodone-acetaminophen (NORCO/VICODIN) 5-325 MG tablet Take 2 tablets by mouth every  4 (four) hours as needed. 09/07/22   Kommor, Madison, MD  Insulin Detemir (LEVEMIR FLEXTOUCH) 100 UNIT/ML Pen Inject 30 Units into the skin at bedtime. Patient taking differently: Inject 40 Units into the skin at bedtime. 02/10/19   Johnson, Clanford L, MD  lisinopril (ZESTRIL) 2.5 MG tablet Take 1 tablet (2.5 mg total) by mouth daily. 09/29/22 02/21/23  Sharlene Dory, NP  metFORMIN (GLUCOPHAGE) 500 MG tablet Take 1 tablet (500 mg total) by mouth 2 (two) times daily. 07/02/20   Janetta Hora, PA-C      Allergies    Amoxicillin, Penicillins, Sulfa antibiotics, and Nitrofurantoin    Review of Systems   Review of Systems  Gastrointestinal:  Positive for abdominal pain.  All other systems reviewed and are negative.   Physical Exam Updated Vital Signs BP 134/80 (BP Location: Right Arm)   Pulse 78   Temp 99.5 F (37.5 C) (Oral)   Resp 18   Ht 5\' 5"  (1.651 m)   Wt 86.2 kg   SpO2 91%   BMI 31.62 kg/m  Physical Exam Vitals and nursing note reviewed.  Constitutional:      Appearance: Normal appearance.  HENT:     Head: Normocephalic and atraumatic.     Mouth/Throat:     Mouth: Mucous membranes are moist.  Eyes:     Conjunctiva/sclera: Conjunctivae normal.     Pupils: Pupils are equal, round, and reactive to light.  Cardiovascular:     Rate and Rhythm: Normal rate and regular rhythm.     Pulses: Normal pulses.     Heart sounds: Normal heart sounds.  Pulmonary:     Effort: Pulmonary effort is normal.     Breath sounds: Normal breath sounds.  Abdominal:     General: There is no distension.     Palpations: Abdomen is soft.     Tenderness: There is right CVA tenderness. There is no guarding or rebound.     Comments: Right-sided flank tenderness to palpation  Musculoskeletal:        General: Normal range of motion.     Cervical back: Normal range of motion.  Skin:    General: Skin is warm and dry.     Findings: No rash.  Neurological:     General: No focal deficit present.      Mental Status: She is alert.  Psychiatric:        Mood and Affect: Mood normal.        Behavior: Behavior normal.    ED Results / Procedures / Treatments   Labs (all labs ordered are listed, but only abnormal results are displayed) Labs Reviewed  URINALYSIS, ROUTINE W REFLEX MICROSCOPIC - Abnormal; Notable for the following components:      Result Value   APPearance CLOUDY (*)    Leukocytes,Ua TRACE (*)    Bacteria, UA RARE (*)    All other components within normal limits  COMPREHENSIVE METABOLIC PANEL - Abnormal; Notable for the following components:   Glucose, Bld 131 (*)    BUN 39 (*)    Creatinine, Ser 2.67 (*)    Calcium 8.0 (*)    Albumin 3.3 (*)    AST 12 (*)    GFR, Estimated 17 (*)    All other components within normal limits  CBC WITH DIFFERENTIAL/PLATELET - Abnormal; Notable for the following components:   RBC 3.72 (*)    Hemoglobin 10.6 (*)    HCT 34.1 (*)    Monocytes Absolute 1.1 (*)    All other components within normal limits  URINE CULTURE    EKG None  Radiology CT ABDOMEN PELVIS WO CONTRAST Result Date: 04/07/2023 CLINICAL DATA:  Right lower quadrant abdominal pain for 2 days, history of diverticulitis EXAM: CT ABDOMEN AND PELVIS WITHOUT CONTRAST TECHNIQUE: Multidetector CT imaging of the abdomen and pelvis was performed following the standard protocol without IV contrast. Unenhanced CT was performed per clinician order. Lack of IV contrast limits sensitivity and specificity, especially for evaluation of abdominal/pelvic solid viscera. RADIATION DOSE REDUCTION: This exam was performed according to the departmental dose-optimization program which includes automated exposure control, adjustment of the mA and/or kV according to patient size and/or use of iterative reconstruction technique. COMPARISON:  09/07/2022 FINDINGS: Lower chest: Postinflammatory right lower lobe scarring. No acute pleural or parenchymal lung disease. Hepatobiliary: Stable hepatic  cysts. Gallbladder is unremarkable. No biliary duct dilation. Pancreas: Unremarkable unenhanced appearance. Spleen: Unremarkable unenhanced appearance. Adrenals/Urinary Tract: No urinary tract calculi or obstructive uropathy within either kidney. Stable right renal cortical cysts, which do not require specific imaging follow-up. Stable left adrenal adenoma. Bladder is decompressed, limiting its evaluation. Stomach/Bowel: No bowel obstruction or ileus. Diffuse colonic diverticulosis, most pronounced in the sigmoid colon, with no evidence of acute diverticulitis. No bowel wall thickening or inflammatory change. The appendix is surgically absent. Vascular/Lymphatic: Aortic atherosclerosis. No enlarged abdominal or pelvic lymph nodes. Reproductive: Uterus and bilateral adnexa are unremarkable. Other: No free fluid  or free intraperitoneal gas. No abdominal wall hernia. Musculoskeletal: No acute or destructive bony abnormalities. Postsurgical changes from L2-S1 fusion and L4-5 laminectomy. Stable severe spondylosis at L1-2. Reconstructed images demonstrate no additional findings. IMPRESSION: 1. No acute intra-abdominal or intrapelvic process. 2. Diffuse colonic diverticulosis without diverticulitis. 3.  Aortic Atherosclerosis (ICD10-I70.0). Electronically Signed   By: Sharlet Salina M.D.   On: 04/07/2023 17:47    Procedures Procedures: not indicated.   Medications Ordered in ED Medications  lactated ringers bolus 1,000 mL (has no administration in time range)  HYDROcodone-acetaminophen (NORCO/VICODIN) 5-325 MG per tablet 1 tablet (1 tablet Oral Given 04/07/23 1750)    ED Course/ Medical Decision Making/ A&P                                 Medical Decision Making Amount and/or Complexity of Data Reviewed Labs: ordered. Radiology: ordered.  Risk Prescription drug management.   This patient presents to the ED for concern of flank pain, this involves an extensive number of treatment options, and is a  complaint that carries with it a high risk of complications and morbidity.   Differential diagnosis includes: UTI, pyelonephritis, kidney stone, colitis, cholecystitis, cholangitis, choledocholithiasis, muscle strain, muscle spasm, radiculopathy, etc.   Comorbidities  See HPI above   Additional History  Additional history obtained from prior records   Lab Tests  I ordered and personally interpreted labs.  The pertinent results include:   BUN of 39, creatinine of 2.67, GFR of 17, otherwise CMP is reassuring CBC is reassuring UA shows 21-50 squamous epithelial cells, rare bacteria, trace leukocytes.  Culture pending.   Imaging Studies  I ordered imaging studies including CT abdomen/pelvis without contrast  I independently visualized and interpreted imaging which showed:  No acute intra-abdominal or intrapelvic process. Diffuse colonic diverticulosis without diverticulitis. I agree with the radiologist interpretation   Consultations  I requested consultation with Dr. Mariea Clonts with [facility],  and discussed lab and imaging findings as well as pertinent plan - they recommend: Admission for further workup and evaluation for AKI.   Problem List / ED Course / Critical Interventions / Medication Management  Patient reports right-sided flank pain for the past 2 weeks.  Denies any injury or trauma prior to onset of symptoms.  Patient also endorses some urinary frequency which she says happens with UTIs despite being treated for one within the past several weeks.  No tenderness to the abdomen to palpation.  Endorses urinary frequency, which she states happens from time to time.  Denies hematuria, diarrhea, nausea, vomiting, abdominal pain, or fevers. Today: BUN of 36, Cr of 2.67, and GFR of 17; On 09/07/22: BUN of 22, Cr of 1.31, and GFR of 40. Patient is on Lasix. I ordered medications including: Percocet for pain Reevaluation of the patient after these medicines showed that the patient  improved. LRs given for AKI. I discussed labs and imaging with patient and members at bedside.  Patient denies history of dialysis in the past, but does admit to seeing a nephrologist which I do not have records of. Patient staffed with my attending, Dr. Estell Harpin.   Social Determinants of Health  History of tobacco use   Test / Admission - Considered  Discussed findings with patient and family at bedside.  All questions were answered.   They are agreeable with plan for admission.       Final Clinical Impression(s) / ED Diagnoses Final diagnoses:  AKI (acute kidney injury) [facility])    Rx / DC Orders ED Discharge Orders     None         Maxwell Marion, PA-C 04/07/23 1842    Bethann Berkshire, MD 04/08/23 1438

## 2023-04-07 NOTE — ED Triage Notes (Signed)
 Pt bib pov w/ c/o abdominal pain X 2 days. Pt reports pain is on her right side. Pt has a hx of diverticulitis. Pt denies any n/v/d. Pt lbm was normal and today. Pt denies any loss of appetite. Pt was recently on an antibiotic for a UTI

## 2023-04-07 NOTE — Assessment & Plan Note (Signed)
Stable.  Resume home regimen 

## 2023-04-08 DIAGNOSIS — N179 Acute kidney failure, unspecified: Secondary | ICD-10-CM | POA: Diagnosis not present

## 2023-04-08 LAB — BASIC METABOLIC PANEL
Anion gap: 12 (ref 5–15)
BUN: 39 mg/dL — ABNORMAL HIGH (ref 8–23)
CO2: 23 mmol/L (ref 22–32)
Calcium: 8.3 mg/dL — ABNORMAL LOW (ref 8.9–10.3)
Chloride: 102 mmol/L (ref 98–111)
Creatinine, Ser: 2.12 mg/dL — ABNORMAL HIGH (ref 0.44–1.00)
GFR, Estimated: 22 mL/min — ABNORMAL LOW (ref >60)
Glucose, Bld: 130 mg/dL — ABNORMAL HIGH (ref 70–99)
Potassium: 4.5 mmol/L (ref 3.5–5.1)
Sodium: 137 mmol/L (ref 135–145)

## 2023-04-08 LAB — URINE CULTURE: Culture: NO GROWTH

## 2023-04-08 LAB — CBG MONITORING, ED: Glucose-Capillary: 108 mg/dL — ABNORMAL HIGH (ref 70–99)

## 2023-04-08 LAB — HEMOGLOBIN A1C
Hgb A1c MFr Bld: 6.1 % — ABNORMAL HIGH (ref 4.8–5.6)
Mean Plasma Glucose: 128.37 mg/dL

## 2023-04-08 MED ORDER — HYDROCODONE-ACETAMINOPHEN 5-325 MG PO TABS: 1.0000 | ORAL_TABLET | Freq: Four times a day (QID) | ORAL | 0 refills | Status: DC | PRN

## 2023-04-08 NOTE — ED Notes (Signed)
 Assisted pt to bathroom and pt is now sitting in chair in room.

## 2023-04-08 NOTE — TOC CM/SW Note (Signed)
 Transition of Care [facility]) - Inpatient Brief Assessment   Patient Details  Name: Stephanie [name] MRN: [id number] Date of Birth: August 11, 1935  Transition of Care [facility]) CM/SW Contact:    Isabella Bowens, LCSWA Phone Number: 04/08/2023, 8:13 AM   Clinical Narrative:  Transition of Care Department [facility]) has reviewed patient and no TOC needs have been identified at this time. We will continue to monitor patient advancement through interdisciplinary progression rounds. If new patient transition needs arise, please place a TOC consult.  TOC will watch for oxygen needs.    Transition of Care Asessment: Insurance and Status: Insurance coverage has been reviewed Patient has primary care physician: Yes Home environment has been reviewed: Single Family Home Prior level of function:: Independent Prior/Current Home Services: No current home services Social Drivers of Health Review: SDOH reviewed no interventions necessary Readmission risk has been reviewed: Yes Transition of care needs: no transition of care needs at this time

## 2023-04-08 NOTE — Discharge Summary (Signed)
 Physician Discharge Summary   Patient: Stephanie [name] MRN: [id number] DOB: 05-Jan-1936  Admit date:     04/07/2023  Discharge date: 04/08/23  Discharge Physician: Stephanie [name]   PCP: [facility]   Recommendations at discharge:   At this time patient will be discharged home.  If you experience any symptoms such as fever, vomiting, shortness of breath, chest pain, abdominal pain, or other concerning symptoms, please call your primary care provider or go to the emergency department immediately.  Discharge Diagnoses: Principal Problem:   AKI (acute kidney injury) (HCC) Active Problems:   Essential hypertension, benign   DM (diabetes mellitus) (HCC)   S/P TAVR (transcatheter aortic valve replacement)   COPD GOLD 3 if use FEV1/VC   CKD stage 3b, GFR 30-44 ml/min (HCC)  Resolved Problems:   * No resolved hospital problems. Stephanie [name]: Stephanie [name] is a 88 y.o. female with medical history significant for COPD, hypertension, diabetes mellitus, TAVR. Patient presented to the ED with complaints of right flank pain of about 2 days duration.  Pain is persistent.  She denies urinary symptoms.  She was treated for UTI about a month ago.  She reports okay oral intake, no vomiting no loose stools.  No dizziness.  She is unsure if she is on Lasix that is listed on her medication list.  Assessment and Plan:  AKI (acute kidney injury) (HCC) -Likely prerenal etiology.  Showed improvement after IV fluid hydration.  No acute etiology on CT abdomen to suggest infectious etiology such as pyelonephritis.  Encourage continued p.o. oral hydration.  Flank pain - Appears mostly resolved.  Responded well to IV fluid hydration.  Patient herself believes it might be exacerbated by dehydration.  Will give short [name] of opioid medications as well.  COPD GOLD 3 if use FEV1/VC -Stable.  Resume home regimen  DM (diabetes mellitus) (HCC) -Resume home med  Essential  hypertension, benign -Resume home regimen        Pain control - [place] Controlled Substance Reporting System database was reviewed. and patient was instructed, not to drive, operate heavy machinery, perform activities at heights, swimming or participation in water activities or provide baby-sitting services while on Pain, Sleep and Anxiety Medications; until their outpatient Physician has advised to do so again. Also recommended to not to take more than prescribed Pain, Sleep and Anxiety Medications.  Consultants: None Procedures performed: None Disposition: Home Diet recommendation:  Discharge Diet Orders (From admission, onward)     Start     Ordered   04/08/23 0000  Diet - low sodium heart healthy        04/08/23 1026           Cardiac and Carb modified diet DISCHARGE MEDICATION: Allergies as of 04/08/2023       Reactions   Amoxicillin Swelling   Tongue swelling   Penicillins Swelling   Sulfa Antibiotics Swelling   Lip swelling   Nitrofurantoin Nausea And Vomiting        Medication List     TAKE these medications    albuterol (2.5 MG/3ML) 0.083% nebulizer solution Commonly known as: PROVENTIL USE ONE AMPULE VIA NEBULIZER EVERY 4 HOURS AS NEEDED FOR SHORTNESS OF BREATH OR WHEEZING   BD Pen Needle Nano U/F 32G X 4 MM Misc Generic drug: Insulin Pen Needle See admin instructions.   Blood Pressure Monitor Devi 1 each by Does not apply route daily.   carvedilol 6.25 MG tablet Commonly  known as: COREG Take 1 tablet (6.25 mg total) by mouth 2 (two) times daily.   cinacalcet 30 MG tablet Commonly known as: SENSIPAR Take 30 mg by mouth daily.   FeroSul 325 (65 Fe) MG tablet Generic drug: ferrous sulfate Take 325 mg by mouth daily.   furosemide 40 MG tablet Commonly known as: LASIX TAKE ONE TABLET BY MOUTH EVERY DAY   gabapentin 100 MG capsule Commonly known as: NEURONTIN Take 100-200 mg by mouth at bedtime.   GNP Vitamin D3 Extra Strength 25  MCG (1000 UT) tablet Generic drug: Cholecalciferol Take 1,000 Units by mouth daily.   hydrALAZINE 50 MG tablet Commonly known as: APRESOLINE Take 50 mg by mouth 2 (two) times daily.   HYDROcodone-acetaminophen 5-325 MG tablet Commonly known as: NORCO/VICODIN Take 1 tablet by mouth every 6 (six) hours as needed for moderate pain (pain score 4-6) or severe pain (pain score 7-10).   Levemir FlexTouch 100 UNIT/ML FlexTouch Pen Generic drug: insulin detemir Inject 30 Units into the skin at bedtime. What changed: how much to take   lisinopril 2.5 MG tablet Commonly known as: ZESTRIL Take 1 tablet (2.5 mg total) by mouth daily.        Discharge Exam: Filed Weights   04/07/23 1457  Weight: 86.2 kg   GENERAL:  Alert, pleasant, no acute distress  HEENT:  EOMI CARDIOVASCULAR:  RRR, no murmurs appreciated RESPIRATORY:  Clear to auscultation, no wheezing, rales, or rhonchi GASTROINTESTINAL:  Soft, nontender, nondistended EXTREMITIES:  No LE edema bilaterally NEURO:  No new focal deficits appreciated SKIN:  No rashes noted PSYCH:  Appropriate mood and affect    Condition at discharge: improving  The results of significant diagnostics from this hospitalization (including imaging, microbiology, ancillary and laboratory) are listed below for reference.   Imaging Studies: CT ABDOMEN PELVIS WO CONTRAST Result Date: 04/07/2023 CLINICAL DATA:  Right lower quadrant abdominal pain for 2 days, history of diverticulitis EXAM: CT ABDOMEN AND PELVIS WITHOUT CONTRAST TECHNIQUE: Multidetector CT imaging of the abdomen and pelvis was performed following the standard protocol without IV contrast. Unenhanced CT was performed per clinician order. Lack of IV contrast limits sensitivity and specificity, especially for evaluation of abdominal/pelvic solid viscera. RADIATION DOSE REDUCTION: This exam was performed according to the departmental dose-optimization program which includes automated exposure  control, adjustment of the mA and/or kV according to patient size and/or use of iterative reconstruction technique. COMPARISON:  09/07/2022 FINDINGS: Lower chest: Postinflammatory right lower lobe scarring. No acute pleural or parenchymal lung disease. Hepatobiliary: Stable hepatic cysts. Gallbladder is unremarkable. No biliary duct dilation. Pancreas: Unremarkable unenhanced appearance. Spleen: Unremarkable unenhanced appearance. Adrenals/Urinary Tract: No urinary tract calculi or obstructive uropathy within either kidney. Stable right renal cortical cysts, which do not require specific imaging follow-up. Stable left adrenal adenoma. Bladder is decompressed, limiting its evaluation. Stomach/Bowel: No bowel obstruction or ileus. Diffuse colonic diverticulosis, most pronounced in the sigmoid colon, with no evidence of acute diverticulitis. No bowel wall thickening or inflammatory change. The appendix is surgically absent. Vascular/Lymphatic: Aortic atherosclerosis. No enlarged abdominal or pelvic lymph nodes. Reproductive: Uterus and bilateral adnexa are unremarkable. Other: No free fluid or free intraperitoneal gas. No abdominal wall hernia. Musculoskeletal: No acute or destructive bony abnormalities. Postsurgical changes from L2-S1 fusion and L4-5 laminectomy. Stable severe spondylosis at L1-2. Reconstructed images demonstrate no additional findings. IMPRESSION: 1. No acute intra-abdominal or intrapelvic process. 2. Diffuse colonic diverticulosis without diverticulitis. 3.  Aortic Atherosclerosis (ICD10-I70.0). Electronically Signed   By: Sharlet Salina  M.D.   On: 04/07/2023 17:47    Microbiology: Results for orders placed or performed during the hospital encounter of 03/02/22  SARS Coronavirus 2 by RT PCR (hospital order, performed in [facility] hospital lab) *cepheid single result test* Anterior Nasal Swab     Status: None   Collection Time: 03/02/22  8:02 AM   Specimen: Anterior Nasal Swab  Result Value  Ref Range Status   SARS Coronavirus 2 by RT PCR NEGATIVE NEGATIVE Final    Comment: Performed at [facility], [address], [place], [place] [address]    Labs: CBC: Recent Labs  Lab 04/07/23 1604  WBC 9.4  NEUTROABS 7.0  HGB 10.6*  HCT 34.1*  MCV 91.7  PLT 206   Basic Metabolic Panel: Recent Labs  Lab 04/07/23 1604 04/08/23 0635  NA 139 137  K 4.5 4.5  CL 99 102  CO2 28 23  GLUCOSE 131* 130*  BUN 39* 39*  CREATININE 2.67* 2.12*  CALCIUM 8.0* 8.3*   Liver Function Tests: Recent Labs  Lab 04/07/23 1604  AST 12*  ALT 9  ALKPHOS 91  BILITOT 0.7  PROT 6.9  ALBUMIN 3.3*   CBG: Recent Labs  Lab 04/07/23 2022 04/07/23 2222 04/08/23 0830  GLUCAP 107* 170* 108*    Discharge time spent: less than 30 minutes.  Signed: [name], DO [facility] 04/08/2023

## 2023-04-08 NOTE — ED Notes (Signed)
 Pt ambulated to restroom, on return to room, pt appeared to be SOB, increased work of breathing. Saturations 87-90% on RA. Pt request a breathing treatment, says she feels this will help her breathing.

## 2023-04-08 NOTE — ED Notes (Signed)
 Pt ambulated to restroom and moved from chair to bed.

## 2023-04-12 ENCOUNTER — Other Ambulatory Visit: Payer: Self-pay

## 2023-04-12 ENCOUNTER — Emergency Department (HOSPITAL_COMMUNITY)

## 2023-04-12 ENCOUNTER — Emergency Department (HOSPITAL_COMMUNITY)
Admission: EM | Admit: 2023-04-12 | Discharge: 2023-04-12 | Disposition: A | Discharge status: Home / Self Care | Attending: Emergency Medicine | Admitting: Emergency Medicine

## 2023-04-12 DIAGNOSIS — Z85118 Personal history of other malignant neoplasm of bronchus and lung: Secondary | ICD-10-CM | POA: Diagnosis not present

## 2023-04-12 DIAGNOSIS — I10 Essential (primary) hypertension: Secondary | ICD-10-CM | POA: Insufficient documentation

## 2023-04-12 DIAGNOSIS — Z79899 Other long term (current) drug therapy: Secondary | ICD-10-CM | POA: Diagnosis not present

## 2023-04-12 DIAGNOSIS — J45909 Unspecified asthma, uncomplicated: Secondary | ICD-10-CM | POA: Diagnosis not present

## 2023-04-12 DIAGNOSIS — N644 Mastodynia: Secondary | ICD-10-CM | POA: Diagnosis not present

## 2023-04-12 DIAGNOSIS — Z794 Long term (current) use of insulin: Secondary | ICD-10-CM | POA: Insufficient documentation

## 2023-04-12 DIAGNOSIS — E119 Type 2 diabetes mellitus without complications: Secondary | ICD-10-CM | POA: Insufficient documentation

## 2023-04-12 DIAGNOSIS — R0789 Other chest pain: Secondary | ICD-10-CM | POA: Insufficient documentation

## 2023-04-12 LAB — COMPREHENSIVE METABOLIC PANEL
ALT: 7 U/L (ref 0–44)
AST: 13 U/L — ABNORMAL LOW (ref 15–41)
Albumin: 3.6 g/dL (ref 3.5–5.0)
Alkaline Phosphatase: 92 U/L (ref 38–126)
Anion gap: 14 (ref 5–15)
BUN: 33 mg/dL — ABNORMAL HIGH (ref 8–23)
CO2: 28 mmol/L (ref 22–32)
Calcium: 8.4 mg/dL — ABNORMAL LOW (ref 8.9–10.3)
Chloride: 95 mmol/L — ABNORMAL LOW (ref 98–111)
Creatinine, Ser: 2.34 mg/dL — ABNORMAL HIGH (ref 0.44–1.00)
GFR, Estimated: 20 mL/min — ABNORMAL LOW (ref >60)
Glucose, Bld: 96 mg/dL (ref 70–99)
Potassium: 4.3 mmol/L (ref 3.5–5.1)
Sodium: 137 mmol/L (ref 135–145)
Total Bilirubin: 0.6 mg/dL (ref 0.0–1.2)
Total Protein: 7.6 g/dL (ref 6.5–8.1)

## 2023-04-12 LAB — CBC WITH DIFFERENTIAL/PLATELET
Abs Immature Granulocytes: 0.04 10*3/uL (ref 0.00–0.07)
Basophils Absolute: 0 10*3/uL (ref 0.0–0.1)
Basophils Relative: 0 %
Eosinophils Absolute: 0.2 10*3/uL (ref 0.0–0.5)
Eosinophils Relative: 2 %
HCT: 38.4 % (ref 36.0–46.0)
Hemoglobin: 11.5 g/dL — ABNORMAL LOW (ref 12.0–15.0)
Immature Granulocytes: 0 %
Lymphocytes Relative: 11 %
Lymphs Abs: 1.1 10*3/uL (ref 0.7–4.0)
MCH: 27.8 pg (ref 26.0–34.0)
MCHC: 29.9 g/dL — ABNORMAL LOW (ref 30.0–36.0)
MCV: 92.8 fL (ref 80.0–100.0)
Monocytes Absolute: 1.1 10*3/uL — ABNORMAL HIGH (ref 0.1–1.0)
Monocytes Relative: 11 %
Neutro Abs: 7.5 10*3/uL (ref 1.7–7.7)
Neutrophils Relative %: 76 %
Platelets: 267 10*3/uL (ref 150–400)
RBC: 4.14 MIL/uL (ref 3.87–5.11)
RDW: 14.7 % (ref 11.5–15.5)
WBC: 10 10*3/uL (ref 4.0–10.5)
nRBC: 0 % (ref 0.0–0.2)

## 2023-04-12 MED ORDER — TRAMADOL HCL 50 MG PO TABS: 50.0000 mg | ORAL_TABLET | Freq: Four times a day (QID) | ORAL | 0 refills | Status: DC | PRN

## 2023-04-12 NOTE — ED Notes (Signed)
 Patient discharged. Provider spoke to patient. Paperwork given to patient and reviewed. Pt verbalized understanding. VSS. A+Ox4. Patient ambulated out of the ER with steady, independent gait. No iv in place.

## 2023-04-12 NOTE — ED Triage Notes (Signed)
 Pt c/o R breast swelling and tenderness x 1 month, states it recently started to get painful upon palpitation, denies any drainage from breast. Rates pain 10/10 that is constant

## 2023-04-12 NOTE — Discharge Instructions (Addendum)
 Your exam suggests your pain is coming from your chest wall, as you are tender across these ribs.  Prescribing you a mild pain pill to help you with this pain.  You may also benefit from an ice pack applied to the site is much as is comfortable.  Plan to see your pulmonologist next week as planned.  I recommend taking a stool softener while you are on this pain medicine as it can cause constipation.

## 2023-04-12 NOTE — ED Provider Notes (Signed)
 [place] EMERGENCY DEPARTMENT AT [facility] Provider Note   CSN: [id number] Arrival date & time: 04/12/23  1305     History {Add pertinent medical, surgical, social history, OB history to HPI:1} Chief Complaint  Patient presents with   Breast Problem    Stephanie [name] is a 88 y.o. female.  The history is provided by the patient and a relative (dg at bedside).       Home Medications Prior to Admission medications   Medication Sig Start Date End Date Taking? Authorizing Provider  albuterol (PROVENTIL) (2.5 MG/3ML) 0.083% nebulizer solution USE ONE AMPULE VIA NEBULIZER EVERY 4 HOURS AS NEEDED FOR SHORTNESS OF BREATH OR WHEEZING 01/07/23   Nyoka Cowden, MD  BD PEN NEEDLE NANO U/F 32G X 4 MM MISC See admin instructions. 03/30/22   [provider]  Blood Pressure Monitor DEVI 1 each by Does not apply route daily. 09/29/22   Sharlene Dory, NP  carvedilol (COREG) 6.25 MG tablet Take 1 tablet (6.25 mg total) by mouth 2 (two) times daily. 05/06/22   Sharlene Dory, NP  cinacalcet (SENSIPAR) 30 MG tablet Take 30 mg by mouth daily. 03/31/23   [provider]  FEROSUL 325 (65 Fe) MG tablet Take 325 mg by mouth daily.    [provider]  furosemide (LASIX) 40 MG tablet TAKE ONE TABLET BY MOUTH EVERY DAY 01/24/23   Jonelle Sidle, MD  gabapentin (NEURONTIN) 100 MG capsule Take 100-200 mg by mouth at bedtime. 10/02/19   [provider]  GNP VITAMIN D3 EXTRA STRENGTH 25 MCG (1000 UT) tablet Take 1,000 Units by mouth daily. 03/31/23   [provider]  hydrALAZINE (APRESOLINE) 50 MG tablet Take 50 mg by mouth 2 (two) times daily. 03/31/23   [provider]  HYDROcodone-acetaminophen (NORCO/VICODIN) 5-325 MG tablet Take 1 tablet by mouth every 6 (six) hours as needed for moderate pain (pain score 4-6) or severe pain (pain score 7-10). 04/08/23   [name], DO  Insulin Detemir (LEVEMIR FLEXTOUCH) 100 UNIT/ML Pen Inject 30  Units into the skin at bedtime. Patient taking differently: Inject 40 Units into the skin at bedtime. 02/10/19   Johnson, Clanford L, MD  lisinopril (ZESTRIL) 2.5 MG tablet Take 1 tablet (2.5 mg total) by mouth daily. 09/29/22 04/07/23  Sharlene Dory, NP      Allergies    Amoxicillin, Penicillins, Sulfa antibiotics, and Nitrofurantoin    Review of Systems   Review of Systems  Physical Exam Updated Vital Signs BP (!) 138/59 (BP Location: Right Arm)   Pulse 60   Temp 98.5 F (36.9 C) (Oral)   SpO2 93%  Physical Exam  ED Results / Procedures / Treatments   Labs (all labs ordered are listed, but only abnormal results are displayed) Labs Reviewed  CBC WITH DIFFERENTIAL/PLATELET - Abnormal; Notable for the following components:      Result Value   Hemoglobin 11.5 (*)    MCHC 29.9 (*)    Monocytes Absolute 1.1 (*)    All other components within normal limits  COMPREHENSIVE METABOLIC PANEL - Abnormal; Notable for the following components:   Chloride 95 (*)    BUN 33 (*)    Creatinine, Ser 2.34 (*)    Calcium 8.4 (*)    AST 13 (*)    GFR, Estimated 20 (*)    All other components within normal limits    EKG None  Radiology DG Ribs Unilateral W/Chest Right Result Date: 04/12/2023 CLINICAL DATA:  rib pain. Right breast swelling and tenderness for 1 month. EXAM: RIGHT RIBS AND CHEST - 3+ VIEW COMPARISON:  09/21/2022. FINDINGS: Redemonstration of nonspecific opacity overlying the right lower lung zone, grossly similar to the prior study from 09/21/2022. There is adjacent fiducial marker. There is also focal irregular pleural thickening/pleural calcified plaque along the right upper hemithorax, laterally. Bilateral lung fields are otherwise clear. No acute consolidation or lung collapse. Bilateral costophrenic angles are clear. Stable cardio-mediastinal silhouette. Prosthetic aortic valve noted. No acute osseous abnormalities. No acute rib fracture or focal rib lesion. The soft tissues  are within normal limits. IMPRESSION: 1. No acute cardiopulmonary abnormality. No acute displaced rib fracture or focal rib lesion. 2. Redemonstration of nonspecific opacity overlying the right lower lung zone, grossly similar to the prior study from 09/21/2022. There is adjacent fiducial marker. There is also focal irregular pleural thickening/calcified plaque along the right upper hemithorax, laterally. Electronically Signed   By: Jules Schick M.D.   On: 04/12/2023 17:12    Procedures Procedures  {Document cardiac monitor, telemetry assessment procedure when appropriate:1}  Medications Ordered in ED Medications - No data to display  ED Course/ Medical Decision Making/ A&P   {   Click here for ABCD2, HEART and other calculatorsREFRESH Note before signing :1}                              Medical Decision Making Amount and/or Complexity of Data Reviewed Labs: ordered. Radiology: ordered.   ***  {Document critical care time when appropriate:1} {Document review of labs and clinical decision tools ie heart score, Chads2Vasc2 etc:1}  {Document your independent review of radiology images, and any outside records:1} {Document your discussion with family members, caretakers, and with consultants:1} {Document social determinants of health affecting pt's care:1} {Document your decision making why or why not admission, treatments were needed:1} Final Clinical Impression(s) / ED Diagnoses Final diagnoses:  None    Rx / DC Orders ED Discharge Orders     None

## 2023-04-14 ENCOUNTER — Other Ambulatory Visit: Payer: Self-pay | Admitting: Nurse Practitioner

## 2023-04-24 NOTE — Progress Notes (Unsigned)
 Stephanie [name], female    DOB: 1935-08-12   MRN: [id number]   Brief patient profile:  32 yobf  quit smoking 2000 with doe then  referred to pulmonary clinic in [place]  03/31/2021 by Stephanie [name] for spn     History of Present Illness  03/31/2021  Pulmonary/ 1st office eval/ Stephanie [name] / [place] Office  Chief Complaint  Patient presents with   Consult    CT done in Feb 2023 showed pulmonary nodules.   Dyspnea:  can still do [employer] but usually uses scooter due to back  Also walks [place] slow pace s doe  Cough: none  Sleep: 2 pillows bed is flat SABA use: neb not even once a month  Rec We will schedule you for PFTs > not done as of 11/13/2021  also contact you in the future for CT @ 1 years from the last.  Call sooner for worse breathing or chest pain when you breathe in or cough that won't go away after a few weeks.   11/13/2021  f/u ov/[place] office/Stephanie [name] re: Nodule f/u maint on no resp rx   Chief Complaint  Patient presents with   Follow-up    Lung nodule seen on CT   Dyspnea: rides scooter for years due to back / walks next door to sister with rollator  Cough: none  Sleeping: 2 pillows/ flat bed  SABA use: has neb, not using  02: none  Covid status: vax / never infected  Rec My office will be contacting you by phone for referral to [facility] office for PFTs and same day visit with Stephanie [name] or Stephanie [name]  > dx PET pos RLL SPN> SBRT planned       04/07/2022  f/u ov/Stephanie [name] re: GOLD 3 copd  maint on no rx  / SBRT starting 04/08/22  Chief Complaint  Patient presents with   Follow-up    Lung nodule.  Starting chemotherapy tomorrow at [facility].  Patient states wheezing last week.  Seen at [facility]. Does not know rx given.  Dyspnea:  balance is limiting / walks with rollator to sisters house next door  Cough: none  Sleeping: bed is flat 2 pillows or loses breath SABA use: neb prn bad attacks  02: none  Rec Albuterol 2.5 mg with budesonide 0.25 mg twice  daily in nebulizer  routinely (automatically)  and up to every 4 hours if needed Please schedule a follow up visit in 3 months but call sooner if needed in [place] office     09/30/2022  f/u ov/[place] office/Stephanie [name] re: GOLD 3 copd  maint on alb/bud bid and rarely  needs prn saba  Chief Complaint  Patient presents with   Follow-up    Follow up , pt was not able to do bx b/c her heart rate was dropping at the time of producer   Dyspnea:  rarely limited but very sedentary  Cough: none  Sleeping: flat bed 2 pillows s  resp cc  SABA use: rarely  02: none   Rec No change in medications Please schedule a follow up visit in 6  months but call sooner if needed    04/27/2023  f/u ov/[place] office/Stephanie [name] re: GOLD 3 copd/ f/u spn   maint on prn saba   Chief Complaint  Patient presents with   Follow-up    6 month follow up  pt has no new concerns   Dyspnea:  back at [employer] pushing cart limited more by  balance than doe  Cough: none  Sleeping: flat bed 2 pillows s  resp cc  SABA use: once a week neb  02: none    No obvious day to day or daytime variability or assoc excess/ purulent sputum or mucus plugs or hemoptysis or cp or chest tightness, subjective wheeze or overt sinus or hb symptoms.    Also denies any obvious fluctuation of symptoms with weather or environmental changes or other aggravating or alleviating factors except as outlined above   No unusual exposure hx or h/o childhood pna/ asthma or knowledge of premature birth.  Current Allergies, Complete Past Medical History, Past Surgical History, Family History, and Social History were reviewed in [employer] record.  ROS  The following are not active complaints unless bolded Hoarseness, sore throat, dysphagia, dental problems, itching, sneezing,  nasal congestion or discharge of excess mucus or purulent secretions, ear ache,   fever, chills, sweats, unintended wt loss or wt gain, classically pleuritic or  exertional cp,  orthopnea pnd or arm/hand swelling  or leg swelling, presyncope, palpitations, abdominal pain, anorexia, nausea, vomiting, diarrhea  or change in bowel habits or change in bladder habits, change in stools or change in urine, dysuria, hematuria,  rash, arthralgias, visual complaints, headache, numbness, weakness or ataxia or problems with walking or coordination,  change in mood or  memory.        Current Meds  Medication Sig   albuterol (PROVENTIL) (2.5 MG/3ML) 0.083% nebulizer solution USE ONE AMPULE VIA NEBULIZER EVERY 4 HOURS AS NEEDED FOR SHORTNESS OF BREATH OR WHEEZING   BD PEN NEEDLE NANO U/F 32G X 4 MM MISC See admin instructions.   Blood Pressure Monitor DEVI 1 each by Does not apply route daily.   carvedilol (COREG) 6.25 MG tablet TAKE ONE TABLET BY MOUTH TWICE DAILY   cinacalcet (SENSIPAR) 30 MG tablet Take 30 mg by mouth daily.   FEROSUL 325 (65 Fe) MG tablet Take 325 mg by mouth daily.   furosemide (LASIX) 40 MG tablet TAKE ONE TABLET BY MOUTH EVERY DAY   gabapentin (NEURONTIN) 100 MG capsule Take 100-200 mg by mouth at bedtime.   GNP VITAMIN D3 EXTRA STRENGTH 25 MCG (1000 UT) tablet Take 1,000 Units by mouth daily.   hydrALAZINE (APRESOLINE) 50 MG tablet Take 50 mg by mouth 2 (two) times daily.   HYDROcodone-acetaminophen (NORCO/VICODIN) 5-325 MG tablet Take 1 tablet by mouth every 6 (six) hours as needed for moderate pain (pain score 4-6) or severe pain (pain score 7-10).   Insulin Detemir (LEVEMIR FLEXTOUCH) 100 UNIT/ML Pen Inject 30 Units into the skin at bedtime. (Patient taking differently: Inject 40 Units into the skin at bedtime.)   traMADol (ULTRAM) 50 MG tablet Take 1 tablet (50 mg total) by mouth every 6 (six) hours as needed for severe pain (pain score 7-10).           Past Medical History:  Diagnosis Date   Arthritis    Asthma    CKD (chronic kidney disease)    Essential hypertension    Mixed hyperlipidemia    S/P TAVR (transcatheter aortic valve  replacement) 07/01/2020   s/p TAVR with a 26 mm Edwards S3U via the TF approach by Stephanie. Excell Seltzer and Stephanie. Laneta Simmers.   Severe aortic stenosis    Symptomatic anemia    Presumed GI bleed January 2021 - she declined GI work-up   Type 2 diabetes mellitus (HCC)         Objective:  Wts  04/27/2023          197  09/30/2022       225   04/07/2022       189  11/13/21 181 lb 3.2 oz (82.2 kg)  10/14/21 183 lb 6.4 oz (83.2 kg)  10/13/21 225 lb (102.1 kg)      Vital signs reviewed  04/27/2023  - Note at rest 02 sats  92% on RA   General appearance:    obese bf  W/c  bound but climbed on exam table ok     HEENT : Oropharynx  clear   Nasal turbinates nl    NECK :  without  apparent JVD/ palpable Nodes/TM    LUNGS: no acc muscle use,  Min barrel  contour chest wall with bilateral  slightly decreased bs s audible wheeze and  without cough on insp or exp maneuvers and min  Hyperresonant  to  percussion bilaterally    CV:  RRR  no s3  2-3/6 sem  s increase in P2, and no edema   ABD: obese  soft and nontender    MS:  Nl gait/ ext warm without deformities Or obvious joint restrictions  calf tenderness, cyanosis or clubbing     SKIN: warm and dry without lesions    NEURO:  alert, approp, nl sensorium with  no motor or cerebellar deficits apparent.                    Assessment

## 2023-04-27 ENCOUNTER — Ambulatory Visit: Admitting: Internal Medicine

## 2023-04-27 ENCOUNTER — Encounter: Payer: Self-pay | Admitting: Internal Medicine

## 2023-04-27 VITALS — BP 125/82 | HR 68 | Ht 65.0 in | Wt 197.8 lb

## 2023-04-27 DIAGNOSIS — R911 Solitary pulmonary nodule: Secondary | ICD-10-CM

## 2023-04-27 DIAGNOSIS — J449 Chronic obstructive pulmonary disease, unspecified: Secondary | ICD-10-CM | POA: Diagnosis not present

## 2023-04-27 NOTE — Patient Instructions (Signed)
 Also  Ok to try albuterol before 15 min before an activity (on alternating days)  that you know would usually make you short of breath and see if it makes any difference and if makes none then don't take albuterol after activity unless you can't catch your breath as this means it's the resting that helps, not the albuterol.  My office will be contacting you by phone for referral to [facility] for CT chest   - if you don't hear back from my office within one week please call us back or notify us thru MyChart and we'll address it right away.    If you are satisfied with your treatment plan,  let your doctor know and he/she can either refill your medications or you can return here when your prescription runs out.     If in any way you are not 100% satisfied,  please tell us.  If 100% better, tell your friends!  Pulmonary follow up is as needed

## 2023-04-27 NOTE — Assessment & Plan Note (Signed)
 Quit smoking around 2000 - PET  04/22/20 Low metabolic activity associated with the RIGHT upper lobe nodule is not typical of bronchogenic carcinoma; however, low-grade adenocarcinoma can exhibit low metabolic activity. As lesion is increased in size/density from 2015 recommend tissue samplin - CTa 03/05/21 1.6 x 0.8 cm mixed semi-solid nodule with irregular and ground-glass margins located in the anterior aspect of the right lower lobe no change since 11/19/20 so rec f/u in one year >  Placed in reminder file  - CT  10/30/21  1.7 x 1.0 cm irregular ill-defined nodule in the anterior right lower lobe, contiguous with the fissure has increased minimally in size since the prior study but qualitatively appears more confluent today. Imaging features are highly suspicious for neoplasm - FOB / Icard 03/02/22 atypical cells > SRT completed  - f/u CT chest s contrast 04/27/2023 >>>   Need f/u one year out from SBRT if not longer being followed by RT   Discussed in detail all the  indications, usual  risks and alternatives  relative to the benefits with patient who agrees to proceed with w/u as outlined.            Each maintenance medication was reviewed in detail including emphasizing most importantly the difference between maintenance and prns and under what circumstances the prns are to be triggered using an action plan format where appropriate.  Total time for H and P, chart review, counseling, reviewing neb  device(s) and generating customized AVS unique to this office visit / same day charting = 24 min

## 2023-04-27 NOTE — Assessment & Plan Note (Signed)
 Quit smoking 2000 - PFT's  02/05/22  FEV1 0.91 (47 % ) ratio 0.70  (with shortened Texp)  p 3 % improvement from saba p 0 prior to study with DLCO  22 (115%)   and FV curve slt concave  with fev1/svc = 56% - 04/07/2022  After extensive coaching inhaler device,  effectiveness =    0% so try adding bud 0.25 mg to albuterol bid and prn   Presently rarely using saba at all and no longer with budesonide which is probably acceptable and reflects her breathing is really not the limiting issue and can continue prn saba indef per PCP  Pulmonary f/u can be prn

## 2023-05-02 ENCOUNTER — Telehealth: Payer: Self-pay | Admitting: Radiation Oncology

## 2023-05-02 NOTE — Telephone Encounter (Signed)
 4/14 @ 10:27 am patient's daughter called to confirm the exact date her mother was diagnosis with Lung Cancer.  Referred patient's daughter to Dr. Glenis Langdon office.  Phone number was given as requested.

## 2023-05-03 ENCOUNTER — Telehealth: Payer: Self-pay

## 2023-05-03 ENCOUNTER — Other Ambulatory Visit: Payer: Self-pay

## 2023-05-03 ENCOUNTER — Encounter (HOSPITAL_COMMUNITY): Payer: Self-pay | Admitting: Emergency Medicine

## 2023-05-03 ENCOUNTER — Emergency Department (HOSPITAL_COMMUNITY)
Admission: EM | Admit: 2023-05-03 | Discharge: 2023-05-03 | Disposition: A | Discharge status: Home / Self Care | Attending: Emergency Medicine | Admitting: Emergency Medicine

## 2023-05-03 ENCOUNTER — Telehealth: Payer: Self-pay | Admitting: *Deleted

## 2023-05-03 ENCOUNTER — Emergency Department (HOSPITAL_COMMUNITY)

## 2023-05-03 ENCOUNTER — Other Ambulatory Visit

## 2023-05-03 DIAGNOSIS — I129 Hypertensive chronic kidney disease with stage 1 through stage 4 chronic kidney disease, or unspecified chronic kidney disease: Secondary | ICD-10-CM | POA: Insufficient documentation

## 2023-05-03 DIAGNOSIS — E1122 Type 2 diabetes mellitus with diabetic chronic kidney disease: Secondary | ICD-10-CM | POA: Diagnosis not present

## 2023-05-03 DIAGNOSIS — R002 Palpitations: Secondary | ICD-10-CM | POA: Diagnosis present

## 2023-05-03 DIAGNOSIS — N189 Chronic kidney disease, unspecified: Secondary | ICD-10-CM | POA: Diagnosis not present

## 2023-05-03 DIAGNOSIS — Z794 Long term (current) use of insulin: Secondary | ICD-10-CM | POA: Insufficient documentation

## 2023-05-03 DIAGNOSIS — I480 Paroxysmal atrial fibrillation: Secondary | ICD-10-CM

## 2023-05-03 DIAGNOSIS — I491 Atrial premature depolarization: Secondary | ICD-10-CM | POA: Insufficient documentation

## 2023-05-03 LAB — CBC
HCT: 35.4 % — ABNORMAL LOW (ref 36.0–46.0)
Hemoglobin: 11.1 g/dL — ABNORMAL LOW (ref 12.0–15.0)
MCH: 28 pg (ref 26.0–34.0)
MCHC: 31.4 g/dL (ref 30.0–36.0)
MCV: 89.2 fL (ref 80.0–100.0)
Platelets: 281 10*3/uL (ref 150–400)
RBC: 3.97 MIL/uL (ref 3.87–5.11)
RDW: 14.2 % (ref 11.5–15.5)
WBC: 10.9 10*3/uL — ABNORMAL HIGH (ref 4.0–10.5)
nRBC: 0 % (ref 0.0–0.2)

## 2023-05-03 LAB — BASIC METABOLIC PANEL WITH GFR
Anion gap: 12 (ref 5–15)
BUN: 37 mg/dL — ABNORMAL HIGH (ref 8–23)
CO2: 27 mmol/L (ref 22–32)
Calcium: 8.4 mg/dL — ABNORMAL LOW (ref 8.9–10.3)
Chloride: 98 mmol/L (ref 98–111)
Creatinine, Ser: 2.2 mg/dL — ABNORMAL HIGH (ref 0.44–1.00)
GFR, Estimated: 21 mL/min — ABNORMAL LOW (ref >60)
Glucose, Bld: 78 mg/dL (ref 70–99)
Potassium: 4.5 mmol/L (ref 3.5–5.1)
Sodium: 137 mmol/L (ref 135–145)

## 2023-05-03 LAB — CBG MONITORING, ED
Glucose-Capillary: 69 mg/dL — ABNORMAL LOW (ref 70–99)
Glucose-Capillary: 75 mg/dL (ref 70–99)

## 2023-05-03 NOTE — Telephone Encounter (Signed)
 Dr. Mallipeddi requested a 14 day live Zio for AFib. Pt enrolled in Zio.

## 2023-05-03 NOTE — ED Provider Notes (Signed)
 [facility] Provider Note   CSN: [id number] Arrival date & time: 05/03/23  1044     History {Add pertinent medical, surgical, social history, OB history to HPI:1} Chief Complaint  Patient presents with   Palpitations    Stephanie [name] is a 88 y.o. female.  HPI     Patient comes in with chief complaint Home Medications Prior to Admission medications   Medication Sig Start Date End Date Taking? Authorizing Provider  albuterol (PROVENTIL) (2.5 MG/3ML) 0.083% nebulizer solution USE ONE AMPULE VIA NEBULIZER EVERY 4 HOURS AS NEEDED FOR SHORTNESS OF BREATH OR WHEEZING 01/07/23   Nyoka Cowden, MD  BD PEN NEEDLE NANO U/F 32G X 4 MM MISC See admin instructions. 03/30/22   [provider]  Blood Pressure Monitor DEVI 1 each by Does not apply route daily. 09/29/22   Sharlene Dory, NP  carvedilol (COREG) 6.25 MG tablet TAKE ONE TABLET BY MOUTH TWICE DAILY 04/14/23   Sharlene Dory, NP  cinacalcet (SENSIPAR) 30 MG tablet Take 30 mg by mouth daily. 03/31/23   [provider]  FEROSUL 325 (65 Fe) MG tablet Take 325 mg by mouth daily.    [provider]  furosemide (LASIX) 40 MG tablet TAKE ONE TABLET BY MOUTH EVERY DAY 01/24/23   Jonelle Sidle, MD  gabapentin (NEURONTIN) 100 MG capsule Take 100-200 mg by mouth at bedtime. 10/02/19   [provider]  GNP VITAMIN D3 EXTRA STRENGTH 25 MCG (1000 UT) tablet Take 1,000 Units by mouth daily. 03/31/23   [provider]  hydrALAZINE (APRESOLINE) 50 MG tablet Take 50 mg by mouth 2 (two) times daily. 03/31/23   [provider]  HYDROcodone-acetaminophen (NORCO/VICODIN) 5-325 MG tablet Take 1 tablet by mouth every 6 (six) hours as needed for moderate pain (pain score 4-6) or severe pain (pain score 7-10). 04/08/23   [name], DO  Insulin Detemir (LEVEMIR FLEXTOUCH) 100 UNIT/ML Pen Inject 30 Units into the skin at bedtime. Patient taking  differently: Inject 40 Units into the skin at bedtime. 02/10/19   Johnson, Clanford L, MD  lisinopril (ZESTRIL) 2.5 MG tablet Take 1 tablet (2.5 mg total) by mouth daily. 09/29/22 04/07/23  Sharlene Dory, NP  traMADol (ULTRAM) 50 MG tablet Take 1 tablet (50 mg total) by mouth every 6 (six) hours as needed for severe pain (pain score 7-10). 04/12/23   Burgess Amor, PA-C      Allergies    Amoxicillin, Penicillins, Sulfa antibiotics, and Nitrofurantoin    Review of Systems   Review of Systems  Physical Exam Updated Vital Signs BP (!) 149/58   Pulse 60   Temp 98.1 F (36.7 C) (Oral)   Resp 20   Ht 5\' 5"  (1.651 m)   Wt 87.1 kg   SpO2 96%   BMI 31.95 kg/m  Physical Exam  ED Results / Procedures / Treatments   Labs (all labs ordered are listed, but only abnormal results are displayed) Labs Reviewed  BASIC METABOLIC PANEL WITH GFR - Abnormal; Notable for the following components:      Result Value   BUN 37 (*)    Creatinine, Ser 2.20 (*)    Calcium 8.4 (*)    GFR, Estimated 21 (*)    All other components within normal limits  CBC - Abnormal; Notable for the following components:   WBC 10.9 (*)    Hemoglobin 11.1 (*)    HCT 35.4 (*)    All  other components within normal limits  CBG MONITORING, ED - Abnormal; Notable for the following components:   Glucose-Capillary 69 (*)    All other components within normal limits  CBG MONITORING, ED    EKG EKG Interpretation Date/Time:  Tuesday May 03 2023 11:04:01 EDT Ventricular Rate:  69 PR Interval:  178 QRS Duration:  87 QT Interval:  404 QTC Calculation: 433 R Axis:   -57  Text Interpretation: Irregular afib suspected Inferior infarct, old Abnormal lateral Q waves Anterior infarct, old Confirmed by Deatra Face [phone]) on 05/03/2023 12:53:14 PM  Radiology DG Chest Port 1 View Result Date: 05/03/2023 CLINICAL DATA:  5107 Atrial fibrillation (HCC) 5107 EXAM: PORTABLE CHEST 1 VIEW COMPARISON:  04/12/2023. FINDINGS: There are  subtle nonspecific opacities in the right lung lower lobe, which appears less conspicuous than prior 2 exams. Findings may represent residual underlying opacities, which may represent pneumonitis versus atelectasis. There is new/increasing left retrocardiac airspace opacity obscuring the left hemidiaphragm and blunting the left lateral costophrenic angle, suggesting combination of left lung atelectasis and/or consolidation with pleural effusion. Bilateral lung fields are otherwise clear. Right lateral costophrenic angle is clear. Stable cardio-mediastinal silhouette. Redemonstration of prosthetic aortic valve. No acute osseous abnormalities. The soft tissues are within normal limits. IMPRESSION: 1. New/increasing left retrocardiac opacity, as described above. 2. Subtle nonspecific opacities in the right lung lower lobe, which appears less conspicuous than prior 2 exams. Findings may represent residual underlying opacities. Electronically Signed   By: Beula Brunswick M.D.   On: 05/03/2023 11:24    Procedures Procedures  {Document cardiac monitor, telemetry assessment procedure when appropriate:1}  Medications Ordered in ED Medications - No data to display  ED Course/ Medical Decision Making/ A&P   {   Click here for ABCD2, HEART and other calculatorsREFRESH Note before signing :1}                              Medical Decision Making Amount and/or Complexity of Data Reviewed Labs: ordered. Radiology: ordered.   ***  {Document critical care time when appropriate:1} {Document review of labs and clinical decision tools ie heart score, Chads2Vasc2 etc:1}  {Document your independent review of radiology images, and any outside records:1} {Document your discussion with family members, caretakers, and with consultants:1} {Document social determinants of health affecting pt's care:1} {Document your decision making why or why not admission, treatments were needed:1} Final Clinical Impression(s) / ED  Diagnoses Final diagnoses:  Palpitations  PAC (premature atrial contraction)    Rx / DC Orders ED Discharge Orders          Ordered    Ambulatory referral to Cardiology       Comments: If you have not heard from the Cardiology office within the next 72 hours please call [phone].   05/03/23 1609

## 2023-05-03 NOTE — Discharge Instructions (Addendum)
 The workup in the emergency room reveals that you likely have extra heartbeats and not A-fib at this time. Given this uncertainty, you will need remote cardiac monitoring and then follow-up with your cardiologist for further assessment.  Return to the ER if you start having severe chest pain, shortness of breath

## 2023-05-03 NOTE — ED Triage Notes (Signed)
 Pt presents with palpitations that started last night, denies pain or history of a-fib.

## 2023-05-03 NOTE — Telephone Encounter (Signed)
 Copied from CRM [id number]. Topic: Clinical - Medical Advice >> May 02, 2023 10:27 AM Isabell A wrote: Reason for CRM: Patients daughter Edwina Gram is calling to find out when patient was diagnosed with lung cancer.   Callback number: [phone]  Spoke with Edwina Gram patient's daughter regarding prior message . Edwina Gram stated she has contacted Dr.Moody's office on 05/02/2023 and got the information she needed. Lisa's voice was understanding .Nothing else further needed.

## 2023-06-21 ENCOUNTER — Ambulatory Visit (HOSPITAL_COMMUNITY): Attending: Internal Medicine

## 2023-06-23 ENCOUNTER — Encounter: Payer: Self-pay | Admitting: Nurse Practitioner

## 2023-06-23 ENCOUNTER — Ambulatory Visit: Attending: Nurse Practitioner | Admitting: Nurse Practitioner

## 2023-06-23 NOTE — Progress Notes (Deleted)
 Office Visit    Patient Name: Stephanie [name] Date of Encounter: 11/16/2022 PCP:  [facility]  Cardiologist:  Teddie Favre, MD  Advanced Practice Provider:  No care team member to display Electrophysiologist:  None   Chief Complaint and HPI    Stephanie [name] is a 88 y.o. female with a hx of severe aortic stenosis, s/p TAVR in 2022, PAF, COPD, hypertension, mixed hyperlipidemia, chronic kidney disease, hx of lung cancer (receiving tx), and presumed GI bleed in January 21, type 2 diabetes, who presents today for follow-up.  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  Last seen by Dr. Londa Rival on October 14, 2021.  She did admit to sharp sensation of left-sided chest discomfort that prompted ER evaluation prior to office visit.  CT of chest arranged showed an lung nodule on right lower lobe was concerning for neoplasm.  Was suggested to refer patient to [facility] pulmonary for further evaluation.  I saw her for 47-month follow-up on April 12, 2022.  Was doing well. Was receiving SBRT tx for lung cancer. Had one more treatment to go. Denied any chest pain, shortness of breath, palpitations, syncope, presyncope, dizziness, orthopnea, PND, swelling or significant weight changes, acute bleeding, or claudication.   ED visit on September 07, 2022 for evaluation of pleuritic chest pain and shortness of breath.  Denied any chest trauma.  CT of chest, abdomen, and pelvis noted below.  There was nothing acute noted however there was a right lower pulmonary nodule that has substantially enlarged from previous study was now considered a mixed solid and subsolid mass with solid component.  There was no lymphadenopathy or definite signs of metastatic disease outside of the thorax at that time.  CT also revealed evidence of colonic diverticulosis, stable left adrenal nodule felt to be likely benign lesions, hepatic  steatosis.  CXR with opacities in right lower lung, felt to be atypical infection or aspiration.  Was told to follow-up with pulmonology outpatient.  Repeat bronchoscopy and biopsy were arranged.  Underwent robotic assisted bronchoscopy on September 21, 2022.  She was noted to have acute bradycardia post anesthesia.  EKG appeared like ectopic rhythm with PACs, was found to be in sinus rhythm in 60s with PACs on telemetry.  Was not found to be in heart block.  It was suspected that bradycardia was in the setting of anesthesia.  Recommended outpatient echocardiogram and follow-up with cardiology.  It was recommended that if there was no structural heart disease, could reschedule procedure.  Echocardiogram on September 23, 2022 revealed EF 55 to 60%, severe LVH, stable function of aortic valve replacement.  09/29/2022 - Presented for hospital follow-up. Was doing well. Denied any complaints. Denied any chest pain, shortness of breath, syncope, presyncope, dizziness, orthopnea, PND, swelling or significant weight changes, acute bleeding, or claudication. BP averaging 150's at home. Did sometimes sense sensation of "skipping beat," brief and rare per her report. Monitor was arranged - see report below.   11/16/2022-doing well and denies any complaints.  Denies any changes to her health since I last saw her.  She presents her BP log that shows well morning BP readings, elevated evening readings.  Family member states these readings are right after she takes her medicines and not 2 hours afterwards. Denies any chest pain, shortness of breath, palpitations, syncope, presyncope, dizziness, orthopnea, PND, swelling or significant weight changes, acute bleeding, or claudication.  ROS: See HPI. Rest of ROS  negative.   EKGs/Labs/Other Studies Reviewed:   The following studies were reviewed today:   EKG: EKG is not ordered today.   Monitor 09/2022: Predominant rhythm is sinus with prolonged PR interval.  Heart  rate ranged from 41 bpm up to 101 bpm and average heart rate 67 bpm. Frequent PACs were noted representing 13.6% total beats.  There were otherwise occasional atrial couplets and rare atrial triplets. There were rare PVCs including ventricular couplets representing less than 1% total beats.  Also limited ventricular trigeminy. There were 6 episodes of NSVT, the longest of which was 8 beats. Multiple (116) episodes of PSVT were noted.  These were generally very brief, the longest of which was only 14 beats.  There were no sustained arrhythmias. No pauses or high degree heart block.  Echo 09/2022:  1. Left ventricular ejection fraction, by estimation, is 55 to 60%. The  left ventricle has normal function. The left ventricle has no regional  wall motion abnormalities. There is severe left ventricular hypertrophy.  Left ventricular diastolic parameters   are consistent with Grade I diastolic dysfunction (impaired relaxation).  Elevated left atrial pressure.   2. Right ventricular systolic function is normal. The right ventricular  size is normal. Tricuspid regurgitation signal is inadequate for assessing  PA pressure.   3. Left atrial size was mildly dilated.   4. The mitral valve is normal in structure. No evidence of mitral valve  regurgitation. No evidence of mitral stenosis.   5. Edwards Sapien 3 Ultra THV size 26 mm is in the AV position. .The aortic valve has been repaired/replaced. Aortic valve regurgitation is not visualized. No aortic stenosis is present.   6. The inferior vena cava is normal in size with greater than 50%  respiratory variability, suggesting right atrial pressure of 3 mmHg.  Echo 06/2021:  1. Left ventricular ejection fraction, by estimation, is 50 to 55%. Left  ventricular ejection fraction by 2D MOD biplane is 51.9 %. The left  ventricle has low normal function. The left ventricle has no regional wall  motion abnormalities. There is  moderate left ventricular  hypertrophy. Left ventricular diastolic  parameters are consistent with Grade I diastolic dysfunction (impaired  relaxation).   2. Right ventricular systolic function is normal. The right ventricular  size is normal. There is mildly elevated pulmonary artery systolic  pressure. The estimated right ventricular systolic pressure is 38.3 mmHg.   3. Left atrial size was moderately dilated.   4. Right atrial size was moderately dilated.   5. The mitral valve is abnormal. Trivial mitral valve regurgitation.   6. The aortic valve has been repaired/replaced. Aortic valve  regurgitation is not visualized. There is a 26 mm Sapien prosthetic (TAVR)  valve present in the aortic position. Procedure Date: 07/01/2020. Echo  findings are consistent with normal structure  and function of the aortic valve prosthesis. Aortic valve area, by VTI  measures 1.83 cm. Aortic valve mean gradient measures 8.3 mmHg. Aortic  valve Vmax measures 2.10 m/s.   Comparison(s): Changes from prior study are noted. 08/07/2020: LVEF 45-50%,  TAVR - mean gradient 9 mmHg.  Risk Assessment/Calculations:   CHA2DS2-VASc Score = 5  This indicates a 7.2% annual risk of stroke. The patient's score is based upon: CHF History: 0 HTN History: 1 Diabetes History: 1 Stroke History: 0 Vascular Disease History: 0 Age Score: 2 Gender Score: 1   Review of Systems    All other systems reviewed and are otherwise negative except as noted above.  Physical Exam    VS:  There were no vitals taken for this visit. , BMI There is no height or weight on file to calculate BMI.  Wt Readings from Last 3 Encounters:  05/03/23 192 lb (87.1 kg)  04/27/23 197 lb 12.8 oz (89.7 kg)  04/07/23 190 lb (86.2 kg)     GEN: Obese, 88 y.o. female, in no acute distress. HEENT: normal. Neck: Supple, no JVD, carotid bruits, or masses. Cardiac: S1/S2, RRR, Grade 2/6 murmur, no rubs, no gallops. No clubbing, cyanosis, edema.  Radials/PT 2+ and equal  bilaterally.  Respiratory:  Respirations regular and unlabored, clear to auscultation bilaterally. MS: No deformity or atrophy. Skin: Warm and dry, no rash. Neuro:  Strength and sensation are intact. Psych: Normal affect.  Assessment & Plan    Bradycardia, arrhythmia Noted to have up acute bradycardia while undergoing robotic assisted bronchoscopy, was suspected that her bradycardia was in the setting of anesthesia. EKG appeared like ectopic rhythm with PACs, was found to be in sinus rhythm in 60s with PACs on telemetry.  Was not found to be in heart block.  Previous EKG today initially falsely read rhythm as A-fib, reviewed EKG today with Dr. Londa Rival via secure chat and both confirmed EKG shows noted P waves with frequent PACs.  Monitor report noted above without any significant arrhthymias or sustained arrhthymias. Care and ED precautions discussed.   Severe AS, s/p TAVR in 2022 TTE 09/2022 revealed stable aortic valve prosthesis function. Stable and denies any concerning symptoms. Grade 2/6 murmur noted on exam. Continue current meds. SBE prophylaxis discussed and pt verbalized understanding. Heart healthy diet and regular cardiovascular exercise as tolerated encouraged.   PAF Denies any tachycardia or palpitations.  Heart rate 60 today. Sinus rhythm today. Continue current medication regimen. No longer on OAC d/t reported hx of bleeding. Continue carvedilol . Heart healthy diet and regular cardiovascular exercise as tolerated encouraged.   Left ventricular hypertrophy, HTN Recent echo revealed severe LVH in setting of poor blood pressure control. BP well controlled in office today. SBP < 140.  Continue current medication regimen. Given BP log and discussed to monitor BP at home at least 2 hours after medications and sitting for 5-10 minutes. Heart healthy diet encouraged.   HLD Last LDL obtained in 2022 was WNL.  Currently not on any lipid-lowering medications. Will request most recent labs  from PCP. Heart healthy diet and regular cardiovascular exercise as tolerated encouraged.   CKD 3 Stable kidney function seen with most recent lab work.  Avoid nephrotoxic agents.  Encourage adequate hydration.  Continue current medication regimen.  Will obtain BMET as previously mentioned. Continue follow-up with PCP.  7. Pre-op cardiovascular risk assessment Ms. Bergerson's perioperative risk of a major cardiac event is  % according to the Revised Cardiac Risk Index (RCRI).  Therefore, she is at high risk for perioperative complications.   Her functional capacity is fair at   METs according to the Duke Activity Status Index (DASI). Recommendations: According to ACC/AHA guidelines, no further cardiovascular testing needed.  The patient may proceed to surgery at acceptable risk.   Antiplatelet and/or Anticoagulation Recommendations: She is not on any antiplatelet or anticoagulation medication that needs to be held prior to procedure. She will require SBE prophylaxis prior to dental procedure.   Disposition: Follow up in 6 months with Teddie Favre, MD or APP. Plan to address pre-op clearance at next office visit.    Signed, Lasalle Pointer, NP

## 2023-06-26 NOTE — Progress Notes (Deleted)
 Stephanie [name], female    DOB: 1935-05-13   MRN: [id number]   Brief patient profile:  24 yobf  quit smoking 2000 with doe then  referred to pulmonary clinic in [place]  03/31/2021 by MARLA Hummer for spn     History of Present Illness  03/31/2021  Pulmonary/ 1st office eval/ Stephanie [name] / [place] [facility]  Chief Complaint  Patient presents with   Consult    CT done in Feb 2023 showed pulmonary nodules.   Dyspnea:  can still do [employer] but usually uses scooter due to back  Also walks [place] slow pace s doe  Cough: none  Sleep: 2 pillows bed is flat SABA use: neb not even once a month  Rec We will schedule you for PFTs > not done as of 11/13/2021  also contact you in the future for CT @ 1 years from the last.  Call sooner for worse breathing or chest pain when you breathe in or cough that won't go away after a few weeks.   11/13/2021  f/u ov/[place] office/Hinda Lindor re: Nodule f/u maint on no resp rx   Chief Complaint  Patient presents with   Follow-up    Lung nodule seen on CT   Dyspnea: rides scooter for years due to back / walks next door to sister with rollator  Cough: none  Sleeping: 2 pillows/ flat bed  SABA use: has neb, not using  02: none  Covid status: vax / never infected  Rec My office will be contacting you by phone for referral to [facility] office for PFTs and same day visit with Dr Karlis or Byrum  > dx PET pos RLL SPN> SBRT planned       04/07/2022  f/u ov/Stephanie [name] re: GOLD 3 copd  maint on no rx  / SBRT starting 04/08/22  Chief Complaint  Patient presents with   Follow-up    Lung nodule.  Starting chemotherapy tomorrow at [facility].  Patient states wheezing last week.  Seen at [facility]. Does not know rx given.  Dyspnea:  balance is limiting / walks with rollator to sisters house next door  Cough: none  Sleeping: bed is flat 2 pillows or loses breath SABA use: neb prn bad attacks  02: none  Rec Albuterol  2.5 mg with budesonide  0.25 mg twice  daily in nebulizer  routinely (automatically)  and up to every 4 hours if needed Please schedule a follow up visit in 3 months but call sooner if needed in [place] office        04/27/2023  f/u ov/[place] office/Stephanie [name] re: GOLD 3 copd/ f/u spn   maint on prn saba   Chief Complaint  Patient presents with   Follow-up    6 month follow up  pt has no new concerns   Dyspnea:  back at [employer] pushing cart limited more by balance than doe  Cough: none  Sleeping: flat bed 2 pillows s  resp cc  SABA use: once a week neb  02: none   Rec Also  Ok to try albuterol  before 15 min before an activity (on alternating days)  that you know would usually make you short of breath   My office will be contacting you by phone for referral to [facility] for CT chest  > not done as of 06/28/2023    % satisfied,  please tell us .  If 100% better, tell your friends!  Pulmonary follow up is as needed  06/28/2023  f/u ov/Stephanie [name] re: ***   maint on ***  No chief complaint on file.   Dyspnea:  *** Cough: *** Sleeping: *** resp cc  SABA use: *** 02: ***  Lung cancer screening :  ***    No obvious day to day or daytime variability or assoc excess/ purulent sputum or mucus plugs or hemoptysis or cp or chest tightness, subjective wheeze or overt sinus or hb symptoms.    Also denies any obvious fluctuation of symptoms with weather or environmental changes or other aggravating or alleviating factors except as outlined above   No unusual exposure hx or h/o childhood pna/ asthma or knowledge of premature birth.  Current Allergies, Complete Past Medical History, Past Surgical History, Family History, and Social History were reviewed in [employer] record.  ROS  The following are not active complaints unless bolded Hoarseness, sore throat, dysphagia, dental problems, itching, sneezing,  nasal congestion or discharge of excess mucus or purulent secretions, ear ache,   fever, chills,  sweats, unintended wt loss or wt gain, classically pleuritic or exertional cp,  orthopnea pnd or arm/hand swelling  or leg swelling, presyncope, palpitations, abdominal pain, anorexia, nausea, vomiting, diarrhea  or change in bowel habits or change in bladder habits, change in stools or change in urine, dysuria, hematuria,  rash, arthralgias, visual complaints, headache, numbness, weakness or ataxia or problems with walking or coordination,  change in mood or  memory.        No outpatient medications have been marked as taking for the 06/28/23 encounter (Appointment) with Annisha Baar B, MD.         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 Wonda and Dr. Lucas.   Severe aortic stenosis    Symptomatic anemia    Presumed GI bleed January 2021 - she declined GI work-up   Type 2 diabetes mellitus (HCC)         Objective:    Wts  06/28/2023        ***  04/27/2023          197  09/30/2022       225   04/07/2022       189  11/13/21 181 lb 3.2 oz (82.2 kg)  10/14/21 183 lb 6.4 oz (83.2 kg)  10/13/21 225 lb (102.1 kg)    Vital signs reviewed  06/28/2023  - Note at rest 02 sats  ***% on ***   General appearance:    ***     Min barr  2-3/6 sem ***            Assessment

## 2023-06-28 ENCOUNTER — Ambulatory Visit: Admitting: Internal Medicine

## 2023-06-28 ENCOUNTER — Encounter: Payer: Self-pay | Admitting: Internal Medicine

## 2023-06-28 DIAGNOSIS — I480 Paroxysmal atrial fibrillation: Secondary | ICD-10-CM | POA: Diagnosis not present

## 2023-06-29 ENCOUNTER — Ambulatory Visit: Payer: Self-pay | Admitting: Internal Medicine

## 2023-07-06 ENCOUNTER — Other Ambulatory Visit: Payer: Self-pay | Admitting: Cardiology

## 2023-07-15 ENCOUNTER — Encounter: Payer: Self-pay | Admitting: Emergency Medicine

## 2023-07-28 ENCOUNTER — Emergency Department (HOSPITAL_COMMUNITY)

## 2023-07-28 ENCOUNTER — Emergency Department (HOSPITAL_COMMUNITY)
Admission: EM | Admit: 2023-07-28 | Discharge: 2023-07-28 | Disposition: A | Discharge status: Home / Self Care | Attending: Emergency Medicine | Admitting: Emergency Medicine

## 2023-07-28 ENCOUNTER — Telehealth: Payer: Self-pay | Admitting: Pulmonary Disease

## 2023-07-28 DIAGNOSIS — Z794 Long term (current) use of insulin: Secondary | ICD-10-CM | POA: Diagnosis not present

## 2023-07-28 DIAGNOSIS — R059 Cough, unspecified: Secondary | ICD-10-CM | POA: Diagnosis not present

## 2023-07-28 DIAGNOSIS — N189 Chronic kidney disease, unspecified: Secondary | ICD-10-CM | POA: Insufficient documentation

## 2023-07-28 DIAGNOSIS — I129 Hypertensive chronic kidney disease with stage 1 through stage 4 chronic kidney disease, or unspecified chronic kidney disease: Secondary | ICD-10-CM | POA: Insufficient documentation

## 2023-07-28 DIAGNOSIS — R918 Other nonspecific abnormal finding of lung field: Secondary | ICD-10-CM | POA: Diagnosis present

## 2023-07-28 DIAGNOSIS — Z79899 Other long term (current) drug therapy: Secondary | ICD-10-CM | POA: Diagnosis not present

## 2023-07-28 DIAGNOSIS — R0602 Shortness of breath: Secondary | ICD-10-CM | POA: Insufficient documentation

## 2023-07-28 DIAGNOSIS — E119 Type 2 diabetes mellitus without complications: Secondary | ICD-10-CM | POA: Diagnosis not present

## 2023-07-28 DIAGNOSIS — J4489 Other specified chronic obstructive pulmonary disease: Secondary | ICD-10-CM | POA: Diagnosis not present

## 2023-07-28 LAB — BASIC METABOLIC PANEL WITH GFR
Anion gap: 10 (ref 5–15)
BUN: 17 mg/dL (ref 8–23)
CO2: 27 mmol/L (ref 22–32)
Calcium: 8.1 mg/dL — ABNORMAL LOW (ref 8.9–10.3)
Chloride: 99 mmol/L (ref 98–111)
Creatinine, Ser: 1.51 mg/dL — ABNORMAL HIGH (ref 0.44–1.00)
GFR, Estimated: 34 mL/min — ABNORMAL LOW (ref >60)
Glucose, Bld: 144 mg/dL — ABNORMAL HIGH (ref 70–99)
Potassium: 4.8 mmol/L (ref 3.5–5.1)
Sodium: 136 mmol/L (ref 135–145)

## 2023-07-28 LAB — CBC
HCT: 33.4 % — ABNORMAL LOW (ref 36.0–46.0)
Hemoglobin: 10.6 g/dL — ABNORMAL LOW (ref 12.0–15.0)
MCH: 28.8 pg (ref 26.0–34.0)
MCHC: 31.7 g/dL (ref 30.0–36.0)
MCV: 90.8 fL (ref 80.0–100.0)
Platelets: 187 K/uL (ref 150–400)
RBC: 3.68 MIL/uL — ABNORMAL LOW (ref 3.87–5.11)
RDW: 14.8 % (ref 11.5–15.5)
WBC: 9.7 K/uL (ref 4.0–10.5)
nRBC: 0 % (ref 0.0–0.2)

## 2023-07-28 LAB — SARS CORONAVIRUS 2 BY RT PCR: SARS Coronavirus 2 by RT PCR: NEGATIVE

## 2023-07-28 LAB — HEPATIC FUNCTION PANEL
ALT: 9 U/L (ref 0–44)
AST: 14 U/L — ABNORMAL LOW (ref 15–41)
Albumin: 3.4 g/dL — ABNORMAL LOW (ref 3.5–5.0)
Alkaline Phosphatase: 125 U/L (ref 38–126)
Bilirubin, Direct: 0.1 mg/dL (ref 0.0–0.2)
Indirect Bilirubin: 0.6 mg/dL (ref 0.3–0.9)
Total Bilirubin: 0.7 mg/dL (ref 0.0–1.2)
Total Protein: 6.7 g/dL (ref 6.5–8.1)

## 2023-07-28 LAB — LACTIC ACID, PLASMA: Lactic Acid, Venous: 0.9 mmol/L (ref 0.5–1.9)

## 2023-07-28 LAB — BRAIN NATRIURETIC PEPTIDE: B Natriuretic Peptide: 400 pg/mL — ABNORMAL HIGH (ref 0.0–100.0)

## 2023-07-28 LAB — TROPONIN I (HIGH SENSITIVITY): Troponin I (High Sensitivity): 14 ng/L (ref <18)

## 2023-07-28 MED ORDER — IOHEXOL 350 MG/ML SOLN
60.0000 mL | Freq: Once | INTRAVENOUS | Status: AC | PRN
  Administered 2023-07-28: 60 mL via INTRAVENOUS

## 2023-07-28 MED ORDER — SODIUM CHLORIDE 0.9 % IV BOLUS
500.0000 mL | Freq: Once | INTRAVENOUS | Status: AC
  Administered 2023-07-28: 500 mL via INTRAVENOUS

## 2023-07-28 MED ORDER — HYDRALAZINE HCL 20 MG/ML IJ SOLN
5.0000 mg | Freq: Once | INTRAMUSCULAR | Status: AC
  Administered 2023-07-28: 5 mg via INTRAVENOUS
  Filled 2023-07-28: qty 1

## 2023-07-28 NOTE — ED Notes (Signed)
 Patient transported to CT

## 2023-07-28 NOTE — Telephone Encounter (Signed)
 ATC pt x1. Pts granddaughter answered the phone and is not on DPR. Advised I would call back tomorrow as pt is unavailable.

## 2023-07-28 NOTE — ED Triage Notes (Signed)
 Pt arrives via EMS from home with c/o [facility] starting last night around 1900. Pt reports taking 2 albuterol  tx at home with no relief. 1 duoneb give en route. Pt denies SHOB or CP at this time.

## 2023-07-28 NOTE — ED Notes (Signed)
 Tammy, PA made aware of BP 206/96 upon DC. EDP made aware of eagerness to be discharged. See new orders, med given and pt stable for DC at this time.

## 2023-07-28 NOTE — ED Provider Notes (Signed)
 [place] EMERGENCY DEPARTMENT AT [facility] Provider Note   CSN: [id number] Arrival date & time: 07/28/23  1133     Patient presents with: Shortness of Breath   Stephanie [name] is a 88 y.o. female.    Shortness of Breath Associated symptoms: chest pain and cough   Associated symptoms: no abdominal pain, no fever, no sore throat and no vomiting         Stephanie [name] is a 88 y.o. female past medical history of asthma, type 2 diabetes, hypertension, aortic valve replacement, COPD and CKD who comes from home, brought in by EMS for evaluation of chest pain and shortness of breath.  Began having shortness of breath last evening around 7 PM.  Has history of asthma, she took 2 albuterol  breathing treatments at home without improvement.  Shortly after onset of her shortness of breath she began having some centralized chest pain that radiates around her right lateral chest wall and under her right breast.  Endorses some nonproductive cough as well.  Symptoms returned earlier today and EMS was called.  She got 2 albuterol  treatments and route here and currently denies any chest pain or shortness of breath.  No reported fever or chills at home.  Denies any peripheral edema.  Does not use supplemental oxygen , dos not take blood thinner   Prior to Admission medications   Medication Sig Start Date End Date Taking? Authorizing Provider  albuterol  (PROVENTIL ) (2.5 MG/3ML) 0.083% nebulizer solution USE ONE AMPULE VIA NEBULIZER EVERY 4 HOURS AS NEEDED FOR SHORTNESS OF BREATH OR WHEEZING 01/07/23   Darlean Ozell NOVAK, MD  BD PEN NEEDLE NANO U/F 32G X 4 MM MISC See admin instructions. 03/30/22   [provider]  Blood Pressure Monitor DEVI 1 each by Does not apply route daily. 09/29/22   Miriam Norris, NP  carvedilol  (COREG ) 6.25 MG tablet TAKE ONE TABLET BY MOUTH TWICE DAILY 04/14/23   Miriam Norris, NP  cinacalcet (SENSIPAR) 30 MG tablet Take 30 mg by mouth daily. 03/31/23    [provider]  FEROSUL 325 (65 Fe) MG tablet Take 325 mg by mouth daily.    [provider]  furosemide  (LASIX ) 40 MG tablet TAKE ONE TABLET BY MOUTH EVERY DAY 07/07/23   Miriam Norris, NP  gabapentin  (NEURONTIN ) 100 MG capsule Take 100-200 mg by mouth at bedtime. 10/02/19   [provider]  GNP VITAMIN D3 EXTRA STRENGTH 25 MCG (1000 UT) tablet Take 1,000 Units by mouth daily. 03/31/23   [provider]  hydrALAZINE  (APRESOLINE ) 50 MG tablet Take 50 mg by mouth 2 (two) times daily. 03/31/23   [provider]  HYDROcodone -acetaminophen  (NORCO/VICODIN) 5-325 MG tablet Take 1 tablet by mouth every 6 (six) hours as needed for moderate pain (pain score 4-6) or severe pain (pain score 7-10). 04/08/23   Arlon Carliss ORN, DO  Insulin  Detemir (LEVEMIR  FLEXTOUCH) 100 UNIT/ML Pen Inject 30 Units into the skin at bedtime. Patient taking differently: Inject 40 Units into the skin at bedtime. 02/10/19   Johnson, Clanford L, MD  lisinopril  (ZESTRIL ) 2.5 MG tablet Take 1 tablet (2.5 mg total) by mouth daily. 09/29/22 04/07/23  Miriam Norris, NP  traMADol  (ULTRAM ) 50 MG tablet Take 1 tablet (50 mg total) by mouth every 6 (six) hours as needed for severe pain (pain score 7-10). 04/12/23   Idol, Julie, PA-C    Allergies: Amoxicillin, Penicillins, Sulfa antibiotics, and Nitrofurantoin    Review of Systems  Constitutional:  Negative for  appetite change, chills and fever.  HENT:  Negative for congestion, sore throat and trouble swallowing.   Respiratory:  Positive for cough and shortness of breath.   Cardiovascular:  Positive for chest pain.  Gastrointestinal:  Negative for abdominal pain, diarrhea, nausea and vomiting.  Genitourinary:  Negative for dysuria.  Musculoskeletal:  Negative for arthralgias.  Neurological:  Negative for dizziness, weakness and numbness.    Updated Vital Signs BP (!) 183/65   Pulse (!) 56   Temp 98.4 F (36.9 C) (Oral)   Resp (!) 26    SpO2 92%   Physical Exam Vitals and nursing note reviewed.  Constitutional:      General: She is not in acute distress.    Appearance: She is well-developed. She is not toxic-appearing.  HENT:     Mouth/Throat:     Mouth: Mucous membranes are moist.     Pharynx: Oropharynx is clear. No posterior oropharyngeal erythema.  Cardiovascular:     Rate and Rhythm: Normal rate and regular rhythm.     Pulses: Normal pulses.  Pulmonary:     Effort: Pulmonary effort is normal. No respiratory distress.     Breath sounds: Normal breath sounds. No wheezing.     Comments: No increased work of breathing on my exam.  No rales or wheezing at this time. Chest:     Chest wall: No tenderness.  Abdominal:     Palpations: Abdomen is soft.     Tenderness: There is no abdominal tenderness. There is no right CVA tenderness or left CVA tenderness.  Musculoskeletal:        General: Normal range of motion.     Right lower leg: No edema.     Left lower leg: No edema.  Skin:    General: Skin is warm.     Capillary Refill: Capillary refill takes less than 2 seconds.  Neurological:     General: No focal deficit present.     Mental Status: She is alert.     Sensory: No sensory deficit.     Motor: No weakness.     (all labs ordered are listed, but only abnormal results are displayed) Labs Reviewed  BASIC METABOLIC PANEL WITH GFR - Abnormal; Notable for the following components:      Result Value   Glucose, Bld 144 (*)    Creatinine, Ser 1.51 (*)    Calcium  8.1 (*)    GFR, Estimated 34 (*)    All other components within normal limits  CBC - Abnormal; Notable for the following components:   RBC 3.68 (*)    Hemoglobin 10.6 (*)    HCT 33.4 (*)    All other components within normal limits  HEPATIC FUNCTION PANEL - Abnormal; Notable for the following components:   Albumin 3.4 (*)    AST 14 (*)    All other components within normal limits  BRAIN NATRIURETIC PEPTIDE - Abnormal; Notable for the following  components:   B Natriuretic Peptide 400.0 (*)    All other components within normal limits  SARS CORONAVIRUS 2 BY RT PCR  LACTIC ACID, PLASMA  LACTIC ACID, PLASMA  TROPONIN I (HIGH SENSITIVITY)  TROPONIN I (HIGH SENSITIVITY)    EKG: None  Radiology: CT Angio Chest PE W and/or Wo Contrast Result Date: 07/28/2023 CLINICAL DATA:  Concern for pulmonary edema.  Shortness of breath. EXAM: CT ANGIOGRAPHY CHEST WITH CONTRAST TECHNIQUE: Multidetector CT imaging of the chest was performed using the standard protocol during bolus administration of intravenous  contrast. Multiplanar CT image reconstructions and MIPs were obtained to evaluate the vascular anatomy. RADIATION DOSE REDUCTION: This exam was performed according to the departmental dose-optimization program which includes automated exposure control, adjustment of the mA and/or kV according to patient size and/or use of iterative reconstruction technique. CONTRAST:  60mL OMNIPAQUE  IOHEXOL  350 MG/ML SOLN COMPARISON:  CT dated 09/07/2022. FINDINGS: Cardiovascular: Borderline cardiomegaly. Trace pericardial effusion. There is 3 vessel coronary vascular calcification. Aortic valve repair. Mild atherosclerotic calcification of the thoracic aorta. There is no dilatation or dissection. The origins of the great vessels of the aortic arch are patent. Small nonocclusive peripheral filling defect in the right lower lobe pulmonary artery branch (68/5) may be artifactual or represent an old embolus/scarring. A small acute PE is less likely but not excluded. Mediastinum/Nodes: No hilar or mediastinal adenopathy. The esophagus is grossly unremarkable. No mediastinal fluid collection. Lungs/Pleura: Areas of atelectasis/scarring at the left lung base. There is a 3.8 x 2.7 cm mass in the right lower lobe along the major fissure extending into the right middle lobe. This mass has increased in size since the prior CT with increase in the solid component. Prior biopsy  fiducial markers noted. Trace right pleural effusion. No pneumothorax. The central airways are patent. Upper Abdomen: No acute abnormality. Musculoskeletal: Osteopenia with degenerative changes of the spine. There is nodular thickening of the anterolateral right chest wall at the level of the pulmonary nodule with old or subacute fractures of the fifth and sixth ribs. This may be postsurgical changes. However, metastatic disease and pathologic fracture is not excluded. Review of the MIP images confirms the above findings. IMPRESSION: 1. Artifact versus a small nonocclusive chronic clot/scarring in the right lower lobe pulmonary artery branch. A small acute PE is less likely. 2. Interval increase in size of the right lower lobe mass extending ac the major fissure fissure into the right middle lobe. 3. Trace right pleural effusion. 4. Postsurgical changes of the right chest wall. Metastatic disease and pathologic fracture is not excluded. 5.  Aortic Atherosclerosis (ICD10-I70.0). Electronically Signed   By: Vanetta Chou M.D.   On: 07/28/2023 14:38   DG Chest Port 1 View Result Date: 07/28/2023 CLINICAL DATA:  Shortness of breath. EXAM: PORTABLE CHEST - 1 VIEW COMPARISON:  05/03/2023 FINDINGS: Heart is mildly enlarged.  No pulmonary vascular congestion. Interval development of bibasilar airspace opacities. Prosthetic aortic valve again seen. The advanced degenerative changes of the shoulders. IMPRESSION: 1. Unchanged mild cardiomegaly. 2. Interval development of bibasilar airspace opacities which may be due to atelectasis, pneumonia, or pulmonary edema. No Electronically Signed   By: Aliene Lloyd M.D.   On: 07/28/2023 12:39     Procedures   Medications Ordered in the ED  sodium chloride  0.9 % bolus 500 mL (has no administration in time range)                                    Medical Decision Making Patient here for evaluation of chest pain and cough with shortness of breath.  Denies any fever or  chills.  Has used her albuterol  nebulizer at home without relief.  Chest pain began yesterday.  She is also having some pain of her right lateral chest wall with coughing.  Differential would include but not limited to pneumonia, bronchitis, PE, ACS  On review of medical records, patient has known pulmonary nodules, underwent bronchoscopy.  There was noted to be  increase in size of mass to right lung.  This was seen in August of last year.  In September, it appears that patient was undergoing repeat bronchoscopy when became bradycardic and procedure was discontinued.  Amount and/or Complexity of Data Reviewed Labs: ordered.    Details: No leukocytosis, chemistries serum creatinine elevated but improved from previous  BNP mildly elevated at 400, COVID test negative, Radiology: ordered.    Details: Chest x-ray shows mild cardiomegaly that is unchanged and interval development of bibasilar airspace opacities which could represent atelectasis pneumonia or pulmonary edema  CT angio of the chest ordered for further evaluation, shows artifact versus small nonocclusive chronic clot in the right lower lobe pulmonary artery branch.  Interval increase in size of right lower lobe mass, A small acute PE is less likely, trace right pleural effusion. ECG/medicine tests: ordered.    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 Jude.  Review of previous notes, lung mass has not significantly changed in size since August of last year.  She was scheduled to undergo repeat bronchoscopy in September of last year but procedure was aborted apparently due to bradycardia.  Procedure has not been rescheduled yet per patient    Since patient is feeling better and her vital signs are without tachycardia tachypnea or hypoxia, and she is  requesting discharge home, is felt that she can follow-up outpatient in their office.  Has upcoming appointment in August but Dr. Jude to expedite.    I have given ER return precautions  Risk Prescription drug management.        Final diagnoses:  Lung mass    ED Discharge Orders     None          Herlinda Milling, PA-C 07/29/23 1422    Suzette Pac, MD 07/30/23 [phone]

## 2023-07-28 NOTE — Telephone Encounter (Signed)
 [facility] calling to notify about RLL mass note on CT angio Of note she had a similar size mass noted 08/2022, h/o SBRT feb 2024 & mass grew in size afer Dr Brenna had robotic bronch scheduled but she developed intra op brady/hypotension & procedure aborted. She has FU appt with MW on 8/15 when this can be discussed  Please also make appt with nodule clinic

## 2023-07-28 NOTE — Telephone Encounter (Signed)
 Let's set her up my next nodule slot. Thanks.

## 2023-07-28 NOTE — ED Notes (Signed)
 Pt requesting to leave after BP med administration. EDP aware, pt stable for DC.

## 2023-07-28 NOTE — Discharge Instructions (Signed)
 Call Dr. Chari office on Monday if you do not hear from his office before then.  You may continue your albuterol  nebulizer treatments as directed.  Return to the emergency department if you develop any new or worsening symptoms.

## 2023-07-29 NOTE — Telephone Encounter (Signed)
 Copied from CRM [id number]. Topic: Appointments - Appointment Info/Confirmation >> Jul 29, 2023  9:00 AM Leila C wrote: Patient/patient representative is calling for information regarding an appointment.  Patient's child Olam [phone] is asking if patient can be seen sooner than September with Dr. Darlean. Informed Olam, patient has an appointment for 09/01/23 at 2:45 pm with Dr. Darlean. Placed patient on wait list and Olam asked to call [phone] for any cancellation, noted in wait list notes. Olam verbalized understanding and denies any other concerns. >> Jul 29, 2023  9:16 AM Leila C wrote: Patient's child Olam [phone] states was confused because she thought the sooner appointment was for Dr. Darlean in [place], but this appointment is for a provider in [place]. Olam states patient went to the [facility] ER yesterday and was advised the nodule needs to be removed as soon as possible before September appointment with Dr. Shelah in [place]. Per CAL, send Dr. Lanny nurse to see if the patient can be seen sooner.    I called and spoke with Heron. Offered appt to see Dr. Shelah to discuss nodule. Pt declined ov with RB and would like to keep ov 8/14 with Dr. Darlean in [place] as location is closer to her. Nothing further needed

## 2023-07-29 NOTE — Telephone Encounter (Signed)
 Spoke to MS. [place]. She Ok'd I speak to granddaughter. I made appt and sent letter. NFN

## 2023-08-12 ENCOUNTER — Other Ambulatory Visit: Payer: Self-pay

## 2023-08-12 ENCOUNTER — Emergency Department (HOSPITAL_COMMUNITY)

## 2023-08-12 ENCOUNTER — Encounter (HOSPITAL_COMMUNITY): Payer: Self-pay | Admitting: *Deleted

## 2023-08-12 ENCOUNTER — Emergency Department (HOSPITAL_COMMUNITY)
Admission: EM | Admit: 2023-08-12 | Discharge: 2023-08-12 | Disposition: A | Discharge status: Home / Self Care | Attending: Emergency Medicine | Admitting: Emergency Medicine

## 2023-08-12 DIAGNOSIS — E1122 Type 2 diabetes mellitus with diabetic chronic kidney disease: Secondary | ICD-10-CM | POA: Diagnosis present

## 2023-08-12 DIAGNOSIS — R918 Other nonspecific abnormal finding of lung field: Secondary | ICD-10-CM

## 2023-08-12 DIAGNOSIS — Z7984 Long term (current) use of oral hypoglycemic drugs: Secondary | ICD-10-CM | POA: Diagnosis not present

## 2023-08-12 DIAGNOSIS — Z952 Presence of prosthetic heart valve: Secondary | ICD-10-CM | POA: Diagnosis not present

## 2023-08-12 DIAGNOSIS — I129 Hypertensive chronic kidney disease with stage 1 through stage 4 chronic kidney disease, or unspecified chronic kidney disease: Secondary | ICD-10-CM | POA: Insufficient documentation

## 2023-08-12 DIAGNOSIS — E119 Type 2 diabetes mellitus without complications: Secondary | ICD-10-CM

## 2023-08-12 DIAGNOSIS — Z79899 Other long term (current) drug therapy: Secondary | ICD-10-CM | POA: Insufficient documentation

## 2023-08-12 DIAGNOSIS — R0600 Dyspnea, unspecified: Secondary | ICD-10-CM | POA: Diagnosis present

## 2023-08-12 DIAGNOSIS — N179 Acute kidney failure, unspecified: Secondary | ICD-10-CM | POA: Diagnosis present

## 2023-08-12 DIAGNOSIS — E1165 Type 2 diabetes mellitus with hyperglycemia: Secondary | ICD-10-CM | POA: Diagnosis present

## 2023-08-12 DIAGNOSIS — Z87891 Personal history of nicotine dependence: Secondary | ICD-10-CM | POA: Diagnosis not present

## 2023-08-12 DIAGNOSIS — I10 Essential (primary) hypertension: Secondary | ICD-10-CM | POA: Diagnosis not present

## 2023-08-12 DIAGNOSIS — N1832 Chronic kidney disease, stage 3b: Secondary | ICD-10-CM | POA: Diagnosis not present

## 2023-08-12 DIAGNOSIS — J441 Chronic obstructive pulmonary disease with (acute) exacerbation: Secondary | ICD-10-CM | POA: Diagnosis not present

## 2023-08-12 DIAGNOSIS — J90 Pleural effusion, not elsewhere classified: Secondary | ICD-10-CM | POA: Insufficient documentation

## 2023-08-12 DIAGNOSIS — R0902 Hypoxemia: Secondary | ICD-10-CM

## 2023-08-12 DIAGNOSIS — N184 Chronic kidney disease, stage 4 (severe): Secondary | ICD-10-CM | POA: Diagnosis present

## 2023-08-12 DIAGNOSIS — E1169 Type 2 diabetes mellitus with other specified complication: Secondary | ICD-10-CM | POA: Diagnosis present

## 2023-08-12 LAB — RESP PANEL BY RT-PCR (RSV, FLU A&B, COVID)  RVPGX2
Influenza A by PCR: NEGATIVE
Influenza B by PCR: NEGATIVE
Resp Syncytial Virus by PCR: NEGATIVE
SARS Coronavirus 2 by RT PCR: NEGATIVE

## 2023-08-12 LAB — CBC WITH DIFFERENTIAL/PLATELET
Abs Immature Granulocytes: 0.05 K/uL (ref 0.00–0.07)
Basophils Absolute: 0 K/uL (ref 0.0–0.1)
Basophils Relative: 0 %
Eosinophils Absolute: 0.2 K/uL (ref 0.0–0.5)
Eosinophils Relative: 2 %
HCT: 38.3 % (ref 36.0–46.0)
Hemoglobin: 11.9 g/dL — ABNORMAL LOW (ref 12.0–15.0)
Immature Granulocytes: 1 %
Lymphocytes Relative: 8 %
Lymphs Abs: 0.9 K/uL (ref 0.7–4.0)
MCH: 28.4 pg (ref 26.0–34.0)
MCHC: 31.1 g/dL (ref 30.0–36.0)
MCV: 91.4 fL (ref 80.0–100.0)
Monocytes Absolute: 0.8 K/uL (ref 0.1–1.0)
Monocytes Relative: 7 %
Neutro Abs: 8.9 K/uL — ABNORMAL HIGH (ref 1.7–7.7)
Neutrophils Relative %: 82 %
Platelets: 217 K/uL (ref 150–400)
RBC: 4.19 MIL/uL (ref 3.87–5.11)
RDW: 14.9 % (ref 11.5–15.5)
WBC: 10.8 K/uL — ABNORMAL HIGH (ref 4.0–10.5)
nRBC: 0 % (ref 0.0–0.2)

## 2023-08-12 LAB — BASIC METABOLIC PANEL WITH GFR
Anion gap: 12 (ref 5–15)
BUN: 21 mg/dL (ref 8–23)
CO2: 26 mmol/L (ref 22–32)
Calcium: 8.2 mg/dL — ABNORMAL LOW (ref 8.9–10.3)
Chloride: 102 mmol/L (ref 98–111)
Creatinine, Ser: 1.51 mg/dL — ABNORMAL HIGH (ref 0.44–1.00)
GFR, Estimated: 34 mL/min — ABNORMAL LOW (ref >60)
Glucose, Bld: 154 mg/dL — ABNORMAL HIGH (ref 70–99)
Potassium: 4.7 mmol/L (ref 3.5–5.1)
Sodium: 140 mmol/L (ref 135–145)

## 2023-08-12 LAB — BRAIN NATRIURETIC PEPTIDE: B Natriuretic Peptide: 363 pg/mL — ABNORMAL HIGH (ref 0.0–100.0)

## 2023-08-12 MED ORDER — IPRATROPIUM-ALBUTEROL 0.5-2.5 (3) MG/3ML IN SOLN
3.0000 mL | Freq: Once | RESPIRATORY_TRACT | Status: AC
  Administered 2023-08-12: 3 mL via RESPIRATORY_TRACT
  Filled 2023-08-12: qty 3

## 2023-08-12 MED ORDER — LEVOFLOXACIN IN D5W 750 MG/150ML IV SOLN
750.0000 mg | Freq: Once | INTRAVENOUS | Status: AC
  Administered 2023-08-12: 750 mg via INTRAVENOUS
  Filled 2023-08-12: qty 150

## 2023-08-12 MED ORDER — IOHEXOL 350 MG/ML SOLN
100.0000 mL | Freq: Once | INTRAVENOUS | Status: AC | PRN
Start: 2023-08-12 — End: 2023-08-12
  Administered 2023-08-12: 100 mL via INTRAVENOUS

## 2023-08-12 MED ORDER — AMLODIPINE BESYLATE 5 MG PO TABS
5.0000 mg | ORAL_TABLET | Freq: Once | ORAL | Status: AC
  Administered 2023-08-12: 5 mg via ORAL
  Filled 2023-08-12: qty 1

## 2023-08-12 MED ORDER — GUAIFENESIN ER 600 MG PO TB12: 600.0000 mg | ORAL_TABLET | Freq: Two times a day (BID) | ORAL | 2 refills | Status: DC

## 2023-08-12 MED ORDER — DOXYCYCLINE HYCLATE 100 MG PO CAPS: 100.0000 mg | ORAL_CAPSULE | Freq: Two times a day (BID) | ORAL | 0 refills | Status: DC

## 2023-08-12 MED ORDER — HYDRALAZINE HCL 50 MG PO TABS: 50.0000 mg | ORAL_TABLET | Freq: Three times a day (TID) | ORAL | 5 refills | Status: DC

## 2023-08-12 MED ORDER — METHYLPREDNISOLONE SODIUM SUCC 125 MG IJ SOLR
125.0000 mg | Freq: Once | INTRAMUSCULAR | Status: AC
  Administered 2023-08-12: 125 mg via INTRAVENOUS
  Filled 2023-08-12: qty 2

## 2023-08-12 MED ORDER — GUAIFENESIN-DM 100-10 MG/5ML PO SYRP: 5.0000 mL | ORAL_SOLUTION | ORAL | 0 refills | Status: DC | PRN

## 2023-08-12 MED ORDER — CARVEDILOL 6.25 MG PO TABS: 6.2500 mg | ORAL_TABLET | Freq: Two times a day (BID) | ORAL | 3 refills | Status: DC

## 2023-08-12 MED ORDER — IPRATROPIUM-ALBUTEROL 0.5-2.5 (3) MG/3ML IN SOLN
3.0000 mL | RESPIRATORY_TRACT | Status: AC
  Administered 2023-08-12 (×2): 3 mL via RESPIRATORY_TRACT
  Filled 2023-08-12: qty 6

## 2023-08-12 MED ORDER — PREDNISONE 20 MG PO TABS: 40.0000 mg | ORAL_TABLET | Freq: Every day | ORAL | 0 refills | Status: DC

## 2023-08-12 MED ORDER — ISOSORBIDE MONONITRATE ER 30 MG PO TB24: 30.0000 mg | ORAL_TABLET | Freq: Every day | ORAL | 11 refills | Status: DC

## 2023-08-12 MED ORDER — ALBUTEROL SULFATE (2.5 MG/3ML) 0.083% IN NEBU: 2.5000 mg | INHALATION_SOLUTION | RESPIRATORY_TRACT | 3 refills | Status: DC | PRN

## 2023-08-12 MED ORDER — PREDNISONE 20 MG PO TABS: 40.0000 mg | ORAL_TABLET | Freq: Every day | ORAL | 0 refills | Status: AC

## 2023-08-12 MED ORDER — BREO ELLIPTA 200-25 MCG/ACT IN AEPB
1.0000 | INHALATION_SPRAY | Freq: Every day | RESPIRATORY_TRACT | 5 refills | Status: DC
Start: 2023-08-12 — End: 2023-09-01

## 2023-08-12 MED ORDER — LEVOFLOXACIN 750 MG PO TABS: 750.0000 mg | ORAL_TABLET | Freq: Every day | ORAL | 0 refills | Status: AC

## 2023-08-12 NOTE — ED Triage Notes (Signed)
 Pt in from home via [facility], pt reports ongoing intermittent SOB through the night, with asthma hx, pt reported to use her albuterol  inhaler without relief, pts dx of lung cancer R lower lung and has not started treatment, upon EMS arrival O2 sats 82% on RA, pt rcvd x2 duoneb pta with O2 sats to 98%-99%, pt placed on O2 for transport, pt on RA at 90% upon arrival to ED, pt placed on 2 L [place] in ED, A&O x4

## 2023-08-12 NOTE — ED Notes (Signed)
 Pt states, I take the shot for  my diabetes, but haven't taken it in about 2 months. The doctor never prescribed it again.

## 2023-08-12 NOTE — ED Notes (Signed)
 Ambulated Pt in hallway, stats stayed at 95%

## 2023-08-12 NOTE — Discharge Instructions (Addendum)
 1)Very Low-salt diet advised---Less than 2 gm of Sodium per day advised----ok to use Mrs DASH salt substitute instead of Salt 2)Weigh yourself daily, call if you gain more than 3 pounds in 1 day or more than 5 pounds in 1 week as your diuretic medications may need to be adjusted 3) please keep your appointment for previously scheduled lung biopsy 4)--please note that there has been some changes to medications, please take medications as prescribed 5)Follow-up with primary care physician in 3 to 4 days for reevaluation and recheck

## 2023-08-12 NOTE — ED Provider Notes (Signed)
    ED Course / MDM   Clinical Course as of 08/12/23 1716  Fri Aug 12, 2023  1313 B Natriuretic Peptide(!): 363.0 Echo 9/24 with LVEF 55 to 60%, G1 DD   [SG]  1534 Received sign out from Dr. Elnor pending ambulation trial  [WS]  1715 Patient still borderline on room air.  Believe patient would benefit from admission does have possible pneumonia on CT scan as well as slight elevated BNP and pleural effusion.  Discussed with hospitali Dr. Pearlean  who will come evaluate the patient [WS]    Clinical Course User Index [SG] Elnor Jayson LABOR, DO [WS] Francesca Elsie CROME, MD   Medical Decision Making Amount and/or Complexity of Data Reviewed Labs: ordered. Decision-making details documented in ED Course. Radiology: ordered.  Risk Prescription drug management. Decision regarding hospitalization.         Francesca Elsie CROME, MD 08/12/23 [phone]

## 2023-08-12 NOTE — ED Provider Notes (Signed)
 [place] EMERGENCY DEPARTMENT AT [facility] Provider Note  CSN: [id number] Arrival date & time: 08/12/23 9164  Chief Complaint(s) Shortness of Breath  HPI Stephanie [name] is a 88 y.o. female with past medical history as below, significant for CKD, COPD, PAF, TAVR, type II DM, possible lung cancer pending biopsy who presents to the ED with complaint of dyspnea  Patient reports difficulty breathing throughout the night, she does not wear oxygen  normally at home.  EMS arrival pulse ox of 85% on room air, she improved to 95% on 2 L nasal cannula.  Reports intermittent cough that is nonproductive.  Chest tightness but no chest pain.  Nausea vomiting, fevers or chills.  Reports that she is pending evaluation for treatment of her presumed lung cancer.  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 Wonda and Dr. Lucas.   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   DOE (dyspnea on exertion) 04/08/2020   Asthma 04/08/2020   DM (diabetes mellitus) (HCC) 04/08/2020   Severe aortic  stenosis 04/08/2020   Symptomatic anemia 02/09/2019   Vitamin B12 deficiency    Uncontrolled type 2 diabetes mellitus with hyperglycemia (HCC) 07/06/2017   Essential hypertension, benign 07/06/2017   Mixed hyperlipidemia 07/06/2017   Class 2 severe obesity due to excess calories with serious comorbidity and body mass index (BMI) of 36.0 to 36.9 in adult (HCC) 07/06/2017   Medial meniscus, posterior horn derangement 04/12/2012   OA (osteoarthritis) of knee 04/12/2012   Home Medication(s) Prior to Admission medications   Medication Sig Start Date End Date Taking? Authorizing Provider  albuterol  (PROVENTIL ) (2.5 MG/3ML) 0.083% nebulizer solution USE ONE AMPULE VIA NEBULIZER EVERY 4 HOURS AS NEEDED FOR SHORTNESS OF BREATH OR WHEEZING 01/07/23   Darlean Ozell NOVAK, MD  BD PEN NEEDLE NANO U/F 32G X 4 MM MISC See admin instructions. 03/30/22   [provider]  Blood Pressure Monitor DEVI 1 each by Does not apply route daily. 09/29/22   Miriam Norris, NP  carvedilol  (COREG ) 6.25 MG tablet TAKE ONE TABLET BY MOUTH TWICE DAILY 04/14/23   Miriam Norris, NP  cinacalcet (SENSIPAR) 30 MG tablet Take 30 mg by mouth daily. 03/31/23   [provider]  FEROSUL 325 (65 Fe) MG tablet Take 325 mg by mouth daily.    [provider]  gabapentin  (NEURONTIN ) 100 MG capsule Take 100-200 mg by mouth at bedtime. 10/02/19   [provider]  GNP VITAMIN D3 EXTRA STRENGTH 25 MCG (1000 UT)  tablet Take 1,000 Units by mouth daily. 03/31/23   [provider]  hydrALAZINE  (APRESOLINE ) 50 MG tablet Take 50 mg by mouth 2 (two) times daily. 03/31/23   [provider]  HYDROcodone -acetaminophen  (NORCO/VICODIN) 5-325 MG tablet Take 1 tablet by mouth every 6 (six) hours as needed for moderate pain (pain score 4-6) or severe pain (pain score 7-10). Patient taking differently: Take 1 tablet by mouth at bedtime. 04/08/23   Arlon Carliss ORN, DO  lisinopril  (ZESTRIL ) 2.5 MG tablet Take 1  tablet (2.5 mg total) by mouth daily. 09/29/22 07/28/23  Miriam Norris, NP                                                                                                                                    Past Surgical History Past Surgical History:  Procedure Laterality Date   APPENDECTOMY     BACK SURGERY     BRONCHIAL BIOPSY  03/02/2022   Procedure: BRONCHIAL BIOPSIES;  Surgeon: Brenna Adine CROME, DO;  Location: [facility];  Service: Pulmonary;;   BRONCHIAL BRUSHINGS  03/02/2022   Procedure: BRONCHIAL BRUSHINGS;  Surgeon: Brenna Adine CROME, DO;  Location: [facility];  Service: Pulmonary;;   BRONCHIAL NEEDLE ASPIRATION BIOPSY  03/02/2022   Procedure: BRONCHIAL NEEDLE ASPIRATION BIOPSIES;  Surgeon: Brenna Adine CROME, DO;  Location: [facility];  Service: Pulmonary;;   CYST REMOVAL HAND     Right, hospital in [place]   FIDUCIAL MARKER PLACEMENT  03/02/2022   Procedure: FIDUCIAL MARKER PLACEMENT;  Surgeon: Brenna Adine CROME, DO;  Location: [facility];  Service: Pulmonary;;   INTRAOPERATIVE TRANSTHORACIC ECHOCARDIOGRAM Left 07/01/2020   Procedure: INTRAOPERATIVE TRANSTHORACIC ECHOCARDIOGRAM;  Surgeon: Wonda Sharper, MD;  Location: [facility] OR;  Service: Open Heart Surgery;  Laterality: Left;   KNEE ARTHROSCOPY WITH MEDIAL MENISECTOMY Right 02/28/2012   Procedure: KNEE ARTHROSCOPY WITH MEDIAL MENISECTOMY;  Surgeon: Taft FORBES Minerva, MD;  Location: [facility];  Service: Orthopedics;  Laterality: Right;   REPAIR KNEE LIGAMENT     RIGHT/LEFT HEART CATH AND CORONARY ANGIOGRAPHY N/A 04/10/2020   Procedure: RIGHT/LEFT HEART CATH AND CORONARY ANGIOGRAPHY;  Surgeon: Dann Candyce RAMAN, MD;  Location: [facility] INVASIVE CV LAB;  Service: Cardiovascular;  Laterality: N/A;   TRANSCATHETER AORTIC VALVE REPLACEMENT, TRANSFEMORAL N/A 07/01/2020   Procedure: TRANSCATHETER AORTIC VALVE REPLACEMENT, TRANSFEMORAL;  Surgeon: Wonda Sharper, MD;  Location: [facility] OR;  Service: Open Heart Surgery;  Laterality: N/A;   ULTRASOUND GUIDANCE  FOR VASCULAR ACCESS Bilateral 07/01/2020   Procedure: ULTRASOUND GUIDANCE FOR VASCULAR ACCESS;  Surgeon: Wonda Sharper, MD;  Location: [facility] OR;  Service: Open Heart Surgery;  Laterality: Bilateral;   Family History Family History  Problem Relation Age of Onset   Lung disease Other    Cancer Other    Arthritis Other    Asthma Other    Diabetes Other    Diabetes Mother    Hypertension Mother    Diverticulitis Mother  Diabetes Father    Hypertension Sister    Diabetes Brother    Colon cancer Neg Hx     Social History Social History   Tobacco Use   Smoking status: Former    Current packs/day: 1.00    Types: Cigarettes    Passive exposure: Never   Smokeless tobacco: Never   Tobacco comments:    Quit over 40 years ago.  Does not know how many years she smoked or start/stop date  Vaping Use   Vaping status: Never Used  Substance Use Topics   Alcohol use: No   Drug use: No   Allergies Amoxicillin, Penicillins, Sulfa antibiotics, and Nitrofurantoin  Review of Systems A thorough review of systems was obtained and all systems are negative except as noted in the HPI and PMH.   Physical Exam Vital Signs  I have reviewed the triage vital signs BP (!) 173/64   Pulse (!) 56   Temp 98.8 F (37.1 C) (Oral)   Resp (!) 22   Ht 5' 5 (1.651 m)   Wt 95.3 kg   SpO2 96%   BMI 34.95 kg/m  Physical Exam Vitals and nursing note reviewed.  Constitutional:      General: She is not in acute distress.    Appearance: Normal appearance. She is obese.  HENT:     Head: Normocephalic and atraumatic.     Right Ear: External ear normal.     Left Ear: External ear normal.     Nose: Nose normal.     Mouth/Throat:     Mouth: Mucous membranes are moist.  Eyes:     General: No scleral icterus.       Right eye: No discharge.        Left eye: No discharge.  Cardiovascular:     Rate and Rhythm: Normal rate and regular rhythm.     Pulses: Normal pulses.     Heart sounds: Normal heart  sounds.  Pulmonary:     Effort: Pulmonary effort is normal. Tachypnea present. No respiratory distress.     Breath sounds: No stridor. Wheezing present.  Abdominal:     General: Abdomen is flat. There is no distension.     Palpations: Abdomen is soft.     Tenderness: There is no abdominal tenderness.  Musculoskeletal:     Cervical back: No rigidity.     Right lower leg: No edema.     Left lower leg: No edema.  Skin:    General: Skin is warm and dry.     Capillary Refill: Capillary refill takes less than 2 seconds.  Neurological:     Mental Status: She is alert.  Psychiatric:        Mood and Affect: Mood normal.        Behavior: Behavior normal. Behavior is cooperative.     ED Results and Treatments Labs (all labs ordered are listed, but only abnormal results are displayed) Labs Reviewed  BASIC METABOLIC PANEL WITH GFR - Abnormal; Notable for the following components:      Result Value   Glucose, Bld 154 (*)    Creatinine, Ser 1.51 (*)    Calcium  8.2 (*)    GFR, Estimated 34 (*)    All other components within normal limits  BRAIN NATRIURETIC PEPTIDE - Abnormal; Notable for the following components:   B Natriuretic Peptide 363.0 (*)    All other components within normal limits  CBC WITH DIFFERENTIAL/PLATELET - Abnormal; Notable for the following components:  WBC 10.8 (*)    Hemoglobin 11.9 (*)    Neutro Abs 8.9 (*)    All other components within normal limits  RESP PANEL BY RT-PCR (RSV, FLU A&B, COVID)  [id number]                                                                                                                          Radiology CT Angio Chest PE W and/or Wo Contrast Result Date: 08/12/2023 CLINICAL DATA:  PE suspected, shortness of breath * Tracking Code: BO * EXAM: CT ANGIOGRAPHY CHEST WITH CONTRAST TECHNIQUE: Multidetector CT imaging of the chest was performed using the standard protocol during bolus administration of intravenous contrast. Multiplanar CT  image reconstructions and MIPs were obtained to evaluate the vascular anatomy. RADIATION DOSE REDUCTION: This exam was performed according to the departmental dose-optimization program which includes automated exposure control, adjustment of the mA and/or kV according to patient size and/or use of iterative reconstruction technique. CONTRAST:  OMNIPAQUE  IOHEXOL  350 MG/ML SOLN COMPARISON:  07/28/2023 FINDINGS: Cardiovascular: Examination for pulmonary embolism is limited by breath motion artifact in the lung bases. Within this limitation, no evidence of pulmonary embolism through the segmental pulmonary arterial level. Aortic atherosclerosis. Aortic valve stent endograft. Cardiomegaly. Three-vessel coronary artery calcifications. No pericardial effusion. Mediastinum/Nodes: No enlarged mediastinal, hilar, or axillary lymph nodes. Small hiatal hernia. Tracheobronchomalacia (series 6, image 54). Thyroid  gland and esophagus demonstrate no significant findings. Lungs/Pleura: Small bilateral pleural effusions and associated atelectasis or consolidation, new compared to prior examination. Unchanged right lower lobe mass containing a biopsy marker or fiducial (series 6, image 87). Upper Abdomen: No acute abnormality. Benign macroscopic fat containing left adrenal adenoma. Musculoskeletal: No chest wall abnormality. No acute osseous findings. Nonacute fractures of the anterolateral right ribs (series 5, image 174). Review of the MIP images confirms the above findings. IMPRESSION: 1. Examination for pulmonary embolism is limited by breath motion artifact in the lung bases. Within this limitation, no evidence of pulmonary embolism through the segmental pulmonary arterial level. 2. Small bilateral pleural effusions and associated atelectasis or consolidation, new compared to prior examination. 3. Unchanged right lower lobe mass containing a biopsy marker or fiducial. 4. Cardiomegaly and coronary artery disease. 5.  Tracheobronchomalacia. Aortic Atherosclerosis (ICD10-I70.0). Electronically Signed   By: Marolyn JONETTA Jaksch M.D.   On: 08/12/2023 11:38   DG Chest Port 1 View Result Date: 08/12/2023 CLINICAL DATA:  Shortness of breath. EXAM: PORTABLE CHEST 1 VIEW COMPARISON:  07/28/2023. FINDINGS: Stable cardiomegaly. Prosthetic aortic valve. Aortic atherosclerosis. Prior biopsy fiducial markers again noted in the right lower lung zone with similar surrounding streaky opacity, corresponding the site of known mass, better evaluated on the prior CT. Mild left basilar atelectasis. No new focal consolidation. No sizable pleural effusion. No pneumothorax. Degenerative changes of the bilateral glenohumeral joints. No acute osseous abnormality. IMPRESSION: 1. No significant change compared to the prior exam. Prior biopsy fiducial markers again noted in the right lower lung zone with similar surrounding  streaky opacity, corresponding the site of known mass better evaluated on the prior CT. 2. Mild basilar atelectasis. Electronically Signed   By: Harrietta Sherry M.D.   On: 08/12/2023 09:23    Pertinent labs & imaging results that were available during my care of the patient were reviewed by me and considered in my medical decision making (see MDM for details).  Medications Ordered in ED Medications  levofloxacin  (LEVAQUIN ) IVPB 750 mg (has no administration in time range)  ipratropium-albuterol  (DUONEB) 0.5-2.5 (3) MG/3ML nebulizer solution 3 mL (3 mLs Nebulization Given 08/12/23 0936)  methylPREDNISolone  sodium succinate (SOLU-MEDROL ) 125 mg/2 mL injection 125 mg (125 mg Intravenous Given 08/12/23 1030)  ipratropium-albuterol  (DUONEB) 0.5-2.5 (3) MG/3ML nebulizer solution 3 mL (3 mLs Nebulization Given 08/12/23 1050)  iohexol  (OMNIPAQUE ) 350 MG/ML injection 100 mL (100 mLs Intravenous Contrast Given 08/12/23 1105)                                                                                                                                      Procedures .Critical Care  Performed by: Elnor Jayson LABOR, DO Authorized by: Elnor Jayson LABOR, DO   Critical care provider statement:    Critical care time (minutes):  30   Critical care time was exclusive of:  Separately billable procedures and treating other patients   Critical care was necessary to treat or prevent imminent or life-threatening deterioration of the following conditions:  Respiratory failure   Critical care was time spent personally by me on the following activities:  Development of treatment plan with patient or surrogate, discussions with consultants, evaluation of patient's response to treatment, examination of patient, ordering and review of laboratory studies, ordering and review of radiographic studies, ordering and performing treatments and interventions, pulse oximetry, re-evaluation of patient's condition, review of old charts and obtaining history from patient or surrogate   (including critical care time)  Medical Decision Making / ED Course    Medical Decision Making:    CHIQUITA HECKERT is a 88 y.o. female with past medical history as below, significant for CKD, COPD, PAF, TAVR, type II DM, possible lung cancer pending biopsy who presents to the ED with complaint of dyspnea. The complaint involves an extensive differential diagnosis and also carries with it a high risk of complications and morbidity.  Serious etiology was considered. Ddx includes but is not limited to: In my evaluation of this patient's dyspnea my DDx includes, but is not limited to, pneumonia, pulmonary embolism, pneumothorax, pulmonary edema, metabolic acidosis, asthma, COPD, cardiac cause, anemia, anxiety, etc.    Complete initial physical exam performed, notably the patient was tachypneic, wheezing noted right greater than left.  Hypoxia on room air.    Reviewed and confirmed nursing documentation for past medical history, family history, social history.  Vital signs reviewed.     Dyspnea  hx COPD, asthma Pleural eff > - Hypoxia on room air, no home  oxygen  use.  Wheezing noted.  Somewhat improved with nebulized breathing treatments.  Steroids. - History of lung cancer, get CT PE >> CT shows small pleural effusions.  No PE, possible consolidation, ?pna?,  - Concern for COPD exacerbation, patient was hypoxic on room air.  Will attempt to de-escalate, check ambulatory pulse ox - Pleural effusion noted on imaging, BNP is mildly elevated.  Start diuresis   Clinical Course as of 08/12/23 1603  Fri Aug 12, 2023  1313 B Natriuretic Peptide(!): 363.0 Echo 9/24 with LVEF 55 to 60%, G1 DD   [SG]  1534 Received sign out from Dr. Elnor pending ambulation trial  [WS]    Clinical Course User Index [SG] Elnor Jayson LABOR, DO [WS] Francesca Elsie CROME, MD     Handoff dr francesca pending ambulatory pulse ox and recheck, would favor admission at this pt given hypoxia and multiple co-morbidities                  Additional history obtained: -Additional history obtained from family -External records from outside source obtained and reviewed including: Chart review including previous notes, labs, imaging, consultation notes including  Prior echo, primary care documentation, prior ER visit   Lab Tests: -I ordered, reviewed, and interpreted labs.   The pertinent results include:   Labs Reviewed  BASIC METABOLIC PANEL WITH GFR - Abnormal; Notable for the following components:      Result Value   Glucose, Bld 154 (*)    Creatinine, Ser 1.51 (*)    Calcium  8.2 (*)    GFR, Estimated 34 (*)    All other components within normal limits  BRAIN NATRIURETIC PEPTIDE - Abnormal; Notable for the following components:   B Natriuretic Peptide 363.0 (*)    All other components within normal limits  CBC WITH DIFFERENTIAL/PLATELET - Abnormal; Notable for the following components:   WBC 10.8 (*)    Hemoglobin 11.9 (*)    Neutro Abs 8.9 (*)    All other components within  normal limits  RESP PANEL BY RT-PCR (RSV, FLU A&B, COVID)  RVPGX2    Notable for BNP elevated creatinine similar to prior  EKG   EKG Interpretation Date/Time:  Friday August 12 2023 09:20:46 EDT Ventricular Rate:  66 PR Interval:    QRS Duration:  94 QT Interval:  417 QTC Calculation: 437 R Axis:   -80  Text Interpretation: Normal sinus rhythm Inferior infarct, old Anterior infarct, old Confirmed by Francesca Elsie ([id number]) on 08/12/2023 3:35:03 PM         Imaging Studies ordered: I ordered imaging studies including CT PE chest x-ray I independently visualized the following imaging with scope of interpretation limited to determining acute life threatening conditions related to emergency care; findings noted above I agree with the radiologist interpretation If any imaging was obtained with contrast I closely monitored patient for any possible adverse reaction a/w contrast administration in the emergency department   Medicines ordered and prescription drug management: Meds ordered this encounter  Medications   ipratropium-albuterol  (DUONEB) 0.5-2.5 (3) MG/3ML nebulizer solution 3 mL   methylPREDNISolone  sodium succinate (SOLU-MEDROL ) 125 mg/2 mL injection 125 mg   ipratropium-albuterol  (DUONEB) 0.5-2.5 (3) MG/3ML nebulizer solution 3 mL   iohexol  (OMNIPAQUE ) 350 MG/ML injection 100 mL   DISCONTD: doxycycline (VIBRAMYCIN) 100 MG capsule    Sig: Take 1 capsule (100 mg total) by mouth 2 (two) times daily.    Dispense:  20 capsule    Refill:  0   DISCONTD: predniSONE  (  DELTASONE ) 20 MG tablet    Sig: Take 2 tablets (40 mg total) by mouth daily for 5 days.    Dispense:  10 tablet    Refill:  0   DISCONTD: guaiFENesin-dextromethorphan (ROBITUSSIN DM) 100-10 MG/5ML syrup    Sig: Take 5 mLs by mouth every 4 (four) hours as needed for cough.    Dispense:  118 mL    Refill:  0   levofloxacin  (LEVAQUIN ) IVPB 750 mg    Antibiotic Indication::   CAP    -I have reviewed the patients  home medicines and have made adjustments as needed   Consultations Obtained: Not applicable  Cardiac Monitoring: The patient was maintained on a cardiac monitor.  I personally viewed and interpreted the cardiac monitored which showed an underlying rhythm of: Sinus bradycardia Continuous pulse oximetry interpreted by myself, 95% on 2 LNC.    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 Wonda and Dr. Lucas.   Severe aortic stenosis    Symptomatic anemia    Presumed GI bleed January 2021 - she declined GI work-up   Type 2 diabetes mellitus (HCC)       Dispostion: Disposition decision including need for hospitalization was considered, and patient disposition pending at time of sign out.    Final Clinical Impression(s) / ED Diagnoses Final diagnoses:  COPD exacerbation (HCC)  Pleural effusion  Hypoxia        Elnor Jayson LABOR, DO 08/12/23 1603

## 2023-08-12 NOTE — Consult Note (Incomplete)
 Initial Consultation Note   Patient: Stephanie [name] DOB: [birth year] PCP: [facility] DOA: 08/12/2023 DOS: the patient was seen and examined on 08/12/2023 Primary service: Pearlean Manus, MD  Referring physician: *** Reason for consult: ***  Assessment/Plan: Assessment and Plan: No notes have been filed under this hospital service. Service: Hospitalist    1)Very Low-salt diet advised---Less than 2 gm of Sodium per day advised----ok to use Mrs DASH salt substitute instead of Salt 2)Weigh yourself daily, call if you gain more than 3 pounds in 1 day or more than 5 pounds in 1 week as your diuretic medications may need to be adjusted 3) please keep your appointment for previously scheduled lung biopsy 4)--please note that there has been some changes to medications, please take medications as prescribed 5)Follow-up with primary care physician in 3 to 4 days for reevaluation and recheck  Allergies as of 08/12/2023       Reactions   Amoxicillin Swelling   Tongue swelling   Penicillins Swelling   Sulfa Antibiotics Swelling   Lip swelling   Nitrofurantoin Nausea And Vomiting        Medication List     STOP taking these medications    lisinopril  2.5 MG tablet Commonly known as: ZESTRIL        TAKE these medications    albuterol  (2.5 MG/3ML) 0.083% nebulizer solution Commonly known as: PROVENTIL  Take 3 mLs (2.5 mg total) by nebulization every 2 (two) hours as needed for wheezing or shortness of breath. What changed: See the new instructions.   BD Pen Needle Nano U/F 32G X 4 MM Misc Generic drug: Insulin  Pen Needle See admin instructions.   Blood Pressure Monitor Devi 1 each by Does not apply route daily.   Breo Ellipta 200-25 MCG/ACT Aepb Generic drug: fluticasone  furoate-vilanterol Inhale 1 puff into the lungs daily.   carvedilol  6.25 MG tablet Commonly known as: COREG  Take 1 tablet (6.25 mg total) by mouth 2 (two)  times daily.   cinacalcet 30 MG tablet Commonly known as: SENSIPAR Take 30 mg by mouth daily.   FeroSul 325 (65 Fe) MG tablet Generic drug: ferrous sulfate  Take 325 mg by mouth daily.   gabapentin  100 MG capsule Commonly known as: NEURONTIN  Take 100-200 mg by mouth at bedtime.   GNP Vitamin D3 Extra Strength 25 MCG (1000 UT) tablet Generic drug: Cholecalciferol Take 1,000 Units by mouth daily.   guaiFENesin 600 MG 12 hr tablet Commonly known as: Mucinex Take 1 tablet (600 mg total) by mouth 2 (two) times daily.   hydrALAZINE  50 MG tablet Commonly known as: APRESOLINE  Take 1 tablet (50 mg total) by mouth 3 (three) times daily. What changed: when to take this   HYDROcodone -acetaminophen  5-325 MG tablet Commonly known as: NORCO/VICODIN Take 1 tablet by mouth every 6 (six) hours as needed for moderate pain (pain score 4-6) or severe pain (pain score 7-10). What changed: when to take this   isosorbide mononitrate 30 MG 24 hr tablet Commonly known as: IMDUR Take 1 tablet (30 mg total) by mouth daily.   levofloxacin  750 MG tablet Commonly known as: Levaquin  Take 1 tablet (750 mg total) by mouth daily for 5 days. Start taking on: August 13, 2023          [facility] will sign off at present, please call us  again when needed.  HPI: Stephanie [name] is a 88 y.o. female with past medical history of ***.  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 Wonda and Dr. Lucas.   Severe aortic stenosis    Symptomatic anemia    Presumed GI bleed January 2021 - she declined GI work-up   Type 2 diabetes  mellitus [facility])    Past Surgical History:  Procedure Laterality Date   APPENDECTOMY     BACK SURGERY     BRONCHIAL BIOPSY  03/02/2022   Procedure: BRONCHIAL BIOPSIES;  Surgeon: Brenna Adine CROME, DO;  Location: [facility];  Service: Pulmonary;;   BRONCHIAL BRUSHINGS  03/02/2022   Procedure: BRONCHIAL BRUSHINGS;  Surgeon: Brenna Adine CROME, DO;  Location: [facility];  Service: Pulmonary;;   BRONCHIAL NEEDLE ASPIRATION BIOPSY  03/02/2022   Procedure: BRONCHIAL NEEDLE ASPIRATION BIOPSIES;  Surgeon: Brenna Adine CROME, DO;  Location: [facility];  Service: Pulmonary;;   CYST REMOVAL HAND     Right, hospital in [place]   FIDUCIAL MARKER PLACEMENT  03/02/2022   Procedure: FIDUCIAL MARKER PLACEMENT;  Surgeon: Brenna Adine CROME, DO;  Location: [facility];  Service: Pulmonary;;   INTRAOPERATIVE TRANSTHORACIC ECHOCARDIOGRAM Left 07/01/2020   Procedure: INTRAOPERATIVE TRANSTHORACIC ECHOCARDIOGRAM;  Surgeon: Wonda Sharper, MD;  Location: [facility] OR;  Service: Open Heart Surgery;  Laterality: Left;   KNEE ARTHROSCOPY WITH MEDIAL MENISECTOMY Right 02/28/2012   Procedure: KNEE ARTHROSCOPY WITH MEDIAL MENISECTOMY;  Surgeon: Taft FORBES Minerva, MD;  Location: [facility];  Service: Orthopedics;  Laterality: Right;   REPAIR KNEE LIGAMENT     RIGHT/LEFT HEART CATH AND CORONARY ANGIOGRAPHY N/A 04/10/2020   Procedure: RIGHT/LEFT HEART CATH AND CORONARY ANGIOGRAPHY;  Surgeon: Dann Candyce RAMAN, MD;  Location: [facility] INVASIVE CV LAB;  Service: Cardiovascular;  Laterality: N/A;   TRANSCATHETER AORTIC VALVE REPLACEMENT, TRANSFEMORAL N/A 07/01/2020   Procedure: TRANSCATHETER AORTIC VALVE REPLACEMENT, TRANSFEMORAL;  Surgeon: Wonda Sharper, MD;  Location: [facility] OR;  Service: Open Heart Surgery;  Laterality: N/A;   ULTRASOUND GUIDANCE FOR VASCULAR ACCESS Bilateral 07/01/2020   Procedure: ULTRASOUND GUIDANCE FOR VASCULAR ACCESS;  Surgeon: Wonda Sharper, MD;  Location: [facility] OR;  Service: Open Heart Surgery;  Laterality: Bilateral;   Social  History:  reports that she has quit smoking. Her smoking use included cigarettes. She has never been exposed to tobacco smoke. She has never used smokeless tobacco. She reports that she does not drink alcohol and does not use drugs.  Allergies  Allergen Reactions   Amoxicillin Swelling    Tongue swelling    Penicillins Swelling   Sulfa Antibiotics Swelling    Lip swelling   Nitrofurantoin Nausea And Vomiting    Family History  Problem Relation Age of Onset   Lung disease Other    Cancer Other    Arthritis Other    Asthma Other    Diabetes Other    Diabetes Mother    Hypertension Mother    Diverticulitis Mother    Diabetes Father    Hypertension Sister    Diabetes Brother    Colon cancer Neg Hx     Prior to Admission medications   Medication Sig Start Date End Date Taking? Authorizing Provider  fluticasone  furoate-vilanterol (BREO ELLIPTA) 200-25 MCG/ACT  AEPB Inhale 1 puff into the lungs daily. 08/12/23  Yes Sherol Sabas, MD  guaiFENesin (MUCINEX) 600 MG 12 hr tablet Take 1 tablet (600 mg total) by mouth 2 (two) times daily. 08/12/23 08/11/24 Yes Ardyn Forge, MD  isosorbide mononitrate (IMDUR) 30 MG 24 hr tablet Take 1 tablet (30 mg total) by mouth daily. 08/12/23 08/11/24 Yes Jordyne Poehlman, MD  levofloxacin  (LEVAQUIN ) 750 MG tablet Take 1 tablet (750 mg total) by mouth daily for 5 days. 08/13/23 08/18/23 Yes Bhavya Eschete, MD  albuterol  (PROVENTIL ) (2.5 MG/3ML) 0.083% nebulizer solution Take 3 mLs (2.5 mg total) by nebulization every 2 (two) hours as needed for wheezing or shortness of breath. 08/12/23   Pearlean Manus, MD  BD PEN NEEDLE NANO U/F 32G X 4 MM MISC See admin instructions. 03/30/22   [provider]  Blood Pressure Monitor DEVI 1 each by Does not apply route daily. 09/29/22   Miriam Norris, NP  carvedilol  (COREG ) 6.25 MG tablet Take 1 tablet (6.25 mg total) by mouth 2 (two) times daily. 08/12/23   Pearlean Manus, MD  cinacalcet (SENSIPAR) 30 MG  tablet Take 30 mg by mouth daily. 03/31/23   [provider]  FEROSUL 325 (65 Fe) MG tablet Take 325 mg by mouth daily.    [provider]  gabapentin  (NEURONTIN ) 100 MG capsule Take 100-200 mg by mouth at bedtime. 10/02/19   [provider]  GNP VITAMIN D3 EXTRA STRENGTH 25 MCG (1000 UT) tablet Take 1,000 Units by mouth daily. 03/31/23   [provider]  hydrALAZINE  (APRESOLINE ) 50 MG tablet Take 1 tablet (50 mg total) by mouth 3 (three) times daily. 08/12/23   Pearlean Manus, MD  HYDROcodone -acetaminophen  (NORCO/VICODIN) 5-325 MG tablet Take 1 tablet by mouth every 6 (six) hours as needed for moderate pain (pain score 4-6) or severe pain (pain score 7-10). Patient taking differently: Take 1 tablet by mouth at bedtime. 04/08/23   Arlon Carliss ORN, DO    Physical Exam: Vitals:   08/12/23 1530 08/12/23 1645 08/12/23 1648 08/12/23 1715  BP: (!) 204/87 (!) 191/66  (!) 176/64  Pulse: 61 (!) 59  60  Resp: (!) 29 18    Temp:   97.9 F (36.6 C)   TempSrc:   Oral   SpO2: 90% 92%  91%  Weight:      Height:       *** Data Reviewed:  {Tip this will not be part of the note when signed- Document your independent interpretation of telemetry tracing, EKG, lab, Radiology test or any other diagnostic tests. Add any new diagnostic test ordered today. (Optional):26781} {Results:26384}  {Tip this will not be part of the note when signed  DVT Prophylaxis  .,  (Optional):26781}  Family Communication: *** Primary team communication: *** Thank you very much for involving us  in the care of your patient.  Author: Manus Pearlean, MD 08/12/2023 5:58 PM  For on call review [email or web address].

## 2023-08-12 NOTE — Consult Note (Signed)
 Initial Consultation Note   Patient: Stephanie [name] FMW:[id number] DOB: December 18, 1935 PCP: [facility] DOA: 08/12/2023 DOS: the patient was seen and examined on 08/12/2023 Primary service: Pearlean Manus, MD  Referring physician: Valeda Reason for consult: Dyspnea  Assessment/Plan: Assessment and Plan: 1) acute asthma/COPD (GOLD 3) exacerbation--- much improved after interventions - Post ambulation O2 sats 95% on room air - No conversational dyspnea, no dyspnea on exertion - CTA chest finding of possible left-sided compressive atelectasis versus consolidation noted in the setting of small bilateral pleural effusions - EDP empirically gave Levaquin  x 1 -COVID, flu and RSV negative -WBC 10.8 similar to priors -No fevers - Okay to discharge on p.o. prednisone , mucolytics, bronchodilators and Levaquin  with outpatient follow-up  2)Rt LL Mass--proceed with lung biopsy as previously scheduled as outpatient -  3)Dyspnea--multifactorial, BNP 363 similar to prior from 07/28/2023 -Echo from 09/23/2022 with EF of 55 to 60% and grade 1 diastolic dysfunction with severe LVH, no mitral or aortic stenosis -CT findings without PE and as noted as above #1 -Improved as noted above #1 after intervention-- -at the time of discharge no hypoxia, and no dyspnea -Management as above #1---  3)HTN--elevated on admission, BP improving -Low-salt diet advised - Please see discharge med rec for med adjustments  4)CKD stage - IV - Renal function stable with creatinine similar to 07/28/2023 renally adjust medications, avoid nephrotoxic agents / dehydration  / hypotension  5) mild chronic anemia--Hgb 11.9 which is similar to priors - No acute bleeding concerns at this time  Disposition--- discharge home with outpatient follow-up with PCP  Discharge instructions 1)Very Low-salt diet advised---Less than 2 gm of Sodium per day advised----ok to use Mrs DASH salt substitute  instead of Salt 2)Weigh yourself daily, call if you gain more than 3 pounds in 1 day or more than 5 pounds in 1 week as your diuretic medications may need to be adjusted 3) please keep your appointment for previously scheduled lung biopsy 4)--please note that there has been some changes to medications, please take medications as prescribed 5)Follow-up with primary care physician in 3 to 4 days for reevaluation and recheck  Allergies as of 08/12/2023       Reactions   Amoxicillin Swelling   Tongue swelling   Penicillins Swelling   Sulfa Antibiotics Swelling   Lip swelling   Nitrofurantoin Nausea And Vomiting        Medication List     STOP taking these medications    lisinopril  2.5 MG tablet Commonly known as: ZESTRIL        TAKE these medications    albuterol  (2.5 MG/3ML) 0.083% nebulizer solution Commonly known as: PROVENTIL  Take 3 mLs (2.5 mg total) by nebulization every 2 (two) hours as needed for wheezing or shortness of breath. What changed: See the new instructions.   BD Pen Needle Nano U/F 32G X 4 MM Misc Generic drug: Insulin  Pen Needle See admin instructions.   Blood Pressure Monitor Devi 1 each by Does not apply route daily.   Breo Ellipta 200-25 MCG/ACT Aepb Generic drug: fluticasone  furoate-vilanterol Inhale 1 puff into the lungs daily.   carvedilol  6.25 MG tablet Commonly known as: COREG  Take 1 tablet (6.25 mg total) by mouth 2 (two) times daily.   cinacalcet 30 MG tablet Commonly known as: SENSIPAR Take 30 mg by mouth daily.   FeroSul 325 (65 Fe) MG tablet Generic drug: ferrous sulfate  Take 325 mg by mouth daily.   gabapentin  100 MG capsule Commonly known  as: NEURONTIN  Take 100-200 mg by mouth at bedtime.   GNP Vitamin D3 Extra Strength 25 MCG (1000 UT) tablet Generic drug: Cholecalciferol Take 1,000 Units by mouth daily.   guaiFENesin 600 MG 12 hr tablet Commonly known as: Mucinex Take 1 tablet (600 mg total) by mouth 2 (two) times  daily.   hydrALAZINE  50 MG tablet Commonly known as: APRESOLINE  Take 1 tablet (50 mg total) by mouth 3 (three) times daily. What changed: when to take this   HYDROcodone -acetaminophen  5-325 MG tablet Commonly known as: NORCO/VICODIN Take 1 tablet by mouth every 6 (six) hours as needed for moderate pain (pain score 4-6) or severe pain (pain score 7-10). What changed: when to take this   isosorbide mononitrate 30 MG 24 hr tablet Commonly known as: IMDUR Take 1 tablet (30 mg total) by mouth daily.   levofloxacin  750 MG tablet Commonly known as: Levaquin  Take 1 tablet (750 mg total) by mouth daily for 5 days. Start taking on: August 13, 2023   predniSONE  20 MG tablet Commonly known as: DELTASONE  Take 2 tablets (40 mg total) by mouth daily with breakfast for 5 days.       [facility] will sign off at present, please call us  again when needed.  HPI: Stephanie [name] is a 88 y.o. female who is a reformed smoker with past medical history relevant for asthma/COPD Gold 3, CKD stage IV, HTN, status post TAVR, DM 2, possible right lung cancer undergoing workup and paroxysmal A-fib--presented to the ED with dyspnea. - Patient states she has had intermittent dyspnea overnight used inhalers with little to no relief No fever  Or chills  -no headaches, no palpitations, - No URI symptoms  No Nausea, Vomiting or Diarrhea No chest pains, no leg pains or leg swelling and no pleuritic symptoms - Patient received IV Solu-Medrol , Levaquin  bronchodilators in the ED respiratory status improved significantly -- Post interventions patient ambulated on O2 sats was at 95% on room air  CTA chest--IMPRESSION: 1. Examination for pulmonary embolism is limited by breath motion artifact in the lung bases. Within this limitation, no evidence of pulmonary embolism through the segmental pulmonary arterial level. 2. Small bilateral pleural effusions and associated atelectasis or consolidation, new compared to prior  examination. 3. Unchanged right lower lobe mass containing a biopsy marker or fiducial. 4. Cardiomegaly and coronary artery disease. 5. Tracheobronchomalacia.   Aortic Atherosclerosis (ICD10-I70.0  - Creatinine 1.51 similar to prior potassium 4.7 - BNP 363, it was 400 on 07/28/2023 - WBC 10.8 similar to recent priors hemoglobin 11.9 similar to priors platelets 217 - COVID flu and RSV negative  Review of Systems: As mentioned in the history of present illness. All other systems reviewed and are negative. 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 Wonda and Dr. Lucas.   Severe aortic stenosis    Symptomatic anemia    Presumed GI bleed January 2021 - she declined GI work-up   Type 2 diabetes mellitus (HCC)    Past Surgical History:  Procedure Laterality Date   APPENDECTOMY     BACK SURGERY  BRONCHIAL BIOPSY  03/02/2022   Procedure: BRONCHIAL BIOPSIES;  Surgeon: Brenna Adine CROME, DO;  Location: [facility];  Service: Pulmonary;;   BRONCHIAL BRUSHINGS  03/02/2022   Procedure: BRONCHIAL BRUSHINGS;  Surgeon: Brenna Adine CROME, DO;  Location: [facility];  Service: Pulmonary;;   BRONCHIAL NEEDLE ASPIRATION BIOPSY  03/02/2022   Procedure: BRONCHIAL NEEDLE ASPIRATION BIOPSIES;  Surgeon: Brenna Adine CROME, DO;  Location: [facility];  Service: Pulmonary;;   CYST REMOVAL HAND     Right, hospital in [place]   FIDUCIAL MARKER PLACEMENT  03/02/2022   Procedure: FIDUCIAL MARKER PLACEMENT;  Surgeon: Brenna Adine CROME, DO;  Location: [facility];  Service: Pulmonary;;   INTRAOPERATIVE TRANSTHORACIC ECHOCARDIOGRAM Left 07/01/2020   Procedure: INTRAOPERATIVE  TRANSTHORACIC ECHOCARDIOGRAM;  Surgeon: Wonda Sharper, MD;  Location: [facility] OR;  Service: Open Heart Surgery;  Laterality: Left;   KNEE ARTHROSCOPY WITH MEDIAL MENISECTOMY Right 02/28/2012   Procedure: KNEE ARTHROSCOPY WITH MEDIAL MENISECTOMY;  Surgeon: Taft FORBES Minerva, MD;  Location: [facility];  Service: Orthopedics;  Laterality: Right;   REPAIR KNEE LIGAMENT     RIGHT/LEFT HEART CATH AND CORONARY ANGIOGRAPHY N/A 04/10/2020   Procedure: RIGHT/LEFT HEART CATH AND CORONARY ANGIOGRAPHY;  Surgeon: Dann Candyce RAMAN, MD;  Location: [facility] INVASIVE CV LAB;  Service: Cardiovascular;  Laterality: N/A;   TRANSCATHETER AORTIC VALVE REPLACEMENT, TRANSFEMORAL N/A 07/01/2020   Procedure: TRANSCATHETER AORTIC VALVE REPLACEMENT, TRANSFEMORAL;  Surgeon: Wonda Sharper, MD;  Location: [facility] OR;  Service: Open Heart Surgery;  Laterality: N/A;   ULTRASOUND GUIDANCE FOR VASCULAR ACCESS Bilateral 07/01/2020   Procedure: ULTRASOUND GUIDANCE FOR VASCULAR ACCESS;  Surgeon: Wonda Sharper, MD;  Location: [facility] OR;  Service: Open Heart Surgery;  Laterality: Bilateral;   Social History:  reports that she has quit smoking. Her smoking use included cigarettes. She has never been exposed to tobacco smoke. She has never used smokeless tobacco. She reports that she does not drink alcohol and does not use drugs.  Allergies  Allergen Reactions   Amoxicillin Swelling    Tongue swelling    Penicillins Swelling   Sulfa Antibiotics Swelling    Lip swelling   Nitrofurantoin Nausea And Vomiting    Family History  Problem Relation Age of Onset   Lung disease Other    Cancer Other    Arthritis Other    Asthma Other    Diabetes Other    Diabetes Mother    Hypertension Mother    Diverticulitis Mother    Diabetes Father    Hypertension Sister    Diabetes Brother    Colon cancer Neg Hx     Prior to Admission medications   Medication Sig Start Date End Date Taking? Authorizing Provider  fluticasone  furoate-vilanterol (BREO  ELLIPTA) 200-25 MCG/ACT AEPB Inhale 1 puff into the lungs daily. 08/12/23  Yes Myalynn Lingle, MD  guaiFENesin (MUCINEX) 600 MG 12 hr tablet Take 1 tablet (600 mg total) by mouth 2 (two) times daily. 08/12/23 08/11/24 Yes Blyss Lugar, MD  isosorbide mononitrate (IMDUR) 30 MG 24 hr tablet Take 1 tablet (30 mg total) by mouth daily. 08/12/23 08/11/24 Yes Desire Fulp, MD  levofloxacin  (LEVAQUIN ) 750 MG tablet Take 1 tablet (750 mg total) by mouth daily for 5 days. 08/13/23 08/18/23 Yes Kashtyn Jankowski, MD  predniSONE  (DELTASONE ) 20 MG tablet Take 2 tablets (40 mg total) by mouth daily with breakfast for 5 days. 08/12/23 08/17/23 Yes Katilin Raynes, MD  albuterol  (PROVENTIL ) (2.5 MG/3ML) 0.083% nebulizer solution Take 3 mLs (2.5 mg total) by nebulization every 2 (two)  hours as needed for wheezing or shortness of breath. 08/12/23   Pearlean Manus, MD  BD PEN NEEDLE NANO U/F 32G X 4 MM MISC See admin instructions. 03/30/22   [provider]  Blood Pressure Monitor DEVI 1 each by Does not apply route daily. 09/29/22   Miriam Norris, NP  carvedilol  (COREG ) 6.25 MG tablet Take 1 tablet (6.25 mg total) by mouth 2 (two) times daily. 08/12/23   Pearlean Manus, MD  cinacalcet (SENSIPAR) 30 MG tablet Take 30 mg by mouth daily. 03/31/23   [provider]  FEROSUL 325 (65 Fe) MG tablet Take 325 mg by mouth daily.    [provider]  gabapentin  (NEURONTIN ) 100 MG capsule Take 100-200 mg by mouth at bedtime. 10/02/19   [provider]  GNP VITAMIN D3 EXTRA STRENGTH 25 MCG (1000 UT) tablet Take 1,000 Units by mouth daily. 03/31/23   [provider]  hydrALAZINE  (APRESOLINE ) 50 MG tablet Take 1 tablet (50 mg total) by mouth 3 (three) times daily. 08/12/23   Pearlean Manus, MD  HYDROcodone -acetaminophen  (NORCO/VICODIN) 5-325 MG tablet Take 1 tablet by mouth every 6 (six) hours as needed for moderate pain (pain score 4-6) or severe pain (pain score 7-10). Patient taking  differently: Take 1 tablet by mouth at bedtime. 04/08/23   Arlon Carliss ORN, DO    Physical Exam: Vitals:   08/12/23 1530 08/12/23 1645 08/12/23 1648 08/12/23 1715  BP: (!) 204/87 (!) 191/66  (!) 176/64  Pulse: 61 (!) 59  60  Resp: (!) 29 18    Temp:   97.9 F (36.6 C)   TempSrc:   Oral   SpO2: 90% 92%  91%  Weight:      Height:        Physical Exam  Gen:- Awake Alert, in no acute distress , no conversational dyspnea, no significant dyspnea on exertion either  HEENT:- [place].AT, No sclera icterus Neck-Supple Neck,No JVD,.  Lungs-  CTAB , fair air movement bilaterally , no wheezing rales or rhonchi CV- S1, S2 normal, RRR Abd-  +ve B.Sounds, Abd Soft, No tenderness,    Extremity/Skin:- No  edema,   good pedal pulses  Psych-affect is appropriate, oriented x3 Neuro-no new focal deficits, no tremors  Data Reviewed:  CTA chest--IMPRESSION: 1. Examination for pulmonary embolism is limited by breath motion artifact in the lung bases. Within this limitation, no evidence of pulmonary embolism through the segmental pulmonary arterial level. 2. Small bilateral pleural effusions and associated atelectasis or consolidation, new compared to prior examination. 3. Unchanged right lower lobe mass containing a biopsy marker or fiducial. 4. Cardiomegaly and coronary artery disease. 5. Tracheobronchomalacia.   Aortic Atherosclerosis (ICD10-I70.0  - Creatinine 1.51 similar to prior potassium 4.7 - BNP 363, it was 400 on 07/28/2023 - WBC 10.8 similar to recent priors hemoglobin 11.9 similar to priors platelets 217 - COVID flu and RSV negative  Family Communication: None at bedside Primary team communication: Notified EDP via secure chat after patient was evaluated Thank you very much for involving us  in the care of your patient.  Author: Manus Pearlean, MD 08/12/2023 6:35 PM  For on call review [email or web address].

## 2023-08-15 ENCOUNTER — Other Ambulatory Visit: Payer: Self-pay | Admitting: Nurse Practitioner

## 2023-08-22 ENCOUNTER — Ambulatory Visit (INDEPENDENT_AMBULATORY_CARE_PROVIDER_SITE_OTHER): Payer: 59 | Admitting: Vascular Surgery

## 2023-08-22 ENCOUNTER — Encounter (INDEPENDENT_AMBULATORY_CARE_PROVIDER_SITE_OTHER): Payer: 59

## 2023-08-22 NOTE — Progress Notes (Deleted)
 MRN : [id number]  Stephanie [name] is a 88 y.o. (Nov 30, 1935) female who presents with chief complaint of check circulation.  History of Present Illness:   The patient returns to the office for followup and review of the noninvasive studies.    There have been no interval changes in lower extremity symptoms per the patient. No interval shortening of the patient's claudication distance or development of rest pain symptoms. No new ulcers or wounds have occurred since the last visit.   There have been no significant changes to the patient's overall health care.   The patient denies amaurosis fugax or recent TIA symptoms. There are no documented recent neurological changes noted. There is no history of DVT, PE or superficial thrombophlebitis. The patient denies recent episodes of angina or shortness of breath.    ABI Rt=0.71 and Lt=0.67  (monophasic signals bilaterally) (previous ABI's Rt=0.69 and Lt=0.63)  No outpatient medications have been marked as taking for the 08/22/23 encounter (Appointment) with Jama, Cordella MATSU, MD.    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 Wonda and Dr. Lucas.   Severe aortic stenosis    Symptomatic anemia    Presumed GI bleed January 2021 - she declined GI work-up   Type 2 diabetes mellitus [facility])     Past Surgical History:  Procedure Laterality Date   APPENDECTOMY     BACK SURGERY     BRONCHIAL BIOPSY  03/02/2022   Procedure: BRONCHIAL BIOPSIES;  Surgeon: Brenna Adine LITTIE, DO;  Location: [facility];  Service: Pulmonary;;   BRONCHIAL  BRUSHINGS  03/02/2022   Procedure: BRONCHIAL BRUSHINGS;  Surgeon: Brenna Adine LITTIE, DO;  Location: [facility];  Service: Pulmonary;;   BRONCHIAL NEEDLE ASPIRATION BIOPSY  03/02/2022   Procedure: BRONCHIAL NEEDLE ASPIRATION BIOPSIES;  Surgeon: Brenna Adine LITTIE, DO;  Location: [facility];  Service: Pulmonary;;   CYST REMOVAL HAND     Right, hospital in [place]   FIDUCIAL MARKER PLACEMENT  03/02/2022   Procedure: FIDUCIAL MARKER PLACEMENT;  Surgeon: Brenna Adine LITTIE, DO;  Location: [facility];  Service: Pulmonary;;   INTRAOPERATIVE TRANSTHORACIC ECHOCARDIOGRAM Left 07/01/2020   Procedure: INTRAOPERATIVE TRANSTHORACIC ECHOCARDIOGRAM;  Surgeon: Wonda Sharper, MD;  Location: [facility] OR;  Service: Open Heart Surgery;  Laterality: Left;   KNEE ARTHROSCOPY WITH MEDIAL MENISECTOMY Right 02/28/2012   Procedure: KNEE ARTHROSCOPY WITH MEDIAL MENISECTOMY;  Surgeon: Taft FORBES Minerva, MD;  Location: [facility];  Service: Orthopedics;  Laterality: Right;   REPAIR KNEE LIGAMENT     RIGHT/LEFT HEART CATH AND CORONARY ANGIOGRAPHY N/A 04/10/2020   Procedure: RIGHT/LEFT HEART CATH AND CORONARY ANGIOGRAPHY;  Surgeon: Dann Candyce RAMAN, MD;  Location: [facility] INVASIVE CV LAB;  Service: Cardiovascular;  Laterality:  N/A;   TRANSCATHETER AORTIC VALVE REPLACEMENT, TRANSFEMORAL N/A 07/01/2020   Procedure: TRANSCATHETER AORTIC VALVE REPLACEMENT, TRANSFEMORAL;  Surgeon: Wonda Sharper, MD;  Location: [facility] OR;  Service: Open Heart Surgery;  Laterality: N/A;   ULTRASOUND GUIDANCE FOR VASCULAR ACCESS Bilateral 07/01/2020   Procedure: ULTRASOUND GUIDANCE FOR VASCULAR ACCESS;  Surgeon: Wonda Sharper, MD;  Location: [facility] OR;  Service: Open Heart Surgery;  Laterality: Bilateral;    Social History Social History   Tobacco Use   Smoking status: Former    Current packs/day: 1.00    Types: Cigarettes    Passive exposure: Never   Smokeless tobacco: Never   Tobacco comments:    Quit over 40 years ago.  Does not know how many years she smoked  or start/stop date  Vaping Use   Vaping status: Never Used  Substance Use Topics   Alcohol use: No   Drug use: No    Family History Family History  Problem Relation Age of Onset   Lung disease Other    Cancer Other    Arthritis Other    Asthma Other    Diabetes Other    Diabetes Mother    Hypertension Mother    Diverticulitis Mother    Diabetes Father    Hypertension Sister    Diabetes Brother    Colon cancer Neg Hx     Allergies  Allergen Reactions   Amoxicillin Swelling    Tongue swelling    Penicillins Swelling   Sulfa Antibiotics Swelling    Lip swelling   Nitrofurantoin Nausea And Vomiting     REVIEW OF SYSTEMS (Negative unless checked)  Constitutional: [] Weight loss  [] Fever  [] Chills Cardiac: [] Chest pain   [] Chest pressure   [] Palpitations   [] Shortness of breath when laying flat   [] Shortness of breath with exertion. Vascular:  [x] Pain in legs with walking   [] Pain in legs at rest  [] History of DVT   [] Phlebitis   [] Swelling in legs   [] Varicose veins   [] Non-healing ulcers Pulmonary:   [] Uses home oxygen    [] Productive cough   [] Hemoptysis   [] Wheeze  [x] COPD   [x] Asthma Neurologic:  [] Dizziness   [] Seizures   [] History of stroke   [] History of TIA  [] Aphasia   [] Vissual changes   [] Weakness or numbness in arm   [] Weakness or numbness in leg Musculoskeletal:   [] Joint swelling   [] Joint pain   [] Low back pain Hematologic:  [] Easy bruising  [] Easy bleeding   [] Hypercoagulable state   [] Anemic Gastrointestinal:  [] Diarrhea   [] Vomiting  [] Gastroesophageal reflux/heartburn   [] Difficulty swallowing. Genitourinary:  [x] Chronic kidney disease   [] Difficult urination  [] Frequent urination   [] Blood in urine Skin:  [] Rashes   [] Ulcers  Psychological:  [] History of anxiety   []  History of major depression.  Physical Examination  There were no vitals filed for this visit. There is no height or weight on file to calculate BMI. Gen: WD/WN, NAD Head: [place]/AT, No  temporalis wasting.  Ear/Nose/Throat: Hearing grossly intact, nares w/o erythema or drainage Eyes: PER, EOMI, sclera nonicteric.  Neck: Supple, no masses.  No bruit or JVD.  Pulmonary:  Good air movement, no audible wheezing, no use of accessory muscles.  Cardiac: RRR, normal S1, S2, no Murmurs. Vascular:  mild trophic changes, no open wounds Vessel Right Left  Radial Palpable Palpable  PT Not Palpable Not Palpable  DP Not Palpable Not Palpable  Gastrointestinal: soft, non-distended. No guarding/no peritoneal signs.  Musculoskeletal: M/S 5/5 throughout.  No  visible deformity.  Neurologic: CN 2-12 intact. Pain and light touch intact in extremities.  Symmetrical.  Speech is fluent. Motor exam as listed above. Psychiatric: Judgment intact, Mood & affect appropriate for pt's clinical situation. Dermatologic: No rashes or ulcers noted.  No changes consistent with cellulitis.   CBC Lab Results  Component Value Date   WBC 10.8 (H) 08/12/2023   HGB 11.9 (L) 08/12/2023   HCT 38.3 08/12/2023   MCV 91.4 08/12/2023   PLT 217 08/12/2023    BMET    Component Value Date/Time   NA 140 08/12/2023 0907   NA 139 07/20/2021 1324   K 4.7 08/12/2023 0907   CL 102 08/12/2023 0907   CO2 26 08/12/2023 0907   GLUCOSE 154 (H) 08/12/2023 0907   BUN 21 08/12/2023 0907   BUN 24 07/20/2021 1324   CREATININE 1.51 (H) 08/12/2023 0907   CALCIUM  8.2 (L) 08/12/2023 0907   GFRNONAA 34 (L) 08/12/2023 0907   GFRAA 49 (L) 02/10/2019 0610   Estimated Creatinine Clearance: 31.1 mL/min (A) (by C-G formula based on SCr of 1.51 mg/dL (H)).  COAG Lab Results  Component Value Date   INR 0.9 06/27/2020   INR 1.00 11/15/2017    Radiology CT Angio Chest PE W and/or Wo Contrast Result Date: 08/12/2023 CLINICAL DATA:  PE suspected, shortness of breath * Tracking Code: BO * EXAM: CT ANGIOGRAPHY CHEST WITH CONTRAST TECHNIQUE: Multidetector CT imaging of the chest was performed using the standard protocol during  bolus administration of intravenous contrast. Multiplanar CT image reconstructions and MIPs were obtained to evaluate the vascular anatomy. RADIATION DOSE REDUCTION: This exam was performed according to the departmental dose-optimization program which includes automated exposure control, adjustment of the mA and/or kV according to patient size and/or use of iterative reconstruction technique. CONTRAST:  OMNIPAQUE  IOHEXOL  350 MG/ML SOLN COMPARISON:  07/28/2023 FINDINGS: Cardiovascular: Examination for pulmonary embolism is limited by breath motion artifact in the lung bases. Within this limitation, no evidence of pulmonary embolism through the segmental pulmonary arterial level. Aortic atherosclerosis. Aortic valve stent endograft. Cardiomegaly. Three-vessel coronary artery calcifications. No pericardial effusion. Mediastinum/Nodes: No enlarged mediastinal, hilar, or axillary lymph nodes. Small hiatal hernia. Tracheobronchomalacia (series 6, image 54). Thyroid  gland and esophagus demonstrate no significant findings. Lungs/Pleura: Small bilateral pleural effusions and associated atelectasis or consolidation, new compared to prior examination. Unchanged right lower lobe mass containing a biopsy marker or fiducial (series 6, image 87). Upper Abdomen: No acute abnormality. Benign macroscopic fat containing left adrenal adenoma. Musculoskeletal: No chest wall abnormality. No acute osseous findings. Nonacute fractures of the anterolateral right ribs (series 5, image 174). Review of the MIP images confirms the above findings. IMPRESSION: 1. Examination for pulmonary embolism is limited by breath motion artifact in the lung bases. Within this limitation, no evidence of pulmonary embolism through the segmental pulmonary arterial level. 2. Small bilateral pleural effusions and associated atelectasis or consolidation, new compared to prior examination. 3. Unchanged right lower lobe mass containing a biopsy marker or  fiducial. 4. Cardiomegaly and coronary artery disease. 5. Tracheobronchomalacia. Aortic Atherosclerosis (ICD10-I70.0). Electronically Signed   By: Marolyn JONETTA Jaksch M.D.   On: 08/12/2023 11:38   DG Chest Port 1 View Result Date: 08/12/2023 CLINICAL DATA:  Shortness of breath. EXAM: PORTABLE CHEST 1 VIEW COMPARISON:  07/28/2023. FINDINGS: Stable cardiomegaly. Prosthetic aortic valve. Aortic atherosclerosis. Prior biopsy fiducial markers again noted in the right lower lung zone with similar surrounding streaky opacity, corresponding the site of known mass, better evaluated on  the prior CT. Mild left basilar atelectasis. No new focal consolidation. No sizable pleural effusion. No pneumothorax. Degenerative changes of the bilateral glenohumeral joints. No acute osseous abnormality. IMPRESSION: 1. No significant change compared to the prior exam. Prior biopsy fiducial markers again noted in the right lower lung zone with similar surrounding streaky opacity, corresponding the site of known mass better evaluated on the prior CT. 2. Mild basilar atelectasis. Electronically Signed   By: Harrietta Sherry M.D.   On: 08/12/2023 09:23   CT Angio Chest PE W and/or Wo Contrast Result Date: 07/28/2023 CLINICAL DATA:  Concern for pulmonary edema.  Shortness of breath. EXAM: CT ANGIOGRAPHY CHEST WITH CONTRAST TECHNIQUE: Multidetector CT imaging of the chest was performed using the standard protocol during bolus administration of intravenous contrast. Multiplanar CT image reconstructions and MIPs were obtained to evaluate the vascular anatomy. RADIATION DOSE REDUCTION: This exam was performed according to the departmental dose-optimization program which includes automated exposure control, adjustment of the mA and/or kV according to patient size and/or use of iterative reconstruction technique. CONTRAST:  60mL OMNIPAQUE  IOHEXOL  350 MG/ML SOLN COMPARISON:  CT dated 09/07/2022. FINDINGS: Cardiovascular: Borderline cardiomegaly. Trace  pericardial effusion. There is 3 vessel coronary vascular calcification. Aortic valve repair. Mild atherosclerotic calcification of the thoracic aorta. There is no dilatation or dissection. The origins of the great vessels of the aortic arch are patent. Small nonocclusive peripheral filling defect in the right lower lobe pulmonary artery branch (68/5) may be artifactual or represent an old embolus/scarring. A small acute PE is less likely but not excluded. Mediastinum/Nodes: No hilar or mediastinal adenopathy. The esophagus is grossly unremarkable. No mediastinal fluid collection. Lungs/Pleura: Areas of atelectasis/scarring at the left lung base. There is a 3.8 x 2.7 cm mass in the right lower lobe along the major fissure extending into the right middle lobe. This mass has increased in size since the prior CT with increase in the solid component. Prior biopsy fiducial markers noted. Trace right pleural effusion. No pneumothorax. The central airways are patent. Upper Abdomen: No acute abnormality. Musculoskeletal: Osteopenia with degenerative changes of the spine. There is nodular thickening of the anterolateral right chest wall at the level of the pulmonary nodule with old or subacute fractures of the fifth and sixth ribs. This may be postsurgical changes. However, metastatic disease and pathologic fracture is not excluded. Review of the MIP images confirms the above findings. IMPRESSION: 1. Artifact versus a small nonocclusive chronic clot/scarring in the right lower lobe pulmonary artery branch. A small acute PE is less likely. 2. Interval increase in size of the right lower lobe mass extending ac the major fissure fissure into the right middle lobe. 3. Trace right pleural effusion. 4. Postsurgical changes of the right chest wall. Metastatic disease and pathologic fracture is not excluded. 5.  Aortic Atherosclerosis (ICD10-I70.0). Electronically Signed   By: Vanetta Chou M.D.   On: 07/28/2023 14:38   DG  Chest Port 1 View Result Date: 07/28/2023 CLINICAL DATA:  Shortness of breath. EXAM: PORTABLE CHEST - 1 VIEW COMPARISON:  05/03/2023 FINDINGS: Heart is mildly enlarged.  No pulmonary vascular congestion. Interval development of bibasilar airspace opacities. Prosthetic aortic valve again seen. The advanced degenerative changes of the shoulders. IMPRESSION: 1. Unchanged mild cardiomegaly. 2. Interval development of bibasilar airspace opacities which may be due to atelectasis, pneumonia, or pulmonary edema. No Electronically Signed   By: Aliene Lloyd M.D.   On: 07/28/2023 12:39     Assessment/Plan There are no diagnoses linked to  this encounter.   Cordella Shawl, MD  08/22/2023 11:46 AM

## 2023-08-30 ENCOUNTER — Ambulatory Visit: Attending: Nurse Practitioner | Admitting: Nurse Practitioner

## 2023-08-30 ENCOUNTER — Encounter: Payer: Self-pay | Admitting: Nurse Practitioner

## 2023-08-30 VITALS — BP 138/80 | HR 64 | Ht 65.0 in | Wt 199.8 lb

## 2023-08-30 DIAGNOSIS — I480 Paroxysmal atrial fibrillation: Secondary | ICD-10-CM

## 2023-08-30 DIAGNOSIS — Z79899 Other long term (current) drug therapy: Secondary | ICD-10-CM

## 2023-08-30 DIAGNOSIS — I5043 Acute on chronic combined systolic (congestive) and diastolic (congestive) heart failure: Secondary | ICD-10-CM

## 2023-08-30 DIAGNOSIS — N1832 Chronic kidney disease, stage 3b: Secondary | ICD-10-CM

## 2023-08-30 DIAGNOSIS — N183 Chronic kidney disease, stage 3 unspecified: Secondary | ICD-10-CM

## 2023-08-30 DIAGNOSIS — I5033 Acute on chronic diastolic (congestive) heart failure: Secondary | ICD-10-CM | POA: Diagnosis not present

## 2023-08-30 DIAGNOSIS — I498 Other specified cardiac arrhythmias: Secondary | ICD-10-CM | POA: Diagnosis not present

## 2023-08-30 DIAGNOSIS — I517 Cardiomegaly: Secondary | ICD-10-CM

## 2023-08-30 DIAGNOSIS — R001 Bradycardia, unspecified: Secondary | ICD-10-CM

## 2023-08-30 DIAGNOSIS — E785 Hyperlipidemia, unspecified: Secondary | ICD-10-CM

## 2023-08-30 DIAGNOSIS — I10 Essential (primary) hypertension: Secondary | ICD-10-CM

## 2023-08-30 DIAGNOSIS — Z952 Presence of prosthetic heart valve: Secondary | ICD-10-CM

## 2023-08-30 MED ORDER — FUROSEMIDE 20 MG PO TABS: ORAL_TABLET | ORAL | 3 refills | Status: DC

## 2023-08-30 NOTE — Patient Instructions (Addendum)
 Medication Instructions:  Your physician has recommended you make the following change in your medication:  Please start Lasix  20 mg daily x 3 days, then switch to Lasix  20 mg daily as needed for leg edema, shortness of breath, or weight gain.   Labwork: In 1 week at [facility]   Testing/Procedures: None   Follow-Up: Your physician recommends that you schedule a follow-up appointment in: 2-3 Months   Any Other Special Instructions Will Be Listed Below (If Applicable).  If you need a refill on your cardiac medications before your next appointment, please call your pharmacy. HEART FAILURE INSTRUCTION SHEET  Follow a low-salt diet-you are allowed no more than 2,000 mg of sodium per day. Watch your fluid intake. In general, you should not be taking more than 64 ounces a day (no more than 8 glasses per day). Sometimes we refer to this as 2 liters per day. This includes sources of water in food like soup, coffee, tea, milk etc. Weigh yourself on the same scale at the same time of the day preferably immediately after your first void. Keep a log of your weights. Call your doctor: (Anytime you feel any of the following symptoms)  3 lbs weight gain overnight or 5 lbs within a week Shortness of breath, with or without a day hacking cough Swelling in hands, feet or stomach If you have to sleep on extra pillows at night in order to breathe   IT IS IMPORTANT TO LET YOUR DOCTOR KNOW EARLY ON IF YOU ARE HAVING SYMPTOMS SO WE CAN HELP YOU!

## 2023-08-30 NOTE — Progress Notes (Unsigned)
 Office Visit    Patient Name: Stephanie [name] Date of Encounter: 08/30/2023 PCP:  [facility]  Cardiologist:  Jayson Sierras, MD  Advanced Practice Provider:  No care team member to display Electrophysiologist:  None   Chief Complaint and HPI    Stephanie [name] is a 88 y.o. female with a hx of severe aortic stenosis, s/p TAVR in 2022, PAF, COPD, hypertension, mixed hyperlipidemia, chronic kidney disease, hx of lung cancer (receiving tx), and presumed GI bleed in January 21, type 2 diabetes, who presents today for follow-up.  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  Last seen by Dr. Sierras on October 14, 2021.  She did admit to sharp sensation of left-sided chest discomfort that prompted ER evaluation prior to office visit.  CT of chest arranged showed an lung nodule on right lower lobe was concerning for neoplasm.  Was suggested to refer patient to [facility] pulmonary for further evaluation.  I saw her for 80-month follow-up on April 12, 2022.  Was doing well. Was receiving SBRT tx for lung cancer. Had one more treatment to go. Denied any chest pain, shortness of breath, palpitations, syncope, presyncope, dizziness, orthopnea, PND, swelling or significant weight changes, acute bleeding, or claudication.   ED visit on September 07, 2022 for evaluation of pleuritic chest pain and shortness of breath.  Denied any chest trauma.  CT of chest, abdomen, and pelvis noted below.  There was nothing acute noted however there was a right lower pulmonary nodule that has substantially enlarged from previous study was now considered a mixed solid and subsolid mass with solid component.  There was no lymphadenopathy or definite signs of metastatic disease outside of the thorax at that time.  CT also revealed evidence of colonic diverticulosis, stable left adrenal nodule felt to be likely benign lesions, hepatic  steatosis.  CXR with opacities in right lower lung, felt to be atypical infection or aspiration.  Was told to follow-up with pulmonology outpatient.  Repeat bronchoscopy and biopsy were arranged.  Underwent robotic assisted bronchoscopy on September 21, 2022.  She was noted to have acute bradycardia post anesthesia.  EKG appeared like ectopic rhythm with PACs, was found to be in sinus rhythm in 60s with PACs on telemetry.  Was not found to be in heart block.  It was suspected that bradycardia was in the setting of anesthesia.  Recommended outpatient echocardiogram and follow-up with cardiology.  It was recommended that if there was no structural heart disease, could reschedule procedure.  Echocardiogram on September 23, 2022 revealed EF 55 to 60%, severe LVH, stable function of aortic valve replacement.  09/29/2022 - Presented for hospital follow-up. Was doing well. Denied any complaints. Denied any chest pain, shortness of breath, syncope, presyncope, dizziness, orthopnea, PND, swelling or significant weight changes, acute bleeding, or claudication. BP averaging 150's at home. Did sometimes sense sensation of skipping beat, brief and rare per her report. Monitor was arranged - see report below.   11/16/2022-doing well and denies any complaints.  Denies any changes to her health since I last saw her.  She presents her BP log that shows well morning BP readings, elevated evening readings.  Family member states these readings are right after she takes her medicines and not 2 hours afterwards. Denies any chest pain, shortness of breath, palpitations, syncope, presyncope, dizziness, orthopnea, PND, swelling or significant weight changes, acute bleeding, or claudication.  08/30/2023 -since I have last seen  her, she has had several ED visits in the interim. Overall doing well today. Admits to atypical pain along right side of chest. She had a CT scan done earlier this year.  CT angio chest showed artifact versus  small nonocclusive chronic clot in right lower lobe pulmonary artery branch.  Interval increase in size of right lower lobe mass, a small acute PE was felt to be less likely, trace right pleural effusion.  She does admit to some weight gain as well as two-pillow orthopnea.  Does feel like she is having peripheral edema. Denies any chest pain, shortness of breath, palpitations, syncope, presyncope, dizziness, PND, acute bleeding, or claudication.   ROS: See HPI. Rest of ROS negative.   EKGs/Labs/Other Studies Reviewed:   The following studies were reviewed today:   EKG: EKG is not ordered today.   ABI's 01/2023: Summary:  Right: Resting right ankle-brachial index indicates moderate right lower  extremity arterial disease. The right toe-brachial index is abnormal.   Left: Resting left ankle-brachial index indicates moderate left lower  extremity arterial disease. The left toe-brachial index is abnormal.  Monitor 09/2022: Predominant rhythm is sinus with prolonged PR interval.  Heart rate ranged from 41 bpm up to 101 bpm and average heart rate 67 bpm. Frequent PACs were noted representing 13.6% total beats.  There were otherwise occasional atrial couplets and rare atrial triplets. There were rare PVCs including ventricular couplets representing less than 1% total beats.  Also limited ventricular trigeminy. There were 6 episodes of NSVT, the longest of which was 8 beats. Multiple (116) episodes of PSVT were noted.  These were generally very brief, the longest of which was only 14 beats.  There were no sustained arrhythmias. No pauses or high degree heart block.  Echo 09/2022:  1. Left ventricular ejection fraction, by estimation, is 55 to 60%. The  left ventricle has normal function. The left ventricle has no regional  wall motion abnormalities. There is severe left ventricular hypertrophy.  Left ventricular diastolic parameters   are consistent with Grade I diastolic dysfunction (impaired  relaxation).  Elevated left atrial pressure.   2. Right ventricular systolic function is normal. The right ventricular  size is normal. Tricuspid regurgitation signal is inadequate for assessing  PA pressure.   3. Left atrial size was mildly dilated.   4. The mitral valve is normal in structure. No evidence of mitral valve  regurgitation. No evidence of mitral stenosis.   5. Edwards Sapien 3 Ultra THV size 26 mm is in the AV position. .The aortic valve has been repaired/replaced. Aortic valve regurgitation is not visualized. No aortic stenosis is present.   6. The inferior vena cava is normal in size with greater than 50%  respiratory variability, suggesting right atrial pressure of 3 mmHg.  Echo 06/2021:  1. Left ventricular ejection fraction, by estimation, is 50 to 55%. Left  ventricular ejection fraction by 2D MOD biplane is 51.9 %. The left  ventricle has low normal function. The left ventricle has no regional wall  motion abnormalities. There is  moderate left ventricular hypertrophy. Left ventricular diastolic  parameters are consistent with Grade I diastolic dysfunction (impaired  relaxation).   2. Right ventricular systolic function is normal. The right ventricular  size is normal. There is mildly elevated pulmonary artery systolic  pressure. The estimated right ventricular systolic pressure is 38.3 mmHg.   3. Left atrial size was moderately dilated.   4. Right atrial size was moderately dilated.   5. The mitral  valve is abnormal. Trivial mitral valve regurgitation.   6. The aortic valve has been repaired/replaced. Aortic valve  regurgitation is not visualized. There is a 26 mm Sapien prosthetic (TAVR)  valve present in the aortic position. Procedure Date: 07/01/2020. Echo  findings are consistent with normal structure  and function of the aortic valve prosthesis. Aortic valve area, by VTI  measures 1.83 cm. Aortic valve mean gradient measures 8.3 mmHg. Aortic  valve Vmax  measures 2.10 m/s.   Comparison(s): Changes from prior study are noted. 08/07/2020: LVEF 45-50%,  TAVR - mean gradient 9 mmHg.  Risk Assessment/Calculations:   CHA2DS2-VASc Score = 5  This indicates a 7.2% annual risk of stroke. The patient's score is based upon: CHF History: 0 HTN History: 1 Diabetes History: 1 Stroke History: 0 Vascular Disease History: 0 Age Score: 2 Gender Score: 1   Review of Systems    All other systems reviewed and are otherwise negative except as noted above.  Physical Exam    VS:  BP 138/80   Pulse 64   Ht 5' 5 (1.651 m)   Wt 199 lb 12.8 oz (90.6 kg)   SpO2 96%   BMI 33.25 kg/m  , BMI Body mass index is 33.25 kg/m.  Wt Readings from Last 3 Encounters:  09/03/23 197 lb 1.5 oz (89.4 kg)  09/01/23 197 lb (89.4 kg)  08/30/23 199 lb 12.8 oz (90.6 kg)     GEN: Obese, 88 y.o. female, in no acute distress. HEENT: normal. Neck: Supple, no JVD, carotid bruits, or masses. Cardiac: S1/S2, RRR, Grade 2/6 murmur, no rubs, no gallops. No clubbing, cyanosis, edema.  Radials/PT 2+ and equal bilaterally.  Respiratory:  Respirations regular and unlabored, clear to auscultation bilaterally. MS: No deformity or atrophy. Skin: Warm and dry, no rash. Neuro:  Strength and sensation are intact. Psych: Normal affect.  Assessment & Plan    Acute on chronic diastolic CHF, medication management Stage C, NYHA class I-II symptoms. EF 55-60% in 10/2022. Does show some signs and symptoms of volume overload. Will begin Lasix  20 mg daily x 3 days, then switch to Lasix  20 mg daily as needed for shortness of breath, leg edema, or weight gain, and obtain proBNP, BMET, and Mag in 1-2 weeks. Low sodium diet, fluid restriction <2L, and daily weights encouraged. Educated to contact our office for weight gain of 2 lbs overnight or 5 lbs in one week.   Bradycardia, arrhythmia Noted to have acute bradycardia while undergoing robotic assisted bronchoscopy in the past, was  suspected that her bradycardia was in the setting of anesthesia. EKG appeared like ectopic rhythm with PACs, was found to be in sinus rhythm in 60s with PACs on telemetry.  Was not found to be in heart block. Monitor report noted above without any significant arrhthymias or sustained arrhthymias. Care and ED precautions discussed.   Severe AS, s/p TAVR in 2022 TTE 09/2022 revealed stable aortic valve prosthesis function. Stable and denies any concerning symptoms. Grade 2/6 murmur noted on exam. Continue current meds. SBE prophylaxis discussed and pt verbalized understanding. Heart healthy diet and regular cardiovascular exercise as tolerated encouraged.   PAF Denies any tachycardia or palpitations.  HR well controlled. Continue current medication regimen. No longer on OAC d/t reported hx of bleeding. Continue carvedilol . Heart healthy diet and regular cardiovascular exercise as tolerated encouraged.   Left ventricular hypertrophy, HTN Most recent echo revealed severe LVH in setting of poor blood pressure control. BP well controlled in office today. SBP <  140.  Continue current medication regimen. Given BP log and discussed to monitor BP at home at least 2 hours after medications and sitting for 5-10 minutes. Heart healthy diet encouraged.   HLD Last LDL obtained in 2022 was WNL.  Currently not on any lipid-lowering medications. Will request most recent labs from PCP. Heart healthy diet and regular cardiovascular exercise as tolerated encouraged.   CKD 3 Stable kidney function seen with most recent lab work.  Avoid nephrotoxic agents.  Encourage adequate hydration.  Continue current medication regimen.  Will obtain BMET as previously mentioned. Continue follow-up with PCP.  Disposition: Follow up in 2-3 months with Jayson Sierras, MD or APP. Plan to address pre-op clearance at next office visit.    Signed, Almarie Crate, NP

## 2023-09-01 ENCOUNTER — Telehealth: Payer: Self-pay | Admitting: Internal Medicine

## 2023-09-01 ENCOUNTER — Ambulatory Visit (INDEPENDENT_AMBULATORY_CARE_PROVIDER_SITE_OTHER): Admitting: Internal Medicine

## 2023-09-01 ENCOUNTER — Encounter: Payer: Self-pay | Admitting: Internal Medicine

## 2023-09-01 VITALS — BP 191/63 | HR 76 | Ht 65.0 in | Wt 197.0 lb

## 2023-09-01 DIAGNOSIS — I10 Essential (primary) hypertension: Secondary | ICD-10-CM

## 2023-09-01 DIAGNOSIS — R911 Solitary pulmonary nodule: Secondary | ICD-10-CM | POA: Diagnosis not present

## 2023-09-01 DIAGNOSIS — J449 Chronic obstructive pulmonary disease, unspecified: Secondary | ICD-10-CM

## 2023-09-01 DIAGNOSIS — J9611 Chronic respiratory failure with hypoxia: Secondary | ICD-10-CM

## 2023-09-01 MED ORDER — VALSARTAN 80 MG PO TABS: 80.0000 mg | ORAL_TABLET | Freq: Every day | ORAL | 11 refills | Status: DC

## 2023-09-01 NOTE — Patient Instructions (Addendum)
 Stop lisinopril    Valsartan  80 mg one daily in place of lisinopril   Please remember to go to the  x-ray department  @  [facility] for your tests - we will call you with the results when they are available     Keep appt to See Dr Shelah in September and in meantime start your  water pill as instructed by cadiology   Start  02 at 2lpm  24/7    Take all medications with you to your next office visit and if get worse on the  new meds today, you will need to go to the ER at at [facility]

## 2023-09-01 NOTE — Telephone Encounter (Signed)
 Need to return if at all possible to radiology for cxr to complete the work up

## 2023-09-01 NOTE — Progress Notes (Signed)
 KIMAYA WHITLATCH, female    DOB: 08-16-1935   MRN: [id number]   Brief patient profile:  70 yobf  quit smoking 2000 with doe then  referred to pulmonary clinic in [place]  03/31/2021 by MARLA Hummer for spn     History of Present Illness  03/31/2021  Pulmonary/ 1st office eval/ [place] / [place] [facility]  Chief Complaint  Patient presents with   Consult    CT done in Feb 2023 showed pulmonary nodules.   Dyspnea:  can still do [employer] but usually uses scooter due to back  Also walks [place] slow pace s doe  Cough: none  Sleep: 2 pillows bed is flat SABA use: neb not even once a month  Rec We will schedule you for PFTs > not done as of 11/13/2021  also contact you in the future for CT @ 1 years from the last.  Call sooner for worse breathing or chest pain when you breathe in or cough that won't go away after a few weeks.   11/13/2021  f/u ov/[place] office/Makia Bossi re: Nodule f/u maint on no resp rx   Chief Complaint  Patient presents with   Follow-up    Lung nodule seen on CT   Dyspnea: rides scooter for years due to back / walks next door to sister with rollator  Cough: none  Sleeping: 2 pillows/ flat bed  SABA use: has neb, not using  02: none  Covid status: vax / never infected  Rec My office will be contacting you by phone for referral to [facility] office for PFTs and same day visit with Dr Karlis or Byrum  > dx PET pos RLL SPN> SBRT planned       04/07/2022  f/u ov/Shakendra Griffeth re: GOLD 3 copd  maint on no rx  / SBRT starting 04/08/22  Chief Complaint  Patient presents with   Follow-up    Lung nodule.  Starting chemotherapy tomorrow at [facility].  Patient states wheezing last week.  Seen at [facility]. Does not know rx given.  Dyspnea:  balance is limiting / walks with rollator to sisters house next door  Cough: none  Sleeping: bed is flat 2 pillows or loses breath SABA use: neb prn bad attacks  02: none  Rec Albuterol  2.5 mg with budesonide  0.25 mg twice  daily in nebulizer  routinely (automatically)  and up to every 4 hours if needed Please schedule a follow up visit in 3 months but call sooner if needed in [place] office     09/30/2022  f/u ov/[place] office/Marsena Taff re: GOLD 3 copd  maint on alb/bud bid and rarely  needs prn saba  Chief Complaint  Patient presents with   Follow-up    Follow up , pt was not able to do bx b/c her heart rate was dropping at the time of producer   Dyspnea:  rarely limited but very sedentary  Cough: none  Sleeping: flat bed 2 pillows s  resp cc  SABA use: rarely  02: none   Rec No change in medications Please schedule a follow up visit in 6  months but call sooner if needed    04/27/2023  f/u ov/[place] office/Aulden Calise re: GOLD 3 copd/ f/u spn   maint on prn saba   Chief Complaint  Patient presents with   Follow-up    6 month follow up  pt has no new concerns   Dyspnea:  back at [employer] pushing cart limited more by  balance than doe  Cough: none  Sleeping: flat bed 2 pillows s  resp cc  SABA use: once a week neb  02: none  Rec Also  Ok to try albuterol  before 15 min before an activity (on alternating days)  that you know would usually make you short of breath   My office will be contacting you by phone for referral to [facility] for CT chest  > not done    Pulmonary follow up is as needed     CTa  08/08/23 1. Examination for pulmonary embolism is limited by breath motion artifact in the lung bases. Within this limitation, no evidence of pulmonary embolism through the segmental pulmonary arterial level. 2. Small bilateral pleural effusions and associated atelectasis or consolidation, new compared to prior examination. 3. Unchanged right lower lobe mass containing a biopsy marker or fiducial. 4. Cardiomegaly and coronary artery disease. 5. Tracheobronchomalacia.   09/01/2023  f/u ov/[place] office/Efren Kross re: GOLD 3 copd/ f/u spn  maint on ???   Chief Complaint  Patient presents with   COPD     Er visit twice - dr said her nod has gotten bigger on her lung, shob  Dyspnea:  across the room x weeks  Cough: no excess mucus but freq throat clearing  Sleeping: bed is flat 2 pillows R side down s resp cc  SABA use: neb twice daily  02: no   No obvious day to day or daytime variability or assoc excess/ purulent sputum or mucus plugs or hemoptysis or cp or chest tightness, subjective wheeze or overt sinus or hb symptoms.    Also denies any obvious fluctuation of symptoms with weather or environmental changes or other aggravating or alleviating factors except as outlined above   No unusual exposure hx or h/o childhood pna/ asthma or knowledge of premature birth.  Current Allergies, Complete Past Medical History, Past Surgical History, Family History, and Social History were reviewed in [employer] record.  ROS  The following are not active complaints unless bolded Hoarseness, sore throat, dysphagia, dental problems, itching, sneezing,  nasal congestion or discharge of excess mucus or purulent secretions, ear ache,   fever, chills, sweats, unintended wt loss or wt gain, classically pleuritic or exertional cp,  orthopnea pnd or arm/hand swelling  or leg swelling, presyncope, palpitations, abdominal pain, anorexia, nausea, vomiting, diarrhea  or change in bowel habits or change in bladder habits, change in stools or change in urine, dysuria, hematuria,  rash, arthralgias, visual complaints, headache, numbness, weakness or ataxia or problems with walking or coordination,  change in mood or  memory.        Current Meds - - NOTE:   Unable to verify as accurately reflecting what pt takes    Medication Sig   albuterol  (PROVENTIL ) (2.5 MG/3ML) 0.083% nebulizer solution Take 3 mLs (2.5 mg total) by nebulization every 2 (two) hours as needed for wheezing or shortness of breath.   BD PEN NEEDLE NANO U/F 32G X 4 MM MISC See admin instructions.   Blood Pressure Monitor DEVI 1 each  by Does not apply route daily.   carvedilol  (COREG ) 6.25 MG tablet Take 1 tablet (6.25 mg total) by mouth 2 (two) times daily.   cinacalcet (SENSIPAR) 30 MG tablet Take 30 mg by mouth daily.   FEROSUL 325 (65 Fe) MG tablet Take 325 mg by mouth daily.   fluticasone  furoate-vilanterol (BREO ELLIPTA ) 200-25 MCG/ACT AEPB Inhale 1 puff into the lungs daily.   furosemide  (LASIX )  20 MG tablet Take 1 tablet (20 mg total) by mouth daily for 3 days, THEN 1 tablet (20 mg total) daily as needed (leg edema, shortness of breath, or weight gain.).   gabapentin  (NEURONTIN ) 100 MG capsule Take 100-200 mg by mouth at bedtime.   GNP VITAMIN D3 EXTRA STRENGTH 25 MCG (1000 UT) tablet Take 1,000 Units by mouth daily.   guaiFENesin  (MUCINEX ) 600 MG 12 hr tablet Take 1 tablet (600 mg total) by mouth 2 (two) times daily.   hydrALAZINE  (APRESOLINE ) 50 MG tablet Take 1 tablet (50 mg total) by mouth 3 (three) times daily.   isosorbide  mononitrate (IMDUR ) 30 MG 24 hr tablet Take 1 tablet (30 mg total) by mouth daily.   LANTUS SOLOSTAR 100 UNIT/ML Solostar Pen Inject 40 Units into the skin daily.   lisinopril  (ZESTRIL ) 2.5 MG tablet Take 2.5 mg by mouth daily.              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 Wonda and Dr. Lucas.   Severe aortic stenosis    Symptomatic anemia    Presumed GI bleed January 2021 - she declined GI work-up   Type 2 diabetes mellitus (HCC)         Objective:    Wts  09/01/2023       197  04/27/2023         197  09/30/2022       225   04/07/2022       189  11/13/21 181 lb 3.2 oz (82.2 kg)  10/14/21 183 lb 6.4 oz (83.2 kg)  10/13/21 225 lb (102.1 kg)     Vital signs reviewed  09/01/2023  - Note at rest 02 sats  91% on RA   General appearance:     chronically ill w/c bound  Hoarse bf  looks uncomfortable and frustated but NAD  otherwise     HEENT : Oropharynx  clear   Nasal turbinates nl    NECK :  without  apparent JVD/ palpable Nodes/TM    LUNGS: no acc muscle use,  Min barrel  contour chest wall with bilateral  slightly decreased bs esp R base posteriorly with dullness  and  without cough on insp or exp maneuvers   CV:  RRR  no s3 or  2-3/6 SEM sincrease in P2, and no edema   ABD:  soft and nontender with pos end  insp Hoover's  in the supine position.  No bruits or organomegaly appreciated   MS  ext warm without deformities Or obvious joint restrictions  calf tenderness, cyanosis or clubbing     SKIN: warm and dry without lesions    NEURO:  alert, approp, nl sensorium with  no motor or cerebellar deficits apparent.         Cxr 09/01/2023 > did not go as requested,> done 08/19/23  1. Interval increase opacification within the right lung base, possibly representing atelectasis or airspace disease. 2. Small bilateral pleural effusions. 3. Cardiac enlargement, status post TAVR, and aortic atherosclerotic calcification.   BNP   08/12/23 = 363 (vs 400 on 7/210/25)   I personally reviewed images and agree with radiology impression as follows:   Chest CTa  7/25/225  1. Examination for pulmonary embolism is limited by breath motion artifact in the lung bases. Within this limitation, no evidence of pulmonary embolism through the segmental pulmonary arterial level. 2. Small bilateral pleural effusions and associated atelectasis or consolidation, new compared to prior examination. 3. Unchanged right lower lobe mass containing a biopsy marker or fiducial. 4. Cardiomegaly and coronary artery disease. 5. Tracheobronchomalacia.       Assessment     Assessment & Plan COPD GOLD 3 if use FEV1/VC Quit smoking 2000 - PFT's  02/05/22  FEV1 0.91 (47 % ) ratio 0.70  (with shortened Texp)  p 3 % improvement from saba p 0 prior to  study with DLCO  22 (115%)   and FV curve slt concave  with fev1/svc = 56% - 04/07/2022  After extensive coaching inhaler device,  effectiveness =    0% so try adding bud 0.25 mg to albuterol  bid and prn  - 09/01/2023 newly 02 dep with walking 09/01/2023 > see chronic resp failure   Not clear why so much worse sob but really hasn't tried her saba neb much yet ? Whether having reaction to ACEi or more CHF than apparent but needs to complete the w/u with CXR and try off ACEi (see hbp) with f/u cards recs   The pleural effusion are small but Note that pleural effusion and copd have the same effect on insp muscles/mechanics (both shorten their length prior to inspiration making them weaker with less force reserve) so they are synergistic in causing sob.   Essential hypertension, benign D/C ACEi 09/01/2023 due to hoarseness / dry cough   In the best review of chronic cough to date ( NEJM 2016 375 [id number]) ,  ACEi are now felt to cause cough in up to  20% of pts which is a 4 fold increase from previous reports and does not include the variety of non-specific complaints we see in pulmonary clinic in pts on ACEi but previously attributed to another dx like  Copd/asthma and  include PNDS, throat and chest congestion, bronchitis, unexplained dyspnea and noct strangling sensations, and hoarseness, but also  atypical /refractory GERD symptoms like dysphagia and bad heartburn   The only way I know  to prove this is not an ACEi Case is a trial off ACEi x a minimum of 6 weeks then regroup.   >>> try valsartan  40 mg one daily and f/u  as planned with DR Byrum 09/21/23    Solitary pulmonary nodule on lung CT Quit smoking around 2000 - PET  04/22/20 Low metabolic activity associated with the RIGHT upper lobe nodule is not typical of bronchogenic carcinoma; however, low-grade adenocarcinoma can exhibit low metabolic activity. As lesion is increased in size/density from 2015 recommend tissue samplin - CTa  03/05/21 1.6 x 0.8 cm mixed semi-solid nodule with irregular and ground-glass margins located in the anterior aspect of the right lower lobe no change since 11/19/20 so rec f/u in one year >  Placed in reminder file  - CT  10/30/21  1.7 x 1.0 cm irregular ill-defined nodule in the anterior right lower lobe, contiguous with the fissure has increased minimally in size since the prior study but qualitatively appears more confluent today. Imaging features are highly suspicious for neoplasm - FOB / Icard 03/02/22 atypical cells > SRT completed   >>>  f/u with DR Byrum planned for 09/21/23 ? Need for refob based on CT chest 08/12/23 ? Recurrence or assoc effusion   Chronic respiratory  failure with hypoxia (HCC) 09/01/2023   Walked on RA  x 3  lap(s) =  approx 450  ft  @ slow pace, stopped due to desats to  with lowest 02 sats 87%  > corrected to 89%  on 2lpm   Rx 2lpm 24/7   Each maintenance medication was reviewed in detail including emphasizing most importantly the difference between maintenance and prns and under what circumstances the prns are to be triggered using an action plan format where appropriate.  Total time for H and P, chart review, counseling, reviewing hfa/neb/02  device(s) , directly observing portions of ambulatory 02 saturation study/ and generating customized AVS unique to this office visit / same day charting = 40 min   for multiple  refractory respiratory  symptoms of uncertain etiology           Patient Instructions  Stop lisinopril    Valsartan  80 mg one daily in place of lisinopril   Please remember to go to the  x-ray department  @  [facility] for your tests - we will call you with the results when they are available     Keep appt to See Dr Shelah in September and in meantime start your  water pill as instructed by cadiology   Start  02 at 2lpm  24/7    Take all medications with you to your next office visit and if get worse on the  new meds today, you will  need to go to the ER at at [facility], MD 09/10/2023

## 2023-09-02 NOTE — Telephone Encounter (Signed)
 Called and spoke with pt informing her that we would need her to complete her xray to finish her workup. Confirmed understanding nfn

## 2023-09-03 ENCOUNTER — Emergency Department (HOSPITAL_COMMUNITY)

## 2023-09-03 ENCOUNTER — Encounter (HOSPITAL_COMMUNITY): Payer: Self-pay | Admitting: Emergency Medicine

## 2023-09-03 ENCOUNTER — Inpatient Hospital Stay (HOSPITAL_COMMUNITY)
Admission: EM | Admit: 2023-09-03 | Discharge: 2023-09-08 | DRG: 193 | Disposition: A | Discharge status: Home / Self Care | Attending: Family Medicine | Admitting: Family Medicine

## 2023-09-03 ENCOUNTER — Other Ambulatory Visit: Payer: Self-pay

## 2023-09-03 DIAGNOSIS — R001 Bradycardia, unspecified: Secondary | ICD-10-CM | POA: Diagnosis not present

## 2023-09-03 DIAGNOSIS — I130 Hypertensive heart and chronic kidney disease with heart failure and stage 1 through stage 4 chronic kidney disease, or unspecified chronic kidney disease: Secondary | ICD-10-CM | POA: Diagnosis present

## 2023-09-03 DIAGNOSIS — I480 Paroxysmal atrial fibrillation: Secondary | ICD-10-CM | POA: Diagnosis present

## 2023-09-03 DIAGNOSIS — I10 Essential (primary) hypertension: Secondary | ICD-10-CM | POA: Diagnosis present

## 2023-09-03 DIAGNOSIS — E875 Hyperkalemia: Secondary | ICD-10-CM | POA: Diagnosis not present

## 2023-09-03 DIAGNOSIS — Z952 Presence of prosthetic heart valve: Secondary | ICD-10-CM

## 2023-09-03 DIAGNOSIS — R531 Weakness: Secondary | ICD-10-CM | POA: Diagnosis present

## 2023-09-03 DIAGNOSIS — J440 Chronic obstructive pulmonary disease with acute lower respiratory infection: Secondary | ICD-10-CM | POA: Diagnosis present

## 2023-09-03 DIAGNOSIS — J189 Pneumonia, unspecified organism: Principal | ICD-10-CM | POA: Diagnosis present

## 2023-09-03 DIAGNOSIS — Z8249 Family history of ischemic heart disease and other diseases of the circulatory system: Secondary | ICD-10-CM

## 2023-09-03 DIAGNOSIS — E785 Hyperlipidemia, unspecified: Secondary | ICD-10-CM | POA: Diagnosis not present

## 2023-09-03 DIAGNOSIS — Z880 Allergy status to penicillin: Secondary | ICD-10-CM

## 2023-09-03 DIAGNOSIS — Z882 Allergy status to sulfonamides status: Secondary | ICD-10-CM

## 2023-09-03 DIAGNOSIS — R911 Solitary pulmonary nodule: Secondary | ICD-10-CM | POA: Diagnosis present

## 2023-09-03 DIAGNOSIS — Z833 Family history of diabetes mellitus: Secondary | ICD-10-CM | POA: Diagnosis not present

## 2023-09-03 DIAGNOSIS — R0602 Shortness of breath: Secondary | ICD-10-CM | POA: Diagnosis present

## 2023-09-03 DIAGNOSIS — Z825 Family history of asthma and other chronic lower respiratory diseases: Secondary | ICD-10-CM

## 2023-09-03 DIAGNOSIS — Z87891 Personal history of nicotine dependence: Secondary | ICD-10-CM | POA: Diagnosis not present

## 2023-09-03 DIAGNOSIS — E66812 Obesity, class 2: Secondary | ICD-10-CM | POA: Diagnosis present

## 2023-09-03 DIAGNOSIS — Z6832 Body mass index (BMI) 32.0-32.9, adult: Secondary | ICD-10-CM

## 2023-09-03 DIAGNOSIS — N1832 Chronic kidney disease, stage 3b: Secondary | ICD-10-CM | POA: Diagnosis present

## 2023-09-03 DIAGNOSIS — Z888 Allergy status to other drugs, medicaments and biological substances status: Secondary | ICD-10-CM

## 2023-09-03 DIAGNOSIS — I509 Heart failure, unspecified: Secondary | ICD-10-CM | POA: Diagnosis present

## 2023-09-03 DIAGNOSIS — Z79899 Other long term (current) drug therapy: Secondary | ICD-10-CM | POA: Diagnosis not present

## 2023-09-03 DIAGNOSIS — J9601 Acute respiratory failure with hypoxia: Principal | ICD-10-CM | POA: Diagnosis present

## 2023-09-03 DIAGNOSIS — E1169 Type 2 diabetes mellitus with other specified complication: Secondary | ICD-10-CM | POA: Diagnosis present

## 2023-09-03 DIAGNOSIS — Z794 Long term (current) use of insulin: Secondary | ICD-10-CM | POA: Diagnosis not present

## 2023-09-03 DIAGNOSIS — E1122 Type 2 diabetes mellitus with diabetic chronic kidney disease: Secondary | ICD-10-CM | POA: Diagnosis present

## 2023-09-03 DIAGNOSIS — J441 Chronic obstructive pulmonary disease with (acute) exacerbation: Secondary | ICD-10-CM | POA: Diagnosis present

## 2023-09-03 DIAGNOSIS — E1165 Type 2 diabetes mellitus with hyperglycemia: Secondary | ICD-10-CM | POA: Diagnosis present

## 2023-09-03 DIAGNOSIS — E782 Mixed hyperlipidemia: Secondary | ICD-10-CM | POA: Diagnosis present

## 2023-09-03 DIAGNOSIS — T380X5A Adverse effect of glucocorticoids and synthetic analogues, initial encounter: Secondary | ICD-10-CM | POA: Diagnosis not present

## 2023-09-03 LAB — BASIC METABOLIC PANEL WITH GFR
Anion gap: 9 (ref 5–15)
BUN: 26 mg/dL — ABNORMAL HIGH (ref 8–23)
CO2: 26 mmol/L (ref 22–32)
Calcium: 7.9 mg/dL — ABNORMAL LOW (ref 8.9–10.3)
Chloride: 104 mmol/L (ref 98–111)
Creatinine, Ser: 1.59 mg/dL — ABNORMAL HIGH (ref 0.44–1.00)
GFR, Estimated: 32 mL/min — ABNORMAL LOW (ref >60)
Glucose, Bld: 69 mg/dL — ABNORMAL LOW (ref 70–99)
Potassium: 5.1 mmol/L (ref 3.5–5.1)
Sodium: 139 mmol/L (ref 135–145)

## 2023-09-03 LAB — CBC
HCT: 34.9 % — ABNORMAL LOW (ref 36.0–46.0)
Hemoglobin: 10.9 g/dL — ABNORMAL LOW (ref 12.0–15.0)
MCH: 28.5 pg (ref 26.0–34.0)
MCHC: 31.2 g/dL (ref 30.0–36.0)
MCV: 91.4 fL (ref 80.0–100.0)
Platelets: 233 K/uL (ref 150–400)
RBC: 3.82 MIL/uL — ABNORMAL LOW (ref 3.87–5.11)
RDW: 14.4 % (ref 11.5–15.5)
WBC: 9.3 K/uL (ref 4.0–10.5)
nRBC: 0 % (ref 0.0–0.2)

## 2023-09-03 LAB — BRAIN NATRIURETIC PEPTIDE: B Natriuretic Peptide: 581 pg/mL — ABNORMAL HIGH (ref 0.0–100.0)

## 2023-09-03 MED ORDER — ENOXAPARIN SODIUM 30 MG/0.3ML IJ SOSY
30.0000 mg | PREFILLED_SYRINGE | INTRAMUSCULAR | Status: DC
  Administered 2023-09-03 – 2023-09-07 (×5): 30 mg via SUBCUTANEOUS
  Filled 2023-09-03 (×5): qty 0.3

## 2023-09-03 MED ORDER — IPRATROPIUM-ALBUTEROL 0.5-2.5 (3) MG/3ML IN SOLN
3.0000 mL | Freq: Once | RESPIRATORY_TRACT | Status: AC
  Administered 2023-09-03: 3 mL via RESPIRATORY_TRACT
  Filled 2023-09-03: qty 3

## 2023-09-03 MED ORDER — ACETAMINOPHEN 325 MG PO TABS
650.0000 mg | ORAL_TABLET | Freq: Four times a day (QID) | ORAL | Status: DC | PRN
  Administered 2023-09-04 – 2023-09-07 (×2): 650 mg via ORAL
  Filled 2023-09-03 (×2): qty 2

## 2023-09-03 MED ORDER — METHYLPREDNISOLONE SODIUM SUCC 125 MG IJ SOLR
125.0000 mg | Freq: Once | INTRAMUSCULAR | Status: AC
  Administered 2023-09-03: 125 mg via INTRAVENOUS
  Filled 2023-09-03: qty 2

## 2023-09-03 MED ORDER — ONDANSETRON HCL 4 MG PO TABS: 4.0000 mg | ORAL_TABLET | Freq: Four times a day (QID) | ORAL | Status: DC | PRN

## 2023-09-03 MED ORDER — ACETAMINOPHEN 650 MG RE SUPP
650.0000 mg | Freq: Four times a day (QID) | RECTAL | Status: DC | PRN
Start: 2023-09-03 — End: 2023-09-08

## 2023-09-03 MED ORDER — GABAPENTIN 100 MG PO CAPS
100.0000 mg | ORAL_CAPSULE | Freq: Every day | ORAL | Status: DC
  Administered 2023-09-03 – 2023-09-07 (×5): 100 mg via ORAL
  Filled 2023-09-03 (×5): qty 1

## 2023-09-03 MED ORDER — ALBUTEROL SULFATE (2.5 MG/3ML) 0.083% IN NEBU
10.0000 mg | INHALATION_SOLUTION | RESPIRATORY_TRACT | Status: AC
  Administered 2023-09-03: 10 mg via RESPIRATORY_TRACT
  Filled 2023-09-03: qty 12

## 2023-09-03 MED ORDER — IPRATROPIUM-ALBUTEROL 0.5-2.5 (3) MG/3ML IN SOLN
3.0000 mL | Freq: Three times a day (TID) | RESPIRATORY_TRACT | Status: DC
  Administered 2023-09-04 (×3): 3 mL via RESPIRATORY_TRACT
  Filled 2023-09-03 (×3): qty 3

## 2023-09-03 MED ORDER — ENOXAPARIN SODIUM 40 MG/0.4ML IJ SOSY: 40.0000 mg | PREFILLED_SYRINGE | INTRAMUSCULAR | Status: DC

## 2023-09-03 MED ORDER — FLUTICASONE FUROATE-VILANTEROL 100-25 MCG/ACT IN AEPB
1.0000 | INHALATION_SPRAY | Freq: Every day | RESPIRATORY_TRACT | Status: DC
  Administered 2023-09-03 – 2023-09-08 (×6): 1 via RESPIRATORY_TRACT
  Filled 2023-09-03: qty 28

## 2023-09-03 MED ORDER — HYDRALAZINE HCL 50 MG PO TABS
50.0000 mg | ORAL_TABLET | Freq: Three times a day (TID) | ORAL | Status: DC
  Administered 2023-09-03 – 2023-09-08 (×15): 50 mg via ORAL
  Filled 2023-09-03 (×15): qty 1

## 2023-09-03 MED ORDER — FUROSEMIDE 10 MG/ML IJ SOLN
40.0000 mg | INTRAMUSCULAR | Status: AC
  Administered 2023-09-03: 40 mg via INTRAVENOUS
  Filled 2023-09-03: qty 4

## 2023-09-03 MED ORDER — ISOSORBIDE MONONITRATE ER 30 MG PO TB24
30.0000 mg | ORAL_TABLET | Freq: Every day | ORAL | Status: DC
  Administered 2023-09-04 – 2023-09-08 (×5): 30 mg via ORAL
  Filled 2023-09-03 (×5): qty 1

## 2023-09-03 MED ORDER — IPRATROPIUM-ALBUTEROL 0.5-2.5 (3) MG/3ML IN SOLN
3.0000 mL | Freq: Four times a day (QID) | RESPIRATORY_TRACT | Status: DC
  Administered 2023-09-03: 3 mL via RESPIRATORY_TRACT
  Filled 2023-09-03 (×2): qty 3

## 2023-09-03 MED ORDER — ONDANSETRON HCL 4 MG/2ML IJ SOLN: 4.0000 mg | Freq: Four times a day (QID) | INTRAMUSCULAR | Status: DC | PRN

## 2023-09-03 MED ORDER — METHYLPREDNISOLONE SODIUM SUCC 40 MG IJ SOLR
40.0000 mg | INTRAMUSCULAR | Status: DC
  Administered 2023-09-03 – 2023-09-07 (×4): 40 mg via INTRAVENOUS
  Filled 2023-09-03 (×4): qty 1

## 2023-09-03 MED ORDER — ALBUTEROL SULFATE (2.5 MG/3ML) 0.083% IN NEBU
2.5000 mg | INHALATION_SOLUTION | RESPIRATORY_TRACT | Status: DC | PRN
  Administered 2023-09-05: 2.5 mg via RESPIRATORY_TRACT
  Filled 2023-09-03: qty 3

## 2023-09-03 MED ORDER — FERROUS SULFATE 325 (65 FE) MG PO TABS
325.0000 mg | ORAL_TABLET | Freq: Every day | ORAL | Status: DC
  Administered 2023-09-04 – 2023-09-08 (×5): 325 mg via ORAL
  Filled 2023-09-03 (×5): qty 1

## 2023-09-03 NOTE — Plan of Care (Signed)

## 2023-09-03 NOTE — Assessment & Plan Note (Signed)
 Continue blood pressure control with hydralazine  and isosorbide .  Resume carvedilol .

## 2023-09-03 NOTE — Assessment & Plan Note (Addendum)
 Renal function with serum cr at 1,73 with K at 4,7 and serum bicarbonate at 26  Na 137   Follow up renal function and electrolytes in am.

## 2023-09-03 NOTE — Progress Notes (Signed)
 Patient has a small spot to her buttocks from old boil. Is healed and dry.

## 2023-09-03 NOTE — Assessment & Plan Note (Signed)
Calculated BMI is 32.8

## 2023-09-03 NOTE — ED Notes (Signed)
 O2 removed by EDP

## 2023-09-03 NOTE — Plan of Care (Signed)
   Problem: Education: Goal: Knowledge of General Education information will improve Description: Including pain rating scale, medication(s)/side effects and non-pharmacologic comfort measures Outcome: Progressing   Problem: Safety: Goal: Ability to remain free from injury will improve Outcome: Progressing   Problem: Skin Integrity: Goal: Risk for impaired skin integrity will decrease Outcome: Progressing

## 2023-09-03 NOTE — ED Notes (Signed)
 Ambulated pt without o2 on a pulse ox. Pt denied sob. However breathing appeared labored and o2 sat averaged around 88 dropping as low as 84 towards the end of the trial. Pt is back in bed and o2 has returned to 92%

## 2023-09-03 NOTE — H&P (Signed)
 History and Physical    Patient: Stephanie [name] FMW:[id number] DOB: 1936-01-02 DOA: 09/03/2023 DOS: the patient was seen and examined on 09/03/2023 PCP: [facility]  Patient coming from: Home  Chief Complaint:  Chief Complaint  Patient presents with   Shortness of Breath   HPI: Stephanie [name] is a 88 y.o. female with medical history significant of asthma, COPD, CKD, paroxymal atrial fibrillation, aortic stenosis sp TAVR, chronic anemia and T2DM who presented with dyspnea.  08/14 outpatient pulmonary follow up. Reported 3 to 4 days of worsening dyspnea on exertion, generalized weakness and cough. She has chronic orthopnea 2 to 3 pillows and denies PND or peripheral edema.  Her dyspnea rapidly worsened, to the point where today had wheezing and increased work of breathing. She called EMS, and was found in respiratory distress, using accessory respiratory muscles and having 02 saturation of 90% on room air. She received albuterol  neb and was transported to the ED.   At the time of my examination her dyspnea is improving but not yet back to baseline. She continue to have 02 desaturation on ambulation on room air, 84 to 88%.     Review of Systems: As mentioned in the history of present illness. All other systems reviewed and are negative. 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 Wonda and Dr. Lucas.   Severe aortic stenosis    Symptomatic anemia    Presumed GI bleed January 2021 - she declined GI work-up   Type 2 diabetes mellitus [facility])    Past Surgical History:  Procedure Laterality Date   APPENDECTOMY      BACK SURGERY     BRONCHIAL BIOPSY  03/02/2022   Procedure: BRONCHIAL BIOPSIES;  Surgeon: Brenna Adine LITTIE, DO;  Location: [facility] ENDOSCOPY;  Service: Pulmonary;;   BRONCHIAL BRUSHINGS  03/02/2022   Procedure: BRONCHIAL BRUSHINGS;  Surgeon: Brenna Adine LITTIE, DO;  Location: [facility] ENDOSCOPY;  Service: Pulmonary;;   BRONCHIAL NEEDLE ASPIRATION BIOPSY  03/02/2022   Procedure: BRONCHIAL NEEDLE ASPIRATION BIOPSIES;  Surgeon: Brenna Adine LITTIE, DO;  Location: [facility] ENDOSCOPY;  Service: Pulmonary;;   CYST REMOVAL HAND     Right, hospital in [place]   FIDUCIAL MARKER PLACEMENT  03/02/2022   Procedure: FIDUCIAL MARKER PLACEMENT;  Surgeon: Brenna Adine LITTIE, DO;  Location: [facility] ENDOSCOPY;  Service: Pulmonary;;   INTRAOPERATIVE TRANSTHORACIC ECHOCARDIOGRAM Left 07/01/2020   Procedure: INTRAOPERATIVE TRANSTHORACIC ECHOCARDIOGRAM;  Surgeon: Wonda Sharper, MD;  Location[facility] OR;  Service: Open Heart Surgery;  Laterality: Left;   KNEE ARTHROSCOPY WITH MEDIAL MENISECTOMY Right 02/28/2012   Procedure: KNEE ARTHROSCOPY WITH MEDIAL MENISECTOMY;  Surgeon: Taft FORBES Minerva, MD;  Location: [facility] ORS;  Service: Orthopedics;  Laterality: Right;   REPAIR KNEE LIGAMENT     RIGHT/LEFT HEART CATH AND CORONARY ANGIOGRAPHY N/A 04/10/2020   Procedure: RIGHT/LEFT HEART CATH AND CORONARY ANGIOGRAPHY;  Surgeon: Dann Candyce RAMAN, MD;  Location: [facility] INVASIVE CV LAB;  Service: Cardiovascular;  Laterality: N/A;   TRANSCATHETER AORTIC VALVE REPLACEMENT, TRANSFEMORAL N/A 07/01/2020   Procedure: TRANSCATHETER AORTIC VALVE REPLACEMENT, TRANSFEMORAL;  Surgeon: Wonda Sharper, MD;  Location: [facility] OR;  Service: Open Heart Surgery;  Laterality: N/A;   ULTRASOUND GUIDANCE FOR VASCULAR ACCESS Bilateral 07/01/2020   Procedure: ULTRASOUND GUIDANCE FOR VASCULAR ACCESS;  Surgeon: Wonda Sharper, MD;  Location: [facility] OR;  Service: Open Heart Surgery;  Laterality: Bilateral;   Social History:  reports that she has quit smoking. Her smoking use included cigarettes. She has  never been exposed to tobacco smoke. She has never used smokeless tobacco. She reports that she does not drink alcohol and does not use drugs.  Allergies  Allergen Reactions   Amoxicillin Swelling    Tongue swelling    Penicillins Swelling   Sulfa Antibiotics Swelling    Lip swelling   Nitrofurantoin Nausea And Vomiting    Family History  Problem Relation Age of Onset   Lung disease Other    Cancer Other    Arthritis Other    Asthma Other    Diabetes Other    Diabetes Mother    Hypertension Mother    Diverticulitis Mother    Diabetes Father    Hypertension Sister    Diabetes Brother    Colon cancer Neg Hx     Prior to Admission medications   Medication Sig Start Date End Date Taking? Authorizing Provider  carvedilol  (COREG ) 6.25 MG tablet Take 6.25 mg by mouth 2 (two) times daily with a meal.   Yes [provider]  albuterol  (PROVENTIL ) (2.5 MG/3ML) 0.083% nebulizer solution Take 3 mLs (2.5 mg total) by nebulization every 2 (two) hours as needed for wheezing or shortness of breath. 08/12/23   Pearlean, Courage, MD  cinacalcet (SENSIPAR) 30 MG tablet Take 30 mg by mouth daily. 03/31/23   [provider]  FEROSUL 325 (65 Fe) MG tablet Take 325 mg by mouth daily.    [provider]  furosemide  (LASIX ) 20 MG tablet Take 1 tablet (20 mg total) by mouth daily for 3 days, THEN 1 tablet (20 mg total) daily as needed (leg edema, shortness of breath, or weight gain.). 08/30/23 12/01/23  Miriam Norris, NP  gabapentin  (NEURONTIN ) 100 MG capsule Take 100-200 mg by mouth at bedtime. 10/02/19   [provider]  GNP VITAMIN D3 EXTRA STRENGTH 25 MCG (1000 UT) tablet Take 1,000 Units by mouth daily. 03/31/23   [provider]  guaiFENesin  (MUCINEX ) 600 MG 12 hr tablet Take 1 tablet (600 mg total) by mouth 2 (two) times daily. 08/12/23 08/11/24  Pearlean Manus, MD  hydrALAZINE  (APRESOLINE ) 50 MG tablet Take 1 tablet (50 mg total) by mouth 3 (three) times  daily. 08/12/23   Pearlean Manus, MD  isosorbide  mononitrate (IMDUR ) 30 MG 24 hr tablet Take 1 tablet (30 mg total) by mouth daily. 08/12/23 08/11/24  Pearlean Manus, MD  LANTUS SOLOSTAR 100 UNIT/ML Solostar Pen Inject 40 Units into the skin daily.    [provider]  valsartan  (DIOVAN ) 80 MG tablet Take 1 tablet (80 mg total) by mouth daily. 09/01/23   Darlean Sharper NOVAK, MD    Physical Exam: Vitals:   09/03/23 1030 09/03/23 1037 09/03/23 1100 09/03/23 1103  BP: (!) 145/57     Pulse: (!) 56  (!) 52   Resp: 20  (!) 25   Temp: 98.2 F (36.8 C)     TempSrc: Oral     SpO2: 90% 93% 92% 92%  Weight:      Height:       BP (!) 168/83   Pulse 73   Temp 98 F (36.7 C) (Oral)  Resp 20   Ht 5' 5 (1.651 m)   Wt 89.4 kg   SpO2 91%   BMI 32.80 kg/m   Neurology awake and alert ENT with mild pallor Cardiovascular with S1 and S2 present and regular with no gallops, or rubs, positive systolic murmur at the right lower sternal border Respiratory with prolonged expiratory phase, distant breath sounds and expiratory wheezing Abdomen with no distention, soft and non tender No lower extremity edema   Data Reviewed:   Na 139, K 5.1 Cl 104 bicarbonate 26 glucose 69, bun 26 cr 1,59 BNP 581  Wbc 9,3 hgb 10.9 plt 233   Chest radiograph with left rotation, hypoinflation, positive cardiomegaly, positive right lower lobe infiltrate with small pleural effusion.  EKG 53 bpm, normal axis, normal intervals, qtc 433, sinus rhythm with no significant ST segment or  T wave changes.   Assessment and Plan: * COPD with acute exacerbation (HCC) Right lower lobe pneumonia, community acquired and present on admission  07/25 CT chest with no pulmonary embolism through the segmental pulmonary artery level. Small bilateral pleural effusions and associated atelectasis or consolidation.  Unchanged right lower lobe mass, containing a biopsy marker or fiducial.  Plan to continue bronchodilator therapy  scheduled and as needed Add LABA and ICD Systemic corticosteroids with IV methylprednisolone   Airway clearing techniques with flutter valve and incentive spirometer  Antibiotic therapy with ceftriaxone  IV and azithromycin  po Follow up on cultures, cell count and temperature curve.   Essential hypertension, benign Continue blood pressure control with hydralazine  and isosorbide    CKD stage 3b, GFR 30-44 ml/min (HCC) Follow up renal function and electrolytes  Hold on further diuresis Avoid hypotension and nephrotoxic medications   Type 2 diabetes mellitus with hyperlipidemia (HCC) Add insulin  sliding scale for glucose cover and monitoring   Obesity, class 2 Calculated BMI is 32.8       Advance Care Planning:   Code Status: Full Code   Consults: none   Family Communication: no family at the bedside   Severity of Illness: The appropriate patient status for this patient is INPATIENT. Inpatient status is judged to be reasonable and necessary in order to provide the required intensity of service to ensure the patient's safety. The patient's presenting symptoms, physical exam findings, and initial radiographic and laboratory data in the context of their chronic comorbidities is felt to place them at high risk for further clinical deterioration. Furthermore, it is not anticipated that the patient will be medically stable for discharge from the hospital within 2 midnights of admission.   * I certify that at the point of admission it is my clinical judgment that the patient will require inpatient hospital care spanning beyond 2 midnights from the point of admission due to high intensity of service, high risk for further deterioration and high frequency of surveillance required.*  Author: Elidia Toribio Furnace, MD 09/03/2023 12:50 PM  For on call review [email or web address].

## 2023-09-03 NOTE — ED Provider Notes (Signed)
 [place] EMERGENCY DEPARTMENT AT [facility] Provider Note   CSN: [id number] Arrival date & time: 09/03/23  1017     Patient presents with: Shortness of Breath   Stephanie [name] is a 88 y.o. female.    Shortness of Breath    This patient is an 88 year old female, she is treated by Dr. Darlean with pulmonology, she has a known history of stage IIIb chronic kidney disease, she is known to have a pulmonary nodule COPD Gold and essential hypertension.  The patient was last seen in the emergency department and admitted to the hospital on July 25, she had a CT scan showing no pulmonary embolism, severe LVH and an ejection fraction of 55 to 60% on an echocardiogram from 2024, she had improved in the emergency department and did not require admission to the hospital at that time.  The patient comes back today with some recurrent shortness of breath stating that this happens from time to time.  Reviewing the medical history it appears that the patient has been seen in emergency department every couple of months for similar findings.  She denies fevers, no significant coughing, no swelling of the legs.  She is not on home oxygen   Prior to Admission medications   Medication Sig Start Date End Date Taking? Authorizing Provider  albuterol  (PROVENTIL ) (2.5 MG/3ML) 0.083% nebulizer solution Take 3 mLs (2.5 mg total) by nebulization every 2 (two) hours as needed for wheezing or shortness of breath. 08/12/23   Pearlean Manus, MD  BD PEN NEEDLE NANO U/F 32G X 4 MM MISC See admin instructions. 03/30/22   [provider]  Blood Pressure Monitor DEVI 1 each by Does not apply route daily. 09/29/22   Miriam Norris, NP  carvedilol  (COREG ) 6.25 MG tablet Take 1 tablet (6.25 mg total) by mouth 2 (two) times daily. 08/12/23   Pearlean Manus, MD  cinacalcet (SENSIPAR) 30 MG tablet Take 30 mg by mouth daily. 03/31/23   [provider]  FEROSUL 325 (65 Fe) MG tablet Take 325 mg by mouth  daily.    [provider]  furosemide  (LASIX ) 20 MG tablet Take 1 tablet (20 mg total) by mouth daily for 3 days, THEN 1 tablet (20 mg total) daily as needed (leg edema, shortness of breath, or weight gain.). 08/30/23 12/01/23  Miriam Norris, NP  gabapentin  (NEURONTIN ) 100 MG capsule Take 100-200 mg by mouth at bedtime. 10/02/19   [provider]  GNP VITAMIN D3 EXTRA STRENGTH 25 MCG (1000 UT) tablet Take 1,000 Units by mouth daily. 03/31/23   [provider]  guaiFENesin  (MUCINEX ) 600 MG 12 hr tablet Take 1 tablet (600 mg total) by mouth 2 (two) times daily. 08/12/23 08/11/24  Pearlean Manus, MD  hydrALAZINE  (APRESOLINE ) 50 MG tablet Take 1 tablet (50 mg total) by mouth 3 (three) times daily. 08/12/23   Pearlean Manus, MD  HYDROcodone -acetaminophen  (NORCO/VICODIN) 5-325 MG tablet Take 1 tablet by mouth every 6 (six) hours as needed for moderate pain (pain score 4-6) or severe pain (pain score 7-10). Patient not taking: Reported on 09/01/2023 04/08/23   Arlon Carliss ORN, DO  isosorbide  mononitrate (IMDUR ) 30 MG 24 hr tablet Take 1 tablet (30 mg total) by mouth daily. 08/12/23 08/11/24  Pearlean Manus, MD  LANTUS SOLOSTAR 100 UNIT/ML Solostar Pen Inject 40 Units into the skin daily.    [provider]  valsartan  (DIOVAN ) 80 MG tablet Take 1 tablet (80 mg total) by mouth daily. 09/01/23   Darlean Sharper  B, MD    Allergies: Amoxicillin, Penicillins, Sulfa antibiotics, and Nitrofurantoin    Review of Systems  Respiratory:  Positive for shortness of breath.   All other systems reviewed and are negative.   Updated Vital Signs BP (!) 145/57 (BP Location: Left Arm)   Pulse (!) 52   Temp 98.2 F (36.8 C) (Oral)   Resp (!) 25   Ht 1.651 m (5' 5)   Wt 89.4 kg   SpO2 92%   BMI 32.78 kg/m   Physical Exam Vitals and nursing note reviewed.  Constitutional:      General: She is not in acute distress.    Appearance: She is well-developed.  HENT:     Head:  Normocephalic and atraumatic.     Mouth/Throat:     Pharynx: No oropharyngeal exudate.  Eyes:     General: No scleral icterus.       Right eye: No discharge.        Left eye: No discharge.     Conjunctiva/sclera: Conjunctivae normal.     Pupils: Pupils are equal, round, and reactive to light.  Neck:     Thyroid : No thyromegaly.     Vascular: No JVD.  Cardiovascular:     Rate and Rhythm: Normal rate and regular rhythm.     Heart sounds: Normal heart sounds. No murmur heard.    No friction rub (.milm). No gallop.  Pulmonary:     Effort: Pulmonary effort is normal. No respiratory distress.     Breath sounds: Wheezing present. No rales.     Comments: Speaks in full sentences but has a slight prolonged expiratory phase, expiratory wheezing in all lung fields, no rales, no coughing during the exam, no edema of the legs Abdominal:     General: Bowel sounds are normal. There is no distension.     Palpations: Abdomen is soft. There is no mass.     Tenderness: There is no abdominal tenderness.  Musculoskeletal:        General: No tenderness. Normal range of motion.     Cervical back: Normal range of motion and neck supple.  Lymphadenopathy:     Cervical: No cervical adenopathy.  Skin:    General: Skin is warm and dry.     Findings: No erythema or rash.  Neurological:     Mental Status: She is alert.     Coordination: Coordination normal.  Psychiatric:        Behavior: Behavior normal.     (all labs ordered are listed, but only abnormal results are displayed) Labs Reviewed  BASIC METABOLIC PANEL WITH GFR - Abnormal; Notable for the following components:      Result Value   Glucose, Bld 69 (*)    BUN 26 (*)    Creatinine, Ser 1.59 (*)    Calcium  7.9 (*)    GFR, Estimated 32 (*)    All other components within normal limits  CBC - Abnormal; Notable for the following components:   RBC 3.82 (*)    Hemoglobin 10.9 (*)    HCT 34.9 (*)    All other components within normal limits   BRAIN NATRIURETIC PEPTIDE - Abnormal; Notable for the following components:   B Natriuretic Peptide 581.0 (*)    All other components within normal limits    EKG: EKG Interpretation Date/Time:  Saturday September 03 2023 10:36:07 EDT Ventricular Rate:  53 PR Interval:    QRS Duration:  87 QT Interval:  461 QTC Calculation: 433  R Axis:   -56  Text Interpretation: no charge, duplicate Confirmed by Cleotilde Rogue ([id number]) on 09/03/2023 10:55:32 AM  Radiology: DG Chest 2 View Result Date: 09/03/2023 EXAM: 2 VIEW(S) XRAY OF THE CHEST 09/03/2023 10:58:00 AM COMPARISON: 08/12/2023 CLINICAL HISTORY: SOB. Per chart: Pt via EMS from home c/o SOB with wheezing and accessory muscle use. Hx COPD. Albuterol  given en route. O2 90% RA on scene, increased to 96% after albuterol . Lungs clear bilaterally. FINDINGS: LUNGS AND PLEURA: Interval increase opacification within the right lung base, which may represent atelectasis or airspace disease. Small bilateral pleural effusions identified. HEART AND MEDIASTINUM: Cardiac enlargement. Status post TAVR. Aortic atherosclerotic calcification. BONES AND SOFT TISSUES: No acute osseous abnormality. IMPRESSION: 1. Interval increase opacification within the right lung base, possibly representing atelectasis or airspace disease. 2. Small bilateral pleural effusions. 3. Cardiac enlargement, status post TAVR, and aortic atherosclerotic calcification. Electronically signed by: Waddell Calk MD 09/03/2023 11:19 AM EDT RP Workstation: [id number]     .Critical Care  Performed by: Cleotilde Rogue, MD Authorized by: Cleotilde Rogue, MD   Critical care provider statement:    Critical care time (minutes):  45   Critical care time was exclusive of:  Separately billable procedures and treating other patients and teaching time   Critical care was necessary to treat or prevent imminent or life-threatening deterioration of the following conditions:  Respiratory failure   Critical care was  time spent personally by me on the following activities:  Development of treatment plan with patient or surrogate, discussions with consultants, evaluation of patient's response to treatment, examination of patient, obtaining history from patient or surrogate, review of old charts, re-evaluation of patient's condition, pulse oximetry, ordering and review of radiographic studies, ordering and review of laboratory studies and ordering and performing treatments and interventions   I assumed direction of critical care for this patient from another provider in my specialty: no     Care discussed with: admitting provider   Comments:          Medications Ordered in the ED  furosemide  (LASIX ) injection 40 mg (has no administration in time range)  albuterol  (PROVENTIL ,VENTOLIN ) solution continuous neb (has no administration in time range)  ipratropium-albuterol  (DUONEB) 0.5-2.5 (3) MG/3ML nebulizer solution 3 mL (3 mLs Nebulization Given 09/03/23 1103)  methylPREDNISolone  sodium succinate (SOLU-MEDROL ) 125 mg/2 mL injection 125 mg (125 mg Intravenous Given 09/03/23 1101)                                    Medical Decision Making Amount and/or Complexity of Data Reviewed Labs: ordered. Radiology: ordered.  Risk Prescription drug management.   This patient presents with increasing shortness of breath   This patient presents to the ED for concern of shortness of breath, this involves an extensive number of treatment options, and is a complaint that carries with it a high risk of complications and morbidity.  The differential diagnosis includes COPD exacerbation, pneumonia, pneumothorax as likely causes, doubt pulmonary embolism or acute coronary syndrome.  EKG is unremarkable, labs and chest x-ray ordered   Co morbidities / Chronic conditions that complicate the patient evaluation  Obesity, COPD   Additional history obtained:  Additional history obtained from EMR External records from  outside source obtained and reviewed including medical record including prior echocardiograms and admissions to the hospital   Lab Tests:  I Ordered, and personally interpreted labs.  The pertinent results include: Basic  metabolic panel essentially unremarkable, chronic renal insufficiency, CBC with anemia, seems to be chronic and unchanged, BNP of 581, this number has been trending up over time and is twice as high as it was 2 years ago.   Imaging Studies ordered:  I ordered imaging studies including chest x-ray I independently visualized and interpreted imaging which showed increased opacification at the base, atelectasis, bilateral pleural effusions I agree with the radiologist interpretation   Cardiac Monitoring: / EKG:  The patient was maintained on a cardiac monitor.  I personally viewed and interpreted the cardiac monitored which showed an underlying rhythm of: Sinus rhythm   Problem List / ED Course / Critical interventions / Medication management  This patient has increasing shortness of breath, she was given medications as below I ordered medication including methylprednisolone , DuoNeb, Lasix  Reevaluation of the patient after these medicines showed that the patient still decompensated when she tried to ambulate with oxygen  is dropping to 87% and becoming very dyspneic. Continuous nebs ordered as well as furosemide  Suspect this is a combination of worsening congestive heart failure with COPD as well.  She is not febrile and has no leukocytosis to suggest that this is infectious I have reviewed the patients home medicines and have made adjustments as needed   Consultations Obtained:  I requested consultation with the hospitalist,  and discussed lab and imaging findings as well as pertinent plan - they recommend: Admission   Social Determinants of Health:  Respiratory failure   Test / Admission - Considered:  Mental hospital      Final diagnoses:  Acute  respiratory failure with hypoxia (HCC)  Acute congestive heart failure, unspecified heart failure type [facility])  COPD exacerbation [facility])    ED Discharge Orders     None          Cleotilde Rogue, MD 09/03/23 1242

## 2023-09-03 NOTE — ED Triage Notes (Signed)
 Pt via EMS from home c/o SOB with wheezing and accessory muscle use. Hx COPD. Albuterol  given en route.  O2 90% RA on scene, increased to 96% after albuterol . Lungs clear bilaterally.   eTCO2 25-26 O2 96% RA CBG 95 T 97.5 BP 145/60 HR 62

## 2023-09-03 NOTE — Assessment & Plan Note (Addendum)
 Uncontrolled hyperglycemia with fasting glucose 212 this am, likely steroid induced hyperglycemia.   Continue insulin  sliding scale for glucose cover and monitoring

## 2023-09-03 NOTE — Assessment & Plan Note (Addendum)
 Right lower lobe pneumonia, community acquired and present on admission Possible post obstructive pneumonia  Acute hypoxemic respiratory failure.   07/25 CT chest with no pulmonary embolism through the segmental pulmonary artery level. Small bilateral pleural effusions and associated atelectasis or consolidation.  Unchanged right lower lobe mass, containing a biopsy marker or fiducial.  Wbc is 9,5 and she has been afebrile.   Plan to continue bronchodilator therapy scheduled and as needed Continue with LABA and ICD Systemic corticosteroids with IV methylprednisolone  40 mg daily.  Airway clearing techniques with flutter valve and incentive spirometer  Antibiotic therapy with ceftriaxone  IV and azithromycin  po Follow up on cultures, cell count and temperature curve.  Patient will need follow up with pulmonary as outpatient.

## 2023-09-04 DIAGNOSIS — J441 Chronic obstructive pulmonary disease with (acute) exacerbation: Secondary | ICD-10-CM | POA: Diagnosis not present

## 2023-09-04 DIAGNOSIS — I10 Essential (primary) hypertension: Secondary | ICD-10-CM | POA: Diagnosis not present

## 2023-09-04 DIAGNOSIS — E1169 Type 2 diabetes mellitus with other specified complication: Secondary | ICD-10-CM | POA: Diagnosis not present

## 2023-09-04 DIAGNOSIS — N1832 Chronic kidney disease, stage 3b: Secondary | ICD-10-CM | POA: Diagnosis not present

## 2023-09-04 LAB — BASIC METABOLIC PANEL WITH GFR
Anion gap: 10 (ref 5–15)
Anion gap: 10 (ref 5–15)
BUN: 35 mg/dL — ABNORMAL HIGH (ref 8–23)
BUN: 41 mg/dL — ABNORMAL HIGH (ref 8–23)
CO2: 26 mmol/L (ref 22–32)
CO2: 26 mmol/L (ref 22–32)
Calcium: 8.1 mg/dL — ABNORMAL LOW (ref 8.9–10.3)
Calcium: 8.3 mg/dL — ABNORMAL LOW (ref 8.9–10.3)
Chloride: 101 mmol/L (ref 98–111)
Chloride: 100 mmol/L (ref 98–111)
Creatinine, Ser: 2 mg/dL — ABNORMAL HIGH (ref 0.44–1.00)
Creatinine, Ser: 2 mg/dL — ABNORMAL HIGH (ref 0.44–1.00)
GFR, Estimated: 24 mL/min — ABNORMAL LOW (ref >60)
GFR, Estimated: 24 mL/min — ABNORMAL LOW (ref >60)
Glucose, Bld: 212 mg/dL — ABNORMAL HIGH (ref 70–99)
Glucose, Bld: 120 mg/dL — ABNORMAL HIGH (ref 70–99)
Potassium: 5.8 mmol/L — ABNORMAL HIGH (ref 3.5–5.1)
Potassium: 4.8 mmol/L (ref 3.5–5.1)
Sodium: 137 mmol/L (ref 135–145)
Sodium: 136 mmol/L (ref 135–145)

## 2023-09-04 LAB — CBC
HCT: 34.1 % — ABNORMAL LOW (ref 36.0–46.0)
Hemoglobin: 10.4 g/dL — ABNORMAL LOW (ref 12.0–15.0)
MCH: 28.3 pg (ref 26.0–34.0)
MCHC: 30.5 g/dL (ref 30.0–36.0)
MCV: 92.7 fL (ref 80.0–100.0)
Platelets: 250 K/uL (ref 150–400)
RBC: 3.68 MIL/uL — ABNORMAL LOW (ref 3.87–5.11)
RDW: 14.3 % (ref 11.5–15.5)
WBC: 9.5 K/uL (ref 4.0–10.5)
nRBC: 0 % (ref 0.0–0.2)

## 2023-09-04 MED ORDER — SODIUM ZIRCONIUM CYCLOSILICATE 10 G PO PACK
10.0000 g | PACK | Freq: Once | ORAL | Status: AC
  Administered 2023-09-04: 10 g via ORAL
  Filled 2023-09-04: qty 1

## 2023-09-04 MED ORDER — IPRATROPIUM-ALBUTEROL 0.5-2.5 (3) MG/3ML IN SOLN
3.0000 mL | Freq: Two times a day (BID) | RESPIRATORY_TRACT | Status: DC
  Administered 2023-09-05 – 2023-09-08 (×7): 3 mL via RESPIRATORY_TRACT
  Filled 2023-09-04 (×6): qty 3

## 2023-09-04 NOTE — Plan of Care (Signed)

## 2023-09-04 NOTE — TOC Initial Note (Signed)
 Transition of Care [facility]) - Initial/Assessment Note    Patient Details  Name: Stephanie [name] MRN: [id number] Date of Birth: 01/31/1935  Transition of Care [facility] CM/SW Contact:    Nena LITTIE Coffee, RN Phone Number: 09/04/2023, 7:30 PM  Clinical Narrative:                 Pt from home, lives c/sister, no services, no needs at this time. Admitted c/COPD exacerbation. Assessed for high readmission score.   Expected Discharge Plan: Home/Self Care Barriers to Discharge: Continued Medical Work up   Patient Goals and CMS Choice            Expected Discharge Plan and Services In-house Referral: Clinical Social Work Discharge Planning Services: CM Consult   Living arrangements for the past 2 months: Single Family Home                                      Prior Living Arrangements/Services Living arrangements for the past 2 months: Single Family Home Lives with:: Siblings Patient language and need for interpreter reviewed:: Yes Do you feel safe going back to the place where you live?: Yes      Need for Family Participation in Patient Care: Yes (Comment) Care giver support system in place?: Yes (comment)   Criminal Activity/Legal Involvement Pertinent to Current Situation/Hospitalization: No - Comment as needed  Activities of Daily Living   ADL Screening (condition at time of admission) Independently performs ADLs?: Yes (appropriate for developmental age) Is the patient deaf or have difficulty hearing?: No Does the patient have difficulty seeing, even when wearing glasses/contacts?: No Does the patient have difficulty concentrating, remembering, or making decisions?: No  Permission Sought/Granted                  Emotional Assessment Appearance:: Appears stated age Attitude/Demeanor/Rapport: Engaged Affect (typically observed): Appropriate Orientation: : Oriented to Self, Oriented to Place, Oriented to  Time, Oriented to Situation Alcohol / Substance Use:  Not Applicable Psych Involvement: No (comment)  Admission diagnosis:  COPD exacerbation (HCC) [J44.1] Acute respiratory failure with hypoxia (HCC) [J96.01] Heart failure (HCC) [I50.9] Acute congestive heart failure, unspecified heart failure type (HCC) [I50.9] Patient Active Problem List   Diagnosis Date Noted   Heart failure (HCC) 09/03/2023   COPD with acute exacerbation (HCC) 09/03/2023   Rt LL Lung mass 08/12/2023   AKI (acute kidney injury) (HCC) 04/07/2023   CKD stage 3b, GFR 30-44 ml/min (HCC) 04/07/2023   Atherosclerosis of artery of extremity with intermittent claudication (HCC) 09/15/2022   COPD GOLD 3 if use FEV1/VC 03/21/2022   Lung nodule 02/09/2022   Diverticulitis 07/02/2021   Solitary pulmonary nodule on lung CT 03/31/2021   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   DOE (dyspnea on exertion) 04/08/2020   Asthma 04/08/2020   DM (diabetes mellitus) (HCC) 04/08/2020   Severe aortic stenosis 04/08/2020   Symptomatic anemia 02/09/2019   Vitamin B12 deficiency    Type 2 diabetes mellitus with hyperlipidemia (HCC) 07/06/2017   Essential hypertension, benign 07/06/2017   Mixed hyperlipidemia 07/06/2017   Obesity, class 2 07/06/2017   Medial meniscus, posterior horn derangement 04/12/2012   OA (osteoarthritis) of knee 04/12/2012   PCP:  [facility] Pharmacy:   [facility] - [place], [place] - [address] [place] [address] Phone: [phone] Fax: [phone]  Social Drivers of Health (SDOH) Social History: SDOH Screenings   Food Insecurity: No Food Insecurity (09/03/2023)  Housing: Low Risk  (09/03/2023)  Transportation Needs: No Transportation Needs (09/03/2023)  Utilities: Not At Risk (09/03/2023)  Depression (PHQ2-9): Low Risk  (03/12/2022)  Social Connections: Moderately Integrated (09/03/2023)  Tobacco Use: Medium Risk (09/03/2023)   SDOH Interventions:      Readmission Risk Interventions    09/04/2023    7:28 PM  Readmission Risk Prevention Plan  Transportation Screening Complete  PCP or Specialist Appt within 3-5 Days Complete  HRI or Home Care Consult Complete  Social Work Consult for Recovery Care Planning/Counseling Complete  Palliative Care Screening Not Applicable  Medication Review Oceanographer) Complete

## 2023-09-04 NOTE — Evaluation (Signed)
 Physical Therapy Evaluation Patient Details Name: Stephanie [name] MRN: [id number] DOB: 10-Aug-1935 Today's Date: 09/04/2023  History of Present Illness  Stephanie [name] is a 88 y.o. female with medical history significant of asthma, COPD, CKD, paroxymal atrial fibrillation, aortic stenosis sp TAVR, chronic anemia and T2DM who presented with dyspnea.   08/14 outpatient pulmonary follow up.  Reported 3 to 4 days of worsening dyspnea on exertion, generalized weakness and cough. She has chronic orthopnea 2 to 3 pillows and denies PND or peripheral edema.   Her dyspnea rapidly worsened, to the point where today had wheezing and increased work of breathing. She called EMS, and was found in respiratory distress, using accessory respiratory muscles and having 02 saturation of 90% on room air.  She received albuterol  neb and was transported to the ED.      At the time of my examination her dyspnea is improving but not yet back to baseline. She continue to have 02 desaturation on ambulation on room air, 84 to 88%.   Clinical Impression  Patient demonstrates good return for bed mobility, transferring to/from commode in bathroom and walking in hallway using RW. Patient on room air with SpO2 at 95% during ambulation - MD notified. Patient will benefit from continued skilled physical therapy in hospital and recommended venue below to increase strength, balance, endurance for safe ADLs and gait.         If plan is discharge home, recommend the following: A little help with walking and/or transfers;A little help with bathing/dressing/bathroom;Help with stairs or ramp for entrance;Assistance with cooking/housework   Can travel by private vehicle        Equipment Recommendations None recommended by PT  Recommendations for Other Services       Functional Status Assessment Patient has had a recent decline in their functional status and demonstrates the ability to make significant improvements in function in a  reasonable and predictable amount of time.     Precautions / Restrictions Precautions Precautions: Fall Recall of Precautions/Restrictions: Intact Restrictions Weight Bearing Restrictions Per Provider Order: No      Mobility  Bed Mobility Overal bed mobility: Independent                  Transfers Overall transfer level: Needs assistance Equipment used: Rolling walker (2 wheels), None Transfers: Sit to/from Stand, Bed to chair/wheelchair/BSC Sit to Stand: Supervision   Step pivot transfers: Supervision       General transfer comment: slightly unsteady with occasional stumbling without AD, safer using RW    Ambulation/Gait Ambulation/Gait assistance: Supervision Gait Distance (Feet): 80 Feet Assistive device: Rolling walker (2 wheels) Gait Pattern/deviations: Decreased step length - left, Decreased stance time - right, Decreased stride length, Trunk flexed Gait velocity: slightly decreased     General Gait Details: slightly labored movement with good return for ambulating in room, hallway using RW without loss of balance  Stairs            Wheelchair Mobility     Tilt Bed    Modified Rankin (Stroke Patients Only)       Balance Overall balance assessment: Needs assistance Sitting-balance support: Feet supported, No upper extremity supported Sitting balance-Leahy Scale: Good Sitting balance - Comments: seated at EOB   Standing balance support: During functional activity, No upper extremity supported Standing balance-Leahy Scale: Fair Standing balance comment: fair/good using RW  Pertinent Vitals/Pain Pain Assessment Pain Assessment: No/denies pain    Home Living Family/patient expects to be discharged to:: Private residence Living Arrangements: Children;Other relatives Available Help at Discharge: Family;Available 24 hours/day Type of Home: Mobile home Home Access: Stairs to enter Entrance  Stairs-Rails: Doctor, general practice of Steps: 4   Home Layout: One level Home Equipment: Cane - single point;Rollator (4 wheels)      Prior Function Prior Level of Function : Independent/Modified Independent             Mobility Comments: household and short distanced community ambulation using RW PRN ADLs Comments: Independent for most household ADLS and assisted by family     Extremity/Trunk Assessment   Upper Extremity Assessment Upper Extremity Assessment: Defer to OT evaluation    Lower Extremity Assessment Lower Extremity Assessment: Generalized weakness    Cervical / Trunk Assessment Cervical / Trunk Assessment: Normal  Communication   Communication Communication: No apparent difficulties    Cognition Arousal: Alert Behavior During Therapy: WFL for tasks assessed/performed   PT - Cognitive impairments: No apparent impairments                         Following commands: Intact       Cueing Cueing Techniques: Verbal cues     General Comments      Exercises     Assessment/Plan    PT Assessment Patient needs continued PT services  PT Problem List Decreased strength;Decreased activity tolerance;Decreased balance;Decreased mobility       PT Treatment Interventions DME instruction;Gait training;Stair training;Functional mobility training;Therapeutic activities;Therapeutic exercise;Balance training;Patient/family education    PT Goals (Current goals can be found in the Care Plan section)  Acute Rehab PT Goals Patient Stated Goal: return home with family to assist PT Goal Formulation: With patient Time For Goal Achievement: 09/07/23 Potential to Achieve Goals: Good    Frequency Min 2X/week     Co-evaluation               AM-PAC PT 6 Clicks Mobility  Outcome Measure Help needed turning from your back to your side while in a flat bed without using bedrails?: None Help needed moving from lying on your back to  sitting on the side of a flat bed without using bedrails?: None Help needed moving to and from a bed to a chair (including a wheelchair)?: None Help needed standing up from a chair using your arms (e.g., wheelchair or bedside chair)?: None Help needed to walk in hospital room?: A Little Help needed climbing 3-5 steps with a railing? : A Little 6 Click Score: 22    End of Session   Activity Tolerance: Patient tolerated treatment well;Patient limited by fatigue Patient left: in bed;with call bell/phone within reach Nurse Communication: Mobility status PT Visit Diagnosis: Unsteadiness on feet (R26.81);Other abnormalities of gait and mobility (R26.89);Muscle weakness (generalized) (M62.81)    Time: 8971-8956 PT Time Calculation (min) (ACUTE ONLY): 15 min   Charges:   PT Evaluation $PT Eval Low Complexity: 1 Low PT Treatments $Therapeutic Activity: 8-22 mins PT General Charges $$ ACUTE PT VISIT: 1 Visit         1:45 PM, 09/04/23 Lynwood Music, MPT Physical Therapist with [facility] 336 [phone] office [phone] mobile phone

## 2023-09-04 NOTE — Plan of Care (Signed)
  Problem: Acute Rehab PT Goals(only PT should resolve) Goal: Pt Will Go Supine/Side To Sit Outcome: Progressing Flowsheets (Taken 09/04/2023 1346) Pt will go Supine/Side to Sit: Independently Goal: Patient Will Transfer Sit To/From Stand Outcome: Progressing Flowsheets (Taken 09/04/2023 1346) Patient will transfer sit to/from stand: with modified independence Goal: Pt Will Transfer Bed To Chair/Chair To Bed Outcome: Progressing Flowsheets (Taken 09/04/2023 1346) Pt will Transfer Bed to Chair/Chair to Bed: with modified independence Goal: Pt Will Ambulate Outcome: Progressing Flowsheets (Taken 09/04/2023 1346) Pt will Ambulate:  > 125 feet  with modified independence  with rolling walker   1:47 PM, 09/04/23 Lynwood Music, MPT Physical Therapist with [facility] [phone] office [phone] mobile phone

## 2023-09-04 NOTE — Hospital Course (Signed)
 Stephanie [name] was admitted to the hospital with the working diagnosis of community acquired pneumonia.   88 y.o. female with medical history significant of asthma, COPD, CKD, paroxymal atrial fibrillation, aortic stenosis sp TAVR, chronic anemia and T2DM who presented with dyspnea.  08/14 outpatient pulmonary follow up, set up for right lower lobe biopsy.  Reported 3 to 4 days of worsening dyspnea on exertion, generalized weakness and cough. She has chronic orthopnea 2 to 3 pillows and denies PND or peripheral edema.  Her dyspnea rapidly worsened, to the point where she had wheezing and increased work of breathing. She called EMS, and was found in respiratory distress, using accessory respiratory muscles and having 02 saturation of 90% on room air. She received albuterol  neb and was transported to the ED.  On her initial physical examination her blood pressure was 145/57, HR 56, RR 20 and 02 saturation 92% on supplemental 02 per [place]  Her 02 desaturation on ambulation on room air, 84 to 88%.   Na 139, K 5.1 Cl 104 bicarbonate 26 glucose 69, bun 26 cr 1,59 BNP 581  Wbc 9,3 hgb 10.9 plt 233    Chest radiograph with left rotation, hypoinflation, positive cardiomegaly, positive right lower lobe infiltrate with small pleural effusion.   EKG 53 bpm, normal axis, normal intervals, qtc 433, sinus rhythm with no significant ST segment or  T wave changes.   Patient was placed on antibiotic therapy, supplemental 02 per [place] and bronchodilator therapy.   08/17 clinically improving, 02 saturation 96% on 2 L/min per [place].  08/18 not yet back to baseline.

## 2023-09-04 NOTE — Progress Notes (Signed)
   09/04/23 1341  Vitals  Temp 98 F (36.7 C)  Temp Source Oral  BP (!) 162/64  MAP (mmHg) 95  BP Location Left Arm  BP Method Automatic  Patient Position (if appropriate) Sitting  Pulse Rate 64  Pulse Rate Source Dinamap  Resp 18  MEWS COLOR  MEWS Score Color Green  Oxygen  Therapy  SpO2 100 %  O2 Device Room Air  MEWS Score  MEWS Temp 0  MEWS Systolic 0  MEWS Pulse 0  MEWS RR 0  MEWS LOC 0  MEWS Score 0   Called to patient's room for c/o chest pain. Pt just finishing nebulizer treatment, sitting on side of bed. Breath sounds with insp and exp wheezes bilaterally, diminished bases. Non-prod cough. HR with RRR apically. When asked to point to area of chest that is hurting, pt points to right upper quad abd, states, right here over them nodules I have. Describes as an aching, hurting pain, states woke her up from her nap prior to the neb being admin'd. Skin warm and dry, color appropriate, peripheral pulses +2, VSS as above. Pt states, Can I finish eating my lunch? Pt allowed to resume eating meal. Offered Tyelnol for pain, pt states, Yes, please.

## 2023-09-04 NOTE — Progress Notes (Signed)
 Progress Note   Patient: Stephanie [name] FMW:[id number] DOB: 11-Jun-1935 DOA: 09/03/2023     1 DOS: the patient was seen and examined on 09/04/2023   Brief hospital course: [name] was admitted to the hospital with the working diagnosis of community acquired pneumonia.   88 y.o. female with medical history significant of asthma, COPD, CKD, paroxymal atrial fibrillation, aortic stenosis sp TAVR, chronic anemia and T2DM who presented with dyspnea.  08/14 outpatient pulmonary follow up, set up for right lower lobe biopsy.  Reported 3 to 4 days of worsening dyspnea on exertion, generalized weakness and cough. She has chronic orthopnea 2 to 3 pillows and denies PND or peripheral edema.  Her dyspnea rapidly worsened, to the point where she had wheezing and increased work of breathing. She called EMS, and was found in respiratory distress, using accessory respiratory muscles and having 02 saturation of 90% on room air. She received albuterol  neb and was transported to the ED.  On her initial physical examination her blood pressure was 145/57, HR 56, RR 20 and 02 saturation 92% on supplemental 02 per [place]  Her 02 desaturation on ambulation on room air, 84 to 88%.   Na 139, K 5.1 Cl 104 bicarbonate 26 glucose 69, bun 26 cr 1,59 BNP 581  Wbc 9,3 hgb 10.9 plt 233    Chest radiograph with left rotation, hypoinflation, positive cardiomegaly, positive right lower lobe infiltrate with small pleural effusion.   EKG 53 bpm, normal axis, normal intervals, qtc 433, sinus rhythm with no significant ST segment or  T wave changes.   Patient was placed on antibiotic therapy, supplemental 02 per [place] and bronchodilator therapy.   08/17 clinically improving, 02 saturation 96% on 2 L/min per [place].   Assessment and Plan: * COPD with acute exacerbation (HCC) Right lower lobe pneumonia, community acquired and present on admission Possible post obstructive pneumonia  Acute hypoxemic respiratory failure.   07/25  CT chest with no pulmonary embolism through the segmental pulmonary artery level. Small bilateral pleural effusions and associated atelectasis or consolidation.  Unchanged right lower lobe mass, containing a biopsy marker or fiducial.  Wbc is 9,5 and she has been afebrile.   Plan to continue bronchodilator therapy scheduled and as needed Continue with LABA and ICD Systemic corticosteroids with IV methylprednisolone  40 mg daily.  Airway clearing techniques with flutter valve and incentive spirometer  Antibiotic therapy with ceftriaxone  IV and azithromycin  po Follow up on cultures, cell count and temperature curve.  Patient will need follow up with pulmonary as outpatient.   Essential hypertension, benign Continue blood pressure control with hydralazine  and isosorbide    CKD stage 3b, GFR 30-44 ml/min (HCC) Follow up renal function with serum cr at 2.0 with K at 5,8 and serum bicarbonate at 26  Na 137   Add one dose of sodium zirconium and check bmp this pm.    Type 2 diabetes mellitus with hyperlipidemia (HCC) Uncontrolled hyperglycemia with fasting glucose 212 this am, likely steroid induced hyperglycemia.   Continue insulin  sliding scale for glucose cover and monitoring   Obesity, class 2 Calculated BMI is 32.8       Subjective: Patient with improvement on dyspnea, close to her baseline, no chest pain, no PND, orthopnea or peripheral edema   Physical Exam: Vitals:   09/03/23 2108 09/04/23 0411 09/04/23 0733 09/04/23 0741  BP: (!) 162/77 (!) 159/65    Pulse: 72 66    Resp: 18 20    Temp: 98.6 F (37 C)  97.6 F (36.4 C)    TempSrc: Oral Oral    SpO2: 96% 97% 96% 96%  Weight:      Height:       Neurology awake and alert ENT with mild pallor with no icterus Cardiovascular with S1 and S2 present and regular with no gallops, rubs or murmurs Respiratory with distant breath sounds, prolonged expiratory phase and rales at the right base with no wheezing or rhonchi  Abdomen  protuberant but not distended, not tender No lower extremity edema  Data Reviewed:    Family Communication: no family at the bedside   Disposition: Status is: Inpatient Remains inpatient appropriate because: IV antibiotics   Planned Discharge Destination: Home     Author: Elidia Toribio Furnace, MD 09/04/2023 11:51 AM  For on call review [email or web address].

## 2023-09-05 DIAGNOSIS — N1832 Chronic kidney disease, stage 3b: Secondary | ICD-10-CM | POA: Diagnosis not present

## 2023-09-05 DIAGNOSIS — E1169 Type 2 diabetes mellitus with other specified complication: Secondary | ICD-10-CM | POA: Diagnosis not present

## 2023-09-05 DIAGNOSIS — I10 Essential (primary) hypertension: Secondary | ICD-10-CM | POA: Diagnosis not present

## 2023-09-05 DIAGNOSIS — J441 Chronic obstructive pulmonary disease with (acute) exacerbation: Secondary | ICD-10-CM | POA: Diagnosis not present

## 2023-09-05 LAB — CBC
HCT: 34.6 % — ABNORMAL LOW (ref 36.0–46.0)
Hemoglobin: 10.6 g/dL — ABNORMAL LOW (ref 12.0–15.0)
MCH: 28 pg (ref 26.0–34.0)
MCHC: 30.6 g/dL (ref 30.0–36.0)
MCV: 91.5 fL (ref 80.0–100.0)
Platelets: 257 K/uL (ref 150–400)
RBC: 3.78 MIL/uL — ABNORMAL LOW (ref 3.87–5.11)
RDW: 14.4 % (ref 11.5–15.5)
WBC: 11.8 K/uL — ABNORMAL HIGH (ref 4.0–10.5)
nRBC: 0 % (ref 0.0–0.2)

## 2023-09-05 LAB — BASIC METABOLIC PANEL WITH GFR
Anion gap: 8 (ref 5–15)
BUN: 43 mg/dL — ABNORMAL HIGH (ref 8–23)
CO2: 26 mmol/L (ref 22–32)
Calcium: 8.3 mg/dL — ABNORMAL LOW (ref 8.9–10.3)
Chloride: 103 mmol/L (ref 98–111)
Creatinine, Ser: 1.73 mg/dL — ABNORMAL HIGH (ref 0.44–1.00)
GFR, Estimated: 29 mL/min — ABNORMAL LOW (ref >60)
Glucose, Bld: 136 mg/dL — ABNORMAL HIGH (ref 70–99)
Potassium: 4.7 mmol/L (ref 3.5–5.1)
Sodium: 137 mmol/L (ref 135–145)

## 2023-09-05 MED ORDER — CARVEDILOL 3.125 MG PO TABS
6.2500 mg | ORAL_TABLET | Freq: Two times a day (BID) | ORAL | Status: DC
  Administered 2023-09-05 – 2023-09-06 (×3): 6.25 mg via ORAL
  Filled 2023-09-05 (×3): qty 2

## 2023-09-05 NOTE — Progress Notes (Signed)
 Nurse at bedside, alerted by [facility] telemetry patient's hr was in the 30's.Blood pressure 158/88,hr 56,Dr Toribio Furnace notified.No c/o pain or discomfort noted. Plan of care on going.

## 2023-09-05 NOTE — Progress Notes (Signed)
 Progress Note   Patient: Stephanie [name] FMW:[id number] DOB: 1935/08/24 DOA: 09/03/2023     2 DOS: the patient was seen and examined on 09/05/2023   Brief hospital course: Mrs. Zentz was admitted to the hospital with the working diagnosis of community acquired pneumonia.   88 y.o. female with medical history significant of asthma, COPD, CKD, paroxymal atrial fibrillation, aortic stenosis sp TAVR, chronic anemia and T2DM who presented with dyspnea.  08/14 outpatient pulmonary follow up, set up for right lower lobe biopsy.  Reported 3 to 4 days of worsening dyspnea on exertion, generalized weakness and cough. She has chronic orthopnea 2 to 3 pillows and denies PND or peripheral edema.  Her dyspnea rapidly worsened, to the point where she had wheezing and increased work of breathing. She called EMS, and was found in respiratory distress, using accessory respiratory muscles and having 02 saturation of 90% on room air. She received albuterol  neb and was transported to the ED.  On her initial physical examination her blood pressure was 145/57, HR 56, RR 20 and 02 saturation 92% on supplemental 02 per [place]  Her 02 desaturation on ambulation on room air, 84 to 88%.   Na 139, K 5.1 Cl 104 bicarbonate 26 glucose 69, bun 26 cr 1,59 BNP 581  Wbc 9,3 hgb 10.9 plt 233    Chest radiograph with left rotation, hypoinflation, positive cardiomegaly, positive right lower lobe infiltrate with small pleural effusion.   EKG 53 bpm, normal axis, normal intervals, qtc 433, sinus rhythm with no significant ST segment or  T wave changes.   Patient was placed on antibiotic therapy, supplemental 02 per [place] and bronchodilator therapy.   08/17 clinically improving, 02 saturation 96% on 2 L/min per [place].  08/18 not yet back to baseline.   Assessment and Plan: * COPD with acute exacerbation (HCC) Right lower lobe pneumonia, community acquired and present on admission Possible post obstructive pneumonia  Acute  hypoxemic respiratory failure.   07/25 CT chest with no pulmonary embolism through the segmental pulmonary artery level. Small bilateral pleural effusions and associated atelectasis or consolidation.  Unchanged right lower lobe mass, containing a biopsy marker or fiducial.  Wbc is 9,5 and she has been afebrile.   Plan to continue bronchodilator therapy scheduled and as needed Continue with LABA and ICD Systemic corticosteroids with IV methylprednisolone  40 mg daily.  Airway clearing techniques with flutter valve and incentive spirometer  Antibiotic therapy with ceftriaxone  IV and azithromycin  po Follow up on cultures, cell count and temperature curve.  Patient will need follow up with pulmonary as outpatient.   Essential hypertension, benign Continue blood pressure control with hydralazine  and isosorbide .   CKD stage 3b, GFR 30-44 ml/min (HCC) Renal function with serum cr at 1,73 with K at 4,7 and serum bicarbonate at 26  Na 137   Follow up renal function and electrolytes in am.   Type 2 diabetes mellitus with hyperlipidemia (HCC) Uncontrolled hyperglycemia with fasting glucose 136 this am, likely steroid induced hyperglycemia.  Hold on basal insulin  for now.   Continue insulin  sliding scale for glucose cover and monitoring   Obesity, class 2 Calculated BMI is 32.8      Subjective: today patient with worsening dyspnea on exertion and wheezing, no chest pain   Physical Exam: Vitals:   09/05/23 0425 09/05/23 0740 09/05/23 0745 09/05/23 0926  BP: (!) 151/41   (!) 171/75  Pulse: 69   99  Resp: 18     Temp: 98.3 F (36.8  C)   97.9 F (36.6 C)  TempSrc: Oral   Oral  SpO2: 98% 100% 100% 100%  Weight:      Height:       Neurology awake and alert ENT with mild pallor Cardiovascular with S1 and S2 present and regular with no gallops, rubs or murmurs No JVD Respiratory with decreased breath sounds, prolonged expiratory phase with no wheezing, rhonchi or rales. Positive air  trapping  Abdomen protuberant but non tender and not distended, No lower extremity edema   Data Reviewed:    Family Communication: no family at the bedside   Disposition: Status is: Inpatient Remains inpatient appropriate because: recovering respiratory failure   Planned Discharge Destination: Home    Author: Elidia Toribio Furnace, MD 09/05/2023 11:12 AM  For on call review [email or web address].

## 2023-09-05 NOTE — Progress Notes (Signed)
 OT Cancellation Note  Patient Details Name: Stephanie [name] MRN: [id number] DOB: 1935-09-18   Cancelled Treatment:    Reason Eval/Treat Not Completed: OT screened, no needs identified, will sign off. Pt reported independent ambulation in the room with no difficulty taking care of her self. Physical Therapy note confirms pt is ambulating fairly well and near baseline. Pt will be removed from the OT list.   [name] OT, MOT   [name] 09/05/2023, 2:04 PM

## 2023-09-05 NOTE — Progress Notes (Signed)
 Nurse at bedside,patient alert and oriented to self,place,and situation,a little confused to time.Patient very pleasant.No c/o pain or discomfort noted.Plan of care on going.

## 2023-09-05 NOTE — Plan of Care (Signed)
   Problem: Education: Goal: Knowledge of General Education information will improve Description Including pain rating scale, medication(s)/side effects and non-pharmacologic comfort measures Outcome: Progressing

## 2023-09-06 DIAGNOSIS — J441 Chronic obstructive pulmonary disease with (acute) exacerbation: Secondary | ICD-10-CM | POA: Diagnosis not present

## 2023-09-06 LAB — BASIC METABOLIC PANEL WITH GFR
Anion gap: 8 (ref 5–15)
Anion gap: 12 (ref 5–15)
BUN: 42 mg/dL — ABNORMAL HIGH (ref 8–23)
BUN: 40 mg/dL — ABNORMAL HIGH (ref 8–23)
CO2: 25 mmol/L (ref 22–32)
CO2: 24 mmol/L (ref 22–32)
Calcium: 8.6 mg/dL — ABNORMAL LOW (ref 8.9–10.3)
Calcium: 8.8 mg/dL — ABNORMAL LOW (ref 8.9–10.3)
Chloride: 101 mmol/L (ref 98–111)
Chloride: 99 mmol/L (ref 98–111)
Creatinine, Ser: 1.53 mg/dL — ABNORMAL HIGH (ref 0.44–1.00)
Creatinine, Ser: 1.57 mg/dL — ABNORMAL HIGH (ref 0.44–1.00)
GFR, Estimated: 33 mL/min — ABNORMAL LOW (ref >60)
GFR, Estimated: 32 mL/min — ABNORMAL LOW (ref >60)
Glucose, Bld: 213 mg/dL — ABNORMAL HIGH (ref 70–99)
Glucose, Bld: 215 mg/dL — ABNORMAL HIGH (ref 70–99)
Potassium: 5.7 mmol/L — ABNORMAL HIGH (ref 3.5–5.1)
Potassium: 5.7 mmol/L — ABNORMAL HIGH (ref 3.5–5.1)
Sodium: 134 mmol/L — ABNORMAL LOW (ref 135–145)
Sodium: 135 mmol/L (ref 135–145)

## 2023-09-06 LAB — CBC
HCT: 35.6 % — ABNORMAL LOW (ref 36.0–46.0)
Hemoglobin: 10.9 g/dL — ABNORMAL LOW (ref 12.0–15.0)
MCH: 27.6 pg (ref 26.0–34.0)
MCHC: 30.6 g/dL (ref 30.0–36.0)
MCV: 90.1 fL (ref 80.0–100.0)
Platelets: 245 K/uL (ref 150–400)
RBC: 3.95 MIL/uL (ref 3.87–5.11)
RDW: 14.2 % (ref 11.5–15.5)
WBC: 12.8 K/uL — ABNORMAL HIGH (ref 4.0–10.5)
nRBC: 0 % (ref 0.0–0.2)

## 2023-09-06 LAB — GLUCOSE, CAPILLARY: Glucose-Capillary: 203 mg/dL — ABNORMAL HIGH (ref 70–99)

## 2023-09-06 MED ORDER — AZITHROMYCIN 250 MG PO TABS
500.0000 mg | ORAL_TABLET | Freq: Every day | ORAL | Status: DC
  Administered 2023-09-06 – 2023-09-08 (×3): 500 mg via ORAL
  Filled 2023-09-06 (×4): qty 2

## 2023-09-06 MED ORDER — SODIUM CHLORIDE 0.9 % IV SOLN
1.0000 g | INTRAVENOUS | Status: DC
  Administered 2023-09-06 – 2023-09-08 (×3): 1 g via INTRAVENOUS
  Filled 2023-09-06 (×3): qty 10

## 2023-09-06 MED ORDER — SODIUM ZIRCONIUM CYCLOSILICATE 10 G PO PACK
10.0000 g | PACK | Freq: Once | ORAL | Status: AC
  Administered 2023-09-06: 10 g via ORAL
  Filled 2023-09-06: qty 1

## 2023-09-06 MED ORDER — INSULIN ASPART 100 UNIT/ML IJ SOLN
0.0000 [IU] | Freq: Three times a day (TID) | INTRAMUSCULAR | Status: DC
  Administered 2023-09-07: 5 [IU] via SUBCUTANEOUS
  Administered 2023-09-07: 9 [IU] via SUBCUTANEOUS
  Administered 2023-09-07: 3 [IU] via SUBCUTANEOUS
  Administered 2023-09-08: 1 [IU] via SUBCUTANEOUS

## 2023-09-06 MED ORDER — CALCIUM GLUCONATE-NACL 1-0.675 GM/50ML-% IV SOLN
1.0000 g | Freq: Once | INTRAVENOUS | Status: AC
  Administered 2023-09-06: 1000 mg via INTRAVENOUS
  Filled 2023-09-06: qty 50

## 2023-09-06 MED ORDER — INSULIN ASPART 100 UNIT/ML IJ SOLN
0.0000 [IU] | Freq: Every day | INTRAMUSCULAR | Status: DC
  Administered 2023-09-06: 2 [IU] via SUBCUTANEOUS

## 2023-09-06 MED ORDER — AMLODIPINE BESYLATE 5 MG PO TABS
5.0000 mg | ORAL_TABLET | Freq: Every day | ORAL | Status: DC
  Administered 2023-09-06 – 2023-09-08 (×3): 5 mg via ORAL
  Filled 2023-09-06 (×3): qty 1

## 2023-09-06 NOTE — Progress Notes (Signed)
 [facility] PROGRESS NOTE  MAIE KESINGER (DOB: 1935-06-09) FMW:[id number] PCP: [facility]  Brief Narrative: Stephanie [name] is an 88 y.o. female with a history of asthma, COPD, CKD, paroxymal atrial fibrillation, aortic stenosis sp TAVR, chronic anemia and T2DM who presented with dyspnea. She was subsequently admitted for AECOPD.   08/14 outpatient pulmonary follow up, set up for right lower lobe biopsy.  Reported 3 to 4 days of worsening dyspnea on exertion, generalized weakness and cough. She has chronic orthopnea 2 to 3 pillows and denies PND or peripheral edema.  Her dyspnea rapidly worsened, to the point where she had wheezing and increased work of breathing. She called EMS, and was found in respiratory distress, using accessory respiratory muscles and having 02 saturation of 90% on room air. She received albuterol  neb and was transported to the ED.  On her initial physical examination her blood pressure was 145/57, HR 56, RR 20 and 02 saturation 92% on supplemental 02 per [place]  Her 02 desaturation on ambulation on room air, 84 to 88%.    Na 139, K 5.1 Cl 104 bicarbonate 26 glucose 69, bun 26 cr 1,59 BNP 581  Wbc 9,3 hgb 10.9 plt 233    Chest radiograph with left rotation, hypoinflation, positive cardiomegaly, positive right lower lobe infiltrate with small pleural effusion.   EKG 53 bpm, normal axis, normal intervals, qtc 433, sinus rhythm with no significant ST segment or  T wave changes.    Patient was placed on antibiotic therapy, supplemental 02 per [place] and bronchodilator therapy.    08/17 clinically improving, 02 saturation 96% on 2 L/min per [place].  08/18 not yet back to baseline.  08/19 still short of breath, starting antibiotics.   Subjective: Breathing still short, but overall feels better today. Dry cough is persistent.   Objective: BP (!) 172/74 (BP Location: Left Arm)   Pulse (!) 51   Temp 99 F (37.2 C) (Oral)   Resp 18   Ht  5' 5 (1.651 m)   Wt 89.4 kg   SpO2 99%   BMI 32.80 kg/m   Gen: Obese female in no distress Pulm: Diminished with wheezing, no crackles, nonlabored with supplementation  CV: Regular bradycardia without MRG  GI: Soft, NT, ND, +BS  Neuro: Alert and oriented. No new focal deficits. Ext: Warm, no deformities. Skin: No rashes, lesions or ulcers on visualized skin   Assessment & Plan:  COPD with acute exacerbation (HCC) Right lower lobe pneumonia, community acquired and present on admission Possible post obstructive pneumonia  Acute hypoxemic respiratory failure.    07/25 CT chest with no pulmonary embolism through the segmental pulmonary artery level. Small bilateral pleural effusions and associated atelectasis or consolidation.  Unchanged right lower lobe mass, containing a biopsy marker or fiducial.   Wbc is 9,5 and she has been afebrile.    - Plan to continue bronchodilator therapy scheduled and as needed Continue with LABA and ICD - Continue methylprednisolone  40 mg IV daily.  - Start ceftriaxone  IV and azithromycin  po - Follow up on cultures, cell count and temperature curve.  - Patient will need follow up with pulmonary as outpatient.    Essential hypertension, benign Continue blood pressure control with hydralazine  and isosorbide .    CKD stage 3b  Renal function has returned near baseline. Avoid nephrotoxins.  - Bladder scan r/o retention.   Hyperkalemia:  - Lokelma , recheck to r/o hemolysis. Continue cardiac monitoring, give calcium  gluconate - Follow up renal  function and electrolytes in am.    Type 2 diabetes mellitus with hyperlipidemia (HCC) Uncontrolled hyperglycemia with fasting glucose 136 this am, likely steroid induced hyperglycemia.  - Hold on basal insulin  for now.  - Start SSI   Obesity, class 1: Calculated BMI is 32.8   Sinus bradycardia:  - Hold BB for now, replace with norvasc   Bernardino KATHEE Come, MD [facility] [email or web address] 09/06/2023, 5:07  PM

## 2023-09-07 DIAGNOSIS — J441 Chronic obstructive pulmonary disease with (acute) exacerbation: Secondary | ICD-10-CM | POA: Diagnosis not present

## 2023-09-07 LAB — GLUCOSE, CAPILLARY
Glucose-Capillary: 214 mg/dL — ABNORMAL HIGH (ref 70–99)
Glucose-Capillary: 276 mg/dL — ABNORMAL HIGH (ref 70–99)
Glucose-Capillary: 385 mg/dL — ABNORMAL HIGH (ref 70–99)
Glucose-Capillary: 160 mg/dL — ABNORMAL HIGH (ref 70–99)

## 2023-09-07 LAB — RENAL FUNCTION PANEL
Albumin: 3.1 g/dL — ABNORMAL LOW (ref 3.5–5.0)
Anion gap: 9 (ref 5–15)
BUN: 40 mg/dL — ABNORMAL HIGH (ref 8–23)
CO2: 25 mmol/L (ref 22–32)
Calcium: 8.7 mg/dL — ABNORMAL LOW (ref 8.9–10.3)
Chloride: 102 mmol/L (ref 98–111)
Creatinine, Ser: 1.59 mg/dL — ABNORMAL HIGH (ref 0.44–1.00)
GFR, Estimated: 32 mL/min — ABNORMAL LOW (ref >60)
Glucose, Bld: 218 mg/dL — ABNORMAL HIGH (ref 70–99)
Phosphorus: 3.3 mg/dL (ref 2.5–4.6)
Potassium: 5.4 mmol/L — ABNORMAL HIGH (ref 3.5–5.1)
Sodium: 136 mmol/L (ref 135–145)

## 2023-09-07 LAB — POTASSIUM: Potassium: 5.1 mmol/L (ref 3.5–5.1)

## 2023-09-07 MED ORDER — PREDNISONE 20 MG PO TABS
40.0000 mg | ORAL_TABLET | Freq: Every day | ORAL | Status: DC
  Administered 2023-09-08: 40 mg via ORAL
  Filled 2023-09-07: qty 2

## 2023-09-07 MED ORDER — SODIUM ZIRCONIUM CYCLOSILICATE 10 G PO PACK
10.0000 g | PACK | Freq: Every day | ORAL | Status: DC
  Administered 2023-09-07 – 2023-09-08 (×2): 10 g via ORAL
  Filled 2023-09-07 (×2): qty 1

## 2023-09-07 NOTE — Progress Notes (Signed)
 [facility] PROGRESS NOTE  BRALEY LUCKENBAUGH (DOB: February 24, 1935) FMW:[id number] PCP: [facility]  Brief Narrative: Stephanie [name] is an 88 y.o. female with a history of asthma, COPD, CKD, paroxymal atrial fibrillation, aortic stenosis sp TAVR, chronic anemia and T2DM who presented with dyspnea. She was subsequently admitted for AECOPD.   08/14 outpatient pulmonary follow up, set up for right lower lobe biopsy.  Reported 3 to 4 days of worsening dyspnea on exertion, generalized weakness and cough. She has chronic orthopnea 2 to 3 pillows and denies PND or peripheral edema.  Her dyspnea rapidly worsened, to the point where she had wheezing and increased work of breathing. She called EMS, and was found in respiratory distress, using accessory respiratory muscles and having 02 saturation of 90% on room air. She received albuterol  neb and was transported to the ED.  On her initial physical examination her blood pressure was 145/57, HR 56, RR 20 and 02 saturation 92% on supplemental 02 per [place]  Her 02 desaturation on ambulation on room air, 84 to 88%.    Na 139, K 5.1 Cl 104 bicarbonate 26 glucose 69, bun 26 cr 1,59 BNP 581  Wbc 9,3 hgb 10.9 plt 233    Chest radiograph with left rotation, hypoinflation, positive cardiomegaly, positive right lower lobe infiltrate with small pleural effusion.   EKG 53 bpm, normal axis, normal intervals, qtc 433, sinus rhythm with no significant ST segment or  T wave changes.    Patient was placed on antibiotic therapy, supplemental 02 per [place] and bronchodilator therapy.    08/17 clinically improving, 02 saturation 96% on 2 L/min per [place].  08/18 not yet back to baseline.  08/19 still short of breath, starting antibiotics.   Subjective: Cough better, nonproductive, feels short of breath at times but overall improved. No wheezing per pt.   Objective: BP (!) 131/56 (BP Location: Right Arm)   Pulse 66   Temp 98 F (36.7 C) (Oral)    Resp 20   Ht 5' 5 (1.651 m)   Wt 89.4 kg   SpO2 99%   BMI 32.80 kg/m   Gen: No distress, obese female, elderly Pulm: No wheezes or crackles today, nonlabored with supplemental oxygen .  CV: RRR, rate in 60's, no MRG GI: Soft, NT, ND, +BS Neuro: Alert and oriented. No new focal deficits. Ext: Warm, no deformities Skin: No rashes, lesions or ulcers on visualized skin   Assessment & Plan:  COPD with acute exacerbation (HCC) Right lower lobe pneumonia, community acquired and present on admission Possible post obstructive pneumonia  Acute hypoxemic respiratory failure.    07/25 CT chest with no pulmonary embolism through the segmental pulmonary artery level. Small bilateral pleural effusions and associated atelectasis or consolidation.  Unchanged right lower lobe mass, containing a biopsy marker or fiducial. - Continue scheduled and prn SABA as well as LABA and ICS - Continue steroids, but with improvement in wheezing, will taper to prednisone  starting 8/21.   - Continue ceftriaxone  IV and azithromycin  po - Follow up on cultures, cell count and temperature curve.  - Patient will need follow up with pulmonary as outpatient. Has follow up with Dr. Shelah 9/3 for pulmonary nodule. - Repeat ambulatory pulse oximetry.    Essential hypertension, benign Continue blood pressure control with hydralazine  and isosorbide .    CKD stage 3b  Renal function has returned near baseline. Avoid nephrotoxins.  - Bladder scanned > no retention.  Hyperkalemia: Possible related to diet with stable  renal function.  - Repeat lokelma  today, recheck in PM. Overall improving but still elevated, no telemetry changes noted. Institute renal diet. Given calcium  gluconate.     Type 2 diabetes mellitus with hyperlipidemia: steroid induced hyperglycemia.  - Hold on basal insulin  since we're tapering steroids.  - Continue SSI   Obesity, class 1: Calculated BMI is 32.8   Sinus bradycardia:  - Hold BB for now,  replace with norvasc   Bernardino KATHEE Come, MD [facility] [email or web address] 09/07/2023, 1:21 PM

## 2023-09-07 NOTE — Progress Notes (Signed)
 Physical Therapy Treatment Patient Details Name: Stephanie [name] MRN: [id number] DOB: [birth year] Today's Date: 09/07/2023   History of Present Illness Stephanie [name] is a 88 y.o. female with medical history significant of asthma, COPD, CKD, paroxymal atrial fibrillation, aortic stenosis sp TAVR, chronic anemia and T2DM who presented with dyspnea.   08/14 outpatient pulmonary follow up.  Reported 3 to 4 days of worsening dyspnea on exertion, generalized weakness and cough. She has chronic orthopnea 2 to 3 pillows and denies PND or peripheral edema.   Her dyspnea rapidly worsened, to the point where today had wheezing and increased work of breathing. She called EMS, and was found in respiratory distress, using accessory respiratory muscles and having 02 saturation of 90% on room air.  She received albuterol  neb and was transported to the ED.      At the time of my examination her dyspnea is improving but not yet back to baseline. She continue to have 02 desaturation on ambulation on room air, 84 to 88%.    PT Comments  Pt sitting on EOB and willing to participate.  Pt at 95% room air and able to keep O2 saturation from 89-95%.  Increased distance with RW, SBA with no LOB.  Added standing balance activities with min A for safety that was tolerated well.  EOS pt left in chair with call bell within reach, no reports of pain, was limited by appropriate levels of fatigue.      If plan is discharge home, recommend the following:     Can travel by private vehicle        Equipment Recommendations       Recommendations for Other Services       Precautions / Restrictions Precautions Precautions: Fall Recall of Precautions/Restrictions: Intact     Mobility  Bed Mobility Overal bed mobility: Independent                  Transfers   Equipment used: Rolling walker (2 wheels) Transfers: Sit to/from Stand Sit to Stand: Supervision           General transfer comment: stable upon  standing with RW present    Ambulation/Gait Ambulation/Gait assistance: Supervision Gait Distance (Feet): 120 Feet Assistive device: Rolling walker (2 wheels) Gait Pattern/deviations: Decreased step length - left, Decreased stance time - right, Decreased stride length, Trunk flexed Gait velocity: slightly decreased     General Gait Details: slightly labored movement with good return for ambulating in room, hallway using RW without loss of balance   Stairs             Wheelchair Mobility     Tilt Bed    Modified Rankin (Stroke Patients Only)       Balance                                            Communication    Cognition Arousal: Alert Behavior During Therapy: WFL for tasks assessed/performed   PT - Cognitive impairments: No apparent impairments                                Cueing    Exercises      General Comments        Pertinent Vitals/Pain Pain Assessment Pain Assessment: No/denies pain    Home  Living                          Prior Function            PT Goals (current goals can now be found in the care plan section)      Frequency           PT Plan      Co-evaluation              AM-PAC PT 6 Clicks Mobility   Outcome Measure  Help needed turning from your back to your side while in a flat bed without using bedrails?: None Help needed moving from lying on your back to sitting on the side of a flat bed without using bedrails?: None Help needed moving to and from a bed to a chair (including a wheelchair)?: None Help needed standing up from a chair using your arms (e.g., wheelchair or bedside chair)?: None Help needed to walk in hospital room?: A Little Help needed climbing 3-5 steps with a railing? : A Little 6 Click Score: 22    End of Session Equipment Utilized During Treatment: Gait belt Activity Tolerance: Patient tolerated treatment well;Patient limited by  fatigue Patient left: in chair;with call bell/phone within reach;with chair alarm set Nurse Communication: Mobility status PT Visit Diagnosis: Unsteadiness on feet (R26.81);Other abnormalities of gait and mobility (R26.89);Muscle weakness (generalized) (M62.81)     Time: 8562-8544 PT Time Calculation (min) (ACUTE ONLY): 18 min  Charges:      PT General Charges $$ ACUTE PT VISIT: 1 Visit                     Stephanie [name], LPTA/CLT; CBIS [phone]  Stephanie [name] 09/07/2023, 4:37 PM

## 2023-09-07 NOTE — Progress Notes (Signed)
 Patient's Potassium 5.4 this am,Dr Bernardino Come notified. Plan of care on going.

## 2023-09-07 NOTE — Progress Notes (Signed)
 Mobility Specialist Progress Note:    09/07/23 1330  Mobility  Activity Ambulated with assistance  Level of Assistance Contact guard assist, steadying assist  Assistive Device Front wheel walker  Distance Ambulated (ft) 75 ft  Range of Motion/Exercises Active;All extremities  Activity Response Tolerated well  Mobility Referral Yes  Mobility visit 1 Mobility  Mobility Specialist Start Time (ACUTE ONLY) 1334  Mobility Specialist Stop Time (ACUTE ONLY) 1352  Mobility Specialist Time Calculation (min) (ACUTE ONLY) 18 min   Pt received in bed, agreeable to mobility. Required CGA to stand and ambulate with RW. Tolerated well, SpO2 91-94% on RA throughout session. Audible SOB near EOS, resolved quickly. Left pt sitting EOB, all needs met.   Sherrilee Ditty Mobility Specialist Please contact via Special educational needs teacher or  Rehab office at [phone]

## 2023-09-07 NOTE — Progress Notes (Signed)
 Patient alert ,and oriented times four this morning.Blood pressure was 170/66,heart rate 65,Dr Bernardino Come notified.No c/o pain or discomfort noted this am.Plan of care on going.

## 2023-09-07 NOTE — Assessment & Plan Note (Addendum)
 Quit smoking 2000 - PFT's  02/05/22  FEV1 0.91 (47 % ) ratio 0.70  (with shortened Texp)  p 3 % improvement from saba p 0 prior to study with DLCO  22 (115%)   and FV curve slt concave  with fev1/svc = 56% - 04/07/2022  After extensive coaching inhaler device,  effectiveness =    0% so try adding bud 0.25 mg to albuterol  bid and prn  - 09/01/2023 newly 02 dep with walking 09/01/2023 > see chronic resp failure   Not clear why so much worse sob but really hasn't tried her saba neb much yet ? Whether having reaction to ACEi or more CHF than apparent but needs to complete the w/u with CXR and try off ACEi (see hbp) with f/u cards recs   The pleural effusion are small but Note that pleural effusion and copd have the same effect on insp muscles/mechanics (both shorten their length prior to inspiration making them weaker with less force reserve) so they are synergistic in causing sob.

## 2023-09-07 NOTE — Plan of Care (Signed)
   Problem: Education: Goal: Knowledge of General Education information will improve Description Including pain rating scale, medication(s)/side effects and non-pharmacologic comfort measures Outcome: Progressing   Problem: Education: Goal: Knowledge of General Education information will improve Description Including pain rating scale, medication(s)/side effects and non-pharmacologic comfort measures Outcome: Progressing

## 2023-09-07 NOTE — Inpatient Diabetes Management (Signed)
 Inpatient Diabetes Program Recommendations  AACE/ADA: New Consensus Statement on Inpatient Glycemic Control   Target Ranges:  Prepandial:   less than 140 mg/dL      Peak postprandial:   less than 180 mg/dL (1-2 hours)      Critically ill patients:  140 - 180 mg/dL    Latest Reference Range & Units 09/06/23 20:43 09/07/23 07:13  Glucose-Capillary 70 - 99 mg/dL 796 (H) 785 (H)    Latest Reference Range & Units 09/03/23 10:46 09/04/23 03:20 09/04/23 15:35 09/05/23 03:13 09/06/23 04:58 09/06/23 07:30 09/07/23 04:40  Glucose 70 - 99 mg/dL 69 (L) 787 (H) 879 (H) 136 (H) 213 (H) 215 (H) 218 (H)   Review of Glycemic Control  Diabetes history: DM2 Outpatient Diabetes medications: Lantus 40 units QHS Current orders for Inpatient glycemic control: Novolog  0-9 units TID with meals, Novolog  0-5 units at bedtime; Solumedrol 40 mg Q24H  Inpatient Diabetes Program Recommendations:    Insulin : If steroids are continued as ordered, please consider ordering Semglee 5 units Q24H.  Thanks, Earnie Gainer, RN, MSN, CDCES Diabetes Coordinator Inpatient Diabetes Program [phone] (Team Pager from 8am to 5pm)

## 2023-09-08 DIAGNOSIS — J441 Chronic obstructive pulmonary disease with (acute) exacerbation: Secondary | ICD-10-CM | POA: Diagnosis not present

## 2023-09-08 LAB — GLUCOSE, CAPILLARY: Glucose-Capillary: 139 mg/dL — ABNORMAL HIGH (ref 70–99)

## 2023-09-08 MED ORDER — AZITHROMYCIN 250 MG PO TABS: 250.0000 mg | ORAL_TABLET | Freq: Every day | ORAL | 0 refills | Status: AC

## 2023-09-08 MED ORDER — CEFDINIR 300 MG PO CAPS
300.0000 mg | ORAL_CAPSULE | Freq: Every day | ORAL | 0 refills | Status: AC
Start: 2023-09-08 — End: 2023-09-13

## 2023-09-08 MED ORDER — PREDNISONE 20 MG PO TABS: 40.0000 mg | ORAL_TABLET | Freq: Every day | ORAL | 0 refills | Status: AC

## 2023-09-08 NOTE — Discharge Summary (Signed)
 Physician Discharge Summary   Patient: Stephanie [name] MRN: [id number] DOB: 1935/11/03  Admit date:     09/03/2023  Discharge date: 09/08/23  Discharge Physician: Bernardino KATHEE Come   PCP: [facility]   Recommendations at discharge:   Follow up with pulmonary as scheduled 9/3.  Continue routine follow up with PCP for chronic medical conditions.    Discharge Diagnoses: Principal Problem:   COPD with acute exacerbation (HCC) Active Problems:   Essential hypertension, benign   CKD stage 3b, GFR 30-44 ml/min (HCC)   Type 2 diabetes mellitus with hyperlipidemia (HCC)   Obesity, class 2  Hospital Course: Stephanie [name] is an 88 y.o. female with a history of asthma, COPD, CKD, paroxymal atrial fibrillation, aortic stenosis sp TAVR, chronic anemia and T2DM who presented with dyspnea. She was subsequently admitted for AECOPD.    08/14 outpatient pulmonary follow up, set up for right lower lobe biopsy.  Reported 3 to 4 days of worsening dyspnea on exertion, generalized weakness and cough. She has chronic orthopnea 2 to 3 pillows and denies PND or peripheral edema.  Her dyspnea rapidly worsened, to the point where she had wheezing and increased work of breathing. She called EMS, and was found in respiratory distress, using accessory respiratory muscles and having 02 saturation of 90% on room air. She received albuterol  neb and was transported to the ED.  On her initial physical examination her blood pressure was 145/57, HR 56, RR 20 and 02 saturation 92% on supplemental 02 per [place]  Her 02 desaturation on ambulation on room air, 84 to 88%.    Na 139, K 5.1 Cl 104 bicarbonate 26 glucose 69, bun 26 cr 1,59 BNP 581  Wbc 9,3 hgb 10.9 plt 233    Chest radiograph with left rotation, hypoinflation, positive cardiomegaly, positive right lower lobe infiltrate with small pleural effusion.   EKG 53 bpm, normal axis, normal intervals, qtc 433, sinus rhythm with no significant ST  segment or  T wave changes.    Patient was placed on antibiotic therapy, supplemental 02 per [place] and bronchodilator therapy.    With antibiotic treatment and steroids and nebulized therapies, her breathing returned to baseline, hypoxia resolved.     Assessment and Plan:  COPD with acute exacerbation (HCC) Right lower lobe pneumonia, community acquired and present on admission:  Acute hypoxemic respiratory failure.     07/25 CT chest with no pulmonary embolism through the segmental pulmonary artery level. Small bilateral pleural effusions and associated atelectasis or consolidation.  Unchanged right lower lobe mass, containing a biopsy marker or fiducial. - Continue BDs.  - Complete steroid burst with prednisone  x3 days.  - Complete antibiotics with azithromycin  and renally dosed cefdinir .  - Continue plan to follow up with pulmonology in 2 weeks for enlarged pulmonary nodule. Pt with hx SBRT    Essential hypertension, benign Continue home Tx    CKD stage 3b  Renal function has returned near baseline. Avoid nephrotoxins.  - Bladder scanned > no retention.   Hyperkalemia: Possible related to diet with stable renal function. Resolved on renal diet.     Type 2 diabetes mellitus with hyperlipidemia: steroid induced hyperglycemia.  - Continue home Tx and PCP follow up   Obesity, class 1: Calculated BMI is 32.8    Sinus bradycardia: Improved.  Consultants: None Procedures performed: None  Disposition: Home Diet recommendation:  Cardiac and Carb modified diet DISCHARGE MEDICATION: Allergies as of 09/08/2023  Reactions   Amoxicillin Swelling   Tongue swelling   Penicillins Swelling   Sulfa Antibiotics Swelling   Lip swelling   Nitrofurantoin Nausea And Vomiting        Medication List     TAKE these medications    albuterol  (2.5 MG/3ML) 0.083% nebulizer solution Commonly known as: PROVENTIL  Take 3 mLs (2.5 mg total) by nebulization every 2 (two) hours as needed  for wheezing or shortness of breath.   azithromycin  250 MG tablet Commonly known as: ZITHROMAX  Take 1 tablet (250 mg total) by mouth daily for 3 days.   carvedilol  6.25 MG tablet Commonly known as: COREG  Take 6.25 mg by mouth 2 (two) times daily with a meal.   cefdinir  300 MG capsule Commonly known as: OMNICEF  Take 1 capsule (300 mg total) by mouth daily for 5 days.   cetirizine 10 MG tablet Commonly known as: ZYRTEC Take 10 mg by mouth daily.   cinacalcet 30 MG tablet Commonly known as: SENSIPAR Take 30 mg by mouth daily.   FeroSul 325 (65 Fe) MG tablet Generic drug: ferrous sulfate  Take 325 mg by mouth daily.   furosemide  20 MG tablet Commonly known as: LASIX  Take 1 tablet (20 mg total) by mouth daily for 3 days, THEN 1 tablet (20 mg total) daily as needed (leg edema, shortness of breath, or weight gain.). Start taking on: August 30, 2023 What changed: See the new instructions.   gabapentin  100 MG capsule Commonly known as: NEURONTIN  Take 100-200 mg by mouth at bedtime.   GNP Vitamin D3 Extra Strength 25 MCG (1000 UT) tablet Generic drug: Cholecalciferol Take 1,000 Units by mouth daily.   hydrALAZINE  50 MG tablet Commonly known as: APRESOLINE  Take 1 tablet (50 mg total) by mouth 3 (three) times daily.   isosorbide  mononitrate 30 MG 24 hr tablet Commonly known as: IMDUR  Take 1 tablet (30 mg total) by mouth daily.   Lantus SoloStar 100 UNIT/ML Solostar Pen Generic drug: insulin  glargine Inject 40 Units into the skin at bedtime.   predniSONE  20 MG tablet Commonly known as: DELTASONE  Take 2 tablets (40 mg total) by mouth daily with breakfast for 3 days.   valsartan  80 MG tablet Commonly known as: Diovan  Take 1 tablet (80 mg total) by mouth daily.        Follow-up Information     [facility] Follow up.   Contact information: [address] [place] [address] [phone]         Shelah Lamar RAMAN, MD Follow up on  09/21/2023.   Specialty: Pulmonary Disease Contact information: [address] [place] [address] [phone]                Discharge Exam: Stephanie [name]   09/03/23 1027 09/03/23 1554  Weight: 89.4 kg 89.4 kg  BP (!) 185/78 (BP Location: Left Arm)   Pulse 70   Temp 99 F (37.2 C) (Oral)   Resp 20   Ht 5' 5 (1.651 m)   Wt 89.4 kg   SpO2 94%   BMI 32.80 kg/m   pleasant female in no distress ambulating freely Clear, nonlabored  Condition at discharge: stable  The results of significant diagnostics from this hospitalization (including imaging, microbiology, ancillary and laboratory) are listed below for reference.   Imaging Studies: DG Chest 2 View Result Date: 09/03/2023 EXAM: 2 VIEW(S) XRAY OF THE CHEST 09/03/2023 10:58:00 AM COMPARISON: 08/12/2023 CLINICAL HISTORY: SOB. Per chart: Pt via EMS from  home c/o SOB with wheezing and accessory muscle use. Hx COPD. Albuterol  given en route. O2 90% RA on scene, increased to 96% after albuterol . Lungs clear bilaterally. FINDINGS: LUNGS AND PLEURA: Interval increase opacification within the right lung base, which may represent atelectasis or airspace disease. Small bilateral pleural effusions identified. HEART AND MEDIASTINUM: Cardiac enlargement. Status post TAVR. Aortic atherosclerotic calcification. BONES AND SOFT TISSUES: No acute osseous abnormality. IMPRESSION: 1. Interval increase opacification within the right lung base, possibly representing atelectasis or airspace disease. 2. Small bilateral pleural effusions. 3. Cardiac enlargement, status post TAVR, and aortic atherosclerotic calcification. Electronically signed by: Waddell Calk MD 09/03/2023 11:19 AM EDT RP Workstation: [id number]   CT Angio Chest PE W and/or Wo Contrast Result Date: 08/12/2023 CLINICAL DATA:  PE suspected, shortness of breath * Tracking Code: BO * EXAM: CT ANGIOGRAPHY CHEST WITH CONTRAST TECHNIQUE: Multidetector CT imaging of the chest was  performed using the standard protocol during bolus administration of intravenous contrast. Multiplanar CT image reconstructions and MIPs were obtained to evaluate the vascular anatomy. RADIATION DOSE REDUCTION: This exam was performed according to the departmental dose-optimization program which includes automated exposure control, adjustment of the mA and/or kV according to patient size and/or use of iterative reconstruction technique. CONTRAST:  OMNIPAQUE  IOHEXOL  350 MG/ML SOLN COMPARISON:  07/28/2023 FINDINGS: Cardiovascular: Examination for pulmonary embolism is limited by breath motion artifact in the lung bases. Within this limitation, no evidence of pulmonary embolism through the segmental pulmonary arterial level. Aortic atherosclerosis. Aortic valve stent endograft. Cardiomegaly. Three-vessel coronary artery calcifications. No pericardial effusion. Mediastinum/Nodes: No enlarged mediastinal, hilar, or axillary lymph nodes. Small hiatal hernia. Tracheobronchomalacia (series 6, image 54). Thyroid  gland and esophagus demonstrate no significant findings. Lungs/Pleura: Small bilateral pleural effusions and associated atelectasis or consolidation, new compared to prior examination. Unchanged right lower lobe mass containing a biopsy marker or fiducial (series 6, image 87). Upper Abdomen: No acute abnormality. Benign macroscopic fat containing left adrenal adenoma. Musculoskeletal: No chest wall abnormality. No acute osseous findings. Nonacute fractures of the anterolateral right ribs (series 5, image 174). Review of the MIP images confirms the above findings. IMPRESSION: 1. Examination for pulmonary embolism is limited by breath motion artifact in the lung bases. Within this limitation, no evidence of pulmonary embolism through the segmental pulmonary arterial level. 2. Small bilateral pleural effusions and associated atelectasis or consolidation, new compared to prior examination. 3. Unchanged right lower  lobe mass containing a biopsy marker or fiducial. 4. Cardiomegaly and coronary artery disease. 5. Tracheobronchomalacia. Aortic Atherosclerosis (ICD10-I70.0). Electronically Signed   By: Marolyn JONETTA Jaksch M.D.   On: 08/12/2023 11:38   DG Chest Port 1 View Result Date: 08/12/2023 CLINICAL DATA:  Shortness of breath. EXAM: PORTABLE CHEST 1 VIEW COMPARISON:  07/28/2023. FINDINGS: Stable cardiomegaly. Prosthetic aortic valve. Aortic atherosclerosis. Prior biopsy fiducial markers again noted in the right lower lung zone with similar surrounding streaky opacity, corresponding the site of known mass, better evaluated on the prior CT. Mild left basilar atelectasis. No new focal consolidation. No sizable pleural effusion. No pneumothorax. Degenerative changes of the bilateral glenohumeral joints. No acute osseous abnormality. IMPRESSION: 1. No significant change compared to the prior exam. Prior biopsy fiducial markers again noted in the right lower lung zone with similar surrounding streaky opacity, corresponding the site of known mass better evaluated on the prior CT. 2. Mild basilar atelectasis. Electronically Signed   By: Harrietta Sherry M.D.   On: 08/12/2023 09:23    Microbiology: Results for orders placed or  performed during the hospital encounter of 08/12/23  Resp panel by RT-PCR (RSV, Flu A&B, Covid) Anterior Nasal Swab     Status: None   Collection Time: 08/12/23 10:30 AM   Specimen: Anterior Nasal Swab  Result Value Ref Range Status   SARS Coronavirus 2 by RT PCR NEGATIVE NEGATIVE Final    Comment: (NOTE) SARS-CoV-2 target nucleic acids are NOT DETECTED.  The SARS-CoV-2 RNA is generally detectable in upper respiratory specimens during the acute phase of infection. The lowest concentration of SARS-CoV-2 viral copies this assay can detect is 138 copies/mL. A negative result does not preclude SARS-Cov-2 infection and should not be used as the sole basis for treatment or other patient management  decisions. A negative result may occur with  improper specimen collection/handling, submission of specimen other than nasopharyngeal swab, presence of viral mutation(s) within the areas targeted by this assay, and inadequate number of viral copies(<138 copies/mL). A negative result must be combined with clinical observations, patient history, and epidemiological information. The expected result is Negative.  Fact Sheet for Patients:  BloggerCourse.com  Fact Sheet for Healthcare Providers:  SeriousBroker.it  This test is no t yet approved or cleared by the [place]  FDA and  has been authorized for detection and/or diagnosis of SARS-CoV-2 by FDA under an Emergency Use Authorization (EUA). This EUA will remain  in effect (meaning this test can be used) for the duration of the COVID-19 declaration under Section 564(b)(1) of the Act, 21 U.S.C.section 360bbb-3(b)(1), unless the authorization is terminated  or revoked sooner.       Influenza A by PCR NEGATIVE NEGATIVE Final   Influenza B by PCR NEGATIVE NEGATIVE Final    Comment: (NOTE) The Xpert Xpress SARS-CoV-2/FLU/RSV plus assay is intended as an aid in the diagnosis of influenza from Nasopharyngeal swab specimens and should not be used as a sole basis for treatment. Nasal washings and aspirates are unacceptable for Xpert Xpress SARS-CoV-2/FLU/RSV testing.  Fact Sheet for Patients: BloggerCourse.com  Fact Sheet for Healthcare Providers: SeriousBroker.it  This test is not yet approved or cleared by the [place]  FDA and has been authorized for detection and/or diagnosis of SARS-CoV-2 by FDA under an Emergency Use Authorization (EUA). This EUA will remain in effect (meaning this test can be used) for the duration of the COVID-19 declaration under Section 564(b)(1) of the Act, 21 U.S.C. section 360bbb-3(b)(1), unless the  authorization is terminated or revoked.     Resp Syncytial Virus by PCR NEGATIVE NEGATIVE Final    Comment: (NOTE) Fact Sheet for Patients: BloggerCourse.com  Fact Sheet for Healthcare Providers: SeriousBroker.it  This test is not yet approved or cleared by the [place]  FDA and has been authorized for detection and/or diagnosis of SARS-CoV-2 by FDA under an Emergency Use Authorization (EUA). This EUA will remain in effect (meaning this test can be used) for the duration of the COVID-19 declaration under Section 564(b)(1) of the Act, 21 U.S.C. section 360bbb-3(b)(1), unless the authorization is terminated or revoked.  Performed at [facility], [address] [place], [place] [address]     Labs: CBC: Recent Labs  Lab 09/03/23 1046 09/04/23 0320 09/05/23 0313 09/06/23 0458  WBC 9.3 9.5 11.8* 12.8*  HGB 10.9* 10.4* 10.6* 10.9*  HCT 34.9* 34.1* 34.6* 35.6*  MCV 91.4 92.7 91.5 90.1  PLT 233 250 257 245   Basic Metabolic Panel: Recent Labs  Lab 09/04/23 1535 09/05/23 0313 09/06/23 0458 09/06/23 0730 09/07/23 0440 09/07/23 1410  NA 136 137 134* 135 136  --  K 4.8 4.7 5.7* 5.7* 5.4* 5.1  CL 100 103 101 99 102  --   CO2 26 26 25 24 25   --   GLUCOSE 120* 136* 213* 215* 218*  --   BUN 41* 43* 42* 40* 40*  --   CREATININE 2.00* 1.73* 1.53* 1.57* 1.59*  --   CALCIUM  8.3* 8.3* 8.6* 8.8* 8.7*  --   PHOS  --   --   --   --  3.3  --    Liver Function Tests: Recent Labs  Lab 09/07/23 0440  ALBUMIN 3.1*   CBG: Recent Labs  Lab 09/07/23 0713 09/07/23 1116 09/07/23 1607 09/07/23 2214 09/08/23 0719  GLUCAP 214* 276* 385* 160* 139*    Discharge time spent: greater than 30 minutes.  Signed: Bernardino KATHEE Come, MD [facility] 09/08/2023

## 2023-09-10 DIAGNOSIS — J9611 Chronic respiratory failure with hypoxia: Secondary | ICD-10-CM | POA: Insufficient documentation

## 2023-09-10 NOTE — Assessment & Plan Note (Addendum)
 D/C ACEi 09/01/2023 due to hoarseness / dry cough   In the best review of chronic cough to date ( NEJM 2016 [id number]) ,  ACEi are now felt to cause cough in up to  20% of pts which is a 4 fold increase from previous reports and does not include the variety of non-specific complaints we see in pulmonary clinic in pts on ACEi but previously attributed to another dx like  Copd/asthma and  include PNDS, throat and chest congestion, bronchitis, unexplained dyspnea and noct strangling sensations, and hoarseness, but also  atypical /refractory GERD symptoms like dysphagia and bad heartburn   The only way I know  to prove this is not an ACEi Case is a trial off ACEi x a minimum of 6 weeks then regroup.   >>> try valsartan  40 mg one daily and f/u  as planned with DR Shelah 09/21/23

## 2023-09-10 NOTE — Assessment & Plan Note (Addendum)
 09/01/2023   Walked on RA  x 3  lap(s) =  approx 450  ft  @ slow pace, stopped due to desats to  with lowest 02 sats 87%  > corrected to 89%  on 2lpm   Rx 2lpm 24/7   Each maintenance medication was reviewed in detail including emphasizing most importantly the difference between maintenance and prns and under what circumstances the prns are to be triggered using an action plan format where appropriate.  Total time for H and P, chart review, counseling, reviewing hfa/neb/02  device(s) , directly observing portions of ambulatory 02 saturation study/ and generating customized AVS unique to this office visit / same day charting = 40 min   for multiple  refractory respiratory  symptoms of uncertain etiology

## 2023-09-10 NOTE — Assessment & Plan Note (Addendum)
 Quit smoking around 2000 - PET  04/22/20 Low metabolic activity associated with the RIGHT upper lobe nodule is not typical of bronchogenic carcinoma; however, low-grade adenocarcinoma can exhibit low metabolic activity. As lesion is increased in size/density from 2015 recommend tissue samplin - CTa 03/05/21 1.6 x 0.8 cm mixed semi-solid nodule with irregular and ground-glass margins located in the anterior aspect of the right lower lobe no change since 11/19/20 so rec f/u in one year >  Placed in reminder file  - CT  10/30/21  1.7 x 1.0 cm irregular ill-defined nodule in the anterior right lower lobe, contiguous with the fissure has increased minimally in size since the prior study but qualitatively appears more confluent today. Imaging features are highly suspicious for neoplasm - FOB / Icard 03/02/22 atypical cells > SRT completed   >>>  f/u with DR Byrum planned for 09/21/23 ? Need for refob based on CT chest 08/12/23 ? Recurrence or assoc effusion

## 2023-09-21 ENCOUNTER — Encounter: Payer: Self-pay | Admitting: Emergency Medicine

## 2023-09-21 ENCOUNTER — Ambulatory Visit (INDEPENDENT_AMBULATORY_CARE_PROVIDER_SITE_OTHER): Admitting: Emergency Medicine

## 2023-09-21 VITALS — BP 151/71 | HR 61 | Ht 65.0 in | Wt 202.0 lb

## 2023-09-21 DIAGNOSIS — J449 Chronic obstructive pulmonary disease, unspecified: Secondary | ICD-10-CM

## 2023-09-21 DIAGNOSIS — R918 Other nonspecific abnormal finding of lung field: Secondary | ICD-10-CM

## 2023-09-21 DIAGNOSIS — R911 Solitary pulmonary nodule: Secondary | ICD-10-CM

## 2023-09-21 DIAGNOSIS — R0609 Other forms of dyspnea: Secondary | ICD-10-CM

## 2023-09-21 DIAGNOSIS — I10 Essential (primary) hypertension: Secondary | ICD-10-CM | POA: Diagnosis not present

## 2023-09-21 DIAGNOSIS — J9611 Chronic respiratory failure with hypoxia: Secondary | ICD-10-CM | POA: Diagnosis not present

## 2023-09-21 DIAGNOSIS — J4530 Mild persistent asthma, uncomplicated: Secondary | ICD-10-CM

## 2023-09-21 MED ORDER — STIOLTO RESPIMAT 2.5-2.5 MCG/ACT IN AERS: 2.0000 | INHALATION_SPRAY | Freq: Every day | RESPIRATORY_TRACT | 11 refills | Status: DC

## 2023-09-21 NOTE — Progress Notes (Signed)
 Subjective:    Patient ID: Stephanie [name], female    DOB: 02-Mar-1935, 88 y.o.   MRN: [id number]  HPI Mr. Nicodemus is a 35 with a history of former tobacco use and associated COPD.  She has been followed for this by Dr. Darlean.  She has also been treated with SBRT for stage I non-small cell lung cancer in 02/2022 when right lower lobe navigational bronchoscopy showed atypical cells.  PMH also significant for paroxysmal atrial fibrillation, hypertension, TAVR for severe aortic stenosis, diabetes, CKD.  Subsequently had some interval increase in size and density in her right lower lobe nodule with associated right pleural effusion, question persistent malignancy.  Repeat bronchoscopy was planned for 09/21/2022 but it was canceled.  She was just admitted for an acute exacerbation of COPD, progressive dyspnea, acute on chronic orthopnea, hypoxemia.  Treated with corticosteroids, antibiotics.  Repeat CT chest was done 08/12/2023 in the [facility] emergency department as below. She feels better, approaching her baseline. She has exertional dyspnea if she walks down the hallway in her home. No real cough. She uses albuterol  nebs about every other day.   CT scan of the chest 08/12/2023 reviewed by me, shows no evidence of pulmonary embolism, no mediastinal or hilar adenopathy, tracheomalacia and a small hiatal hernia.  Small bilateral effusions with some mild associated atelectasis, unchanged right lower lobe mass lesion with an associated fiducial marker, somewhat smaller than on the most recent previous scan from a few weeks prior.     Review of Systems As per HPI  Past Medical History:  Diagnosis Date   Arthritis    Asthma    CKD (chronic kidney disease)    COPD (chronic obstructive pulmonary disease) (HCC)    Essential hypertension    Heart murmur    Mixed hyperlipidemia    PAF (paroxysmal atrial fibrillation) (HCC)    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   s/p TAVR with a  26 mm Edwards S3U via the TF approach by Dr. Wonda and Dr. Lucas.   Severe aortic stenosis    Symptomatic anemia    Presumed GI bleed January 2021 - she declined GI work-up   Type 2 diabetes mellitus (HCC)      Family History  Problem Relation Age of Onset   Lung disease Other    Cancer Other    Arthritis Other    Asthma Other    Diabetes Other    Diabetes Mother    Hypertension Mother    Diverticulitis Mother    Diabetes Father    Hypertension Sister    Diabetes Brother    Colon cancer Neg Hx      Social History   Socioeconomic History   Marital status: Divorced    Spouse name: Not on file   Number of children: 3   Years of education: Not on file   Highest education level: Not on file  Occupational History   Occupation: Retired-cook   Tobacco Use   Smoking status: Former    Current packs/day: 1.00    Types: Cigarettes    Passive exposure: Never   Smokeless tobacco: Never   Tobacco comments:    Quit over 40 years ago.  Does not know how many years she smoked or start/stop date  Vaping Use   Vaping status: Never Used  Substance and Sexual Activity   Alcohol use: No   Drug use: No   Sexual activity: Never  Other Topics Concern   Not on  file  Social History Narrative   ** Merged History Encounter **       Social Drivers of Health   Financial Resource Strain: Not on file  Food Insecurity: No Food Insecurity (09/03/2023)   Hunger Vital Sign    Worried About Running Out of Food in the Last Year: Never true    Ran Out of Food in the Last Year: Never true  Transportation Needs: No Transportation Needs (09/03/2023)   PRAPARE - Administrator, Civil Service (Medical): No    Lack of Transportation (Non-Medical): No  Physical Activity: Not on file  Stress: Not on file  Social Connections: Moderately Integrated (09/03/2023)   Social Connection and Isolation Panel    Frequency of Communication with Friends and Family: Twice a week    Frequency of  Social Gatherings with Friends and Family: Twice a week    Attends Religious Services: 1 to 4 times per year    Active Member of [employer] or Organizations: Yes    Attends Banker Meetings: 1 to 4 times per year    Marital Status: Divorced  Catering manager Violence: Not At Risk (09/03/2023)   Humiliation, Afraid, Rape, and Kick questionnaire    Fear of Current or Ex-Partner: No    Emotionally Abused: No    Physically Abused: No    Sexually Abused: No     Allergies  Allergen Reactions   Amoxicillin Swelling    Tongue swelling    Penicillins Swelling   Sulfa Antibiotics Swelling    Lip swelling   Nitrofurantoin Nausea And Vomiting     Outpatient Medications Prior to Visit  Medication Sig Dispense Refill   albuterol  (PROVENTIL ) (2.5 MG/3ML) 0.083% nebulizer solution Take 3 mLs (2.5 mg total) by nebulization every 2 (two) hours as needed for wheezing or shortness of breath. 75 mL 3   carvedilol  (COREG ) 6.25 MG tablet Take 6.25 mg by mouth 2 (two) times daily with a meal.     cetirizine (ZYRTEC) 10 MG tablet Take 10 mg by mouth daily.     cinacalcet (SENSIPAR) 30 MG tablet Take 30 mg by mouth daily.     FEROSUL 325 (65 Fe) MG tablet Take 325 mg by mouth daily.     furosemide  (LASIX ) 20 MG tablet Take 1 tablet (20 mg total) by mouth daily for 3 days, THEN 1 tablet (20 mg total) daily as needed (leg edema, shortness of breath, or weight gain.). (Patient taking differently: Take 1 -2 tablet (20 mg -40mg  total) by mouth daily for 3 days, THEN 1 tablet (20 mg total) daily as needed (leg edema, shortness of breath, or weight gain.).) 90 tablet 3   gabapentin  (NEURONTIN ) 100 MG capsule Take 100-200 mg by mouth at bedtime.     GNP VITAMIN D3 EXTRA STRENGTH 25 MCG (1000 UT) tablet Take 1,000 Units by mouth daily.     hydrALAZINE  (APRESOLINE ) 50 MG tablet Take 1 tablet (50 mg total) by mouth 3 (three) times daily. 90 tablet 5   isosorbide  mononitrate (IMDUR ) 30 MG 24 hr tablet Take 1  tablet (30 mg total) by mouth daily. 30 tablet 11   LANTUS SOLOSTAR 100 UNIT/ML Solostar Pen Inject 40 Units into the skin at bedtime.     valsartan  (DIOVAN ) 80 MG tablet Take 1 tablet (80 mg total) by mouth daily. 30 tablet 11   No facility-administered medications prior to visit.         Objective:   Physical Exam Vitals:  09/21/23 0844  BP: (!) 151/71  Pulse: 61  SpO2: 90%  Weight: 202 lb (91.6 kg)  Height: 5' 5 (1.651 m)   Gen: Pleasant, well-nourished, in no distress,  normal affect  ENT: No lesions,  mouth clear,  oropharynx clear, no postnasal drip  Neck: No JVD, no stridor  Lungs: No use of accessory muscles, no crackles or wheezing on normal respiration, no wheeze on forced expiration  Cardiovascular: RRR, heart sounds normal, no murmur or gallops, no peripheral edema  Musculoskeletal: No deformities, no cyanosis or clubbing  Neuro: alert, awake, non focal  Skin: Warm, no lesions or rash      Assessment & Plan:   Rt LL Lung mass Adenocarcinoma treated with SBRT 02/2022.  There has been some question subsequent enlargement on imaging but her most recent CT from 08/12/2023 look like it may have been slightly smaller.  Needs to be followed closely.  I think there would be value in checking a PET scan to look for interval change in size and also assess for hypermetabolism.  If there is suggestion of persistent malignancy then we would need to talk about either repeat biopsy or possibly further therapy depending on her existing biopsy if molecular testing is available, etc.  COPD GOLD 3 if use FEV1/VC Not currently on scheduled bronchodilator therapy.  She is willing to try Stiolto.  She uses albuterol  nebs as needed and she continue to use them this way.  Plan for a walking oximetry today given her persistent exertional shortness of breath.  Time spent 41 minutes  Lamar Chris, MD, PhD 09/21/2023, 5:24 PM [facility] [phone] or if no  answer before 7:00PM call [phone] For any issues after 7:00PM please call eLink [phone]

## 2023-09-21 NOTE — Assessment & Plan Note (Signed)
 Not currently on scheduled bronchodilator therapy.  She is willing to try Stiolto.  She uses albuterol  nebs as needed and she continue to use them this way.  Plan for a walking oximetry today given her persistent exertional shortness of breath.

## 2023-09-21 NOTE — Assessment & Plan Note (Signed)
 Adenocarcinoma treated with SBRT 02/2022.  There has been some question subsequent enlargement on imaging but her most recent CT from 08/12/2023 look like it may have been slightly smaller.  Needs to be followed closely.  I think there would be value in checking a PET scan to look for interval change in size and also assess for hypermetabolism.  If there is suggestion of persistent malignancy then we would need to talk about either repeat biopsy or possibly further therapy depending on her existing biopsy if molecular testing is available, etc.

## 2023-09-21 NOTE — Patient Instructions (Signed)
 We reviewed your CT scans of the chest today. We will plan to perform a PET scan at the end of October 2025 to further evaluate your right lower lobe. We will try starting Stiolto 2 puffs once daily to see if you get benefit.  Take this every day on a schedule.  Keep track of how it helps your breathing so we can discuss next time. Keep your albuterol  nebulizer available to use up to every 4 hours if needed for shortness of breath, chest tightness, wheezing. We will perform a walking oximetry today to see if you qualify for supplemental oxygen . Follow Dr. Shelah in early November so we can review your PET scan.

## 2023-09-24 ENCOUNTER — Inpatient Hospital Stay (HOSPITAL_COMMUNITY)
Admission: EM | Admit: 2023-09-24 | Discharge: 2023-09-26 | DRG: 291 | Disposition: A | Discharge status: Home / Self Care | Attending: Internal Medicine | Admitting: Internal Medicine

## 2023-09-24 ENCOUNTER — Inpatient Hospital Stay (HOSPITAL_COMMUNITY)

## 2023-09-24 ENCOUNTER — Encounter (HOSPITAL_COMMUNITY): Payer: Self-pay | Admitting: Emergency Medicine

## 2023-09-24 ENCOUNTER — Emergency Department (HOSPITAL_COMMUNITY)

## 2023-09-24 ENCOUNTER — Other Ambulatory Visit: Payer: Self-pay

## 2023-09-24 DIAGNOSIS — Z882 Allergy status to sulfonamides status: Secondary | ICD-10-CM

## 2023-09-24 DIAGNOSIS — I5031 Acute diastolic (congestive) heart failure: Secondary | ICD-10-CM | POA: Diagnosis present

## 2023-09-24 DIAGNOSIS — Z1152 Encounter for screening for COVID-19: Secondary | ICD-10-CM

## 2023-09-24 DIAGNOSIS — Z952 Presence of prosthetic heart valve: Secondary | ICD-10-CM

## 2023-09-24 DIAGNOSIS — E1122 Type 2 diabetes mellitus with diabetic chronic kidney disease: Secondary | ICD-10-CM | POA: Diagnosis present

## 2023-09-24 DIAGNOSIS — Z833 Family history of diabetes mellitus: Secondary | ICD-10-CM

## 2023-09-24 DIAGNOSIS — T502X5A Adverse effect of carbonic-anhydrase inhibitors, benzothiadiazides and other diuretics, initial encounter: Secondary | ICD-10-CM | POA: Diagnosis present

## 2023-09-24 DIAGNOSIS — J9602 Acute respiratory failure with hypercapnia: Secondary | ICD-10-CM | POA: Diagnosis present

## 2023-09-24 DIAGNOSIS — Z8261 Family history of arthritis: Secondary | ICD-10-CM

## 2023-09-24 DIAGNOSIS — Z923 Personal history of irradiation: Secondary | ICD-10-CM | POA: Diagnosis not present

## 2023-09-24 DIAGNOSIS — I480 Paroxysmal atrial fibrillation: Secondary | ICD-10-CM | POA: Diagnosis present

## 2023-09-24 DIAGNOSIS — Z8249 Family history of ischemic heart disease and other diseases of the circulatory system: Secondary | ICD-10-CM

## 2023-09-24 DIAGNOSIS — Z79899 Other long term (current) drug therapy: Secondary | ICD-10-CM

## 2023-09-24 DIAGNOSIS — Z880 Allergy status to penicillin: Secondary | ICD-10-CM

## 2023-09-24 DIAGNOSIS — F17210 Nicotine dependence, cigarettes, uncomplicated: Secondary | ICD-10-CM | POA: Diagnosis present

## 2023-09-24 DIAGNOSIS — R0602 Shortness of breath: Principal | ICD-10-CM

## 2023-09-24 DIAGNOSIS — Z809 Family history of malignant neoplasm, unspecified: Secondary | ICD-10-CM

## 2023-09-24 DIAGNOSIS — I10 Essential (primary) hypertension: Secondary | ICD-10-CM | POA: Diagnosis present

## 2023-09-24 DIAGNOSIS — E782 Mixed hyperlipidemia: Secondary | ICD-10-CM | POA: Diagnosis present

## 2023-09-24 DIAGNOSIS — I2489 Other forms of acute ischemic heart disease: Secondary | ICD-10-CM | POA: Diagnosis present

## 2023-09-24 DIAGNOSIS — I130 Hypertensive heart and chronic kidney disease with heart failure and stage 1 through stage 4 chronic kidney disease, or unspecified chronic kidney disease: Principal | ICD-10-CM | POA: Diagnosis present

## 2023-09-24 DIAGNOSIS — N1832 Chronic kidney disease, stage 3b: Secondary | ICD-10-CM | POA: Diagnosis present

## 2023-09-24 DIAGNOSIS — Z6833 Body mass index (BMI) 33.0-33.9, adult: Secondary | ICD-10-CM

## 2023-09-24 DIAGNOSIS — Z7951 Long term (current) use of inhaled steroids: Secondary | ICD-10-CM

## 2023-09-24 DIAGNOSIS — Z85118 Personal history of other malignant neoplasm of bronchus and lung: Secondary | ICD-10-CM | POA: Diagnosis not present

## 2023-09-24 DIAGNOSIS — E1169 Type 2 diabetes mellitus with other specified complication: Secondary | ICD-10-CM | POA: Diagnosis present

## 2023-09-24 DIAGNOSIS — E66811 Obesity, class 1: Secondary | ICD-10-CM | POA: Diagnosis present

## 2023-09-24 DIAGNOSIS — Z881 Allergy status to other antibiotic agents status: Secondary | ICD-10-CM

## 2023-09-24 DIAGNOSIS — J441 Chronic obstructive pulmonary disease with (acute) exacerbation: Secondary | ICD-10-CM | POA: Diagnosis present

## 2023-09-24 DIAGNOSIS — J9601 Acute respiratory failure with hypoxia: Secondary | ICD-10-CM | POA: Diagnosis present

## 2023-09-24 DIAGNOSIS — Z794 Long term (current) use of insulin: Secondary | ICD-10-CM

## 2023-09-24 DIAGNOSIS — Z8379 Family history of other diseases of the digestive system: Secondary | ICD-10-CM

## 2023-09-24 DIAGNOSIS — Z825 Family history of asthma and other chronic lower respiratory diseases: Secondary | ICD-10-CM

## 2023-09-24 DIAGNOSIS — I5020 Unspecified systolic (congestive) heart failure: Secondary | ICD-10-CM

## 2023-09-24 LAB — BASIC METABOLIC PANEL WITH GFR
Anion gap: 9 (ref 5–15)
BUN: 30 mg/dL — ABNORMAL HIGH (ref 8–23)
CO2: 27 mmol/L (ref 22–32)
Calcium: 8.1 mg/dL — ABNORMAL LOW (ref 8.9–10.3)
Chloride: 103 mmol/L (ref 98–111)
Creatinine, Ser: 1.89 mg/dL — ABNORMAL HIGH (ref 0.44–1.00)
GFR, Estimated: 26 mL/min — ABNORMAL LOW (ref >60)
Glucose, Bld: 70 mg/dL (ref 70–99)
Potassium: 4.9 mmol/L (ref 3.5–5.1)
Sodium: 139 mmol/L (ref 135–145)

## 2023-09-24 LAB — CBC
HCT: 35.5 % — ABNORMAL LOW (ref 36.0–46.0)
Hemoglobin: 11 g/dL — ABNORMAL LOW (ref 12.0–15.0)
MCH: 28.5 pg (ref 26.0–34.0)
MCHC: 31 g/dL (ref 30.0–36.0)
MCV: 92 fL (ref 80.0–100.0)
Platelets: 180 K/uL (ref 150–400)
RBC: 3.86 MIL/uL — ABNORMAL LOW (ref 3.87–5.11)
RDW: 14.9 % (ref 11.5–15.5)
WBC: 11 K/uL — ABNORMAL HIGH (ref 4.0–10.5)
nRBC: 0 % (ref 0.0–0.2)

## 2023-09-24 LAB — BRAIN NATRIURETIC PEPTIDE: B Natriuretic Peptide: 724 pg/mL — ABNORMAL HIGH (ref 0.0–100.0)

## 2023-09-24 LAB — MRSA NEXT GEN BY PCR, NASAL: MRSA by PCR Next Gen: NOT DETECTED

## 2023-09-24 LAB — TROPONIN I (HIGH SENSITIVITY)
Troponin I (High Sensitivity): 122 ng/L (ref <18)
Troponin I (High Sensitivity): 135 ng/L (ref <18)

## 2023-09-24 LAB — PROCALCITONIN: Procalcitonin: <0.1 ng/mL

## 2023-09-24 MED ORDER — CINACALCET HCL 30 MG PO TABS
30.0000 mg | ORAL_TABLET | Freq: Every day | ORAL | Status: DC
  Administered 2023-09-25 – 2023-09-26 (×2): 30 mg via ORAL
  Filled 2023-09-24 (×2): qty 1

## 2023-09-24 MED ORDER — ARFORMOTEROL TARTRATE 15 MCG/2ML IN NEBU
15.0000 ug | INHALATION_SOLUTION | Freq: Two times a day (BID) | RESPIRATORY_TRACT | Status: DC
  Administered 2023-09-24 – 2023-09-26 (×4): 15 ug via RESPIRATORY_TRACT
  Filled 2023-09-24 (×4): qty 2

## 2023-09-24 MED ORDER — FERROUS SULFATE 325 (65 FE) MG PO TABS
325.0000 mg | ORAL_TABLET | Freq: Every day | ORAL | Status: DC
  Administered 2023-09-24 – 2023-09-26 (×3): 325 mg via ORAL
  Filled 2023-09-24 (×3): qty 1

## 2023-09-24 MED ORDER — CARVEDILOL 3.125 MG PO TABS
6.2500 mg | ORAL_TABLET | Freq: Two times a day (BID) | ORAL | Status: DC
  Administered 2023-09-24 – 2023-09-26 (×4): 6.25 mg via ORAL
  Filled 2023-09-24 (×4): qty 2

## 2023-09-24 MED ORDER — GABAPENTIN 100 MG PO CAPS
100.0000 mg | ORAL_CAPSULE | Freq: Every day | ORAL | Status: DC
  Administered 2023-09-24 – 2023-09-25 (×2): 100 mg via ORAL
  Filled 2023-09-24 (×2): qty 1

## 2023-09-24 MED ORDER — HEPARIN SODIUM (PORCINE) 5000 UNIT/ML IJ SOLN
5000.0000 [IU] | Freq: Three times a day (TID) | INTRAMUSCULAR | Status: DC
Start: 2023-09-24 — End: 2023-09-24

## 2023-09-24 MED ORDER — BUDESONIDE 0.5 MG/2ML IN SUSP
0.5000 mg | Freq: Two times a day (BID) | RESPIRATORY_TRACT | Status: DC
  Administered 2023-09-24 – 2023-09-26 (×4): 0.5 mg via RESPIRATORY_TRACT
  Filled 2023-09-24 (×4): qty 2

## 2023-09-24 MED ORDER — IPRATROPIUM-ALBUTEROL 0.5-2.5 (3) MG/3ML IN SOLN
3.0000 mL | Freq: Four times a day (QID) | RESPIRATORY_TRACT | Status: DC
  Administered 2023-09-24 – 2023-09-25 (×5): 3 mL via RESPIRATORY_TRACT
  Filled 2023-09-24 (×5): qty 3

## 2023-09-24 MED ORDER — SODIUM CHLORIDE 0.9% FLUSH: 3.0000 mL | INTRAVENOUS | Status: DC | PRN

## 2023-09-24 MED ORDER — METHYLPREDNISOLONE SODIUM SUCC 125 MG IJ SOLR
60.0000 mg | Freq: Two times a day (BID) | INTRAMUSCULAR | Status: DC
  Administered 2023-09-24 – 2023-09-26 (×4): 60 mg via INTRAVENOUS
  Filled 2023-09-24 (×4): qty 2

## 2023-09-24 MED ORDER — ISOSORBIDE MONONITRATE ER 30 MG PO TB24
30.0000 mg | ORAL_TABLET | Freq: Every day | ORAL | Status: DC
  Administered 2023-09-24 – 2023-09-26 (×3): 30 mg via ORAL
  Filled 2023-09-24 (×3): qty 1

## 2023-09-24 MED ORDER — HEPARIN SODIUM (PORCINE) 5000 UNIT/ML IJ SOLN
5000.0000 [IU] | Freq: Three times a day (TID) | INTRAMUSCULAR | Status: DC
  Administered 2023-09-24 – 2023-09-26 (×5): 5000 [IU] via SUBCUTANEOUS
  Filled 2023-09-24 (×5): qty 1

## 2023-09-24 MED ORDER — ACETAMINOPHEN 325 MG PO TABS
650.0000 mg | ORAL_TABLET | ORAL | Status: DC | PRN
  Administered 2023-09-24: 650 mg via ORAL
  Filled 2023-09-24: qty 2

## 2023-09-24 MED ORDER — ONDANSETRON HCL 4 MG/2ML IJ SOLN: 4.0000 mg | Freq: Four times a day (QID) | INTRAMUSCULAR | Status: DC | PRN

## 2023-09-24 MED ORDER — LORATADINE 10 MG PO TABS
10.0000 mg | ORAL_TABLET | Freq: Every day | ORAL | Status: DC
  Administered 2023-09-25 – 2023-09-26 (×2): 10 mg via ORAL
  Filled 2023-09-24 (×2): qty 1

## 2023-09-24 MED ORDER — PNEUMOCOCCAL 20-VAL CONJ VACC 0.5 ML IM SUSY: 0.5000 mL | PREFILLED_SYRINGE | INTRAMUSCULAR | Status: DC

## 2023-09-24 MED ORDER — SODIUM CHLORIDE 0.9 % IV SOLN: 250.0000 mL | INTRAVENOUS | Status: AC | PRN

## 2023-09-24 MED ORDER — FUROSEMIDE 10 MG/ML IJ SOLN
40.0000 mg | Freq: Once | INTRAMUSCULAR | Status: AC
  Administered 2023-09-24: 40 mg via INTRAVENOUS
  Filled 2023-09-24: qty 4

## 2023-09-24 MED ORDER — SODIUM CHLORIDE 0.9% FLUSH
3.0000 mL | Freq: Two times a day (BID) | INTRAVENOUS | Status: DC
  Administered 2023-09-24 – 2023-09-26 (×5): 3 mL via INTRAVENOUS

## 2023-09-24 MED ORDER — HYDRALAZINE HCL 50 MG PO TABS
50.0000 mg | ORAL_TABLET | Freq: Three times a day (TID) | ORAL | Status: DC
  Administered 2023-09-24 – 2023-09-26 (×6): 50 mg via ORAL
  Filled 2023-09-24 (×7): qty 1

## 2023-09-24 NOTE — Hospital Course (Signed)
 88 year old female with a history of asthma/COPD, paroxysmal atrial fibrillation, CKD stage III, aortic stenosis status post TAVR 2022, diabetes mellitus type 2, hypertension, hyperlipidemia, lung adenocarcinoma status post SBRT 02/2022, GI bleed presenting with 2-day history of shortness of breath. The patient was recently mated to the hospital from 09/03/2023 to 09/08/2023 when she was treated for COPD exacerbation and right lower lobe pneumonia.  She was discharged home with a prednisone  taper, cefdinir , and azithromycin .  She was also given 3 days of furosemide  at the time of discharge and then to use it as needed.  The patient stated that she felt great after discharge.  However in the past 2 days she has had increasing shortness of breath.  She denies any worsening lower extremity edema but complains of some orthopnea.  She does have some chest discomfort initially, but has not had any chest discomfort today.  She denies any dizziness or syncope.  She has a nonproductive cough.  She denies any hemoptysis.  She has not had any fever, chills, nausea, vomiting, diarrhea, abdominal pain. The patient has been using her nebulizer at home without much improvement.  EMS was activated.  The patient was given Solu-Medrol  and bronchodilators on the way to the hospital.  Notably, the patient went to see pulmonology, Dr. Shelah on 09/21/2023.  He felt that the patient's most recent CT on 08/12/2023 right lower lobe mass was actually smaller.  Plans are for a PET scan, which if positive plan is for biopsy versus further therapy from prior biopsy.  In the ED, the patient was afebrile and hemodynamically stable with oxygen  saturation 86% on room air.  She was placed on 2 L with saturation 95%. WBC 11.0, hemoglobin 11.0, platelets 180.  Sodium 139, potassium 4.9, bicarbonate 27, serum creatinine 1.89.  Chest x-ray showed hazy reticular opacities in the basilar area.  The patient was given furosemide  40 mg IV in the emergency  department.

## 2023-09-24 NOTE — Plan of Care (Signed)

## 2023-09-24 NOTE — H&P (Signed)
 History and Physical    Patient: Stephanie [name] FMW:[id number] DOB: 1935/12/07 DOA: 09/24/2023 DOS: the patient was seen and examined on 09/24/2023 PCP: [facility]  Patient coming from: Home  Chief Complaint:  Chief Complaint  Patient presents with   Shortness of Breath   HPI: Stephanie [name] is a 88 year old female with a history of asthma/COPD, paroxysmal atrial fibrillation, CKD stage III, aortic stenosis status post TAVR 2022, diabetes mellitus type 2, hypertension, hyperlipidemia, lung adenocarcinoma status post SBRT 02/2022, GI bleed presenting with 2-day history of shortness of breath. The patient was recently mated to the hospital from 09/03/2023 to 09/08/2023 when she was treated for COPD exacerbation and right lower lobe pneumonia.  She was discharged home with a prednisone  taper, cefdinir , and azithromycin .  She was also given 3 days of furosemide  at the time of discharge and then to use it as needed.  The patient stated that she felt great after discharge.  However in the past 2 days she has had increasing shortness of breath.  She denies any worsening lower extremity edema but complains of some orthopnea.  She does have some chest discomfort initially, but has not had any chest discomfort today.  She denies any dizziness or syncope.  She has a nonproductive cough.  She denies any hemoptysis.  She has not had any fever, chills, nausea, vomiting, diarrhea, abdominal pain. The patient has been using her nebulizer at home without much improvement.  EMS was activated.  The patient was given Solu-Medrol  and bronchodilators on the way to the hospital.  Notably, the patient went to see pulmonology, Dr. Shelah on 09/21/2023.  He felt that the patient's most recent CT on 08/12/2023 right lower lobe mass was actually smaller.  Plans are for a PET scan, which if positive plan is for biopsy versus further therapy from prior biopsy.  In the ED, the patient was afebrile and  hemodynamically stable with oxygen  saturation 86% on room air.  She was placed on 2 L with saturation 95%. WBC 11.0, hemoglobin 11.0, platelets 180.  Sodium 139, potassium 4.9, bicarbonate 27, serum creatinine 1.89.  Chest x-ray showed hazy reticular opacities in the basilar area.  The patient was given furosemide  40 mg IV in the emergency department.  Review of Systems: As mentioned in the history of present illness. All other systems reviewed and are negative. 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 Wonda and Dr. Lucas.   Severe aortic stenosis    Symptomatic anemia    Presumed GI bleed January 2021 - she declined GI work-up   Type 2 diabetes mellitus [facility])    Past Surgical History:  Procedure Laterality Date   APPENDECTOMY     BACK SURGERY     BRONCHIAL BIOPSY  03/02/2022   Procedure: BRONCHIAL BIOPSIES;  Surgeon: Brenna Adine LITTIE, DO;  Location: [facility] ENDOSCOPY;  Service: Pulmonary;;   BRONCHIAL BRUSHINGS  03/02/2022   Procedure: BRONCHIAL BRUSHINGS;  Surgeon: Brenna Adine LITTIE, DO;  Location: [facility] ENDOSCOPY;  Service: Pulmonary;;   BRONCHIAL NEEDLE ASPIRATION BIOPSY  03/02/2022  Procedure: BRONCHIAL NEEDLE ASPIRATION BIOPSIES;  Surgeon: Brenna Adine CROME, DO;  Location: [facility] ENDOSCOPY;  Service: Pulmonary;;   CYST REMOVAL HAND     Right, hospital in [place]   FIDUCIAL MARKER PLACEMENT  03/02/2022   Procedure: FIDUCIAL MARKER PLACEMENT;  Surgeon: Brenna Adine CROME, DO;  Location: [facility] ENDOSCOPY;  Service: Pulmonary;;   INTRAOPERATIVE TRANSTHORACIC ECHOCARDIOGRAM Left 07/01/2020   Procedure: INTRAOPERATIVE TRANSTHORACIC ECHOCARDIOGRAM;  Surgeon: Wonda Sharper, MD;  Location: [facility] OR;  Service: Open Heart Surgery;  Laterality: Left;   KNEE ARTHROSCOPY WITH MEDIAL MENISECTOMY Right 02/28/2012   Procedure: KNEE ARTHROSCOPY WITH MEDIAL MENISECTOMY;  Surgeon: Taft FORBES Minerva, MD;  Location: [facility] ORS;  Service: Orthopedics;  Laterality: Right;   REPAIR KNEE LIGAMENT     RIGHT/LEFT HEART CATH AND CORONARY ANGIOGRAPHY N/A 04/10/2020   Procedure: RIGHT/LEFT HEART CATH AND CORONARY ANGIOGRAPHY;  Surgeon: Dann Candyce RAMAN, MD;  Location: [facility] INVASIVE CV LAB;  Service: Cardiovascular;  Laterality: N/A;   TRANSCATHETER AORTIC VALVE REPLACEMENT, TRANSFEMORAL N/A 07/01/2020   Procedure: TRANSCATHETER AORTIC VALVE REPLACEMENT, TRANSFEMORAL;  Surgeon: Wonda Sharper, MD;  Location: [facility] OR;  Service: Open Heart Surgery;  Laterality: N/A;   ULTRASOUND GUIDANCE FOR VASCULAR ACCESS Bilateral 07/01/2020   Procedure: ULTRASOUND GUIDANCE FOR VASCULAR ACCESS;  Surgeon: Wonda Sharper, MD;  Location: [facility] OR;  Service: Open Heart Surgery;  Laterality: Bilateral;   Social History:  reports that she has quit smoking. Her smoking use included cigarettes. She has never been exposed to tobacco smoke. She has never used smokeless tobacco. She reports that she does not drink alcohol and does not use drugs.  Allergies  Allergen Reactions   Amoxicillin Swelling    Tongue swelling    Penicillins Swelling   Sulfa Antibiotics Swelling    Lip swelling   Nitrofurantoin Nausea And Vomiting    Family History  Problem Relation Age of Onset   Lung disease Other    Cancer Other    Arthritis Other    Asthma Other    Diabetes Other    Diabetes Mother    Hypertension Mother    Diverticulitis Mother    Diabetes Father    Hypertension Sister    Diabetes Brother    Colon cancer Neg Hx     Prior to Admission medications   Medication Sig Start Date End Date Taking? Authorizing Provider  albuterol  (PROVENTIL ) (2.5 MG/3ML) 0.083% nebulizer solution Take 3 mLs (2.5 mg total) by  nebulization every 2 (two) hours as needed for wheezing or shortness of breath. 08/12/23   Emokpae, Courage, MD  carvedilol  (COREG ) 6.25 MG tablet Take 6.25 mg by mouth 2 (two) times daily with a meal.    [provider]  cetirizine (ZYRTEC) 10 MG tablet Take 10 mg by mouth daily.    [provider]  cinacalcet  (SENSIPAR ) 30 MG tablet Take 30 mg by mouth daily. 03/31/23   [provider]  FEROSUL 325 (65 Fe) MG tablet Take 325 mg by mouth daily.    [provider]  furosemide  (LASIX ) 20 MG tablet Take 1 tablet (20 mg total) by mouth daily for 3 days, THEN 1 tablet (20 mg total) daily as needed (leg edema, shortness of breath, or weight gain.). Patient taking differently: Take 1 -2 tablet (20 mg -40mg  total) by mouth daily for 3 days, THEN 1 tablet (20 mg total) daily as needed (leg edema, shortness of breath, or weight gain.). 08/30/23 12/01/23  Miriam Norris, NP  gabapentin  (NEURONTIN ) 100 MG capsule Take 100-200  mg by mouth at bedtime. 10/02/19   [provider]  GNP VITAMIN D3 EXTRA STRENGTH 25 MCG (1000 UT) tablet Take 1,000 Units by mouth daily. 03/31/23   [provider]  hydrALAZINE  (APRESOLINE ) 50 MG tablet Take 1 tablet (50 mg total) by mouth 3 (three) times daily. 08/12/23   Pearlean Manus, MD  isosorbide  mononitrate (IMDUR ) 30 MG 24 hr tablet Take 1 tablet (30 mg total) by mouth daily. 08/12/23 08/11/24  Pearlean Manus, MD  LANTUS SOLOSTAR 100 UNIT/ML Solostar Pen Inject 40 Units into the skin at bedtime.    [provider]  Tiotropium Bromide-Olodaterol (STIOLTO RESPIMAT ) 2.5-2.5 MCG/ACT AERS Inhale 2 puffs into the lungs daily. 09/21/23   Shelah Lamar RAMAN, MD  valsartan  (DIOVAN ) 80 MG tablet Take 1 tablet (80 mg total) by mouth daily. 09/01/23   Darlean Ozell NOVAK, MD    Physical Exam: Vitals:   09/24/23 0943 09/24/23 1000 09/24/23 1030 09/24/23 1100  BP: (!) 187/99 (!) 182/63 (!) 188/84 (!) 171/81  Pulse: 62 (!) 55 61 (!) 55   Resp: (!) 22     Temp: 98.1 F (36.7 C)     TempSrc: Oral     SpO2: 95% 98% 95% 96%  Weight:      Height:       GENERAL:  A&O x 3, NAD, well developed, cooperative, follows commands HEENT: [place]/AT, No thrush, No icterus, No oral ulcers Neck:  No neck mass, No meningismus, soft, supple CV: RRR, no S3, no S4, no rub, no JVD Lungs: Bibasilar rales.  Mild bibasilar wheezing. Abd: soft/NT +BS, nondistended Ext: 1 + LE edema, no lymphangitis, no cyanosis, no rashes Neuro:  CN II-XII intact, strength 4/5 in RUE, RLE, strength 4/5 LUE, LLE; sensation intact bilateral; no dysmetria; babinski equivocal  Data Reviewed: Labs reviewed above in the history Assessment and Plan: Acute respiratory failure with hypoxia - Secondary to COPD exacerbation and fluid overload - Presented with tachypnea and saturation 86% on room air. - Stable on 2 L - Wean oxygen  as tolerated back to room air - CT chest - VBG  COPD exacerbation - Start Pulmicort  - Start Brovana  - Start DuoNebs - Continue IV Solu-Medrol  - Check COVID - Check viral respiratory panel  Acute HFpEF - 09/23/2022 echo EF 55 to 60%, no WMA, grade 1 DD, severe LVH, normal RVF, stable TAVR - Continue IV furosemide  - Repeat echo - Accurate I's and O's  CKD stage IIIb - Baseline creatinine 1.5-1.7 - Monitor diuresis  Elevated troponin -troponin 122>>136 -due to demand ischemia -no chest pain  Paroxysmal atrial fibrillation - She is not on anticoagulation secondary to history of GI bleed - Continue carvedilol   Essential hypertension - Continue carvedilol , hydralazine , Imdur  - Holding ARB in the setting of CKD and diuresis  Diabetes mellitus type 2, controlled - 04/07/2023 hemoglobin A1c 6.1 - Repeat hemoglobin A1c - NovoLog  sliding scale -Reduced dose Semglee  Obesity class I - BMI 33.75 - Lifestyle modification   Advance Care Planning: FULL  Consults: none  Family Communication: daughter 9/6  Severity of  Illness: The appropriate patient status for this patient is INPATIENT. Inpatient status is judged to be reasonable and necessary in order to provide the required intensity of service to ensure the patient's safety. The patient's presenting symptoms, physical exam findings, and initial radiographic and laboratory data in the context of their chronic comorbidities is felt to place them at high risk for further clinical deterioration. Furthermore, it is not anticipated that the patient  will be medically stable for discharge from the hospital within 2 midnights of admission.   * I certify that at the point of admission it is my clinical judgment that the patient will require inpatient hospital care spanning beyond 2 midnights from the point of admission due to high intensity of service, high risk for further deterioration and high frequency of surveillance required.*  Author: Alm Schneider, MD 09/24/2023 12:01 PM  For on call review [email or web address].

## 2023-09-24 NOTE — ED Triage Notes (Signed)
 Pt bib EMS for c/o SOB (worse when lying down) since 3 am. EMS reports they were called to pt's house at 3am for SOB and administered 2 Duonebs. Pt was not transported at that time as she told EMS she felt better. Pt then called again and EMS transported here. EMS reports pt not on O2 and had sats of 93% on R/A upon their arrival as well as had wheezes throughout. EMS gave pt another Duoneb and 125mg  Solumedrol IM PTA. EMS also reports pt had significant dyspnea with exertion.

## 2023-09-24 NOTE — ED Provider Notes (Signed)
 [facility]-EMERGENCY DEPT [facility] Emergency Department Provider Note MRN:  [id number]  Arrival date & time: 09/24/23     Chief Complaint   Shortness of Breath   History of Present Illness   Stephanie [name] is a 88 y.o. year-old female with a history of COPD, CKD, diabetes, TAVR presenting to the ED with chief complaint of shortness of breath.  Shortness of breath since 3 AM.  Worse when trying to lay flat.  Feeling better after DuoNebs and steroids from EMS.  Review of Systems  A thorough review of systems was obtained and all systems are negative except as noted in the HPI and PMH.   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 Wonda and Dr. Lucas.   Severe aortic stenosis    Symptomatic anemia    Presumed GI bleed January 2021 - she declined GI work-up   Type 2 diabetes mellitus [facility])     Past Surgical History:  Procedure Laterality Date   APPENDECTOMY     BACK SURGERY     BRONCHIAL BIOPSY  03/02/2022   Procedure: BRONCHIAL BIOPSIES;  Surgeon: Brenna Adine LITTIE, DO;  Location: [facility] ENDOSCOPY;  Service: Pulmonary;;   BRONCHIAL BRUSHINGS  03/02/2022   Procedure: BRONCHIAL BRUSHINGS;  Surgeon: Brenna Adine LITTIE, DO;  Location: [facility] ENDOSCOPY;  Service: Pulmonary;;   BRONCHIAL NEEDLE ASPIRATION BIOPSY  03/02/2022   Procedure: BRONCHIAL NEEDLE ASPIRATION BIOPSIES;  Surgeon: Brenna Adine LITTIE, DO;  Location: [facility] ENDOSCOPY;  Service: Pulmonary;;   CYST REMOVAL HAND     Right, hospital in [place]   FIDUCIAL MARKER PLACEMENT  03/02/2022   Procedure: FIDUCIAL MARKER PLACEMENT;  Surgeon: Brenna Adine LITTIE, DO;  Location: [facility]  ENDOSCOPY;  Service: Pulmonary;;   INTRAOPERATIVE TRANSTHORACIC ECHOCARDIOGRAM Left 07/01/2020   Procedure: INTRAOPERATIVE TRANSTHORACIC ECHOCARDIOGRAM;  Surgeon: Wonda Sharper, MD;  Location: [facility] OR;  Service: Open Heart Surgery;  Laterality: Left;   KNEE ARTHROSCOPY WITH MEDIAL MENISECTOMY Right 02/28/2012   Procedure: KNEE ARTHROSCOPY WITH MEDIAL MENISECTOMY;  Surgeon: Taft FORBES Minerva, MD;  Location: [facility] ORS;  Service: Orthopedics;  Laterality: Right;   REPAIR KNEE LIGAMENT     RIGHT/LEFT HEART CATH AND CORONARY ANGIOGRAPHY N/A 04/10/2020   Procedure: RIGHT/LEFT HEART CATH AND CORONARY ANGIOGRAPHY;  Surgeon: Dann Candyce RAMAN, MD;  Location: [facility] INVASIVE CV LAB;  Service: Cardiovascular;  Laterality: N/A;   TRANSCATHETER AORTIC VALVE REPLACEMENT, TRANSFEMORAL N/A 07/01/2020   Procedure: TRANSCATHETER AORTIC VALVE REPLACEMENT, TRANSFEMORAL;  Surgeon: Wonda Sharper, MD;  Location: [facility] OR;  Service: Open Heart Surgery;  Laterality: N/A;   ULTRASOUND GUIDANCE FOR VASCULAR ACCESS Bilateral 07/01/2020   Procedure: ULTRASOUND GUIDANCE FOR VASCULAR ACCESS;  Surgeon: Wonda Sharper, MD;  Location: [facility] OR;  Service: Open Heart Surgery;  Laterality: Bilateral;    Family History  Problem Relation Age of Onset   Lung disease Other    Cancer Other    Arthritis Other    Asthma Other    Diabetes Other    Diabetes Mother    Hypertension Mother    Diverticulitis Mother    Diabetes  Father    Hypertension Sister    Diabetes Brother    Colon cancer Neg Hx     Social History   Socioeconomic History   Marital status: Divorced    Spouse name: Not on file   Number of children: 3   Years of education: Not on file   Highest education level: Not on file  Occupational History   Occupation: Retired-cook   Tobacco Use   Smoking status: Former    Current packs/day: 1.00    Types: Cigarettes    Passive exposure: Never   Smokeless tobacco: Never   Tobacco comments:    Quit over 40 years ago.  Does not  know how many years she smoked or start/stop date  Vaping Use   Vaping status: Never Used  Substance and Sexual Activity   Alcohol use: No   Drug use: No   Sexual activity: Never  Other Topics Concern   Not on file  Social History Narrative   ** Merged History Encounter **       Social Drivers of Health   Financial Resource Strain: Not on file  Food Insecurity: No Food Insecurity (09/03/2023)   Hunger Vital Sign    Worried About Running Out of Food in the Last Year: Never true    Ran Out of Food in the Last Year: Never true  Transportation Needs: No Transportation Needs (09/03/2023)   PRAPARE - Administrator, Civil Service (Medical): No    Lack of Transportation (Non-Medical): No  Physical Activity: Not on file  Stress: Not on file  Social Connections: Moderately Integrated (09/03/2023)   Social Connection and Isolation Panel    Frequency of Communication with Friends and Family: Twice a week    Frequency of Social Gatherings with Friends and Family: Twice a week    Attends Religious Services: 1 to 4 times per year    Active Member of [employer] or Organizations: Yes    Attends Banker Meetings: 1 to 4 times per year    Marital Status: Divorced  Catering manager Violence: Not At Risk (09/03/2023)   Humiliation, Afraid, Rape, and Kick questionnaire    Fear of Current or Ex-Partner: No    Emotionally Abused: No    Physically Abused: No    Sexually Abused: No     Physical Exam   Vitals:   09/24/23 0628 09/24/23 0630  BP: (!) 183/101   Pulse: 64 60  Resp: (!) 26 (!) 21  Temp:  98.3 F (36.8 C)  SpO2: (!) 86% 95%    CONSTITUTIONAL: Well-appearing, NAD NEURO/PSYCH:  Alert and oriented x 3, no focal deficits EYES:  eyes equal and reactive ENT/NECK:  no LAD, no JVD CARDIO: Regular rate, well-perfused, normal S1 and S2 PULM:  CTAB no wheezing or rhonchi GI/GU:  non-distended, non-tender MSK/SPINE:  No gross deformities, no edema SKIN:  no rash,  atraumatic   *Additional and/or pertinent findings included in MDM below  Diagnostic and Interventional Summary    EKG Interpretation Date/Time:  Saturday September 24 2023 06:25:37 EDT Ventricular Rate:  62 PR Interval:  192 QRS Duration:  110 QT Interval:  430 QTC Calculation: 437 R Axis:   -83  Text Interpretation: Sinus or ectopic atrial rhythm Ventricular premature complex LAD, consider left anterior fascicular block Probable anteroseptal infarct, recent Confirmed by Theadore Sharper [phone]) on 09/24/2023 6:50:55 AM       Labs Reviewed  BASIC METABOLIC PANEL WITH GFR - Abnormal; Notable for the  following components:      Result Value   BUN 30 (*)    Creatinine, Ser 1.89 (*)    Calcium  8.1 (*)    GFR, Estimated 26 (*)    All other components within normal limits  CBC - Abnormal; Notable for the following components:   WBC 11.0 (*)    RBC 3.86 (*)    Hemoglobin 11.0 (*)    HCT 35.5 (*)    All other components within normal limits  BRAIN NATRIURETIC PEPTIDE  TROPONIN I (HIGH SENSITIVITY)    DG Chest Portable 1 View  Final Result      Medications - No data to display   Procedures  /  Critical Care Procedures  ED Course and Medical Decision Making  Initial Impression and Ddx Patient is in no acute distress on my assessment, mild hypertension, had some chest pain yesterday but not currently.  Shortness of breath is much improved after EMS interventions.  Question CHF versus COPD exacerbation.  ACS also considered but felt to be less likely.  Patient is requiring oxygen , 1 L nasal cannula with saturations in the low 90s.  Past medical/surgical history that increases complexity of ED encounter: CHF, COPD  Interpretation of Diagnostics I personally reviewed the EKG and my interpretation is as follows: Sinus rhythm with PAC  Labs pending  Patient Reassessment and Ultimate Disposition/Management     Patient suspected to need hospitalist admission for new oxygen   requirement pending labs.  Signed out to oncoming provider at shift change.  Patient management required discussion with the following services or consulting groups:  Hospitalist Service  Complexity of Problems Addressed Acute illness or injury that poses threat of life of bodily function  Additional Data Reviewed and Analyzed Further history obtained from: EMS on arrival and Prior labs/imaging results  Additional Factors Impacting ED Encounter Risk Consideration of hospitalization  Ozell HERO. Theadore, MD [facility] Emergency Medicine [facility] [email or web address]  Final Clinical Impressions(s) / ED Diagnoses     ICD-10-CM   1. SOB (shortness of breath)  R06.02       ED Discharge Orders     None        Discharge Instructions Discussed with and Provided to Patient:   Discharge Instructions   None      Theadore Ozell HERO, MD 09/24/23 [phone]

## 2023-09-25 ENCOUNTER — Inpatient Hospital Stay (HOSPITAL_COMMUNITY)

## 2023-09-25 DIAGNOSIS — J441 Chronic obstructive pulmonary disease with (acute) exacerbation: Secondary | ICD-10-CM | POA: Diagnosis not present

## 2023-09-25 DIAGNOSIS — Z952 Presence of prosthetic heart valve: Secondary | ICD-10-CM | POA: Diagnosis not present

## 2023-09-25 DIAGNOSIS — J9601 Acute respiratory failure with hypoxia: Secondary | ICD-10-CM | POA: Diagnosis not present

## 2023-09-25 DIAGNOSIS — N1832 Chronic kidney disease, stage 3b: Secondary | ICD-10-CM | POA: Diagnosis not present

## 2023-09-25 DIAGNOSIS — I5031 Acute diastolic (congestive) heart failure: Secondary | ICD-10-CM | POA: Diagnosis not present

## 2023-09-25 LAB — ECHOCARDIOGRAM COMPLETE
AR max vel: 1.57 cm2
AV Area VTI: 1.67 cm2
AV Area mean vel: 1.7 cm2
AV Mean grad: 11 mmHg
AV Peak grad: 20.8 mmHg
Ao pk vel: 2.28 m/s
Area-P 1/2: 3.53 cm2
Height: 65 in
MV VTI: 1.23 cm2
S' Lateral: 2.7 cm
Weight: 3171.1 [oz_av]

## 2023-09-25 LAB — RESPIRATORY PANEL BY PCR

## 2023-09-25 LAB — BLOOD GAS, VENOUS
Acid-Base Excess: 3.7 mmol/L — ABNORMAL HIGH (ref 0.0–2.0)
Bicarbonate: 30.5 mmol/L — ABNORMAL HIGH (ref 20.0–28.0)
Drawn by: 1517
O2 Saturation: 95.7 %
Patient temperature: 37.1
pCO2, Ven: 54 mmHg (ref 44–60)
pH, Ven: 7.36 (ref 7.25–7.43)
pO2, Ven: 68 mmHg — ABNORMAL HIGH (ref 32–45)

## 2023-09-25 LAB — MAGNESIUM: Magnesium: 1.9 mg/dL (ref 1.7–2.4)

## 2023-09-25 LAB — BASIC METABOLIC PANEL WITH GFR
Anion gap: 11 (ref 5–15)
BUN: 43 mg/dL — ABNORMAL HIGH (ref 8–23)
CO2: 25 mmol/L (ref 22–32)
Calcium: 7.9 mg/dL — ABNORMAL LOW (ref 8.9–10.3)
Chloride: 99 mmol/L (ref 98–111)
Creatinine, Ser: 2.06 mg/dL — ABNORMAL HIGH (ref 0.44–1.00)
GFR, Estimated: 23 mL/min — ABNORMAL LOW (ref >60)
Glucose, Bld: 204 mg/dL — ABNORMAL HIGH (ref 70–99)
Potassium: 5 mmol/L (ref 3.5–5.1)
Sodium: 135 mmol/L (ref 135–145)

## 2023-09-25 LAB — RESP PANEL BY RT-PCR (RSV, FLU A&B, COVID)  RVPGX2
Influenza A by PCR: NEGATIVE
Influenza B by PCR: NEGATIVE
Resp Syncytial Virus by PCR: NEGATIVE
SARS Coronavirus 2 by RT PCR: NEGATIVE

## 2023-09-25 MED ORDER — PNEUMOCOCCAL 20-VAL CONJ VACC 0.5 ML IM SUSY: 0.5000 mL | PREFILLED_SYRINGE | INTRAMUSCULAR | Status: DC | PRN

## 2023-09-25 MED ORDER — IPRATROPIUM-ALBUTEROL 0.5-2.5 (3) MG/3ML IN SOLN
3.0000 mL | Freq: Three times a day (TID) | RESPIRATORY_TRACT | Status: DC
  Administered 2023-09-26: 3 mL via RESPIRATORY_TRACT
  Filled 2023-09-25: qty 3

## 2023-09-25 NOTE — Plan of Care (Signed)
  Problem: Health Behavior/Discharge Planning: Goal: Ability to manage health-related needs will improve Outcome: Progressing   Problem: Clinical Measurements: Goal: Ability to maintain clinical measurements within normal limits will improve Outcome: Progressing Goal: Will remain free from infection Outcome: Progressing Goal: Diagnostic test results will improve Outcome: Progressing Goal: Respiratory complications will improve Outcome: Progressing Goal: Cardiovascular complication will be avoided Outcome: Progressing   Problem: Clinical Measurements: Goal: Ability to maintain clinical measurements within normal limits will improve Outcome: Progressing Goal: Will remain free from infection Outcome: Progressing Goal: Diagnostic test results will improve Outcome: Progressing Goal: Respiratory complications will improve Outcome: Progressing Goal: Cardiovascular complication will be avoided Outcome: Progressing

## 2023-09-25 NOTE — Progress Notes (Addendum)
 PROGRESS NOTE  Stephanie [name] FMW:[id number] DOB: [birth year] DOA: 09/24/2023 PCP: [facility]  Brief History:  88 year old female with a history of asthma/COPD, paroxysmal atrial fibrillation, CKD stage III, aortic stenosis status post TAVR 2022, diabetes mellitus type 2, hypertension, hyperlipidemia, lung adenocarcinoma status post SBRT 02/2022, GI bleed presenting with 2-day history of shortness of breath. The patient was recently mated to the hospital from 09/03/2023 to 09/08/2023 when she was treated for COPD exacerbation and right lower lobe pneumonia.  She was discharged home with a prednisone  taper, cefdinir , and azithromycin .  She was also given 3 days of furosemide  at the time of discharge and then to use it as needed.  The patient stated that she felt great after discharge.  However in the past 2 days she has had increasing shortness of breath.  She denies any worsening lower extremity edema but complains of some orthopnea.  She does have some chest discomfort initially, but has not had any chest discomfort today.  She denies any dizziness or syncope.  She has a nonproductive cough.  She denies any hemoptysis.  She has not had any fever, chills, nausea, vomiting, diarrhea, abdominal pain. The patient has been using her nebulizer at home without much improvement.  EMS was activated.  The patient was given Solu-Medrol  and bronchodilators on the way to the hospital.  Notably, the patient went to see pulmonology, Dr. Shelah on 09/21/2023.  He felt that the patient's most recent CT on 08/12/2023 right lower lobe mass was actually smaller.  Plans are for a PET scan, which if positive plan is for biopsy versus further therapy from prior biopsy.  In the ED, the patient was afebrile and hemodynamically stable with oxygen  saturation 86% on room air.  She was placed on 2 L with saturation 95%. WBC 11.0, hemoglobin 11.0, platelets 180.  Sodium 139, potassium 4.9,  bicarbonate 27, serum creatinine 1.89.  Chest x-ray showed hazy reticular opacities in the basilar area.  The patient was given furosemide  40 mg IV in the emergency department.   Assessment/Plan:  Acute respiratory failure with hypoxia and hypercarbia - Secondary to COPD exacerbation and fluid overload - Presented with tachypnea and saturation 86% on room air. - Stable on 2 L - Wean oxygen  as tolerated back to room air - CT chest--sm bilateral pleural effusions; bibasilar atelectasis; stable RLL mass - VBG 7.36/54/68/30   COPD exacerbation - Started Pulmicort  - Started Brovana  - Started DuoNebs - Continue IV Solu-Medrol  - Check COVID--neg - Check viral respiratory panel--neg   Acute HFpEF - 09/23/2022 echo EF 55 to 60%, no WMA, grade 1 DD, severe LVH, normal RVF, stable TAVR - hold IV furosemide  due to uptrend in serum creatinine - 09/25/23 Echo--EF 60-65%, mod LVH, normal TAVR fxn - Accurate I's and O's--incomplete   Acute on CKD stage IIIb - Baseline creatinine 1.5-1.7 - due to diuresis--hold further lasix    Elevated troponin -troponin 122>>136 -due to demand ischemia -no chest pain   Paroxysmal atrial fibrillation - She is not on anticoagulation secondary to history of GI bleed - Continue carvedilol    Essential hypertension - Continue carvedilol , hydralazine , Imdur  - Holding ARB in the setting of CKD and diuresis   Diabetes mellitus type 2, controlled - 04/07/2023 hemoglobin A1c 6.1 - Repeat hemoglobin A1c - NovoLog  sliding scale -Reduced dose Semglee   Obesity class I - BMI 33.75 - Lifestyle modification       Family  Communication:   daughter at bedside 9/7  Consultants:  none  Code Status:  FULL  DVT Prophylaxis:  Trilby Heparin    Procedures: As Listed in Progress Note Above  Antibiotics: None      Subjective: Pt feels breathing is better than yesterday.  Denies cp, n/v/d, abd pain, f/c  Objective: Vitals:   09/25/23 0846 09/25/23 1410  09/25/23 1437 09/25/23 1636  BP: (!) 161/51 (!) 151/72  (!) 167/89  Pulse: 62 82  75  Resp:      Temp:  97.7 F (36.5 C)    TempSrc:  Oral    SpO2:  94% 97% 94%  Weight:      Height:        Intake/Output Summary (Last 24 hours) at 09/25/2023 1738 Last data filed at 09/25/2023 0935 Gross per 24 hour  Intake 240 ml  Output --  Net 240 ml   Weight change: -0.056 kg Exam:  General:  Pt is alert, follows commands appropriately, not in acute distress HEENT: No icterus, No thrush, No neck mass, /AT Cardiovascular: RRR, S1/S2, no rubs, no gallops Respiratory:diminished BS.  Bibasilar crackles.  Minimal basilar wheeze Abdomen: Soft/+BS, non tender, non distended, no guarding Extremities: No edema, No lymphangitis, No petechiae, No rashes, no synovitis   Data Reviewed: I have personally reviewed following labs and imaging studies Basic Metabolic Panel: Recent Labs  Lab 09/24/23 0620 09/25/23 0443  NA 139 135  K 4.9 5.0  CL 103 99  CO2 27 25  GLUCOSE 70 204*  BUN 30* 43*  CREATININE 1.89* 2.06*  CALCIUM  8.1* 7.9*  MG  --  1.9   Liver Function Tests: No results for input(s): AST, ALT, ALKPHOS, BILITOT, PROT, ALBUMIN in the last 168 hours. No results for input(s): LIPASE, AMYLASE in the last 168 hours. No results for input(s): AMMONIA in the last 168 hours. Coagulation Profile: No results for input(s): INR, PROTIME in the last 168 hours. CBC: Recent Labs  Lab 09/24/23 0620  WBC 11.0*  HGB 11.0*  HCT 35.5*  MCV 92.0  PLT 180   Cardiac Enzymes: No results for input(s): CKTOTAL, CKMB, CKMBINDEX, TROPONINI in the last 168 hours. BNP: Invalid input(s): POCBNP CBG: No results for input(s): GLUCAP in the last 168 hours. HbA1C: No results for input(s): HGBA1C in the last 72 hours. Urine analysis:    Component Value Date/Time   COLORURINE YELLOW 04/07/2023 1504   APPEARANCEUR CLOUDY (A) 04/07/2023 1504   APPEARANCEUR Hazy (A)  05/23/2020 1119   LABSPEC 1.017 04/07/2023 1504   PHURINE 5.0 04/07/2023 1504   GLUCOSEU NEGATIVE 04/07/2023 1504   HGBUR NEGATIVE 04/07/2023 1504   BILIRUBINUR NEGATIVE 04/07/2023 1504   BILIRUBINUR Negative 05/23/2020 1119   KETONESUR NEGATIVE 04/07/2023 1504   PROTEINUR NEGATIVE 04/07/2023 1504   UROBILINOGEN 0.2 04/25/2014 0936   NITRITE NEGATIVE 04/07/2023 1504   LEUKOCYTESUR TRACE (A) 04/07/2023 1504   Sepsis Labs: @LABRCNTIP (procalcitonin:4,lacticidven:4) ) Recent Results (from the past 240 hours)  Respiratory (~20 pathogens) panel by PCR     Status: None   Collection Time: 09/24/23 12:58 PM   Specimen: Nasopharyngeal Swab; Respiratory  Result Value Ref Range Status   Adenovirus NOT DETECTED NOT DETECTED Final   Coronavirus 229E NOT DETECTED NOT DETECTED Final    Comment: (NOTE) The Coronavirus on the Respiratory Panel, DOES NOT test for the novel  Coronavirus (2019 nCoV)    Coronavirus HKU1 NOT DETECTED NOT DETECTED Final   Coronavirus NL63 NOT DETECTED NOT DETECTED Final   Coronavirus  OC43 NOT DETECTED NOT DETECTED Final   Metapneumovirus NOT DETECTED NOT DETECTED Final   Rhinovirus / Enterovirus NOT DETECTED NOT DETECTED Final   Influenza A NOT DETECTED NOT DETECTED Final   Influenza B NOT DETECTED NOT DETECTED Final   Parainfluenza Virus 1 NOT DETECTED NOT DETECTED Final   Parainfluenza Virus 2 NOT DETECTED NOT DETECTED Final   Parainfluenza Virus 3 NOT DETECTED NOT DETECTED Final   Parainfluenza Virus 4 NOT DETECTED NOT DETECTED Final   Respiratory Syncytial Virus NOT DETECTED NOT DETECTED Final   Bordetella pertussis NOT DETECTED NOT DETECTED Final   Bordetella Parapertussis NOT DETECTED NOT DETECTED Final   Chlamydophila pneumoniae NOT DETECTED NOT DETECTED Final   Mycoplasma pneumoniae NOT DETECTED NOT DETECTED Final    Comment: Performed at [facility], [address], [place], [place] [address]  Resp panel by RT-PCR (RSV, Flu A&B, Covid) Anterior  Nasal Swab     Status: None   Collection Time: 09/24/23 12:58 PM   Specimen: Anterior Nasal Swab  Result Value Ref Range Status   SARS Coronavirus 2 by RT PCR NEGATIVE NEGATIVE Final    Comment: (NOTE) SARS-CoV-2 target nucleic acids are NOT DETECTED.  The SARS-CoV-2 RNA is generally detectable in upper respiratory specimens during the acute phase of infection. The lowest concentration of SARS-CoV-2 viral copies this assay can detect is 138 copies/mL. A negative result does not preclude SARS-Cov-2 infection and should not be used as the sole basis for treatment or other patient management decisions. A negative result may occur with  improper specimen collection/handling, submission of specimen other than nasopharyngeal swab, presence of viral mutation(s) within the areas targeted by this assay, and inadequate number of viral copies(<138 copies/mL). A negative result must be combined with clinical observations, patient history, and epidemiological information. The expected result is Negative.  Fact Sheet for Patients:  BloggerCourse.com  Fact Sheet for Healthcare Providers:  SeriousBroker.it  This test is no t yet approved or cleared by the [place]  FDA and  has been authorized for detection and/or diagnosis of SARS-CoV-2 by FDA under an Emergency Use Authorization (EUA). This EUA will remain  in effect (meaning this test can be used) for the duration of the COVID-19 declaration under Section 564(b)(1) of the Act, 21 U.S.C.section 360bbb-3(b)(1), unless the authorization is terminated  or revoked sooner.       Influenza A by PCR NEGATIVE NEGATIVE Final   Influenza B by PCR NEGATIVE NEGATIVE Final    Comment: (NOTE) The Xpert Xpress SARS-CoV-2/FLU/RSV plus assay is intended as an aid in the diagnosis of influenza from Nasopharyngeal swab specimens and should not be used as a sole basis for treatment. Nasal washings  and aspirates are unacceptable for Xpert Xpress SARS-CoV-2/FLU/RSV testing.  Fact Sheet for Patients: BloggerCourse.com  Fact Sheet for Healthcare Providers: SeriousBroker.it  This test is not yet approved or cleared by the [place]  FDA and has been authorized for detection and/or diagnosis of SARS-CoV-2 by FDA under an Emergency Use Authorization (EUA). This EUA will remain in effect (meaning this test can be used) for the duration of the COVID-19 declaration under Section 564(b)(1) of the Act, 21 U.S.C. section 360bbb-3(b)(1), unless the authorization is terminated or revoked.     Resp Syncytial Virus by PCR NEGATIVE NEGATIVE Final    Comment: (NOTE) Fact Sheet for Patients: BloggerCourse.com  Fact Sheet for Healthcare Providers: SeriousBroker.it  This test is not yet approved or cleared by the [place]  FDA and has been authorized  for detection and/or diagnosis of SARS-CoV-2 by FDA under an Emergency Use Authorization (EUA). This EUA will remain in effect (meaning this test can be used) for the duration of the COVID-19 declaration under Section 564(b)(1) of the Act, 21 U.S.C. section 360bbb-3(b)(1), unless the authorization is terminated or revoked.  Performed at [facility], [address], [place], [place] [address]   MRSA Next Gen by PCR, Nasal     Status: None   Collection Time: 09/24/23  1:33 PM   Specimen: Anterior Nasal Swab  Result Value Ref Range Status   MRSA by PCR Next Gen NOT DETECTED NOT DETECTED Final    Comment: (NOTE) The GeneXpert MRSA Assay (FDA approved for NASAL specimens only), is one component of a comprehensive MRSA colonization surveillance program. It is not intended to diagnose MRSA infection nor to guide or monitor treatment for MRSA infections. Test performance is not FDA approved in patients less than 6 years old. Performed at  [facility], [address], [place], [place] [address]      Scheduled Meds:  arformoterol   15 mcg Nebulization BID   budesonide  (PULMICORT ) nebulizer solution  0.5 mg Nebulization BID   carvedilol   6.25 mg Oral BID WC   cinacalcet   30 mg Oral Q breakfast   ferrous sulfate   325 mg Oral Q lunch   gabapentin   100 mg Oral QHS   heparin   5,000 Units Subcutaneous Q8H   hydrALAZINE   50 mg Oral TID   ipratropium-albuterol   3 mL Nebulization Q6H   isosorbide  mononitrate  30 mg Oral Daily   loratadine   10 mg Oral Daily   methylPREDNISolone  (SOLU-MEDROL ) injection  60 mg Intravenous Q12H   sodium chloride  flush  3 mL Intravenous Q12H   Continuous Infusions:  Procedures/Studies: ECHOCARDIOGRAM COMPLETE Result Date: 09/25/2023    ECHOCARDIOGRAM REPORT   Patient Name:   MALAYAH DEMURO Date of Exam: 09/25/2023 Medical Rec #:  [id number]         Height:       65.0 in Accession #:    [id number]        Weight:       198.2 lb Date of Birth:  1935-05-07         BSA:          1.971 m Patient Age:    87 years          BP:           161/51 mmHg Patient Gender: F                 HR:           64 bpm. Exam Location:  [facility] Procedure: 2D Echo, Cardiac Doppler and Color Doppler (Both Spectral and Color            Flow Doppler were utilized during procedure). Indications:    CHF- Acute Diastolic I50.31  History:        Patient has prior history of Echocardiogram examinations, most                 recent 09/23/2022. HFpEF, COPD and CKD, Aortic Valve Disease and                 s/p 26mm Edward TAVR valve present in the aortic postion.                 Procedure date: 07/01/2020; Risk Factors:Hypertension, Diabetes  and Dyslipidemia.                 Aortic Valve: 26 mm Sapien prosthetic, stented (TAVR) valve is                 present in the aortic position.  Sonographer:    Koleen Popper RDCS Referring Phys: [phone] Malaijah Houchen IMPRESSIONS  1. Left ventricular ejection fraction, by estimation, is 60 to 65%. The  left ventricle has normal function. Left ventricular endocardial border not optimally defined to evaluate regional wall motion. There is moderate concentric left ventricular hypertrophy. Left ventricular diastolic parameters are indeterminate.  2. Right ventricular systolic function is normal. The right ventricular size is mildly enlarged. Tricuspid regurgitation signal is inadequate for assessing PA pressure.  3. Right atrial size was moderately dilated.  4. The mitral valve is abnormal. No evidence of mitral valve regurgitation. Moderate mitral stenosis. The mean mitral valve gradient is 6.0 mmHg. Severe mitral annular calcification.  5. The aortic valve has been repaired/replaced. Aortic valve regurgitation is not visualized. No aortic stenosis is present. There is a 26 mm Sapien prosthetic (TAVR) valve present in the aortic position. Echo findings are consistent with normal structure and function of the aortic valve prosthesis. Aortic valve mean gradient measures 11.0 mmHg.  6. The inferior vena cava is dilated in size with >50% respiratory variability, suggesting right atrial pressure of 8 mmHg.  7. Increased flow velocities may be secondary to anemia, thyrotoxicosis, hyperdynamic or high flow state. FINDINGS  Left Ventricle: Left ventricular ejection fraction, by estimation, is 60 to 65%. The left ventricle has normal function. Left ventricular endocardial border not optimally defined to evaluate regional wall motion. Strain was performed and the global longitudinal strain is indeterminate. The left ventricular internal cavity size was normal in size. There is moderate concentric left ventricular hypertrophy. Left ventricular diastolic function could not be evaluated due to mitral annular calcification (moderate or greater). Left ventricular diastolic parameters are indeterminate. Right Ventricle: The right ventricular size is mildly enlarged. No increase in right ventricular wall thickness. Right ventricular  systolic function is normal. Tricuspid regurgitation signal is inadequate for assessing PA pressure. Left Atrium: Left atrial size was normal in size. Right Atrium: Right atrial size was moderately dilated. Pericardium: There is no evidence of pericardial effusion. Mitral Valve: The mitral valve is abnormal. Severe mitral annular calcification. No evidence of mitral valve regurgitation. Moderate mitral valve stenosis. MV peak gradient, 14.1 mmHg. The mean mitral valve gradient is 6.0 mmHg. Tricuspid Valve: The tricuspid valve is normal in structure. Tricuspid valve regurgitation is not demonstrated. No evidence of tricuspid stenosis. Aortic Valve: The aortic valve has been repaired/replaced. Aortic valve regurgitation is not visualized. No aortic stenosis is present. Aortic valve mean gradient measures 11.0 mmHg. Aortic valve peak gradient measures 20.8 mmHg. Aortic valve area, by VTI measures 1.67 cm. There is a 26 mm Sapien prosthetic, stented (TAVR) valve present in the aortic position. Echo findings are consistent with normal structure and function of the aortic valve prosthesis. Pulmonic Valve: The pulmonic valve was not well visualized. Pulmonic valve regurgitation is not visualized. No evidence of pulmonic stenosis. Aorta: The aortic root and ascending aorta are structurally normal, with no evidence of dilitation. Venous: The inferior vena cava is dilated in size with greater than 50% respiratory variability, suggesting right atrial pressure of 8 mmHg. IAS/Shunts: No atrial level shunt detected by color flow Doppler. Additional Comments: 3D was performed not requiring image post processing on an independent  workstation and was indeterminate.  LEFT VENTRICLE PLAX 2D LVIDd:         4.50 cm LVIDs:         2.70 cm LV PW:         1.40 cm LV IVS:        1.60 cm LVOT diam:     1.70 cm LV SV:         79 LV SV Index:   40 LVOT Area:     2.27 cm  RIGHT VENTRICLE             IVC RV S prime:     10.90 cm/s  IVC diam:  2.60 cm TAPSE (M-mode): 2.8 cm LEFT ATRIUM           Index        RIGHT ATRIUM           Index LA diam:      5.30 cm 2.69 cm/m   RA Area:     23.40 cm LA Vol (A4C): 45.4 ml 23.04 ml/m  RA Volume:   72.30 ml  36.69 ml/m  AORTIC VALVE AV Area (Vmax):    1.57 cm AV Area (Vmean):   1.70 cm AV Area (VTI):     1.67 cm AV Vmax:           228.00 cm/s AV Vmean:          148.000 cm/s AV VTI:            0.475 m AV Peak Grad:      20.8 mmHg AV Mean Grad:      11.0 mmHg LVOT Vmax:         158.00 cm/s LVOT Vmean:        111.000 cm/s LVOT VTI:          0.349 m LVOT/AV VTI ratio: 0.73  AORTA Ao Root diam: 2.50 cm Ao Asc diam:  3.40 cm MITRAL VALVE MV Area (PHT): 3.53 cm     SHUNTS MV Area VTI:   1.23 cm     Systemic VTI:  0.35 m MV Peak grad:  14.1 mmHg    Systemic Diam: 1.70 cm MV Mean grad:  6.0 mmHg MV Vmax:       1.88 m/s MV Vmean:      112.0 cm/s MV Decel Time: 215 msec MV E velocity: 166.00 cm/s MV A velocity: 139.00 cm/s MV E/A ratio:  1.19 Vishnu Priya Mallipeddi Electronically signed by Diannah Late Mallipeddi Signature Date/Time: 09/25/2023/2:36:34 PM    Final    CT CHEST WO CONTRAST Result Date: 09/24/2023 CLINICAL DATA:  Respiratory illness, nondiagnostic x-ray. Acute respiratory failure with hypoxia secondary to COPD exacerbation in fluid overload. Tachypnea with low O2 sat. EXAM: CT CHEST WITHOUT CONTRAST TECHNIQUE: Multidetector CT imaging of the chest was performed following the standard protocol without IV contrast. RADIATION DOSE REDUCTION: This exam was performed according to the departmental dose-optimization program which includes automated exposure control, adjustment of the mA and/or kV according to patient size and/or use of iterative reconstruction technique. COMPARISON:  08/12/2023. FINDINGS: Cardiovascular: The heart is enlarged and there is a small pericardial effusion. Multi-vessel coronary artery calcifications are noted. A TAVR stent is seen. There is atherosclerotic calcification of the  aorta without evidence of aneurysm. Evaluation of the pulmonary arteries is limited due to respiratory motion. No large central pulmonary artery filling defect is seen. Mediastinum/Nodes: No mediastinal, hilar, or axillary lymphadenopathy is seen. The trachea and esophagus are  within normal limits. There is a small hiatal hernia. Lungs/Pleura: Small pleural effusions are noted bilaterally. Pleural and parenchymal scarring is present bilaterally. Atelectasis is present bilaterally. No pneumothorax is seen. There is a stable right lower lobe mass containing a fiducial marker. Upper Abdomen: Cysts are present in the liver. There is a cyst in the right kidney. A left adrenal nodule is noted, previously characterized as adenoma. No acute abnormality. Musculoskeletal: Degenerative changes are present in the thoracic spine. There stable bony deformity of the ribs on the right. No acute osseous abnormality is seen. IMPRESSION: 1. No definite evidence of pulmonary embolism. Examination is limited due to respiratory motion artifact. 2. Small bilateral pleural effusions with atelectasis. 3. Stable right lower lobe mass with fiducial marker. 4. Cardiomegaly with coronary artery calcifications. 5. Aortic atherosclerosis. Electronically Signed   By: Leita [place] M.D.   On: 09/24/2023 17:12   DG Chest Portable 1 View Result Date: 09/24/2023 EXAM: 1 VIEW XRAY OF THE CHEST 09/24/2023 06:40:47 AM COMPARISON: PA and lateral radiographs of the chest dated 09/03/2023. CLINICAL HISTORY: Shortness of breath. Patient called EMS for difficulty breathing, administered Duonebs and Solumedrol. FINDINGS: LUNGS AND PLEURA: Hazy and reticular opacities present within the lung bases bilaterally. No pleural effusion. No pneumothorax. HEART AND MEDIASTINUM: The heart is enlarged. Status post aortic valve repair. Moderate calcification within the aortic arch. BONES AND SOFT TISSUES: No acute osseous abnormality. IMPRESSION: 1. Hazy and reticular  opacities within the lung bases bilaterally. 2. Enlarged heart and status post aortic valve repair with moderate calcification within the aortic arch. Electronically signed by: Evalene Coho MD 09/24/2023 06:45 AM EDT RP Workstation: [id number]   DG Chest 2 View Result Date: 09/03/2023 EXAM: 2 VIEW(S) XRAY OF THE CHEST 09/03/2023 10:58:00 AM COMPARISON: 08/12/2023 CLINICAL HISTORY: SOB. Per chart: Pt via EMS from home c/o SOB with wheezing and accessory muscle use. Hx COPD. Albuterol  given en route. O2 90% RA on scene, increased to 96% after albuterol . Lungs clear bilaterally. FINDINGS: LUNGS AND PLEURA: Interval increase opacification within the right lung base, which may represent atelectasis or airspace disease. Small bilateral pleural effusions identified. HEART AND MEDIASTINUM: Cardiac enlargement. Status post TAVR. Aortic atherosclerotic calcification. BONES AND SOFT TISSUES: No acute osseous abnormality. IMPRESSION: 1. Interval increase opacification within the right lung base, possibly representing atelectasis or airspace disease. 2. Small bilateral pleural effusions. 3. Cardiac enlargement, status post TAVR, and aortic atherosclerotic calcification. Electronically signed by: [place] Calk MD 09/03/2023 11:19 AM EDT RP Workstation: [email or web address]    Alm Schneider, DO  [facility]  If 7PM-7AM, please contact night-coverage [email or web address] Password [email or web address] 09/25/2023, 5:38 PM   LOS: 1 day

## 2023-09-25 NOTE — Progress Notes (Signed)
  Echocardiogram 2D Echocardiogram has been performed.  Koleen KANDICE Popper, RDCS 09/25/2023, 2:07 PM

## 2023-09-26 DIAGNOSIS — N1832 Chronic kidney disease, stage 3b: Secondary | ICD-10-CM | POA: Diagnosis not present

## 2023-09-26 DIAGNOSIS — J9601 Acute respiratory failure with hypoxia: Secondary | ICD-10-CM | POA: Diagnosis not present

## 2023-09-26 DIAGNOSIS — J441 Chronic obstructive pulmonary disease with (acute) exacerbation: Secondary | ICD-10-CM | POA: Diagnosis not present

## 2023-09-26 DIAGNOSIS — Z952 Presence of prosthetic heart valve: Secondary | ICD-10-CM | POA: Diagnosis not present

## 2023-09-26 LAB — BASIC METABOLIC PANEL WITH GFR
Anion gap: 8 (ref 5–15)
BUN: 48 mg/dL — ABNORMAL HIGH (ref 8–23)
CO2: 27 mmol/L (ref 22–32)
Calcium: 8 mg/dL — ABNORMAL LOW (ref 8.9–10.3)
Chloride: 101 mmol/L (ref 98–111)
Creatinine, Ser: 1.88 mg/dL — ABNORMAL HIGH (ref 0.44–1.00)
GFR, Estimated: 26 mL/min — ABNORMAL LOW (ref >60)
Glucose, Bld: 201 mg/dL — ABNORMAL HIGH (ref 70–99)
Potassium: 5.4 mmol/L — ABNORMAL HIGH (ref 3.5–5.1)
Sodium: 136 mmol/L (ref 135–145)

## 2023-09-26 LAB — HEMOGLOBIN A1C
Hgb A1c MFr Bld: 6.5 % — ABNORMAL HIGH (ref 4.8–5.6)
Mean Plasma Glucose: 139.85 mg/dL

## 2023-09-26 LAB — MAGNESIUM: Magnesium: 2 mg/dL (ref 1.7–2.4)

## 2023-09-26 MED ORDER — PREDNISONE 20 MG PO TABS: 60.0000 mg | ORAL_TABLET | Freq: Every day | ORAL | Status: DC

## 2023-09-26 MED ORDER — AMLODIPINE BESYLATE 5 MG PO TABS: 2.5000 mg | ORAL_TABLET | Freq: Every day | ORAL | Status: DC

## 2023-09-26 MED ORDER — PREDNISONE 10 MG PO TABS: 60.0000 mg | ORAL_TABLET | Freq: Every day | ORAL | 0 refills | Status: DC

## 2023-09-26 MED ORDER — AMLODIPINE BESYLATE 2.5 MG PO TABS
2.5000 mg | ORAL_TABLET | Freq: Every day | ORAL | 1 refills | Status: AC
Start: 2023-09-26 — End: ?

## 2023-09-26 NOTE — Progress Notes (Signed)
 Mobility Specialist Progress Note:    09/26/23 0850  Mobility  Activity Ambulated with assistance  Level of Assistance Standby assist, set-up cues, supervision of patient - no hands on  Assistive Device Front wheel walker  Distance Ambulated (ft) 160 ft  Range of Motion/Exercises Active;All extremities  Activity Response Tolerated well  Mobility Referral Yes  Mobility visit 1 Mobility  Mobility Specialist Start Time (ACUTE ONLY) 0850  Mobility Specialist Stop Time (ACUTE ONLY) 0910  Mobility Specialist Time Calculation (min) (ACUTE ONLY) 20 min   Pt received in bed, agreeable to mobility.  Required supervision to stand and ambulate with RW. Tolerated well,seated BP 141/57. SpO2 96% on RA at rest, SpO2 90-92% on RA during ambulation. Left pt sitting EOB, alarm on. All needs met.  Raynette Arras Mobility Specialist Please contact via Special educational needs teacher or  Rehab office at [phone]

## 2023-09-26 NOTE — TOC Transition Note (Signed)
 Transition of Care [facility]) - Discharge Note   Patient Details  Name: Stephanie [name] MRN: [id number] Date of Birth: October 31, 1935  Transition of Care [facility]) CM/SW Contact:  Hoy DELENA Bigness, LCSW Phone Number: 09/26/2023, 11:54 AM   Clinical Narrative:    Pt to return home with daughter/grandaughter at discharge. TOC consulted for CHF HH screening. Pt reports she receives pill packs from the pharmacy and is consistent with taking her medications. Pt does not monitor her weight as she does not have a scale at home. Scale provided to pt to take home with her at discharge. Pt uses a push cart for ambulation. Pt's daughter provides transportation for pt. No further TOC needs identified at this time.    Final next level of care: Home/Self Care Barriers to Discharge: Continued Medical Work up, No Barriers Identified   Patient Goals and CMS Choice Patient states their goals for this hospitalization and ongoing recovery are:: To be able to breathe better CMS Medicare.gov Compare Post Acute Care list provided to:: Patient Choice offered to / list presented to : Patient [place] ownership interest in [facility].provided to::  (NA)    Discharge Placement                       Discharge Plan and Services Additional resources added to the After Visit Summary for   In-house Referral: Clinical Social Work Discharge Planning Services: NA Post Acute Care Choice: NA          DME Arranged: N/A DME Agency: NA                  Social Drivers of Health (SDOH) Interventions SDOH Screenings   Food Insecurity: No Food Insecurity (09/24/2023)  Housing: Low Risk  (09/24/2023)  Transportation Needs: No Transportation Needs (09/24/2023)  Utilities: Not At Risk (09/24/2023)  Depression (PHQ2-9): Low Risk  (03/12/2022)  Social Connections: Moderately Isolated (09/24/2023)  Tobacco Use: Medium Risk (09/24/2023)     Readmission Risk Interventions    09/26/2023   11:50 AM 09/04/2023    7:28  PM  Readmission Risk Prevention Plan  Transportation Screening Complete Complete  PCP or Specialist Appt within 3-5 Days  Complete  HRI or Home Care Consult  Complete  Social Work Consult for Recovery Care Planning/Counseling  Complete  Palliative Care Screening  Not Applicable  Medication Review [employer]) Complete Complete  PCP or Specialist appointment within 3-5 days of discharge Complete   HRI or Home Care Consult Complete   SW Recovery Care/Counseling Consult Complete   Palliative Care Screening Not Applicable   Skilled Nursing Facility Complete

## 2023-09-26 NOTE — Progress Notes (Signed)
 Heart Failure Navigator Progress Note  Attempted to call patient in he room today at [facility].  The room phone in 320 was busy.  No cell phone listed on patient contact list at this time. AHF Clinic TOC scheduled for 10/03/2023 @ 11:00 AM @ [facility].  Navigator will continue to try to call patient to provide Heart Failure Education and [facility] appointment information.  Charmaine Pines, RN, BSN [facility] Heart Failure Navigator Secure Chat Only

## 2023-09-26 NOTE — Progress Notes (Signed)
Patient discharged home with instructions given on medications and follow up visits,patient verbalized understanding. Prescriptions sent to Pharmacy of choice documented on AVS. IV discontinued, catheter intact. Accompanied by staff to an awaiting vehicle.

## 2023-09-26 NOTE — Discharge Summary (Signed)
 Physician Discharge Summary   Patient: Stephanie [name] MRN: [id number] DOB: [birth year]  Admit date:     09/24/2023  Discharge date: 09/26/23  Discharge Physician: Alm Shamel Galyean   PCP: [facility]   Recommendations at discharge:   Please follow up with primary care provider within 1-2 weeks  Please repeat BMP and CBC in one week    Hospital Course: 88 year old female with a history of asthma/COPD, paroxysmal atrial fibrillation, CKD stage III, aortic stenosis status post TAVR 2022, diabetes mellitus type 2, hypertension, hyperlipidemia, lung adenocarcinoma status post SBRT 02/2022, GI bleed presenting with 2-day history of shortness of breath. The patient was recently mated to the hospital from 09/03/2023 to 09/08/2023 when she was treated for COPD exacerbation and right lower lobe pneumonia.  She was discharged home with a prednisone  taper, cefdinir , and azithromycin .  She was also given 3 days of furosemide  at the time of discharge and then to use it as needed.  The patient stated that she felt great after discharge.  However in the past 2 days she has had increasing shortness of breath.  She denies any worsening lower extremity edema but complains of some orthopnea.  She does have some chest discomfort initially, but has not had any chest discomfort today.  She denies any dizziness or syncope.  She has a nonproductive cough.  She denies any hemoptysis.  She has not had any fever, chills, nausea, vomiting, diarrhea, abdominal pain. The patient has been using her nebulizer at home without much improvement.  EMS was activated.  The patient was given Solu-Medrol  and bronchodilators on the way to the hospital.  Notably, the patient went to see pulmonology, Dr. Shelah on 09/21/2023.  He felt that the patient's most recent CT on 08/12/2023 right lower lobe mass was actually smaller.  Plans are for a PET scan, which if positive plan is for biopsy versus further therapy from prior  biopsy.  In the ED, the patient was afebrile and hemodynamically stable with oxygen  saturation 86% on room air.  She was placed on 2 L with saturation 95%. WBC 11.0, hemoglobin 11.0, platelets 180.  Sodium 139, potassium 4.9, bicarbonate 27, serum creatinine 1.89.  Chest x-ray showed hazy reticular opacities in the basilar area.  The patient was given furosemide  40 mg IV in the emergency department.  Assessment and Plan: Acute respiratory failure with hypoxia and hypercarbia - Secondary to COPD exacerbation and fluid overload - Presented with tachypnea and saturation 86% on room air. - Stable on 2 L - Wean oxygen  as tolerated back to room air - CT chest--sm bilateral pleural effusions; bibasilar atelectasis; stable RLL mass - VBG 7.36/54/68/30 - ambulatory pulseox on day of d/c did not show desaturation <90%   COPD exacerbation - Started Pulmicort  - Started Brovana  - Started DuoNebs - Continue IV Solu-Medrol  - Check COVID--neg - Check viral respiratory panel--neg -d/c home with prednisone  taper -continue home prior home albuterol  nebs PTA   Acute HFpEF - 09/23/2022 echo EF 55 to 60%, no WMA, grade 1 DD, severe LVH, normal RVF, stable TAVR - hold IV furosemide  due to uptrend in serum creatinine - 09/25/23 Echo--EF 60-65%, mod LVH, normal TAVR fxn - Accurate I's and O's--incomplete - d/c home with prn lasix    Acute on CKD stage IIIb - Baseline creatinine 1.5-1.7 - due to diuresis--hold further lasix  - uptrend in creatinine up to 2.06 -serum improved to 1.88 on day of dc   Elevated troponin -troponin 122>>136 -due to demand  ischemia -no chest pain   Paroxysmal atrial fibrillation - She is not on anticoagulation secondary to history of GI bleed - Continue carvedilol    Essential hypertension - Continue carvedilol , hydralazine , Imdur  - Holding ARB in the setting of CKD and diuresis - add low dose amlodipine    Diabetes mellitus type 2, controlled - 04/07/2023 hemoglobin A1c  6.1 -09/26/23 hemoglobin A1c 6.5 - NovoLog  sliding scale -Reduced dose Semglee while hospitalized   Obesity class I - BMI 33.75 - Lifestyle modification      Consultants: none Procedures performed: none  Disposition: Home Diet recommendation:  Cardiac and Carb modified diet DISCHARGE MEDICATION: Allergies as of 09/26/2023       Reactions   Amoxicillin Swelling   Tongue swelling   Nitrofurantoin Nausea And Vomiting   Penicillins Swelling   Sulfa Antibiotics Swelling   Lip swelling        Medication List     STOP taking these medications    lisinopril  2.5 MG tablet Commonly known as: ZESTRIL    valsartan  80 MG tablet Commonly known as: Diovan        TAKE these medications    albuterol  (2.5 MG/3ML) 0.083% nebulizer solution Commonly known as: PROVENTIL  Take 3 mLs (2.5 mg total) by nebulization every 2 (two) hours as needed for wheezing or shortness of breath.   amLODipine  2.5 MG tablet Commonly known as: NORVASC  Take 1 tablet (2.5 mg total) by mouth daily.   carvedilol  6.25 MG tablet Commonly known as: COREG  Take 6.25 mg by mouth 2 (two) times daily with a meal.   cetirizine 10 MG tablet Commonly known as: ZYRTEC Take 10 mg by mouth at bedtime.   cinacalcet  30 MG tablet Commonly known as: SENSIPAR  Take 30 mg by mouth daily.   FeroSul 325 (65 Fe) MG tablet Generic drug: ferrous sulfate  Take 325 mg by mouth daily.   furosemide  20 MG tablet Commonly known as: LASIX  Take 1 tablet (20 mg total) by mouth daily for 3 days, THEN 1 tablet (20 mg total) daily as needed (leg edema, shortness of breath, or weight gain.). Start taking on: August 30, 2023 What changed: See the new instructions.   gabapentin  100 MG capsule Commonly known as: NEURONTIN  Take 100-200 mg by mouth at bedtime.   GNP Vitamin D3 Extra Strength 25 MCG (1000 UT) tablet Generic drug: Cholecalciferol Take 1,000 Units by mouth daily.   hydrALAZINE  50 MG tablet Commonly known as:  APRESOLINE  Take 1 tablet (50 mg total) by mouth 3 (three) times daily.   isosorbide  mononitrate 30 MG 24 hr tablet Commonly known as: IMDUR  Take 1 tablet (30 mg total) by mouth daily.   Lantus SoloStar 100 UNIT/ML Solostar Pen Generic drug: insulin  glargine Inject 40 Units into the skin at bedtime.   predniSONE  10 MG tablet Commonly known as: DELTASONE  Take 6 tablets (60 mg total) by mouth daily with breakfast. And decrease by 1 tablet daily Start taking on: September 27, 2023   Stiolto Respimat  2.5-2.5 MCG/ACT Aers Generic drug: Tiotropium Bromide-Olodaterol Inhale 2 puffs into the lungs daily.        Discharge Exam: Filed Weights   09/24/23 1232 09/25/23 0315 09/26/23 0324  Weight: 91.9 kg 89.9 kg 91.6 kg   HEENT:  [place]/AT, No thrush, no icterus CV:  RRR, no rub, no S3, no S4 Lung:  bibasilar rales.  No wheeze Abd:  soft/+BS, NT Ext:  trace LE edema, no lymphangitis, no synovitis, no rash   Condition at discharge: stable  The results of  significant diagnostics from this hospitalization (including imaging, microbiology, ancillary and laboratory) are listed below for reference.   Imaging Studies: ECHOCARDIOGRAM COMPLETE Result Date: 09/25/2023    ECHOCARDIOGRAM REPORT   Patient Name:   CAROLLYN ETCHEVERRY Date of Exam: 09/25/2023 Medical Rec #:  [id number]         Height:       65.0 in Accession #:    [id number]        Weight:       198.2 lb Date of Birth:  04/02/1935         BSA:          1.971 m Patient Age:    87 years          BP:           161/51 mmHg Patient Gender: F                 HR:           64 bpm. Exam Location:  [facility] Procedure: 2D Echo, Cardiac Doppler and Color Doppler (Both Spectral and Color            Flow Doppler were utilized during procedure). Indications:    CHF- Acute Diastolic I50.31  History:        Patient has prior history of Echocardiogram examinations, most                 recent 09/23/2022. HFpEF, COPD and CKD, Aortic Valve Disease and                  s/p 26mm Edward TAVR valve present in the aortic postion.                 Procedure date: 07/01/2020; Risk Factors:Hypertension, Diabetes                 and Dyslipidemia.                 Aortic Valve: 26 mm Sapien prosthetic, stented (TAVR) valve is                 present in the aortic position.  Sonographer:    Koleen Popper RDCS Referring Phys: [id number] Kenedee Molesky IMPRESSIONS  1. Left ventricular ejection fraction, by estimation, is 60 to 65%. The left ventricle has normal function. Left ventricular endocardial border not optimally defined to evaluate regional wall motion. There is moderate concentric left ventricular hypertrophy. Left ventricular diastolic parameters are indeterminate.  2. Right ventricular systolic function is normal. The right ventricular size is mildly enlarged. Tricuspid regurgitation signal is inadequate for assessing PA pressure.  3. Right atrial size was moderately dilated.  4. The mitral valve is abnormal. No evidence of mitral valve regurgitation. Moderate mitral stenosis. The mean mitral valve gradient is 6.0 mmHg. Severe mitral annular calcification.  5. The aortic valve has been repaired/replaced. Aortic valve regurgitation is not visualized. No aortic stenosis is present. There is a 26 mm Sapien prosthetic (TAVR) valve present in the aortic position. Echo findings are consistent with normal structure and function of the aortic valve prosthesis. Aortic valve mean gradient measures 11.0 mmHg.  6. The inferior vena cava is dilated in size with >50% respiratory variability, suggesting right atrial pressure of 8 mmHg.  7. Increased flow velocities may be secondary to anemia, thyrotoxicosis, hyperdynamic or high flow state. FINDINGS  Left Ventricle: Left ventricular ejection fraction, by estimation, is 60 to 65%. The left ventricle has normal function.  Left ventricular endocardial border not optimally defined to evaluate regional wall motion. Strain was performed and the global longitudinal  strain is indeterminate. The left ventricular internal cavity size was normal in size. There is moderate concentric left ventricular hypertrophy. Left ventricular diastolic function could not be evaluated due to mitral annular calcification (moderate or greater). Left ventricular diastolic parameters are indeterminate. Right Ventricle: The right ventricular size is mildly enlarged. No increase in right ventricular wall thickness. Right ventricular systolic function is normal. Tricuspid regurgitation signal is inadequate for assessing PA pressure. Left Atrium: Left atrial size was normal in size. Right Atrium: Right atrial size was moderately dilated. Pericardium: There is no evidence of pericardial effusion. Mitral Valve: The mitral valve is abnormal. Severe mitral annular calcification. No evidence of mitral valve regurgitation. Moderate mitral valve stenosis. MV peak gradient, 14.1 mmHg. The mean mitral valve gradient is 6.0 mmHg. Tricuspid Valve: The tricuspid valve is normal in structure. Tricuspid valve regurgitation is not demonstrated. No evidence of tricuspid stenosis. Aortic Valve: The aortic valve has been repaired/replaced. Aortic valve regurgitation is not visualized. No aortic stenosis is present. Aortic valve mean gradient measures 11.0 mmHg. Aortic valve peak gradient measures 20.8 mmHg. Aortic valve area, by VTI measures 1.67 cm. There is a 26 mm Sapien prosthetic, stented (TAVR) valve present in the aortic position. Echo findings are consistent with normal structure and function of the aortic valve prosthesis. Pulmonic Valve: The pulmonic valve was not well visualized. Pulmonic valve regurgitation is not visualized. No evidence of pulmonic stenosis. Aorta: The aortic root and ascending aorta are structurally normal, with no evidence of dilitation. Venous: The inferior vena cava is dilated in size with greater than 50% respiratory variability, suggesting right atrial pressure of 8 mmHg. IAS/Shunts:  No atrial level shunt detected by color flow Doppler. Additional Comments: 3D was performed not requiring image post processing on an independent workstation and was indeterminate.  LEFT VENTRICLE PLAX 2D LVIDd:         4.50 cm LVIDs:         2.70 cm LV PW:         1.40 cm LV IVS:        1.60 cm LVOT diam:     1.70 cm LV SV:         79 LV SV Index:   40 LVOT Area:     2.27 cm  RIGHT VENTRICLE             IVC RV S prime:     10.90 cm/s  IVC diam: 2.60 cm TAPSE (M-mode): 2.8 cm LEFT ATRIUM           Index        RIGHT ATRIUM           Index LA diam:      5.30 cm 2.69 cm/m   RA Area:     23.40 cm LA Vol (A4C): 45.4 ml 23.04 ml/m  RA Volume:   72.30 ml  36.69 ml/m  AORTIC VALVE AV Area (Vmax):    1.57 cm AV Area (Vmean):   1.70 cm AV Area (VTI):     1.67 cm AV Vmax:           228.00 cm/s AV Vmean:          148.000 cm/s AV VTI:            0.475 m AV Peak Grad:      20.8 mmHg AV Mean Grad:  11.0 mmHg LVOT Vmax:         158.00 cm/s LVOT Vmean:        111.000 cm/s LVOT VTI:          0.349 m LVOT/AV VTI ratio: 0.73  AORTA Ao Root diam: 2.50 cm Ao Asc diam:  3.40 cm MITRAL VALVE MV Area (PHT): 3.53 cm     SHUNTS MV Area VTI:   1.23 cm     Systemic VTI:  0.35 m MV Peak grad:  14.1 mmHg    Systemic Diam: 1.70 cm MV Mean grad:  6.0 mmHg MV Vmax:       1.88 m/s MV Vmean:      112.0 cm/s MV Decel Time: 215 msec MV E velocity: 166.00 cm/s MV A velocity: 139.00 cm/s MV E/A ratio:  1.19 Vishnu Priya Mallipeddi Electronically signed by Diannah Late Mallipeddi Signature Date/Time: 09/25/2023/2:36:34 PM    Final    CT CHEST WO CONTRAST Result Date: 09/24/2023 CLINICAL DATA:  Respiratory illness, nondiagnostic x-ray. Acute respiratory failure with hypoxia secondary to COPD exacerbation in fluid overload. Tachypnea with low O2 sat. EXAM: CT CHEST WITHOUT CONTRAST TECHNIQUE: Multidetector CT imaging of the chest was performed following the standard protocol without IV contrast. RADIATION DOSE REDUCTION: This exam was  performed according to the departmental dose-optimization program which includes automated exposure control, adjustment of the mA and/or kV according to patient size and/or use of iterative reconstruction technique. COMPARISON:  08/12/2023. FINDINGS: Cardiovascular: The heart is enlarged and there is a small pericardial effusion. Multi-vessel coronary artery calcifications are noted. A TAVR stent is seen. There is atherosclerotic calcification of the aorta without evidence of aneurysm. Evaluation of the pulmonary arteries is limited due to respiratory motion. No large central pulmonary artery filling defect is seen. Mediastinum/Nodes: No mediastinal, hilar, or axillary lymphadenopathy is seen. The trachea and esophagus are within normal limits. There is a small hiatal hernia. Lungs/Pleura: Small pleural effusions are noted bilaterally. Pleural and parenchymal scarring is present bilaterally. Atelectasis is present bilaterally. No pneumothorax is seen. There is a stable right lower lobe mass containing a fiducial marker. Upper Abdomen: Cysts are present in the liver. There is a cyst in the right kidney. A left adrenal nodule is noted, previously characterized as adenoma. No acute abnormality. Musculoskeletal: Degenerative changes are present in the thoracic spine. There stable bony deformity of the ribs on the right. No acute osseous abnormality is seen. IMPRESSION: 1. No definite evidence of pulmonary embolism. Examination is limited due to respiratory motion artifact. 2. Small bilateral pleural effusions with atelectasis. 3. Stable right lower lobe mass with fiducial marker. 4. Cardiomegaly with coronary artery calcifications. 5. Aortic atherosclerosis. Electronically Signed   By: Leita Birmingham M.D.   On: 09/24/2023 17:12   DG Chest Portable 1 View Result Date: 09/24/2023 EXAM: 1 VIEW XRAY OF THE CHEST 09/24/2023 06:40:47 AM COMPARISON: PA and lateral radiographs of the chest dated 09/03/2023. CLINICAL HISTORY:  Shortness of breath. Patient called EMS for difficulty breathing, administered Duonebs and Solumedrol. FINDINGS: LUNGS AND PLEURA: Hazy and reticular opacities present within the lung bases bilaterally. No pleural effusion. No pneumothorax. HEART AND MEDIASTINUM: The heart is enlarged. Status post aortic valve repair. Moderate calcification within the aortic arch. BONES AND SOFT TISSUES: No acute osseous abnormality. IMPRESSION: 1. Hazy and reticular opacities within the lung bases bilaterally. 2. Enlarged heart and status post aortic valve repair with moderate calcification within the aortic arch. Electronically signed by: Evalene Coho MD 09/24/2023 06:45 AM EDT  RP Workstation: [id number]   DG Chest 2 View Result Date: 09/03/2023 EXAM: 2 VIEW(S) XRAY OF THE CHEST 09/03/2023 10:58:00 AM COMPARISON: 08/12/2023 CLINICAL HISTORY: SOB. Per chart: Pt via EMS from home c/o SOB with wheezing and accessory muscle use. Hx COPD. Albuterol  given en route. O2 90% RA on scene, increased to 96% after albuterol . Lungs clear bilaterally. FINDINGS: LUNGS AND PLEURA: Interval increase opacification within the right lung base, which may represent atelectasis or airspace disease. Small bilateral pleural effusions identified. HEART AND MEDIASTINUM: Cardiac enlargement. Status post TAVR. Aortic atherosclerotic calcification. BONES AND SOFT TISSUES: No acute osseous abnormality. IMPRESSION: 1. Interval increase opacification within the right lung base, possibly representing atelectasis or airspace disease. 2. Small bilateral pleural effusions. 3. Cardiac enlargement, status post TAVR, and aortic atherosclerotic calcification. Electronically signed by: Waddell Calk MD 09/03/2023 11:19 AM EDT RP Workstation: [id number]    Microbiology: Results for orders placed or performed during the hospital encounter of 09/24/23  Respiratory (~20 pathogens) panel by PCR     Status: None   Collection Time: 09/24/23 12:58 PM   Specimen:  Nasopharyngeal Swab; Respiratory  Result Value Ref Range Status   Adenovirus NOT DETECTED NOT DETECTED Final   Coronavirus 229E NOT DETECTED NOT DETECTED Final    Comment: (NOTE) The Coronavirus on the Respiratory Panel, DOES NOT test for the novel  Coronavirus (2019 nCoV)    Coronavirus HKU1 NOT DETECTED NOT DETECTED Final   Coronavirus NL63 NOT DETECTED NOT DETECTED Final   Coronavirus OC43 NOT DETECTED NOT DETECTED Final   Metapneumovirus NOT DETECTED NOT DETECTED Final   Rhinovirus / Enterovirus NOT DETECTED NOT DETECTED Final   Influenza A NOT DETECTED NOT DETECTED Final   Influenza B NOT DETECTED NOT DETECTED Final   Parainfluenza Virus 1 NOT DETECTED NOT DETECTED Final   Parainfluenza Virus 2 NOT DETECTED NOT DETECTED Final   Parainfluenza Virus 3 NOT DETECTED NOT DETECTED Final   Parainfluenza Virus 4 NOT DETECTED NOT DETECTED Final   Respiratory Syncytial Virus NOT DETECTED NOT DETECTED Final   Bordetella pertussis NOT DETECTED NOT DETECTED Final   Bordetella Parapertussis NOT DETECTED NOT DETECTED Final   Chlamydophila pneumoniae NOT DETECTED NOT DETECTED Final   Mycoplasma pneumoniae NOT DETECTED NOT DETECTED Final    Comment: Performed at [facility], [address], [place], [place] [address]  Resp panel by RT-PCR (RSV, Flu A&B, Covid) Anterior Nasal Swab     Status: None   Collection Time: 09/24/23 12:58 PM   Specimen: Anterior Nasal Swab  Result Value Ref Range Status   SARS Coronavirus 2 by RT PCR NEGATIVE NEGATIVE Final    Comment: (NOTE) SARS-CoV-2 target nucleic acids are NOT DETECTED.  The SARS-CoV-2 RNA is generally detectable in upper respiratory specimens during the acute phase of infection. The lowest concentration of SARS-CoV-2 viral copies this assay can detect is 138 copies/mL. A negative result does not preclude SARS-Cov-2 infection and should not be used as the sole basis for treatment or other patient management decisions. A negative result  may occur with  improper specimen collection/handling, submission of specimen other than nasopharyngeal swab, presence of viral mutation(s) within the areas targeted by this assay, and inadequate number of viral copies(<138 copies/mL). A negative result must be combined with clinical observations, patient history, and epidemiological information. The expected result is Negative.  Fact Sheet for Patients:  BloggerCourse.com  Fact Sheet for Healthcare Providers:  SeriousBroker.it  This test is no t yet approved or cleared by the [place]  [place] FDA and  has been authorized for detection and/or diagnosis of SARS-CoV-2 by FDA under an Emergency Use Authorization (EUA). This EUA will remain  in effect (meaning this test can be used) for the duration of the COVID-19 declaration under Section 564(b)(1) of the Act, 21 U.S.C.section 360bbb-3(b)(1), unless the authorization is terminated  or revoked sooner.       Influenza A by PCR NEGATIVE NEGATIVE Final   Influenza B by PCR NEGATIVE NEGATIVE Final    Comment: (NOTE) The Xpert Xpress SARS-CoV-2/FLU/RSV plus assay is intended as an aid in the diagnosis of influenza from Nasopharyngeal swab specimens and should not be used as a sole basis for treatment. Nasal washings and aspirates are unacceptable for Xpert Xpress SARS-CoV-2/FLU/RSV testing.  Fact Sheet for Patients: BloggerCourse.com  Fact Sheet for Healthcare Providers: SeriousBroker.it  This test is not yet approved or cleared by the [place]  FDA and has been authorized for detection and/or diagnosis of SARS-CoV-2 by FDA under an Emergency Use Authorization (EUA). This EUA will remain in effect (meaning this test can be used) for the duration of the COVID-19 declaration under Section 564(b)(1) of the Act, 21 U.S.C. section 360bbb-3(b)(1), unless the authorization is terminated  or revoked.     Resp Syncytial Virus by PCR NEGATIVE NEGATIVE Final    Comment: (NOTE) Fact Sheet for Patients: BloggerCourse.com  Fact Sheet for Healthcare Providers: SeriousBroker.it  This test is not yet approved or cleared by the [place]  FDA and has been authorized for detection and/or diagnosis of SARS-CoV-2 by FDA under an Emergency Use Authorization (EUA). This EUA will remain in effect (meaning this test can be used) for the duration of the COVID-19 declaration under Section 564(b)(1) of the Act, 21 U.S.C. section 360bbb-3(b)(1), unless the authorization is terminated or revoked.  Performed at [facility], [address], [place], [place] [address]   MRSA Next Gen by PCR, Nasal     Status: None   Collection Time: 09/24/23  1:33 PM   Specimen: Anterior Nasal Swab  Result Value Ref Range Status   MRSA by PCR Next Gen NOT DETECTED NOT DETECTED Final    Comment: (NOTE) The GeneXpert MRSA Assay (FDA approved for NASAL specimens only), is one component of a comprehensive MRSA colonization surveillance program. It is not intended to diagnose MRSA infection nor to guide or monitor treatment for MRSA infections. Test performance is not FDA approved in patients less than 32 years old. Performed at [facility], [address], [place], [place] [address]     Labs: CBC: Recent Labs  Lab 09/24/23 0620  WBC 11.0*  HGB 11.0*  HCT 35.5*  MCV 92.0  PLT 180   Basic Metabolic Panel: Recent Labs  Lab 09/24/23 0620 09/25/23 0443 09/26/23 0423  NA 139 135 136  K 4.9 5.0 5.4*  CL 103 99 101  CO2 27 25 27   GLUCOSE 70 204* 201*  BUN 30* 43* 48*  CREATININE 1.89* 2.06* 1.88*  CALCIUM  8.1* 7.9* 8.0*  MG  --  1.9 2.0   Liver Function Tests: No results for input(s): AST, ALT, ALKPHOS, BILITOT, PROT, ALBUMIN in the last 168 hours. CBG: No results for input(s): GLUCAP in the last 168 hours.  Discharge  time spent: greater than 30 minutes.  Signed: Alm Schneider, MD [facility] 09/26/2023

## 2023-09-26 NOTE — Progress Notes (Signed)
 Heart Failure Nurse Navigator Progress Note  PCP: [facility] PCP-Cardiologist: Jayson Sierras, MD Admission Diagnosis: Shortness of Breath Admitted from: BIB EMS  [facility]: Unable to reach patient while admitted at [facility] due to phone being off the hook and no cell phone number listed. RN at [facility] notified.  Presentation:   Stephanie [name] presented with shortness of breath since 3 AM.  Worse when trying to lay flat.  Received DuoNebs and steroids from EMS and started to feel better.  Yk:RNEI, CKD, Diabetes, & TAVR. BNP 724.0. Troponin 135. Chest x-ray: enlarged heart and status post aortic valve repair with moderate calcification with the aortic arch. Hazy and reticular opacities within the lung bases bilaterally.    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 Wonda and Dr. Lucas.   Severe aortic stenosis    Symptomatic anemia    Presumed GI bleed January 2021 - she declined GI work-up   Type 2 diabetes mellitus (HCC)      Social History   Socioeconomic History   Marital status: Divorced    Spouse name: Not on file   Number of children: 3   Years of education: Not on file   Highest education level: Not on file  Occupational History   Occupation: Retired-cook   Tobacco Use   Smoking status: Former    Current packs/day: 1.00    Types: Cigarettes    Passive exposure: Never   Smokeless tobacco: Never   Tobacco comments:    Quit over 40 years ago.  Does not know how many years she smoked or start/stop date  Vaping Use   Vaping status: Never Used  Substance  and Sexual Activity   Alcohol use: No   Drug use: No   Sexual activity: Never  Other Topics Concern   Not on file  Social History Narrative   ** Merged History Encounter **       Social Drivers of Health   Financial Resource Strain: Not on file  Food Insecurity: No Food Insecurity (09/24/2023)   Hunger Vital Sign    Worried About Running Out of Food in the Last Year: Never true    Ran Out of Food in the Last Year: Never true  Transportation Needs: No Transportation Needs (09/24/2023)   PRAPARE - Administrator, Civil Service (Medical): No    Lack of Transportation (Non-Medical): No  Physical Activity: Not on file  Stress: Not on file  Social Connections: Moderately Isolated (09/24/2023)   Social Connection and Isolation Panel    Frequency of Communication with Friends and Family: Once a week    Frequency of Social Gatherings with Friends and Family: Never    Attends Religious Services: More than 4 times per year    Active Member of [employer] or Organizations: Yes    Attends Banker Meetings: Never    Marital Status: Divorced   Education Assessment and Provision:  Detailed education and instructions provided on heart failure disease management including the following:  Signs  and symptoms of Heart Failure When to call the physician Importance of daily weights Low sodium diet Fluid restriction Medication management Anticipated future follow-up appointments  Patient education given on each of the above topics.  Patient acknowledges understanding via teach back method and acceptance of all instructions.  Education Materials:  Living Better With Heart Failure Booklet, HF zone tool, & Daily Weight Tracker Tool.  Patient has scale at home: Per notes patient was provided with 1 for discharge. Patient has pill box at home: Patient receives pill packs from the pharmacy.    High Risk Criteria for Readmission and/or Poor Patient Outcomes: Heart failure hospital  admissions (last 6 months): 2  No Show rate: 11% Difficult social situation: None Demonstrates medication adherence: Yes Primary Language: English Literacy level: Reading, Writing, & Comprehension  Barriers of Care:   None  Considerations/Referrals:   Referral made to Heart Failure Pharmacist Stewardship: No Referral made to Heart Failure CSW/NCM TOC: No Referral made to Heart & Vascular TOC clinic: 10/03/23 @ 11:00  Items for Follow-up on DC/TOC: Daily Weights Diet & Fluid Restrictions Continued Heart Failure Education  Charmaine Pines, RN, BSN [facility] Heart Failure Navigator Secure Chat Only

## 2023-09-27 ENCOUNTER — Telehealth: Payer: Self-pay

## 2023-09-29 ENCOUNTER — Ambulatory Visit: Admitting: Emergency Medicine

## 2023-09-30 ENCOUNTER — Telehealth: Payer: Self-pay | Admitting: Family

## 2023-09-30 NOTE — Telephone Encounter (Signed)
 Called to confirm/remind patient of their appointment at the [facility] Failure Clinic on 10/03/23.   Appointment:   [] Confirmed  [] Left mess   [] No answer/No voice mail  [x] VM Full/unable to leave message  [] Phone not in service  Patient reminded to bring all medications and/or complete list.  Confirmed patient has transportation. Gave directions, instructed to utilize valet parking.

## 2023-10-02 NOTE — Progress Notes (Deleted)
 Advanced Heart Failure Clinic Note   Referring Physician: 09/25 admission PCP: [facility] Cardiologist: Jayson Sierras, MD   Chief Complaint:    HPI:  Stephanie [name] is a 88 y/o female with a history of asthma/COPD, paroxysmal atrial fibrillation, CKD stage III, aortic stenosis status post TAVR 2022, diabetes mellitus type 2, hypertension, hyperlipidemia, lung cancer status post SBRT 02/2022, GI bleed (01/2019) & HFpEF.   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   ED visit on September 07, 2022 for evaluation of pleuritic chest pain and shortness of breath. Denied any chest trauma. CT of chest, abdomen, and pelvis noted below. There was nothing acute noted however there was a right lower pulmonary nodule that has substantially enlarged from previous study was now considered a mixed solid and subsolid mass with solid component. There was no lymphadenopathy or definite signs of metastatic disease outside of the thorax at that time. CT also revealed evidence of colonic diverticulosis, stable left adrenal nodule felt to be likely benign lesions, hepatic steatosis. CXR with opacities in right lower lung, felt to be atypical infection or aspiration. Was told to follow-up with pulmonology outpatient.   Underwent robotic assisted bronchoscopy on September 21, 2022. She was noted to have acute bradycardia post anesthesia. EKG appeared like ectopic rhythm with PACs, was found to be in sinus rhythm in 60s with PACs on telemetry. Was not found to be in heart block. It was suspected that bradycardia was in the setting of anesthesia.   Echo 09/23/22: EF 55 to 60%, severe LVH, stable function of aortic valve replacement.   Admitted 09/03/2023 to 09/08/2023 when she was treated for COPD exacerbation and right lower lobe pneumonia. She was discharged home with a prednisone  taper, cefdinir , and azithromycin . She was also given 3 days of furosemide  at the time of  discharge and then to use it as needed.   Admitted 09/24/23 with SOB X 2 days. Oxygen  saturation 86% on room air.  She was placed on 2 L with saturation 95%. WBC 11.0, hemoglobin 11.0,  serum creatinine 1.89.  Chest x-ray showed hazy reticular opacities in the basilar area. IV diuresed. CT chest--sm bilateral pleural effusions; bibasilar atelectasis; stable RLL mass. Weaned off oxygen . 09/25/23 Echo--EF 60-65%, mod LVH, normal TAVR fxn. Renal function worsened so IV lasix  held. Elevated troponin thought to be due to demand ischemia. Not anticoagulated due to GIB. ARB held due to worsening renal function.   She presents today for her initial TOC HF visit with a chief complaint of   Review of Systems: [y] = yes, [ ]  = no   General: Weight gain [ ] ; Weight loss [ ] ; Anorexia [ ] ; Fatigue [ ] ; Fever [ ] ; Chills [ ] ; Weakness [ ]   Cardiac: Chest pain/pressure [ ] ; Resting SOB [ ] ; Exertional SOB [ ] ; Orthopnea [ ] ; Pedal Edema [ ] ; Palpitations [ ] ; Syncope [ ] ; Presyncope [ ] ; Paroxysmal nocturnal dyspnea[ ]   Pulmonary: Cough [ ] ; Wheezing[ ] ; Hemoptysis[ ] ; Sputum [ ] ; Snoring [ ]   GI: Vomiting[ ] ; Dysphagia[ ] ; Melena[ ] ; Hematochezia [ ] ; Heartburn[ ] ; Abdominal pain [ ] ; Constipation [ ] ; Diarrhea [ ] ; BRBPR [ ]   GU: Hematuria[ ] ; Dysuria [ ] ; Nocturia[ ]   Vascular: Pain in legs with walking [ ] ; Pain in feet with lying flat [ ] ; Non-healing sores [ ] ; Stroke [ ] ; TIA [ ] ; Slurred speech [ ] ;  Neuro: Headaches[ ] ; Vertigo[ ] ; Seizures[ ] ; Paresthesias[ ] ;Blurred vision [ ] ;  Diplopia [ ] ; Vision changes [ ]   Ortho/Skin: Arthritis [ ] ; Joint pain [ ] ; Muscle pain [ ] ; Joint swelling [ ] ; Back Pain [ ] ; Rash [ ]   Psych: Depression[ ] ; Anxiety[ ]   Heme: Bleeding problems [ ] ; Clotting disorders [ ] ; Anemia [ ]   Endocrine: Diabetes [ ] ; Thyroid  dysfunction[ ]    Past Medical History:  Diagnosis Date   Arthritis    Asthma    CKD (chronic kidney disease)    COPD (chronic obstructive pulmonary disease)  (HCC)    Essential hypertension    Heart murmur    Mixed hyperlipidemia    PAF (paroxysmal atrial fibrillation) (HCC)    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 Wonda and Dr. Lucas.   Severe aortic stenosis    Symptomatic anemia    Presumed GI bleed January 2021 - she declined GI work-up   Type 2 diabetes mellitus (HCC)     Current Outpatient Medications  Medication Sig Dispense Refill   albuterol  (PROVENTIL ) (2.5 MG/3ML) 0.083% nebulizer solution Take 3 mLs (2.5 mg total) by nebulization every 2 (two) hours as needed for wheezing or shortness of breath. 75 mL 3   amLODipine  (NORVASC ) 2.5 MG tablet Take 1 tablet (2.5 mg total) by mouth daily. 30 tablet 1   carvedilol  (COREG ) 6.25 MG tablet Take 6.25 mg by mouth 2 (two) times daily with a meal.     cetirizine (ZYRTEC) 10 MG tablet Take 10 mg by mouth at bedtime.     cinacalcet  (SENSIPAR ) 30 MG tablet Take 30 mg by mouth daily.     FEROSUL 325 (65 Fe) MG tablet Take 325 mg by mouth daily.     furosemide  (LASIX ) 20 MG tablet Take 1 tablet (20 mg total) by mouth daily for 3 days, THEN 1 tablet (20 mg total) daily as needed (leg edema, shortness of breath, or weight gain.). (Patient taking differently: Take 1 -2 tablet (20 mg -40mg  total) by mouth daily for 3 days, THEN 1 tablet (20 mg total) daily as needed (leg edema, shortness of breath, or weight gain.).) 90 tablet 3   gabapentin  (NEURONTIN ) 100 MG capsule Take 100-200 mg by mouth at bedtime.     GNP VITAMIN D3 EXTRA STRENGTH 25 MCG (1000 UT) tablet Take 1,000 Units by mouth daily.     hydrALAZINE  (APRESOLINE ) 50 MG tablet Take 1 tablet (50 mg total) by mouth 3 (three) times daily. 90 tablet 5   isosorbide  mononitrate (IMDUR ) 30 MG 24 hr tablet Take 1 tablet (30 mg total) by mouth daily. 30 tablet 11   LANTUS SOLOSTAR 100 UNIT/ML Solostar Pen Inject 40 Units into the skin at bedtime.     predniSONE   (DELTASONE ) 10 MG tablet Take 6 tablets (60 mg total) by mouth daily with breakfast. And decrease by 1 tablet daily 21 tablet 0   Tiotropium Bromide-Olodaterol (STIOLTO RESPIMAT ) 2.5-2.5 MCG/ACT AERS Inhale 2 puffs into the lungs daily. 4 g 11   No current facility-administered medications for this visit.    Allergies  Allergen Reactions   Amoxicillin Swelling    Tongue swelling    Nitrofurantoin Nausea And Vomiting   Penicillins Swelling   Sulfa Antibiotics Swelling    Lip swelling      Social History   Socioeconomic History   Marital status: Divorced    Spouse name: Not on file   Number of children: 3  Years of education: Not on file   Highest education level: Not on file  Occupational History   Occupation: Retired-cook   Tobacco Use   Smoking status: Former    Current packs/day: 1.00    Types: Cigarettes    Passive exposure: Never   Smokeless tobacco: Never   Tobacco comments:    Quit over 40 years ago.  Does not know how many years she smoked or start/stop date  Vaping Use   Vaping status: Never Used  Substance and Sexual Activity   Alcohol use: No   Drug use: No   Sexual activity: Never  Other Topics Concern   Not on file  Social History Narrative   ** Merged History Encounter **       Social Drivers of Health   Financial Resource Strain: Not on file  Food Insecurity: No Food Insecurity (09/24/2023)   Hunger Vital Sign    Worried About Running Out of Food in the Last Year: Never true    Ran Out of Food in the Last Year: Never true  Transportation Needs: No Transportation Needs (09/24/2023)   PRAPARE - Administrator, Civil Service (Medical): No    Lack of Transportation (Non-Medical): No  Physical Activity: Not on file  Stress: Not on file  Social Connections: Moderately Isolated (09/24/2023)   Social Connection and Isolation Panel    Frequency of Communication with Friends and Family: Once a week    Frequency of Social Gatherings with  Friends and Family: Never    Attends Religious Services: More than 4 times per year    Active Member of [employer] or Organizations: Yes    Attends Banker Meetings: Never    Marital Status: Divorced  Catering manager Violence: Not At Risk (09/24/2023)   Humiliation, Afraid, Rape, and Kick questionnaire    Fear of Current or Ex-Partner: No    Emotionally Abused: No    Physically Abused: No    Sexually Abused: No      Family History  Problem Relation Age of Onset   Lung disease Other    Cancer Other    Arthritis Other    Asthma Other    Diabetes Other    Diabetes Mother    Hypertension Mother    Diverticulitis Mother    Diabetes Father    Hypertension Sister    Diabetes Brother    Colon cancer Neg Hx        PHYSICAL EXAM: General:  Well appearing. No respiratory difficulty HEENT: normal Neck: supple. no JVD. Carotids 2+ bilat; no bruits. No lymphadenopathy or thyromegaly appreciated. Cor: PMI nondisplaced. Regular rate & rhythm. No rubs, gallops or murmurs. Lungs: clear Abdomen: soft, nontender, nondistended. No hepatosplenomegaly. No bruits or masses. Good bowel sounds. Extremities: no cyanosis, clubbing, rash, edema Neuro: alert & oriented x 3, cranial nerves grossly intact. moves all 4 extremities w/o difficulty. Affect pleasant.  ECG:   ASSESSMENT & PLAN:  1: HFpEF- - suspect due to  - NYHA class - euvolemic - weighing daily - Echo 09/23/22: EF 55 to 60%, severe LVH, stable function of aortic valve replacement.  - Echo 09/25/23: EF 60-65%, mod LVH, normal TAVR fxn. - continue  - BNP 09/24/23 was 724.0  2: HTN- - BP - sees PCP at [facility] 09/26/23 reviewed: sodium 136, potassium 5.4, creatinine 1.88, GFR 26  3: PAF- - EKG today - saw cardiology [place]) 08/25  4: T2DM- - A1c 09/26/23 was 6.5%  5: COPD- - saw pulmonology ([place]) 09/25  6: HLD- - LDL 04/11/20 was 81  7: Lung cancer- -   8: Nonobstructive CAD- -  Cardiac catheterization in March 2022 revealed nonobstructive CAD.    Ellouise DELENA Class, FNP 10/02/23

## 2023-10-03 ENCOUNTER — Encounter: Admitting: Family

## 2023-10-03 ENCOUNTER — Telehealth: Payer: Self-pay | Admitting: Family

## 2023-10-03 NOTE — Telephone Encounter (Signed)
 Patient did not show for her initial Heart Failure Clinic appointment on 10/03/23.

## 2023-10-18 ENCOUNTER — Emergency Department (HOSPITAL_COMMUNITY)
Admission: EM | Admit: 2023-10-18 | Discharge: 2023-10-18 | Disposition: A | Discharge status: Home / Self Care | Attending: Emergency Medicine | Admitting: Emergency Medicine

## 2023-10-18 ENCOUNTER — Emergency Department (HOSPITAL_COMMUNITY)

## 2023-10-18 ENCOUNTER — Encounter (HOSPITAL_COMMUNITY): Payer: Self-pay

## 2023-10-18 ENCOUNTER — Other Ambulatory Visit: Payer: Self-pay

## 2023-10-18 DIAGNOSIS — E1122 Type 2 diabetes mellitus with diabetic chronic kidney disease: Secondary | ICD-10-CM | POA: Insufficient documentation

## 2023-10-18 DIAGNOSIS — I130 Hypertensive heart and chronic kidney disease with heart failure and stage 1 through stage 4 chronic kidney disease, or unspecified chronic kidney disease: Secondary | ICD-10-CM | POA: Insufficient documentation

## 2023-10-18 DIAGNOSIS — N189 Chronic kidney disease, unspecified: Secondary | ICD-10-CM | POA: Insufficient documentation

## 2023-10-18 DIAGNOSIS — Z794 Long term (current) use of insulin: Secondary | ICD-10-CM | POA: Diagnosis not present

## 2023-10-18 DIAGNOSIS — R1084 Generalized abdominal pain: Secondary | ICD-10-CM | POA: Diagnosis present

## 2023-10-18 DIAGNOSIS — Z79899 Other long term (current) drug therapy: Secondary | ICD-10-CM | POA: Diagnosis not present

## 2023-10-18 DIAGNOSIS — Z7984 Long term (current) use of oral hypoglycemic drugs: Secondary | ICD-10-CM | POA: Insufficient documentation

## 2023-10-18 DIAGNOSIS — I509 Heart failure, unspecified: Secondary | ICD-10-CM | POA: Diagnosis not present

## 2023-10-18 DIAGNOSIS — J449 Chronic obstructive pulmonary disease, unspecified: Secondary | ICD-10-CM | POA: Insufficient documentation

## 2023-10-18 LAB — CBC WITH DIFFERENTIAL/PLATELET
Abs Immature Granulocytes: 0.01 K/uL (ref 0.00–0.07)
Basophils Absolute: 0 K/uL (ref 0.0–0.1)
Basophils Relative: 0 %
Eosinophils Absolute: 0.1 K/uL (ref 0.0–0.5)
Eosinophils Relative: 2 %
HCT: 39 % (ref 36.0–46.0)
Hemoglobin: 12 g/dL (ref 12.0–15.0)
Immature Granulocytes: 0 %
Lymphocytes Relative: 10 %
Lymphs Abs: 0.6 K/uL — ABNORMAL LOW (ref 0.7–4.0)
MCH: 27.3 pg (ref 26.0–34.0)
MCHC: 30.8 g/dL (ref 30.0–36.0)
MCV: 88.8 fL (ref 80.0–100.0)
Monocytes Absolute: 0.9 K/uL (ref 0.1–1.0)
Monocytes Relative: 15 %
Neutro Abs: 4.3 K/uL (ref 1.7–7.7)
Neutrophils Relative %: 73 %
Platelets: 149 K/uL — ABNORMAL LOW (ref 150–400)
RBC: 4.39 MIL/uL (ref 3.87–5.11)
RDW: 15.2 % (ref 11.5–15.5)
WBC: 5.9 K/uL (ref 4.0–10.5)
nRBC: 0 % (ref 0.0–0.2)

## 2023-10-18 LAB — URINALYSIS, ROUTINE W REFLEX MICROSCOPIC
Bilirubin Urine: NEGATIVE
Glucose, UA: NEGATIVE mg/dL
Hgb urine dipstick: NEGATIVE
Ketones, ur: NEGATIVE mg/dL
Leukocytes,Ua: NEGATIVE
Nitrite: NEGATIVE
Protein, ur: 100 mg/dL — AB
Specific Gravity, Urine: 1.016 (ref 1.005–1.030)
pH: 5 (ref 5.0–8.0)

## 2023-10-18 LAB — COMPREHENSIVE METABOLIC PANEL WITH GFR
ALT: 8 U/L (ref 0–44)
AST: 16 U/L (ref 15–41)
Albumin: 3.8 g/dL (ref 3.5–5.0)
Alkaline Phosphatase: 113 U/L (ref 38–126)
Anion gap: 11 (ref 5–15)
BUN: 20 mg/dL (ref 8–23)
CO2: 25 mmol/L (ref 22–32)
Calcium: 9.1 mg/dL (ref 8.9–10.3)
Chloride: 101 mmol/L (ref 98–111)
Creatinine, Ser: 1.63 mg/dL — ABNORMAL HIGH (ref 0.44–1.00)
GFR, Estimated: 31 mL/min — ABNORMAL LOW (ref >60)
Glucose, Bld: 61 mg/dL — ABNORMAL LOW (ref 70–99)
Potassium: 5 mmol/L (ref 3.5–5.1)
Sodium: 136 mmol/L (ref 135–145)
Total Bilirubin: 0.7 mg/dL (ref 0.0–1.2)
Total Protein: 6.4 g/dL — ABNORMAL LOW (ref 6.5–8.1)

## 2023-10-18 LAB — LIPASE, BLOOD: Lipase: 29 U/L (ref 11–51)

## 2023-10-18 LAB — LACTIC ACID, PLASMA: Lactic Acid, Venous: 0.8 mmol/L (ref 0.5–1.9)

## 2023-10-18 LAB — BETA-HYDROXYBUTYRIC ACID: Beta-Hydroxybutyric Acid: 0.17 mmol/L (ref 0.05–0.27)

## 2023-10-18 MED ORDER — ALUM & MAG HYDROXIDE-SIMETH 200-200-20 MG/5ML PO SUSP
30.0000 mL | Freq: Once | ORAL | Status: AC
  Administered 2023-10-18: 30 mL via ORAL
  Filled 2023-10-18: qty 30

## 2023-10-18 MED ORDER — SUCRALFATE 1 GM/10ML PO SUSP: 1.0000 g | Freq: Three times a day (TID) | ORAL | 0 refills | Status: DC

## 2023-10-18 MED ORDER — PANTOPRAZOLE SODIUM 20 MG PO TBEC: 20.0000 mg | DELAYED_RELEASE_TABLET | Freq: Every day | ORAL | 2 refills | Status: DC

## 2023-10-18 MED ORDER — LIDOCAINE VISCOUS HCL 2 % MT SOLN
15.0000 mL | Freq: Once | OROMUCOSAL | Status: AC
  Administered 2023-10-18: 15 mL via ORAL
  Filled 2023-10-18: qty 15

## 2023-10-18 MED ORDER — FAMOTIDINE IN NACL 20-0.9 MG/50ML-% IV SOLN
20.0000 mg | Freq: Once | INTRAVENOUS | Status: AC
  Administered 2023-10-18: 20 mg via INTRAVENOUS
  Filled 2023-10-18: qty 50

## 2023-10-18 MED ORDER — HYDROMORPHONE HCL 1 MG/ML IJ SOLN
0.5000 mg | Freq: Once | INTRAMUSCULAR | Status: AC
  Administered 2023-10-18: 0.5 mg via INTRAVENOUS
  Filled 2023-10-18: qty 0.5

## 2023-10-18 NOTE — ED Provider Notes (Signed)
 [place] EMERGENCY DEPARTMENT AT [facility] Provider Note   CSN: [id number] Arrival date & time: 10/18/23  1451     Patient presents with: Abdominal Pain   Stephanie [name] is a 88 y.o. female.    Abdominal Pain Patient presents for abdominal pain.  Medical history includes DM, HTN, HLD, arthritis, aortic stenosis s/p TAVR, COPD, CKD, CHF.  Patient reports generalized abdominal pain for the past 2 weeks.  Pain is most prominent in periumbilical and left flank area.  It is worsened with eating.  She denies any associated nausea.  She states that she has had normal, nonbloody bowel movements.  She has not had any urinary symptoms.  Current pain is 10/10 in severity.  She does not take anything at home for the pain.     Prior to Admission medications   Medication Sig Start Date End Date Taking? Authorizing Provider  pantoprazole (PROTONIX) 20 MG tablet Take 1 tablet (20 mg total) by mouth daily. 10/18/23 01/16/24 Yes Melvenia Motto, MD  sucralfate (CARAFATE) 1 GM/10ML suspension Take 10 mLs (1 g total) by mouth with breakfast, with lunch, and with evening meal. 10/18/23  Yes Melvenia Motto, MD  albuterol  (PROVENTIL ) (2.5 MG/3ML) 0.083% nebulizer solution Take 3 mLs (2.5 mg total) by nebulization every 2 (two) hours as needed for wheezing or shortness of breath. 08/12/23   Pearlean Manus, MD  amLODipine  (NORVASC ) 2.5 MG tablet Take 1 tablet (2.5 mg total) by mouth daily. 09/26/23   Evonnie Lenis, MD  carvedilol  (COREG ) 6.25 MG tablet Take 6.25 mg by mouth 2 (two) times daily with a meal.    [provider]  cetirizine (ZYRTEC) 10 MG tablet Take 10 mg by mouth at bedtime.    [provider]  cinacalcet  (SENSIPAR ) 30 MG tablet Take 30 mg by mouth daily. 03/31/23   [provider]  FEROSUL 325 (65 Fe) MG tablet Take 325 mg by mouth daily.    [provider]  furosemide  (LASIX ) 20 MG tablet Take 1 tablet (20 mg total) by mouth daily for 3 days, THEN 1 tablet  (20 mg total) daily as needed (leg edema, shortness of breath, or weight gain.). Patient taking differently: Take 1 -2 tablet (20 mg -40mg  total) by mouth daily for 3 days, THEN 1 tablet (20 mg total) daily as needed (leg edema, shortness of breath, or weight gain.). 08/30/23 12/01/23  Miriam Norris, NP  gabapentin  (NEURONTIN ) 100 MG capsule Take 100-200 mg by mouth at bedtime. 10/02/19   [provider]  GNP VITAMIN D3 EXTRA STRENGTH 25 MCG (1000 UT) tablet Take 1,000 Units by mouth daily. 03/31/23   [provider]  hydrALAZINE  (APRESOLINE ) 50 MG tablet Take 1 tablet (50 mg total) by mouth 3 (three) times daily. 08/12/23   Pearlean Manus, MD  isosorbide  mononitrate (IMDUR ) 30 MG 24 hr tablet Take 1 tablet (30 mg total) by mouth daily. 08/12/23 08/11/24  Pearlean Manus, MD  LANTUS SOLOSTAR 100 UNIT/ML Solostar Pen Inject 40 Units into the skin at bedtime.    [provider]  predniSONE  (DELTASONE ) 10 MG tablet Take 6 tablets (60 mg total) by mouth daily with breakfast. And decrease by 1 tablet daily 09/27/23   Tat, Lenis, MD  Tiotropium Bromide-Olodaterol (STIOLTO RESPIMAT ) 2.5-2.5 MCG/ACT AERS Inhale 2 puffs into the lungs daily. 09/21/23   Shelah Lamar RAMAN, MD    Allergies: Amoxicillin, Nitrofurantoin, Penicillins, and Sulfa antibiotics    Review of Systems  Gastrointestinal:  Positive for abdominal pain.  Genitourinary:  Positive for flank pain.  All other systems reviewed and are negative.   Updated Vital Signs BP (!) 196/69   Pulse (!) 55   Temp 98.8 F (37.1 C) (Oral)   Resp 18   Ht 5' 5 (1.651 m)   Wt 91.6 kg   SpO2 93%   BMI 33.60 kg/m   Physical Exam Vitals and nursing note reviewed.  Constitutional:      General: She is not in acute distress.    Appearance: She is well-developed. She is not ill-appearing, toxic-appearing or diaphoretic.  HENT:     Head: Normocephalic and atraumatic.     Mouth/Throat:     Mouth: Mucous membranes are moist.   Eyes:     Conjunctiva/sclera: Conjunctivae normal.  Cardiovascular:     Rate and Rhythm: Normal rate and regular rhythm.  Pulmonary:     Effort: Pulmonary effort is normal. No respiratory distress.  Abdominal:     Palpations: Abdomen is soft.     Tenderness: There is generalized abdominal tenderness. There is no guarding or rebound.  Musculoskeletal:        General: No swelling.     Cervical back: Neck supple.  Skin:    General: Skin is warm and dry.  Neurological:     General: No focal deficit present.     Mental Status: She is alert and oriented to person, place, and time.  Psychiatric:        Mood and Affect: Mood normal.        Behavior: Behavior normal.     (all labs ordered are listed, but only abnormal results are displayed) Labs Reviewed  COMPREHENSIVE METABOLIC PANEL WITH GFR - Abnormal; Notable for the following components:      Result Value   Glucose, Bld 61 (*)    Creatinine, Ser 1.63 (*)    Total Protein 6.4 (*)    GFR, Estimated 31 (*)    All other components within normal limits  URINALYSIS, ROUTINE W REFLEX MICROSCOPIC - Abnormal; Notable for the following components:   Protein, ur 100 (*)    Bacteria, UA RARE (*)    All other components within normal limits  CBC WITH DIFFERENTIAL/PLATELET - Abnormal; Notable for the following components:   Platelets 149 (*)    Lymphs Abs 0.6 (*)    All other components within normal limits  LIPASE, BLOOD  LACTIC ACID, PLASMA  BETA-HYDROXYBUTYRIC ACID    EKG: None  Radiology: CT ABDOMEN PELVIS WO CONTRAST Result Date: 10/18/2023 CLINICAL DATA:  Abdominal pain for 2 weeks. EXAM: CT ABDOMEN AND PELVIS WITHOUT CONTRAST TECHNIQUE: Multidetector CT imaging of the abdomen and pelvis was performed following the standard protocol without IV contrast. RADIATION DOSE REDUCTION: This exam was performed according to the departmental dose-optimization program which includes automated exposure control, adjustment of the mA and/or  kV according to patient size and/or use of iterative reconstruction technique. COMPARISON:  CT 04/07/2023 FINDINGS: Lower chest: Nodular density at the right lower lobe with fiducial marker is unchanged from chest CT earlier this month, only partially included in the field of view. Trace left pleural effusion, unchanged. Improved right pleural effusion. Hepatobiliary: Scattered cysts in the liver are unchanged from prior exam. The gallbladder is moderately distended. No calcified gallstone. No biliary dilatation. Pancreas: Mild parenchymal atrophy. No ductal dilatation or inflammation. Spleen: Normal in size without focal abnormality. Adrenals/Urinary Tract: Normal right adrenal gland. Stable 2.2 cm left adrenal adenoma. No hydronephrosis. Multiple right renal cysts. No further  follow-up imaging is recommended. Bilateral renal parenchymal thinning. No renal calculi. Partially distended urinary bladder, no wall thickening Stomach/Bowel: Colonic diverticulosis, prominent involving the distal descending and sigmoid colon. No diverticulitis. Moderate colonic stool burden. No small bowel distension or obstruction. Partially distended stomach is normal for degree of distension. The appendix is not seen, surgically absent per history. Vascular/Lymphatic: Aortic atherosclerosis. No aortic aneurysm. No suspicious lymphadenopathy. Reproductive: Uterus and bilateral adnexa are unremarkable. Other: No free air or ascites. Soft tissue densities in the lower anterior abdominal wall typical of medication injection sites. Fat containing left inguinal hernia. Musculoskeletal: Posterior L2 through S1 fusion. Advanced chronic changes at the L1-L2 disc space are stable from prior exam with endplate sclerosis, irregularity and vacuum phenomenon. Bilateral hip osteoarthritis. Fatty atrophy of the gluteal musculature. IMPRESSION: 1. No acute abnormality in the abdomen/pelvis. 2. Colonic diverticulosis without diverticulitis. 3. Stable  left adrenal adenoma. Aortic Atherosclerosis (ICD10-I70.0). Electronically Signed   By: Andrea Gasman M.D.   On: 10/18/2023 19:31     Procedures   Medications Ordered in the ED  HYDROmorphone (DILAUDID) injection 0.5 mg (0.5 mg Intravenous Given 10/18/23 1815)  alum & mag hydroxide-simeth (MAALOX/MYLANTA) 200-200-20 MG/5ML suspension 30 mL (30 mLs Oral Given 10/18/23 1754)    And  lidocaine  (XYLOCAINE ) 2 % viscous mouth solution 15 mL (15 mLs Oral Given 10/18/23 1754)  famotidine (PEPCID) IVPB 20 mg premix (0 mg Intravenous Stopped 10/18/23 1955)                                    Medical Decision Making Amount and/or Complexity of Data Reviewed Labs: ordered. Radiology: ordered.  Risk OTC drugs. Prescription drug management.   This patient presents to the ED for concern of abdominal pain, this involves an extensive number of treatment options, and is a complaint that carries with it a high risk of complications and morbidity.  The differential diagnosis includes PUD, gastritis, mesenteric ischemia, enteritis, colitis, nephrolithiasis   Co morbidities / Chronic conditions that complicate the patient evaluation  DM, HTN, HLD, arthritis, aortic stenosis s/p TAVR, COPD, CKD, CHF   Additional history obtained:  Additional history obtained from EMR External records from outside source obtained and reviewed including N/A   Lab Tests:  I Ordered, and personally interpreted labs.  The pertinent results include: Hemoglobin, no leukocytosis, baseline CKD, normal electrolytes, normal lactate, normal hepatobiliary enzymes, normal lipase   Imaging Studies ordered:  I ordered imaging studies including CT of abdomen and pelvis I independently visualized and interpreted imaging which showed no acute findings I agree with the radiologist interpretation   Cardiac Monitoring: / EKG:  The patient was maintained on a cardiac monitor.  I personally viewed and interpreted the cardiac  monitored which showed an underlying rhythm of: Sinus rhythm   Problem List / ED Course / Critical interventions / Medication management  Patient presenting for constant periumbilical and left flank pain over the past 2 weeks, worsened postprandially.  On arrival in the ED, patient is well-appearing.  She does not appear uncomfortable but does rate her pain at 10/10 in severity.  Tenderness is present without guarding.  Lab work shows normal hemoglobin, no leukocytosis, creatinine slightly improved from baseline, with normal electrolytes.  Lipase and hepatobiliary enzymes are normal.  Patient was given Dilaudid and GI cocktail for analgesia.  CT scan was ordered.  CT did not show any acute findings.  Patient did have relief  of her symptoms with GI cocktail.  She was able to eat and drink without any recurrence of symptoms.  She is not currently prescribed a PPI.  Prescription was provided.  Patient advised to follow-up with gastroenterology.  She was discharged in stable condition. I ordered medication including Dilaudid and GI cocktail for analgesia Reevaluation of the patient after these medicines showed that the patient improved I have reviewed the patients home medicines and have made adjustments as needed  Social Determinants of Health:  Has PCP     Final diagnoses:  Generalized abdominal pain    ED Discharge Orders          Ordered    pantoprazole (PROTONIX) 20 MG tablet  Daily        10/18/23 2044    sucralfate (CARAFATE) 1 GM/10ML suspension  3 times daily with meals        10/18/23 2044               Melvenia Motto, MD 10/18/23 2044

## 2023-10-18 NOTE — ED Triage Notes (Signed)
 Pt arrived via POV c/o generalized abdominal pain that began apprx 2 weeks ago. Pt deneis injury. Pt denies N/V/D. Pt reports pain is worse before and after eating. Pt denies hematuria.

## 2023-10-18 NOTE — Discharge Instructions (Signed)
 Your test results today were reassuring.  Prescriptions were sent to your pharmacy which should help with your abdominal pain.  Take as prescribed.  Call a telephone number below to follow-up with a stomach doctor.  Return to the emergency department for any new or worsening symptoms of concern.

## 2023-10-31 ENCOUNTER — Emergency Department (HOSPITAL_COMMUNITY)

## 2023-10-31 ENCOUNTER — Encounter (HOSPITAL_COMMUNITY): Payer: Self-pay | Admitting: Emergency Medicine

## 2023-10-31 ENCOUNTER — Other Ambulatory Visit (HOSPITAL_COMMUNITY): Payer: Self-pay | Admitting: *Deleted

## 2023-10-31 ENCOUNTER — Observation Stay (HOSPITAL_COMMUNITY)

## 2023-10-31 ENCOUNTER — Other Ambulatory Visit: Payer: Self-pay

## 2023-10-31 ENCOUNTER — Observation Stay (HOSPITAL_COMMUNITY)
Admission: EM | Admit: 2023-10-31 | Discharge: 2023-11-01 | Disposition: A | Attending: Family Medicine | Admitting: Family Medicine

## 2023-10-31 DIAGNOSIS — R079 Chest pain, unspecified: Secondary | ICD-10-CM | POA: Diagnosis present

## 2023-10-31 DIAGNOSIS — I259 Chronic ischemic heart disease, unspecified: Secondary | ICD-10-CM

## 2023-10-31 DIAGNOSIS — I5032 Chronic diastolic (congestive) heart failure: Secondary | ICD-10-CM | POA: Diagnosis not present

## 2023-10-31 DIAGNOSIS — Z794 Long term (current) use of insulin: Secondary | ICD-10-CM | POA: Insufficient documentation

## 2023-10-31 DIAGNOSIS — M160 Bilateral primary osteoarthritis of hip: Secondary | ICD-10-CM | POA: Diagnosis not present

## 2023-10-31 DIAGNOSIS — I480 Paroxysmal atrial fibrillation: Secondary | ICD-10-CM | POA: Diagnosis not present

## 2023-10-31 DIAGNOSIS — I249 Acute ischemic heart disease, unspecified: Principal | ICD-10-CM

## 2023-10-31 DIAGNOSIS — N179 Acute kidney failure, unspecified: Secondary | ICD-10-CM | POA: Insufficient documentation

## 2023-10-31 DIAGNOSIS — R0789 Other chest pain: Secondary | ICD-10-CM | POA: Diagnosis not present

## 2023-10-31 DIAGNOSIS — N289 Disorder of kidney and ureter, unspecified: Secondary | ICD-10-CM

## 2023-10-31 DIAGNOSIS — I700 Atherosclerosis of aorta: Secondary | ICD-10-CM | POA: Insufficient documentation

## 2023-10-31 DIAGNOSIS — K5730 Diverticulosis of large intestine without perforation or abscess without bleeding: Secondary | ICD-10-CM | POA: Insufficient documentation

## 2023-10-31 DIAGNOSIS — N184 Chronic kidney disease, stage 4 (severe): Secondary | ICD-10-CM | POA: Diagnosis not present

## 2023-10-31 DIAGNOSIS — I10 Essential (primary) hypertension: Secondary | ICD-10-CM | POA: Diagnosis not present

## 2023-10-31 DIAGNOSIS — E66811 Obesity, class 1: Secondary | ICD-10-CM | POA: Diagnosis not present

## 2023-10-31 DIAGNOSIS — I130 Hypertensive heart and chronic kidney disease with heart failure and stage 1 through stage 4 chronic kidney disease, or unspecified chronic kidney disease: Secondary | ICD-10-CM | POA: Diagnosis not present

## 2023-10-31 DIAGNOSIS — Z79899 Other long term (current) drug therapy: Secondary | ICD-10-CM | POA: Insufficient documentation

## 2023-10-31 DIAGNOSIS — E1122 Type 2 diabetes mellitus with diabetic chronic kidney disease: Secondary | ICD-10-CM | POA: Diagnosis not present

## 2023-10-31 DIAGNOSIS — Z6833 Body mass index (BMI) 33.0-33.9, adult: Secondary | ICD-10-CM | POA: Diagnosis not present

## 2023-10-31 DIAGNOSIS — J449 Chronic obstructive pulmonary disease, unspecified: Secondary | ICD-10-CM | POA: Diagnosis present

## 2023-10-31 DIAGNOSIS — D649 Anemia, unspecified: Secondary | ICD-10-CM

## 2023-10-31 LAB — ECHOCARDIOGRAM LIMITED
Height: 63 in
S' Lateral: 2.9 cm
Weight: 3326.3 [oz_av]

## 2023-10-31 LAB — BASIC METABOLIC PANEL WITH GFR
Anion gap: 9 (ref 5–15)
BUN: 42 mg/dL — ABNORMAL HIGH (ref 8–23)
CO2: 29 mmol/L (ref 22–32)
Calcium: 8.1 mg/dL — ABNORMAL LOW (ref 8.9–10.3)
Chloride: 100 mmol/L (ref 98–111)
Creatinine, Ser: 1.92 mg/dL — ABNORMAL HIGH (ref 0.44–1.00)
GFR, Estimated: 25 mL/min — ABNORMAL LOW (ref >60)
Glucose, Bld: 96 mg/dL (ref 70–99)
Potassium: 4.3 mmol/L (ref 3.5–5.1)
Sodium: 138 mmol/L (ref 135–145)

## 2023-10-31 LAB — CBC WITH DIFFERENTIAL/PLATELET
Abs Immature Granulocytes: 0.16 K/uL — ABNORMAL HIGH (ref 0.00–0.07)
Basophils Absolute: 0 K/uL (ref 0.0–0.1)
Basophils Relative: 0 %
Eosinophils Absolute: 0.1 K/uL (ref 0.0–0.5)
Eosinophils Relative: 0 %
HCT: 33.6 % — ABNORMAL LOW (ref 36.0–46.0)
Hemoglobin: 10.6 g/dL — ABNORMAL LOW (ref 12.0–15.0)
Immature Granulocytes: 1 %
Lymphocytes Relative: 8 %
Lymphs Abs: 1 K/uL (ref 0.7–4.0)
MCH: 27.9 pg (ref 26.0–34.0)
MCHC: 31.5 g/dL (ref 30.0–36.0)
MCV: 88.4 fL (ref 80.0–100.0)
Monocytes Absolute: 1.6 K/uL — ABNORMAL HIGH (ref 0.1–1.0)
Monocytes Relative: 14 %
Neutro Abs: 8.9 K/uL — ABNORMAL HIGH (ref 1.7–7.7)
Neutrophils Relative %: 77 %
Platelets: 181 K/uL (ref 150–400)
RBC: 3.8 MIL/uL — ABNORMAL LOW (ref 3.87–5.11)
RDW: 16 % — ABNORMAL HIGH (ref 11.5–15.5)
WBC: 11.7 K/uL — ABNORMAL HIGH (ref 4.0–10.5)
nRBC: 0 % (ref 0.0–0.2)

## 2023-10-31 LAB — MAGNESIUM: Magnesium: 1.9 mg/dL (ref 1.7–2.4)

## 2023-10-31 LAB — HEPARIN LEVEL (UNFRACTIONATED)
Heparin Unfractionated: 0.31 [IU]/mL (ref 0.30–0.70)
Heparin Unfractionated: 0.15 [IU]/mL — ABNORMAL LOW (ref 0.30–0.70)

## 2023-10-31 LAB — GLUCOSE, CAPILLARY
Glucose-Capillary: 133 mg/dL — ABNORMAL HIGH (ref 70–99)
Glucose-Capillary: 93 mg/dL (ref 70–99)
Glucose-Capillary: 196 mg/dL — ABNORMAL HIGH (ref 70–99)

## 2023-10-31 LAB — CBG MONITORING, ED: Glucose-Capillary: 81 mg/dL (ref 70–99)

## 2023-10-31 LAB — TROPONIN T, HIGH SENSITIVITY
Troponin T High Sensitivity: 53 ng/L — ABNORMAL HIGH (ref 0–19)
Troponin T High Sensitivity: 53 ng/L — ABNORMAL HIGH (ref 0–19)

## 2023-10-31 LAB — PHOSPHORUS: Phosphorus: 3.6 mg/dL (ref 2.5–4.6)

## 2023-10-31 MED ORDER — NYSTATIN 100000 UNIT/GM EX POWD
Freq: Three times a day (TID) | CUTANEOUS | Status: DC
  Filled 2023-10-31: qty 15

## 2023-10-31 MED ORDER — ARFORMOTEROL TARTRATE 15 MCG/2ML IN NEBU
15.0000 ug | INHALATION_SOLUTION | Freq: Two times a day (BID) | RESPIRATORY_TRACT | Status: DC
  Administered 2023-10-31 – 2023-11-01 (×3): 15 ug via RESPIRATORY_TRACT
  Filled 2023-10-31 (×4): qty 2

## 2023-10-31 MED ORDER — ISOSORBIDE MONONITRATE ER 30 MG PO TB24
30.0000 mg | ORAL_TABLET | Freq: Every day | ORAL | Status: DC
  Administered 2023-10-31 – 2023-11-01 (×2): 30 mg via ORAL
  Filled 2023-10-31 (×2): qty 1

## 2023-10-31 MED ORDER — ONDANSETRON HCL 4 MG PO TABS: 4.0000 mg | ORAL_TABLET | Freq: Four times a day (QID) | ORAL | Status: DC | PRN

## 2023-10-31 MED ORDER — NITROGLYCERIN 0.4 MG SL SUBL
0.4000 mg | SUBLINGUAL_TABLET | SUBLINGUAL | Status: DC | PRN
Start: 2023-10-31 — End: 2023-11-02

## 2023-10-31 MED ORDER — PHENOL 1.4 % MT LIQD
1.0000 | OROMUCOSAL | Status: DC | PRN
  Administered 2023-10-31: 1 via OROMUCOSAL
  Filled 2023-10-31: qty 177

## 2023-10-31 MED ORDER — CARVEDILOL 3.125 MG PO TABS
6.2500 mg | ORAL_TABLET | Freq: Two times a day (BID) | ORAL | Status: DC
  Administered 2023-10-31 – 2023-11-01 (×4): 6.25 mg via ORAL
  Filled 2023-10-31 (×4): qty 2

## 2023-10-31 MED ORDER — ALBUTEROL SULFATE (2.5 MG/3ML) 0.083% IN NEBU
2.5000 mg | INHALATION_SOLUTION | Freq: Four times a day (QID) | RESPIRATORY_TRACT | Status: AC
  Administered 2023-10-31 (×2): 2.5 mg via RESPIRATORY_TRACT
  Filled 2023-10-31 (×2): qty 3

## 2023-10-31 MED ORDER — INSULIN ASPART 100 UNIT/ML IJ SOLN
0.0000 [IU] | Freq: Three times a day (TID) | INTRAMUSCULAR | Status: DC
  Administered 2023-10-31 – 2023-11-01 (×2): 1 [IU] via SUBCUTANEOUS

## 2023-10-31 MED ORDER — HEPARIN (PORCINE) 25000 UT/250ML-% IV SOLN
1700.0000 [IU]/h | INTRAVENOUS | Status: DC
  Administered 2023-10-31: 1100 [IU]/h via INTRAVENOUS
  Filled 2023-10-31 (×3): qty 250

## 2023-10-31 MED ORDER — AMLODIPINE BESYLATE 5 MG PO TABS
2.5000 mg | ORAL_TABLET | Freq: Every day | ORAL | Status: DC
  Administered 2023-10-31 – 2023-11-01 (×2): 2.5 mg via ORAL
  Filled 2023-10-31 (×2): qty 1

## 2023-10-31 MED ORDER — ACETAMINOPHEN 325 MG PO TABS
650.0000 mg | ORAL_TABLET | Freq: Four times a day (QID) | ORAL | Status: DC | PRN
  Administered 2023-10-31: 650 mg via ORAL
  Filled 2023-10-31: qty 2

## 2023-10-31 MED ORDER — ACETAMINOPHEN 650 MG RE SUPP: 650.0000 mg | Freq: Four times a day (QID) | RECTAL | Status: DC | PRN

## 2023-10-31 MED ORDER — FUROSEMIDE 20 MG PO TABS
20.0000 mg | ORAL_TABLET | Freq: Every day | ORAL | Status: DC
  Administered 2023-10-31 – 2023-11-01 (×2): 20 mg via ORAL
  Filled 2023-10-31 (×2): qty 1

## 2023-10-31 MED ORDER — HEPARIN BOLUS VIA INFUSION
3000.0000 [IU] | Freq: Once | INTRAVENOUS | Status: AC
  Administered 2023-10-31: 3000 [IU] via INTRAVENOUS

## 2023-10-31 MED ORDER — PANTOPRAZOLE SODIUM 40 MG PO TBEC
40.0000 mg | DELAYED_RELEASE_TABLET | Freq: Every day | ORAL | Status: DC
Start: 2023-10-31 — End: 2023-11-02
  Administered 2023-10-31 – 2023-11-01 (×2): 40 mg via ORAL
  Filled 2023-10-31 (×2): qty 1

## 2023-10-31 MED ORDER — PANTOPRAZOLE SODIUM 20 MG PO TBEC: 20.0000 mg | DELAYED_RELEASE_TABLET | Freq: Every day | ORAL | Status: DC

## 2023-10-31 MED ORDER — UMECLIDINIUM BROMIDE 62.5 MCG/ACT IN AEPB
1.0000 | INHALATION_SPRAY | Freq: Every day | RESPIRATORY_TRACT | Status: DC
  Administered 2023-10-31 – 2023-11-01 (×2): 1 via RESPIRATORY_TRACT
  Filled 2023-10-31: qty 7

## 2023-10-31 MED ORDER — ONDANSETRON HCL 4 MG/2ML IJ SOLN: 4.0000 mg | Freq: Four times a day (QID) | INTRAMUSCULAR | Status: DC | PRN

## 2023-10-31 NOTE — H&P (Signed)
 History and Physical    Patient: Stephanie [name] FMW:[id number] DOB: September 08, 1935 DOA: 10/31/2023 DOS: the patient was seen and examined on 10/31/2023 PCP: [facility]  Patient coming from: Home  Chief Complaint:  Chief Complaint  Patient presents with   Chest Pain   HPI: Stephanie [name] is a 88 y.o. female with medical history significant of hypertension, hyperlipidemia, T2DM, asthma/COPD, paroxysmal atrial fibrillation, CKD stage IIIB, aortic stenosis status post TAVR 2022, lung adenocarcinoma status post SBRT 02/2022, GI bleed presenting with chest pain that woke her up from sleep midnight.  She complained of nonradiating, nonreproducible midsternal chest pain described as sharp pain on chest.  EMS was activated and she was given aspirin  325 mg orally x 1 and sublingual nitroglycerin  en route with partial relief of the chest pain on arrival to the ED shortness of breath, nausea, vomiting, diaphoresis, tobacco use or family history of CAD.  Patient was recently admitted from 9/6 to 9/8 due to acute respiratory failure with hypoxemia and hypercarbia secondary to COPD exacerbation and fluid overload in which she was initially presented with supplemental oxygen  at 2 LPM but was weaned off this prior to discharge, COPD was provided with breathing treatment and steroids and was discharged home with prednisone  taper  ED Course:  In the emergency department, BP was 126/54, other vital signs were within normal range.  Workup in the ED showed WBC 11.7, hemoglobin 10.6, hematocrit 33.6, MCV 88.4, platelets 181.  BMP was normal except for BUN/creatinine 42/1.92 (baseline creatinine about 1.5-1.6) eGFR 25, troponin 53. Patient was started on heparin  drip. Cardiologist on-call (Dr. Cesario) was consulted and recommended admitting patient to [facility] with plan for cardiology team to consult on patient on arrival to [facility] was asked to admit patient.  Review of Systems: Review  of systems as noted in the HPI. All other systems reviewed and are negative.   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 Wonda and Dr. Lucas.   Severe aortic stenosis    Symptomatic anemia    Presumed GI bleed January 2021 - she declined GI work-up   Type 2 diabetes mellitus [facility])    Past Surgical History:  Procedure Laterality Date   APPENDECTOMY     BACK SURGERY     BRONCHIAL BIOPSY  03/02/2022   Procedure: BRONCHIAL BIOPSIES;  Surgeon: Brenna Adine LITTIE, DO;  Location: [facility] ENDOSCOPY;  Service: Pulmonary;;   BRONCHIAL BRUSHINGS  03/02/2022   Procedure: BRONCHIAL BRUSHINGS;  Surgeon: Brenna Adine LITTIE, DO;  Location: [facility] ENDOSCOPY;  Service: Pulmonary;;   BRONCHIAL NEEDLE ASPIRATION BIOPSY  03/02/2022   Procedure: BRONCHIAL NEEDLE ASPIRATION BIOPSIES;  Surgeon: Brenna Adine LITTIE, DO;  Location: [facility] ENDOSCOPY;  Service: Pulmonary;;   CYST REMOVAL HAND     Right, hospital in [place]   FIDUCIAL MARKER PLACEMENT  03/02/2022   Procedure: FIDUCIAL MARKER PLACEMENT;  Surgeon: Brenna Adine LITTIE, DO;  Location: [facility] ENDOSCOPY;  Service: Pulmonary;;   INTRAOPERATIVE TRANSTHORACIC ECHOCARDIOGRAM Left 07/01/2020   Procedure: INTRAOPERATIVE TRANSTHORACIC ECHOCARDIOGRAM;  Surgeon: Wonda Sharper, MD;  Location: [facility] OR;  Service: Open Heart Surgery;  Laterality: Left;   KNEE ARTHROSCOPY WITH MEDIAL MENISECTOMY Right 02/28/2012   Procedure: KNEE ARTHROSCOPY WITH MEDIAL MENISECTOMY;  Surgeon: Taft FORBES Minerva, MD;  Location: [facility] ORS;  Service: Orthopedics;  Laterality: Right;   REPAIR KNEE LIGAMENT     RIGHT/LEFT HEART CATH AND CORONARY ANGIOGRAPHY N/A 04/10/2020    Procedure: RIGHT/LEFT HEART CATH AND CORONARY ANGIOGRAPHY;  Surgeon: Dann Candyce RAMAN, MD;  Location: [facility] INVASIVE CV LAB;  Service: Cardiovascular;  Laterality: N/A;   TRANSCATHETER AORTIC VALVE REPLACEMENT, TRANSFEMORAL N/A 07/01/2020   Procedure: TRANSCATHETER AORTIC VALVE REPLACEMENT, TRANSFEMORAL;  Surgeon: Wonda Sharper, MD;  Location: [facility] OR;  Service: Open Heart Surgery;  Laterality: N/A;   ULTRASOUND GUIDANCE FOR VASCULAR ACCESS Bilateral 07/01/2020   Procedure: ULTRASOUND GUIDANCE FOR VASCULAR ACCESS;  Surgeon: Wonda Sharper, MD;  Location: [facility] OR;  Service: Open Heart Surgery;  Laterality: Bilateral;    Social History:  reports that she has quit smoking. Her smoking use included cigarettes. She has never been exposed to tobacco smoke. She has never used smokeless tobacco. She reports that she does not drink alcohol and does not use drugs.   Allergies  Allergen Reactions   Amoxicillin Swelling    Tongue swelling    Nitrofurantoin Nausea And Vomiting   Penicillins Swelling   Sulfa Antibiotics Swelling    Lip swelling    Family History  Problem Relation Age of Onset   Lung disease Other    Cancer Other    Arthritis Other    Asthma Other    Diabetes Other    Diabetes Mother    Hypertension Mother    Diverticulitis Mother    Diabetes Father    Hypertension Sister    Diabetes Brother    Colon cancer Neg Hx      Prior to Admission medications   Medication Sig Start Date End Date Taking? Authorizing Provider  albuterol  (PROVENTIL ) (2.5 MG/3ML) 0.083% nebulizer solution Take 3 mLs (2.5 mg total) by nebulization every 2 (two) hours as needed for wheezing or shortness of breath. 08/12/23   Pearlean, Courage, MD  amLODipine  (NORVASC ) 2.5 MG tablet Take 1 tablet (2.5 mg total) by mouth daily. 09/26/23   Evonnie Lenis, MD  carvedilol  (COREG ) 6.25 MG tablet Take 6.25 mg by mouth 2 (two) times daily with a meal.    [provider]  cetirizine (ZYRTEC) 10 MG tablet Take 10 mg  by mouth at bedtime.    [provider]  cinacalcet  (SENSIPAR ) 30 MG tablet Take 30 mg by mouth daily. 03/31/23   [provider]  FEROSUL 325 (65 Fe) MG tablet Take 325 mg by mouth daily.    [provider]  furosemide  (LASIX ) 20 MG tablet Take 1 tablet (20 mg total) by mouth daily for 3 days, THEN 1 tablet (20 mg total) daily as needed (leg edema, shortness of breath, or weight gain.). Patient taking differently: Take 1 -2 tablet (20 mg -40mg  total) by mouth daily for 3 days, THEN 1 tablet (20 mg total) daily as needed (leg edema, shortness of breath, or weight gain.). 08/30/23 12/01/23  Miriam Norris, NP  gabapentin  (NEURONTIN ) 100 MG capsule Take 100-200 mg by mouth at bedtime. 10/02/19   [provider]  GNP VITAMIN D3 EXTRA STRENGTH 25 MCG (1000 UT) tablet Take 1,000 Units by mouth daily. 03/31/23   [provider]  hydrALAZINE  (APRESOLINE ) 50 MG tablet Take 1 tablet (50 mg total) by mouth 3 (three) times daily. 08/12/23  Pearlean Manus, MD  isosorbide  mononitrate (IMDUR ) 30 MG 24 hr tablet Take 1 tablet (30 mg total) by mouth daily. 08/12/23 08/11/24  Pearlean Manus, MD  LANTUS SOLOSTAR 100 UNIT/ML Solostar Pen Inject 40 Units into the skin at bedtime.    [provider]  pantoprazole (PROTONIX) 20 MG tablet Take 1 tablet (20 mg total) by mouth daily. 10/18/23 01/16/24  Melvenia Motto, MD  predniSONE  (DELTASONE ) 10 MG tablet Take 6 tablets (60 mg total) by mouth daily with breakfast. And decrease by 1 tablet daily 09/27/23   Tat, Alm, MD  sucralfate (CARAFATE) 1 GM/10ML suspension Take 10 mLs (1 g total) by mouth with breakfast, with lunch, and with evening meal. 10/18/23   Melvenia Motto, MD  Tiotropium Bromide-Olodaterol (STIOLTO RESPIMAT ) 2.5-2.5 MCG/ACT AERS Inhale 2 puffs into the lungs daily. 09/21/23   Shelah Lamar RAMAN, MD    Physical Exam: BP 135/69   Pulse 73   Temp 99.1 F (37.3 C) (Oral)   Resp (!) 22   Ht 5' 5 (1.651 m)   Wt 91.6  kg   SpO2 93%   BMI 33.60 kg/m   General: 88 y.o. year-old female well developed well nourished in no acute distress.  Alert and oriented x3. HEENT: NCAT, EOMI Neck: Supple, trachea medial Cardiovascular: Regular rate and rhythm with no rubs or gallops.  No thyromegaly or JVD noted.  +2 lower extremity edema bilaterally. 2/4 pulses in all 4 extremities. Respiratory: Clear to auscultation with no wheezes or rales. Good inspiratory effort. Abdomen: Soft, nontender nondistended with normal bowel sounds x4 quadrants. Muskuloskeletal: No cyanosis, clubbing or edema noted bilaterally Neuro: CN II-XII intact, strength 5/5 x 4, sensation, reflexes intact Skin: No ulcerative lesions noted or rashes Psychiatry: Judgement and insight appear normal. Mood is appropriate for condition and setting          Labs on Admission:  Basic Metabolic Panel: Recent Labs  Lab 10/31/23 0334  NA 138  K 4.3  CL 100  CO2 29  GLUCOSE 96  BUN 42*  CREATININE 1.92*  CALCIUM  8.1*   Liver Function Tests: No results for input(s): AST, ALT, ALKPHOS, BILITOT, PROT, ALBUMIN in the last 168 hours. No results for input(s): LIPASE, AMYLASE in the last 168 hours. No results for input(s): AMMONIA in the last 168 hours. CBC: Recent Labs  Lab 10/31/23 0334  WBC 11.7*  NEUTROABS 8.9*  HGB 10.6*  HCT 33.6*  MCV 88.4  PLT 181   Cardiac Enzymes: No results for input(s): CKTOTAL, CKMB, CKMBINDEX, TROPONINI in the last 168 hours.  BNP (last 3 results) Recent Labs    08/12/23 0907 09/03/23 1046 09/24/23 0715  BNP 363.0* 581.0* 724.0*    ProBNP (last 3 results) No results for input(s): PROBNP in the last 8760 hours.  CBG: No results for input(s): GLUCAP in the last 168 hours.  Radiological Exams on Admission: DG Chest Portable 1 View Result Date: 10/31/2023 EXAM: 1 VIEW XRAY OF THE CHEST 10/31/2023 04:05:20 AM COMPARISON: Chest CT 09/24/2023 and earlier. CLINICAL HISTORY:  88 year old female with substernal chest pain starting at midnight, resolved with nitroglycerin  en route. Denies N/V/SHOB. FINDINGS: LUNGS AND PLEURA: Improved lung volumes from last month. Stable chronic probable post bronchoscopy biopsy clip in the right lower lobe. Small pleural effusions last month appear resolved. No focal pulmonary opacity. No pulmonary edema. No pneumothorax. HEART AND MEDIASTINUM: Chronic cardiomegaly and TAVR. Stable mediastinal contour. Chronic calcified aortic atherosclerosis. BONES AND SOFT TISSUES: No acute osseous abnormality. IMPRESSION: 1. No  acute cardiopulmonary abnormality. Electronically signed by: Helayne Hurst MD 10/31/2023 04:32 AM EDT RP Workstation: [id number]    EKG: I independently viewed the EKG done and my findings are as followed: Normal sinus rhythm at a rate of 74 bpm with PACs  Assessment/Plan Present on Admission:  Chest pain  Acute kidney injury superimposed on stage 4 chronic kidney disease (HCC)  COPD (chronic obstructive pulmonary disease) (HCC)  Essential hypertension, benign  Principal Problem:   Chest pain Active Problems:   Essential hypertension, benign   Type 2 diabetes mellitus with chronic kidney disease, with long-term current use of insulin  (HCC)   COPD (chronic obstructive pulmonary disease) (HCC)   Acute kidney injury superimposed on stage 4 chronic kidney disease (HCC)   Obesity, Class I, BMI 30-34.9   Chronic heart failure with preserved ejection fraction (HFpEF) (HCC)   Paroxysmal atrial fibrillation (HCC)  Chest pain r/o NSTEMI Continue telemetry  Troponins  - 53 EKG showed normal sinus rhythm at a rate of 74 bpm with PACs Nitroglycerin  sublingual was given with improvement in chest pain Patient was started on IV heparin  drip in the ED Cardiologist on-call (Dr. Cesario) was consulted and recommended admitting [facility] to [facility] so that cardiology team can consult on him on arrival to [facility].  Aspirin  324 mg was given by  EMS team.  Continue nitroglycerin  prn  Acute kidney injury on CKD 4 BUN/creatinine 42/1.92 (baseline creatinine about 1.5-1.6) Renally adjust medications, avoid nephrotoxic agents/dehydration/hypotension  Obesity class I (BMI 33.60) Diet and lifestyle medication  COPD not in acute exacerbation Continue albuterol  inhaler, Stiolto Respimat   Chronic HFpEF Continue total input/output, daily weights and fluid restriction Echocardiogram done on 09/23/2022 showed EF 55 to 60%, no WMA, grade 1 DD, severe LVH, normal RVF, stable TAVR Echocardiogram done on 09/25/23 Echo showed EF 60-65%, mod LVH, normal TAVR fxn  Paroxysmal atrial fibrillation Patient is not on anticoagulation secondary to history of GI bleed Continue Coreg    Essential hypertension Continue amlodipine , Coreg , hydralazine , Imdur    Diabetes mellitus type 2 09/26/23 hemoglobin A1c 6.5 Continue ISS and hypoglycemia protocol   DVT prophylaxis: Heparin  drip  Code Status: Full code  Family Communication: None at bedside  Consults: Cardiology by [facility] EDP   Severity of Illness: The appropriate patient status for this patient is OBSERVATION. Observation status is judged to be reasonable and necessary in order to provide the required intensity of service to ensure the patient's safety. The patient's presenting symptoms, physical exam findings, and initial radiographic and laboratory data in the context of their medical condition is felt to place them at decreased risk for further clinical deterioration. Furthermore, it is anticipated that the patient will be medically stable for discharge from the hospital within 2 midnights of admission.   Author: Posey Maier, DO 10/31/2023 6:34 AM  For on call review [email or web address].

## 2023-10-31 NOTE — ED Notes (Signed)
 ED Provider at bedside.

## 2023-10-31 NOTE — Progress Notes (Addendum)
 PHARMACY - ANTICOAGULATION CONSULT NOTE  Pharmacy Consult for heparin  Indication: chest pain/ACS, atrial fibrillation  Allergies  Allergen Reactions   Amoxicillin Swelling    Tongue swelling    Nitrofurantoin Nausea And Vomiting   Penicillins Swelling   Sulfa Antibiotics Swelling    Lip swelling    Patient Measurements: Height: 5' 5 (165.1 cm) Weight: 91.6 kg (201 lb 15.1 oz) IBW/kg (Calculated) : 57 HEPARIN  DW (KG): 77.4  Vital Signs: Temp: 99.1 F (37.3 C) (10/13 0328) Temp Source: Oral (10/13 0328) BP: 145/89 (10/13 0415) Pulse Rate: 66 (10/13 0415)  Labs: Recent Labs    10/31/23 0334  HGB 10.6*  HCT 33.6*  PLT 181  CREATININE 1.92*    Estimated Creatinine Clearance: 23.9 mL/min (A) (by C-G formula based on SCr of 1.92 mg/dL (H)).   Medical History: Past Medical History:  Diagnosis Date   Arthritis    Asthma    CKD (chronic kidney disease)    COPD (chronic obstructive pulmonary disease) (HCC)    Essential hypertension    Heart murmur    Mixed hyperlipidemia    PAF (paroxysmal atrial fibrillation) (HCC)    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 Wonda and Dr. Lucas.   Severe aortic stenosis    Symptomatic anemia    Presumed GI bleed January 2021 - she declined GI work-up   Type 2 diabetes mellitus (HCC)    Assessment: 50 yoF presented with chest pain starting at midnight. Pharmacy consulted to dose heparin  for ACS. PMH includes pAF (not on anticoagulation given bleeding history per notes), severe AS s/p TAVR 2022, HLD, HTN  -Trops 53 -Hgb 10.6 (bl ~11-12), plts 181 -No PTA anticoagulation; seems unclear of reasoning per past cards notes, but taken off back in 03/2022  Goal of Therapy:  Heparin  level 0.3-0.7 units/ml Monitor platelets by anticoagulation protocol: Yes   Plan:  Give 3000 units bolus x 1 given GI bleeding history/low Hgb  Start heparin  infusion  at 1100 units/hr Check anti-Xa level in 8 hours and daily while on heparin  Continue to monitor H&H and platelets F/u cards consult F/u if to restart oral AC for atrial fibrillation  Lynwood Poplar, PharmD, BCPS Clinical Pharmacist 10/31/2023 4:40 AM

## 2023-10-31 NOTE — Progress Notes (Signed)
 PHARMACY - ANTICOAGULATION CONSULT NOTE  Pharmacy Consult for heparin  Indication: chest pain/ACS  Labs: Recent Labs    10/31/23 0334 10/31/23 1341 10/31/23 2206  HGB 10.6*  --   --   HCT 33.6*  --   --   PLT 181  --   --   HEPARINUNFRC  --  0.31 0.15*  CREATININE 1.92*  --   --    Assessment: 88yo female subtherapeutic on heparin  after one level at low end of goal, likely d/t bolus; no infusion issues or signs of bleeding per RN.  Goal of Therapy:  Heparin  level 0.3-0.7 units/ml   Plan:  Increase heparin  infusion by 3 units/kg/hr to 1400 units/hr. Check level in 8 hours.   Marvetta Dauphin, PharmD, BCPS 10/31/2023 11:11 PM

## 2023-10-31 NOTE — Progress Notes (Signed)
*  PRELIMINARY RESULTS* Echocardiogram Limited 2-D Echocardiogram has been performed.  Stephanie [name] 10/31/2023, 2:32 PM

## 2023-10-31 NOTE — Progress Notes (Signed)
 Transition of Care Department [facility]) has reviewed patient and no other TOC needs have been identified at this time. We will continue to monitor patient advancement through interdisciplinary progression rounds. If new patient transition needs arise, please place a TOC consult.   10/31/23 0946  TOC Brief Assessment  Insurance and Status Reviewed  Patient has primary care physician Yes  Home environment has been reviewed Lives with granddaughter.  Prior level of function: Family assists.  Prior/Current Home Services No current home services  Social Drivers of Health Review SDOH reviewed no interventions necessary  Readmission risk has been reviewed Yes  Transition of care needs no transition of care needs at this time

## 2023-10-31 NOTE — Consult Note (Addendum)
 Cardiology Consultation   Patient ID: [name] MRN: [id number]; DOB: 26-Jul-1935  Admit date: 10/31/2023 Date of Consult: 10/31/2023  PCP:  [facility]   [facility] Providers Cardiologist:  Jayson Sierras, MD      Patient Profile: Stephanie [name] is a 88 y.o. female with a hx of severe aortic stenosis, s/p TAVR in 2022, nonobstructive CAD (cath 03/2020), PAF, COPD, hypertension, mixed hyperlipidemia, chronic kidney disease, hx of lung cancer (receiving tx), and presumed GI bleed in January 21, type 2 diabetes who is being seen 10/31/2023 for the evaluation of chest pain r/o NSTEMI at the request of Dr, Vicci.  History of Present Illness: Stephanie [name] was last seen in heart care 08/30/2023 by Almarie Crate, NP for follow-up.  At that time, noted several ED visits in the interim but overall doing well at visit.  Reported atypical pain along right side of chest, weight gain, two-pillow orthopnea and peripheral edema.  Reviewed CT angio chest which showed artifact versus small nonocclusive chronic clot in right lower pulmonary artery Tarsha Blando.  Noted interval increase in size of right lower lobe mass, a small acute PE was less likely, and trace right pleural effusion.  Patient appeared volume overloaded on exam.  Started Lasix  20 mg X 3 days then as needed.  Ordered proBNP, B med and mag in 1 to 2 weeks.  Continued on Coreg  6.25 mg twice daily, hydralazine  50 mg 3 times daily, Imdur  30 mg daily, lisinopril  2.5 mg daily, Amlodipine  2.5 mg.   Patient was recently admitted from 9/6 to 9/8 due to acute respiratory failure with hypoxemia and hypercarbia secondary to COPD exacerbation and fluid overload.  She was treated with supplemental O2 at 2 LPM but was weaned off prior to discharge.  Also treated with breathing treatment and steroids then discharged home with prednisone  taper.  Presented to [facility] ED today for chest pain.  She was transported by EMS who  treated with ASA 325 mg X1 and NTG.  Per chart review, on arrival to the ED they noted SOB, N/B, diaphoresis, tobacco use and family history of CAD. EKG: NSR, HR 74, PAC, nonspecific T wave changes, chronic Q waves in anterior leads (no significant changes).  K 4.3, Mg 1.9, CR 1.92 (baseline Cr 1.5-1.6), TN 53 > 53, WBC 11.7 with neutrophils 8.9 and monocytes 1.6, Hgb 10.6.  CXR with no acute findings. Cardiologist on call, Dr. Cesario, was consulted and recommended admitting patient to [facility]. Treated with IV heparin .  On interview, patient reported chest pain started Sunday night while asleep described as tightness, 10/10, located mid sternal w/o radiation that was constant for 15 mins until resolved with EMS NTG.  No recurrent CP since then.  No previous history of CP.  Earlier in the day Sunday patient went to church without any issues.  Denies any excessive lifting. No reproducible pain on exam.  Denies any N/V, SOB, dizziness, diaphoresis, palpitations, lower extremity edema 80, syncope.  Patient is able to clean the house (dusting, sweeping) and walk around grocery store without any issues.  Notes compliance with medications.  She stays at home with niece and is able to care for herself.  Denies any tobacco/EtOH/drug use.   Past Medical History:  Diagnosis Date   Arthritis    Asthma    CKD (chronic kidney disease)    COPD (chronic obstructive pulmonary disease) (HCC)    Essential hypertension    Heart murmur    Mixed  hyperlipidemia    PAF (paroxysmal atrial fibrillation) (HCC)    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 Wonda and Dr. Lucas.   Severe aortic stenosis    Symptomatic anemia    Presumed GI bleed January 2021 - she declined GI work-up   Type 2 diabetes mellitus [facility])     Past Surgical History:  Procedure Laterality Date   APPENDECTOMY     BACK SURGERY     BRONCHIAL BIOPSY  03/02/2022    Procedure: BRONCHIAL BIOPSIES;  Surgeon: Brenna Adine CROME, DO;  Location: [facility] ENDOS[facility];  Service: Pulmonary;;   BRONCHIAL BRUSHINGS  03/02/2022   Procedure: BRONCHIAL BRUSHINGS;  Surgeon: Brenna Adine CROME, DO;  Location: [facility] ENDOS[facility];  Service: Pulmonary;;   BRONCHIAL NEEDLE ASPIRATION BIOPSY  03/02/2022   Procedure: BRONCHIAL NEEDLE ASPIRATION BIOPSIES;  Surgeon: Brenna Adine CROME, DO;  Location: [facility] ENDOSCOPY;  Service: Pulmonary;;   CYST REMOVAL HAND     Right, hospital in [place]   FIDUCIAL MARKER PLACEMENT  03/02/2022   Procedure: FIDUCIAL MARKER PLACEMENT;  Surgeon: Brenna Adine CROME, DO;  Location: [facility] ENDOSCOPY;  Service: Pulmonary;;   INTRAOPERATIVE TRANSTHORACIC ECHOCARDIOGRAM Left 07/01/2020   Procedure: INTRAOPERATIVE TRANSTHORACIC ECHOCARDIOGRAM;  Surgeon: Wonda Sharper, MD;  Location: [facility] OR;  Service: Open Heart Surgery;  Laterality: Left;   KNEE ARTHROSCOPY WITH MEDIAL MENISECTOMY Right 02/28/2012   Procedure: KNEE ARTHROSCOPY WITH MEDIAL MENISECTOMY;  Surgeon: Taft FORBES Minerva, MD;  Location: [facility] ORS;  Service: Orthopedics;  Laterality: Right;   REPAIR KNEE LIGAMENT     RIGHT/LEFT HEART CATH AND CORONARY ANGIOGRAPHY N/A 04/10/2020   Procedure: RIGHT/LEFT HEART CATH AND CORONARY ANGIOGRAPHY;  Surgeon: Dann Candyce RAMAN, MD;  Location: [facility] INVASIVE CV LAB;  Service: Cardiovascular;  Laterality: N/A;   TRANSCATHETER AORTIC VALVE REPLACEMENT, TRANSFEMORAL N/A 07/01/2020   Procedure: TRANSCATHETER AORTIC VALVE REPLACEMENT, TRANSFEMORAL;  Surgeon: Wonda Sharper, MD;  Location: [facility] OR;  Service: Open Heart Surgery;  Laterality: N/A;   ULTRASOUND GUIDANCE FOR VASCULAR ACCESS Bilateral 07/01/2020   Procedure: ULTRASOUND GUIDANCE FOR VASCULAR ACCESS;  Surgeon: Wonda Sharper, MD;  Location: [facility] OR;  Service: Open Heart Surgery;  Laterality: Bilateral;     Home Medications:  Prior to Admission medications   Medication Sig Start Date End Date Taking? Authorizing Provider  albuterol  (PROVENTIL )  (2.5 MG/3ML) 0.083% nebulizer solution Take 3 mLs (2.5 mg total) by nebulization every 2 (two) hours as needed for wheezing or shortness of breath. 08/12/23   Pearlean, Courage, MD  amLODipine  (NORVASC ) 2.5 MG tablet Take 1 tablet (2.5 mg total) by mouth daily. 09/26/23   Evonnie Lenis, MD  carvedilol  (COREG ) 6.25 MG tablet Take 6.25 mg by mouth 2 (two) times daily with a meal.    [provider]  cetirizine (ZYRTEC) 10 MG tablet Take 10 mg by mouth at bedtime.    [provider]  cinacalcet  (SENSIPAR ) 30 MG tablet Take 30 mg by mouth daily. 03/31/23   [provider]  FEROSUL 325 (65 Fe) MG tablet Take 325 mg by mouth daily.    [provider]  furosemide  (LASIX ) 20 MG tablet Take 1 tablet (20 mg total) by mouth daily for 3 days, THEN 1 tablet (20 mg total) daily as needed (leg edema, shortness of breath, or weight gain.). Patient taking differently: Take 1 -2 tablet (20 mg -40mg  total) by mouth daily for 3 days, THEN 1 tablet (20 mg total) daily as  needed (leg edema, shortness of breath, or weight gain.). 08/30/23 12/01/23  Miriam Norris, NP  gabapentin  (NEURONTIN ) 100 MG capsule Take 100-200 mg by mouth at bedtime. 10/02/19   [provider]  GNP VITAMIN D3 EXTRA STRENGTH 25 MCG (1000 UT) tablet Take 1,000 Units by mouth daily. 03/31/23   [provider]  hydrALAZINE  (APRESOLINE ) 50 MG tablet Take 1 tablet (50 mg total) by mouth 3 (three) times daily. 08/12/23   Pearlean Manus, MD  isosorbide  mononitrate (IMDUR ) 30 MG 24 hr tablet Take 1 tablet (30 mg total) by mouth daily. 08/12/23 08/11/24  Pearlean Manus, MD  LANTUS SOLOSTAR 100 UNIT/ML Solostar Pen Inject 40 Units into the skin at bedtime.    [provider]  pantoprazole (PROTONIX) 20 MG tablet Take 1 tablet (20 mg total) by mouth daily. 10/18/23 01/16/24  Melvenia Motto, MD  predniSONE  (DELTASONE ) 10 MG tablet Take 6 tablets (60 mg total) by mouth daily with breakfast. And decrease by 1 tablet  daily 09/27/23   Tat, Alm, MD  sucralfate (CARAFATE) 1 GM/10ML suspension Take 10 mLs (1 g total) by mouth with breakfast, with lunch, and with evening meal. 10/18/23   Melvenia Motto, MD  Tiotropium Bromide-Olodaterol (STIOLTO RESPIMAT ) 2.5-2.5 MCG/ACT AERS Inhale 2 puffs into the lungs daily. 09/21/23   Shelah Lamar RAMAN, MD    Scheduled Meds:  insulin  aspart  0-9 Units Subcutaneous TID WC   Continuous Infusions:  heparin  1,100 Units/hr (10/31/23 0454)   PRN Meds: acetaminophen  **OR** acetaminophen , nitroGLYCERIN , ondansetron  **OR** ondansetron  (ZOFRAN ) IV  Allergies:    Allergies  Allergen Reactions   Amoxicillin Swelling    Tongue swelling    Nitrofurantoin Nausea And Vomiting   Penicillins Swelling   Sulfa Antibiotics Swelling    Lip swelling    Social History:   Social History   Socioeconomic History   Marital status: Divorced    Spouse name: Not on file   Number of children: 3   Years of education: Not on file   Highest education level: Not on file  Occupational History   Occupation: Retired-cook   Tobacco Use   Smoking status: Former    Current packs/day: 1.00    Types: Cigarettes    Passive exposure: Never   Smokeless tobacco: Never   Tobacco comments:    Quit over 40 years ago.  Does not know how many years she smoked or start/stop date  Vaping Use   Vaping status: Never Used  Substance and Sexual Activity   Alcohol use: No   Drug use: No   Sexual activity: Never  Other Topics Concern   Not on file  Social History Narrative   ** Merged History Encounter **       Family History:   Family History  Problem Relation Age of Onset   Lung disease Other    Cancer Other    Arthritis Other    Asthma Other    Diabetes Other    Diabetes Mother    Hypertension Mother    Diverticulitis Mother    Diabetes Father    Hypertension Sister    Diabetes Brother    Colon cancer Neg Hx      ROS:  Please see the history of present illness.  All other ROS reviewed  and negative.     Physical Exam/Data: Vitals:   10/31/23 0505 10/31/23 0530 10/31/23 0600 10/31/23 0630  BP: (!) 147/98 135/69 (!) 139/91 (!) 143/48  Pulse: 64 73 67 66  Resp: (!) 22 (!)  22 20 (!) 28  Temp:      TempSrc:      SpO2: 96% 93% 97% 96%  Weight:      Height:       No intake or output data in the 24 hours ending 10/31/23 0841    10/31/2023    4:15 AM 10/18/2023    3:22 PM 09/26/2023    3:24 AM  Last 3 Weights  Weight (lbs) 201 lb 15.1 oz 201 lb 15.1 oz 201 lb 15.1 oz  Weight (kg) 91.6 kg 91.6 kg 91.6 kg     Body mass index is 33.6 kg/m.  General:  Well nourished, well developed, in no acute distress; Laying in bed eating breakfast HEENT: normal Neck: no JVD Vascular: No carotid bruits; Distal pulses 2+ bilaterally Cardiac:  normal S1, S2; RRR; 2/6 systolic murmur Lungs:  clear to auscultation bilaterally, no wheezing, rhonchi or rales  Abd: soft, nontender, no hepatomegaly  Ext: no edema Musculoskeletal:  No deformities, BUE and BLE strength normal and equal Skin: warm and dry  Neuro:  CNs 2-12 intact, no focal abnormalities noted Psych:  Normal affect   EKG:  The EKG was personally reviewed and demonstrates:  NSR, HR 74, nonspecific T wave changes, chronic Q waves in anterior leads (no significant changes).  Telemetry:  Telemetry was personally reviewed and demonstrates:  NSR, HR 60s to 70s, PACs,  PVCs  Relevant CV Studies: Cath R/L  04/10/2020 Mid Cx lesion is 50% stenosed. LV end diastolic pressure is mildly elevated. There is mild aortic valve stenosis. Calculated valve area 1.56 cm2. Ao sat 98%, PA sat 73%, PA pressure 39/5, mean PA pressure 19 mm Hg; mean PCWP 14 mm Hg; CO 6.5 L/min; CI 3.3. Small radial artery. Ulnar access obtained. Vasospasm noted.   Nonobstructive CAD.  Mild aortic stenosis.     Will hydrate gently post cath given renal insufficiency.  Ultimately, may need diuresis.     Cardiac CTA 04/2020 IMPRESSION: 1. Severe Aortic stenosis.  Findings pertinent to TAVR procedure are detailed above.   2.  Cardiac silhouette is rotated.   3. Patient's total coronary artery calcium  score is 973, which is 89th percentile for subjects of the same age, gender, and race based populations.  ZIO monitor reviewed 10/2022.  9 days, 17 hours analyzed.   Predominant rhythm is sinus with prolonged PR interval.  Heart rate ranged from 41 bpm up to 101 bpm and average heart rate 67 bpm. Frequent PACs were noted representing 13.6% total beats.  There were otherwise occasional atrial couplets and rare atrial triplets. There were rare PVCs including ventricular couplets representing less than 1% total beats.  Also limited ventricular trigeminy. There were 6 episodes of NSVT, the longest of which was 8 beats. Multiple (116) episodes of PSVT were noted.  These were generally very brief, the longest of which was only 14 beats.  There were no sustained arrhythmias. No pauses or high degree heart block.  ECHO IMPRESSIONS 09/25/2023  1. Left ventricular ejection fraction, by estimation, is 60 to 65%. The  left ventricle has normal function. Left ventricular endocardial border  not optimally defined to evaluate regional wall motion. There is moderate  concentric left ventricular  hypertrophy. Left ventricular diastolic parameters are indeterminate.   2. Right ventricular systolic function is normal. The right ventricular  size is mildly enlarged. Tricuspid regurgitation signal is inadequate for  assessing PA pressure.   3. Right atrial size was moderately dilated.   4. The mitral  valve is abnormal. No evidence of mitral valve  regurgitation. Moderate mitral stenosis. The mean mitral valve gradient is  6.0 mmHg. Severe mitral annular calcification.   5. The aortic valve has been repaired/replaced. Aortic valve  regurgitation is not visualized. No aortic stenosis is present. There is a  26 mm Sapien prosthetic (TAVR) valve present in the aortic  position. Echo  findings are consistent with normal  structure and function of the aortic valve prosthesis. Aortic valve mean  gradient measures 11.0 mmHg.   6. The inferior vena cava is dilated in size with >50% respiratory  variability, suggesting right atrial pressure of 8 mmHg.   7. Increased flow velocities may be secondary to anemia, thyrotoxicosis,  hyperdynamic or high flow state.   Laboratory Data: High Sensitivity Troponin:  No results for input(s): TROPONINIHS in the last 720 hours.   Chemistry Recent Labs  Lab 10/31/23 0334 10/31/23 0625  NA 138  --   K 4.3  --   CL 100  --   CO2 29  --   GLUCOSE 96  --   BUN 42*  --   CREATININE 1.92*  --   CALCIUM  8.1*  --   MG  --  1.9  GFRNONAA 25*  --   ANIONGAP 9  --     No results for input(s): PROT, ALBUMIN, AST, ALT, ALKPHOS, BILITOT in the last 168 hours. Lipids No results for input(s): CHOL, TRIG, HDL, LABVLDL, LDLCALC, CHOLHDL in the last 168 hours.  Hematology Recent Labs  Lab 10/31/23 0334  WBC 11.7*  RBC 3.80*  HGB 10.6*  HCT 33.6*  MCV 88.4  MCH 27.9  MCHC 31.5  RDW 16.0*  PLT 181   Thyroid  No results for input(s): TSH, FREET4 in the last 168 hours.  BNPNo results for input(s): BNP, PROBNP in the last 168 hours.  DDimer No results for input(s): DDIMER in the last 168 hours.  Radiology/Studies:  DG Chest Portable 1 View Result Date: 10/31/2023 EXAM: 1 VIEW XRAY OF THE CHEST 10/31/2023 04:05:20 AM COMPARISON: Chest CT 09/24/2023 and earlier. CLINICAL HISTORY: 88 year old female with substernal chest pain starting at midnight, resolved with nitroglycerin  en route. Denies N/V/SHOB. FINDINGS: LUNGS AND PLEURA: Improved lung volumes from last month. Stable chronic probable post bronchoscopy biopsy clip in the right lower lobe. Small pleural effusions last month appear resolved. No focal pulmonary opacity. No pulmonary edema. No pneumothorax. HEART AND MEDIASTINUM: Chronic  cardiomegaly and TAVR. Stable mediastinal contour. Chronic calcified aortic atherosclerosis. BONES AND SOFT TISSUES: No acute osseous abnormality. IMPRESSION: 1. No acute cardiopulmonary abnormality. Electronically signed by: Helayne Hurst MD 10/31/2023 04:32 AM EDT RP Workstation: [id number]     Assessment and Plan:  Chest pain r/o NSTEMI  Nonobstructive CAD on cath in 03/2020 Presents with atypical chest pain that started Sunday night while asleep described as tightness, 10/10, located mid sternal w/o radiation that was constant for 15 mins until resolved with EMS NTG.  No recurrent CP since then.  No previous history of CP EKG: NSR, HR 74, PAC, nonspecific T wave changes, chronic Q waves in anterior leads (no significant changes).  Less concern for ACS in the setting of TN 53 > 53, EKG without ischemic changes, atypical presentation, previous nonobstructive cath in 2022, no tobacco use. Patient agrees to proceed with cardiac cath if necessary, however discussed that it was less likely.  Will discuss with MD Also discussed stress test. Patient agrees to proceed with Lexiscan. If patient agree then can consider inpatient  or outpatient. Patient ate breakfast this am and wouldn't be able to complete today.   AKI  CR 1.92 (baseline Cr 1.5-1.6) Continue to monitor.   HTN  LVH Goal SBP < 140 Initial BP 130/100's. BP this am:  153/60 while in room.  Would avoid restarting Lisinopril  with AKI at this time.  Would Restart home meds: Amlodipine  2.5 mg, Coreg  6.25 BID. Will discuss with MD.  Depending on BP response, can consider adding other home meds: Imdur  30 mg daily , Hydralazine  50 mg TID   Chronic HFpEF Echo 09/2023 showed EF 60 to 65%, moderate LVH, mildly enlarged RV, moderately dilated RA, moderate mitral stenosis,  TAVR with normal structure and function, dilated IVC. Denies any SOB, LE edema, orthopnea.  Appears euvolemic on exam.  No need for IV Lasix  at this time.  PAC's ZIO 10/2022:  NSR with prolonged PR interval, average HR 67, frequent PACs at 13.6%, rare PVCs, 6 episodes of NSVT, 116 episodes of brief PSVT Telemetry: NSR, HR 60s to 70s, PACs,  PVCs K/MG WNL  Denies any palpations.   Paroxysmal atrial fibrillation No signs of recurrent A-fib.  Telemetry events note A-fib but they are actually PACs. Patient is not on AC due to to history of GI bleed.  DM2 09/2023 A1C 6.5 As per primary team  Severe AS s/p TAVR in 2022 TTE 09/2022 revealed stable aortic valve prosthesis function. Stable and denies any concerning symptoms.  Grade 2/6 systolic murmur on exam   Risk Assessment/Risk Scores:  TIMI Risk Score for Unstable Angina or Non-ST Elevation MI:   The patient's TIMI risk score is 3, which indicates a 13% risk of all cause mortality, new or recurrent myocardial infarction or need for urgent revascularization in the next 14 days.  CHA2DS2-VASc Score = 7  This indicates a 11.2% annual risk of stroke. The patient's score is based upon: CHF History: 1 HTN History: 1 Diabetes History: 1 Stroke History: 0 Vascular Disease History: 1 Age Score: 2 Gender Score: 1    For questions or updates, please contact [facility] Please consult [email or web address] for contact info under      Signed, Lorette CINDERELLA Kapur, PA-C  10/31/2023 8:41 AM   Attending Note     Patient seen and discussed with PA Kapur, I agree with her documentation. 88 yo female history of aortic stenosis s/p TAVR, PAF, COPD, HTN, HLD, CKD, DM2, chronic HFpE, presents with chest pain. Reports pain woke her from sleep. On my history she reports a 1/10 aching pain midchest with no other associated symptoms. Started around 10pm, she is not sure if the pain was constant or coming or going but did not call EMS until around 3AM. EMS gave SL and very quickly symptoms resolved. No recurrent symptoms.    K 4.2 Cr 1.92 WBC 11.7 Hgb 10.6 Plt 181  Trop 53-->53 EKG SR, PACs, nonspecific ST/T  changes CXR: no acute process   09/2023 echo: LVE 60-65%, indet diastolic, normal RV function, mod MS mean grad 6, normal TAVR 03/2020 cath: LAD mild LIs, RCA mild LIs, mid LCX 50%,        1.Chest pain - EKG SR with PACs, nonspecific ST/T changes. Mild flat troponin not consistent with ACS - - Cr 1.92, GFR 25.  - in general no strong objective evidence of ischemia by EKG or enzyems. Given renal function and high risk of contrast nephropathy very high threshold to consider cath. She also has history of lung cancer with closely monitored  RLL lung mass with plans for repeat PET and possibly biopsy if hypermetabolic findings.  - would anticipate if low risk or moderate risk stress test would just manage medically.    2.History of TAVR - normal valve function by 09/2023 echo   3. Afib -from notes not on anticoag at home due to recurrent bleeding     Dorn Ross MD

## 2023-10-31 NOTE — ED Triage Notes (Signed)
 Pt arrives via EMS from home with substernal CP starting at midnight. Denies N/V/ [facility]. Pt reports taking 325mg  ASA. 1 sublingual nitroglyceryn given en route by EMS. Pt reports no pain on arrival.

## 2023-10-31 NOTE — Progress Notes (Signed)
 PHARMACY - ANTICOAGULATION CONSULT NOTE  Pharmacy Consult for heparin  Indication: chest pain/ACS, atrial fibrillation  Allergies  Allergen Reactions   Amoxicillin Swelling    Tongue swelling    Nitrofurantoin Nausea And Vomiting   Penicillins Swelling   Sulfa Antibiotics Swelling    Lip swelling    Patient Measurements: Height: 5' 3 (160 cm) Weight: 94.3 kg (207 lb 14.3 oz) IBW/kg (Calculated) : 52.4 HEPARIN  DW (KG): 74.1  Vital Signs: Temp: 98.1 F (36.7 C) (10/13 1349) Temp Source: Oral (10/13 0959) BP: 112/47 (10/13 1349) Pulse Rate: 62 (10/13 1349)  Labs: Recent Labs    10/31/23 0334 10/31/23 1341  HGB 10.6*  --   HCT 33.6*  --   PLT 181  --   HEPARINUNFRC  --  0.31  CREATININE 1.92*  --     Estimated Creatinine Clearance: 23.4 mL/min (A) (by C-G formula based on SCr of 1.92 mg/dL (H)).   Medical History: Past Medical History:  Diagnosis Date   Arthritis    Asthma    CKD (chronic kidney disease)    COPD (chronic obstructive pulmonary disease) (HCC)    Essential hypertension    Heart murmur    Mixed hyperlipidemia    PAF (paroxysmal atrial fibrillation) (HCC)    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 Wonda and Dr. Lucas.   Severe aortic stenosis    Symptomatic anemia    Presumed GI bleed January 2021 - she declined GI work-up   Type 2 diabetes mellitus (HCC)    Assessment: 8 yoF presented with chest pain starting at midnight. Pharmacy consulted to dose heparin  for ACS. PMH includes pAF (not on anticoagulation given bleeding history per notes), severe AS s/p TAVR 2022, HLD, HTN  HL 0.31, low end of goal -Trops 53 -Hgb 10.6 (bl ~11-12), plts 181  Goal of Therapy:  Heparin  level 0.3-0.7 units/ml Monitor platelets by anticoagulation protocol: Yes   Plan:  Continue heparin  infusion at 1100 units/hr Check confirmatory anti-Xa level in 8 hours and daily while  on heparin  Continue to monitor H&H and platelets  Meriah Shands, BS Pharm D, BCPS Clinical Pharmacist 10/31/2023 4:20 PM

## 2023-10-31 NOTE — Progress Notes (Signed)
 ASSUMPTION OF CARE NOTE   10/31/2023 12:58 PM  Stephanie [name] was seen and examined.  The H&P by the admitting provider, orders, imaging was reviewed.  Please see new orders.  Will continue to follow.   Discussed with inpatient cardiology team at [facility].  Plan is for patient to stay at [facility] and have a stress test in AM. NPO at midnight.  Also planning for PT eval tomorrow after stress testing completed.   Vitals:   10/31/23 1220 10/31/23 1222  BP:    Pulse:    Resp:    Temp:    SpO2: 98% 99%    Results for orders placed or performed during the hospital encounter of 10/31/23  Basic metabolic panel   Collection Time: 10/31/23  3:34 AM  Result Value Ref Range   Sodium 138 135 - 145 mmol/L   Potassium 4.3 3.5 - 5.1 mmol/L   Chloride 100 98 - 111 mmol/L   CO2 29 22 - 32 mmol/L   Glucose, Bld 96 70 - 99 mg/dL   BUN 42 (H) 8 - 23 mg/dL   Creatinine, Ser 8.07 (H) 0.44 - 1.00 mg/dL   Calcium  8.1 (L) 8.9 - 10.3 mg/dL   GFR, Estimated 25 (L) >60 mL/min   Anion gap 9 5 - 15  CBC with Differential   Collection Time: 10/31/23  3:34 AM  Result Value Ref Range   WBC 11.7 (H) 4.0 - 10.5 K/uL   RBC 3.80 (L) 3.87 - 5.11 MIL/uL   Hemoglobin 10.6 (L) 12.0 - 15.0 g/dL   HCT 66.3 (L) 63.9 - 53.9 %   MCV 88.4 80.0 - 100.0 fL   MCH 27.9 26.0 - 34.0 pg   MCHC 31.5 30.0 - 36.0 g/dL   RDW 83.9 (H) 88.4 - 84.4 %   Platelets 181 150 - 400 K/uL   nRBC 0.0 0.0 - 0.2 %   Neutrophils Relative % 77 %   Neutro Abs 8.9 (H) 1.7 - 7.7 K/uL   Lymphocytes Relative 8 %   Lymphs Abs 1.0 0.7 - 4.0 K/uL   Monocytes Relative 14 %   Monocytes Absolute 1.6 (H) 0.1 - 1.0 K/uL   Eosinophils Relative 0 %   Eosinophils Absolute 0.1 0.0 - 0.5 K/uL   Basophils Relative 0 %   Basophils Absolute 0.0 0.0 - 0.1 K/uL   Immature Granulocytes 1 %   Abs Immature Granulocytes 0.16 (H) 0.00 - 0.07 K/uL  Troponin T, High Sensitivity   Collection Time: 10/31/23  3:34 AM  Result Value Ref Range   Troponin T High Sensitivity 53  (H) 0 - 19 ng/L  Troponin T, High Sensitivity   Collection Time: 10/31/23  5:58 AM  Result Value Ref Range   Troponin T High Sensitivity 53 (H) 0 - 19 ng/L  Magnesium    Collection Time: 10/31/23  6:25 AM  Result Value Ref Range   Magnesium  1.9 1.7 - 2.4 mg/dL  Phosphorus   Collection Time: 10/31/23  6:25 AM  Result Value Ref Range   Phosphorus 3.6 2.5 - 4.6 mg/dL  CBG monitoring, ED   Collection Time: 10/31/23  8:38 AM  Result Value Ref Range   Glucose-Capillary 81 70 - 99 mg/dL  Glucose, capillary   Collection Time: 10/31/23 11:42 AM  Result Value Ref Range   Glucose-Capillary 133 (H) 70 - 99 mg/dL     Stephanie Louder, MD [facility]   10/31/2023  3:24 AM How to contact the [facility] Attending or Consulting provider  7A - 7P or covering provider during after hours 7P -7A, for this patient?  Check the care team in [facility] and look for a) attending/consulting [facility] provider listed and b) the [facility] team listed Log into [email or web address] and use [place]'s universal password to access. If you do not have the password, please contact the hospital operator. Locate the [facility] provider you are looking for under [facility] and page to a number that you can be directly reached. If you still have difficulty reaching the provider, please page the [facility] (Director on Call) for the Hospitalists listed on amion for assistance.

## 2023-10-31 NOTE — Plan of Care (Signed)
  Problem: Education: Goal: Ability to describe self-care measures that may prevent or decrease complications (Diabetes Survival Skills Education) will improve Outcome: Progressing   Problem: Coping: Goal: Ability to adjust to condition or change in health will improve Outcome: Progressing   Problem: Fluid Volume: Goal: Ability to maintain a balanced intake and output will improve Outcome: Progressing   Problem: Health Behavior/Discharge Planning: Goal: Ability to identify and utilize available resources and services will improve Outcome: Progressing Goal: Ability to manage health-related needs will improve Outcome: Progressing   Problem: Metabolic: Goal: Ability to maintain appropriate glucose levels will improve Outcome: Progressing   Problem: Nutritional: Goal: Maintenance of adequate nutrition will improve Outcome: Progressing   Problem: Health Behavior/Discharge Planning: Goal: Ability to manage health-related needs will improve Outcome: Progressing   Problem: Nutritional: Goal: Progress toward achieving an optimal weight will improve Outcome: Not Progressing   Problem: Skin Integrity: Goal: Risk for impaired skin integrity will decrease Outcome: Not Progressing

## 2023-10-31 NOTE — Hospital Course (Signed)
 88 y.o. female with medical history significant of hypertension, hyperlipidemia, T2DM, asthma/COPD, paroxysmal atrial fibrillation, CKD stage IIIB, aortic stenosis status post TAVR 2022, lung adenocarcinoma status post SBRT 02/2022, GI bleed presenting with chest pain that woke her up from sleep midnight.  She complained of nonradiating, nonreproducible midsternal chest pain described as sharp pain on chest.  EMS was activated and she was given aspirin  325 mg orally x 1 and sublingual nitroglycerin  en route with partial relief of the chest pain on arrival to the ED shortness of breath, nausea, vomiting, diaphoresis, tobacco use or family history of CAD.   Patient was recently admitted from 9/6 to 9/8 due to acute respiratory failure with hypoxemia and hypercarbia secondary to COPD exacerbation and fluid overload in which she was initially presented with supplemental oxygen  at 2 LPM but was weaned off this prior to discharge, COPD was provided with breathing treatment and steroids and was discharged home with prednisone  taper   ED Course:  In the emergency department, BP was 126/54, other vital signs were within normal range.  Workup in the ED showed WBC 11.7, hemoglobin 10.6, hematocrit 33.6, MCV 88.4, platelets 181.  BMP was normal except for BUN/creatinine 42/1.92 (baseline creatinine about 1.5-1.6) eGFR 25, troponin 53.  Patient was started on heparin  drip.  Cardiologist on-call (Dr. Cesario) was consulted and recommended admitting patient to [facility] with plan for cardiology team to consult on patient on arrival to [facility].  [facility] was asked to admit patient.

## 2023-10-31 NOTE — ED Provider Notes (Signed)
 [place] EMERGENCY DEPARTMENT AT [facility] Provider Note   CSN: [id number] Arrival date & time: 10/31/23  9676     Patient presents with: Chest Pain   Stephanie [name] is a 88 y.o. female.   The history is provided by the patient.  Chest Pain  She has history of hypertension, diabetes, COPD, paroxysmal atrial fibrillation not on anticoagulation because of bleeding history, chronic kidney disease, aortic stenosis status post TAVR and comes in because of chest pain.  She states she was awakened at about midnight by a midsternal pain which she describes as a heavy feeling without radiation.  She denies associated dyspnea, nausea, diaphoresis.  She has never had pain like this before.  She came in by ambulance and received aspirin  and 1 nitroglycerin  which gave partial relief of her discomfort.  She is a non-smoker and denies family history of premature coronary atherosclerosis.    Prior to Admission medications   Medication Sig Start Date End Date Taking? Authorizing Provider  albuterol  (PROVENTIL ) (2.5 MG/3ML) 0.083% nebulizer solution Take 3 mLs (2.5 mg total) by nebulization every 2 (two) hours as needed for wheezing or shortness of breath. 08/12/23   Pearlean, Courage, MD  amLODipine  (NORVASC ) 2.5 MG tablet Take 1 tablet (2.5 mg total) by mouth daily. 09/26/23   Evonnie Lenis, MD  carvedilol  (COREG ) 6.25 MG tablet Take 6.25 mg by mouth 2 (two) times daily with a meal.    [provider]  cetirizine (ZYRTEC) 10 MG tablet Take 10 mg by mouth at bedtime.    [provider]  cinacalcet  (SENSIPAR ) 30 MG tablet Take 30 mg by mouth daily. 03/31/23   [provider]  FEROSUL 325 (65 Fe) MG tablet Take 325 mg by mouth daily.    [provider]  furosemide  (LASIX ) 20 MG tablet Take 1 tablet (20 mg total) by mouth daily for 3 days, THEN 1 tablet (20 mg total) daily as needed (leg edema, shortness of breath, or weight gain.). Patient taking differently:  Take 1 -2 tablet (20 mg -40mg  total) by mouth daily for 3 days, THEN 1 tablet (20 mg total) daily as needed (leg edema, shortness of breath, or weight gain.). 08/30/23 12/01/23  Miriam Norris, NP  gabapentin  (NEURONTIN ) 100 MG capsule Take 100-200 mg by mouth at bedtime. 10/02/19   [provider]  GNP VITAMIN D3 EXTRA STRENGTH 25 MCG (1000 UT) tablet Take 1,000 Units by mouth daily. 03/31/23   [provider]  hydrALAZINE  (APRESOLINE ) 50 MG tablet Take 1 tablet (50 mg total) by mouth 3 (three) times daily. 08/12/23   Pearlean Manus, MD  isosorbide  mononitrate (IMDUR ) 30 MG 24 hr tablet Take 1 tablet (30 mg total) by mouth daily. 08/12/23 08/11/24  Pearlean Manus, MD  LANTUS SOLOSTAR 100 UNIT/ML Solostar Pen Inject 40 Units into the skin at bedtime.    [provider]  pantoprazole (PROTONIX) 20 MG tablet Take 1 tablet (20 mg total) by mouth daily. 10/18/23 01/16/24  Melvenia Motto, MD  predniSONE  (DELTASONE ) 10 MG tablet Take 6 tablets (60 mg total) by mouth daily with breakfast. And decrease by 1 tablet daily 09/27/23   Tat, Lenis, MD  sucralfate (CARAFATE) 1 GM/10ML suspension Take 10 mLs (1 g total) by mouth with breakfast, with lunch, and with evening meal. 10/18/23   Melvenia Motto, MD  Tiotropium Bromide-Olodaterol (STIOLTO RESPIMAT ) 2.5-2.5 MCG/ACT AERS Inhale 2 puffs into the lungs daily. 09/21/23   Shelah Lamar RAMAN, MD    Allergies: Amoxicillin,  Nitrofurantoin, Penicillins, and Sulfa antibiotics    Review of Systems  Cardiovascular:  Positive for chest pain.  All other systems reviewed and are negative.   Updated Vital Signs BP (!) 131/103   Pulse 72   Temp 99.1 F (37.3 C) (Oral)   Resp (!) 24   SpO2 94%   Physical Exam Vitals and nursing note reviewed.   88 year old female, resting comfortably and in no acute distress. Vital signs are significant for elevated blood pressure and elevated respiratory rate. Oxygen  saturation is 94%, which is normal. Head is  normocephalic and atraumatic. PERRLA, EOMI. Oropharynx is clear. Neck is nontender and supple without adenopathy. Lungs are clear without rales, wheezes, or rhonchi. Chest is nontender. Heart has regular rate and rhythm without murmur. Abdomen is soft, flat, nontender. Extremities have no cyanosis or edema. Skin is warm and dry without rash. Neurologic: Awake and alert, moves all extremities equally.  (all labs ordered are listed, but only abnormal results are displayed) Labs Reviewed - No data to display  EKG: EKG Interpretation Date/Time:  Monday October 31 2023 03:30:13 EDT Ventricular Rate:  74 PR Interval:  35 QRS Duration:  83 QT Interval:  398 QTC Calculation: 421 R Axis:   -54  Text Interpretation: Sinus rhythm Supraventricular bigeminy Short PR interval Inferior infarct, old Probable anterior infarct, old When compared with ECG of 09/24/2023, Premature ventricular complexes are no longer present Confirmed by Raford Lenis ([id number]) on 10/31/2023 3:38:07 AM  Radiology: No results found.   Procedures   Medications Ordered in the ED - No data to display                                  Medical Decision Making Amount and/or Complexity of Data Reviewed Labs: ordered. Radiology: ordered.  Risk Prescription drug management. Decision regarding hospitalization.   Chest pain.  This is a presentation with a wide range of treatment options and carries with it a high risk of morbidity and complications.  Differential diagnosis includes, but is not limited to, ACS, angina pectoris, esophageal spasm, GERD, pneumonia, pulmonary embolism, aortic dissection.  I reviewed her electrocardiogram, my interpretation is sinus rhythm with PACs but no acute ST or T changes.  I have reviewed her past records and note cardiac catheterization on 04/10/2020 showed the only lesion in her coronary arteries was a 50% lesion in her circumflex artery.  Since she did have a response to initial  nitroglycerin  I have ordered additional nitroglycerin  as well as screening labs and chest x-ray.  Chest x-ray shows no acute cardiopulmonary process.  Have independently viewed the image, and agree with the radiologist's interpretation.  I have reviewed her laboratory tests, and my interpretation is mild leukocytosis which is nonspecific, stable anemia, stable renal insufficiency, mildly elevated initial troponin of 53.  However, given her symptoms, I feel that this is indicative of ACS.  I have ordered heparin  infusion and she will need to be admitted and transferred to [facility].  I have discussed the case with Dr. Manfred of [facility] who agrees to admit the patient.  Have also discussed the case with Dr. Pola of cardiology service who states that they will be happy to see the patient in consultation on arrival to [facility].  CRITICAL CARE Performed by: Lenis Raford Total critical care time: 55 minutes Critical care time was exclusive of separately billable procedures and treating other patients.  Critical care was necessary to treat or prevent imminent or life-threatening deterioration. Critical care was time spent personally by me on the following activities: development of treatment plan with patient and/or surrogate as well as nursing, discussions with consultants, evaluation of patient's response to treatment, examination of patient, obtaining history from patient or surrogate, ordering and performing treatments and interventions, ordering and review of laboratory studies, ordering and review of radiographic studies, pulse oximetry and re-evaluation of patient's condition.     Final diagnoses:  Acute coronary syndrome (HCC)  Normochromic normocytic anemia  Renal insufficiency    ED Discharge Orders     None          Raford Lenis, MD 10/31/23 [phone]

## 2023-10-31 NOTE — Progress Notes (Incomplete)
 Attending Note   Patient seen and discussed with PA Sheron, I agree with her documentation. 88 yo female history of aortic stenosis s/p TAVR, PAF, COPD, HTN, HLD, CKD, DM2, chronic HFpE, presents with chest pain. Reports pain woke her from sleep. On my history she reports a 1/10 aching pain midchest with no other associated symptoms. Started around 10pm, she is not sure if the pain was constant or coming or going but did not call EMS until around 3AM. EMS gave SL and very quickly symptoms resolved. No recurrent symptoms.   K 4.2 Cr 1.92 WBC 11.7 Hgb 10.6 Plt 181  Trop 53-->53 EKG SR, PACs, nonspecific ST/T changes CXR: no acute process  09/2023 echo: LVE 60-65%, indet diastolic, normal RV function, mod MS mean grad 6, normal TAVR 03/2020 cath: LAD mild LIs, RCA mild LIs, mid LCX 50%,     1.Chest pain - EKG SR with PACs, nonspecific ST/T changes. Mild flat troponin not consistent with ACS - - Cr 1.92, GFR 25.  - in general no strong objective evidence of ischemia by EKG or enzyems. Given renal function and high risk of contrast nephropathy very high threshold to consider cath. She also has history of lung cancer with closely monitored RLL lung mass with plans for repeat PET and possibly biopsy if hypermetabolic findings.  - would anticipate if low risk or moderate risk stress test would just manage medically.   2.History of TAVR - normal valve function by 09/2023 echo  3. Afib -from notes not on anticoag at home due to recurrent bleeding   Dorn Ross MD

## 2023-11-01 ENCOUNTER — Observation Stay (HOSPITAL_COMMUNITY)

## 2023-11-01 DIAGNOSIS — R079 Chest pain, unspecified: Secondary | ICD-10-CM

## 2023-11-01 DIAGNOSIS — I5032 Chronic diastolic (congestive) heart failure: Secondary | ICD-10-CM | POA: Diagnosis not present

## 2023-11-01 DIAGNOSIS — J449 Chronic obstructive pulmonary disease, unspecified: Secondary | ICD-10-CM | POA: Diagnosis not present

## 2023-11-01 DIAGNOSIS — N183 Chronic kidney disease, stage 3 unspecified: Secondary | ICD-10-CM

## 2023-11-01 DIAGNOSIS — N179 Acute kidney failure, unspecified: Secondary | ICD-10-CM | POA: Diagnosis not present

## 2023-11-01 DIAGNOSIS — I480 Paroxysmal atrial fibrillation: Secondary | ICD-10-CM | POA: Diagnosis not present

## 2023-11-01 DIAGNOSIS — R0789 Other chest pain: Secondary | ICD-10-CM | POA: Diagnosis not present

## 2023-11-01 LAB — NM MYOCAR MULTI W/SPECT W/WALL MOTION / EF
Estimated workload: 1
Exercise duration (min): 0 min
Exercise duration (sec): 0 s
LV dias vol: 71 mL (ref 46–106)
LV sys vol: 45 mL (ref 3.8–5.2)
MPHR: 135 {beats}/min
Nuc Stress EF: 37 %
Peak HR: 93 {beats}/min
Percent HR: 68 %
RATE: 0.4
Rest HR: 71 {beats}/min
Rest Nuclear Isotope Dose: 9.9 mCi
SDS: 1
SRS: 1
SSS: 2
ST Depression (mm): 0 mm
Stress Nuclear Isotope Dose: 30.5 mCi
TID: 1.05

## 2023-11-01 LAB — COMPREHENSIVE METABOLIC PANEL WITH GFR
ALT: 12 U/L (ref 0–44)
AST: 19 U/L (ref 15–41)
Albumin: 3.6 g/dL (ref 3.5–5.0)
Alkaline Phosphatase: 110 U/L (ref 38–126)
Anion gap: 9 (ref 5–15)
BUN: 38 mg/dL — ABNORMAL HIGH (ref 8–23)
CO2: 28 mmol/L (ref 22–32)
Calcium: 9 mg/dL (ref 8.9–10.3)
Chloride: 99 mmol/L (ref 98–111)
Creatinine, Ser: 1.85 mg/dL — ABNORMAL HIGH (ref 0.44–1.00)
GFR, Estimated: 26 mL/min — ABNORMAL LOW (ref >60)
Glucose, Bld: 77 mg/dL (ref 70–99)
Potassium: 5.1 mmol/L (ref 3.5–5.1)
Sodium: 136 mmol/L (ref 135–145)
Total Bilirubin: 1 mg/dL (ref 0.0–1.2)
Total Protein: 6.8 g/dL (ref 6.5–8.1)

## 2023-11-01 LAB — CBC
HCT: 38.1 % (ref 36.0–46.0)
Hemoglobin: 11.8 g/dL — ABNORMAL LOW (ref 12.0–15.0)
MCH: 27.4 pg (ref 26.0–34.0)
MCHC: 31 g/dL (ref 30.0–36.0)
MCV: 88.6 fL (ref 80.0–100.0)
Platelets: 187 K/uL (ref 150–400)
RBC: 4.3 MIL/uL (ref 3.87–5.11)
RDW: 16.2 % — ABNORMAL HIGH (ref 11.5–15.5)
WBC: 13.4 K/uL — ABNORMAL HIGH (ref 4.0–10.5)
nRBC: 0 % (ref 0.0–0.2)

## 2023-11-01 LAB — GLUCOSE, CAPILLARY
Glucose-Capillary: 53 mg/dL — ABNORMAL LOW (ref 70–99)
Glucose-Capillary: 78 mg/dL (ref 70–99)
Glucose-Capillary: 137 mg/dL — ABNORMAL HIGH (ref 70–99)
Glucose-Capillary: 112 mg/dL — ABNORMAL HIGH (ref 70–99)

## 2023-11-01 LAB — HEPARIN LEVEL (UNFRACTIONATED): Heparin Unfractionated: 0.1 [IU]/mL — ABNORMAL LOW (ref 0.30–0.70)

## 2023-11-01 MED ORDER — TECHNETIUM TC 99M TETROFOSMIN IV KIT
9.9000 | PACK | Freq: Once | INTRAVENOUS | Status: AC | PRN
  Administered 2023-11-01: 9.9 via INTRAVENOUS

## 2023-11-01 MED ORDER — FUROSEMIDE 20 MG PO TABS: ORAL_TABLET | ORAL | Status: DC

## 2023-11-01 MED ORDER — HEPARIN BOLUS VIA INFUSION
3000.0000 [IU] | Freq: Once | INTRAVENOUS | Status: AC
  Administered 2023-11-01: 3000 [IU] via INTRAVENOUS
  Filled 2023-11-01: qty 3000

## 2023-11-01 MED ORDER — DEXTROSE 50 % IV SOLN
INTRAVENOUS | Status: AC
  Administered 2023-11-01: 25 g via INTRAVENOUS
  Filled 2023-11-01: qty 50

## 2023-11-01 MED ORDER — REGADENOSON 0.4 MG/5ML IV SOLN
INTRAVENOUS | Status: AC
  Administered 2023-11-01: 0.4 mg via INTRAVENOUS
  Filled 2023-11-01: qty 5

## 2023-11-01 MED ORDER — DEXTROSE 50 % IV SOLN: 25.0000 g | INTRAVENOUS | Status: AC

## 2023-11-01 MED ORDER — PREDNISONE 10 MG PO TABS: 10.0000 mg | ORAL_TABLET | Freq: Every day | ORAL | Status: DC

## 2023-11-01 MED ORDER — SODIUM CHLORIDE FLUSH 0.9 % IV SOLN
INTRAVENOUS | Status: AC
Start: 2023-11-01 — End: 2023-11-01
  Administered 2023-11-01: 10 mL via INTRAVENOUS
  Filled 2023-11-01: qty 10

## 2023-11-01 MED ORDER — TECHNETIUM TC 99M TETROFOSMIN IV KIT
30.0000 | PACK | Freq: Once | INTRAVENOUS | Status: AC | PRN
  Administered 2023-11-01: 30.5 via INTRAVENOUS

## 2023-11-01 NOTE — Plan of Care (Signed)

## 2023-11-01 NOTE — Care Management Obs Status (Signed)
 MEDICARE OBSERVATION STATUS NOTIFICATION   Patient Details  Name: Stephanie [name] MRN: [id number] Date of Birth: [birth year]   Medicare Observation Status Notification Given:  Yes    Duwaine LITTIE Ada 11/01/2023, 12:37 PM

## 2023-11-01 NOTE — Discharge Instructions (Signed)
IMPORTANT INFORMATION: PAY CLOSE ATTENTION   PHYSICIAN DISCHARGE INSTRUCTIONS  Follow with Primary care provider  [facility]  and other consultants as instructed by your Hospitalist Physician  [place] IF SYMPTOMS COME BACK, WORSEN OR NEW PROBLEM DEVELOPS   Please note: You were cared for by a hospitalist during your hospital stay. Every effort will be made to forward records to your primary care provider.  You can request that your primary care provider send for your hospital records if they have not received them.  Once you are discharged, your primary care physician will handle any further medical issues. Please note that NO REFILLS for any discharge medications will be authorized once you are discharged, as it is imperative that you return to your primary care physician (or establish a relationship with a primary care physician if you do not have one) for your post hospital discharge needs so that they can reassess your need for medications and monitor your lab values.  Please get a complete blood count and chemistry panel checked by your Primary MD at your next visit, and again as instructed by your Primary MD.  Get Medicines reviewed and adjusted: Please take all your medications with you for your next visit with your Primary MD  Laboratory/radiological data: Please request your Primary MD to go over all hospital tests and procedure/radiological results at the follow up, please ask your primary care provider to get all Hospital records sent to his/her office.  In some cases, they will be blood work, cultures and biopsy results pending at the time of your discharge. Please request that your primary care provider follow up on these results.  If you are diabetic, please bring your blood sugar readings with you to your follow up appointment with primary care.    Please call and make your follow up appointments as soon as  possible.    Also Note the following: If you experience worsening of your admission symptoms, develop shortness of breath, life threatening emergency, suicidal or homicidal thoughts you must seek medical attention immediately by calling 911 or calling your MD immediately  if symptoms less severe.  You must read complete instructions/literature along with all the possible adverse reactions/side effects for all the Medicines you take and that have been prescribed to you. Take any new Medicines after you have completely understood and accpet all the possible adverse reactions/side effects.   Do not drive when taking Pain medications or sleeping medications (Benzodiazepines)  Do not take more than prescribed Pain, Sleep and Anxiety Medications. It is not advisable to combine anxiety,sleep and pain medications without talking with your primary care practitioner  Special Instructions: If you have smoked or chewed Tobacco  in the last 2 yrs please stop smoking, stop any regular Alcohol  and or any Recreational drug use.  Wear Seat belts while driving.  Do not drive if taking any narcotic, mind altering or controlled substances or recreational drugs or alcohol.

## 2023-11-01 NOTE — Progress Notes (Signed)
 PHARMACY - ANTICOAGULATION CONSULT NOTE  Pharmacy Consult for heparin  Indication: chest pain/ACS, atrial fibrillation  Allergies  Allergen Reactions   Amoxicillin Swelling    Tongue swelling    Nitrofurantoin Nausea And Vomiting   Penicillins Swelling   Sulfa Antibiotics Swelling    Lip swelling    Patient Measurements: Height: 5' 3 (160 cm) Weight: 93.6 kg (206 lb 5.6 oz) IBW/kg (Calculated) : 52.4 HEPARIN  DW (KG): 74.1  Vital Signs: Temp: 98.3 F (36.8 C) (10/14 0433) Temp Source: Oral (10/14 0433) BP: 123/65 (10/14 0433) Pulse Rate: 73 (10/14 0433)  Labs: Recent Labs    10/31/23 0334 10/31/23 1341 10/31/23 2206  HGB 10.6*  --   --   HCT 33.6*  --   --   PLT 181  --   --   HEPARINUNFRC  --  0.31 0.15*  CREATININE 1.92*  --   --     Estimated Creatinine Clearance: 23.3 mL/min (A) (by C-G formula based on SCr of 1.92 mg/dL (H)).   Medical History: Past Medical History:  Diagnosis Date   Arthritis    Asthma    CKD (chronic kidney disease)    COPD (chronic obstructive pulmonary disease) (HCC)    Essential hypertension    Heart murmur    Mixed hyperlipidemia    PAF (paroxysmal atrial fibrillation) (HCC)    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 Wonda and Dr. Lucas.   Severe aortic stenosis    Symptomatic anemia    Presumed GI bleed January 2021 - she declined GI work-up   Type 2 diabetes mellitus (HCC)    Assessment: 67 yoF presented with chest pain starting at midnight. Pharmacy consulted to dose heparin  for ACS. PMH includes pAF (not on anticoagulation given bleeding history per notes), severe AS s/p TAVR 2022, HLD, HTN.  Heparin  level still low (0.10) after adjustments overnight. CBC appears stable, no infusion or bleeding issues noted.   Goal of Therapy:  Heparin  level 0.3-0.7 units/ml Monitor platelets by anticoagulation protocol: Yes   Plan:  Repeat 3000  unit heparin  bolus then increase infusion to 1700 units/hr Check anti-Xa level in 8 hours and daily while on heparin  Continue to monitor H&H and platelets  Dempsey Blush PharmD., BCPS Clinical Pharmacist 11/01/2023 8:05 AM

## 2023-11-01 NOTE — Progress Notes (Signed)
 OT Cancellation Note  Patient Details Name: Stephanie [name] MRN: [id number] DOB: 1936-01-11   Cancelled Treatment:    Reason Eval/Treat Not Completed: Patient at procedure or test/ unavailable. Pt not in the room at time of attempted evaluation. Will attempt again later as time permits.   Marialena Wollen OT, MOT   [name] 11/01/2023, 11:18 AM

## 2023-11-01 NOTE — Discharge Summary (Signed)
 Physician Discharge Summary  Stephanie [name] FMW:[id number] DOB: December 02, 1935 DOA: 10/31/2023  PCP: [facility]  Admit date: 10/31/2023 Discharge date: 11/01/2023  Admitted From:  Home  Disposition: Home with [facility]  Recommendations for Outpatient Follow-up:  Follow up with PCP in 1 weeks Follow up with cardiology as scheduled   Home Health: PT   Discharge Condition: STABLE   CODE STATUS: FULL DIET:  heart healthy foods    Brief Hospitalization Summary: Please see all hospital notes, images, labs for full details of the hospitalization. Admission provider HPI:   88 y.o. female with medical history significant of hypertension, hyperlipidemia, T2DM, asthma/COPD, paroxysmal atrial fibrillation, CKD stage IIIB, aortic stenosis status post TAVR 2022, lung adenocarcinoma status post SBRT 02/2022, GI bleed presenting with chest pain that woke her up from sleep midnight.  She complained of nonradiating, nonreproducible midsternal chest pain described as sharp pain on chest.  EMS was activated and she was given aspirin  325 mg orally x 1 and sublingual nitroglycerin  en route with partial relief of the chest pain on arrival to the ED shortness of breath, nausea, vomiting, diaphoresis, tobacco use or family history of CAD.   Patient was recently admitted from 9/6 to 9/8 due to acute respiratory failure with hypoxemia and hypercarbia secondary to COPD exacerbation and fluid overload in which she was initially presented with supplemental oxygen  at 2 LPM but was weaned off this prior to discharge, COPD was provided with breathing treatment and steroids and was discharged home with prednisone  taper   ED Course:  In the emergency department, BP was 126/54, other vital signs were within normal range.  Workup in the ED showed WBC 11.7, hemoglobin 10.6, hematocrit 33.6, MCV 88.4, platelets 181.  BMP was normal except for BUN/creatinine 42/1.92 (baseline creatinine about 1.5-1.6) eGFR 25,  troponin 53.  Patient was started on heparin  drip.  Cardiologist on-call (Dr. Cesario) was consulted and recommended admitting patient to [facility] with plan for cardiology team to consult on patient on arrival to [facility].  TRH was asked to admit patient.  Hospital Course  Patient was admitted to the hospital with chest pain symptoms and concern for NSTEMI.  She had been started on IV heparin  infusion and she was evaluated by the inpatient cardiology service.  The decision was made to proceed with nuclear stress test which was performed on 11/01/2023.  Patient ruled out for ACS by high-sensitivity troponins.  She had nuclear stress testing which fortunately has been reassuring without findings suggesting ischemia.  Patient is not a candidate for catheterization given CKD.  With normal nuclear stress testing she is stable to discharge home today.  Outpatient cardiology follow-up being arranged by the heart care team.  Patient is being discharged home in stable condition.  Discharge Diagnoses:  Principal Problem:   Chest pain Active Problems:   Type 2 diabetes mellitus with chronic kidney disease, with long-term current use of insulin  (HCC)   Essential hypertension, benign   COPD (chronic obstructive pulmonary disease) (HCC)   Acute kidney injury superimposed on stage 4 chronic kidney disease (HCC)   Obesity, Class I, BMI 30-34.9   Chronic heart failure with preserved ejection fraction (HFpEF) (HCC)   Paroxysmal atrial fibrillation [facility])  Discharge Instructions:  Allergies as of 11/01/2023       Reactions   Amoxicillin Swelling   Tongue swelling   Nitrofurantoin Nausea And Vomiting   Penicillins Swelling   Sulfa Antibiotics Swelling   Lip swelling  Medication List     TAKE these medications    albuterol  (2.5 MG/3ML) 0.083% nebulizer solution Commonly known as: PROVENTIL  Take 3 mLs (2.5 mg total) by nebulization every 2 (two) hours as needed for wheezing or shortness of breath.    amLODipine  2.5 MG tablet Commonly known as: NORVASC  Take 1 tablet (2.5 mg total) by mouth daily.   carvedilol  6.25 MG tablet Commonly known as: COREG  Take 6.25 mg by mouth 2 (two) times daily with a meal.   cetirizine 10 MG tablet Commonly known as: ZYRTEC Take 10 mg by mouth at bedtime.   cinacalcet  30 MG tablet Commonly known as: SENSIPAR  Take 30 mg by mouth daily.   FeroSul 325 (65 Fe) MG tablet Generic drug: ferrous sulfate  Take 325 mg by mouth daily.   furosemide  20 MG tablet Commonly known as: LASIX  Take 1 -2 tablet (20 mg -40mg  total) by mouth daily for 3 days, THEN 1 tablet (20 mg total) daily as needed (leg edema, shortness of breath, or weight gain.).   gabapentin  100 MG capsule Commonly known as: NEURONTIN  Take 100-200 mg by mouth at bedtime.   GNP Vitamin D3 Extra Strength 25 MCG (1000 UT) tablet Generic drug: Cholecalciferol Take 1,000 Units by mouth daily.   hydrALAZINE  50 MG tablet Commonly known as: APRESOLINE  Take 1 tablet (50 mg total) by mouth 3 (three) times daily.   isosorbide  mononitrate 30 MG 24 hr tablet Commonly known as: IMDUR  Take 1 tablet (30 mg total) by mouth daily.   Lantus SoloStar 100 UNIT/ML Solostar Pen Generic drug: insulin  glargine Inject 40 Units into the skin at bedtime.   pantoprazole 20 MG tablet Commonly known as: Protonix Take 1 tablet (20 mg total) by mouth daily.   predniSONE  10 MG tablet Commonly known as: DELTASONE  Take 1 tablet (10 mg total) by mouth daily with breakfast. And decrease by 1 tablet daily What changed: how much to take   Stiolto Respimat  2.5-2.5 MCG/ACT Aers Generic drug: Tiotropium Bromide-Olodaterol Inhale 2 puffs into the lungs daily.   sucralfate 1 GM/10ML suspension Commonly known as: Carafate Take 10 mLs (1 g total) by mouth with breakfast, with lunch, and with evening meal.        Follow-up Information     [facility] Schedule an appointment as soon as  possible for a visit in 2 week(s).   Why: Hospital Follow Up Contact information: [address] [place] [address] [phone]         Miriam Norris, NP Follow up.   Specialty: Cardiology Why: Keep scheduled Cardiology follow-up for 12/01/2023 at 3:10 PM. Contact information: [address] [place] [employer] [address] [phone]                Allergies  Allergen Reactions   Amoxicillin Swelling    Tongue swelling    Nitrofurantoin Nausea And Vomiting   Penicillins Swelling   Sulfa Antibiotics Swelling    Lip swelling   Allergies as of 11/01/2023       Reactions   Amoxicillin Swelling   Tongue swelling   Nitrofurantoin Nausea And Vomiting   Penicillins Swelling   Sulfa Antibiotics Swelling   Lip swelling        Medication List     TAKE these medications    albuterol  (2.5 MG/3ML) 0.083% nebulizer solution Commonly known as: PROVENTIL  Take 3 mLs (2.5 mg total) by nebulization every 2 (two) hours as needed for wheezing or shortness of breath.  amLODipine  2.5 MG tablet Commonly known as: NORVASC  Take 1 tablet (2.5 mg total) by mouth daily.   carvedilol  6.25 MG tablet Commonly known as: COREG  Take 6.25 mg by mouth 2 (two) times daily with a meal.   cetirizine 10 MG tablet Commonly known as: ZYRTEC Take 10 mg by mouth at bedtime.   cinacalcet  30 MG tablet Commonly known as: SENSIPAR  Take 30 mg by mouth daily.   FeroSul 325 (65 Fe) MG tablet Generic drug: ferrous sulfate  Take 325 mg by mouth daily.   furosemide  20 MG tablet Commonly known as: LASIX  Take 1 -2 tablet (20 mg -40mg  total) by mouth daily for 3 days, THEN 1 tablet (20 mg total) daily as needed (leg edema, shortness of breath, or weight gain.).   gabapentin  100 MG capsule Commonly known as: NEURONTIN  Take 100-200 mg by mouth at bedtime.   GNP Vitamin D3 Extra Strength 25 MCG (1000 UT) tablet Generic drug: Cholecalciferol Take 1,000 Units by mouth daily.   hydrALAZINE  50  MG tablet Commonly known as: APRESOLINE  Take 1 tablet (50 mg total) by mouth 3 (three) times daily.   isosorbide  mononitrate 30 MG 24 hr tablet Commonly known as: IMDUR  Take 1 tablet (30 mg total) by mouth daily.   Lantus SoloStar 100 UNIT/ML Solostar Pen Generic drug: insulin  glargine Inject 40 Units into the skin at bedtime.   pantoprazole 20 MG tablet Commonly known as: Protonix Take 1 tablet (20 mg total) by mouth daily.   predniSONE  10 MG tablet Commonly known as: DELTASONE  Take 1 tablet (10 mg total) by mouth daily with breakfast. And decrease by 1 tablet daily What changed: how much to take   Stiolto Respimat  2.5-2.5 MCG/ACT Aers Generic drug: Tiotropium Bromide-Olodaterol Inhale 2 puffs into the lungs daily.   sucralfate 1 GM/10ML suspension Commonly known as: Carafate Take 10 mLs (1 g total) by mouth with breakfast, with lunch, and with evening meal.        Procedures/Studies: NM Myocar Multi W/Spect W/Wall Motion / EF Result Date: 11/01/2023   The study is normal. There are no perfusion defects.  The study is intermediate risk based purely on decreased ejection fraction, consider correlating with echocardiogram.   The study is intermediate risk.   No ST deviation was noted.   LV perfusion is normal.   Left ventricular function is abnormal. Nuclear stress EF: 37%. The left ventricular ejection fraction is moderately decreased (30-44%). End diastolic cavity size is normal.   ECHOCARDIOGRAM LIMITED Result Date: 10/31/2023    ECHOCARDIOGRAM LIMITED REPORT   Patient Name:   Stephanie [name] Date of Exam: 10/31/2023 Medical Rec #:  [id number]         Height:       63.0 in Accession #:    [id number]        Weight:       207.9 lb Date of Birth:  [birth year]         BSA:          1.966 m Patient Age:    87 years          BP:           169/90 mmHg Patient Gender: F                 HR:           73 bpm. Exam Location:  [facility] Procedure: Limited Echo (Both Spectral and Color  Flow Doppler were utilized  during procedure). Indications:    Chest Pain R07.9                 Evaluate LVFX and Wall motion  History:        Patient has prior history of Echocardiogram examinations, most                 recent 09/25/2023. COPD; Risk Factors:Hypertension, Diabetes and                 Dyslipidemia. Hx of CKD and TAVR.  Sonographer:    Aida Pizza RCS Referring Phys: [id number] DORN FALCON BRANCH IMPRESSIONS  1. Left ventricular ejection fraction, by estimation, is 70 to 75%. The left ventricle has hyperdynamic function. The left ventricle has no regional wall motion abnormalities. There is severe left ventricular hypertrophy.  2. Right ventricular systolic function is normal. The right ventricular size is normal.  3. The inferior vena cava is normal in size with greater than 50% respiratory variability, suggesting right atrial pressure of 3 mmHg.  4. Limited echo evaluate LV function and wall motion. FINDINGS  Left Ventricle: Left ventricular ejection fraction, by estimation, is 70 to 75%. The left ventricle has hyperdynamic function. The left ventricle has no regional wall motion abnormalities. There is severe left ventricular hypertrophy. Right Ventricle: The right ventricular size is normal. Right vetricular wall thickness was not well visualized. Right ventricular systolic function is normal. Venous: The inferior vena cava is normal in size with greater than 50% respiratory variability, suggesting right atrial pressure of 3 mmHg. LEFT VENTRICLE PLAX 2D LVIDd:         4.50 cm LVIDs:         2.90 cm LV PW:         1.70 cm LV IVS:        1.80 cm  LEFT ATRIUM         Index LA diam:    5.40 cm 2.75 cm/m   AORTA Ao Root diam: 3.70 cm DORN Ross MD Electronically signed by DORN Ross MD Signature Date/Time: 10/31/2023/3:02:08 PM    Final    DG Chest Portable 1 View Result Date: 10/31/2023 EXAM: 1 VIEW XRAY OF THE CHEST 10/31/2023 04:05:20 AM COMPARISON: Chest CT 09/24/2023 and  earlier. CLINICAL HISTORY: 88 year old female with substernal chest pain starting at midnight, resolved with nitroglycerin  en route. Denies N/V/SHOB. FINDINGS: LUNGS AND PLEURA: Improved lung volumes from last month. Stable chronic probable post bronchoscopy biopsy clip in the right lower lobe. Small pleural effusions last month appear resolved. No focal pulmonary opacity. No pulmonary edema. No pneumothorax. HEART AND MEDIASTINUM: Chronic cardiomegaly and TAVR. Stable mediastinal contour. Chronic calcified aortic atherosclerosis. BONES AND SOFT TISSUES: No acute osseous abnormality. IMPRESSION: 1. No acute cardiopulmonary abnormality. Electronically signed by: Helayne Hurst MD 10/31/2023 04:32 AM EDT RP Workstation: [id number]   CT ABDOMEN PELVIS WO CONTRAST Result Date: 10/18/2023 CLINICAL DATA:  Abdominal pain for 2 weeks. EXAM: CT ABDOMEN AND PELVIS WITHOUT CONTRAST TECHNIQUE: Multidetector CT imaging of the abdomen and pelvis was performed following the standard protocol without IV contrast. RADIATION DOSE REDUCTION: This exam was performed according to the departmental dose-optimization program which includes automated exposure control, adjustment of the mA and/or kV according to patient size and/or use of iterative reconstruction technique. COMPARISON:  CT 04/07/2023 FINDINGS: Lower chest: Nodular density at the right lower lobe with fiducial marker is unchanged from chest CT earlier this month, only partially included in the field of view. Trace left pleural effusion,  unchanged. Improved right pleural effusion. Hepatobiliary: Scattered cysts in the liver are unchanged from prior exam. The gallbladder is moderately distended. No calcified gallstone. No biliary dilatation. Pancreas: Mild parenchymal atrophy. No ductal dilatation or inflammation. Spleen: Normal in size without focal abnormality. Adrenals/Urinary Tract: Normal right adrenal gland. Stable 2.2 cm left adrenal adenoma. No hydronephrosis. Multiple  right renal cysts. No further follow-up imaging is recommended. Bilateral renal parenchymal thinning. No renal calculi. Partially distended urinary bladder, no wall thickening Stomach/Bowel: Colonic diverticulosis, prominent involving the distal descending and sigmoid colon. No diverticulitis. Moderate colonic stool burden. No small bowel distension or obstruction. Partially distended stomach is normal for degree of distension. The appendix is not seen, surgically absent per history. Vascular/Lymphatic: Aortic atherosclerosis. No aortic aneurysm. No suspicious lymphadenopathy. Reproductive: Uterus and bilateral adnexa are unremarkable. Other: No free air or ascites. Soft tissue densities in the lower anterior abdominal wall typical of medication injection sites. Fat containing left inguinal hernia. Musculoskeletal: Posterior L2 through S1 fusion. Advanced chronic changes at the L1-L2 disc space are stable from prior exam with endplate sclerosis, irregularity and vacuum phenomenon. Bilateral hip osteoarthritis. Fatty atrophy of the gluteal musculature. IMPRESSION: 1. No acute abnormality in the abdomen/pelvis. 2. Colonic diverticulosis without diverticulitis. 3. Stable left adrenal adenoma. Aortic Atherosclerosis (ICD10-I70.0). Electronically Signed   By: Andrea Gasman M.D.   On: 10/18/2023 19:31     Subjective: Patient reports that she is feeling well no further chest pain or shortness of breath symptoms she tolerated stress testing fine.  Discharge Exam: Vitals:   11/01/23 1352 11/01/23 1403  BP: (!) 119/104 (!) 148/80  Pulse: 77   Resp: 20   Temp: 98.5 F (36.9 C)   SpO2: 94%    Vitals:   11/01/23 0820 11/01/23 0826 11/01/23 1352 11/01/23 1403  BP:   (!) 119/104 (!) 148/80  Pulse:   77   Resp:   20   Temp:   98.5 F (36.9 C)   TempSrc:   Oral   SpO2: 96% 96% 94%   Weight:      Height:       General: Pt is alert, awake, not in acute distress Cardiovascular: Normal S1/S2 +, no rubs,  no gallops Respiratory: CTA bilaterally, no wheezing, no rhonchi Abdominal: Soft, NT, ND, bowel sounds + Extremities: no edema, no cyanosis   The results of significant diagnostics from this hospitalization (including imaging, microbiology, ancillary and laboratory) are listed below for reference.     Microbiology: No results found for this or any previous visit (from the past 240 hours).   Labs: BNP (last 3 results) Recent Labs    08/12/23 0907 09/03/23 1046 09/24/23 0715  BNP 363.0* 581.0* 724.0*   Basic Metabolic Panel: Recent Labs  Lab 10/31/23 0334 10/31/23 0625 11/01/23 0817  NA 138  --  136  K 4.3  --  5.1  CL 100  --  99  CO2 29  --  28  GLUCOSE 96  --  77  BUN 42*  --  38*  CREATININE 1.92*  --  1.85*  CALCIUM  8.1*  --  9.0  MG  --  1.9  --   PHOS  --  3.6  --    Liver Function Tests: Recent Labs  Lab 11/01/23 0817  AST 19  ALT 12  ALKPHOS 110  BILITOT 1.0  PROT 6.8  ALBUMIN 3.6   No results for input(s): LIPASE, AMYLASE in the last 168 hours. No results for input(s): AMMONIA in  the last 168 hours. CBC: Recent Labs  Lab 10/31/23 0334 11/01/23 0817  WBC 11.7* 13.4*  NEUTROABS 8.9*  --   HGB 10.6* 11.8*  HCT 33.6* 38.1  MCV 88.4 88.6  PLT 181 187   Cardiac Enzymes: No results for input(s): CKTOTAL, CKMB, CKMBINDEX, TROPONINI in the last 168 hours. BNP: Invalid input(s): POCBNP CBG: Recent Labs  Lab 10/31/23 1633 10/31/23 2107 11/01/23 0737 11/01/23 0813 11/01/23 1128  GLUCAP 93 196* 53* 78 137*   D-Dimer No results for input(s): DDIMER in the last 72 hours. Hgb A1c No results for input(s): HGBA1C in the last 72 hours. Lipid Profile No results for input(s): CHOL, HDL, LDLCALC, TRIG, CHOLHDL, LDLDIRECT in the last 72 hours. Thyroid  function studies No results for input(s): TSH, T4TOTAL, T3FREE, THYROIDAB in the last 72 hours.  Invalid input(s): FREET3 Anemia work up No results for  input(s): VITAMINB12, FOLATE, FERRITIN, TIBC, IRON, RETICCTPCT in the last 72 hours. Urinalysis    Component Value Date/Time   COLORURINE YELLOW 10/18/2023 1451   APPEARANCEUR CLEAR 10/18/2023 1451   APPEARANCEUR Hazy (A) 05/23/2020 1119   LABSPEC 1.016 10/18/2023 1451   PHURINE 5.0 10/18/2023 1451   GLUCOSEU NEGATIVE 10/18/2023 1451   HGBUR NEGATIVE 10/18/2023 1451   BILIRUBINUR NEGATIVE 10/18/2023 1451   BILIRUBINUR Negative 05/23/2020 1119   KETONESUR NEGATIVE 10/18/2023 1451   PROTEINUR 100 (A) 10/18/2023 1451   UROBILINOGEN 0.2 04/25/2014 0936   NITRITE NEGATIVE 10/18/2023 1451   LEUKOCYTESUR NEGATIVE 10/18/2023 1451   Sepsis Labs Recent Labs  Lab 10/31/23 0334 11/01/23 0817  WBC 11.7* 13.4*   Microbiology No results found for this or any previous visit (from the past 240 hours).  Time coordinating discharge:  35 mins   SIGNED:  Afton Louder, MD  [facility] 11/01/2023, 4:43 PM How to contact the [facility] Attending or Consulting provider 7A - 7P or covering provider during after hours 7P -7A, for this patient?  Check the care team in [facility] and look for a) attending/consulting [facility] provider listed and b) the [facility] team listed Log into [email or web address] and use [facility] universal password to access. If you do not have the password, please contact the hospital operator. Locate the [facility] provider you are looking for under [facility] and page to a number that you can be directly reached. If you still have difficulty reaching the provider, please page the [facility] (Director on Call) for the Hospitalists listed on amion for assistance.

## 2023-11-01 NOTE — Progress Notes (Signed)
 PT Cancellation Note  Patient Details Name: Stephanie [name] MRN: [id number] DOB: Feb 05, 1935   Cancelled Treatment:    Reason Eval/Treat Not Completed: Patient at procedure or test/unavailable. MD, nursing staff still awaiting results of stress test.   3:30 PM, 11/01/23 Lynwood Music, MPT Physical Therapist with [facility] [phone] office [phone] mobile phone

## 2023-11-01 NOTE — Progress Notes (Addendum)
 Rounding Note   Patient Name: Stephanie [name] Date of Encounter: 11/01/2023   [facility] Cardiologist: Jayson Sierras, MD   Subjective  Evaluated at the time of her stress test. Denies any recurrent pain overnight. Breathing has been stable. Anxious to go home if test results are reassuring.   Scheduled Meds:  amLODipine   2.5 mg Oral Daily   arformoterol   15 mcg Nebulization BID   And   umeclidinium bromide  1 puff Inhalation Daily   carvedilol   6.25 mg Oral BID WC   furosemide   20 mg Oral Daily   heparin   3,000 Units Intravenous Once   insulin  aspart  0-9 Units Subcutaneous TID WC   isosorbide  mononitrate  30 mg Oral Daily   nystatin   Topical TID   pantoprazole  40 mg Oral Daily   Continuous Infusions:  heparin  1,400 Units/hr (10/31/23 2318)   PRN Meds: acetaminophen  **OR** acetaminophen , nitroGLYCERIN , ondansetron  **OR** ondansetron  (ZOFRAN ) IV, phenol, technetium tetrofosmin   Vital Signs  Vitals:   11/01/23 0442 11/01/23 0817 11/01/23 0820 11/01/23 0826  BP:  134/88    Pulse:      Resp:      Temp:      TempSrc:      SpO2:   96% 96%  Weight: 93.6 kg     Height:        Intake/Output Summary (Last 24 hours) at 11/01/2023 1007 Last data filed at 10/31/2023 2318 Gross per 24 hour  Intake 944.24 ml  Output --  Net 944.24 ml      11/01/2023    4:42 AM 10/31/2023    9:59 AM 10/31/2023    4:15 AM  Last 3 Weights  Weight (lbs) 206 lb 5.6 oz 207 lb 14.3 oz 201 lb 15.1 oz  Weight (kg) 93.6 kg 94.3 kg 91.6 kg      Telemetry  Not reviewed. Seen in nuc med.   ECG   NSR, HR 74 with PAC's.  - Personally Reviewed  Physical Exam  GEN: Pleasant, elderly female appearing in acute distress.   Neck: No JVD Cardiac: RRR, 2/6 SEM along RUSB.  Respiratory: Clear to auscultation bilaterally. GI: Soft, nontender, non-distended  MS: No pitting edema; No deformity. Neuro:  Nonfocal  Psych: Normal affect   Labs High Sensitivity Troponin:  No  results for input(s): TROPONINIHS in the last 720 hours.   Chemistry Recent Labs  Lab 10/31/23 0334 10/31/23 0625 11/01/23 0817  NA 138  --  136  K 4.3  --  5.1  CL 100  --  99  CO2 29  --  28  GLUCOSE 96  --  77  BUN 42*  --  38*  CREATININE 1.92*  --  1.85*  CALCIUM  8.1*  --  9.0  MG  --  1.9  --   PROT  --   --  6.8  ALBUMIN  --   --  3.6  AST  --   --  19  ALT  --   --  12  ALKPHOS  --   --  110  BILITOT  --   --  1.0  GFRNONAA 25*  --  26*  ANIONGAP 9  --  9    Lipids No results for input(s): CHOL, TRIG, HDL, LABVLDL, LDLCALC, CHOLHDL in the last 168 hours.  Hematology Recent Labs  Lab 10/31/23 0334 11/01/23 0817  WBC 11.7* 13.4*  RBC 3.80* 4.30  HGB 10.6* 11.8*  HCT 33.6* 38.1  MCV  88.4 88.6  MCH 27.9 27.4  MCHC 31.5 31.0  RDW 16.0* 16.2*  PLT 181 187   Thyroid  No results for input(s): TSH, FREET4 in the last 168 hours.  BNPNo results for input(s): BNP, PROBNP in the last 168 hours.  DDimer No results for input(s): DDIMER in the last 168 hours.   Radiology   DG Chest Portable 1 View Result Date: 10/31/2023 EXAM: 1 VIEW XRAY OF THE CHEST 10/31/2023 04:05:20 AM COMPARISON: Chest CT 09/24/2023 and earlier. CLINICAL HISTORY: 88 year old female with substernal chest pain starting at midnight, resolved with nitroglycerin  en route. Denies N/V/SHOB. FINDINGS: LUNGS AND PLEURA: Improved lung volumes from last month. Stable chronic probable post bronchoscopy biopsy clip in the right lower lobe. Small pleural effusions last month appear resolved. No focal pulmonary opacity. No pulmonary edema. No pneumothorax. HEART AND MEDIASTINUM: Chronic cardiomegaly and TAVR. Stable mediastinal contour. Chronic calcified aortic atherosclerosis. BONES AND SOFT TISSUES: No acute osseous abnormality. IMPRESSION: 1. No acute cardiopulmonary abnormality. Electronically signed by: Helayne Hurst MD 10/31/2023 04:32 AM EDT RP Workstation: [id number]    Cardiac  Studies   Cardiac Catheterization: 03/2020 Mid Cx lesion is 50% stenosed. LV end diastolic pressure is mildly elevated. There is mild aortic valve stenosis. Calculated valve area 1.56 cm2. Ao sat 98%, PA sat 73%, PA pressure 39/5, mean PA pressure 19 mm Hg; mean PCWP 14 mm Hg; CO 6.5 L/min; CI 3.3. Small radial artery. Ulnar access obtained. Vasospasm noted.   Nonobstructive CAD.  Mild aortic stenosis.     Will hydrate gently post cath given renal insufficiency.  Ultimately, may need diuresis.    Limited Echo: 10/31/2023 IMPRESSIONS     1. Left ventricular ejection fraction, by estimation, is 70 to 75%. The  left ventricle has hyperdynamic function. The left ventricle has no  regional wall motion abnormalities. There is severe left ventricular  hypertrophy.   2. Right ventricular systolic function is normal. The right ventricular  size is normal.   3. The inferior vena cava is normal in size with greater than 50%  respiratory variability, suggesting right atrial pressure of 3 mmHg.   4. Limited echo evaluate LV function and wall motion.   Patient Profile   88 y.o. female w/ PMH of severe AS (s/p TAVR in 2022), nonobstructive CAD by cath in 03/2020, paroxysmal atrial fibrillation, COPD, HTN, HLD, Type 2 DM, Stage 3 CKD, hx of lung cancer (receiving tx), and history of presumed GIB who is currently admitted for evaluation of chest pain.   Assessment & Plan   1. Atypical Chest Pain/Elevated Troponin Values/CAD - Presented with chest pain which awoke her from sleep. No association with exertion but did improve with SL NTG. EKG with nonspecific ST changes and Hs Troponin values flat at 53. Limited echo shows a hyperdynamic EF of 70-75% with no regional WMA and normal RV function.  - NST was recommended for further assessment with plans for cath only if high-risk given her CKD and history of lung cancer with plans for repeat PET and possible biopsy pending results.  - Lexiscan Myoview  performed this morning with official results pending following stress images.  - Remains on IV Heparin  for now along with Coreg  6.25mg  BID and Imdur  30mg  daily. Unclear why not on statin therapy.   2. Acute on Chronic Stage 3 CKD - Baseline creatinine 1.5 - 1.6. At 1.92 on admission and improving to 1.85 today.   3. AS - She is s/p TAVR in 2022. Echo in 09/2023 showed  normal valve function.   4. Paroxysmal Atrial Fibrillation/PAC's - She has not been on anticoagulation due to history of GIB. Currently in NSR and she has been continued on Coreg  6.25mg  BID.   5. Chronic HFpEF - Appears euvolemic by examination. She has been continued on PO Lasix  20mg  daily.   For questions or updates, please contact [facility] Please consult [email or web address] for contact info under    Signed, Laymon CHRISTELLA Qua, PA-C  11/01/2023, 10:07 AM     Attending Note Patient seen and discussed with PA Qua, I agree with her documentation  1.Chest pain - EKG SR with PACs, nonspecific ST/T changes. Mild flat troponin not consistent with ACS - - Cr 1.92, GFR 25.  - nuclear stress without ischemia. Suggested low EF but limited echo yesterday LVEF 70-75% -Given renal function and high risk of contrast nephropathy very high threshold to consider cath. She also has history of lung cancer with closely monitored RLL lung mass with plans for repeat PET and possibly biopsy if hypermetabolic findings.   - negative workup, no further cardiac testing planned at this time.    2.History of TAVR - normal valve function by 09/2023 echo   3. Afib -from notes not on anticoag at home due to recurrent bleeding    We will sign off inpatient care  Dorn Ross MD

## 2023-11-01 NOTE — Progress Notes (Signed)
     Stephanie [name] presented for a Lexiscan nuclear stress test today.  I Laymon CHRISTELLA Qua, PA-C, provided direct supervision and was present during the stress portion of the study today, which was completed without significant symptoms, immediate complications, or acute ST/T changes on ECG.  Stress imaging is pending at this time.  Preliminary ECG findings may be listed in the chart, but the stress test result will not be finalized until perfusion imaging is complete.  Laymon CHRISTELLA Qua, PA-C  11/01/2023, 9:46 AM

## 2023-11-07 ENCOUNTER — Other Ambulatory Visit: Payer: Self-pay

## 2023-11-07 ENCOUNTER — Emergency Department (HOSPITAL_COMMUNITY)
Admission: EM | Admit: 2023-11-07 | Discharge: 2023-11-07 | Disposition: A | Discharge status: Home / Self Care | Attending: Emergency Medicine | Admitting: Emergency Medicine

## 2023-11-07 ENCOUNTER — Encounter (HOSPITAL_COMMUNITY): Payer: Self-pay

## 2023-11-07 DIAGNOSIS — B372 Candidiasis of skin and nail: Secondary | ICD-10-CM | POA: Insufficient documentation

## 2023-11-07 DIAGNOSIS — R1032 Left lower quadrant pain: Secondary | ICD-10-CM | POA: Diagnosis present

## 2023-11-07 DIAGNOSIS — E119 Type 2 diabetes mellitus without complications: Secondary | ICD-10-CM | POA: Diagnosis not present

## 2023-11-07 DIAGNOSIS — Z7984 Long term (current) use of oral hypoglycemic drugs: Secondary | ICD-10-CM | POA: Insufficient documentation

## 2023-11-07 DIAGNOSIS — Z794 Long term (current) use of insulin: Secondary | ICD-10-CM | POA: Insufficient documentation

## 2023-11-07 MED ORDER — NYSTATIN 100000 UNIT/GM EX CREA: TOPICAL_CREAM | CUTANEOUS | 0 refills | Status: DC

## 2023-11-07 MED ORDER — NYSTATIN 100000 UNIT/GM EX CREA
TOPICAL_CREAM | CUTANEOUS | Status: AC
  Filled 2023-11-07: qty 15

## 2023-11-07 NOTE — ED Provider Notes (Signed)
 [place] EMERGENCY DEPARTMENT AT [facility] Provider Note   CSN: [id number] Arrival date & time: 11/07/23  2159     Patient presents with: Abdominal Pain and Wound Check   Stephanie [name] is a 88 y.o. female.    Abdominal Pain Wound Check Associated symptoms include abdominal pain.   This patient is a obese 88 year old female, the patient was recently admitted to the hospital during which time she was noted to have respiratory failure with hypoxemia back in September about a month and a half ago, the patient was seen in the emergency department approximately 1 week ago, she was found to have a troponin of 53 and was started on heparin , the patient was admitted to [facility] under the hospitalist service, there was concern for NSTEMI, testing was reassuring and the patient was discharged home.  She reports tonight that she is having some pain in her left inguinal region and states that this has been going on for at least 2 weeks.  She had not originally told anybody about it while she was in the hospital because it was not that bad but states that tonight she felt like the pain was stronger and when she looked down into that region she noticed a skin tear in the crease of her left inguinal crease.  She has had some foul-smelling discharge in that area.  She denies fevers chills nausea vomiting or swelling of the legs.  Paramedics were called because of the increasing pain this evening, the patient does not have significant pain when she is resting, when she pulls the skin back on her abdomen it causes increasing pain.    Prior to Admission medications   Medication Sig Start Date End Date Taking? Authorizing Provider  nystatin cream (MYCOSTATIN) Apply to affected area 2 times daily 11/07/23  Yes Cleotilde Rogue, MD  albuterol  (PROVENTIL ) (2.5 MG/3ML) 0.083% nebulizer solution Take 3 mLs (2.5 mg total) by nebulization every 2 (two) hours as needed for wheezing or shortness of  breath. 08/12/23   Pearlean Manus, MD  amLODipine  (NORVASC ) 2.5 MG tablet Take 1 tablet (2.5 mg total) by mouth daily. 09/26/23   Evonnie Lenis, MD  carvedilol  (COREG ) 6.25 MG tablet Take 6.25 mg by mouth 2 (two) times daily with a meal.    [provider]  cetirizine (ZYRTEC) 10 MG tablet Take 10 mg by mouth at bedtime.    [provider]  cinacalcet  (SENSIPAR ) 30 MG tablet Take 30 mg by mouth daily. 03/31/23   [provider]  FEROSUL 325 (65 Fe) MG tablet Take 325 mg by mouth daily.    [provider]  furosemide  (LASIX ) 20 MG tablet Take 1 -2 tablet (20 mg -40mg  total) by mouth daily for 3 days, THEN 1 tablet (20 mg total) daily as needed (leg edema, shortness of breath, or weight gain.). 11/01/23   Johnson, Clanford L, MD  gabapentin  (NEURONTIN ) 100 MG capsule Take 100-200 mg by mouth at bedtime. 10/02/19   [provider]  GNP VITAMIN D3 EXTRA STRENGTH 25 MCG (1000 UT) tablet Take 1,000 Units by mouth daily. 03/31/23   [provider]  hydrALAZINE  (APRESOLINE ) 50 MG tablet Take 1 tablet (50 mg total) by mouth 3 (three) times daily. 08/12/23   Pearlean Manus, MD  isosorbide  mononitrate (IMDUR ) 30 MG 24 hr tablet Take 1 tablet (30 mg total) by mouth daily. 08/12/23 08/11/24  Pearlean Manus, MD  LANTUS SOLOSTAR 100 UNIT/ML Solostar Pen Inject 40 Units into the  skin at bedtime.    [provider]  pantoprazole (PROTONIX) 20 MG tablet Take 1 tablet (20 mg total) by mouth daily. 10/18/23 01/16/24  Melvenia Motto, MD  predniSONE  (DELTASONE ) 10 MG tablet Take 1 tablet (10 mg total) by mouth daily with breakfast. And decrease by 1 tablet daily 11/01/23   Vicci, Clanford L, MD  sucralfate (CARAFATE) 1 GM/10ML suspension Take 10 mLs (1 g total) by mouth with breakfast, with lunch, and with evening meal. 10/18/23   Melvenia Motto, MD  Tiotropium Bromide-Olodaterol (STIOLTO RESPIMAT ) 2.5-2.5 MCG/ACT AERS Inhale 2 puffs into the lungs daily. 09/21/23   Shelah Lamar RAMAN, MD    Allergies: Amoxicillin, Nitrofurantoin, Penicillins, and Sulfa antibiotics    Review of Systems  Gastrointestinal:  Positive for abdominal pain.  All other systems reviewed and are negative.   Updated Vital Signs BP (!) 142/85 (BP Location: Right Arm)   Pulse 76   Temp 98 F (36.7 C) (Oral)   Resp 18   Ht 1.651 m (5' 5)   Wt 91.8 kg   SpO2 95%   BMI 33.68 kg/m   Physical Exam Vitals and nursing note reviewed.  Constitutional:      General: She is not in acute distress.    Appearance: She is well-developed.  HENT:     Head: Normocephalic and atraumatic.     Mouth/Throat:     Pharynx: No oropharyngeal exudate.  Eyes:     General: No scleral icterus.       Right eye: No discharge.        Left eye: No discharge.     Conjunctiva/sclera: Conjunctivae normal.     Pupils: Pupils are equal, round, and reactive to light.  Neck:     Thyroid : No thyromegaly.     Vascular: No JVD.  Cardiovascular:     Rate and Rhythm: Normal rate and regular rhythm.     Heart sounds: Normal heart sounds. No murmur heard.    No friction rub. No gallop.  Pulmonary:     Effort: Pulmonary effort is normal. No respiratory distress.     Breath sounds: Normal breath sounds. No wheezing or rales.  Abdominal:     General: Bowel sounds are normal. There is no distension.     Palpations: Abdomen is soft. There is no mass.     Tenderness: There is no abdominal tenderness.     Comments: The abdomen is completely nontender however in the inguinal crease there appears to be some erythema and moistness to the skin consistent with a yeast candidal infection, there is a skin tear subcentimeter in this crease, there is no surrounding induration  Musculoskeletal:        General: No tenderness. Normal range of motion.     Cervical back: Normal range of motion and neck supple.     Right lower leg: No edema.     Left lower leg: No edema.  Lymphadenopathy:     Cervical: No cervical adenopathy.   Skin:    General: Skin is warm and dry.     Findings: No erythema or rash.  Neurological:     Mental Status: She is alert.     Coordination: Coordination normal.  Psychiatric:        Behavior: Behavior normal.     (all labs ordered are listed, but only abnormal results are displayed) Labs Reviewed - No data to display  EKG: None  Radiology: No results found.   Procedures   Medications Ordered  in the ED  nystatin cream (MYCOSTATIN) (has no administration in time range)                                    Medical Decision Making Risk Prescription drug management.   Left-sided inguinal candidal infection with associated skin tear.  Topical nystatin placed, the patient is in no distress and has no abdominal tenderness, this appears to be a subacute condition that has exacerbated based on skin tension, I have reviewed the patient's medical record, she takes multiple medications including antihypertensives, Lantus for diabetes, will add nystatin topically, this patient is stable for discharge, she does not need any other stabilizing care or evaluation for this very superficial dermatologic abnormality.  She is agreeable to the plan     Final diagnoses:  Candidal intertrigo    ED Discharge Orders          Ordered    nystatin cream (MYCOSTATIN)        11/07/23 2214               Cleotilde Rogue, MD 11/07/23 2214

## 2023-11-07 NOTE — ED Notes (Signed)
 Area under left abdominal fold cleaned with NS, pat dry and cream applied, patient tolerated well.

## 2023-11-07 NOTE — ED Triage Notes (Signed)
 Pt c/o abdominal pain d/t a sore of the left side that came up around 2 week ago. Wound is draining and had odor and yeast present. Pt been putting nystatin cream with no relief.

## 2023-11-07 NOTE — Discharge Instructions (Signed)
 The skin tear that she have in your groin is because of a yeast infection, you will need to apply nystatin ointment twice a day, keep this area clean and dry, I would recommend showering at least every 48 hours and keeping this area very dry.  Please make sure that you are having your family doctor recheck this within 1 to 2 weeks

## 2023-11-08 ENCOUNTER — Encounter (HOSPITAL_COMMUNITY): Payer: Self-pay

## 2023-11-08 ENCOUNTER — Emergency Department (HOSPITAL_COMMUNITY)
Admission: EM | Admit: 2023-11-08 | Discharge: 2023-11-09 | Discharge status: Against Medical Advice | Attending: Emergency Medicine | Admitting: Emergency Medicine

## 2023-11-08 ENCOUNTER — Other Ambulatory Visit: Payer: Self-pay

## 2023-11-08 DIAGNOSIS — R1030 Lower abdominal pain, unspecified: Secondary | ICD-10-CM | POA: Diagnosis present

## 2023-11-08 DIAGNOSIS — Z5321 Procedure and treatment not carried out due to patient leaving prior to being seen by health care provider: Secondary | ICD-10-CM | POA: Diagnosis not present

## 2023-11-08 LAB — CBC
HCT: 36.4 % (ref 36.0–46.0)
Hemoglobin: 11.6 g/dL — ABNORMAL LOW (ref 12.0–15.0)
MCH: 27.9 pg (ref 26.0–34.0)
MCHC: 31.9 g/dL (ref 30.0–36.0)
MCV: 87.5 fL (ref 80.0–100.0)
Platelets: 232 K/uL (ref 150–400)
RBC: 4.16 MIL/uL (ref 3.87–5.11)
RDW: 16.1 % — ABNORMAL HIGH (ref 11.5–15.5)
WBC: 10.3 K/uL (ref 4.0–10.5)
nRBC: 0 % (ref 0.0–0.2)

## 2023-11-08 LAB — COMPREHENSIVE METABOLIC PANEL WITH GFR
ALT: <5 U/L (ref 0–44)
AST: 18 U/L (ref 15–41)
Albumin: 3.7 g/dL (ref 3.5–5.0)
Alkaline Phosphatase: 115 U/L (ref 38–126)
Anion gap: 13 (ref 5–15)
BUN: 33 mg/dL — ABNORMAL HIGH (ref 8–23)
CO2: 27 mmol/L (ref 22–32)
Calcium: 8.7 mg/dL — ABNORMAL LOW (ref 8.9–10.3)
Chloride: 96 mmol/L — ABNORMAL LOW (ref 98–111)
Creatinine, Ser: 2.16 mg/dL — ABNORMAL HIGH (ref 0.44–1.00)
GFR, Estimated: 22 mL/min — ABNORMAL LOW (ref >60)
Glucose, Bld: 104 mg/dL — ABNORMAL HIGH (ref 70–99)
Potassium: 4.3 mmol/L (ref 3.5–5.1)
Sodium: 136 mmol/L (ref 135–145)
Total Bilirubin: 0.6 mg/dL (ref 0.0–1.2)
Total Protein: 6.7 g/dL (ref 6.5–8.1)

## 2023-11-08 LAB — LIPASE, BLOOD: Lipase: 30 U/L (ref 11–51)

## 2023-11-08 NOTE — ED Triage Notes (Addendum)
 Pt reports lower abd pain that started today. Reports normal BM and urine output. Pt was seen here yesterday for abd pain as well.

## 2023-11-10 ENCOUNTER — Other Ambulatory Visit (HOSPITAL_COMMUNITY)

## 2023-11-24 ENCOUNTER — Encounter (HOSPITAL_COMMUNITY): Admission: RE | Admit: 2023-11-24

## 2023-12-01 ENCOUNTER — Ambulatory Visit: Attending: Nurse Practitioner | Admitting: Nurse Practitioner

## 2023-12-01 ENCOUNTER — Encounter: Payer: Self-pay | Admitting: Nurse Practitioner

## 2023-12-01 VITALS — BP 120/60 | HR 65 | Ht 65.0 in | Wt 198.4 lb

## 2023-12-01 DIAGNOSIS — I498 Other specified cardiac arrhythmias: Secondary | ICD-10-CM | POA: Diagnosis not present

## 2023-12-01 DIAGNOSIS — I5032 Chronic diastolic (congestive) heart failure: Secondary | ICD-10-CM

## 2023-12-01 DIAGNOSIS — Z952 Presence of prosthetic heart valve: Secondary | ICD-10-CM

## 2023-12-01 DIAGNOSIS — I050 Rheumatic mitral stenosis: Secondary | ICD-10-CM

## 2023-12-01 DIAGNOSIS — I739 Peripheral vascular disease, unspecified: Secondary | ICD-10-CM

## 2023-12-01 DIAGNOSIS — I10 Essential (primary) hypertension: Secondary | ICD-10-CM

## 2023-12-01 DIAGNOSIS — N183 Chronic kidney disease, stage 3 unspecified: Secondary | ICD-10-CM

## 2023-12-01 DIAGNOSIS — R001 Bradycardia, unspecified: Secondary | ICD-10-CM

## 2023-12-01 DIAGNOSIS — I349 Nonrheumatic mitral valve disorder, unspecified: Secondary | ICD-10-CM

## 2023-12-01 DIAGNOSIS — I480 Paroxysmal atrial fibrillation: Secondary | ICD-10-CM

## 2023-12-01 DIAGNOSIS — R29898 Other symptoms and signs involving the musculoskeletal system: Secondary | ICD-10-CM

## 2023-12-01 DIAGNOSIS — I517 Cardiomegaly: Secondary | ICD-10-CM

## 2023-12-01 DIAGNOSIS — E785 Hyperlipidemia, unspecified: Secondary | ICD-10-CM

## 2023-12-01 MED ORDER — FUROSEMIDE 20 MG PO TABS: 20.0000 mg | ORAL_TABLET | Freq: Every day | ORAL | 5 refills | Status: DC | PRN

## 2023-12-01 NOTE — Progress Notes (Signed)
 Office Visit    Patient Name: Stephanie [name] Date of Encounter: 12/01/2023 PCP:  [facility]  Cardiologist:  Jayson Sierras, MD  Advanced Practice Provider:  No care team member to display Electrophysiologist:  None   Chief Complaint and HPI    Stephanie [name] is a 88 y.o. female with a hx of severe aortic stenosis, s/p TAVR in 2022, PAF, COPD, hypertension, mixed hyperlipidemia, chronic kidney disease, hx of lung cancer (receiving tx), and presumed GI bleed in January 21, type 2 diabetes, who presents today for follow-up.  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  Last seen by Dr. Sierras on October 14, 2021.  She did admit to sharp sensation of left-sided chest discomfort that prompted ER evaluation prior to office visit.  CT of chest arranged showed an lung nodule on right lower lobe was concerning for neoplasm.  Was suggested to refer patient to [facility] pulmonary for further evaluation.  I saw her for 40-month follow-up on April 12, 2022.  Was doing well. Was receiving SBRT tx for lung cancer. Had one more treatment to go. Denied any chest pain, shortness of breath, palpitations, syncope, presyncope, dizziness, orthopnea, PND, swelling or significant weight changes, acute bleeding, or claudication.   ED visit on September 07, 2022 for evaluation of pleuritic chest pain and shortness of breath.  Denied any chest trauma.  CT of chest, abdomen, and pelvis noted below.  There was nothing acute noted however there was a right lower pulmonary nodule that has substantially enlarged from previous study was now considered a mixed solid and subsolid mass with solid component.  There was no lymphadenopathy or definite signs of metastatic disease outside of the thorax at that time.  CT also revealed evidence of colonic diverticulosis, stable left adrenal nodule felt to be likely benign lesions, hepatic  steatosis.  CXR with opacities in right lower lung, felt to be atypical infection or aspiration.  Was told to follow-up with pulmonology outpatient.  Repeat bronchoscopy and biopsy were arranged.  Underwent robotic assisted bronchoscopy on September 21, 2022.  She was noted to have acute bradycardia post anesthesia.  EKG appeared like ectopic rhythm with PACs, was found to be in sinus rhythm in 60s with PACs on telemetry.  Was not found to be in heart block.  It was suspected that bradycardia was in the setting of anesthesia.  Recommended outpatient echocardiogram and follow-up with cardiology.  It was recommended that if there was no structural heart disease, could reschedule procedure.  Echocardiogram on September 23, 2022 revealed EF 55 to 60%, severe LVH, stable function of aortic valve replacement.  09/29/2022 - Presented for hospital follow-up. Was doing well. Denied any complaints. Denied any chest pain, shortness of breath, syncope, presyncope, dizziness, orthopnea, PND, swelling or significant weight changes, acute bleeding, or claudication. BP averaging 150's at home. Did sometimes sense sensation of skipping beat, brief and rare per her report. Monitor was arranged - see report below.   11/16/2022-doing well and denies any complaints.  Denies any changes to her health since I last saw her.  She presents her BP log that shows well morning BP readings, elevated evening readings.  Family member states these readings are right after she takes her medicines and not 2 hours afterwards. Denies any chest pain, shortness of breath, palpitations, syncope, presyncope, dizziness, orthopnea, PND, swelling or significant weight changes, acute bleeding, or claudication.  08/30/2023 -since I have last seen  her, she has had several ED visits in the interim. Overall doing well today. Admits to atypical pain along right side of chest. She had a CT scan done earlier this year.  CT angio chest showed artifact versus  small nonocclusive chronic clot in right lower lobe pulmonary artery branch.  Interval increase in size of right lower lobe mass, a small acute PE was felt to be less likely, trace right pleural effusion.  She does admit to some weight gain as well as two-pillow orthopnea.  Does feel like she is having peripheral edema. Denies any chest pain, shortness of breath, palpitations, syncope, presyncope, dizziness, PND, acute bleeding, or claudication.  Since I have last seen her, she has had several hospital stays. Most recently hospitalized in October 2025 with CP, that awoke her from sleep at night. Was transferred from [facility] to [facility] d/t concern for NSTEMI. NST was done on 11/01/2023. She was r/o for ACS by her trops. NST was reassuring. Not found to be a cardiac cath candidate d/t her CKD. Was told to f/u with [facility] Cardiology.   Today she is here for follow-up. She is overall doing well since leaving the hospital.  However daughter says that she fell recently at the [employer] parking lot and had what appears to be a mechanical fall.  Denies any acute injuries/head injuries. Patient does state that she feels that her legs give out, not often per her report. Denies any chest pain, shortness of breath, palpitations, syncope, presyncope, dizziness, orthopnea, PND, swelling or significant weight changes, acute bleeding, or claudication.   ROS: See HPI. Rest of ROS negative.   EKGs/Labs/Other Studies Reviewed:   The following studies were reviewed today:   EKG: EKG is not ordered today.   Lexiscan 10/2023:    The study is normal. There are no perfusion defects.  The study is intermediate risk based purely on decreased ejection fraction, consider correlating with echocardiogram.   The study is intermediate risk.   No ST deviation was noted.   LV perfusion is normal.   Left ventricular function is abnormal. Nuclear stress EF: 37%. The left ventricular ejection fraction is moderately decreased (30-44%). End  diastolic cavity size is normal.  Limited Echo 10/2023:  1. Left ventricular ejection fraction, by estimation, is 70 to 75%. The  left ventricle has hyperdynamic function. The left ventricle has no  regional wall motion abnormalities. There is severe left ventricular  hypertrophy.   2. Right ventricular systolic function is normal. The right ventricular  size is normal.   3. The inferior vena cava is normal in size with greater than 50%  respiratory variability, suggesting right atrial pressure of 3 mmHg.   4. Limited echo evaluate LV function and wall motion.   Echo 09/2023:  1. Left ventricular ejection fraction, by estimation, is 60 to 65%. The  left ventricle has normal function. Left ventricular endocardial border  not optimally defined to evaluate regional wall motion. There is moderate  concentric left ventricular  hypertrophy. Left ventricular diastolic parameters are indeterminate.   2. Right ventricular systolic function is normal. The right ventricular  size is mildly enlarged. Tricuspid regurgitation signal is inadequate for  assessing PA pressure.   3. Right atrial size was moderately dilated.   4. The mitral valve is abnormal. No evidence of mitral valve  regurgitation. Moderate mitral stenosis. The mean mitral valve gradient is  6.0 mmHg. Severe mitral annular calcification.   5. The aortic valve has been repaired/replaced. Aortic valve  regurgitation is not visualized. No aortic stenosis is present. There is a  26 mm Sapien prosthetic (TAVR) valve present in the aortic position. Echo  findings are consistent with normal  structure and function of the aortic valve prosthesis. Aortic valve mean  gradient measures 11.0 mmHg.   6. The inferior vena cava is dilated in size with >50% respiratory  variability, suggesting right atrial pressure of 8 mmHg.   7. Increased flow velocities may be secondary to anemia, thyrotoxicosis,  hyperdynamic or high flow state.  ABI's  01/2023: Summary:  Right: Resting right ankle-brachial index indicates moderate right lower  extremity arterial disease. The right toe-brachial index is abnormal.   Left: Resting left ankle-brachial index indicates moderate left lower  extremity arterial disease. The left toe-brachial index is abnormal.  Monitor 09/2022: Predominant rhythm is sinus with prolonged PR interval.  Heart rate ranged from 41 bpm up to 101 bpm and average heart rate 67 bpm. Frequent PACs were noted representing 13.6% total beats.  There were otherwise occasional atrial couplets and rare atrial triplets. There were rare PVCs including ventricular couplets representing less than 1% total beats.  Also limited ventricular trigeminy. There were 6 episodes of NSVT, the longest of which was 8 beats. Multiple (116) episodes of PSVT were noted.  These were generally very brief, the longest of which was only 14 beats.  There were no sustained arrhythmias. No pauses or high degree heart block.  Echo 09/2022:  1. Left ventricular ejection fraction, by estimation, is 55 to 60%. The  left ventricle has normal function. The left ventricle has no regional  wall motion abnormalities. There is severe left ventricular hypertrophy.  Left ventricular diastolic parameters   are consistent with Grade I diastolic dysfunction (impaired relaxation).  Elevated left atrial pressure.   2. Right ventricular systolic function is normal. The right ventricular  size is normal. Tricuspid regurgitation signal is inadequate for assessing  PA pressure.   3. Left atrial size was mildly dilated.   4. The mitral valve is normal in structure. No evidence of mitral valve  regurgitation. No evidence of mitral stenosis.   5. Edwards Sapien 3 Ultra THV size 26 mm is in the AV position. .The aortic valve has been repaired/replaced. Aortic valve regurgitation is not visualized. No aortic stenosis is present.   6. The inferior vena cava is normal in size with  greater than 50%  respiratory variability, suggesting right atrial pressure of 3 mmHg.  Echo 06/2021:  1. Left ventricular ejection fraction, by estimation, is 50 to 55%. Left  ventricular ejection fraction by 2D MOD biplane is 51.9 %. The left  ventricle has low normal function. The left ventricle has no regional wall  motion abnormalities. There is  moderate left ventricular hypertrophy. Left ventricular diastolic  parameters are consistent with Grade I diastolic dysfunction (impaired  relaxation).   2. Right ventricular systolic function is normal. The right ventricular  size is normal. There is mildly elevated pulmonary artery systolic  pressure. The estimated right ventricular systolic pressure is 38.3 mmHg.   3. Left atrial size was moderately dilated.   4. Right atrial size was moderately dilated.   5. The mitral valve is abnormal. Trivial mitral valve regurgitation.   6. The aortic valve has been repaired/replaced. Aortic valve  regurgitation is not visualized. There is a 26 mm Sapien prosthetic (TAVR)  valve present in the aortic position. Procedure Date: 07/01/2020. Echo  findings are consistent with normal structure  and function of the  aortic valve prosthesis. Aortic valve area, by VTI  measures 1.83 cm. Aortic valve mean gradient measures 8.3 mmHg. Aortic  valve Vmax measures 2.10 m/s.   Comparison(s): Changes from prior study are noted. 08/07/2020: LVEF 45-50%,  TAVR - mean gradient 9 mmHg.  Risk Assessment/Calculations:   CHA2DS2-VASc Score = 7  This indicates a 11.2% annual risk of stroke. The patient's score is based upon: CHF History: 1 HTN History: 1 Diabetes History: 1 Stroke History: 0 Vascular Disease History: 1 Age Score: 2 Gender Score: 1   Review of Systems    All other systems reviewed and are otherwise negative except as noted above.  Physical Exam    VS:  BP 120/60   Pulse 65   Ht 5' 5 (1.651 m)   Wt 198 lb 6.4 oz (90 kg)   SpO2 97%    BMI 33.02 kg/m  , BMI Body mass index is 33.02 kg/m.  Wt Readings from Last 3 Encounters:  12/01/23 198 lb 6.4 oz (90 kg)  11/08/23 202 lb 6.4 oz (91.8 kg)  11/07/23 202 lb 6.4 oz (91.8 kg)     GEN: Obese, 88 y.o. female, in no acute distress. HEENT: normal. Neck: Supple, no JVD, carotid bruits, or masses. Cardiac: S1/S2, RRR, Grade 2/6 murmur, no rubs, no gallops. No clubbing, cyanosis, edema.  Radials/PT 2+ and equal bilaterally.  Respiratory:  Respirations regular and unlabored, clear to auscultation bilaterally. MS: No deformity or atrophy. Skin: Warm and dry, no rash. Neuro:  Strength and sensation are intact. Psych: Normal affect.  Assessment & Plan    Chronic diastolic CHF Stage C, NYHA class I-II symptoms. EF 70-75% 10/2023. Euvolemic and well compensated on exam.  Instructed her that she can take Lasix  20 mg daily as needed for swelling, shortness of breath, or weight gain.  No other medication changes at this time. Low sodium diet, fluid restriction <2L, and daily weights encouraged. Educated to contact our office for weight gain of 2 lbs overnight or 5 lbs in one week.   Bradycardia, arrhythmia Noted to have acute bradycardia while undergoing robotic assisted bronchoscopy in the past, was suspected that her bradycardia was in the setting of anesthesia. EKG appeared like ectopic rhythm with PACs, was found to be in sinus rhythm in 60s with PACs on telemetry.  Was not found to be in heart block. Monitor report noted above without any significant arrhthymias or sustained arrhthymias. Care and ED precautions discussed.   Severe AS, s/p TAVR in 2022, mitral valve stenosis TTE 09/2023 revealed stable aortic valve prosthesis function. Stable and denies any concerning symptoms. Grade 2/6 murmur noted on exam, Echo from a few months ago revealed moderate mitral stenosis with mean gradient along valve, 6 mmHg.  Denies any concerning signs or symptoms.  Continue current meds. SBE  prophylaxis discussed and pt verbalized understanding. Heart healthy diet and regular cardiovascular exercise as tolerated encouraged.   PAF Denies any tachycardia or palpitations.  HR well controlled. Continue current medication regimen. No longer on OAC d/t reported hx of bleeding. Continue carvedilol . Heart healthy diet and regular cardiovascular exercise as tolerated encouraged.   Left ventricular hypertrophy, HTN Most recent limited echo revealed severe LVH in setting of poor blood pressure control. BP well controlled in office today on recheck. SBP < 140.  Continue current medication regimen. Given BP log and discussed to monitor BP at home at least 2 hours after medications and sitting for 5-10 minutes. Heart healthy diet encouraged.   HLD Last  LDL obtained in 2022 was WNL.  Currently not on any lipid-lowering medications. Will request most recent labs from PCP. Heart healthy diet and regular cardiovascular exercise as tolerated encouraged.   CKD 3 Stable kidney function seen with most recent lab work.  Avoid nephrotoxic agents.  Encourage adequate hydration.  Continue current medication regimen. Continue follow-up with PCP.  8. PAD, leg weakness Does admit to rare occurrence of legs giving out, ABI's from 01/2023 showed some evidence of PAD. Will repeat ABI's and obtain bilateral arterial duplex for further evaluation.  Continue current medication regimen. Heart healthy diet and regular cardiovascular exercise encouraged.   Disposition: Follow up in 6 months with Jayson Sierras, MD or APP.    Signed, Almarie Crate, NP

## 2023-12-01 NOTE — Patient Instructions (Signed)
 Medication Instructions:  Your physician has recommended you make the following change in your medication: Start taking Lasix  20 mg as daily as needed for swelling, shortness of breath or weight gain Continue taking all other medications as prescribed    Labwork: None  Testing/Procedures: Your physician has requested that you have an ankle brachial index (ABI). During this test an ultrasound and blood pressure cuff are used to evaluate the arteries that supply the arms and legs with blood. Allow thirty minutes for this exam. There are no restrictions or special instructions.  Please note: We ask at that you not bring children with you during ultrasound (echo/ vascular) testing. Due to room size and safety concerns, children are not allowed in the ultrasound rooms during exams. Our front office staff cannot provide observation of children in our lobby area while testing is being conducted. An adult accompanying a patient to their appointment will only be allowed in the ultrasound room at the discretion of the ultrasound technician under special circumstances. We apologize for any inconvenience.  Your physician has requested that you have a lower or upper extremity arterial duplex. This test is an ultrasound of the arteries in the legs or arms. It looks at arterial blood flow in the legs and arms. Allow one hour for Lower and Upper Arterial scans. There are no restrictions or special instructions.  Please note: We ask at that you not bring children with you during ultrasound (echo/ vascular) testing. Due to room size and safety concerns, children are not allowed in the ultrasound rooms during exams. Our front office staff cannot provide observation of children in our lobby area while testing is being conducted. An adult accompanying a patient to their appointment will only be allowed in the ultrasound room at the discretion of the ultrasound technician under special circumstances. We apologize for any  inconvenience.   Follow-Up: Your physician recommends that you schedule a follow-up appointment in: 6 months with Dr. Debera  Any Other Special Instructions Will Be Listed Below (If Applicable). Thank you for choosing [facility]!     If you need a refill on your cardiac medications before your next appointment, please call your pharmacy.

## 2023-12-08 ENCOUNTER — Ambulatory Visit (HOSPITAL_COMMUNITY): Admission: RE | Admit: 2023-12-08 | Source: Ambulatory Visit

## 2023-12-21 ENCOUNTER — Emergency Department (HOSPITAL_COMMUNITY)
Admission: EM | Admit: 2023-12-21 | Discharge: 2023-12-21 | Disposition: A | Discharge status: Home / Self Care | Attending: Emergency Medicine | Admitting: Emergency Medicine

## 2023-12-21 ENCOUNTER — Ambulatory Visit

## 2023-12-21 ENCOUNTER — Emergency Department (HOSPITAL_COMMUNITY)

## 2023-12-21 ENCOUNTER — Other Ambulatory Visit: Payer: Self-pay

## 2023-12-21 ENCOUNTER — Encounter (HOSPITAL_COMMUNITY): Payer: Self-pay

## 2023-12-21 DIAGNOSIS — W19XXXA Unspecified fall, initial encounter: Secondary | ICD-10-CM

## 2023-12-21 DIAGNOSIS — R1084 Generalized abdominal pain: Secondary | ICD-10-CM

## 2023-12-21 DIAGNOSIS — R112 Nausea with vomiting, unspecified: Secondary | ICD-10-CM

## 2023-12-21 LAB — URINALYSIS, ROUTINE W REFLEX MICROSCOPIC
Bilirubin Urine: NEGATIVE
Glucose, UA: NEGATIVE mg/dL
Hgb urine dipstick: NEGATIVE
Ketones, ur: NEGATIVE mg/dL
Nitrite: NEGATIVE
Protein, ur: 100 mg/dL — AB
Specific Gravity, Urine: >1.046 — ABNORMAL HIGH (ref 1.005–1.030)
pH: 6 (ref 5.0–8.0)

## 2023-12-21 LAB — COMPREHENSIVE METABOLIC PANEL WITH GFR
ALT: 8 U/L (ref 0–44)
AST: 22 U/L (ref 15–41)
Albumin: 4.2 g/dL (ref 3.5–5.0)
Alkaline Phosphatase: 127 U/L — ABNORMAL HIGH (ref 38–126)
Anion gap: 14 (ref 5–15)
BUN: 16 mg/dL (ref 8–23)
CO2: 24 mmol/L (ref 22–32)
Calcium: 9.3 mg/dL (ref 8.9–10.3)
Chloride: 101 mmol/L (ref 98–111)
Creatinine, Ser: 1.23 mg/dL — ABNORMAL HIGH (ref 0.44–1.00)
GFR, Estimated: 43 mL/min — ABNORMAL LOW (ref >60)
Glucose, Bld: 125 mg/dL — ABNORMAL HIGH (ref 70–99)
Potassium: 4.1 mmol/L (ref 3.5–5.1)
Sodium: 138 mmol/L (ref 135–145)
Total Bilirubin: 0.7 mg/dL (ref 0.0–1.2)
Total Protein: 7.1 g/dL (ref 6.5–8.1)

## 2023-12-21 LAB — CBC WITH DIFFERENTIAL/PLATELET
Abs Immature Granulocytes: 0.02 K/uL (ref 0.00–0.07)
Basophils Absolute: 0 K/uL (ref 0.0–0.1)
Basophils Relative: 0 %
Eosinophils Absolute: 0.2 K/uL (ref 0.0–0.5)
Eosinophils Relative: 2 %
HCT: 42.8 % (ref 36.0–46.0)
Hemoglobin: 13.6 g/dL (ref 12.0–15.0)
Immature Granulocytes: 0 %
Lymphocytes Relative: 12 %
Lymphs Abs: 1.1 K/uL (ref 0.7–4.0)
MCH: 27.3 pg (ref 26.0–34.0)
MCHC: 31.8 g/dL (ref 30.0–36.0)
MCV: 85.9 fL (ref 80.0–100.0)
Monocytes Absolute: 0.8 K/uL (ref 0.1–1.0)
Monocytes Relative: 8 %
Neutro Abs: 7.1 K/uL (ref 1.7–7.7)
Neutrophils Relative %: 78 %
Platelets: 212 K/uL (ref 150–400)
RBC: 4.98 MIL/uL (ref 3.87–5.11)
RDW: 15.9 % — ABNORMAL HIGH (ref 11.5–15.5)
WBC: 9.3 K/uL (ref 4.0–10.5)
nRBC: 0 % (ref 0.0–0.2)

## 2023-12-21 LAB — LIPASE, BLOOD: Lipase: 40 U/L (ref 11–51)

## 2023-12-21 MED ORDER — IOHEXOL 300 MG/ML  SOLN
80.0000 mL | Freq: Once | INTRAMUSCULAR | Status: AC | PRN
  Administered 2023-12-21: 80 mL via INTRAVENOUS

## 2023-12-21 MED ORDER — ONDANSETRON HCL 4 MG PO TABS: 4.0000 mg | ORAL_TABLET | Freq: Four times a day (QID) | ORAL | 0 refills | Status: DC

## 2023-12-21 NOTE — Discharge Instructions (Addendum)
 You have been prescribed medication to help with your nausea vomiting.  As we discussed, bland diet as tolerated.  I recommend that you use an over-the-counter 4% lidocaine  patch as directed to your back or hip area.  You may buy these without a prescription.  Over-the-counter Tylenol  to help with your pain.  Please follow-up with your primary care provider for recheck or return to emergency department for any new or worsening symptoms.

## 2023-12-21 NOTE — ED Triage Notes (Signed)
 Pt BIB ems from home. Pt had a fall two weeks ago and is experiencing left lower back pain/ hip. Pt experiencing dizziness today and nausea. CBG 174, vs WDL.

## 2023-12-21 NOTE — ED Provider Notes (Signed)
 [place] EMERGENCY DEPARTMENT AT [facility] Provider Note   CSN: [id number] Arrival date & time: 12/21/23  9071     Patient presents with: [place]   Stephanie [name] is a 88 y.o. female.  {Add pertinent medical, surgical, social history, OB history to YEP:67052}  Fall        Stephanie [name] is a 88 y.o. female past med history of asthma, anemia, type 2 diabetes, hypertension, heart murmur, CKD, paroxysmal atrial fibrillation and COPD.  She does not take blood thinners.  she presents to the Emergency Department complaining of low back, left hip pain and abdominal pain.  She endorses a mechanical fall 2 weeks ago that occurred while she was stepping up into a truck.  States that her foot slipped and she fell backwards landing on her buttock and left hip area.  She has had ongoing pain since that time.  She uses a roller walker at baseline.  Her pain is worse with standing and weightbearing.  Improves somewhat at rest.  Today, she notes experiencing some nausea and had 2 episodes of vomiting this morning.  She is having some pain and fullness of her lower abdomen.  She believes that her abdominal pain is secondary to her fall as well.  She denies any fevers, chills, bowel changes, chest pain or shortness of breath.  No head injury,neck pain or loss of consciousness.      Prior to Admission medications   Medication Sig Start Date End Date Taking? Authorizing Provider  albuterol  (PROVENTIL ) (2.5 MG/3ML) 0.083% nebulizer solution Take 3 mLs (2.5 mg total) by nebulization every 2 (two) hours as needed for wheezing or shortness of breath. 08/12/23   Pearlean Manus, MD  amLODipine  (NORVASC ) 2.5 MG tablet Take 1 tablet (2.5 mg total) by mouth daily. 09/26/23   Evonnie Lenis, MD  carvedilol  (COREG ) 6.25 MG tablet Take 6.25 mg by mouth 2 (two) times daily with a meal.    [provider]  cetirizine (ZYRTEC) 10 MG tablet Take 10 mg by mouth at bedtime.    [provider]   cinacalcet  (SENSIPAR ) 30 MG tablet Take 30 mg by mouth daily. 03/31/23   [provider]  FEROSUL 325 (65 Fe) MG tablet Take 325 mg by mouth daily.    [provider]  furosemide  (LASIX ) 20 MG tablet Take 1 tablet (20 mg total) by mouth daily as needed for edema. Shortness of breath and weight gain 12/01/23   Miriam Norris, NP  gabapentin  (NEURONTIN ) 100 MG capsule Take 100-200 mg by mouth at bedtime. 10/02/19   [provider]  GNP IRON 200 (65 Fe) MG TABS Take 1 tablet by mouth daily. 11/23/23   [provider]  GNP VITAMIN D3 EXTRA STRENGTH 25 MCG (1000 UT) tablet Take 1,000 Units by mouth daily. 03/31/23   [provider]  hydrALAZINE  (APRESOLINE ) 50 MG tablet Take 1 tablet (50 mg total) by mouth 3 (three) times daily. 08/12/23   Pearlean Manus, MD  isosorbide  mononitrate (IMDUR ) 30 MG 24 hr tablet Take 1 tablet (30 mg total) by mouth daily. 08/12/23 08/11/24  Pearlean Manus, MD  LANTUS SOLOSTAR 100 UNIT/ML Solostar Pen Inject 40 Units into the skin at bedtime.    [provider]  nystatin  cream (MYCOSTATIN ) Apply to affected area 2 times daily 11/07/23   Cleotilde Rogue, MD  pantoprazole  (PROTONIX ) 20 MG tablet Take 1 tablet (20 mg total) by mouth daily. 10/18/23 01/16/24  Melvenia Motto, MD  predniSONE  (DELTASONE )  10 MG tablet Take 1 tablet (10 mg total) by mouth daily with breakfast. And decrease by 1 tablet daily 11/01/23   Vicci, Clanford L, MD  sucralfate  (CARAFATE ) 1 GM/10ML suspension Take 10 mLs (1 g total) by mouth with breakfast, with lunch, and with evening meal. 10/18/23   Melvenia Motto, MD  Tiotropium Bromide-Olodaterol (STIOLTO RESPIMAT ) 2.5-2.5 MCG/ACT AERS Inhale 2 puffs into the lungs daily. 09/21/23   Shelah Lamar RAMAN, MD    Allergies: Amoxicillin, Nitrofurantoin, Penicillins, and Sulfa antibiotics    Review of Systems  Updated Vital Signs Temp 98.7 F (37.1 C) (Oral)   Ht 5' 5 (1.651 m)   Wt 90.7 kg   BMI 33.28 kg/m    Physical Exam  (all labs ordered are listed, but only abnormal results are displayed) Labs Reviewed - No data to display  EKG: None  Radiology: No results found.  {Document cardiac monitor, telemetry assessment procedure when appropriate:32947} Procedures   Medications Ordered in the ED - No data to display    {Click here for ABCD2, HEART and other calculators REFRESH Note before signing:1}                              Medical Decision Making Amount and/or Complexity of Data Reviewed Labs: ordered. Radiology: ordered. Discussion of management or test interpretation with external provider(s):   On recheck, patient resting comfortably.  She has tolerated oral fluid challenge without difficulty.  No vomiting during her ER stay.  Patient was also seen by Dr. Jakie and care plan discussed.  Her workup today without emergent findings.  X-rays without evidence of acute fractures or dislocations.  She is ambulatory and neurovascularly intact.  Will treat her nausea vomiting symptoms and she is agreeable to close outpatient follow-up with her PCP.  Return precautions were also given.  Risk Prescription drug management.     {Document critical care time when appropriate  Document review of labs and clinical decision tools ie CHADS2VASC2, etc  Document your independent review of radiology images and any outside records  Document your discussion with family members, caretakers and with consultants  Document social determinants of health affecting pt's care  Document your decision making why or why not admission, treatments were needed:32947:::1}   Final diagnoses:  None    ED Discharge Orders     None

## 2023-12-22 ENCOUNTER — Encounter (HOSPITAL_COMMUNITY): Admission: RE | Admit: 2023-12-22

## 2023-12-24 ENCOUNTER — Other Ambulatory Visit: Payer: Self-pay

## 2023-12-24 ENCOUNTER — Observation Stay (HOSPITAL_COMMUNITY)
Admission: EM | Admit: 2023-12-24 | Discharge: 2023-12-28 | DRG: 314 | Disposition: A | Attending: Hospitalist | Admitting: Hospitalist

## 2023-12-24 ENCOUNTER — Encounter (HOSPITAL_COMMUNITY): Payer: Self-pay | Admitting: Family Medicine

## 2023-12-24 ENCOUNTER — Emergency Department (HOSPITAL_COMMUNITY)

## 2023-12-24 DIAGNOSIS — Z952 Presence of prosthetic heart valve: Secondary | ICD-10-CM

## 2023-12-24 DIAGNOSIS — E871 Hypo-osmolality and hyponatremia: Secondary | ICD-10-CM | POA: Diagnosis not present

## 2023-12-24 DIAGNOSIS — E119 Type 2 diabetes mellitus without complications: Secondary | ICD-10-CM

## 2023-12-24 DIAGNOSIS — I480 Paroxysmal atrial fibrillation: Secondary | ICD-10-CM | POA: Diagnosis present

## 2023-12-24 DIAGNOSIS — A419 Sepsis, unspecified organism: Secondary | ICD-10-CM

## 2023-12-24 DIAGNOSIS — E1122 Type 2 diabetes mellitus with diabetic chronic kidney disease: Secondary | ICD-10-CM

## 2023-12-24 DIAGNOSIS — B952 Enterococcus as the cause of diseases classified elsewhere: Secondary | ICD-10-CM | POA: Diagnosis present

## 2023-12-24 DIAGNOSIS — I5032 Chronic diastolic (congestive) heart failure: Secondary | ICD-10-CM | POA: Diagnosis present

## 2023-12-24 DIAGNOSIS — N3000 Acute cystitis without hematuria: Principal | ICD-10-CM

## 2023-12-24 DIAGNOSIS — I330 Acute and subacute infective endocarditis: Secondary | ICD-10-CM

## 2023-12-24 DIAGNOSIS — J449 Chronic obstructive pulmonary disease, unspecified: Secondary | ICD-10-CM | POA: Diagnosis present

## 2023-12-24 DIAGNOSIS — N390 Urinary tract infection, site not specified: Secondary | ICD-10-CM | POA: Diagnosis present

## 2023-12-24 LAB — CBC
HCT: 37.9 % (ref 36.0–46.0)
Hemoglobin: 12.4 g/dL (ref 12.0–15.0)
MCH: 27.7 pg (ref 26.0–34.0)
MCHC: 32.7 g/dL (ref 30.0–36.0)
MCV: 84.6 fL (ref 80.0–100.0)
Platelets: 175 K/uL (ref 150–400)
RBC: 4.48 MIL/uL (ref 3.87–5.11)
RDW: 15.6 % — ABNORMAL HIGH (ref 11.5–15.5)
WBC: 16.7 K/uL — ABNORMAL HIGH (ref 4.0–10.5)
nRBC: 0 % (ref 0.0–0.2)

## 2023-12-24 LAB — URINALYSIS, ROUTINE W REFLEX MICROSCOPIC
Bilirubin Urine: NEGATIVE
Glucose, UA: NEGATIVE mg/dL
Hgb urine dipstick: NEGATIVE
Ketones, ur: NEGATIVE mg/dL
Nitrite: NEGATIVE
Protein, ur: >=300 mg/dL — AB
Specific Gravity, Urine: 1.019 (ref 1.005–1.030)
pH: 6 (ref 5.0–8.0)

## 2023-12-24 LAB — COMPREHENSIVE METABOLIC PANEL WITH GFR
ALT: 7 U/L (ref 0–44)
AST: 21 U/L (ref 15–41)
Albumin: 3.7 g/dL (ref 3.5–5.0)
Alkaline Phosphatase: 116 U/L (ref 38–126)
Anion gap: 13 (ref 5–15)
BUN: 16 mg/dL (ref 8–23)
CO2: 22 mmol/L (ref 22–32)
Calcium: 8.9 mg/dL (ref 8.9–10.3)
Chloride: 96 mmol/L — ABNORMAL LOW (ref 98–111)
Creatinine, Ser: 1.49 mg/dL — ABNORMAL HIGH (ref 0.44–1.00)
GFR, Estimated: 34 mL/min — ABNORMAL LOW (ref >60)
Glucose, Bld: 162 mg/dL — ABNORMAL HIGH (ref 70–99)
Potassium: 4.4 mmol/L (ref 3.5–5.1)
Sodium: 131 mmol/L — ABNORMAL LOW (ref 135–145)
Total Bilirubin: 1.2 mg/dL (ref 0.0–1.2)
Total Protein: 6.5 g/dL (ref 6.5–8.1)

## 2023-12-24 LAB — PROTIME-INR
INR: 1 (ref 0.8–1.2)
Prothrombin Time: 13.6 s (ref 11.4–15.2)

## 2023-12-24 LAB — RESP PANEL BY RT-PCR (RSV, FLU A&B, COVID)  RVPGX2
Influenza A by PCR: NEGATIVE
Influenza B by PCR: NEGATIVE
Resp Syncytial Virus by PCR: NEGATIVE
SARS Coronavirus 2 by RT PCR: NEGATIVE

## 2023-12-24 LAB — GLUCOSE, CAPILLARY: Glucose-Capillary: 150 mg/dL — ABNORMAL HIGH (ref 70–99)

## 2023-12-24 LAB — CBG MONITORING, ED: Glucose-Capillary: 190 mg/dL — ABNORMAL HIGH (ref 70–99)

## 2023-12-24 LAB — LACTIC ACID, PLASMA: Lactic Acid, Venous: 1.2 mmol/L (ref 0.5–1.9)

## 2023-12-24 MED ORDER — INSULIN ASPART 100 UNIT/ML IJ SOLN
0.0000 [IU] | Freq: Three times a day (TID) | INTRAMUSCULAR | Status: DC
  Administered 2023-12-26: 1 [IU] via SUBCUTANEOUS
  Filled 2023-12-24: qty 1

## 2023-12-24 MED ORDER — SODIUM CHLORIDE 0.9 % IV SOLN
1.0000 g | Freq: Once | INTRAVENOUS | Status: AC
  Administered 2023-12-24: 1 g via INTRAVENOUS
  Filled 2023-12-24: qty 10

## 2023-12-24 MED ORDER — SODIUM CHLORIDE 0.9 % IV SOLN: INTRAVENOUS | Status: AC

## 2023-12-24 MED ORDER — SODIUM CHLORIDE 0.9 % IV SOLN: 2.0000 g | INTRAVENOUS | Status: DC

## 2023-12-24 MED ORDER — SODIUM CHLORIDE 0.9% FLUSH
3.0000 mL | Freq: Two times a day (BID) | INTRAVENOUS | Status: DC
  Administered 2023-12-24 – 2023-12-28 (×6): 3 mL via INTRAVENOUS

## 2023-12-24 MED ORDER — ONDANSETRON HCL 4 MG PO TABS: 4.0000 mg | ORAL_TABLET | Freq: Four times a day (QID) | ORAL | Status: DC | PRN

## 2023-12-24 MED ORDER — ISOSORBIDE MONONITRATE ER 30 MG PO TB24
30.0000 mg | ORAL_TABLET | Freq: Every day | ORAL | Status: DC
  Administered 2023-12-24 – 2023-12-28 (×5): 30 mg via ORAL
  Filled 2023-12-24 (×5): qty 1

## 2023-12-24 MED ORDER — HEPARIN SODIUM (PORCINE) 5000 UNIT/ML IJ SOLN
5000.0000 [IU] | Freq: Three times a day (TID) | INTRAMUSCULAR | Status: DC
  Administered 2023-12-24 – 2023-12-28 (×12): 5000 [IU] via SUBCUTANEOUS
  Filled 2023-12-24 (×12): qty 1

## 2023-12-24 MED ORDER — HYDRALAZINE HCL 50 MG PO TABS
50.0000 mg | ORAL_TABLET | Freq: Three times a day (TID) | ORAL | Status: DC
  Administered 2023-12-24 – 2023-12-28 (×11): 50 mg via ORAL
  Filled 2023-12-24 (×11): qty 1

## 2023-12-24 MED ORDER — CARVEDILOL 3.125 MG PO TABS
6.2500 mg | ORAL_TABLET | Freq: Two times a day (BID) | ORAL | Status: DC
  Administered 2023-12-25 – 2023-12-28 (×7): 6.25 mg via ORAL
  Filled 2023-12-24 (×8): qty 2

## 2023-12-24 MED ORDER — BISACODYL 10 MG RE SUPP: 10.0000 mg | Freq: Every day | RECTAL | Status: DC | PRN

## 2023-12-24 MED ORDER — GABAPENTIN 100 MG PO CAPS
100.0000 mg | ORAL_CAPSULE | Freq: Every day | ORAL | Status: DC
  Administered 2023-12-24 – 2023-12-27 (×4): 100 mg via ORAL
  Filled 2023-12-24 (×4): qty 1

## 2023-12-24 MED ORDER — ONDANSETRON HCL 4 MG/2ML IJ SOLN
4.0000 mg | Freq: Four times a day (QID) | INTRAMUSCULAR | Status: DC | PRN
  Administered 2023-12-28: 4 mg via INTRAVENOUS
  Filled 2023-12-24: qty 2

## 2023-12-24 MED ORDER — POLYETHYLENE GLYCOL 3350 17 G PO PACK: 17.0000 g | PACK | Freq: Every day | ORAL | Status: DC | PRN

## 2023-12-24 MED ORDER — SODIUM CHLORIDE 0.9 % IV SOLN: INTRAVENOUS | Status: AC | PRN

## 2023-12-24 MED ORDER — LABETALOL HCL 5 MG/ML IV SOLN: 10.0000 mg | INTRAVENOUS | Status: DC | PRN

## 2023-12-24 MED ORDER — SODIUM CHLORIDE 0.9% FLUSH: 3.0000 mL | INTRAVENOUS | Status: DC | PRN

## 2023-12-24 MED ORDER — ACETAMINOPHEN 325 MG PO TABS
650.0000 mg | ORAL_TABLET | Freq: Four times a day (QID) | ORAL | Status: DC | PRN
  Administered 2023-12-25 – 2023-12-26 (×2): 650 mg via ORAL
  Filled 2023-12-24 (×3): qty 2

## 2023-12-24 MED ORDER — ACETAMINOPHEN 500 MG PO TABS
1000.0000 mg | ORAL_TABLET | Freq: Once | ORAL | Status: AC
  Administered 2023-12-24: 1000 mg via ORAL
  Filled 2023-12-24: qty 2

## 2023-12-24 MED ORDER — TRAZODONE HCL 50 MG PO TABS: 50.0000 mg | ORAL_TABLET | Freq: Every evening | ORAL | Status: DC | PRN

## 2023-12-24 MED ORDER — FERROUS SULFATE 325 (65 FE) MG PO TABS
325.0000 mg | ORAL_TABLET | Freq: Every day | ORAL | Status: DC
  Administered 2023-12-25 – 2023-12-28 (×2): 325 mg via ORAL
  Filled 2023-12-24 (×4): qty 1

## 2023-12-24 MED ORDER — SODIUM CHLORIDE 0.9% FLUSH
3.0000 mL | Freq: Two times a day (BID) | INTRAVENOUS | Status: DC
  Administered 2023-12-24 – 2023-12-28 (×7): 3 mL via INTRAVENOUS

## 2023-12-24 MED ORDER — AMLODIPINE BESYLATE 5 MG PO TABS
2.5000 mg | ORAL_TABLET | Freq: Every day | ORAL | Status: DC
  Administered 2023-12-24 – 2023-12-28 (×5): 2.5 mg via ORAL
  Filled 2023-12-24 (×5): qty 1

## 2023-12-24 MED ORDER — ACETAMINOPHEN 650 MG RE SUPP: 650.0000 mg | Freq: Four times a day (QID) | RECTAL | Status: DC | PRN

## 2023-12-24 MED ORDER — CINACALCET HCL 30 MG PO TABS
30.0000 mg | ORAL_TABLET | Freq: Every day | ORAL | Status: DC
  Administered 2023-12-25 – 2023-12-28 (×4): 30 mg via ORAL
  Filled 2023-12-24 (×4): qty 1

## 2023-12-24 MED ORDER — INSULIN ASPART 100 UNIT/ML IJ SOLN: 0.0000 [IU] | Freq: Every day | INTRAMUSCULAR | Status: DC

## 2023-12-24 MED ORDER — ALBUTEROL SULFATE (2.5 MG/3ML) 0.083% IN NEBU: 2.5000 mg | INHALATION_SOLUTION | RESPIRATORY_TRACT | Status: DC | PRN

## 2023-12-24 NOTE — ED Notes (Signed)
 Blood labs drawn:  blue, gold, green x2, purple.

## 2023-12-24 NOTE — ED Triage Notes (Signed)
 BIB ems from home with complaints of n/v fever and weakness that started last night. Denies pain  EMS vitals  Temp 101 91% RA and 99% on 2l

## 2023-12-24 NOTE — H&P (Signed)
 History and Physical    Patient: Stephanie [name] FMW:[id number] DOB: [birth year] DOA: 12/24/2023 DOS: the patient was seen and examined on 12/24/2023 PCP: [facility]  Patient coming from: Home  Chief Complaint:  Chief Complaint  Patient presents with   Fever   HPI: Stephanie [name] is a 88 y.o. female who is a reformed smoker with medical history significant for CKD 4, COPD, status post-TAVR, DM 2, HTN and HLD as well as h/o  paroxysmal A-fib presents to the ED with fevers above 102, generalized weakness, malaise, fatigue for the last 24 hours - Patient denies any URI symptoms -She was vomiting and having nausea for about a week but fever as noted above started about 24 hours ago - Reports urinary frequency but no dysuria or flank pain =-Reports poor appetite and poor oral intake over the last couple days - No chest pain palpitations no dizziness no increased dyspnea - Chest x-ray without acute findings --Na 131, K-4.4, Creatinine 1.49, LFTs WNL WBC 16.7, lactic acid is 1.2 Hgb 12.4/HCT 37.9, platelets 175 - COVID flu and influenza negative - UA suggestive of UTI  Review of Systems: As mentioned in the history of present illness. All other systems reviewed and are negative. 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 Wonda and Dr. Lucas.   Severe aortic stenosis    Symptomatic anemia    Presumed GI bleed January 2021 - she declined GI work-up   Type 2 diabetes mellitus [facility])    Past Surgical History:  Procedure Laterality Date   APPENDECTOMY     BACK SURGERY     BRONCHIAL BIOPSY  03/02/2022   Procedure:  BRONCHIAL BIOPSIES;  Surgeon: Brenna Adine LITTIE, DO;  Location: [facility];  Service: Pulmonary;;   BRONCHIAL BRUSHINGS  03/02/2022   Procedure: BRONCHIAL BRUSHINGS;  Surgeon: Brenna Adine LITTIE, DO;  Location: [facility];  Service: Pulmonary;;   BRONCHIAL NEEDLE ASPIRATION BIOPSY  03/02/2022   Procedure: BRONCHIAL NEEDLE ASPIRATION BIOPSIES;  Surgeon: Brenna Adine LITTIE, DO;  Location: [facility];  Service: Pulmonary;;   CYST REMOVAL HAND     Right, hospital in [place]   FIDUCIAL MARKER PLACEMENT  03/02/2022   Procedure: FIDUCIAL MARKER PLACEMENT;  Surgeon: Brenna Adine LITTIE, DO;  Location: [facility];  Service: Pulmonary;;   INTRAOPERATIVE TRANSTHORACIC ECHOCARDIOGRAM Left 07/01/2020   Procedure: INTRAOPERATIVE TRANSTHORACIC ECHOCARDIOGRAM;  Surgeon: Wonda Sharper, MD;  Location: [facility] OR;  Service: Open Heart Surgery;  Laterality: Left;   KNEE ARTHROSCOPY WITH MEDIAL MENISECTOMY Right 02/28/2012   Procedure: KNEE ARTHROSCOPY WITH MEDIAL MENISECTOMY;  Surgeon: Taft FORBES Minerva, MD;  Location: [facility];  Service: Orthopedics;  Laterality: Right;   REPAIR KNEE LIGAMENT     RIGHT/LEFT HEART CATH AND CORONARY ANGIOGRAPHY N/A 04/10/2020   Procedure: RIGHT/LEFT HEART CATH AND CORONARY ANGIOGRAPHY;  Surgeon: Dann Candyce RAMAN, MD;  Location: [facility] INVASIVE CV LAB;  Service: Cardiovascular;  Laterality: N/A;   TRANSCATHETER AORTIC VALVE REPLACEMENT, TRANSFEMORAL N/A 07/01/2020   Procedure: TRANSCATHETER AORTIC VALVE REPLACEMENT, TRANSFEMORAL;  Surgeon: Wonda Sharper, MD;  Location: [facility] OR;  Service:  Open Heart Surgery;  Laterality: N/A;   ULTRASOUND GUIDANCE FOR VASCULAR ACCESS Bilateral 07/01/2020   Procedure: ULTRASOUND GUIDANCE FOR VASCULAR ACCESS;  Surgeon: Wonda Sharper, MD;  Location: [facility] OR;  Service: Open Heart Surgery;  Laterality: Bilateral;   Social History:  reports that she has quit smoking. Her smoking use included cigarettes. She has never been exposed to tobacco smoke. She has never used  smokeless tobacco. She reports that she does not drink alcohol and does not use drugs.  Allergies  Allergen Reactions   Amoxicillin Swelling    Tongue swelling    Nitrofurantoin Nausea And Vomiting   Penicillins Swelling   Sulfa Antibiotics Swelling    Lip swelling    Family History  Problem Relation Age of Onset   Lung disease Other    Cancer Other    Arthritis Other    Asthma Other    Diabetes Other    Diabetes Mother    Hypertension Mother    Diverticulitis Mother    Diabetes Father    Hypertension Sister    Diabetes Brother    Colon cancer Neg Hx     Prior to Admission medications   Medication Sig Start Date End Date Taking? Authorizing Provider  albuterol  (PROVENTIL ) (2.5 MG/3ML) 0.083% nebulizer solution Take 3 mLs (2.5 mg total) by nebulization every 2 (two) hours as needed for wheezing or shortness of breath. 08/12/23   Pearlean Manus, MD  amLODipine  (NORVASC ) 2.5 MG tablet Take 1 tablet (2.5 mg total) by mouth daily. 09/26/23   Evonnie Lenis, MD  carvedilol  (COREG ) 6.25 MG tablet Take 6.25 mg by mouth 2 (two) times daily with a meal.    [provider]  cinacalcet  (SENSIPAR ) 30 MG tablet Take 30 mg by mouth daily. 03/31/23   [provider]  FEROSUL 325 (65 Fe) MG tablet Take 325 mg by mouth daily.    [provider]  furosemide  (LASIX ) 20 MG tablet Take 1 tablet (20 mg total) by mouth daily as needed for edema. Shortness of breath and weight gain 12/01/23   Miriam Norris, NP  gabapentin  (NEURONTIN ) 100 MG capsule Take 100-200 mg by mouth at bedtime. 10/02/19   [provider]  hydrALAZINE  (APRESOLINE ) 50 MG tablet Take 1 tablet (50 mg total) by mouth 3 (three) times daily. 08/12/23   Pearlean Manus, MD  isosorbide  mononitrate (IMDUR ) 30 MG 24 hr tablet Take 1 tablet (30 mg total) by mouth daily. 08/12/23 08/11/24  Pearlean Manus, MD  ondansetron  (ZOFRAN ) 4 MG tablet Take 1 tablet (4 mg total) by mouth every 6 (six) hours. As needed  for nausea vomiting 12/21/23   Herlinda Milling, PA-C    Physical Exam: Vitals:   12/24/23 1715 12/24/23 1730 12/24/23 1745 12/24/23 1800  BP: (!) 147/68 (!) 174/65 (!) 143/65 (!) 145/71  Pulse: 63 69 66 (!) 57  Resp: (!) 23 19 20 20   Temp:    99.2 F (37.3 C)  TempSrc:    Oral  SpO2: 94% 96% 94% 96%  Weight:      Height:        Physical Exam Gen:- Awake Alert, in no acute distress  HEENT:- [place].AT, No sclera icterus Neck-Supple Neck,No JVD,.  Lungs-  CTAB , fair air movement bilaterally  CV- S1, S2 normal, RRR Abd-  +ve B.Sounds, Abd Soft, No tenderness, no CVA area tenderness Extremity/Skin:- No significant edema,   good pedal pulses  Psych-affect is appropriate, oriented x3 Neuro-generalized weakness, no new focal deficits, no tremors  Data Reviewed: - Chest x-ray without acute findings --Na 131, K-4.4, Creatinine 1.49, LFTs WNL WBC 16.7, lactic acid is 1.2, Hgb 12.4/HCT 37.9, platelets 175 - COVID flu and influenza negative - UA suggestive of UTI  Assessment and Plan: 1)Sepsis secondary to UTI--- with Leukocytosis, Fevers above 102 and generalized weakness and fatigue -WBC 16.7, lactic acid is 1.2  -c/n Iv Rocephin  pending culture data Iv Fluids - 2) CKD stage IV--- renal function appears stable -renally adjust medications, avoid nephrotoxic agents / dehydration  / hypotension  3)COPD/reformed smoker --no acute exacerbation at this time continue as needed bronchodilators  4)DM2-A1c 6.5 reflecting excellent diabetic control PTA Use Novolog /Humalog Sliding scale insulin  with Accu-Cheks/Fingersticks as ordered   5)HTN--stable continue amlodipine , Coreg  and isosorbide /hydralazine  combo - May use IV labetalol  as needed elevated BP  6)Generalized weakness--- patient reports generalized weakness, malaise, fatigue for the last 24 hours -She was vomiting and having nausea for about a week but fever as noted above started about 24 hours ago - Reports urinary frequency but  no dysuria or flank pain =-Reports poor appetite and poor oral intake over the last couple days - IV fluids and supportive care as ordered   Advance Care Planning:   Code Status: Full Code    Severity of Illness: The appropriate patient status for this patient is OBSERVATION. Observation status is judged to be reasonable and necessary in order to provide the required intensity of service to ensure the patient's safety. The patient's presenting symptoms, physical exam findings, and initial radiographic and laboratory data in the context of their medical condition is felt to place them at decreased risk for further clinical deterioration. Furthermore, it is anticipated that the patient will be medically stable for discharge from the hospital within 2 midnights of admission.   Author: Rendall Carwin, MD 12/24/2023 7:27 PM  For on call review [email or web address].

## 2023-12-24 NOTE — ED Provider Notes (Signed)
 [place] EMERGENCY DEPARTMENT AT [facility] Provider Note   CSN: [id number] Arrival date & time: 12/24/23  1417     Patient presents with: Fever   Stephanie [name] is a 88 y.o. female.   Patient to ED from home with nausea and vomiting for a week, fever and weakness starting last night. No diarrhea, cough, SOB, chest pain. She denies abdominal pain or urinary symptoms. Her appetite is less but she reports drinking well.   The history is provided by the patient. No language interpreter was used.  Fever      Prior to Admission medications   Medication Sig Start Date End Date Taking? Authorizing Provider  albuterol  (PROVENTIL ) (2.5 MG/3ML) 0.083% nebulizer solution Take 3 mLs (2.5 mg total) by nebulization every 2 (two) hours as needed for wheezing or shortness of breath. 08/12/23   Pearlean, Courage, MD  amLODipine  (NORVASC ) 2.5 MG tablet Take 1 tablet (2.5 mg total) by mouth daily. 09/26/23   Evonnie Lenis, MD  carvedilol  (COREG ) 6.25 MG tablet Take 6.25 mg by mouth 2 (two) times daily with a meal.    [provider]  cinacalcet  (SENSIPAR ) 30 MG tablet Take 30 mg by mouth daily. 03/31/23   [provider]  FEROSUL 325 (65 Fe) MG tablet Take 325 mg by mouth daily.    [provider]  furosemide  (LASIX ) 20 MG tablet Take 1 tablet (20 mg total) by mouth daily as needed for edema. Shortness of breath and weight gain 12/01/23   Miriam Norris, NP  gabapentin  (NEURONTIN ) 100 MG capsule Take 100-200 mg by mouth at bedtime. 10/02/19   [provider]  hydrALAZINE  (APRESOLINE ) 50 MG tablet Take 1 tablet (50 mg total) by mouth 3 (three) times daily. 08/12/23   Pearlean Manus, MD  isosorbide  mononitrate (IMDUR ) 30 MG 24 hr tablet Take 1 tablet (30 mg total) by mouth daily. 08/12/23 08/11/24  Pearlean Manus, MD  ondansetron  (ZOFRAN ) 4 MG tablet Take 1 tablet (4 mg total) by mouth every 6 (six) hours. As needed for nausea vomiting 12/21/23   Triplett,  Tammy, PA-C    Allergies: Amoxicillin, Nitrofurantoin, Penicillins, and Sulfa antibiotics    Review of Systems  Constitutional:  Positive for fever.    Updated Vital Signs BP (!) 174/65   Pulse 69   Temp 99.1 F (37.3 C) (Oral)   Resp 19   Ht 5' 5 (1.651 m)   Wt 89 kg   SpO2 96%   BMI 32.65 kg/m   Physical Exam  (all labs ordered are listed, but only abnormal results are displayed) Labs Reviewed  COMPREHENSIVE METABOLIC PANEL WITH GFR - Abnormal; Notable for the following components:      Result Value   Sodium 131 (*)    Chloride 96 (*)    Glucose, Bld 162 (*)    Creatinine, Ser 1.49 (*)    GFR, Estimated 34 (*)    All other components within normal limits  CBC - Abnormal; Notable for the following components:   WBC 16.7 (*)    RDW 15.6 (*)    All other components within normal limits  URINALYSIS, ROUTINE W REFLEX MICROSCOPIC - Abnormal; Notable for the following components:   APPearance HAZY (*)    Protein, ur >=300 (*)    Leukocytes,Ua TRACE (*)    Bacteria, UA FEW (*)    All other components within normal limits  CBG MONITORING, ED - Abnormal; Notable for the following components:   Glucose-Capillary 190 (*)  All other components within normal limits  RESP PANEL BY RT-PCR (RSV, FLU A&B, COVID)  RVPGX2  CULTURE, BLOOD (ROUTINE X 2)  CULTURE, BLOOD (ROUTINE X 2)  URINE CULTURE  PROTIME-INR  LACTIC ACID, PLASMA    EKG: EKG Interpretation Date/Time:  Saturday December 24 2023 14:25:49 EST Ventricular Rate:  67 PR Interval:  196 QRS Duration:  93 QT Interval:  407 QTC Calculation: 430 R Axis:   -65  Text Interpretation: Sinus rhythm Supraventricular bigeminy Left anterior fascicular block Anterior infarct, old Nonspecific T abnormalities, lateral leads Confirmed by Cleotilde Rogue ([id number]) on 12/24/2023 2:31:45 PM  Radiology: [facility] Chest Portable 1 View Result Date: 12/24/2023 CLINICAL DATA:  Fever, nausea, vomiting, and weakness. EXAM: PORTABLE CHEST 1  VIEW COMPARISON:  10/31/2023, 09/24/2023. FINDINGS: The heart is enlarged and the mediastinal contour stable. There is atherosclerotic calcification of the aorta. A TAVR stent is noted. No consolidation, effusion, or pneumothorax is seen. A stable vague opacity is seen at the right lung base with associated fiducial marker. No acute osseous abnormality. IMPRESSION: Stable chest with no acute cardiopulmonary process Electronically Signed   By: Leita Birmingham M.D.   On: 12/24/2023 15:49     Procedures   Medications Ordered in the ED  cefTRIAXone  (ROCEPHIN ) 1 g in sodium chloride  0.9 % 100 mL IVPB (1 g Intravenous New Bag/Given 12/24/23 1735)  acetaminophen  (TYLENOL ) tablet 1,000 mg (1,000 mg Oral Given 12/24/23 1559)    Clinical Course as of 12/24/23 1759  Sat Dec 24, 2023  1727 Patient arrives from home, 102 fever on arrival, symptoms of generalized weakness, nausea, vomiting. Meets criteria for evolving sepsis - fever, tachypnea. Found to have leukocytosis 16.7. Negative lactic acid. UA concerning as source of infection. IV Rocephin  started. Cultures pending. Discussed with ED attending, Dr. Melvenia. Will admit for IV abx, review of cultures. Patient updated and agreeable to admission. Hospitalist paged.  [SU]    Clinical Course User Index [SU] Odell Balls, PA-C                                 Medical Decision Making Amount and/or Complexity of Data Reviewed Labs: ordered. Radiology: ordered.  Risk OTC drugs. Decision regarding hospitalization.        Final diagnoses:  Acute cystitis without hematuria  Sepsis without acute organ dysfunction, due to unspecified organism [facility])    ED Discharge Orders     None          Odell Balls, PA-C 12/24/23 1759    Cleotilde Rogue, MD 12/25/23 1530

## 2023-12-24 NOTE — ED Notes (Signed)
 Viral swab obtained.

## 2023-12-24 NOTE — ED Notes (Signed)
BGL 190

## 2023-12-25 DIAGNOSIS — E871 Hypo-osmolality and hyponatremia: Secondary | ICD-10-CM | POA: Diagnosis not present

## 2023-12-25 DIAGNOSIS — B952 Enterococcus as the cause of diseases classified elsewhere: Secondary | ICD-10-CM | POA: Diagnosis present

## 2023-12-25 LAB — BLOOD CULTURE ID PANEL (REFLEXED) - BCID2

## 2023-12-25 LAB — RENAL FUNCTION PANEL
Albumin: 3.4 g/dL — ABNORMAL LOW (ref 3.5–5.0)
Anion gap: 7 (ref 5–15)
BUN: 16 mg/dL (ref 8–23)
CO2: 28 mmol/L (ref 22–32)
Calcium: 8.9 mg/dL (ref 8.9–10.3)
Chloride: 98 mmol/L (ref 98–111)
Creatinine, Ser: 1.65 mg/dL — ABNORMAL HIGH (ref 0.44–1.00)
GFR, Estimated: 30 mL/min — ABNORMAL LOW (ref >60)
Glucose, Bld: 116 mg/dL — ABNORMAL HIGH (ref 70–99)
Phosphorus: 2.7 mg/dL (ref 2.5–4.6)
Potassium: 4.1 mmol/L (ref 3.5–5.1)
Sodium: 133 mmol/L — ABNORMAL LOW (ref 135–145)

## 2023-12-25 LAB — CBC
HCT: 36 % (ref 36.0–46.0)
Hemoglobin: 11.8 g/dL — ABNORMAL LOW (ref 12.0–15.0)
MCH: 27.9 pg (ref 26.0–34.0)
MCHC: 32.8 g/dL (ref 30.0–36.0)
MCV: 85.1 fL (ref 80.0–100.0)
Platelets: 153 K/uL (ref 150–400)
RBC: 4.23 MIL/uL (ref 3.87–5.11)
RDW: 15.8 % — ABNORMAL HIGH (ref 11.5–15.5)
WBC: 17.3 K/uL — ABNORMAL HIGH (ref 4.0–10.5)
nRBC: 0 % (ref 0.0–0.2)

## 2023-12-25 LAB — GLUCOSE, CAPILLARY
Glucose-Capillary: 126 mg/dL — ABNORMAL HIGH (ref 70–99)
Glucose-Capillary: 125 mg/dL — ABNORMAL HIGH (ref 70–99)
Glucose-Capillary: 116 mg/dL — ABNORMAL HIGH (ref 70–99)
Glucose-Capillary: 108 mg/dL — ABNORMAL HIGH (ref 70–99)

## 2023-12-25 MED ORDER — DAPTOMYCIN-SODIUM CHLORIDE 700-0.9 MG/100ML-% IV SOLN
8.0000 mg/kg | INTRAVENOUS | Status: DC
  Administered 2023-12-26: 700 mg via INTRAVENOUS
  Filled 2023-12-25: qty 100

## 2023-12-25 MED ORDER — SODIUM CHLORIDE 0.9 % IV SOLN
2.0000 g | INTRAVENOUS | Status: DC
  Administered 2023-12-25: 2 g via INTRAVENOUS
  Filled 2023-12-25: qty 20

## 2023-12-25 MED ORDER — SODIUM CHLORIDE 0.9 % IV SOLN: 2.0000 g | INTRAVENOUS | Status: DC

## 2023-12-25 MED ORDER — VANCOMYCIN HCL 1750 MG/350ML IV SOLN
1750.0000 mg | Freq: Once | INTRAVENOUS | Status: AC
  Administered 2023-12-25: 1750 mg via INTRAVENOUS
  Filled 2023-12-25: qty 350

## 2023-12-25 MED ORDER — VANCOMYCIN HCL IN DEXTROSE 1-5 GM/200ML-% IV SOLN: 1000.0000 mg | INTRAVENOUS | Status: DC

## 2023-12-25 NOTE — Plan of Care (Signed)
   Problem: Activity: Goal: Risk for activity intolerance will decrease Outcome: Progressing   Problem: Coping: Goal: Level of anxiety will decrease Outcome: Progressing

## 2023-12-25 NOTE — Progress Notes (Signed)
 [facility] PROGRESS NOTE  Stephanie [name] (DOB: 06/01/1935) FMW:[id number] PCP: [facility]  Brief Narrative: Stephanie [name] is an 88 y.o. female with a history of asthma, COPD, stage IIIb CKD, paroxymal atrial fibrillation, AS s/p TAVR 2022, lung CA s/p SBRT, and T2DM  who presented to the ED on 12/24/2023 with fever, weakness, malaise, fatigue for 24 hours. She was febrile on arrival to 102F  with leukocytosis (WBC 16.7k) reporting som urinary frequency and found to have pyuria suggesting UTI for which ceftriaxone  was started on admission. Blood cultures have grown GPC for which vancomycin  is added pending further speciation.   Subjective: Feels better, less malaise and fatigue since arrival last night. No dysuria or respiratory issues. No wounds or ulcers. Denies focal pain anywhere.   Objective: BP (!) 147/82 (BP Location: Left Arm)   Pulse (!) 101   Temp 98.7 F (37.1 C)   Resp 20   Ht 5' 5 (1.651 m)   Wt 89 kg   SpO2 98%   BMI 32.65 kg/m   Gen: No distress, obese and pleasant Pulm: Clear, nonlabored  CV: RRR, II/VI systolic murmur near apex. GI: Soft, NT, ND, +BS  Neuro: Alert and oriented. No new focal deficits. Ext: Warm, no deformities. Skin: No open wounds or other rashes, lesions or ulcers on visualized skin   Assessment & Plan:  Sepsis: Presumptive source is UTI, though only few bacteria on micro. blood cultures growing GPC concerning for bacteremia. Nitrite negative, ?enterococcus.  - Continue ceftriaxone  at 2g IV dosing - Start vancomycin  pending culture speciation.  - Repeat blood cultures, check echo.  - Follow up urine culture. Had mostly nonclonal urine cultures previously, had 20k CFU GBS in 2020. Has had positive MRSA PCR screens - s/p TAVR but no other implants  PAF:  - No AC due to hx GI bleed - Continue coreg  for HTN, has hx anesthesia-related bradycardia, will monitor telemetry.   COPD, asthma: Quiescent.  -  Continue prn bronchodilators  T2DM: Recent HbA1c 6.5%.  - SSI while admitted  HTN:  - Continue norvasc , coreg , bidil.   Stage IIIb CKD:  - Avoid nephrotoxins as able, deescalate vancomycin  ASAP.   Hyponatremia: Monitoring.  Stephanie Stephanie Come, MD [facility] [email or web address] 12/25/2023, 10:07 AM

## 2023-12-25 NOTE — Care Management Obs Status (Signed)
 MEDICARE OBSERVATION STATUS NOTIFICATION   Patient Details  Name: Stephanie [name] MRN: [id number] Date of Birth: Oct 01, 1935   Medicare Observation Status Notification Given:  Yes    Nancee Powell BIRCH, LCSW 12/25/2023, 12:25 PM

## 2023-12-25 NOTE — Progress Notes (Addendum)
 Pharmacy Antibiotic Note  Stephanie [name] is a 88 y.o. female admitted on 12/24/2023 with bacteremia.  Pharmacy has been consulted for vancomycin  dosing.  Enterococcus faecalis detected on BCID, new cultures have been drawn. Urine culture pending. With impaired renal function will use traditional dosing for vancomycin .  Plan: Vancomycin  1g IV every 24 hours.  Goal trough 15-20 mcg/mL. Follow up drug levels and renal function as needed.   Height: 5' 5 (165.1 cm) Weight: 89 kg (196 lb 3.4 oz) IBW/kg (Calculated) : 57  Temp (24hrs), Avg:99.5 F (37.5 C), Min:98.7 F (37.1 C), Max:102 F (38.9 C)  Recent Labs  Lab 12/21/23 1034 12/24/23 1420 12/24/23 1542 12/25/23 0512  WBC 9.3 16.7*  --  17.3*  CREATININE 1.23* 1.49*  --  1.65*  LATICACIDVEN  --   --  1.2  --     Estimated Creatinine Clearance: 27 mL/min (A) (by C-G formula based on SCr of 1.65 mg/dL (H)).    Allergies  Allergen Reactions   Amoxicillin Swelling    Tongue swelling    Nitrofurantoin Nausea And Vomiting   Penicillins Swelling   Sulfa Antibiotics Swelling    Lip swelling   Addendum:   Infectious disease has changed therapy to daptomycin .  Vancomycin  given this morning, will start daptomycin  700mg  every 48 hours tomorrow.   Thank you for allowing pharmacy to be a part of this patient's care.  Dempsey Blush PharmD., BCPS Clinical Pharmacist 12/25/2023 7:54 AM

## 2023-12-25 NOTE — Progress Notes (Addendum)
   12/25/23 1209  TOC Brief Assessment  Patient has primary care physician Yes  Home environment has been reviewed with children  Prior level of function: walker  Prior/Current Home Services No current home services  Social Drivers of Health Review SDOH reviewed no interventions necessary  Readmission risk has been reviewed Yes  Transition of care needs no transition of care needs at this time   Lives w/adult grandchildren.

## 2023-12-25 NOTE — Progress Notes (Signed)
       Patient with Enterococcal bacteremia in context of TAVR  She is allegedly allergic to amoxicillin  I have changed her to daptomycin   TTE is scheduled and ideally a TEE should be done  We will repeat blood cultures tomorrow  I would recommend amoxicillin challenge during the day tomorrow  Formal consult to follow.   Jomarie [place] 12/25/2023, 6:04 PM

## 2023-12-25 NOTE — Plan of Care (Signed)
  Problem: Education: Goal: Knowledge of General Education information will improve Description: Including pain rating scale, medication(s)/side effects and non-pharmacologic comfort measures Outcome: Progressing   Problem: Health Behavior/Discharge Planning: Goal: Ability to manage health-related needs will improve Outcome: Progressing   Problem: Clinical Measurements: Goal: Ability to maintain clinical measurements within normal limits will improve Outcome: Progressing Goal: Respiratory complications will improve Outcome: Progressing Goal: Cardiovascular complication will be avoided Outcome: Progressing   Problem: Activity: Goal: Risk for activity intolerance will decrease Outcome: Progressing   Problem: Pain Managment: Goal: General experience of comfort will improve and/or be controlled Outcome: Progressing   Problem: Safety: Goal: Ability to remain free from injury will improve Outcome: Progressing   Problem: Education: Goal: Ability to describe self-care measures that may prevent or decrease complications (Diabetes Survival Skills Education) will improve Outcome: Progressing

## 2023-12-26 ENCOUNTER — Inpatient Hospital Stay (HOSPITAL_COMMUNITY)

## 2023-12-26 DIAGNOSIS — B952 Enterococcus as the cause of diseases classified elsewhere: Secondary | ICD-10-CM

## 2023-12-26 DIAGNOSIS — Z87891 Personal history of nicotine dependence: Secondary | ICD-10-CM

## 2023-12-26 DIAGNOSIS — R7881 Bacteremia: Secondary | ICD-10-CM

## 2023-12-26 DIAGNOSIS — B9620 Unspecified Escherichia coli [E. coli] as the cause of diseases classified elsewhere: Secondary | ICD-10-CM

## 2023-12-26 DIAGNOSIS — Z952 Presence of prosthetic heart valve: Secondary | ICD-10-CM

## 2023-12-26 DIAGNOSIS — Z794 Long term (current) use of insulin: Secondary | ICD-10-CM

## 2023-12-26 DIAGNOSIS — E1122 Type 2 diabetes mellitus with diabetic chronic kidney disease: Secondary | ICD-10-CM

## 2023-12-26 DIAGNOSIS — R8271 Bacteriuria: Secondary | ICD-10-CM

## 2023-12-26 DIAGNOSIS — J449 Chronic obstructive pulmonary disease, unspecified: Secondary | ICD-10-CM

## 2023-12-26 DIAGNOSIS — N183 Chronic kidney disease, stage 3 unspecified: Secondary | ICD-10-CM

## 2023-12-26 DIAGNOSIS — Z880 Allergy status to penicillin: Secondary | ICD-10-CM

## 2023-12-26 LAB — CBC
HCT: 35 % — ABNORMAL LOW (ref 36.0–46.0)
Hemoglobin: 11.5 g/dL — ABNORMAL LOW (ref 12.0–15.0)
MCH: 28.2 pg (ref 26.0–34.0)
MCHC: 32.9 g/dL (ref 30.0–36.0)
MCV: 85.8 fL (ref 80.0–100.0)
Platelets: 125 K/uL — ABNORMAL LOW (ref 150–400)
RBC: 4.08 MIL/uL (ref 3.87–5.11)
RDW: 15.9 % — ABNORMAL HIGH (ref 11.5–15.5)
WBC: 13.1 K/uL — ABNORMAL HIGH (ref 4.0–10.5)
nRBC: 0 % (ref 0.0–0.2)

## 2023-12-26 LAB — BASIC METABOLIC PANEL WITH GFR
Anion gap: 14 (ref 5–15)
BUN: 21 mg/dL (ref 8–23)
CO2: 21 mmol/L — ABNORMAL LOW (ref 22–32)
Calcium: 8.9 mg/dL (ref 8.9–10.3)
Chloride: 98 mmol/L (ref 98–111)
Creatinine, Ser: 1.69 mg/dL — ABNORMAL HIGH (ref 0.44–1.00)
GFR, Estimated: 29 mL/min — ABNORMAL LOW (ref >60)
Glucose, Bld: 95 mg/dL (ref 70–99)
Potassium: 3.8 mmol/L (ref 3.5–5.1)
Sodium: 133 mmol/L — ABNORMAL LOW (ref 135–145)

## 2023-12-26 LAB — ECHOCARDIOGRAM COMPLETE
AV Mean grad: 9 mmHg
AV Peak grad: 17.1 mmHg
Ao pk vel: 2.07 m/s
Area-P 1/2: 4.68 cm2
Height: 65 in
S' Lateral: 2 cm
Weight: 3139.35 [oz_av]

## 2023-12-26 LAB — GLUCOSE, CAPILLARY
Glucose-Capillary: 110 mg/dL — ABNORMAL HIGH (ref 70–99)
Glucose-Capillary: 100 mg/dL — ABNORMAL HIGH (ref 70–99)
Glucose-Capillary: 164 mg/dL — ABNORMAL HIGH (ref 70–99)
Glucose-Capillary: 129 mg/dL — ABNORMAL HIGH (ref 70–99)

## 2023-12-26 LAB — URINE CULTURE: Culture: >=100000 — AB

## 2023-12-26 LAB — CK: Total CK: 45 U/L (ref 38–234)

## 2023-12-26 MED ORDER — SODIUM CHLORIDE 0.9 % IV SOLN
2.0000 g | Freq: Three times a day (TID) | INTRAVENOUS | Status: DC
  Administered 2023-12-26 – 2023-12-28 (×7): 2 g via INTRAVENOUS
  Filled 2023-12-26 (×10): qty 2000

## 2023-12-26 MED ORDER — SODIUM CHLORIDE 0.9 % IV SOLN
2.0000 g | INTRAVENOUS | Status: DC
  Administered 2023-12-26: 2 g via INTRAVENOUS
  Filled 2023-12-26: qty 20

## 2023-12-26 MED ORDER — PROCHLORPERAZINE EDISYLATE 10 MG/2ML IJ SOLN: 10.0000 mg | Freq: Four times a day (QID) | INTRAMUSCULAR | Status: DC | PRN

## 2023-12-26 MED ORDER — SODIUM CHLORIDE 0.9 % IV SOLN
2.0000 g | Freq: Two times a day (BID) | INTRAVENOUS | Status: DC
  Administered 2023-12-26 – 2023-12-28 (×4): 2 g via INTRAVENOUS
  Filled 2023-12-26 (×4): qty 20

## 2023-12-26 NOTE — TOC Initial Note (Signed)
 Transition of Care [facility]) - Initial/Assessment Note    Patient Details  Name: Stephanie [name] MRN: [id number] Date of Birth: 26-Oct-1935  Transition of Care [facility]) CM/SW Contact:    Mcarthur Saddie Kim, LCSW Phone Number: 12/26/2023, 8:29 AM  Clinical Narrative: Pt admitted for sepsis secondary to UTI. Assessment completed due to high risk readmission score. Pt reports she lives with her adult grandchildren. She is fairly independent with ADLs and ambulates with a walker at baseline. Grandchildren provide transport to appointments. No current home health services. Plan is for return home when medically stable. TOC will follow.                   Expected Discharge Plan: Home/Self Care Barriers to Discharge: Continued Medical Work up   Patient Goals and CMS Choice Patient states their goals for this hospitalization and ongoing recovery are:: return home   Choice offered to / list presented to : Patient [place] ownership interest in [facility].provided to::  (n/a)    Expected Discharge Plan and Services In-house Referral: Clinical Social Work     Living arrangements for the past 2 months: Mobile Home                                      Prior Living Arrangements/Services Living arrangements for the past 2 months: Mobile Home Lives with:: Relatives Patient language and need for interpreter reviewed:: Yes        Need for Family Participation in Patient Care: No (Comment)   Current home services: DME (walker) Criminal Activity/Legal Involvement Pertinent to Current Situation/Hospitalization: No - Comment as needed  Activities of Daily Living   ADL Screening (condition at time of admission) Independently performs ADLs?: Yes (appropriate for developmental age) Is the patient deaf or have difficulty hearing?: Yes Does the patient have difficulty seeing, even when wearing glasses/contacts?: No Does the patient have difficulty concentrating, remembering,  or making decisions?: No  Permission Sought/Granted                  Emotional Assessment     Affect (typically observed): Appropriate Orientation: : Oriented to Self, Oriented to Place, Oriented to  Time, Oriented to Situation Alcohol / Substance Use: Not Applicable Psych Involvement: No (comment)  Admission diagnosis:  Hyponatremia [E87.1] Acute cystitis without hematuria [N30.00] Sepsis without acute organ dysfunction, due to unspecified organism [facility]) [A41.9] Patient Active Problem List   Diagnosis Date Noted   Enterococcal bacteremia 12/25/2023   Hyponatremia 12/24/2023   Chest pain 10/31/2023   Obesity, Class I, BMI 30-34.9 10/31/2023   Chronic heart failure with preserved ejection fraction (HFpEF) (HCC) 10/31/2023   Paroxysmal atrial fibrillation (HCC) 10/31/2023   Acute respiratory failure with hypoxia (HCC) 09/24/2023   Acute heart failure with preserved ejection fraction (HFpEF) (HCC) 09/24/2023   Chronic respiratory failure with hypoxia (HCC) 09/10/2023   Heart failure (HCC) 09/03/2023   COPD with acute exacerbation (HCC) 09/03/2023   Rt LL Lung mass 08/12/2023   Acute kidney injury superimposed on stage 4 chronic kidney disease (HCC) 04/07/2023   CKD stage 3b, GFR 30-44 ml/min (HCC) 04/07/2023   Atherosclerosis of artery of extremity with intermittent claudication 09/15/2022   COPD (chronic obstructive pulmonary disease) (HCC) 03/21/2022   Lung nodule 02/09/2022   Diverticulitis 07/02/2021   Solitary pulmonary nodule on lung CT 03/31/2021   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  DOE (dyspnea on exertion) 04/08/2020   Asthma 04/08/2020   DM (diabetes mellitus) (HCC) 04/08/2020   Severe aortic stenosis 04/08/2020   Symptomatic anemia 02/09/2019   Vitamin B12 deficiency    UTI (urinary tract infection) 11/15/2017   Type 2 diabetes mellitus with chronic kidney disease, with long-term current use of insulin  (HCC) 07/06/2017   Essential  hypertension, benign 07/06/2017   Mixed hyperlipidemia 07/06/2017   Obesity, class 2 07/06/2017   Medial meniscus, posterior horn derangement 04/12/2012   OA (osteoarthritis) of knee 04/12/2012   PCP:  [facility] Pharmacy:   [facility] - [place], [place] - [address] [place] [address] Phone: [phone] Fax: [phone]     Social Drivers of Health (SDOH) Social History: SDOH Screenings   Food Insecurity: No Food Insecurity (12/24/2023)  Housing: Low Risk  (12/24/2023)  Transportation Needs: No Transportation Needs (12/24/2023)  Utilities: Not At Risk (12/24/2023)  Depression (PHQ2-9): Low Risk  (03/12/2022)  Social Connections: Moderately Isolated (12/24/2023)  Tobacco Use: Medium Risk (12/24/2023)   SDOH Interventions:     Readmission Risk Interventions    12/26/2023    8:28 AM 09/26/2023   11:50 AM 09/04/2023    7:28 PM  Readmission Risk Prevention Plan  Transportation Screening Complete Complete Complete  PCP or Specialist Appt within 3-5 Days   Complete  HRI or Home Care Consult   Complete  Social Work Consult for Recovery Care Planning/Counseling   Complete  Palliative Care Screening   Not Applicable  Medication Review [employer]) Complete Complete Complete  PCP or Specialist appointment within 3-5 days of discharge  Complete   HRI or Home Care Consult Complete Complete   SW Recovery Care/Counseling Consult Complete Complete   Palliative Care Screening Not Applicable Not Applicable   Skilled Nursing Facility Not Applicable Complete

## 2023-12-26 NOTE — Plan of Care (Signed)

## 2023-12-26 NOTE — Consult Note (Signed)
 Virtual Visit via Video Note  I connected with Stephanie [name] on 12/26/2023 at 1255PM  by a video enabled telemedicine application and verified that I am speaking with the correct person using two identifiers.  Location: Patient: [id number] Provider: [facility]   I discussed the limitations of evaluation and management by telemedicine and the availability of in person appointments. The patient expressed understanding and agreed to proceed.   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 Coli Childhood penicillin allergy that is no longer in play given the fact that she has tolerated amoxicillin History of former smoking and COPD Diabetes mellitus Chronic kidney disease  Plan:  Change her over to high-dose ampicillin  along with ceftriaxone  2 g IV every 12 I am repeating blood cultures Once blood cultures have been negative x 3 days would plan on placing a PICC line so that she can complete her total of 6 weeks of antibiotics from date of clearance of blood cultures we will plan on seeing her in clinic for follow-up while she is on antibiotics and then 2 to 3 weeks after finishing antibiotics to check surveillance blood cultures Standard universal precautions  Principal Problem:   Enterococcal bacteremia Active Problems:   Type 2 diabetes mellitus with chronic kidney disease, with long-term current use of insulin  (HCC)   UTI (urinary tract infection)   DM (diabetes mellitus) (HCC)   S/P TAVR (transcatheter aortic valve replacement)   COPD (chronic obstructive pulmonary disease) (HCC)   Chronic heart failure with preserved ejection fraction (HFpEF) (HCC)   Paroxysmal atrial fibrillation (HCC)    Hyponatremia   Scheduled Meds:  amLODipine   2.5 mg Oral Daily   carvedilol   6.25 mg Oral BID WC   cinacalcet   30 mg Oral Q breakfast   ferrous sulfate   325 mg Oral Daily   gabapentin   100-200 mg Oral QHS   heparin   5,000 Units Subcutaneous Q8H   hydrALAZINE   50 mg Oral TID   insulin  aspart  0-5 Units Subcutaneous QHS   insulin  aspart  0-6 Units Subcutaneous TID WC   isosorbide  mononitrate  30 mg Oral Daily   sodium chloride  flush  3 mL Intravenous Q12H   sodium chloride  flush  3 mL Intravenous Q12H   Continuous Infusions:  cefTRIAXone  (ROCEPHIN )  IV Stopped (12/26/23 1100)   DAPTOmycin  700 mg (12/26/23 1101)   PRN Meds:.acetaminophen  **OR** acetaminophen , albuterol , bisacodyl , labetalol , ondansetron  **OR** ondansetron  (ZOFRAN ) IV, polyethylene glycol, prochlorperazine , sodium chloride  flush, traZODone   HPI: Stephanie [name] is a 88 y.o. female who has a past medical history significant for prior smoking, asthma COPD chronic kidney disease, diabetes mellitus paroxysmal atrial fibrillation aortic valve disease status post TAVR who presented to the emergency department on 6 December with fevers up to 102 chills weakness malaise fatigue nausea and vomiting.  Of note she had presented to the ED the 3 days prior to that with a fall and symptoms that included abdominal pain at that time.  During her stay in the ER on the third she had a CT of the abdomen abdomen pelvis performed which was unrevealing.  She also had a CT of the cervical and lumbar spine that had no acute  findings.  She was ultimately sent back home but again as mentioned returned with ongoing nausea vomiting and now fevers and chills and symptoms of weakness.  Her urinalysis showed some pyuria though she did not have any urinary tract specific symptoms.  Blood cultures taken as were urine color sugars  Blood cultures have come back positive with Enterococcus faecalis in both sites that were sampled urine has come back  with E. coli.  She has had a 2D echocardiogram that does not show evidence of endocarditis.  I discussed the idea of doing a transesophageal echocardiogram with the patient and also with her daughter.  Per the daughter the patient did have an elective biopsy terminated in [place] due to concerns about her breathing by anesthesiology.  Because of this I think we should forego doing a transesophageal echocardiogram and treat her presumptively for prosthetic valve endocarditis.  I think this is also reasonable given the fact that we have no source of infection in the abdomen or urine or other specific site to blame for the infection and the consequences of not properly treating a prosthetic valve endocarditis could be dire.  And talk to the patient she states that she has had no trouble whatsoever with amoxicillin and does not recall her allergy to penicillin.  This was a childhood 1.  Because she thankfully tolerates amoxicillin we will now be able to pivot from daptomycin  to ampicillin  high dose along with ceftriaxone  which is the optimal treatment for enterococcal endocarditis.  I will also remove her penicillin allergy because if she can tolerate amoxicillin she is no longer allergic to penicillin  We will make sure she has had repeat blood cultures and we will plan on placement of a PICC line once we have no growth on repeat blood cultures for 3 days.  I personally spent a total of 85 minutes in the care of the patient today including preparing to see the patient, getting/reviewing separately obtained history, performing a medically appropriate exam/evaluation, counseling and educating, placing orders, referring and communicating with other health care professionals, documenting clinical information in the EHR, independently interpreting results, communicating results, coordinating care, and taking to the daughter.    Evaluation of the patient requires complex antimicrobial therapy  evaluation, counseling , isolation needs to reduce disease transmission and risk assessment and mitigation.     Review of Systems: Review of Systems  Constitutional:  Positive for chills, fever and malaise/fatigue. Negative for weight loss.  HENT:  Negative for congestion and sore throat.   Eyes:  Negative for blurred vision and photophobia.  Respiratory:  Negative for cough, shortness of breath and wheezing.   Cardiovascular:  Negative for chest pain, palpitations and leg swelling.  Gastrointestinal:  Negative for abdominal pain, blood in stool, constipation, diarrhea, heartburn, melena, nausea and vomiting.  Genitourinary:  Negative for dysuria, flank pain and hematuria.  Musculoskeletal:  Negative for back pain, falls, joint pain and myalgias.  Skin:  Negative for itching and rash.  Neurological:  Positive for weakness. Negative for dizziness, focal weakness, loss of consciousness and headaches.  Endo/Heme/Allergies:  Does not bruise/bleed easily.  Psychiatric/Behavioral:  Negative for depression and suicidal ideas. The patient does not have insomnia.     Past Medical History:  Diagnosis Date   Arthritis    Asthma    CKD (chronic kidney disease)    COPD (chronic obstructive pulmonary disease) (HCC)    Essential hypertension    Heart murmur    Mixed hyperlipidemia    PAF (  paroxysmal atrial fibrillation) (HCC)    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 Wonda and Dr. Lucas.   Severe aortic stenosis    Symptomatic anemia    Presumed GI bleed January 2021 - she declined GI work-up   Type 2 diabetes mellitus (HCC)     Social History   Tobacco Use   Smoking status: Former    Current packs/day: 1.00    Types: Cigarettes    Passive exposure: Never   Smokeless tobacco: Never   Tobacco comments:    Quit over 40 years ago.  Does not know how many years she smoked or start/stop date  Vaping Use    Vaping status: Never Used  Substance Use Topics   Alcohol use: No   Drug use: No    Family History  Problem Relation Age of Onset   Lung disease Other    Cancer Other    Arthritis Other    Asthma Other    Diabetes Other    Diabetes Mother    Hypertension Mother    Diverticulitis Mother    Diabetes Father    Hypertension Sister    Diabetes Brother    Colon cancer Neg Hx    Allergies  Allergen Reactions   Amoxicillin Swelling    Tongue swelling    Nitrofurantoin Nausea And Vomiting   Penicillins Swelling   Sulfa Antibiotics Swelling    Lip swelling    OBJECTIVE: Blood pressure (!) 145/62, pulse 66, temperature (!) 100.4 F (38 C), temperature source Oral, resp. rate 18, height 5' 5 (1.651 m), weight 89 kg, SpO2 97%.  Physical Exam Constitutional:      General: She is not in acute distress.    Appearance: Normal appearance. She is well-developed. She is not ill-appearing or diaphoretic.  HENT:     Head: Normocephalic and atraumatic.     Right Ear: Hearing and external ear normal.     Left Ear: Hearing and external ear normal.     Nose: No nasal deformity or rhinorrhea.  Eyes:     General: No scleral icterus.    Conjunctiva/sclera: Conjunctivae normal.     Right eye: Right conjunctiva is not injected.     Left eye: Left conjunctiva is not injected.  Neck:     Vascular: No JVD.  Cardiovascular:     Rate and Rhythm: Normal rate and regular rhythm.     Heart sounds: S1 normal and S2 normal.  Abdominal:     General: There is no distension.     Palpations: Abdomen is soft.  Musculoskeletal:     Right shoulder: Normal.     Left shoulder: Normal.     Cervical back: Normal range of motion and neck supple.     Right hip: Normal.     Left hip: Normal.     Right knee: Normal.     Left knee: Normal.  Lymphadenopathy:     Head:     Right side of head: No submandibular, preauricular or posterior auricular adenopathy.     Left side of head: No submandibular,  preauricular or posterior auricular adenopathy.     Cervical: No cervical adenopathy.     Right cervical: No superficial or deep cervical adenopathy.    Left cervical: No superficial or deep cervical adenopathy.  Skin:    General: Skin is warm and dry.     Coloration: Skin is not  pale.     Findings: No abrasion, bruising, ecchymosis, erythema, lesion or rash.     Nails: There is no clubbing.  Neurological:     Mental Status: She is alert and oriented to person, place, and time.     Sensory: No sensory deficit.     Coordination: Coordination normal.     Gait: Gait normal.  Psychiatric:        Attention and Perception: She is attentive.        Mood and Affect: Mood normal.        Speech: Speech normal.        Behavior: Behavior normal. Behavior is cooperative.        Thought Content: Thought content normal.        Judgment: Judgment normal.     Lab Results Lab Results  Component Value Date   WBC 13.1 (H) 12/26/2023   HGB 11.5 (L) 12/26/2023   HCT 35.0 (L) 12/26/2023   MCV 85.8 12/26/2023   PLT 125 (L) 12/26/2023    Lab Results  Component Value Date   CREATININE 1.69 (H) 12/26/2023   BUN 21 12/26/2023   NA 133 (L) 12/26/2023   K 3.8 12/26/2023   CL 98 12/26/2023   CO2 21 (L) 12/26/2023    Lab Results  Component Value Date   ALT 7 12/24/2023   AST 21 12/24/2023   ALKPHOS 116 12/24/2023   BILITOT 1.2 12/24/2023     Microbiology: Recent Results (from the past 240 hours)  Resp panel by RT-PCR (RSV, Flu A&B, Covid) Anterior Nasal Swab     Status: None   Collection Time: 12/24/23  2:27 PM   Specimen: Anterior Nasal Swab  Result Value Ref Range Status   SARS Coronavirus 2 by RT PCR NEGATIVE NEGATIVE Final    Comment: (NOTE) SARS-CoV-2 target nucleic acids are NOT DETECTED.  The SARS-CoV-2 RNA is generally detectable in upper respiratory specimens during the acute phase of infection. The lowest concentration of SARS-CoV-2 viral copies this assay can detect is 138  copies/mL. A negative result does not preclude SARS-Cov-2 infection and should not be used as the sole basis for treatment or other patient management decisions. A negative result may occur with  improper specimen collection/handling, submission of specimen other than nasopharyngeal swab, presence of viral mutation(s) within the areas targeted by this assay, and inadequate number of viral copies(<138 copies/mL). A negative result must be combined with clinical observations, patient history, and epidemiological information. The expected result is Negative.  Fact Sheet for Patients:  bloggercourse.com  Fact Sheet for Healthcare Providers:  seriousbroker.it  This test is no t yet approved or cleared by the [place]  FDA and  has been authorized for detection and/or diagnosis of SARS-CoV-2 by FDA under an Emergency Use Authorization (EUA). This EUA will remain  in effect (meaning this test can be used) for the duration of the COVID-19 declaration under Section 564(b)(1) of the Act, 21 U.S.C.section 360bbb-3(b)(1), unless the authorization is terminated  or revoked sooner.       Influenza A by PCR NEGATIVE NEGATIVE Final   Influenza B by PCR NEGATIVE NEGATIVE Final    Comment: (NOTE) The Xpert Xpress SARS-CoV-2/FLU/RSV plus assay is intended as an aid in the diagnosis of influenza from Nasopharyngeal swab specimens and should not be used as a sole basis for treatment. Nasal washings and aspirates are unacceptable for Xpert Xpress SARS-CoV-2/FLU/RSV testing.  Fact Sheet for Patients: bloggercourse.com  Fact Sheet for Healthcare Providers: seriousbroker.it  This test is not yet approved or cleared by the [place]  FDA and has been authorized for detection and/or diagnosis of SARS-CoV-2 by FDA under an Emergency Use Authorization (EUA). This EUA will remain in effect (meaning  this test can be used) for the duration of the COVID-19 declaration under Section 564(b)(1) of the Act, 21 U.S.C. section 360bbb-3(b)(1), unless the authorization is terminated or revoked.     Resp Syncytial Virus by PCR NEGATIVE NEGATIVE Final    Comment: (NOTE) Fact Sheet for Patients: bloggercourse.com  Fact Sheet for Healthcare Providers: seriousbroker.it  This test is not yet approved or cleared by the [place]  FDA and has been authorized for detection and/or diagnosis of SARS-CoV-2 by FDA under an Emergency Use Authorization (EUA). This EUA will remain in effect (meaning this test can be used) for the duration of the COVID-19 declaration under Section 564(b)(1) of the Act, 21 U.S.C. section 360bbb-3(b)(1), unless the authorization is terminated or revoked.  Performed at [facility], [address], [place], [place] [address]   Culture, blood (routine x 2)     Status: Abnormal (Preliminary result)   Collection Time: 12/24/23  3:42 PM   Specimen: BLOOD LEFT ARM  Result Value Ref Range Status   Specimen Description   Final    BLOOD LEFT ARM BOTTLES DRAWN AEROBIC AND ANAEROBIC Performed at [facility], [address], [place], [place] [address]    Special Requests   Final    Blood Culture results may not be optimal due to an inadequate volume of blood received in culture bottles Performed at [facility], [address], [place], [place] [address]    Culture  Setup Time   Final    GRAM POSITIVE COCCI IN BOTH AEROBIC AND ANAEROBIC BOTTLES CRITICAL RESULT CALLED TO, READ BACK BY AND VERIFIED WITH: ANABEL,RN ON 12/25/23 AT 0430 BY PURDIE,J CRITICAL VALUE NOTED.  VALUE IS CONSISTENT WITH PREVIOUSLY REPORTED AND CALLED VALUE. Performed at [facility], [address] [place], [place] [address]    Culture ENTEROCOCCUS FAECALIS (A)  Final   Report Status PENDING  Incomplete  Culture, blood (routine x 2)     Status: None  (Preliminary result)   Collection Time: 12/24/23  4:09 PM   Specimen: Left Antecubital; Blood  Result Value Ref Range Status   Specimen Description   Final    LEFT ANTECUBITAL BOTTLES DRAWN AEROBIC AND ANAEROBIC Performed at [facility], [address] [place], [place] [address]    Special Requests   Final    Blood Culture results may not be optimal due to an inadequate volume of blood received in culture bottles Performed at [facility], [address], [place], [place] [address]    Culture  Setup Time   Final    GRAM POSITIVE COCCI IN BOTH AEROBIC AND ANAEROBIC BOTTLES CRITICAL RESULT CALLED TO, READ BACK BY AND VERIFIED WITH: ANABEL,RN ON 12/25/23 AT 0430 BY PURDIE,J CRITICAL RESULT CALLED TO, READ BACK BY AND VERIFIED WITH: RN ARMOND [id number] AT 1020 BY EC Performed at [facility], [address] [place], [place] [address]    Culture [facility] POSITIVE COCCI  Final   Report Status PENDING  Incomplete  Blood Culture ID Panel (Reflexed)     Status: Abnormal   Collection Time: 12/24/23  4:09 PM  Result Value Ref Range Status   Enterococcus faecalis DETECTED (A) NOT DETECTED Final    Comment: CRITICAL RESULT CALLED TO, READ BACK BY AND VERIFIED WITH: RN ARMOND [id number]  AT 1020 BY EC    Enterococcus Faecium NOT DETECTED NOT DETECTED Final   Listeria monocytogenes NOT DETECTED NOT DETECTED Final   Staphylococcus species NOT DETECTED NOT DETECTED Final   Staphylococcus aureus (BCID) NOT DETECTED NOT DETECTED Final   Staphylococcus epidermidis NOT DETECTED NOT DETECTED Final   Staphylococcus lugdunensis NOT DETECTED NOT DETECTED Final   Streptococcus species NOT DETECTED NOT DETECTED Final   Streptococcus agalactiae NOT DETECTED NOT DETECTED Final   Streptococcus pneumoniae NOT DETECTED NOT DETECTED Final   Streptococcus pyogenes NOT DETECTED NOT DETECTED Final   A.calcoaceticus-baumannii NOT DETECTED NOT DETECTED Final   Bacteroides fragilis NOT DETECTED NOT DETECTED Final    Enterobacterales NOT DETECTED NOT DETECTED Final   Enterobacter cloacae complex NOT DETECTED NOT DETECTED Final   Escherichia coli NOT DETECTED NOT DETECTED Final   Klebsiella aerogenes NOT DETECTED NOT DETECTED Final   Klebsiella oxytoca NOT DETECTED NOT DETECTED Final   Klebsiella pneumoniae NOT DETECTED NOT DETECTED Final   Proteus species NOT DETECTED NOT DETECTED Final   Salmonella species NOT DETECTED NOT DETECTED Final   Serratia marcescens NOT DETECTED NOT DETECTED Final   Haemophilus influenzae NOT DETECTED NOT DETECTED Final   Neisseria meningitidis NOT DETECTED NOT DETECTED Final   Pseudomonas aeruginosa NOT DETECTED NOT DETECTED Final   Stenotrophomonas maltophilia NOT DETECTED NOT DETECTED Final   Candida albicans NOT DETECTED NOT DETECTED Final   Candida auris NOT DETECTED NOT DETECTED Final   Candida glabrata NOT DETECTED NOT DETECTED Final   Candida krusei NOT DETECTED NOT DETECTED Final   Candida parapsilosis NOT DETECTED NOT DETECTED Final   Candida tropicalis NOT DETECTED NOT DETECTED Final   Cryptococcus neoformans/gattii NOT DETECTED NOT DETECTED Final   Vancomycin  resistance NOT DETECTED NOT DETECTED Final    Comment: Performed at [facility], [address] [place], [place] [address]  Urine Culture     Status: Abnormal   Collection Time: 12/24/23  4:25 PM   Specimen: Urine, Clean Catch  Result Value Ref Range Status   Specimen Description   Final    URINE, CLEAN CATCH Performed at [facility], [address], [place], [place] [address]    Special Requests   Final    NONE Performed at [facility], [address], [place], [place] [address]    Culture >=100,000 COLONIES/mL ESCHERICHIA COLI (A)  Final   Report Status 12/26/2023 FINAL  Final   Organism ID, Bacteria ESCHERICHIA COLI (A)  Final      Susceptibility   Escherichia coli - MIC*    AMPICILLIN  >=32 RESISTANT Resistant     CEFAZOLIN  (URINE) Value in next row Sensitive      2 SENSITIVEThis  is a modified FDA-approved test that has been validated and its performance characteristics determined by the reporting laboratory.  This laboratory is certified under the Clinical Laboratory Improvement Amendments CLIA as qualified to perform high complexity clinical laboratory testing.    CEFEPIME Value in next row Sensitive      2 SENSITIVEThis is a modified FDA-approved test that has been validated and its performance characteristics determined by the reporting laboratory.  This laboratory is certified under the Clinical Laboratory Improvement Amendments CLIA as qualified to perform high complexity clinical laboratory testing.    ERTAPENEM Value in next row Sensitive      2 SENSITIVEThis is a modified FDA-approved test that has been validated and its performance characteristics determined by the reporting laboratory.  This laboratory is certified under the Clinical Laboratory  Improvement Amendments CLIA as qualified to perform high complexity clinical laboratory testing.    CEFTRIAXONE  Value in next row Sensitive      2 SENSITIVEThis is a modified FDA-approved test that has been validated and its performance characteristics determined by the reporting laboratory.  This laboratory is certified under the Clinical Laboratory Improvement Amendments CLIA as qualified to perform high complexity clinical laboratory testing.    CIPROFLOXACIN  Value in next row Intermediate      2 SENSITIVEThis is a modified FDA-approved test that has been validated and its performance characteristics determined by the reporting laboratory.  This laboratory is certified under the Clinical Laboratory Improvement Amendments CLIA as qualified to perform high complexity clinical laboratory testing.    GENTAMICIN Value in next row Sensitive      2 SENSITIVEThis is a modified FDA-approved test that has been validated and its performance characteristics determined by the reporting laboratory.  This laboratory is certified under the  Clinical Laboratory Improvement Amendments CLIA as qualified to perform high complexity clinical laboratory testing.    NITROFURANTOIN Value in next row Sensitive      2 SENSITIVEThis is a modified FDA-approved test that has been validated and its performance characteristics determined by the reporting laboratory.  This laboratory is certified under the Clinical Laboratory Improvement Amendments CLIA as qualified to perform high complexity clinical laboratory testing.    TRIMETH/SULFA Value in next row Resistant      2 SENSITIVEThis is a modified FDA-approved test that has been validated and its performance characteristics determined by the reporting laboratory.  This laboratory is certified under the Clinical Laboratory Improvement Amendments CLIA as qualified to perform high complexity clinical laboratory testing.    AMPICILLIN /SULBACTAM Value in next row Sensitive      2 SENSITIVEThis is a modified FDA-approved test that has been validated and its performance characteristics determined by the reporting laboratory.  This laboratory is certified under the Clinical Laboratory Improvement Amendments CLIA as qualified to perform high complexity clinical laboratory testing.    PIP/TAZO Value in next row Sensitive      <=4 SENSITIVEThis is a modified FDA-approved test that has been validated and its performance characteristics determined by the reporting laboratory.  This laboratory is certified under the Clinical Laboratory Improvement Amendments CLIA as qualified to perform high complexity clinical laboratory testing.    MEROPENEM  Value in next row Sensitive      <=4 SENSITIVEThis is a modified FDA-approved test that has been validated and its performance characteristics determined by the reporting laboratory.  This laboratory is certified under the Clinical Laboratory Improvement Amendments CLIA as qualified to perform high complexity clinical laboratory testing.    * >=100,000 COLONIES/mL ESCHERICHIA COLI   Culture, blood (Routine X 2) w Reflex to ID Panel     Status: None (Preliminary result)   Collection Time: 12/25/23 10:51 AM   Specimen: BLOOD  Result Value Ref Range Status   Specimen Description BLOOD LEFT ANTECUBITAL  Final   Special Requests   Final    BOTTLES DRAWN AEROBIC AND ANAEROBIC Blood Culture adequate volume   Culture   Final    NO GROWTH < 24 HOURS Performed at [facility], [address] [place], [place] [address]    Report Status PENDING  Incomplete  Culture, blood (Routine X 2) w Reflex to ID Panel     Status: None (Preliminary result)   Collection Time: 12/25/23 10:51 AM   Specimen: BLOOD  Result Value Ref Range Status   Specimen Description BLOOD  BLOOD LEFT HAND  Final   Special Requests   Final    BOTTLES DRAWN AEROBIC AND ANAEROBIC Blood Culture adequate volume   Culture   Final    NO GROWTH < 24 HOURS Performed at [facility] [facility], [address], [place], [place] [address]    Report Status PENDING  Incomplete    Jomarie Fleeta Rothman, MD [facility] [phone] pager  12/26/2023, 12:41 PM

## 2023-12-26 NOTE — Plan of Care (Signed)
   Problem: Activity: Goal: Risk for activity intolerance will decrease Outcome: Progressing   Problem: Coping: Goal: Level of anxiety will decrease Outcome: Progressing

## 2023-12-26 NOTE — Progress Notes (Signed)
 [facility] PROGRESS NOTE  BAYLEIGH LOFLIN (DOB: [birth year]) FMW:[id number] PCP: [facility]  Brief Narrative: Stephanie [name] is an 88 y.o. female with a history of asthma, COPD, stage IIIb CKD, paroxymal atrial fibrillation, AS s/p TAVR 2022, lung CA s/p SBRT, and T2DM  who presented to the ED on 12/24/2023 with fever, weakness, malaise, fatigue for 24 hours. She was febrile on arrival to 102F  with leukocytosis (WBC 16.7k) reporting som urinary frequency and found to have pyuria suggesting UTI for which ceftriaxone  was started on admission. Blood cultures have grown E. faecalis and urine cultures have grown E. coli. Abdominopelvic CT showed no occult source of infection. ID is consulted for further recommendations for IV antimicrobial therapy. TTE showed normal functioning of TAVR without vegetation, and TEE is considered to present prohibitive risk. So we will treat with IV antibiotics for 6 weeks presuming prosthetic valve endocarditis. Repeat blood cultures are pending.   Subjective: Denies any complaints this morning.   Objective: BP (!) 124/56 (BP Location: Left Arm)   Pulse (!) 45   Temp 100.1 F (37.8 C) (Oral)   Resp 18   Ht 5' 5 (1.651 m)   Wt 89 kg   SpO2 92%   BMI 32.65 kg/m   Gen: No distress, pleasant and obese female Pulm: Nonlabored, clear  CV: RRR, soft systolic murmur near apex GI: Soft, NT, ND, +BS Neuro: Alert and oriented. No new focal deficits. Ext: Warm, no deformities. Skin: No rashes, lesions or ulcers on visualized skin   Telemetry (personal review): NSR with 18 beats of PSVT yesterday around 11am, asymptomatic  Assessment & Plan: Sepsis due to Enterococcal bacteremia: Unclear source as her urine culture is E. coli. In absence of identified source, we will treat presumptively for prosthetic valve endocarditis.  - TTE without vegetation. TEE presents prohibitive risk given her age, pulmonary function, obesity.  - WBC  coming down, recurrent fever this morning.  - Appreciate ID consultation. Changing to high-dose ampicillin  (has tolerated amoxicillin) and ceftriaxone  2g IV q24h, redraw blood cultures. Once repeat cultures negative x3 days, can insert PICC and complete 6 weeks IV antibiotics.  PAF:  - No AC due to hx GI bleed - Continue coreg  for HTN, has hx anesthesia-related bradycardia, will monitor telemetry. Forgoing TEE, so will continue beta blocker.    COPD, asthma: Quiescent.  - Continue prn bronchodilators  T2DM: Recent HbA1c 6.5%.  - SSI while admitted  HTN:  - Continue norvasc , coreg , bidil.   Stage IIIb CKD:  - Avoid nephrotoxins. - Note her age, do not believe she would be candidate to initiate dialysis and midline is not durable enough to receive 6 weeks antibiotics.   Thrombocytopenia: No bleeding, ?due to sepsis. Not on anticoagulation. - Monitor in AM.   Hyponatremia: Monitoring.  Bernardino KATHEE Come, MD [facility] [email or web address] 12/26/2023, 2:34 PM

## 2023-12-26 NOTE — Progress Notes (Signed)
*  PRELIMINARY RESULTS* Echocardiogram 2D Echocardiogram has been performed.  Teresa Aida PARAS 12/26/2023, 9:24 AM

## 2023-12-26 NOTE — TOC Progression Note (Signed)
 Transition of Care [facility]) - Progression Note    Patient Details  Name: Stephanie [name] MRN: [id number] Date of Birth: April 20, 1935  Transition of Care [facility]) CM/SW Contact  Sharlyne Stabs, RN Phone Number: 12/26/2023, 3:09 PM  Clinical Narrative:   [facility] with [facility] if following with ID for home antibiotics. Patient will need a HHRN. Referral sent out in the hub for discharge planning. IPCM following.     Expected Discharge Plan: Home w Home Health Services Barriers to Discharge: Continued Medical Work up    Expected Discharge Plan and Services In-house Referral: Clinical Social Work     Living arrangements for the past 2 months: Mobile Home                      Social Drivers of Health (SDOH) Interventions SDOH Screenings   Food Insecurity: No Food Insecurity (12/24/2023)  Housing: Low Risk  (12/24/2023)  Transportation Needs: No Transportation Needs (12/24/2023)  Utilities: Not At Risk (12/24/2023)  Depression (PHQ2-9): Low Risk  (03/12/2022)  Social Connections: Moderately Isolated (12/24/2023)  Tobacco Use: Medium Risk (12/24/2023)    Readmission Risk Interventions    12/26/2023    8:28 AM 09/26/2023   11:50 AM 09/04/2023    7:28 PM  Readmission Risk Prevention Plan  Transportation Screening Complete Complete Complete  PCP or Specialist Appt within 3-5 Days   Complete  HRI or Home Care Consult   Complete  Social Work Consult for Recovery Care Planning/Counseling   Complete  Palliative Care Screening   Not Applicable  Medication Review Oceanographer) Complete Complete Complete  PCP or Specialist appointment within 3-5 days of discharge  Complete   HRI or Home Care Consult Complete Complete   SW Recovery Care/Counseling Consult Complete Complete   Palliative Care Screening Not Applicable Not Applicable   Skilled Nursing Facility Not Applicable Complete

## 2023-12-27 ENCOUNTER — Other Ambulatory Visit: Payer: Self-pay

## 2023-12-27 DIAGNOSIS — A419 Sepsis, unspecified organism: Secondary | ICD-10-CM

## 2023-12-27 DIAGNOSIS — I330 Acute and subacute infective endocarditis: Secondary | ICD-10-CM

## 2023-12-27 LAB — GLUCOSE, CAPILLARY
Glucose-Capillary: 105 mg/dL — ABNORMAL HIGH (ref 70–99)
Glucose-Capillary: 111 mg/dL — ABNORMAL HIGH (ref 70–99)
Glucose-Capillary: 111 mg/dL — ABNORMAL HIGH (ref 70–99)
Glucose-Capillary: 112 mg/dL — ABNORMAL HIGH (ref 70–99)

## 2023-12-27 LAB — CULTURE, BLOOD (ROUTINE X 2)

## 2023-12-27 LAB — CBC
HCT: 34.1 % — ABNORMAL LOW (ref 36.0–46.0)
Hemoglobin: 11.1 g/dL — ABNORMAL LOW (ref 12.0–15.0)
MCH: 27.9 pg (ref 26.0–34.0)
MCHC: 32.6 g/dL (ref 30.0–36.0)
MCV: 85.7 fL (ref 80.0–100.0)
Platelets: 130 K/uL — ABNORMAL LOW (ref 150–400)
RBC: 3.98 MIL/uL (ref 3.87–5.11)
RDW: 15.7 % — ABNORMAL HIGH (ref 11.5–15.5)
WBC: 11.3 K/uL — ABNORMAL HIGH (ref 4.0–10.5)
nRBC: 0 % (ref 0.0–0.2)

## 2023-12-27 MED ORDER — CEFTRIAXONE IV (FOR PTA / DISCHARGE USE ONLY): 2.0000 g | Freq: Two times a day (BID) | INTRAVENOUS | 0 refills | Status: DC

## 2023-12-27 MED ORDER — AMPICILLIN IV (FOR PTA / DISCHARGE USE ONLY): 6.0000 g | INTRAVENOUS | 0 refills | Status: DC

## 2023-12-27 NOTE — Progress Notes (Signed)
 Pt had a good day. She has been able to ambulate to restroom with walker and steadying assist. Family visited and was supportive in care. ID spoke with patient and family. Plan is PICC line tomorrow and possible DC if home IV ABX can be set up. Kellogg RN

## 2023-12-27 NOTE — Progress Notes (Signed)
 [facility] PROGRESS NOTE  Stephanie [name] (DOB: 08-06-1935) FMW:[id number] PCP: [facility]  Brief Narrative: Stephanie [name] is an 88 y.o. female with a history of asthma, COPD, stage IIIb CKD, paroxymal atrial fibrillation, AS s/p TAVR 2022, lung CA s/p SBRT, and T2DM  who presented to the ED on 12/24/2023 with fever, weakness, malaise, fatigue for 24 hours. She was febrile on arrival to 102F  with leukocytosis (WBC 16.7k) reporting som urinary frequency and found to have pyuria suggesting UTI for which ceftriaxone  was started on admission. Blood cultures have grown E. faecalis and urine cultures have grown E. coli. Abdominopelvic CT showed no occult source of infection. ID is consulted for further recommendations for IV antimicrobial therapy. TTE showed normal functioning of TAVR without vegetation, and TEE is considered to present prohibitive risk. So we will treat with IV antibiotics for 6 weeks presuming prosthetic valve endocarditis. Repeat blood cultures are pending.   Subjective: Woke up with some wheezing that has improved through the day, currently gone. Minimal dyspnea. No chest pain or cough. Usually just takes a neb Tx at home for that, didn't know she could request one here. No fevers  Objective: BP (!) 147/69 (BP Location: Left Arm)   Pulse (!) 59   Temp 99.3 F (37.4 C) (Oral)   Resp (!) 24   Ht 5' 5 (1.651 m)   Wt 89 kg   SpO2 93%   BMI 32.65 kg/m   Gen: Pleasant obese female in no distress Pulm: No wheezes, nonlabored on room air  CV: RRR, no RG, no pitting edema GI: Soft, NT, ND, +BS Neuro: Alert and oriented. No new focal deficits. Ext: Warm, no deformities. PIV LUE Skin: No new rashes, lesions or ulcers on visualized skin   Telemetry (personal review): NSR, occasional 1st deg AVB without bradycardia.   Assessment & Plan: Sepsis due to Enterococcal bacteremia: Unclear source as her urine culture is E. coli. 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  - TTE without vegetation. TEE presents prohibitive risk given her age, pulmonary function, obesity.  - WBC coming down, T max today 99.66F.   - Appreciate ID consultation. Changing to high-dose ampicillin  (has tolerated amoxicillin) and ceftriaxone  2g IV q24h.  - Will follow blood cultures until negative x3 days, then insert PICC and DC to complete 6 weeks IV antibiotics. HHRN ordered.    PAF:  - No AC due to hx GI bleed - Continue coreg  for HTN, has hx anesthesia-related bradycardia, will monitor telemetry. Forgoing TEE, so will continue beta blocker.    COPD, asthma: Quiescent.  - Continue prn bronchodilators, urged pt to request if needed. Remains stable.   T2DM: Recent HbA1c 6.5%.  - SSI while admitted  HTN:  - Continue norvasc , coreg , bidil.   Stage IIIb CKD:  - Avoid nephrotoxins. - Confirm stability with AM labs.  - Note her age, do not believe she would be candidate to initiate dialysis and midline is not durable enough to receive 6 weeks antibiotics.   Thrombocytopenia: No bleeding, ?due to sepsis. Not on anticoagulation. Has stabilized.   Hyponatremia: Monitoring.  Bernardino KATHEE Come, MD [facility] [email or web address] 12/27/2023, 2:27 PM

## 2023-12-27 NOTE — TOC Progression Note (Signed)
 Transition of Care [facility]) - Progression Note    Patient Details  Name: Stephanie [name] MRN: [id number] Date of Birth: Jul 17, 1935  [facility]) CM/SW Contact  Hoy DELENA Bigness, LCSW Phone Number: 12/27/2023, 9:53 AM  Clinical Narrative:    [facility] w/ [facility] following for home IV abx. [facility] following for [facility]. HH order will need to be placed prior to discharge. ICM continuing to follow.    Expected Discharge Plan: Home w Home Health Services Barriers to Discharge: Continued Medical Work up               Expected Discharge Plan and Services In-house Referral: Clinical Social Work     Living arrangements for the past 2 months: Mobile Home                                       Social Drivers of Health (SDOH) Interventions SDOH Screenings   Food Insecurity: No Food Insecurity (12/24/2023)  Housing: Low Risk  (12/24/2023)  Transportation Needs: No Transportation Needs (12/24/2023)  Utilities: Not At Risk (12/24/2023)  Depression (PHQ2-9): Low Risk  (03/12/2022)  Social Connections: Moderately Isolated (12/24/2023)  Tobacco Use: Medium Risk (12/24/2023)    Readmission Risk Interventions    12/26/2023    8:28 AM 09/26/2023   11:50 AM 09/04/2023    7:28 PM  Readmission Risk Prevention Plan  Transportation Screening Complete Complete Complete  PCP or Specialist Appt within 3-5 Days   Complete  HRI or Home Care Consult   Complete  Social Work Consult for Recovery Care Planning/Counseling   Complete  Palliative Care Screening   Not Applicable  Medication Review Oceanographer) Complete Complete Complete  PCP or Specialist appointment within 3-5 days of discharge  Complete   HRI or Home Care Consult Complete Complete   SW Recovery Care/Counseling Consult Complete Complete   Palliative Care Screening Not Applicable Not Applicable   Skilled Nursing Facility Not Applicable Complete

## 2023-12-27 NOTE — Progress Notes (Signed)
 PHARMACY CONSULT NOTE FOR:  OUTPATIENT  PARENTERAL ANTIBIOTIC THERAPY (OPAT)  Indication: Enterococcal endocarditis Regimen: Ampicillin  6 gm every 24 hours as a continuous infusion + Ceftriaxone  2 gm IV Q 12 hours  End date: 02/05/24  Method of administration of ampicillin  may be changed by home health depending on family preference for continuous infusion vs intermittent infusion.    IV antibiotic discharge orders are pended. To discharging provider:  please sign these orders via discharge navigator,  Select New Orders & click on the button choice - Manage This Unsigned Work.     Thank you for allowing pharmacy to be a part of this patient's care.  Damien Quiet, PharmD, BCPS, BCIDP Infectious Diseases Clinical Pharmacist Phone: [phone] 12/27/2023, 1:40 PM

## 2023-12-27 NOTE — Progress Notes (Signed)
 Virtual Visit via Video Note  I connected with Stephanie [name] on @TODAY @ at  by a video enabled telemedicine application and verified that I am speaking with the correct person using two identifiers.  Location: Patient: [facility] Provider: Darryle Law   I discussed the limitations of evaluation and management by telemedicine and the availability of in person appointments. The patient expressed understanding and agreed to proceed.  History of Present Illness   Subjective: No new complaints   Antibiotics:  Anti-infectives (From admission, onward)    Start     Dose/Rate Route Frequency Ordered Stop   12/27/23 0000  ampicillin  IVPB        6 g Intravenous Every 24 hours 12/27/23 1344 02/05/24 2359   12/27/23 0000  cefTRIAXone  (ROCEPHIN ) IVPB        2 g Intravenous Every 12 hours 12/27/23 1344 02/05/24 2359   12/26/23 2200  cefTRIAXone  (ROCEPHIN ) 2 g in sodium chloride  0.9 % 100 mL IVPB        2 g 200 mL/hr over 30 Minutes Intravenous Every 12 hours 12/26/23 1339     12/26/23 1430  ampicillin  (OMNIPEN) 2 g in sodium chloride  0.9 % 100 mL IVPB        2 g 300 mL/hr over 20 Minutes Intravenous Every 8 hours 12/26/23 1339     12/26/23 1000  DAPTOmycin  (CUBICIN ) IVPB 700 mg/100mL premix  Status:  Discontinued        8 mg/kg  89 kg 200 mL/hr over 30 Minutes Intravenous Every 48 hours 12/25/23 1446 12/26/23 1339   12/26/23 0800  vancomycin  (VANCOCIN ) IVPB 1000 mg/200 mL premix  Status:  Discontinued        1,000 mg 200 mL/hr over 60 Minutes Intravenous Every 24 hours 12/25/23 1200 12/25/23 1430   12/26/23 0800  cefTRIAXone  (ROCEPHIN ) 2 g in sodium chloride  0.9 % 100 mL IVPB  Status:  Discontinued        2 g 200 mL/hr over 30 Minutes Intravenous Every 24 hours 12/26/23 0708 12/26/23 1339   12/25/23 1800  cefTRIAXone  (ROCEPHIN ) 2 g in sodium chloride  0.9 % 100 mL IVPB  Status:  Discontinued        2 g 200 mL/hr over 30 Minutes Intravenous Every 24 hours 12/24/23 1915 12/25/23  0453   12/25/23 1800  cefTRIAXone  (ROCEPHIN ) 2 g in sodium chloride  0.9 % 100 mL IVPB  Status:  Discontinued        2 g 200 mL/hr over 30 Minutes Intravenous Every 24 hours 12/25/23 0453 12/25/23 0453   12/25/23 0845  vancomycin  (VANCOREADY) IVPB 1750 mg/350 mL        1,750 mg 175 mL/hr over 120 Minutes Intravenous  Once 12/25/23 0753 12/25/23 1515   12/25/23 0600  cefTRIAXone  (ROCEPHIN ) 2 g in sodium chloride  0.9 % 100 mL IVPB  Status:  Discontinued        2 g 200 mL/hr over 30 Minutes Intravenous Every 24 hours 12/25/23 0453 12/25/23 1218   12/24/23 1730  cefTRIAXone  (ROCEPHIN ) 1 g in sodium chloride  0.9 % 100 mL IVPB        1 g 200 mL/hr over 30 Minutes Intravenous  Once 12/24/23 1725 12/24/23 1812       Medications: Scheduled Meds:  amLODipine   2.5 mg Oral Daily   carvedilol   6.25 mg Oral BID WC   cinacalcet   30 mg Oral Q breakfast   ferrous sulfate   325 mg Oral Daily  gabapentin   100-200 mg Oral QHS   heparin   5,000 Units Subcutaneous Q8H   hydrALAZINE   50 mg Oral TID   insulin  aspart  0-5 Units Subcutaneous QHS   insulin  aspart  0-6 Units Subcutaneous TID WC   isosorbide  mononitrate  30 mg Oral Daily   sodium chloride  flush  3 mL Intravenous Q12H   sodium chloride  flush  3 mL Intravenous Q12H   Continuous Infusions:  ampicillin  (OMNIPEN) IV 2 g (12/27/23 1445)   cefTRIAXone  (ROCEPHIN )  IV 2 g (12/27/23 0910)   PRN Meds:.acetaminophen  **OR** acetaminophen , albuterol , bisacodyl , labetalol , ondansetron  **OR** ondansetron  (ZOFRAN ) IV, polyethylene glycol, prochlorperazine , sodium chloride  flush, traZODone     Objective: Weight change:  No intake or output data in the 24 hours ending 12/27/23 1630 Blood pressure (!) 147/69, pulse (!) 59, temperature 99.3 F (37.4 C), temperature source Oral, resp. rate (!) 24, height 5' 5 (1.651 m), weight 89 kg, SpO2 93%. Temp:  [98.2 F (36.8 C)-99.7 F (37.6 C)] 99.3 F (37.4 C) (12/09 1313) Pulse Rate:  [59-86] 59 (12/09  1313) Resp:  [19-24] 24 (12/09 1100) BP: (147-162)/(69-100) 147/69 (12/09 1313) SpO2:  [93 %-99 %] 93 % (12/09 1313)  Physical Exam: Physical Exam Constitutional:      General: She is not in acute distress.    Appearance: She is well-developed. She is not diaphoretic.  HENT:     Head: Normocephalic and atraumatic.     Right Ear: External ear normal.     Left Ear: External ear normal.     Mouth/Throat:     Pharynx: No oropharyngeal exudate.  Eyes:     General: No scleral icterus.    Conjunctiva/sclera: Conjunctivae normal.     Pupils: Pupils are equal, round, and reactive to light.  Cardiovascular:     Rate and Rhythm: Normal rate and regular rhythm.  Pulmonary:     Effort: Pulmonary effort is normal. No respiratory distress.     Breath sounds: No wheezing.  Abdominal:     General: There is no distension.     Palpations: Abdomen is soft.  Musculoskeletal:        General: No tenderness. Normal range of motion.  Lymphadenopathy:     Cervical: No cervical adenopathy.  Skin:    General: Skin is warm and dry.     Coloration: Skin is not pale.     Findings: No erythema or rash.  Neurological:     General: No focal deficit present.     Mental Status: She is alert and oriented to person, place, and time.     Motor: No abnormal muscle tone.     Coordination: Coordination normal.  Psychiatric:        Mood and Affect: Mood normal.        Behavior: Behavior normal.        Thought Content: Thought content normal.        Judgment: Judgment normal.      CBC:    BMET Recent Labs    12/25/23 0512 12/26/23 0510  NA 133* 133*  K 4.1 3.8  CL 98 98  CO2 28 21*  GLUCOSE 116* 95  BUN 16 21  CREATININE 1.65* 1.69*  CALCIUM  8.9 8.9     Liver Panel  Recent Labs    12/25/23 0512  ALBUMIN 3.4*       Sedimentation Rate No results for input(s): ESRSEDRATE in the last 72 hours. C-Reactive Protein No results for input(s): CRP in the last 72 hours.  Micro  Results: Recent Results (from the past 720 hours)  Resp panel by RT-PCR (RSV, Flu A&B, Covid) Anterior Nasal Swab     Status: None   Collection Time: 12/24/23  2:27 PM   Specimen: Anterior Nasal Swab  Result Value Ref Range Status   SARS Coronavirus 2 by RT PCR NEGATIVE NEGATIVE Final    Comment: (NOTE) SARS-CoV-2 target nucleic acids are NOT DETECTED.  The SARS-CoV-2 RNA is generally detectable in upper respiratory specimens during the acute phase of infection. The lowest concentration of SARS-CoV-2 viral copies this assay can detect is 138 copies/mL. A negative result does not preclude SARS-Cov-2 infection and should not be used as the sole basis for treatment or other patient management decisions. A negative result may occur with  improper specimen collection/handling, submission of specimen other than nasopharyngeal swab, presence of viral mutation(s) within the areas targeted by this assay, and inadequate number of viral copies(<138 copies/mL). A negative result must be combined with clinical observations, patient history, and epidemiological information. The expected result is Negative.  Fact Sheet for Patients:  bloggercourse.com  Fact Sheet for Healthcare Providers:  seriousbroker.it  This test is no t yet approved or cleared by the [place]  FDA and  has been authorized for detection and/or diagnosis of SARS-CoV-2 by FDA under an Emergency Use Authorization (EUA). This EUA will remain  in effect (meaning this test can be used) for the duration of the COVID-19 declaration under Section 564(b)(1) of the Act, 21 U.S.C.section 360bbb-3(b)(1), unless the authorization is terminated  or revoked sooner.       Influenza A by PCR NEGATIVE NEGATIVE Final   Influenza B by PCR NEGATIVE NEGATIVE Final    Comment: (NOTE) The Xpert Xpress SARS-CoV-2/FLU/RSV plus assay is intended as an aid in the diagnosis of influenza from  Nasopharyngeal swab specimens and should not be used as a sole basis for treatment. Nasal washings and aspirates are unacceptable for Xpert Xpress SARS-CoV-2/FLU/RSV testing.  Fact Sheet for Patients: bloggercourse.com  Fact Sheet for Healthcare Providers: seriousbroker.it  This test is not yet approved or cleared by the [place]  FDA and has been authorized for detection and/or diagnosis of SARS-CoV-2 by FDA under an Emergency Use Authorization (EUA). This EUA will remain in effect (meaning this test can be used) for the duration of the COVID-19 declaration under Section 564(b)(1) of the Act, 21 U.S.C. section 360bbb-3(b)(1), unless the authorization is terminated or revoked.     Resp Syncytial Virus by PCR NEGATIVE NEGATIVE Final    Comment: (NOTE) Fact Sheet for Patients: bloggercourse.com  Fact Sheet for Healthcare Providers: seriousbroker.it  This test is not yet approved or cleared by the [place]  FDA and has been authorized for detection and/or diagnosis of SARS-CoV-2 by FDA under an Emergency Use Authorization (EUA). This EUA will remain in effect (meaning this test can be used) for the duration of the COVID-19 declaration under Section 564(b)(1) of the Act, 21 U.S.C. section 360bbb-3(b)(1), unless the authorization is terminated or revoked.  Performed at [facility], [address], [place], [place] [address]   Culture, blood (routine x 2)     Status: Abnormal   Collection Time: 12/24/23  3:42 PM   Specimen: BLOOD LEFT ARM  Result Value Ref Range Status   Specimen Description   Final    BLOOD LEFT ARM BOTTLES DRAWN AEROBIC AND ANAEROBIC Performed at [facility], [address], [place], [place] [address]    Special Requests   Final  Blood Culture results may not be optimal due to an inadequate volume of blood received in culture bottles Performed at  [facility], [address], [place], [place] [address]    Culture  Setup Time   Final    GRAM POSITIVE COCCI IN BOTH AEROBIC AND ANAEROBIC BOTTLES CRITICAL RESULT CALLED TO, READ BACK BY AND VERIFIED WITH: ANABEL,RN ON 12/25/23 AT 0430 BY PURDIE,J CRITICAL VALUE NOTED.  VALUE IS CONSISTENT WITH PREVIOUSLY REPORTED AND CALLED VALUE. Performed at [facility], [address] [place], [place] [address]    Culture (A)  Final    ENTEROCOCCUS FAECALIS SUSCEPTIBILITIES PERFORMED ON PREVIOUS CULTURE WITHIN THE LAST 5 DAYS. Performed at [facility] [facility], [address], [place], [place] [address]    Report Status 12/27/2023 FINAL  Final  Culture, blood (routine x 2)     Status: Abnormal   Collection Time: 12/24/23  4:09 PM   Specimen: Left Antecubital; Blood  Result Value Ref Range Status   Specimen Description   Final    LEFT ANTECUBITAL BOTTLES DRAWN AEROBIC AND ANAEROBIC Performed at [facility], [address] [place], [place] [address]    Special Requests   Final    Blood Culture results may not be optimal due to an inadequate volume of blood received in culture bottles Performed at [facility], [address], [place], [place] [address]    Culture  Setup Time   Final    GRAM POSITIVE COCCI IN BOTH AEROBIC AND ANAEROBIC BOTTLES CRITICAL RESULT CALLED TO, READ BACK BY AND VERIFIED WITH: ANABEL,RN ON 12/25/23 AT 0430 BY PURDIE,J CRITICAL RESULT CALLED TO, READ BACK BY AND VERIFIED WITH: RN ARMOND [id number] AT 1020 BY EC Performed at [facility], [address], [place], [place] [address]    Culture ENTEROCOCCUS FAECALIS (A)  Final   Report Status 12/27/2023 FINAL  Final   Organism ID, Bacteria ENTEROCOCCUS FAECALIS  Final      Susceptibility   Enterococcus faecalis - MIC*    AMPICILLIN  <=2 SENSITIVE Sensitive     VANCOMYCIN  1 SENSITIVE Sensitive     GENTAMICIN SYNERGY SENSITIVE Sensitive     * ENTEROCOCCUS FAECALIS  Blood Culture ID Panel (Reflexed)     Status:  Abnormal   Collection Time: 12/24/23  4:09 PM  Result Value Ref Range Status   Enterococcus faecalis DETECTED (A) NOT DETECTED Final    Comment: CRITICAL RESULT CALLED TO, READ BACK BY AND VERIFIED WITH: RN ARMOND [id number] AT 1020 BY EC    Enterococcus Faecium NOT DETECTED NOT DETECTED Final   Listeria monocytogenes NOT DETECTED NOT DETECTED Final   Staphylococcus species NOT DETECTED NOT DETECTED Final   Staphylococcus aureus (BCID) NOT DETECTED NOT DETECTED Final   Staphylococcus epidermidis NOT DETECTED NOT DETECTED Final   Staphylococcus lugdunensis NOT DETECTED NOT DETECTED Final   Streptococcus species NOT DETECTED NOT DETECTED Final   Streptococcus agalactiae NOT DETECTED NOT DETECTED Final   Streptococcus pneumoniae NOT DETECTED NOT DETECTED Final   Streptococcus pyogenes NOT DETECTED NOT DETECTED Final   A.calcoaceticus-baumannii NOT DETECTED NOT DETECTED Final   Bacteroides fragilis NOT DETECTED NOT DETECTED Final   Enterobacterales NOT DETECTED NOT DETECTED Final   Enterobacter cloacae complex NOT DETECTED NOT DETECTED Final   Escherichia coli NOT DETECTED NOT DETECTED Final   Klebsiella aerogenes NOT DETECTED NOT DETECTED Final   Klebsiella oxytoca NOT DETECTED NOT DETECTED Final   Klebsiella pneumoniae NOT DETECTED NOT DETECTED Final   Proteus species NOT DETECTED  NOT DETECTED Final   Salmonella species NOT DETECTED NOT DETECTED Final   Serratia marcescens NOT DETECTED NOT DETECTED Final   Haemophilus influenzae NOT DETECTED NOT DETECTED Final   Neisseria meningitidis NOT DETECTED NOT DETECTED Final   Pseudomonas aeruginosa NOT DETECTED NOT DETECTED Final   Stenotrophomonas maltophilia NOT DETECTED NOT DETECTED Final   Candida albicans NOT DETECTED NOT DETECTED Final   Candida auris NOT DETECTED NOT DETECTED Final   Candida glabrata NOT DETECTED NOT DETECTED Final   Candida krusei NOT DETECTED NOT DETECTED Final   Candida parapsilosis NOT DETECTED NOT DETECTED Final    Candida tropicalis NOT DETECTED NOT DETECTED Final   Cryptococcus neoformans/gattii NOT DETECTED NOT DETECTED Final   Vancomycin  resistance NOT DETECTED NOT DETECTED Final    Comment: Performed at [facility], [address] [place], [place] [address]  Urine Culture     Status: Abnormal   Collection Time: 12/24/23  4:25 PM   Specimen: Urine, Clean Catch  Result Value Ref Range Status   Specimen Description   Final    URINE, CLEAN CATCH Performed at [facility], [address] [place], [place] [address]    Special Requests   Final    NONE Performed at [facility], [address], [place], [place] [address]    Culture >=100,000 COLONIES/mL ESCHERICHIA COLI (A)  Final   Report Status 12/26/2023 FINAL  Final   Organism ID, Bacteria ESCHERICHIA COLI (A)  Final      Susceptibility   Escherichia coli - MIC*    AMPICILLIN  >=32 RESISTANT Resistant     CEFAZOLIN  (URINE) Value in next row Sensitive      2 SENSITIVEThis is a modified FDA-approved test that has been validated and its performance characteristics determined by the reporting laboratory.  This laboratory is certified under the Clinical Laboratory Improvement Amendments CLIA as qualified to perform high complexity clinical laboratory testing.    CEFEPIME Value in next row Sensitive      2 SENSITIVEThis is a modified FDA-approved test that has been validated and its performance characteristics determined by the reporting laboratory.  This laboratory is certified under the Clinical Laboratory Improvement Amendments CLIA as qualified to perform high complexity clinical laboratory testing.    ERTAPENEM Value in next row Sensitive      2 SENSITIVEThis is a modified FDA-approved test that has been validated and its performance characteristics determined by the reporting laboratory.  This laboratory is certified under the Clinical Laboratory Improvement Amendments CLIA as qualified to perform high complexity clinical laboratory  testing.    CEFTRIAXONE  Value in next row Sensitive      2 SENSITIVEThis is a modified FDA-approved test that has been validated and its performance characteristics determined by the reporting laboratory.  This laboratory is certified under the Clinical Laboratory Improvement Amendments CLIA as qualified to perform high complexity clinical laboratory testing.    CIPROFLOXACIN  Value in next row Intermediate      2 SENSITIVEThis is a modified FDA-approved test that has been validated and its performance characteristics determined by the reporting laboratory.  This laboratory is certified under the Clinical Laboratory Improvement Amendments CLIA as qualified to perform high complexity clinical laboratory testing.    GENTAMICIN Value in next row Sensitive      2 SENSITIVEThis is a modified FDA-approved test that has been validated and its performance characteristics determined by the reporting laboratory.  This laboratory is certified under the Clinical Laboratory Improvement Amendments CLIA as qualified to perform high  complexity clinical laboratory testing.    NITROFURANTOIN Value in next row Sensitive      2 SENSITIVEThis is a modified FDA-approved test that has been validated and its performance characteristics determined by the reporting laboratory.  This laboratory is certified under the Clinical Laboratory Improvement Amendments CLIA as qualified to perform high complexity clinical laboratory testing.    TRIMETH/SULFA Value in next row Resistant      2 SENSITIVEThis is a modified FDA-approved test that has been validated and its performance characteristics determined by the reporting laboratory.  This laboratory is certified under the Clinical Laboratory Improvement Amendments CLIA as qualified to perform high complexity clinical laboratory testing.    AMPICILLIN /SULBACTAM Value in next row Sensitive      2 SENSITIVEThis is a modified FDA-approved test that has been validated and its performance  characteristics determined by the reporting laboratory.  This laboratory is certified under the Clinical Laboratory Improvement Amendments CLIA as qualified to perform high complexity clinical laboratory testing.    PIP/TAZO Value in next row Sensitive      <=4 SENSITIVEThis is a modified FDA-approved test that has been validated and its performance characteristics determined by the reporting laboratory.  This laboratory is certified under the Clinical Laboratory Improvement Amendments CLIA as qualified to perform high complexity clinical laboratory testing.    MEROPENEM  Value in next row Sensitive      <=4 SENSITIVEThis is a modified FDA-approved test that has been validated and its performance characteristics determined by the reporting laboratory.  This laboratory is certified under the Clinical Laboratory Improvement Amendments CLIA as qualified to perform high complexity clinical laboratory testing.    * >=100,000 COLONIES/mL ESCHERICHIA COLI  Culture, blood (Routine X 2) w Reflex to ID Panel     Status: None (Preliminary result)   Collection Time: 12/25/23 10:51 AM   Specimen: BLOOD  Result Value Ref Range Status   Specimen Description BLOOD LEFT ANTECUBITAL  Final   Special Requests   Final    BOTTLES DRAWN AEROBIC AND ANAEROBIC Blood Culture adequate volume   Culture   Final    NO GROWTH 2 DAYS Performed at [facility], [address], [place], [place] [address]    Report Status PENDING  Incomplete  Culture, blood (Routine X 2) w Reflex to ID Panel     Status: None (Preliminary result)   Collection Time: 12/25/23 10:51 AM   Specimen: BLOOD  Result Value Ref Range Status   Specimen Description BLOOD BLOOD LEFT HAND  Final   Special Requests   Final    BOTTLES DRAWN AEROBIC AND ANAEROBIC Blood Culture adequate volume   Culture   Final    NO GROWTH 2 DAYS Performed at [facility], [address] [place], [place] [address]    Report Status PENDING  Incomplete  Culture, blood  (Routine X 2) w Reflex to ID Panel     Status: None (Preliminary result)   Collection Time: 12/26/23  3:05 PM   Specimen: BLOOD  Result Value Ref Range Status   Specimen Description BLOOD BLOOD LEFT ARM AEROBIC BOTTLE ONLY  Final   Special Requests Blood Culture adequate volume  Final   Culture   Final    NO GROWTH < 24 HOURS Performed at [facility], [address] [place], [place] [address]    Report Status PENDING  Incomplete  Culture, blood (Routine X 2) w Reflex to ID Panel     Status: None (Preliminary result)   Collection Time: 12/26/23  3:05 PM   Specimen: BLOOD  Result Value Ref Range Status   Specimen Description BLOOD AEROBIC BOTTLE ONLY LFOA  Final   Special Requests Blood Culture adequate volume  Final   Culture   Final    NO GROWTH < 24 HOURS Performed at [facility], [address] [place], [place] [address]    Report Status PENDING  Incomplete    Studies/Results: US  EKG SITE RITE Result Date: 12/27/2023 If Site Rite image not attached, placement could not be confirmed due to current cardiac rhythm.  ECHOCARDIOGRAM COMPLETE Result Date: 12/26/2023    ECHOCARDIOGRAM REPORT   Patient Name:   Stephanie [name] Date of Exam: 12/26/2023 Medical Rec #:  [id number]         Height:       65.0 in Accession #:    [id number]        Weight:       196.2 lb Date of Birth:  Mar 06, 1935         BSA:          1.962 m Patient Age:    88 years          BP:           145/62 mmHg Patient Gender: F                 HR:           89 bpm. Exam Location:  [facility] Procedure: 2D Echo, Cardiac Doppler and Color Doppler (Both Spectral and Color            Flow Doppler were utilized during procedure). Indications:    Bacteremia R78.81                 Post TAVR evaluation Z95.2  History:        Patient has prior history of Echocardiogram examinations, most                 recent 10/31/2023. Arrythmias:Atrial Fibrillation; Risk                 Factors:Hypertension, Diabetes and Dyslipidemia. 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  Sonographer:    Aida Pizza RCS Referring Phys: [phone] BERNARDINO KATHEE COME IMPRESSIONS  1. Left ventricular ejection fraction, by estimation, is 60 to 65%. The left ventricle has normal function. The left ventricle has no regional wall motion abnormalities. There is severe left ventricular hypertrophy. Left ventricular diastolic parameters  are consistent with Grade I diastolic dysfunction (impaired relaxation).  2. Right ventricular systolic function is normal. The right ventricular size is normal.  3. Left atrial size was moderately dilated.  4. The mitral valve is normal in structure. No evidence of mitral valve regurgitation. No evidence of mitral stenosis. Moderate mitral annular calcification.  5. Edwards Sapien 3 Ultra THV size 26 mm is in the AV position. . The aortic valve has been repaired/replaced. Aortic valve regurgitation is not visualized. No aortic stenosis is present.  6. The inferior vena cava is normal in size with greater than 50% respiratory variability, suggesting right atrial pressure of 3 mmHg. FINDINGS  Left Ventricle: Left ventricular ejection fraction, by estimation, is 60 to 65%. The left ventricle has normal function. The left ventricle has no  regional wall motion abnormalities. The left ventricular internal cavity size was normal in size. There is  severe left ventricular hypertrophy. Left ventricular diastolic parameters are consistent with Grade I diastolic dysfunction (impaired relaxation). Right Ventricle: The right ventricular size is normal. Right vetricular wall thickness was not well visualized. Right ventricular systolic function is normal. Left Atrium: Left atrial size was moderately dilated. Right Atrium: Right atrial size was normal in size. Pericardium: There is no evidence of pericardial effusion. Mitral Valve: The mitral valve is normal in structure. Moderate mitral  annular calcification. No evidence of mitral valve regurgitation. No evidence of mitral valve stenosis. Tricuspid Valve: The tricuspid valve is normal in structure. Tricuspid valve regurgitation is not demonstrated. No evidence of tricuspid stenosis. Aortic Valve: Edwards Sapien 3 Ultra THV size 26 mm is in the AV position. The aortic valve has been repaired/replaced. Aortic valve regurgitation is not visualized. No aortic stenosis is present. Aortic valve mean gradient measures 9.0 mmHg. Aortic valve peak gradient measures 17.1 mmHg. Pulmonic Valve: The pulmonic valve was not well visualized. Pulmonic valve regurgitation is not visualized. No evidence of pulmonic stenosis. Aorta: The aortic root is normal in size and structure and the ascending aorta was not well visualized. Venous: The inferior vena cava is normal in size with greater than 50% respiratory variability, suggesting right atrial pressure of 3 mmHg. IAS/Shunts: No atrial level shunt detected by color flow Doppler.  LEFT VENTRICLE PLAX 2D LVIDd:         4.40 cm LVIDs:         2.00 cm LV PW:         1.60 cm LV IVS:        1.80 cm  RIGHT VENTRICLE TAPSE (M-mode): 1.9 cm LEFT ATRIUM              Index        RIGHT ATRIUM           Index LA diam:        4.80 cm  2.45 cm/m   RA Area:     21.60 cm LA Vol (A2C):   108.0 ml 55.04 ml/m  RA Volume:   63.50 ml  32.36 ml/m LA Vol (A4C):   68.6 ml  34.96 ml/m LA Biplane Vol: 86.3 ml  43.98 ml/m  AORTIC VALVE AV Vmax:           207.00 cm/s AV Vmean:          137.200 cm/s AV VTI:            0.385 m AV Peak Grad:      17.1 mmHg AV Mean Grad:      9.0 mmHg LVOT Vmax:         101.20 cm/s LVOT Vmean:        80.750 cm/s LVOT VTI:          0.209 m LVOT/AV VTI ratio: 0.54  AORTA Ao Root diam: 3.00 cm MITRAL VALVE MV Area (PHT): 4.68 cm     SHUNTS MV Decel Time: 162 msec     Systemic VTI: 0.21 m MV E velocity: 138.00 cm/s MV A velocity: 160.00 cm/s MV E/A ratio:  0.86 Dorn Ross MD Electronically signed by  Dorn Ross MD Signature Date/Time: 12/26/2023/10:49:18 AM    Final       Assessment/Plan:  INTERVAL HISTORY: patient had no difficulties with AMP and ceftriaxone    Principal Problem:   Enterococcal bacteremia Active Problems:   Type 2 diabetes  mellitus with chronic kidney disease, with long-term current use of insulin  (HCC)   UTI (urinary tract infection)   DM (diabetes mellitus) (HCC)   S/P TAVR (transcatheter aortic valve replacement)   COPD (chronic obstructive pulmonary disease) (HCC)   Chronic heart failure with preserved ejection fraction (HFpEF) (HCC)   Paroxysmal atrial fibrillation (HCC)   Hyponatremia    Stephanie [name] is a 88 y.o. 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    Labs weekly while on IV antibiotics: _x_ CBC with differential _x_ BMP __ CMP __ CRP __ ESR __ Vancomycin  trough __ CK  x__ Please pull PIC at completion of IV antibiotics __ Please leave PIC in place until doctor has seen patient or been notified  Fax weekly labs to [phone]  Clinic Follow Up Appt:   Stephanie [name] has an appointment on 01/30/2024 at 915AM with Corean Fireman, NP  at  [facility], which  is located in the [facility] at  [address] [place] in [place].  [address], which is located to the left of the elevators.  Phone: [phone]  Fax: [phone]  [email or web address]  The patient should arrive 30 minutes prior to their appoitment.   I personally spent a total of 50 minutes in the care of the patient today including preparing to see the patient, getting/reviewing separately obtained history, performing a medically appropriate exam/evaluation, counseling and educating, placing orders, referring and communicating with other health care professionals, and documenting clinical information in the EHR.   Evaluation of the patient requires complex antimicrobial therapy evaluation, counseling , isolation needs to reduce disease transmission and risk assessment and mitigation.      LOS: 2 days   Stephanie [name] 12/27/2023, 4:30 PM

## 2023-12-28 LAB — BASIC METABOLIC PANEL WITH GFR
Anion gap: 8 (ref 5–15)
BUN: 15 mg/dL (ref 8–23)
CO2: 27 mmol/L (ref 22–32)
Calcium: 9 mg/dL (ref 8.9–10.3)
Chloride: 100 mmol/L (ref 98–111)
Creatinine, Ser: 1.42 mg/dL — ABNORMAL HIGH (ref 0.44–1.00)
GFR, Estimated: 36 mL/min — ABNORMAL LOW (ref >60)
Glucose, Bld: 109 mg/dL — ABNORMAL HIGH (ref 70–99)
Potassium: 4.1 mmol/L (ref 3.5–5.1)
Sodium: 136 mmol/L (ref 135–145)

## 2023-12-28 LAB — GLUCOSE, CAPILLARY
Glucose-Capillary: 120 mg/dL — ABNORMAL HIGH (ref 70–99)
Glucose-Capillary: 149 mg/dL — ABNORMAL HIGH (ref 70–99)

## 2023-12-28 MED ORDER — SODIUM CHLORIDE 0.9% FLUSH: 10.0000 mL | INTRAVENOUS | Status: DC | PRN

## 2023-12-28 MED ORDER — CHLORHEXIDINE GLUCONATE CLOTH 2 % EX PADS: 6.0000 | MEDICATED_PAD | Freq: Every day | CUTANEOUS | Status: DC

## 2023-12-28 MED ORDER — SODIUM CHLORIDE 0.9% FLUSH: 10.0000 mL | Freq: Two times a day (BID) | INTRAVENOUS | Status: DC

## 2023-12-28 NOTE — Discharge Summary (Signed)
 Physician Discharge Summary  Stephanie [name] FMW:[id number] DOB: [birth year] DOA: 12/24/2023  PCP: [facility]  Admit date: 12/24/2023 Discharge date: 12/28/2023  Admitted From: Home Disposition: Home with [facility]  Recommendations for Outpatient Follow-up:  Continue IV therapy at home via PICC line, as prescribed by infectious disease Follow up with PCP in 1 week .  Monitor blood sugars, hemoglobin A1c is 6.4, not on any therapy Patient has an appointment on 01/30/2024 with infectious disease. Weekly CBC, BMP while on antibiotic therapy, remove PICC after completion of IV antibiotic therapy. Follow up in ED if symptoms worsen or new appear   Home Health: No Equipment/Devices: None  Discharge Condition: Stable CODE STATUS: Full Diet recommendation: Heart healthy  Brief/Interim Summary:  Brief Narrative: Stephanie [name] is an 88 y.o. female with a history of asthma, COPD, stage IIIb CKD, paroxymal atrial fibrillation, AS s/p TAVR 2022, lung CA s/p SBRT, and T2DM  who presented to the ED on 12/24/2023 with fever, weakness, malaise, fatigue for 24 hours. She was febrile on arrival to 102F  with leukocytosis (WBC 16.7k) reporting som urinary frequency and found to have pyuria suggesting UTI for which ceftriaxone  was started on admission. Blood cultures have grown E. faecalis and urine cultures have grown E. coli. Abdominopelvic CT showed no occult source of infection. ID is consulted for further recommendations for IV antimicrobial therapy. TTE showed normal functioning of TAVR without vegetation, and TEE is considered to present prohibitive risk. So we will treat with IV antibiotics for 6 weeks presuming prosthetic valve endocarditis. Repeat blood cultures are negative.       Assessment & Plan: Sepsis due to Enterococcal bacteremia: Unclear source as her urine culture is E. coli. In absence of identified source, we will treat presumptively for prosthetic valve  endocarditis.  - TTE without vegetation. TEE presents prohibitive risk given her age, pulmonary function, obesity.    - Appreciate ID consultation. Changing to high-dose ampicillin  (has tolerated amoxicillin) and ceftriaxone  2g IV q24h. plan for 6 weeks of IV antibiotic therapy at home, PICC to be inserted 12/10. - Anticipated stop date 02/05/2023    PAF:  - No AC due to hx GI bleed - Continue coreg  for HTN, has hx anesthesia-related bradycardia, will monitor telemetry. Forgoing TEE, so will continue beta blocker.     COPD, asthma: Quiescent.  - Continue Stiolto, prn bronchodilators   T2DM: Recent HbA1c 6.5%.  - SSI while admitted -Not on oral hypoglycemics or insulin  therapy at home.  Follow-up with PCP   HTN:  - Continue norvasc , coreg , bidil.    Stage IIIb CKD:  - Avoid nephrotoxins. - Note her age, do not believe she would be candidate to initiate dialysis and midline is not durable enough to receive 6 weeks antibiotics.  PICC line to be placed 12/10.   Thrombocytopenia: No bleeding, ?due to sepsis. Not on anticoagulation. Has stabilized.    Hyponatremia: Improved. Discharge Diagnoses:  Principal Problem:   Enterococcal bacteremia Active Problems:   Type 2 diabetes mellitus with chronic kidney disease, with long-term current use of insulin  (HCC)   UTI (urinary tract infection)   DM (diabetes mellitus) (HCC)   S/P TAVR (transcatheter aortic valve replacement)   COPD (chronic obstructive pulmonary disease) (HCC)   Chronic heart failure with preserved ejection fraction (HFpEF) (HCC)   Paroxysmal atrial fibrillation (HCC)   Hyponatremia   Prosthetic valve endocarditis   Sepsis without acute organ dysfunction [facility])    Discharge Instructions  Discharge Instructions  [facility] pharmacist to adjust dose for Vancomycin , Aminoglycosides and other anti-infective therapies as requested by physician.   Complete by: As directed    [facility] to  provide Cath Flo 2mg    Complete by: As directed    Administer for PICC line occlusion and as ordered by physician for other access device issues.   Anaphylaxis Kit: Provided to treat any anaphylactic reaction to the medication being provided to the patient if First Dose or when requested by physician   Complete by: As directed    Epinephrine  1mg /ml vial / amp: Administer 0.3mg  (0.82ml) subcutaneously once for moderate to severe anaphylaxis, nurse to call physician and pharmacy when reaction occurs and call 911 if needed for immediate care   Diphenhydramine 50mg /ml IV vial: Administer 25-50mg  IV/IM PRN for first dose reaction, rash, itching, mild reaction, nurse to call physician and pharmacy when reaction occurs   Sodium Chloride  0.9% NS 500ml IV: Administer if needed for hypovolemic blood pressure drop or as ordered by physician after call to physician with anaphylactic reaction   Call MD for:  extreme fatigue   Complete by: As directed    Call MD for:  hives   Complete by: As directed    Call MD for:  persistant dizziness or light-headedness   Complete by: As directed    Call MD for:  persistant nausea and vomiting   Complete by: As directed    Call MD for:  temperature >100.4   Complete by: As directed    Change dressing on IV access line weekly and PRN   Complete by: As directed    Diet - low sodium heart healthy   Complete by: As directed    Discharge instructions   Complete by: As directed    1: Complete IV antibiotic therapy at home via PICC line as prescribed by infectious disease. 2.  Call provider or return to the ER if fever, dizziness, shortness of breath or chest pain.   Flush IV access with Sodium Chloride  0.9% and Heparin  10 units/ml or 100 units/ml   Complete by: As directed    Home Health   Complete by: As directed    To provide the following care/treatments: RN   Home infusion instructions - [facility]   Complete by: As directed    Instructions: Flush IV  access with Sodium Chloride  0.9% and Heparin  10units/ml or 100units/ml   Change dressing on IV access line: Weekly and PRN   Instructions Cath Flo 2mg : Administer for PICC Line occlusion and as ordered by physician for other access device   [facility] pharmacist to adjust dose for: Vancomycin , Aminoglycosides and other anti-infective therapies as requested by physician   Increase activity slowly   Complete by: As directed    Method of administration may be changed at the discretion of home infusion pharmacist based upon assessment of the patient and/or caregivers ability to self-administer the medication ordered   Complete by: As directed    No wound care   Complete by: As directed       Allergies as of 12/28/2023       Reactions   Nitrofurantoin Nausea And Vomiting   Sulfa Antibiotics Swelling   Lip swelling        Medication List     TAKE these medications    albuterol  (2.5 MG/3ML) 0.083% nebulizer solution Commonly known as: PROVENTIL  Take 3 mLs (2.5 mg total) by nebulization every 2 (two) hours as needed for wheezing  or shortness of breath.   amLODipine  2.5 MG tablet Commonly known as: NORVASC  Take 1 tablet (2.5 mg total) by mouth daily.   ampicillin  IVPB Inject 6 g into the vein daily. As a continuous infusion. Indication:  Enterococcal endocarditis First Dose: Yes Last Day of Therapy:  02/05/24 Labs - Once weekly:  CBC/D and BMP, Labs - Once weekly: ESR and CRP Method of administration: Ambulatory Pump (Continuous Infusion) Method of administration may be changed at the discretion of home infusion pharmacist based upon assessment of the patient and/or caregiver's ability to self-administer the medication ordered.   carvedilol  6.25 MG tablet Commonly known as: COREG  Take 6.25 mg by mouth 2 (two) times daily with a meal.   cefTRIAXone  IVPB Commonly known as: ROCEPHIN  Inject 2 g into the vein every 12 (twelve) hours. Indication:  Enterococcal  endocarditis First Dose: Yes Last Day of Therapy:  02/05/24 Labs - Once weekly:  CBC/D and BMP, Labs - Once weekly: ESR and CRP Method of administration: IV Push Method of administration may be changed at the discretion of home infusion pharmacist based upon assessment of the patient and/or caregiver's ability to self-administer the medication ordered.   cetirizine 10 MG tablet Commonly known as: ZYRTEC Take 10 mg by mouth at bedtime.   cinacalcet  30 MG tablet Commonly known as: SENSIPAR  Take 30 mg by mouth daily.   FeroSul 325 (65 Fe) MG tablet Generic drug: ferrous sulfate  Take 325 mg by mouth daily.   furosemide  20 MG tablet Commonly known as: LASIX  Take 1 tablet (20 mg total) by mouth daily as needed for edema. Shortness of breath and weight gain   gabapentin  100 MG capsule Commonly known as: NEURONTIN  Take 100-200 mg by mouth at bedtime.   GNP Vitamin D3 Extra Strength 25 MCG (1000 UT) tablet Generic drug: Cholecalciferol Take 1,000 Units by mouth daily.   hydrALAZINE  100 MG tablet Commonly known as: APRESOLINE  Take 100 mg by mouth 3 (three) times daily. What changed: Another medication with the same name was removed. Continue taking this medication, and follow the directions you see here.   isosorbide  mononitrate 30 MG 24 hr tablet Commonly known as: IMDUR  Take 1 tablet (30 mg total) by mouth daily.   ondansetron  4 MG tablet Commonly known as: ZOFRAN  Take 1 tablet (4 mg total) by mouth every 6 (six) hours. As needed for nausea vomiting   pantoprazole  20 MG tablet Commonly known as: PROTONIX  Take 20 mg by mouth daily.   Stiolto Respimat  2.5-2.5 MCG/ACT Aers Generic drug: Tiotropium Bromide-Olodaterol Inhale 2 puffs into the lungs daily.               Discharge Care Instructions  (From admission, onward)           Start     Ordered   12/27/23 0000  Change dressing on IV access line weekly and PRN  (Home infusion instructions - [facility] )        12/27/23 1344            Contact information for after-discharge care     Home Medical Care     [facility] [facility]) .   Service: Home Health Services Contact information: [phone]  [place]  [address] [phone]                    Allergies  Allergen Reactions   Nitrofurantoin Nausea And Vomiting   Sulfa Antibiotics Swelling    Lip swelling  Consultations:    Procedures/Studies: US  EKG SITE RITE Result Date: 12/27/2023 If Site Rite image not attached, placement could not be confirmed due to current cardiac rhythm.  ECHOCARDIOGRAM COMPLETE Result Date: 12/26/2023    ECHOCARDIOGRAM REPORT   Patient Name:   Stephanie [name] Date of Exam: 12/26/2023 Medical Rec #:  [id number]         Height:       65.0 in Accession #:    [id number]        Weight:       196.2 lb Date of Birth:  1935-11-27         BSA:          1.962 m Patient Age:    88 years          BP:           145/62 mmHg Patient Gender: F                 HR:           89 bpm. Exam Location:  [facility] Procedure: 2D Echo, Cardiac Doppler and Color Doppler (Both Spectral and Color            Flow Doppler were utilized during procedure). Indications:    Bacteremia R78.81                 Post TAVR evaluation Z95.2  History:        Patient has prior history of Echocardiogram examinations, most                 recent 10/31/2023. Arrythmias:Atrial Fibrillation; Risk                 Factors:Hypertension, Diabetes and Dyslipidemia. S/P TAVR                 (transcatheter aortic valve replacement). CKD stage 3b, GFR                 30-44 ml/min (HCC).  Sonographer:    Aida Pizza RCS Referring Phys: [phone] BERNARDINO KATHEE COME IMPRESSIONS  1. Left ventricular ejection fraction, by estimation, is 60 to 65%. The left ventricle has normal function. The left ventricle has no regional wall motion abnormalities. There is severe left ventricular hypertrophy. Left ventricular diastolic  parameters  are consistent with Grade I diastolic dysfunction (impaired relaxation).  2. Right ventricular systolic function is normal. The right ventricular size is normal.  3. Left atrial size was moderately dilated.  4. The mitral valve is normal in structure. No evidence of mitral valve regurgitation. No evidence of mitral stenosis. Moderate mitral annular calcification.  5. Edwards Sapien 3 Ultra THV size 26 mm is in the AV position. . The aortic valve has been repaired/replaced. Aortic valve regurgitation is not visualized. No aortic stenosis is present.  6. The inferior vena cava is normal in size with greater than 50% respiratory variability, suggesting right atrial pressure of 3 mmHg. FINDINGS  Left Ventricle: Left ventricular ejection fraction, by estimation, is 60 to 65%. The left ventricle has normal function. The left ventricle has no regional wall motion abnormalities. The left ventricular internal cavity size was normal in size. There is  severe left ventricular hypertrophy. Left ventricular diastolic parameters are consistent with Grade I diastolic dysfunction (impaired relaxation). Right Ventricle: The right ventricular size is normal. Right vetricular wall thickness was not well visualized. Right ventricular systolic function is normal. Left Atrium: Left atrial size was moderately dilated. Right  Atrium: Right atrial size was normal in size. Pericardium: There is no evidence of pericardial effusion. Mitral Valve: The mitral valve is normal in structure. Moderate mitral annular calcification. No evidence of mitral valve regurgitation. No evidence of mitral valve stenosis. Tricuspid Valve: The tricuspid valve is normal in structure. Tricuspid valve regurgitation is not demonstrated. No evidence of tricuspid stenosis. Aortic Valve: Edwards Sapien 3 Ultra THV size 26 mm is in the AV position. The aortic valve has been repaired/replaced. Aortic valve regurgitation is not visualized. No aortic stenosis is  present. Aortic valve mean gradient measures 9.0 mmHg. Aortic valve peak gradient measures 17.1 mmHg. Pulmonic Valve: The pulmonic valve was not well visualized. Pulmonic valve regurgitation is not visualized. No evidence of pulmonic stenosis. Aorta: The aortic root is normal in size and structure and the ascending aorta was not well visualized. Venous: The inferior vena cava is normal in size with greater than 50% respiratory variability, suggesting right atrial pressure of 3 mmHg. IAS/Shunts: No atrial level shunt detected by color flow Doppler.  LEFT VENTRICLE PLAX 2D LVIDd:         4.40 cm LVIDs:         2.00 cm LV PW:         1.60 cm LV IVS:        1.80 cm  RIGHT VENTRICLE TAPSE (M-mode): 1.9 cm LEFT ATRIUM              Index        RIGHT ATRIUM           Index LA diam:        4.80 cm  2.45 cm/m   RA Area:     21.60 cm LA Vol (A2C):   108.0 ml 55.04 ml/m  RA Volume:   63.50 ml  32.36 ml/m LA Vol (A4C):   68.6 ml  34.96 ml/m LA Biplane Vol: 86.3 ml  43.98 ml/m  AORTIC VALVE AV Vmax:           207.00 cm/s AV Vmean:          137.200 cm/s AV VTI:            0.385 m AV Peak Grad:      17.1 mmHg AV Mean Grad:      9.0 mmHg LVOT Vmax:         101.20 cm/s LVOT Vmean:        80.750 cm/s LVOT VTI:          0.209 m LVOT/AV VTI ratio: 0.54  AORTA Ao Root diam: 3.00 cm MITRAL VALVE MV Area (PHT): 4.68 cm     SHUNTS MV Decel Time: 162 msec     Systemic VTI: 0.21 m MV E velocity: 138.00 cm/s MV A velocity: 160.00 cm/s MV E/A ratio:  0.86 Dorn Ross MD Electronically signed by Dorn Ross MD Signature Date/Time: 12/26/2023/10:49:18 AM    Final    DG Chest Portable 1 View Result Date: 12/24/2023 CLINICAL DATA:  Fever, nausea, vomiting, and weakness. EXAM: PORTABLE CHEST 1 VIEW COMPARISON:  10/31/2023, 09/24/2023. FINDINGS: The heart is enlarged and the mediastinal contour stable. There is atherosclerotic calcification of the aorta. A TAVR stent is noted. No consolidation, effusion, or pneumothorax is seen. A  stable vague opacity is seen at the right lung base with associated fiducial marker. No acute osseous abnormality. IMPRESSION: Stable chest with no acute cardiopulmonary process Electronically Signed   By: Leita Birmingham M.D.   On: 12/24/2023 15:49  CT L-SPINE NO CHARGE Result Date: 12/21/2023 EXAM: CT OF THE LUMBAR SPINE WITHOUT CONTRAST 12/21/2023 11:56:47 AM TECHNIQUE: CT of the lumbar spine was performed without the administration of intravenous contrast. Multiplanar reformatted images are provided for review. Automated exposure control, iterative reconstruction, and/or weight based adjustment of the mA/kV was utilized to reduce the radiation dose to as low as reasonably achievable. COMPARISON: Lumbar MRI 08/13/2013. CT abdomen and pelvis 10/18/2023. CT abdomen and pelvis today reported separately. CLINICAL HISTORY: 88 year old female. Lumbar pain. FINDINGS: BONES AND ALIGNMENT: Normal lumbar segmentation. Lumbar lordosis has not significantly changed since 2015. Mild levoconvex lumbar scoliosis, apex at L1-L2. Background osteopenia. Visible sacroiliac joints appear intact. Lumbar vertebral height is stable since September. No acute osseous abnormality. SOFT TISSUES: Abdomen and pelvic viscera are reported separately today. Postoperative changes to the lumbar paraspinal soft tissues with no adverse features identified. DEGENERATIVE CHANGES: L1-L2: Chronic severe disc space loss and vacuum disc. Bulky and sclerotic circumferential endplate osteophytosis. Comparatively mild posterior element hypertrophy. Evidence of epidural lipomatosis. This level appears very similar to the September CT. Multifactorial spinal stenosis is suspected to be moderate (series 16 image 32). Moderate to severe left L1 neural foraminal stenosis. L2-L3: Chronic posterior fusion. No hardware loosening. Evidence of solid posterior element arthrodesis on the right. L3-L4: Chronic posterior decompression and fusion. No hardware loosening.  Solid posterior element arthrodesis. L4-L5: Chronic posterior decompression and fusion. No hardware loosening. Solid posterior element arthrodesis. L5-S1: Chronic posterior decompression and fusion. Lucency along the S1 pedicle screws suggests loosening on series 12 image 43. There is interbody gas phenomenon, and no convincing arthrodesis at this level. IMPRESSION: 1. Previous lumbar fusion / decompression L2 through the sacrum. 2. 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 3. Evidence of postoperative pseudoarthrosis at L5-S1, with loosening of the S1 pedicle screws. 4. Solid arthrodesis L2 through L4. 5. CT abdomen and pelvis today reported separately. Electronically signed by: Helayne Hurst MD 12/21/2023 12:43 PM EST RP Workstation: [id number]   CT ABDOMEN PELVIS W CONTRAST Result Date: 12/21/2023 EXAM: CT ABDOMEN AND PELVIS WITH CONTRAST 12/21/2023 11:56:47 AM TECHNIQUE: CT of the abdomen and pelvis was performed with the administration of 80 mL of iohexol  (OMNIPAQUE ) 300 MG/ML solution. Multiplanar reformatted images are provided for review. Automated exposure control, iterative reconstruction, and/or weight-based adjustment of the mA/kV was utilized to reduce the radiation dose to as low as reasonably achievable. COMPARISON: 10/18/2023 CLINICAL HISTORY: Abdominal pain, acute, nonlocalized; fall 2 weeks ago, left hip pain. FINDINGS: LOWER CHEST: No acute abnormality. LIVER: Stable hepatic cysts. GALLBLADDER AND BILE DUCTS: Gallbladder is unremarkable. No biliary ductal dilatation. SPLEEN: No acute abnormality. PANCREAS: No acute abnormality. ADRENAL GLANDS: Stable left adrenal adenoma. KIDNEYS, URETERS AND BLADDER: Stable right renal cysts. No stones in the kidneys or ureters. No hydronephrosis. No perinephric or periureteral stranding. Urinary bladder is unremarkable. Per  consensus, no follow-up is needed for simple Bosniak type 1 and 2 renal cysts, unless the patient has a malignancy history or risk factors. GI AND BOWEL: Sigmoid diverticulosis without inflammation. Status post appendectomy. Stomach demonstrates no acute abnormality. There is no bowel obstruction. PERITONEUM AND RETROPERITONEUM: No ascites. No free air. VASCULATURE: Aortic atherosclerosis. Aorta is normal in caliber. LYMPH NODES: No lymphadenopathy. REPRODUCTIVE ORGANS: No acute abnormality. BONES AND SOFT TISSUES: Status post surgical posterior fusion extending from L2 to S1. No acute osseous abnormality. No focal soft tissue abnormality.  IMPRESSION: 1. No acute findings in the abdomen or pelvis. 2. Stable hepatic cysts, benign with no follow-up recommended. 3. Stable left adrenal adenoma, benign with no follow-up recommended. 4. Stable right renal simple cysts, benign with no follow-up recommended. 5. Aortic atherosclerosis. Electronically signed by: Lynwood Seip MD 12/21/2023 12:36 PM EST RP Workstation: [id number]   DG Hip Unilat With Pelvis 2-3 Views Left Result Date: 12/21/2023 CLINICAL DATA:  Left hip pain. EXAM: DG HIP (WITH OR WITHOUT PELVIS) 2-3V LEFT COMPARISON:  None Available. FINDINGS: There is no evidence of hip fracture or dislocation. Mild osteoarthritis of both hips. No bony lesions or destruction. Intact bony pelvis. Prior lower lumbar fusion. IMPRESSION: Mild osteoarthritis of both hips. Electronically Signed   By: Marcey Moan M.D.   On: 12/21/2023 10:21      Subjective: Patient sitting on edge of the bed.  Not in any distress.  Denies any complaints.  She says she ambulated today without issues.  Febrile, stable vital signs.  Discharge Exam: Vitals:   12/27/23 2155 12/28/23 0428  BP: 137/77 (!) 149/68  Pulse: 68 68  Resp: 18 18  Temp: 97.9 F (36.6 C) 98.1 F (36.7 C)  SpO2: 94% 95%   Objective: Gen: Pleasant obese female in no distress Pulm: No wheezes, nonlabored on  room air  CV: RRR, no RG, no pitting edema GI: Soft, NT, ND, +BS Neuro: Alert and oriented. No new focal deficits. Ext: Warm, no deformities. PIV LUE Skin: No new rashes, lesions or ulcers on visualized skin    The results of significant diagnostics from this hospitalization (including imaging, microbiology, ancillary and laboratory) are listed below for reference.     Microbiology: Recent Results (from the past 240 hours)  Resp panel by RT-PCR (RSV, Flu A&B, Covid) Anterior Nasal Swab     Status: None   Collection Time: 12/24/23  2:27 PM   Specimen: Anterior Nasal Swab  Result Value Ref Range Status   SARS Coronavirus 2 by RT PCR NEGATIVE NEGATIVE Final    Comment: (NOTE) SARS-CoV-2 target nucleic acids are NOT DETECTED.  The SARS-CoV-2 RNA is generally detectable in upper respiratory specimens during the acute phase of infection. The lowest concentration of SARS-CoV-2 viral copies this assay can detect is 138 copies/mL. A negative result does not preclude SARS-Cov-2 infection and should not be used as the sole basis for treatment or other patient management decisions. A negative result may occur with  improper specimen collection/handling, submission of specimen other than nasopharyngeal swab, presence of viral mutation(s) within the areas targeted by this assay, and inadequate number of viral copies(<138 copies/mL). A negative result must be combined with clinical observations, patient history, and epidemiological information. The expected result is Negative.  Fact Sheet for Patients:  bloggercourse.com  Fact Sheet for Healthcare Providers:  seriousbroker.it  This test is no t yet approved or cleared by the [place]  FDA and  has been authorized for detection and/or diagnosis of SARS-CoV-2 by FDA under an Emergency Use Authorization (EUA). This EUA will remain  in effect (meaning this test can be used) for the duration  of the COVID-19 declaration under Section 564(b)(1) of the Act, 21 U.S.C.section 360bbb-3(b)(1), unless the authorization is terminated  or revoked sooner.       Influenza A by PCR NEGATIVE NEGATIVE Final   Influenza B by PCR NEGATIVE NEGATIVE Final    Comment: (NOTE) The Xpert Xpress SARS-CoV-2/FLU/RSV plus assay is intended as an aid in the diagnosis of influenza from Nasopharyngeal swab  specimens and should not be used as a sole basis for treatment. Nasal washings and aspirates are unacceptable for Xpert Xpress SARS-CoV-2/FLU/RSV testing.  Fact Sheet for Patients: bloggercourse.com  Fact Sheet for Healthcare Providers: seriousbroker.it  This test is not yet approved or cleared by the [place]  FDA and has been authorized for detection and/or diagnosis of SARS-CoV-2 by FDA under an Emergency Use Authorization (EUA). This EUA will remain in effect (meaning this test can be used) for the duration of the COVID-19 declaration under Section 564(b)(1) of the Act, 21 U.S.C. section 360bbb-3(b)(1), unless the authorization is terminated or revoked.     Resp Syncytial Virus by PCR NEGATIVE NEGATIVE Final    Comment: (NOTE) Fact Sheet for Patients: bloggercourse.com  Fact Sheet for Healthcare Providers: seriousbroker.it  This test is not yet approved or cleared by the [place]  FDA and has been authorized for detection and/or diagnosis of SARS-CoV-2 by FDA under an Emergency Use Authorization (EUA). This EUA will remain in effect (meaning this test can be used) for the duration of the COVID-19 declaration under Section 564(b)(1) of the Act, 21 U.S.C. section 360bbb-3(b)(1), unless the authorization is terminated or revoked.  Performed at [facility], [address] [place], [place] [address]   Culture, blood (routine x 2)     Status: Abnormal   Collection Time: 12/24/23   3:42 PM   Specimen: BLOOD LEFT ARM  Result Value Ref Range Status   Specimen Description   Final    BLOOD LEFT ARM BOTTLES DRAWN AEROBIC AND ANAEROBIC Performed at [facility], [address], [place], [place] [address]    Special Requests   Final    Blood Culture results may not be optimal due to an inadequate volume of blood received in culture bottles Performed at [facility], [address] [place], [place] [address]    Culture  Setup Time   Final    GRAM POSITIVE COCCI IN BOTH AEROBIC AND ANAEROBIC BOTTLES CRITICAL RESULT CALLED TO, READ BACK BY AND VERIFIED WITH: ANABEL,RN ON 12/25/23 AT 0430 BY PURDIE,J CRITICAL VALUE NOTED.  VALUE IS CONSISTENT WITH PREVIOUSLY REPORTED AND CALLED VALUE. Performed at [facility], [address], [place], [place] [address]    Culture (A)  Final    ENTEROCOCCUS FAECALIS SUSCEPTIBILITIES PERFORMED ON PREVIOUS CULTURE WITHIN THE LAST 5 DAYS. Performed at [facility], [address], [place], [place] [address]    Report Status 12/27/2023 FINAL  Final  Culture, blood (routine x 2)     Status: Abnormal   Collection Time: 12/24/23  4:09 PM   Specimen: Left Antecubital; Blood  Result Value Ref Range Status   Specimen Description   Final    LEFT ANTECUBITAL BOTTLES DRAWN AEROBIC AND ANAEROBIC Performed at [facility], [address] [place], [place] [address]    Special Requests   Final    Blood Culture results may not be optimal due to an inadequate volume of blood received in culture bottles Performed at [facility], [address] [place], [place] [address]    Culture  Setup Time   Final    GRAM POSITIVE COCCI IN BOTH AEROBIC AND ANAEROBIC BOTTLES CRITICAL RESULT CALLED TO, READ BACK BY AND VERIFIED WITH: ANABEL,RN ON 12/25/23 AT 0430 BY PURDIE,J CRITICAL RESULT CALLED TO, READ BACK BY AND VERIFIED WITH: RN ARMOND [id number] AT 1020 BY EC Performed at [facility], [address] [place], [place] [address]    Culture  ENTEROCOCCUS FAECALIS (A)  Final   Report Status 12/27/2023 FINAL  Final   Organism ID, Bacteria ENTEROCOCCUS FAECALIS  Final      Susceptibility   Enterococcus faecalis - MIC*    AMPICILLIN  <=2 SENSITIVE Sensitive     VANCOMYCIN  1 SENSITIVE Sensitive     GENTAMICIN SYNERGY SENSITIVE Sensitive     * ENTEROCOCCUS FAECALIS  Blood Culture ID Panel (Reflexed)     Status: Abnormal   Collection Time: 12/24/23  4:09 PM  Result Value Ref Range Status   Enterococcus faecalis DETECTED (A) NOT DETECTED Final    Comment: CRITICAL RESULT CALLED TO, READ BACK BY AND VERIFIED WITH: RN ARMOND [id number] AT 1020 BY EC    Enterococcus Faecium NOT DETECTED NOT DETECTED Final   Listeria monocytogenes NOT DETECTED NOT DETECTED Final   Staphylococcus species NOT DETECTED NOT DETECTED Final   Staphylococcus aureus (BCID) NOT DETECTED NOT DETECTED Final   Staphylococcus epidermidis NOT DETECTED NOT DETECTED Final   Staphylococcus lugdunensis NOT DETECTED NOT DETECTED Final   Streptococcus species NOT DETECTED NOT DETECTED Final   Streptococcus agalactiae NOT DETECTED NOT DETECTED Final   Streptococcus pneumoniae NOT DETECTED NOT DETECTED Final   Streptococcus pyogenes NOT DETECTED NOT DETECTED Final   A.calcoaceticus-baumannii NOT DETECTED NOT DETECTED Final   Bacteroides fragilis NOT DETECTED NOT DETECTED Final   Enterobacterales NOT DETECTED NOT DETECTED Final   Enterobacter cloacae complex NOT DETECTED NOT DETECTED Final   Escherichia coli NOT DETECTED NOT DETECTED Final   Klebsiella aerogenes NOT DETECTED NOT DETECTED Final   Klebsiella oxytoca NOT DETECTED NOT DETECTED Final   Klebsiella pneumoniae NOT DETECTED NOT DETECTED Final   Proteus species NOT DETECTED NOT DETECTED Final   Salmonella species NOT DETECTED NOT DETECTED Final   Serratia marcescens NOT DETECTED NOT DETECTED Final   Haemophilus influenzae NOT DETECTED NOT DETECTED Final   Neisseria meningitidis NOT DETECTED NOT DETECTED Final    Pseudomonas aeruginosa NOT DETECTED NOT DETECTED Final   Stenotrophomonas maltophilia NOT DETECTED NOT DETECTED Final   Candida albicans NOT DETECTED NOT DETECTED Final   Candida auris NOT DETECTED NOT DETECTED Final   Candida glabrata NOT DETECTED NOT DETECTED Final   Candida krusei NOT DETECTED NOT DETECTED Final   Candida parapsilosis NOT DETECTED NOT DETECTED Final   Candida tropicalis NOT DETECTED NOT DETECTED Final   Cryptococcus neoformans/gattii NOT DETECTED NOT DETECTED Final   Vancomycin  resistance NOT DETECTED NOT DETECTED Final    Comment: Performed at [facility], [address], [place], [place] [address]  Urine Culture     Status: Abnormal   Collection Time: 12/24/23  4:25 PM   Specimen: Urine, Clean Catch  Result Value Ref Range Status   Specimen Description   Final    URINE, CLEAN CATCH Performed at [facility], [address] [place], [place] [address]    Special Requests   Final    NONE Performed at [facility], [address] [place], [place] [address]    Culture >=100,000 COLONIES/mL ESCHERICHIA COLI (A)  Final   Report Status 12/26/2023 FINAL  Final   Organism ID, Bacteria ESCHERICHIA COLI (A)  Final      Susceptibility   Escherichia coli - MIC*    AMPICILLIN  >=32 RESISTANT Resistant     CEFAZOLIN  (URINE) Value in next row Sensitive      2 SENSITIVEThis is a modified FDA-approved test that has been validated and its performance characteristics determined by the reporting laboratory.  This laboratory is certified under the Clinical  Laboratory Improvement Amendments CLIA as qualified to perform high complexity clinical laboratory testing.    CEFEPIME Value in next row Sensitive      2 SENSITIVEThis is a modified FDA-approved test that has been validated and its performance characteristics determined by the reporting laboratory.  This laboratory is certified under the Clinical Laboratory Improvement Amendments CLIA as qualified to perform high complexity  clinical laboratory testing.    ERTAPENEM Value in next row Sensitive      2 SENSITIVEThis is a modified FDA-approved test that has been validated and its performance characteristics determined by the reporting laboratory.  This laboratory is certified under the Clinical Laboratory Improvement Amendments CLIA as qualified to perform high complexity clinical laboratory testing.    CEFTRIAXONE  Value in next row Sensitive      2 SENSITIVEThis is a modified FDA-approved test that has been validated and its performance characteristics determined by the reporting laboratory.  This laboratory is certified under the Clinical Laboratory Improvement Amendments CLIA as qualified to perform high complexity clinical laboratory testing.    CIPROFLOXACIN  Value in next row Intermediate      2 SENSITIVEThis is a modified FDA-approved test that has been validated and its performance characteristics determined by the reporting laboratory.  This laboratory is certified under the Clinical Laboratory Improvement Amendments CLIA as qualified to perform high complexity clinical laboratory testing.    GENTAMICIN Value in next row Sensitive      2 SENSITIVEThis is a modified FDA-approved test that has been validated and its performance characteristics determined by the reporting laboratory.  This laboratory is certified under the Clinical Laboratory Improvement Amendments CLIA as qualified to perform high complexity clinical laboratory testing.    NITROFURANTOIN Value in next row Sensitive      2 SENSITIVEThis is a modified FDA-approved test that has been validated and its performance characteristics determined by the reporting laboratory.  This laboratory is certified under the Clinical Laboratory Improvement Amendments CLIA as qualified to perform high complexity clinical laboratory testing.    TRIMETH/SULFA Value in next row Resistant      2 SENSITIVEThis is a modified FDA-approved test that has been validated and its  performance characteristics determined by the reporting laboratory.  This laboratory is certified under the Clinical Laboratory Improvement Amendments CLIA as qualified to perform high complexity clinical laboratory testing.    AMPICILLIN /SULBACTAM Value in next row Sensitive      2 SENSITIVEThis is a modified FDA-approved test that has been validated and its performance characteristics determined by the reporting laboratory.  This laboratory is certified under the Clinical Laboratory Improvement Amendments CLIA as qualified to perform high complexity clinical laboratory testing.    PIP/TAZO Value in next row Sensitive      <=4 SENSITIVEThis is a modified FDA-approved test that has been validated and its performance characteristics determined by the reporting laboratory.  This laboratory is certified under the Clinical Laboratory Improvement Amendments CLIA as qualified to perform high complexity clinical laboratory testing.    MEROPENEM  Value in next row Sensitive      <=4 SENSITIVEThis is a modified FDA-approved test that has been validated and its performance characteristics determined by the reporting laboratory.  This laboratory is certified under the Clinical Laboratory Improvement Amendments CLIA as qualified to perform high complexity clinical laboratory testing.    * >=100,000 COLONIES/mL ESCHERICHIA COLI  Culture, blood (Routine X 2) w Reflex to ID Panel     Status: None (Preliminary result)   Collection Time: 12/25/23 10:51 AM  Specimen: BLOOD  Result Value Ref Range Status   Specimen Description BLOOD LEFT ANTECUBITAL  Final   Special Requests   Final    BOTTLES DRAWN AEROBIC AND ANAEROBIC Blood Culture adequate volume   Culture   Final    NO GROWTH 3 DAYS Performed at [facility], [address], [place], [place] [address]    Report Status PENDING  Incomplete  Culture, blood (Routine X 2) w Reflex to ID Panel     Status: None (Preliminary result)   Collection Time: 12/25/23 10:51  AM   Specimen: BLOOD  Result Value Ref Range Status   Specimen Description BLOOD BLOOD LEFT HAND  Final   Special Requests   Final    BOTTLES DRAWN AEROBIC AND ANAEROBIC Blood Culture adequate volume   Culture   Final    NO GROWTH 3 DAYS Performed at [facility], [address] [place], [place] [address]    Report Status PENDING  Incomplete  Culture, blood (Routine X 2) w Reflex to ID Panel     Status: None (Preliminary result)   Collection Time: 12/26/23  3:05 PM   Specimen: BLOOD  Result Value Ref Range Status   Specimen Description BLOOD BLOOD LEFT ARM AEROBIC BOTTLE ONLY  Final   Special Requests Blood Culture adequate volume  Final   Culture   Final    NO GROWTH 2 DAYS Performed at [facility], [address] [place], [place] [address]    Report Status PENDING  Incomplete  Culture, blood (Routine X 2) w Reflex to ID Panel     Status: None (Preliminary result)   Collection Time: 12/26/23  3:05 PM   Specimen: BLOOD  Result Value Ref Range Status   Specimen Description BLOOD AEROBIC BOTTLE ONLY LFOA  Final   Special Requests Blood Culture adequate volume  Final   Culture   Final    NO GROWTH 2 DAYS Performed at [facility], [address] [place], [place] [address]    Report Status PENDING  Incomplete     Labs: BNP (last 3 results) Recent Labs    08/12/23 0907 09/03/23 1046 09/24/23 0715  BNP 363.0* 581.0* 724.0*   Basic Metabolic Panel: Recent Labs  Lab 12/24/23 1420 12/25/23 0512 12/26/23 0510 12/28/23 0502  NA 131* 133* 133* 136  K 4.4 4.1 3.8 4.1  CL 96* 98 98 100  CO2 22 28 21* 27  GLUCOSE 162* 116* 95 109*  BUN 16 16 21 15   CREATININE 1.49* 1.65* 1.69* 1.42*  CALCIUM  8.9 8.9 8.9 9.0  PHOS  --  2.7  --   --    Liver Function Tests: Recent Labs  Lab 12/24/23 1420 12/25/23 0512  AST 21  --   ALT 7  --   ALKPHOS 116  --   BILITOT 1.2  --   PROT 6.5  --   ALBUMIN 3.7 3.4*   No results for input(s): LIPASE, AMYLASE in the last 168  hours. No results for input(s): AMMONIA in the last 168 hours. CBC: Recent Labs  Lab 12/24/23 1420 12/25/23 0512 12/26/23 0710 12/27/23 0417  WBC 16.7* 17.3* 13.1* 11.3*  HGB 12.4 11.8* 11.5* 11.1*  HCT 37.9 36.0 35.0* 34.1*  MCV 84.6 85.1 85.8 85.7  PLT 175 153 125* 130*   Cardiac Enzymes: Recent Labs  Lab 12/26/23 0510  CKTOTAL 45   BNP: Invalid input(s): POCBNP CBG: Recent Labs  Lab 12/27/23 1125 12/27/23 1639 12/27/23 2155 12/28/23 0747 12/28/23 1109  GLUCAP  111* 111* 112* 120* 149*   D-Dimer No results for input(s): DDIMER in the last 72 hours. Hgb A1c No results for input(s): HGBA1C in the last 72 hours. Lipid Profile No results for input(s): CHOL, HDL, LDLCALC, TRIG, CHOLHDL, LDLDIRECT in the last 72 hours. Thyroid  function studies No results for input(s): TSH, T4TOTAL, T3FREE, THYROIDAB in the last 72 hours.  Invalid input(s): FREET3 Anemia work up No results for input(s): VITAMINB12, FOLATE, FERRITIN, TIBC, IRON, RETICCTPCT in the last 72 hours. Urinalysis    Component Value Date/Time   COLORURINE YELLOW 12/24/2023 1625   APPEARANCEUR HAZY (A) 12/24/2023 1625   APPEARANCEUR Hazy (A) 05/23/2020 1119   LABSPEC 1.019 12/24/2023 1625   PHURINE 6.0 12/24/2023 1625   GLUCOSEU NEGATIVE 12/24/2023 1625   HGBUR NEGATIVE 12/24/2023 1625   BILIRUBINUR NEGATIVE 12/24/2023 1625   BILIRUBINUR Negative 05/23/2020 1119   KETONESUR NEGATIVE 12/24/2023 1625   PROTEINUR >=300 (A) 12/24/2023 1625   UROBILINOGEN 0.2 04/25/2014 0936   NITRITE NEGATIVE 12/24/2023 1625   LEUKOCYTESUR TRACE (A) 12/24/2023 1625   Sepsis Labs Recent Labs  Lab 12/24/23 1420 12/25/23 0512 12/26/23 0710 12/27/23 0417  WBC 16.7* 17.3* 13.1* 11.3*   Microbiology Recent Results (from the past 240 hours)  Resp panel by RT-PCR (RSV, Flu A&B, Covid) Anterior Nasal Swab     Status: None   Collection Time: 12/24/23  2:27 PM   Specimen: Anterior  Nasal Swab  Result Value Ref Range Status   SARS Coronavirus 2 by RT PCR NEGATIVE NEGATIVE Final    Comment: (NOTE) SARS-CoV-2 target nucleic acids are NOT DETECTED.  The SARS-CoV-2 RNA is generally detectable in upper respiratory specimens during the acute phase of infection. The lowest concentration of SARS-CoV-2 viral copies this assay can detect is 138 copies/mL. A negative result does not preclude SARS-Cov-2 infection and should not be used as the sole basis for treatment or other patient management decisions. A negative result may occur with  improper specimen collection/handling, submission of specimen other than nasopharyngeal swab, presence of viral mutation(s) within the areas targeted by this assay, and inadequate number of viral copies(<138 copies/mL). A negative result must be combined with clinical observations, patient history, and epidemiological information. The expected result is Negative.  Fact Sheet for Patients:  bloggercourse.com  Fact Sheet for Healthcare Providers:  seriousbroker.it  This test is no t yet approved or cleared by the [place]  FDA and  has been authorized for detection and/or diagnosis of SARS-CoV-2 by FDA under an Emergency Use Authorization (EUA). This EUA will remain  in effect (meaning this test can be used) for the duration of the COVID-19 declaration under Section 564(b)(1) of the Act, 21 U.S.C.section 360bbb-3(b)(1), unless the authorization is terminated  or revoked sooner.       Influenza A by PCR NEGATIVE NEGATIVE Final   Influenza B by PCR NEGATIVE NEGATIVE Final    Comment: (NOTE) The Xpert Xpress SARS-CoV-2/FLU/RSV plus assay is intended as an aid in the diagnosis of influenza from Nasopharyngeal swab specimens and should not be used as a sole basis for treatment. Nasal washings and aspirates are unacceptable for Xpert Xpress SARS-CoV-2/FLU/RSV testing.  Fact Sheet for  Patients: bloggercourse.com  Fact Sheet for Healthcare Providers: seriousbroker.it  This test is not yet approved or cleared by the [place]  FDA and has been authorized for detection and/or diagnosis of SARS-CoV-2 by FDA under an Emergency Use Authorization (EUA). This EUA will remain in effect (meaning this test can be used) for the duration  of the COVID-19 declaration under Section 564(b)(1) of the Act, 21 U.S.C. section 360bbb-3(b)(1), unless the authorization is terminated or revoked.     Resp Syncytial Virus by PCR NEGATIVE NEGATIVE Final    Comment: (NOTE) Fact Sheet for Patients: bloggercourse.com  Fact Sheet for Healthcare Providers: seriousbroker.it  This test is not yet approved or cleared by the [place]  FDA and has been authorized for detection and/or diagnosis of SARS-CoV-2 by FDA under an Emergency Use Authorization (EUA). This EUA will remain in effect (meaning this test can be used) for the duration of the COVID-19 declaration under Section 564(b)(1) of the Act, 21 U.S.C. section 360bbb-3(b)(1), unless the authorization is terminated or revoked.  Performed at [facility], [address] [place], [place] [address]   Culture, blood (routine x 2)     Status: Abnormal   Collection Time: 12/24/23  3:42 PM   Specimen: BLOOD LEFT ARM  Result Value Ref Range Status   Specimen Description   Final    BLOOD LEFT ARM BOTTLES DRAWN AEROBIC AND ANAEROBIC Performed at [facility], [address], [place], [place] [address]    Special Requests   Final    Blood Culture results may not be optimal due to an inadequate volume of blood received in culture bottles Performed at [facility], [address], [place], [place] [address]    Culture  Setup Time   Final    GRAM POSITIVE COCCI IN BOTH AEROBIC AND ANAEROBIC BOTTLES CRITICAL RESULT CALLED TO, READ BACK BY AND  VERIFIED WITH: ANABEL,RN ON 12/25/23 AT 0430 BY PURDIE,J CRITICAL VALUE NOTED.  VALUE IS CONSISTENT WITH PREVIOUSLY REPORTED AND CALLED VALUE. Performed at [facility], [address], [place], [place] [address]    Culture (A)  Final    ENTEROCOCCUS FAECALIS SUSCEPTIBILITIES PERFORMED ON PREVIOUS CULTURE WITHIN THE LAST 5 DAYS. Performed at [facility], [address], [place], [place] [address]    Report Status 12/27/2023 FINAL  Final  Culture, blood (routine x 2)     Status: Abnormal   Collection Time: 12/24/23  4:09 PM   Specimen: Left Antecubital; Blood  Result Value Ref Range Status   Specimen Description   Final    LEFT ANTECUBITAL BOTTLES DRAWN AEROBIC AND ANAEROBIC Performed at [facility], [address], [place], [place] [address]    Special Requests   Final    Blood Culture results may not be optimal due to an inadequate volume of blood received in culture bottles Performed at [facility], [address] [place], [place] [address]    Culture  Setup Time   Final    GRAM POSITIVE COCCI IN BOTH AEROBIC AND ANAEROBIC BOTTLES CRITICAL RESULT CALLED TO, READ BACK BY AND VERIFIED WITH: ANABEL,RN ON 12/25/23 AT 0430 BY PURDIE,J CRITICAL RESULT CALLED TO, READ BACK BY AND VERIFIED WITH: RN ARMOND [phone] AT 1020 BY EC Performed at [facility], [address] [place], [place] [address]    Culture ENTEROCOCCUS FAECALIS (A)  Final   Report Status 12/27/2023 FINAL  Final   Organism ID, Bacteria ENTEROCOCCUS FAECALIS  Final      Susceptibility   Enterococcus faecalis - MIC*    AMPICILLIN  <=2 SENSITIVE Sensitive     VANCOMYCIN  1 SENSITIVE Sensitive     GENTAMICIN SYNERGY SENSITIVE Sensitive     * ENTEROCOCCUS FAECALIS  Blood Culture ID Panel (Reflexed)     Status: Abnormal   Collection Time: 12/24/23  4:09 PM  Result Value  Ref Range Status   Enterococcus faecalis DETECTED (A) NOT DETECTED Final    Comment: CRITICAL RESULT CALLED TO, READ BACK BY AND VERIFIED  WITH: RN ARMOND [id number] AT 1020 BY EC    Enterococcus Faecium NOT DETECTED NOT DETECTED Final   Listeria monocytogenes NOT DETECTED NOT DETECTED Final   Staphylococcus species NOT DETECTED NOT DETECTED Final   Staphylococcus aureus (BCID) NOT DETECTED NOT DETECTED Final   Staphylococcus epidermidis NOT DETECTED NOT DETECTED Final   Staphylococcus lugdunensis NOT DETECTED NOT DETECTED Final   Streptococcus species NOT DETECTED NOT DETECTED Final   Streptococcus agalactiae NOT DETECTED NOT DETECTED Final   Streptococcus pneumoniae NOT DETECTED NOT DETECTED Final   Streptococcus pyogenes NOT DETECTED NOT DETECTED Final   A.calcoaceticus-baumannii NOT DETECTED NOT DETECTED Final   Bacteroides fragilis NOT DETECTED NOT DETECTED Final   Enterobacterales NOT DETECTED NOT DETECTED Final   Enterobacter cloacae complex NOT DETECTED NOT DETECTED Final   Escherichia coli NOT DETECTED NOT DETECTED Final   Klebsiella aerogenes NOT DETECTED NOT DETECTED Final   Klebsiella oxytoca NOT DETECTED NOT DETECTED Final   Klebsiella pneumoniae NOT DETECTED NOT DETECTED Final   Proteus species NOT DETECTED NOT DETECTED Final   Salmonella species NOT DETECTED NOT DETECTED Final   Serratia marcescens NOT DETECTED NOT DETECTED Final   Haemophilus influenzae NOT DETECTED NOT DETECTED Final   Neisseria meningitidis NOT DETECTED NOT DETECTED Final   Pseudomonas aeruginosa NOT DETECTED NOT DETECTED Final   Stenotrophomonas maltophilia NOT DETECTED NOT DETECTED Final   Candida albicans NOT DETECTED NOT DETECTED Final   Candida auris NOT DETECTED NOT DETECTED Final   Candida glabrata NOT DETECTED NOT DETECTED Final   Candida krusei NOT DETECTED NOT DETECTED Final   Candida parapsilosis NOT DETECTED NOT DETECTED Final   Candida tropicalis NOT DETECTED NOT DETECTED Final   Cryptococcus neoformans/gattii NOT DETECTED NOT DETECTED Final   Vancomycin  resistance NOT DETECTED NOT DETECTED Final    Comment: Performed at  [facility], [address] [place], [place] [address]  Urine Culture     Status: Abnormal   Collection Time: 12/24/23  4:25 PM   Specimen: Urine, Clean Catch  Result Value Ref Range Status   Specimen Description   Final    URINE, CLEAN CATCH Performed at [facility], [address] [place], [place] [address]    Special Requests   Final    NONE Performed at [facility], [address], [place], [place] [address]    Culture >=100,000 COLONIES/mL ESCHERICHIA COLI (A)  Final   Report Status 12/26/2023 FINAL  Final   Organism ID, Bacteria ESCHERICHIA COLI (A)  Final      Susceptibility   Escherichia coli - MIC*    AMPICILLIN  >=32 RESISTANT Resistant     CEFAZOLIN  (URINE) Value in next row Sensitive      2 SENSITIVEThis is a modified FDA-approved test that has been validated and its performance characteristics determined by the reporting laboratory.  This laboratory is certified under the Clinical Laboratory Improvement Amendments CLIA as qualified to perform high complexity clinical laboratory testing.    CEFEPIME Value in next row Sensitive      2 SENSITIVEThis is a modified FDA-approved test that has been validated and its performance characteristics determined by the reporting laboratory.  This laboratory is certified under the Clinical Laboratory Improvement Amendments CLIA as qualified to perform high complexity clinical laboratory testing.    ERTAPENEM Value in next row Sensitive  2 SENSITIVEThis is a modified FDA-approved test that has been validated and its performance characteristics determined by the reporting laboratory.  This laboratory is certified under the Clinical Laboratory Improvement Amendments CLIA as qualified to perform high complexity clinical laboratory testing.    CEFTRIAXONE  Value in next row Sensitive      2 SENSITIVEThis is a modified FDA-approved test that has been validated and its performance characteristics determined by the reporting laboratory.   This laboratory is certified under the Clinical Laboratory Improvement Amendments CLIA as qualified to perform high complexity clinical laboratory testing.    CIPROFLOXACIN  Value in next row Intermediate      2 SENSITIVEThis is a modified FDA-approved test that has been validated and its performance characteristics determined by the reporting laboratory.  This laboratory is certified under the Clinical Laboratory Improvement Amendments CLIA as qualified to perform high complexity clinical laboratory testing.    GENTAMICIN Value in next row Sensitive      2 SENSITIVEThis is a modified FDA-approved test that has been validated and its performance characteristics determined by the reporting laboratory.  This laboratory is certified under the Clinical Laboratory Improvement Amendments CLIA as qualified to perform high complexity clinical laboratory testing.    NITROFURANTOIN Value in next row Sensitive      2 SENSITIVEThis is a modified FDA-approved test that has been validated and its performance characteristics determined by the reporting laboratory.  This laboratory is certified under the Clinical Laboratory Improvement Amendments CLIA as qualified to perform high complexity clinical laboratory testing.    TRIMETH/SULFA Value in next row Resistant      2 SENSITIVEThis is a modified FDA-approved test that has been validated and its performance characteristics determined by the reporting laboratory.  This laboratory is certified under the Clinical Laboratory Improvement Amendments CLIA as qualified to perform high complexity clinical laboratory testing.    AMPICILLIN /SULBACTAM Value in next row Sensitive      2 SENSITIVEThis is a modified FDA-approved test that has been validated and its performance characteristics determined by the reporting laboratory.  This laboratory is certified under the Clinical Laboratory Improvement Amendments CLIA as qualified to perform high complexity clinical laboratory testing.     PIP/TAZO Value in next row Sensitive      <=4 SENSITIVEThis is a modified FDA-approved test that has been validated and its performance characteristics determined by the reporting laboratory.  This laboratory is certified under the Clinical Laboratory Improvement Amendments CLIA as qualified to perform high complexity clinical laboratory testing.    MEROPENEM  Value in next row Sensitive      <=4 SENSITIVEThis is a modified FDA-approved test that has been validated and its performance characteristics determined by the reporting laboratory.  This laboratory is certified under the Clinical Laboratory Improvement Amendments CLIA as qualified to perform high complexity clinical laboratory testing.    * >=100,000 COLONIES/mL ESCHERICHIA COLI  Culture, blood (Routine X 2) w Reflex to ID Panel     Status: None (Preliminary result)   Collection Time: 12/25/23 10:51 AM   Specimen: BLOOD  Result Value Ref Range Status   Specimen Description BLOOD LEFT ANTECUBITAL  Final   Special Requests   Final    BOTTLES DRAWN AEROBIC AND ANAEROBIC Blood Culture adequate volume   Culture   Final    NO GROWTH 3 DAYS Performed at [facility], [address], [place], [place] [address]    Report Status PENDING  Incomplete  Culture, blood (Routine X 2) w Reflex to ID Panel  Status: None (Preliminary result)   Collection Time: 12/25/23 10:51 AM   Specimen: BLOOD  Result Value Ref Range Status   Specimen Description BLOOD BLOOD LEFT HAND  Final   Special Requests   Final    BOTTLES DRAWN AEROBIC AND ANAEROBIC Blood Culture adequate volume   Culture   Final    NO GROWTH 3 DAYS Performed at [facility], [address], [place], [place] [address]    Report Status PENDING  Incomplete  Culture, blood (Routine X 2) w Reflex to ID Panel     Status: None (Preliminary result)   Collection Time: 12/26/23  3:05 PM   Specimen: BLOOD  Result Value Ref Range Status   Specimen Description BLOOD BLOOD LEFT ARM AEROBIC  BOTTLE ONLY  Final   Special Requests Blood Culture adequate volume  Final   Culture   Final    NO GROWTH 2 DAYS Performed at [facility], [address], [place], [place] [address]    Report Status PENDING  Incomplete  Culture, blood (Routine X 2) w Reflex to ID Panel     Status: None (Preliminary result)   Collection Time: 12/26/23  3:05 PM   Specimen: BLOOD  Result Value Ref Range Status   Specimen Description BLOOD AEROBIC BOTTLE ONLY LFOA  Final   Special Requests Blood Culture adequate volume  Final   Culture   Final    NO GROWTH 2 DAYS Performed at [facility], [address], [place], [place] [address]    Report Status PENDING  Incomplete     Time coordinating discharge: 35 minutes  SIGNED:   Derryl Duval, MD  [facility] 12/28/2023, 12:23 PM

## 2023-12-28 NOTE — Plan of Care (Signed)
   Problem: Activity: Goal: Risk for activity intolerance will decrease Outcome: Progressing   Problem: Coping: Goal: Level of anxiety will decrease Outcome: Progressing

## 2023-12-28 NOTE — Progress Notes (Signed)
 Peripherally Inserted Central Catheter Placement  The IV Nurse has discussed with the patient and/or persons authorized to consent for the patient, the purpose of this procedure and the potential benefits and risks involved with this procedure.  The benefits include less needle sticks, lab draws from the catheter, and the patient may be discharged home with the catheter. Risks include, but not limited to, infection, bleeding, blood clot (thrombus formation), and puncture of an artery; nerve damage and irregular heartbeat and possibility to perform a PICC exchange if needed/ordered by physician.  Alternatives to this procedure were also discussed.  Bard Power PICC patient education guide, fact sheet on infection prevention and patient information card has been provided to patient /or left at bedside.    PICC Placement Documentation  PICC Double Lumen 12/28/23 Right Brachial 40 cm 0 cm (Active)  Indication for Insertion or Continuance of Line Prolonged intravenous therapies 12/28/23 1404  Exposed Catheter (cm) 0 cm 12/28/23 1404  Site Assessment Clean, Dry, Intact 12/28/23 1404  Lumen #1 Status Flushed;Blood return noted;Saline locked 12/28/23 1404  Lumen #2 Status Flushed;Blood return noted;Saline locked 12/28/23 1404  Dressing Type Transparent 12/28/23 1404  Dressing Status Antimicrobial disc/dressing in place 12/28/23 1404  Line Care Connections checked and tightened 12/28/23 1404  Line Adjustment (NICU/IV Team Only) No 12/28/23 1404  Dressing Intervention New dressing 12/28/23 1404  Dressing Change Due 01/04/24 12/28/23 1404       Maquita Sandoval Ramos 12/28/2023, 2:06 PM

## 2023-12-28 NOTE — Care Management Important Message (Signed)
 Important Message  Patient Details  Name: Stephanie [name] MRN: [id number] Date of Birth: [birth year]   Important Message Given:  N/A - LOS <3 / Initial given by admissions     Duwaine LITTIE Ada 12/28/2023, 1:28 PM

## 2023-12-28 NOTE — TOC Transition Note (Signed)
 Transition of Care [facility]) - Discharge Note   Patient Details  Name: MAYELI BORNHORST MRN: [id number] Date of Birth: 23-May-1935  [facility]) CM/SW Contact:  Hoy DELENA Bigness, LCSW Phone Number: 12/28/2023, 12:18 PM   Clinical Narrative:    Pt to return home and will be completing 6 week IV abx course. [facility] provided education to pt/daughter and will continue to provide home IV abx and needed DME. [facility] to provide [facility]. HH order is in place. No further ICM needs identified.    Final next level of care: Home w Home Health Services Barriers to Discharge: Barriers Resolved   Patient Goals and CMS Choice Patient states their goals for this hospitalization and ongoing recovery are:: return home   Choice offered to / list presented to : Patient [place] ownership interest in [facility].provided to::  (n/a)    Discharge Placement                       Discharge Plan and Services Additional resources added to the After Visit Summary for   In-house Referral: Clinical Social Work              DME Arranged: IV pump/equipment DME Agency: Other - Comment [employer])       HH Arranged: RN HH Agency: [facility] Date HH Agency Contacted: 12/28/23 Time HH Agency Contacted: 1218 Representative spoke with at [facility] Agency: [facility]  Social Drivers of Health (SDOH) Interventions SDOH Screenings   Food Insecurity: No Food Insecurity (12/24/2023)  Housing: Low Risk  (12/24/2023)  Transportation Needs: No Transportation Needs (12/24/2023)  Utilities: Not At Risk (12/24/2023)  Depression (PHQ2-9): Low Risk  (03/12/2022)  Social Connections: Moderately Isolated (12/24/2023)  Tobacco Use: Medium Risk (12/24/2023)     Readmission Risk Interventions    12/28/2023   12:17 PM 12/26/2023    8:28 AM 09/26/2023   11:50 AM  Readmission Risk Prevention Plan  Transportation Screening Complete Complete Complete  Medication Review Furniture Conservator/restorer) Complete Complete Complete  PCP or Specialist appointment within 3-5 days of discharge Complete  Complete  HRI or Home Care Consult Complete Complete Complete  SW Recovery Care/Counseling Consult Complete Complete Complete  Palliative Care Screening Not Applicable Not Applicable Not Applicable  Skilled Nursing Facility Not Applicable Not Applicable Complete

## 2023-12-28 NOTE — Progress Notes (Signed)
 Mobility Specialist Progress Note:    12/28/23 1005  Mobility  Activity Ambulated with assistance  Level of Assistance Standby assist, set-up cues, supervision of patient - no hands on  Assistive Device Front wheel walker  Distance Ambulated (ft) 240 ft  Range of Motion/Exercises Active;All extremities  Activity Response Tolerated well  Mobility Referral Yes  Mobility visit 1 Mobility  Mobility Specialist Start Time (ACUTE ONLY) 1005  Mobility Specialist Stop Time (ACUTE ONLY) 1025  Mobility Specialist Time Calculation (min) (ACUTE ONLY) 20 min   Pt received in bed, agreeable to mobility. Required SBA to stand and ambulate with no AD. Tolerated well, asx throughout. Left siting EOB, all needs met  Rogelia Kluver Mobility Specialist Please contact via SecureChat or  Rehab office at [phone]

## 2023-12-29 ENCOUNTER — Emergency Department (HOSPITAL_COMMUNITY)

## 2023-12-29 ENCOUNTER — Other Ambulatory Visit: Payer: Self-pay

## 2023-12-29 ENCOUNTER — Inpatient Hospital Stay (HOSPITAL_COMMUNITY)
Admission: EM | Admit: 2023-12-29 | Discharge: 2024-01-04 | DRG: 417 | Disposition: A | Attending: Hospitalist | Admitting: Hospitalist

## 2023-12-29 DIAGNOSIS — E119 Type 2 diabetes mellitus without complications: Secondary | ICD-10-CM

## 2023-12-29 DIAGNOSIS — I480 Paroxysmal atrial fibrillation: Secondary | ICD-10-CM | POA: Diagnosis not present

## 2023-12-29 DIAGNOSIS — Z833 Family history of diabetes mellitus: Secondary | ICD-10-CM | POA: Diagnosis not present

## 2023-12-29 DIAGNOSIS — B952 Enterococcus as the cause of diseases classified elsewhere: Secondary | ICD-10-CM | POA: Diagnosis present

## 2023-12-29 DIAGNOSIS — J45909 Unspecified asthma, uncomplicated: Secondary | ICD-10-CM | POA: Diagnosis present

## 2023-12-29 DIAGNOSIS — R112 Nausea with vomiting, unspecified: Secondary | ICD-10-CM | POA: Diagnosis not present

## 2023-12-29 DIAGNOSIS — N12 Tubulo-interstitial nephritis, not specified as acute or chronic: Secondary | ICD-10-CM

## 2023-12-29 DIAGNOSIS — Z882 Allergy status to sulfonamides status: Secondary | ICD-10-CM | POA: Diagnosis not present

## 2023-12-29 DIAGNOSIS — I10 Essential (primary) hypertension: Secondary | ICD-10-CM | POA: Diagnosis not present

## 2023-12-29 DIAGNOSIS — Z6832 Body mass index (BMI) 32.0-32.9, adult: Secondary | ICD-10-CM | POA: Diagnosis not present

## 2023-12-29 DIAGNOSIS — K567 Ileus, unspecified: Secondary | ICD-10-CM | POA: Diagnosis present

## 2023-12-29 DIAGNOSIS — I5032 Chronic diastolic (congestive) heart failure: Secondary | ICD-10-CM | POA: Diagnosis present

## 2023-12-29 DIAGNOSIS — I160 Hypertensive urgency: Secondary | ICD-10-CM | POA: Diagnosis present

## 2023-12-29 DIAGNOSIS — T826XXA Infection and inflammatory reaction due to cardiac valve prosthesis, initial encounter: Secondary | ICD-10-CM | POA: Diagnosis present

## 2023-12-29 DIAGNOSIS — K810 Acute cholecystitis: Secondary | ICD-10-CM | POA: Diagnosis present

## 2023-12-29 DIAGNOSIS — E11649 Type 2 diabetes mellitus with hypoglycemia without coma: Secondary | ICD-10-CM | POA: Diagnosis present

## 2023-12-29 DIAGNOSIS — N184 Chronic kidney disease, stage 4 (severe): Secondary | ICD-10-CM | POA: Diagnosis present

## 2023-12-29 DIAGNOSIS — J449 Chronic obstructive pulmonary disease, unspecified: Secondary | ICD-10-CM | POA: Diagnosis not present

## 2023-12-29 DIAGNOSIS — I330 Acute and subacute infective endocarditis: Secondary | ICD-10-CM | POA: Diagnosis present

## 2023-12-29 DIAGNOSIS — Z8249 Family history of ischemic heart disease and other diseases of the circulatory system: Secondary | ICD-10-CM | POA: Diagnosis not present

## 2023-12-29 DIAGNOSIS — Z952 Presence of prosthetic heart valve: Secondary | ICD-10-CM | POA: Diagnosis not present

## 2023-12-29 DIAGNOSIS — F17210 Nicotine dependence, cigarettes, uncomplicated: Secondary | ICD-10-CM | POA: Diagnosis present

## 2023-12-29 DIAGNOSIS — I130 Hypertensive heart and chronic kidney disease with heart failure and stage 1 through stage 4 chronic kidney disease, or unspecified chronic kidney disease: Secondary | ICD-10-CM | POA: Diagnosis present

## 2023-12-29 DIAGNOSIS — E1122 Type 2 diabetes mellitus with diabetic chronic kidney disease: Secondary | ICD-10-CM | POA: Diagnosis present

## 2023-12-29 DIAGNOSIS — Z87891 Personal history of nicotine dependence: Secondary | ICD-10-CM | POA: Diagnosis not present

## 2023-12-29 DIAGNOSIS — J4489 Other specified chronic obstructive pulmonary disease: Secondary | ICD-10-CM | POA: Diagnosis present

## 2023-12-29 DIAGNOSIS — E782 Mixed hyperlipidemia: Secondary | ICD-10-CM | POA: Diagnosis present

## 2023-12-29 DIAGNOSIS — N170 Acute kidney failure with tubular necrosis: Secondary | ICD-10-CM | POA: Diagnosis present

## 2023-12-29 DIAGNOSIS — E66811 Obesity, class 1: Secondary | ICD-10-CM | POA: Diagnosis present

## 2023-12-29 DIAGNOSIS — Y831 Surgical operation with implant of artificial internal device as the cause of abnormal reaction of the patient, or of later complication, without mention of misadventure at the time of the procedure: Secondary | ICD-10-CM | POA: Diagnosis present

## 2023-12-29 DIAGNOSIS — Z888 Allergy status to other drugs, medicaments and biological substances status: Secondary | ICD-10-CM | POA: Diagnosis not present

## 2023-12-29 DIAGNOSIS — Z825 Family history of asthma and other chronic lower respiratory diseases: Secondary | ICD-10-CM | POA: Diagnosis not present

## 2023-12-29 LAB — CBC WITH DIFFERENTIAL/PLATELET
Abs Immature Granulocytes: 0.12 K/uL — ABNORMAL HIGH (ref 0.00–0.07)
Basophils Absolute: 0 K/uL (ref 0.0–0.1)
Basophils Relative: 0 %
Eosinophils Absolute: 0.2 K/uL (ref 0.0–0.5)
Eosinophils Relative: 2 %
HCT: 38.5 % (ref 36.0–46.0)
Hemoglobin: 12.5 g/dL (ref 12.0–15.0)
Immature Granulocytes: 1 %
Lymphocytes Relative: 6 %
Lymphs Abs: 0.7 K/uL (ref 0.7–4.0)
MCH: 27.9 pg (ref 26.0–34.0)
MCHC: 32.5 g/dL (ref 30.0–36.0)
MCV: 85.9 fL (ref 80.0–100.0)
Monocytes Absolute: 0.7 K/uL (ref 0.1–1.0)
Monocytes Relative: 7 %
Neutro Abs: 8.7 K/uL — ABNORMAL HIGH (ref 1.7–7.7)
Neutrophils Relative %: 84 %
Platelets: 154 K/uL (ref 150–400)
RBC: 4.48 MIL/uL (ref 3.87–5.11)
RDW: 15.7 % — ABNORMAL HIGH (ref 11.5–15.5)
WBC: 10.5 K/uL (ref 4.0–10.5)
nRBC: 0 % (ref 0.0–0.2)

## 2023-12-29 LAB — URINALYSIS, ROUTINE W REFLEX MICROSCOPIC
Bacteria, UA: NONE SEEN
Bilirubin Urine: NEGATIVE
Glucose, UA: 50 mg/dL — AB
Hgb urine dipstick: NEGATIVE
Ketones, ur: 5 mg/dL — AB
Leukocytes,Ua: NEGATIVE
Nitrite: NEGATIVE
Protein, ur: 100 mg/dL — AB
Specific Gravity, Urine: 1.014 (ref 1.005–1.030)
pH: 5 (ref 5.0–8.0)

## 2023-12-29 LAB — LIPASE, BLOOD: Lipase: 29 U/L (ref 11–51)

## 2023-12-29 LAB — COMPREHENSIVE METABOLIC PANEL WITH GFR
ALT: 6 U/L (ref 0–44)
AST: 19 U/L (ref 15–41)
Albumin: 3.7 g/dL (ref 3.5–5.0)
Alkaline Phosphatase: 103 U/L (ref 38–126)
Anion gap: 15 (ref 5–15)
BUN: 11 mg/dL (ref 8–23)
CO2: 22 mmol/L (ref 22–32)
Calcium: 9.1 mg/dL (ref 8.9–10.3)
Chloride: 97 mmol/L — ABNORMAL LOW (ref 98–111)
Creatinine, Ser: 1.17 mg/dL — ABNORMAL HIGH (ref 0.44–1.00)
GFR, Estimated: 45 mL/min — ABNORMAL LOW (ref >60)
Glucose, Bld: 157 mg/dL — ABNORMAL HIGH (ref 70–99)
Potassium: 4.4 mmol/L (ref 3.5–5.1)
Sodium: 134 mmol/L — ABNORMAL LOW (ref 135–145)
Total Bilirubin: 0.3 mg/dL (ref 0.0–1.2)
Total Protein: 6.8 g/dL (ref 6.5–8.1)

## 2023-12-29 MED ORDER — ONDANSETRON 4 MG PO TBDP
4.0000 mg | ORAL_TABLET | Freq: Once | ORAL | Status: AC
  Administered 2023-12-29: 4 mg via ORAL
  Filled 2023-12-29: qty 1

## 2023-12-29 MED ORDER — CARVEDILOL 3.125 MG PO TABS: 6.2500 mg | ORAL_TABLET | Freq: Two times a day (BID) | ORAL | Status: DC

## 2023-12-29 MED ORDER — HYDRALAZINE HCL 20 MG/ML IJ SOLN: 10.0000 mg | INTRAMUSCULAR | Status: DC

## 2023-12-29 MED ORDER — SODIUM CHLORIDE 0.9 % IV SOLN
2.0000 g | Freq: Two times a day (BID) | INTRAVENOUS | Status: DC
  Administered 2023-12-29 – 2024-01-04 (×12): 2 g via INTRAVENOUS
  Filled 2023-12-29 (×11): qty 20

## 2023-12-29 MED ORDER — SODIUM CHLORIDE 0.9 % IV SOLN: 2.0000 g | INTRAVENOUS | Status: DC

## 2023-12-29 MED ORDER — METRONIDAZOLE 500 MG/100ML IV SOLN
500.0000 mg | Freq: Two times a day (BID) | INTRAVENOUS | Status: DC
  Administered 2023-12-29 – 2024-01-04 (×12): 500 mg via INTRAVENOUS
  Filled 2023-12-29 (×12): qty 100

## 2023-12-29 MED ORDER — MORPHINE SULFATE (PF) 2 MG/ML IV SOLN
2.0000 mg | Freq: Once | INTRAVENOUS | Status: AC
  Administered 2023-12-29: 2 mg via INTRAVENOUS
  Filled 2023-12-29: qty 1

## 2023-12-29 MED ORDER — HYDRALAZINE HCL 20 MG/ML IJ SOLN
20.0000 mg | Freq: Once | INTRAMUSCULAR | Status: AC
  Administered 2023-12-29: 20 mg via INTRAVENOUS
  Filled 2023-12-29: qty 1

## 2023-12-29 MED ORDER — SODIUM CHLORIDE 0.9 % IV SOLN
2.0000 g | Freq: Four times a day (QID) | INTRAVENOUS | Status: DC
  Administered 2023-12-29 – 2023-12-31 (×8): 2 g via INTRAVENOUS
  Filled 2023-12-29 (×15): qty 2000

## 2023-12-29 MED ORDER — ISOSORBIDE MONONITRATE ER 30 MG PO TB24
30.0000 mg | ORAL_TABLET | Freq: Every day | ORAL | Status: DC
  Administered 2023-12-29: 30 mg via ORAL
  Filled 2023-12-29: qty 1

## 2023-12-29 MED ORDER — AMLODIPINE BESYLATE 5 MG PO TABS
2.5000 mg | ORAL_TABLET | Freq: Every day | ORAL | Status: DC
  Administered 2023-12-29: 2.5 mg via ORAL
  Filled 2023-12-29: qty 1

## 2023-12-29 MED ORDER — HYDRALAZINE HCL 20 MG/ML IJ SOLN
10.0000 mg | INTRAMUSCULAR | Status: DC | PRN
  Administered 2023-12-29: 10 mg via INTRAVENOUS
  Filled 2023-12-29 (×2): qty 1

## 2023-12-29 MED ORDER — SODIUM CHLORIDE 0.9 % IV SOLN: INTRAVENOUS | Status: DC

## 2023-12-29 MED ORDER — HYDRALAZINE HCL 50 MG PO TABS
100.0000 mg | ORAL_TABLET | Freq: Three times a day (TID) | ORAL | Status: DC
  Administered 2023-12-29: 100 mg via ORAL
  Filled 2023-12-29: qty 2

## 2023-12-29 MED ORDER — MORPHINE SULFATE (PF) 2 MG/ML IV SOLN
2.0000 mg | INTRAVENOUS | Status: AC | PRN
  Administered 2023-12-29: 2 mg via INTRAVENOUS
  Filled 2023-12-29: qty 1

## 2023-12-29 MED ORDER — SODIUM CHLORIDE 0.9 % IV SOLN
12.5000 mg | Freq: Four times a day (QID) | INTRAVENOUS | Status: DC | PRN
  Administered 2023-12-29: 12.5 mg via INTRAVENOUS
  Filled 2023-12-29: qty 0.5

## 2023-12-29 MED ORDER — CARVEDILOL 3.125 MG PO TABS
6.2500 mg | ORAL_TABLET | Freq: Two times a day (BID) | ORAL | Status: DC
  Administered 2023-12-29: 6.25 mg via ORAL
  Filled 2023-12-29: qty 2

## 2023-12-29 MED ORDER — IOHEXOL 300 MG/ML  SOLN
80.0000 mL | Freq: Once | INTRAMUSCULAR | Status: AC | PRN
  Administered 2023-12-29: 80 mL via INTRAVENOUS

## 2023-12-29 MED ORDER — ONDANSETRON HCL 4 MG/2ML IJ SOLN
4.0000 mg | Freq: Four times a day (QID) | INTRAMUSCULAR | Status: DC | PRN
  Administered 2023-12-29: 4 mg via INTRAVENOUS
  Filled 2023-12-29: qty 2

## 2023-12-29 NOTE — ED Notes (Signed)
 AC called for ampicillin 

## 2023-12-29 NOTE — ED Triage Notes (Signed)
 Pt [facility] via [facility]. Pt called EMS due to having N/V. Pt denies having diarrhea. Pt states she's just throwing up clear liquid and is unable to tolerate food or drink. EMS BP 210/90, CBG 130. Pt was admitted on Saturday to the hospital and was discharged home yesterday.

## 2023-12-29 NOTE — ED Provider Notes (Signed)
 [place] EMERGENCY DEPARTMENT AT [facility] Provider Note   CSN: [id number] Arrival date & time: 12/29/23  9183     Patient presents with: Nausea   Stephanie [name] is a 88 y.o. female.   HPI   This patient is an 88 year old female she was recently admitted to the hospital with sepsis on December 6, the patient was discharged yesterday, ultimately the patient was diagnosed with sepsis and had bacterial cultures showing Enterococcus faecalis and the urine cultures grew E. coli.  Abdominal pelvic CT did not show any sources of infection, infectious disease was consulted for further recommendations and the patient was discharged with a PICC line and antibiotics.  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  The patient states that she started to have some abdominal pain this morning, she presents with nausea and vomiting by paramedic transport.  Based on the discharge information the patient was supposed to be taking ampicillin  continuous infusion through January 18.  She was supposed to be on an ambulatory pump which is a continuous infusion through a PICC line in the right upper extremity.  Prior to Admission medications  Medication Sig Start Date End Date Taking? Authorizing Provider  albuterol  (PROVENTIL ) (2.5 MG/3ML) 0.083% nebulizer solution Take 3 mLs (2.5 mg total) by nebulization every 2 (two) hours as needed for wheezing or shortness of breath. 08/12/23  Yes Emokpae, Courage, MD  amLODipine  (NORVASC ) 2.5 MG tablet Take 1 tablet (2.5 mg total) by mouth daily. 09/26/23  Yes TatAlm, MD  Ampicillin  Sodium 10 g SOLR  12/28/23  Yes [provider]  carvedilol  (COREG ) 6.25 MG tablet Take 6.25 mg by mouth 2 (two) times daily with a meal.   Yes [provider]  cefTRIAXone  (ROCEPHIN ) IVPB Inject 2 g into the  vein every 12 (twelve) hours. Indication:  Enterococcal endocarditis First Dose: Yes Last Day of Therapy:  02/05/24 Labs - Once weekly:  CBC/D and BMP, Labs - Once weekly: ESR and CRP Method of administration: IV Push Method of administration may be changed at the discretion of home infusion pharmacist based upon assessment of the patient and/or caregiver's ability to self-administer the medication ordered. 12/27/23 02/05/24 Yes Fleeta Kathie Jomarie LOISE, MD  cetirizine (ZYRTEC) 10 MG tablet Take 10 mg by mouth at bedtime. 12/21/23  Yes [provider]  cinacalcet  (SENSIPAR ) 30 MG tablet Take 30 mg by mouth daily. 03/31/23  Yes [provider]  FEROSUL 325 (65 Fe) MG tablet Take 325 mg by mouth daily.   Yes [provider]  furosemide  (LASIX ) 20 MG tablet Take 1 tablet (20 mg total) by mouth daily as needed for edema. Shortness of breath and weight gain 12/01/23  Yes Miriam Norris, NP  gabapentin  (NEURONTIN ) 100 MG capsule Take 100-200 mg by mouth at bedtime. 10/02/19  Yes [provider]  GNP VITAMIN D3 EXTRA STRENGTH 25 MCG (1000 UT) tablet Take 1,000 Units by mouth daily. 12/21/23  Yes [provider]  hydrALAZINE  (APRESOLINE ) 100 MG tablet Take 100 mg by mouth 3 (three) times daily. 12/21/23  Yes [provider]  isosorbide  mononitrate (IMDUR ) 30 MG 24 hr tablet Take 1 tablet (30 mg total) by mouth daily. 08/12/23 08/11/24 Yes Emokpae, Courage, MD  ondansetron  (ZOFRAN ) 4 MG tablet Take 1 tablet (4 mg total) by mouth every 6 (six)  hours. As needed for nausea vomiting 12/21/23  Yes Triplett, Tammy, PA-C  pantoprazole  (PROTONIX ) 20 MG tablet Take 20 mg by mouth daily. 12/21/23  Yes [provider]  STIOLTO RESPIMAT  2.5-2.5 MCG/ACT AERS Inhale 2 puffs into the lungs daily. 12/21/23  Yes [provider]  ampicillin  IVPB Inject 6 g into the vein daily. As a continuous infusion. Indication:  Enterococcal endocarditis First Dose: Yes Last Day of  Therapy:  02/05/24 Labs - Once weekly:  CBC/D and BMP, Labs - Once weekly: ESR and CRP Method of administration: Ambulatory Pump (Continuous Infusion) Method of administration may be changed at the discretion of home infusion pharmacist based upon assessment of the patient and/or caregiver's ability to self-administer the medication ordered. 12/27/23 02/05/24  Fleeta Kathie Jomarie LOISE, MD    Allergies: Nitrofurantoin and Sulfa antibiotics    Review of Systems  All other systems reviewed and are negative.   Updated Vital Signs BP (!) 185/136   Pulse 74   Temp 98.9 F (37.2 C)   Resp 20   Ht 1.651 m (5' 5)   Wt 89 kg   SpO2 94%   BMI 32.65 kg/m   Physical Exam Vitals and nursing note reviewed.  Constitutional:      General: She is not in acute distress.    Appearance: She is well-developed.     Comments: Uncomfortable appearing  HENT:     Head: Normocephalic and atraumatic.     Mouth/Throat:     Pharynx: No oropharyngeal exudate.  Eyes:     General: No scleral icterus.       Right eye: No discharge.        Left eye: No discharge.     Conjunctiva/sclera: Conjunctivae normal.     Pupils: Pupils are equal, round, and reactive to light.  Neck:     Thyroid : No thyromegaly.     Vascular: No JVD.  Cardiovascular:     Rate and Rhythm: Normal rate and regular rhythm.     Heart sounds: Normal heart sounds. No murmur heard.    No friction rub. No gallop.  Pulmonary:     Effort: Pulmonary effort is normal. No respiratory distress.     Breath sounds: Normal breath sounds. No wheezing or rales.  Abdominal:     General: Bowel sounds are normal. There is no distension.     Palpations: Abdomen is soft. There is no mass.     Tenderness: There is abdominal tenderness.  Musculoskeletal:        General: No tenderness. Normal range of motion.     Cervical back: Normal range of motion and neck supple.     Right lower leg: No edema.     Left lower leg: No edema.  Lymphadenopathy:      Cervical: No cervical adenopathy.  Skin:    General: Skin is warm and dry.     Findings: No erythema or rash.  Neurological:     General: No focal deficit present.     Mental Status: She is alert.     Coordination: Coordination normal.  Psychiatric:        Behavior: Behavior normal.     (all labs ordered are listed, but only abnormal results are displayed) Labs Reviewed  CBC WITH DIFFERENTIAL/PLATELET - Abnormal; Notable for the following components:      Result Value   RDW 15.7 (*)    Neutro Abs 8.7 (*)    Abs Immature Granulocytes 0.12 (*)    All  other components within normal limits  COMPREHENSIVE METABOLIC PANEL WITH GFR - Abnormal; Notable for the following components:   Sodium 134 (*)    Chloride 97 (*)    Glucose, Bld 157 (*)    Creatinine, Ser 1.17 (*)    GFR, Estimated 45 (*)    All other components within normal limits  URINALYSIS, ROUTINE W REFLEX MICROSCOPIC - Abnormal; Notable for the following components:   Glucose, UA 50 (*)    Ketones, ur 5 (*)    Protein, ur 100 (*)    All other components within normal limits  LIPASE, BLOOD    EKG: None  Radiology: CT ABDOMEN PELVIS W CONTRAST Result Date: 12/29/2023 CLINICAL DATA:  Acute abdominal pain and vomiting. 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 EXAM: CT ABDOMEN AND PELVIS WITH CONTRAST TECHNIQUE: Multidetector CT imaging of the abdomen and pelvis was performed using the standard protocol following bolus administration of intravenous contrast. RADIATION DOSE REDUCTION: This exam was performed according to the departmental dose-optimization program which includes automated exposure control, adjustment of the mA and/or kV according to patient size and/or use of iterative reconstruction technique. CONTRAST:  80mL OMNIPAQUE  IOHEXOL  300 MG/ML  SOLN COMPARISON:  12/21/2023, 11/25/2020 FINDINGS: Lower chest: Mild stable cardiomegaly. Mild calcified plaque over the descending thoracic aorta. Visualized lung  bases demonstrate no acute process. Hepatobiliary: Several small liver cysts unchanged. Mild dilatation of the gallbladder with slight ill definition of the wall. No definite cholelithiasis. Biliary tree is otherwise unremarkable. Pancreas: Normal. Spleen: Normal. Adrenals/Urinary Tract: Right adrenal gland is normal. 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 Kidneys are normal in size. Several bilateral renal cysts are present and unchanged. There is a new hypoechoic area over the mid to upper pole lateral left renal cortex measuring approximately 1.6 x 2.7 cm with a smaller similar hypoechoic area over the mid to lower pole left renal cortex. These findings were not present on previous exams as the larger upper pole hypoechoic area is new compared to 12/21/2023. Mild stranding of the left perinephric fat. Findings likely due to pyelonephritis. Ureters and bladder are unremarkable. Stomach/Bowel: Possible small sliding hiatal hernia. Stomach is otherwise unremarkable. Small bowel is normal. Significant diverticulosis throughout the sigmoid colon without active inflammation. Moderate diverticulosis over the descending colon. Subtle thin rimmed oval fatty structure adjacent the lateral aspect of the descending colon without significant change as this can be seen with the people appendagitis. Prior appendectomy. Vascular/Lymphatic: Moderate calcified plaque over the abdominal aorta which is normal in caliber. No adenopathy. Reproductive: Uterus and bilateral adnexa are unremarkable. Other: No free peritoneal air. Musculoskeletal: Posterior fusion hardware intact from L2-S1. IMPRESSION: 1. New hypoechoic areas over the mid to upper pole and mid to lower pole left renal cortex with mild stranding of the left perinephric fat. Findings likely due to pyelonephritis. 2. Mild dilatation of the gallbladder with slight ill definition of the wall. No definite cholelithiasis.  Recommend right upper quadrant ultrasound if concern for acute cholecystitis. 3. 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 4. Colonic diverticulosis without active inflammation. Findings adjacent the distal descending colon unchanged from 12/21/2023, although new since 10/18/2023 which can be seen with epiploic appendagitis. 5. Aortic atherosclerosis. Aortic Atherosclerosis (ICD10-I70.0). Electronically Signed   By: Toribio Agreste M.D.   On: 12/29/2023 13:35   CT Head Wo Contrast Result Date: 12/29/2023 EXAM: CT Head Without Intravenous Contrast. CLINICAL HISTORY: Altered mental  status, nontraumatic (Ped 0-17y); severe hypertension, vomiting. TECHNIQUE: Axial computed tomography images of the head/brain without intravenous contrast. Dose reduction technique was used including one or more of the following: automated exposure control, adjustment of mA and kV according to patient size, and/or iterative reconstruction. CONTRAST: Without. COMPARISON: CT head 07/03/2021. FINDINGS: BRAIN: No acute intraparenchymal hemorrhage. No mass lesion. No CT evidence for acute territorial infarct. No midline shift or extra-axial collection. Mild chronic microvascular ischemic changes and mild age related parenchymal volume loss. 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 Atherosclerosis of the carotid siphons. VENTRICLES: No hydrocephalus. ORBITS: Bilateral lens replacements. SINUSES AND MASTOIDS: Mucosal thickening in the left sphenoid sinus with thickening of the sinus wall suggestive of mucoperiosteal reaction compatible with chronic sinusitis. The remaining paranasal sinuses and mastoid air cells are clear. SOFT TISSUES: No significant facial or scalp soft tissue swelling evident. No radiopaque foreign body is seen. BONES: No acute skull fracture. IMPRESSION: 1. No acute findings. 2. 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 3.  Mild chronic microvascular ischemic changes and mild age-related parenchymal volume loss. 4. Mucosal thickening in the left sphenoid sinus with thickening of the sinus wall and mucoperiosteal reaction compatible with chronic sinusitis. Electronically signed by: Donnice Mania MD 12/29/2023 01:03 PM EST RP Workstation: [id number]   DG Abdomen Acute W/Chest Result Date: 12/29/2023 EXAM: XR Acute Abdomen Series with XR Chest, upright and supine views of the abdomen, frontal view of the chest, 4 Views. CLINICAL HISTORY: abd pain / vomiting COMPARISON: 12/24/2023 FINDINGS: LUNGS: Vascular congestion without overt edema. Pulmonary nodule at the right base with adjacent fiducial marker as characterized by CT on 10/08/2023. No focal airspace consolidation. PLEURAL SPACES: No pleural effusion. No pneumothorax. HEART: Cardiomegaly. MEDIASTINUM: Unremarkable. PERITONEUM: The upright film shows no intraperitoneal free air. BOWEL: No gaseous bowel dilatation to suggest obstruction. BONES: Lumbar spinal fusion hardware evident. No acute osseous abnormality. IMPRESSION: 1. No radiographic evidence of bowel obstruction or perforation. 2. Cardiomegaly with pulmonary vascular congestion, without overt edema. 3. Right basilar pulmonary nodule with adjacent fiducial marker, as previously characterized on CT dated 10/08/2023. Electronically signed by: Camellia Candle MD 12/29/2023 09:39 AM EST RP Workstation: [id number]   US  EKG SITE RITE Result Date: 12/27/2023 If Site Rite image not attached, placement could not be confirmed due to current cardiac rhythm.    .Critical Care  Performed by: Cleotilde Rogue, MD Authorized by: Cleotilde Rogue, MD   Critical care provider statement:    Critical care time (minutes):  45   Critical care time was exclusive of:  Separately billable procedures and treating other patients and teaching time   Critical care was necessary to treat or prevent imminent or life-threatening deterioration of the  following conditions: hypertensive crisis.   Critical care was time spent personally by me on the following activities:  Development of treatment plan with patient or surrogate, discussions with consultants, evaluation of patient's response to treatment, examination of patient, obtaining history from patient or surrogate, review of old charts, re-evaluation of patient's condition, pulse oximetry, ordering and review of radiographic studies, ordering and review of laboratory studies and ordering and performing treatments and interventions   Care discussed with: admitting provider      Medications Ordered in the ED  0.9 %  sodium chloride  infusion ( Intravenous New Bag/Given 12/29/23 0904)  amLODipine  (NORVASC ) tablet 2.5 mg (2.5 mg Oral Given 12/29/23 1116)  hydrALAZINE  (APRESOLINE ) tablet 100 mg (100 mg Oral Given 12/29/23 1116)  isosorbide  mononitrate (IMDUR ) 24 hr tablet 30 mg (30 mg Oral Given 12/29/23 1116)  carvedilol  (COREG ) tablet 6.25 mg (6.25 mg Oral Given 12/29/23 1116)  hydrALAZINE  (APRESOLINE ) injection 10 mg (has no administration in time range)  ondansetron  (ZOFRAN -ODT) disintegrating tablet 4 mg (4 mg Oral Given 12/29/23 0839)  hydrALAZINE  (APRESOLINE ) injection 20 mg (20 mg Intravenous Given 12/29/23 1200)  iohexol  (OMNIPAQUE ) 300 MG/ML solution 80 mL (80 mLs Intravenous Contrast Given 12/29/23 1218)    Clinical Course as of 12/29/23 1529  Thu Dec 29, 2023  0923  Radiology Imaging: I personally viewed the images of the ordered radiographic studies and find acute abdominal series shows hardware in the lumbar spine appears to be intact, no air-fluid levels, no subdiaphragmatic air, chest x-ray without any obvious infiltrates, mild cardiomegaly was seen, stool scattered throughout the department I agree with the radiologist interpretation as well  [BM]    Clinical Course User Index [BM] Cleotilde Rogue, MD                                 Medical Decision Making Amount and/or  Complexity of Data Reviewed Labs: ordered. Radiology: ordered.  Risk Prescription drug management. Decision regarding hospitalization.    This patient presents to the ED for concern of nausea and vomiting, this involves an extensive number of treatment options, and is a complaint that carries with it a high risk of complications and morbidity.  The differential diagnosis includes potentially related to abdominal causes and she is tender in the abdomen and that is her only other complaint in addition to nausea and vomiting.  She did recently have a CT scan which showed no significant abnormalities so unsure of the cause otherwise   Co morbidities / Chronic conditions that complicate the patient evaluation  Recently admitted, considering endocarditis   Additional history obtained:  Additional history obtained from EMR External records from outside source obtained and reviewed including medical record review including prior admission infectious disease recommendations and discharge summaries   Lab Tests:  I Ordered, and personally interpreted labs.  The pertinent results include: No significant leukocytosis or renal dysfunction or electrolyte abnormalities, urinalysis with small amount of ketones and protein, no bacteria seen, no anemia, lipase normal   Imaging Studies ordered:  I ordered imaging studies including CT scan of the head negative for any acute findings, CT scan of the abdomen and pelvis negative for any acute surgical findings though it does show evidence of pyelonephritis in the left renal cortex I independently visualized and interpreted imaging which showed as above I agree with the radiologist interpretation   Cardiac Monitoring: / EKG:  The patient was maintained on a cardiac monitor.  I personally viewed and interpreted the cardiac monitored which showed an underlying rhythm of: Sinus rhythm heart severe hypertension   Problem List / ED Course / Critical  interventions / Medication management  Due to severe hypertension the patient required multiple doses of IV antihypertensives and continued to be severely hypertensive at 185/136 with nausea and vomiting I ordered medication including Zofran , fluids, multiple antihypertensives Reevaluation of the patient after these medicines showed that the patient minimal improvement I have reviewed the patients home medicines and have made adjustments as needed   Consultations Obtained:  I requested consultation with the hospitalist Dr. Pearlean,  and discussed lab and imaging findings as well as pertinent plan - they recommend: And agree with admission to the hospital  Social Determinants of Health:  Elderly, ill-appearing   Test / Admission - Considered:  Admit to high level of care      Final diagnoses:  Severe hypertension  Nausea and vomiting, unspecified vomiting type  Pyelonephritis    ED Discharge Orders     None          Cleotilde Rogue, MD 12/29/23 1529

## 2023-12-29 NOTE — H&P (Addendum)
 History and Physical    Stephanie [name] FMW:[id number] DOB: 1935-09-04 DOA: 12/29/2023  PCP: [facility]   Patient coming from: Home  I have personally briefly reviewed patient's old medical records in [facility]  Chief Complaint: Vomiting  HPI: Stephanie [name] is a 88 y.o. female with medical history significant for congestive heart failure, CKD 4, diabetes mellitus, COPD, asthma, TAVR, paroxysmal atrial fibrillation, recent enterococcal bacteremia.  Patient was just discharged from the hospital-hospitalized 12/6 to 12/10 for sepsis secondary to enterococcal bacteremia, source of infection was unclear, hide urine cultures grew E. coli.  TTE was without vegetation, TEE not done -due to age, pulmonary function and obesity.  She was treated presumptively for prosthetic valve endocarditis. She was discharged home with home health to complete a 6-week course of IV antibiotic-continuous IV ampicillin  infusion and ceftriaxone  2 g daily, via PICC line.  Anticipated stop date was 02/05/2023.  She reports onset of vomiting, several episodes today.  With diffuse abdominal pain.  No diarrhea.  No blood or coffee grounds in vomitus.  Denies prior similar episodes.  ED Course: Temperature 98.  Heart rate 48- 74.  Blood pressure systolic elevated to 219/103.  O2 sat greater than 90% on room air. Unremarkable CBC BMP.  UA not suggestive of UTI.  Chest x-ray shows cardiomegaly, pulmonary vascular congestion without edema.  Head CT shows remote infarcts. CT Abd and Pelvis with contrast shows left pyelonephritis, mild gallbladder dilation, RUQ us  recommended. RUQ Us -acute cholecystitis. Started on IV fluid normal saline at 250 cc/h.  Review of Systems: As per HPI all other systems reviewed and negative.  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 Wonda and Dr. Lucas.   Severe aortic stenosis    Symptomatic anemia    Presumed GI bleed January 2021 - she declined GI work-up   Type 2 diabetes mellitus [facility])     Past Surgical History:  Procedure Laterality Date   APPENDECTOMY     BACK SURGERY     BRONCHIAL BIOPSY  03/02/2022   Procedure: BRONCHIAL BIOPSIES;  Surgeon: Brenna Adine LITTIE, DO;  Location: [facility];  Service: Pulmonary;;   BRONCHIAL BRUSHINGS  03/02/2022   Procedure: BRONCHIAL BRUSHINGS;  Surgeon: Brenna Adine LITTIE, DO;  Location: [facility];  Service: Pulmonary;;   BRONCHIAL NEEDLE ASPIRATION BIOPSY  03/02/2022   Procedure: BRONCHIAL NEEDLE ASPIRATION BIOPSIES;  Surgeon: Brenna Adine LITTIE, DO;  Location: [facility] ENDOSCOPY;  Service: Pulmonary;;   CYST REMOVAL HAND     Right, hospital in [place]   FIDUCIAL MARKER PLACEMENT  03/02/2022   Procedure: FIDUCIAL MARKER PLACEMENT;  Surgeon: Brenna Adine LITTIE, DO;  Location: [facility]OS[facility];  Service: Pulmonary;;   INTRAOPERATIVE TRANSTHORACIC ECHOCARDIOGRAM Left 07/01/2020   Procedure: INTRAOPERATIVE TRANSTHORACIC ECHOCARDIOGRAM;  Surgeon: Wonda Sharper, MD;  Location: [facility] OR;  Service: Open Heart Surgery;  Laterality: Left;   KNEE ARTHROSCOPY WITH MEDIAL MENISECTOMY Right 02/28/2012   Procedure: KNEE ARTHROSCOPY WITH MEDIAL MENISECTOMY;  Surgeon: Taft FORBES Minerva, MD;  Location: [facility];  Service: Orthopedics;  Laterality: Right;   REPAIR KNEE  LIGAMENT     RIGHT/LEFT HEART CATH AND CORONARY ANGIOGRAPHY N/A 04/10/2020   Procedure: RIGHT/LEFT HEART CATH AND CORONARY ANGIOGRAPHY;  Surgeon: Dann Candyce RAMAN, MD;  Location: [facility] INVASIVE CV LAB;  Service: Cardiovascular;  Laterality: N/A;   TRANSCATHETER AORTIC VALVE REPLACEMENT, TRANSFEMORAL N/A 07/01/2020   Procedure:  TRANSCATHETER AORTIC VALVE REPLACEMENT, TRANSFEMORAL;  Surgeon: Wonda Sharper, MD;  Location: [facility] OR;  Service: Open Heart Surgery;  Laterality: N/A;   ULTRASOUND GUIDANCE FOR VASCULAR ACCESS Bilateral 07/01/2020   Procedure: ULTRASOUND GUIDANCE FOR VASCULAR ACCESS;  Surgeon: Wonda Sharper, MD;  Location: [facility] OR;  Service: Open Heart Surgery;  Laterality: Bilateral;     reports that she has quit smoking. Her smoking use included cigarettes. She has never been exposed to tobacco smoke. She has never used smokeless tobacco. She reports that she does not drink alcohol and does not use drugs.  Allergies[1]  Family History  Problem Relation Age of Onset   Lung disease Other    Cancer Other    Arthritis Other    Asthma Other    Diabetes Other    Diabetes Mother    Hypertension Mother    Diverticulitis Mother    Diabetes Father    Hypertension Sister    Diabetes Brother    Colon cancer Neg Hx     Acceptable: Family history reviewed and not pertinent (If you reviewed it)  Prior to Admission medications  Medication Sig Start Date End Date Taking? Authorizing Provider  albuterol  (PROVENTIL ) (2.5 MG/3ML) 0.083% nebulizer solution Take 3 mLs (2.5 mg total) by nebulization every 2 (two) hours as needed for wheezing or shortness of breath. 08/12/23  Yes Clifton Kovacic, Courage, MD  amLODipine  (NORVASC ) 2.5 MG tablet Take 1 tablet (2.5 mg total) by mouth daily. 09/26/23  Yes TatAlm, MD  Ampicillin  Sodium 10 g SOLR  12/28/23  Yes [provider]  carvedilol  (COREG ) 6.25 MG tablet Take 6.25 mg by mouth 2 (two) times daily with a meal.   Yes [provider]  cefTRIAXone  (ROCEPHIN ) IVPB Inject 2 g into the vein every 12 (twelve) hours. Indication:  Enterococcal endocarditis First Dose: Yes Last Day of Therapy:  02/05/24 Labs - Once weekly:  CBC/D and BMP, Labs - Once weekly: ESR and CRP Method of administration: IV Push Method of administration may be changed at the discretion of home  infusion pharmacist based upon assessment of the patient and/or caregiver's ability to self-administer the medication ordered. 12/27/23 02/05/24 Yes Fleeta Kathie Jomarie LOISE, MD  cetirizine (ZYRTEC) 10 MG tablet Take 10 mg by mouth at bedtime. 12/21/23  Yes [provider]  cinacalcet  (SENSIPAR ) 30 MG tablet Take 30 mg by mouth daily. 03/31/23  Yes [provider]  FEROSUL 325 (65 Fe) MG tablet Take 325 mg by mouth daily.   Yes [provider]  furosemide  (LASIX ) 20 MG tablet Take 1 tablet (20 mg total) by mouth daily as needed for edema. Shortness of breath and weight gain 12/01/23  Yes Miriam Norris, NP  gabapentin  (NEURONTIN ) 100 MG capsule Take 100-200 mg by mouth at bedtime. 10/02/19  Yes [provider]  GNP VITAMIN D3 EXTRA STRENGTH 25 MCG (1000 UT) tablet Take 1,000 Units by mouth daily. 12/21/23  Yes [provider]  hydrALAZINE  (APRESOLINE ) 100 MG tablet Take 100 mg by mouth 3 (three) times daily. 12/21/23  Yes [provider]  isosorbide  mononitrate (IMDUR ) 30 MG 24 hr tablet Take 1 tablet (30 mg total) by mouth daily. 08/12/23 08/11/24 Yes  Pearlean Manus, MD  ondansetron  (ZOFRAN ) 4 MG tablet Take 1 tablet (4 mg total) by mouth every 6 (six) hours. As needed for nausea vomiting 12/21/23  Yes Triplett, Tammy, PA-C  pantoprazole  (PROTONIX ) 20 MG tablet Take 20 mg by mouth daily. 12/21/23  Yes [provider]  STIOLTO RESPIMAT  2.5-2.5 MCG/ACT AERS Inhale 2 puffs into the lungs daily. 12/21/23  Yes [provider]  ampicillin  IVPB Inject 6 g into the vein daily. As a continuous infusion. Indication:  Enterococcal endocarditis First Dose: Yes Last Day of Therapy:  02/05/24 Labs - Once weekly:  CBC/D and BMP, Labs - Once weekly: ESR and CRP Method of administration: Ambulatory Pump (Continuous Infusion) Method of administration may be changed at the discretion of home infusion pharmacist based upon assessment of the patient and/or  caregiver's ability to self-administer the medication ordered. 12/27/23 02/05/24  Fleeta Kathie Jomarie LOISE, MD    Physical Exam: Vitals:   12/29/23 1300 12/29/23 1411 12/29/23 1430 12/29/23 1440  BP: 100/68 (!) 154/87 (!) 205/91 (!) 185/136  Pulse: 73 67 74 74  Resp:      Temp:      SpO2: 90% 96% 95% 94%  Weight:      Height:        Constitutional: Vomiting,  Vitals:   12/29/23 1300 12/29/23 1411 12/29/23 1430 12/29/23 1440  BP: 100/68 (!) 154/87 (!) 205/91 (!) 185/136  Pulse: 73 67 74 74  Resp:      Temp:      SpO2: 90% 96% 95% 94%  Weight:      Height:       Eyes: PERRL, lids and conjunctivae normal ENMT: Mucous membranes are moist..  Neck: normal, supple, no masses, no thyromegaly Respiratory: clear to auscultation bilaterally, no wheezing, no crackles. Normal respiratory effort. No accessory muscle use.  Cardiovascular: Regular rate and rhythm, no murmurs / rubs / gallops. No extremity edema.  Extremities warm. Abdomen: Diffuse abdominal tenderness-worse in the right upper quadrant, no masses palpated. No hepatosplenomegaly. Bowel sounds positive.  Musculoskeletal: no clubbing / cyanosis. No joint deformity upper and lower extremities.  Skin: no rashes, lesions, ulcers. No induration Neurologic: No facial asymmetry, moves extremity spontaneously, speech fluent Psychiatric: Normal judgment and insight. Alert and oriented x 3. Normal mood.   Labs on Admission: I have personally reviewed following labs and imaging studies  CBC: Recent Labs  Lab 12/24/23 1420 12/25/23 0512 12/26/23 0710 12/27/23 0417 12/29/23 0906  WBC 16.7* 17.3* 13.1* 11.3* 10.5  NEUTROABS  --   --   --   --  8.7*  HGB 12.4 11.8* 11.5* 11.1* 12.5  HCT 37.9 36.0 35.0* 34.1* 38.5  MCV 84.6 85.1 85.8 85.7 85.9  PLT 175 153 125* 130* 154   Basic Metabolic Panel: Recent Labs  Lab 12/24/23 1420 12/25/23 0512 12/26/23 0510 12/28/23 0502 12/29/23 0906  NA 131* 133* 133* 136 134*  K 4.4 4.1 3.8 4.1  4.4  CL 96* 98 98 100 97*  CO2 22 28 21* 27 22  GLUCOSE 162* 116* 95 109* 157*  BUN 16 16 21 15 11   CREATININE 1.49* 1.65* 1.69* 1.42* 1.17*  CALCIUM  8.9 8.9 8.9 9.0 9.1  PHOS  --  2.7  --   --   --    GFR: Estimated Creatinine Clearance: 38 mL/min (A) (by C-G formula based on SCr of 1.17 mg/dL (H)). Liver Function Tests: Recent Labs  Lab 12/24/23 1420 12/25/23 0512 12/29/23 0906  AST 21  --  19  ALT 7  --  6  ALKPHOS 116  --  103  BILITOT 1.2  --  0.3  PROT 6.5  --  6.8  ALBUMIN 3.7 3.4* 3.7   Recent Labs  Lab 12/29/23 0906  LIPASE 29   No results for input(s): AMMONIA in the last 168 hours. Coagulation Profile: Recent Labs  Lab 12/24/23 1420  INR 1.0   Cardiac Enzymes: Recent Labs  Lab 12/26/23 0510  CKTOTAL 45   CBG: Recent Labs  Lab 12/27/23 1125 12/27/23 1639 12/27/23 2155 12/28/23 0747 12/28/23 1109  GLUCAP 111* 111* 112* 120* 149*   Urine analysis:    Component Value Date/Time   COLORURINE YELLOW 12/29/2023 1216   APPEARANCEUR CLEAR 12/29/2023 1216   APPEARANCEUR Hazy (A) 05/23/2020 1119   LABSPEC 1.014 12/29/2023 1216   PHURINE 5.0 12/29/2023 1216   GLUCOSEU 50 (A) 12/29/2023 1216   HGBUR NEGATIVE 12/29/2023 1216   BILIRUBINUR NEGATIVE 12/29/2023 1216   BILIRUBINUR Negative 05/23/2020 1119   KETONESUR 5 (A) 12/29/2023 1216   PROTEINUR 100 (A) 12/29/2023 1216   UROBILINOGEN 0.2 04/25/2014 0936   NITRITE NEGATIVE 12/29/2023 1216   LEUKOCYTESUR NEGATIVE 12/29/2023 1216    Radiological Exams on Admission: CT ABDOMEN PELVIS W CONTRAST Result Date: 12/29/2023 CLINICAL DATA:  Acute abdominal pain and vomiting. 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 EXAM: CT ABDOMEN AND PELVIS WITH CONTRAST TECHNIQUE: Multidetector CT imaging of the abdomen and pelvis was performed using the standard protocol following bolus administration of intravenous contrast. RADIATION DOSE REDUCTION: This exam was performed according to the departmental  dose-optimization program which includes automated exposure control, adjustment of the mA and/or kV according to patient size and/or use of iterative reconstruction technique. CONTRAST:  80mL OMNIPAQUE  IOHEXOL  300 MG/ML  SOLN COMPARISON:  12/21/2023, 11/25/2020 FINDINGS: Lower chest: Mild stable cardiomegaly. Mild calcified plaque over the descending thoracic aorta. Visualized lung bases demonstrate no acute process. Hepatobiliary: Several small liver cysts unchanged. Mild dilatation of the gallbladder with slight ill definition of the wall. No definite cholelithiasis. Biliary tree is otherwise unremarkable. Pancreas: Normal. Spleen: Normal. Adrenals/Urinary Tract: Right adrenal gland is normal. 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 Kidneys are normal in size. Several bilateral renal cysts are present and unchanged. There is a new hypoechoic area over the mid to upper pole lateral left renal cortex measuring approximately 1.6 x 2.7 cm with a smaller similar hypoechoic area over the mid to lower pole left renal cortex. These findings were not present on previous exams as the larger upper pole hypoechoic area is new compared to 12/21/2023. Mild stranding of the left perinephric fat. Findings likely due to pyelonephritis. Ureters and bladder are unremarkable. Stomach/Bowel: Possible small sliding hiatal hernia. Stomach is otherwise unremarkable. Small bowel is normal. Significant diverticulosis throughout the sigmoid colon without active inflammation. Moderate diverticulosis over the descending colon. Subtle thin rimmed oval fatty structure adjacent the lateral aspect of the descending colon without significant change as this can be seen with the people appendagitis. Prior appendectomy. Vascular/Lymphatic: Moderate calcified plaque over the abdominal aorta which is normal in caliber. No adenopathy. Reproductive: Uterus and bilateral adnexa are unremarkable. Other: No  free peritoneal air. Musculoskeletal: Posterior fusion hardware intact from L2-S1. IMPRESSION: 1. New hypoechoic areas over the mid to upper pole and mid to lower pole left renal cortex with mild stranding of the left perinephric fat. Findings likely due to pyelonephritis. 2. Mild dilatation of the gallbladder with slight  ill definition of the wall. No definite cholelithiasis. Recommend right upper quadrant ultrasound if concern for acute cholecystitis. 3. 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 4. Colonic diverticulosis without active inflammation. Findings adjacent the distal descending colon unchanged from 12/21/2023, although new since 10/18/2023 which can be seen with epiploic appendagitis. 5. Aortic atherosclerosis. Aortic Atherosclerosis (ICD10-I70.0). Electronically Signed   By: Toribio Agreste M.D.   On: 12/29/2023 13:35   CT Head Wo Contrast Result Date: 12/29/2023 EXAM: CT Head Without Intravenous Contrast. CLINICAL HISTORY: Altered mental status, nontraumatic (Ped 0-17y); severe hypertension, vomiting. TECHNIQUE: Axial computed tomography images of the head/brain without intravenous contrast. Dose reduction technique was used including one or more of the following: automated exposure control, adjustment of mA and kV according to patient size, and/or iterative reconstruction. CONTRAST: Without. COMPARISON: CT head 07/03/2021. FINDINGS: BRAIN: No acute intraparenchymal hemorrhage. No mass lesion. No CT evidence for acute territorial infarct. No midline shift or extra-axial collection. Mild chronic microvascular ischemic changes and mild age related parenchymal volume loss. 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 Atherosclerosis of the carotid siphons. VENTRICLES: No hydrocephalus. ORBITS: Bilateral lens replacements. SINUSES AND MASTOIDS: Mucosal thickening in the left sphenoid sinus with thickening of the sinus wall  suggestive of mucoperiosteal reaction compatible with chronic sinusitis. The remaining paranasal sinuses and mastoid air cells are clear. SOFT TISSUES: No significant facial or scalp soft tissue swelling evident. No radiopaque foreign body is seen. BONES: No acute skull fracture. IMPRESSION: 1. No acute findings. 2. 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 3. Mild chronic microvascular ischemic changes and mild age-related parenchymal volume loss. 4. Mucosal thickening in the left sphenoid sinus with thickening of the sinus wall and mucoperiosteal reaction compatible with chronic sinusitis. Electronically signed by: Donnice Mania MD 12/29/2023 01:03 PM EST RP Workstation: [id number]   DG Abdomen Acute W/Chest Result Date: 12/29/2023 EXAM: XR Acute Abdomen Series with XR Chest, upright and supine views of the abdomen, frontal view of the chest, 4 Views. CLINICAL HISTORY: abd pain / vomiting COMPARISON: 12/24/2023 FINDINGS: LUNGS: Vascular congestion without overt edema. Pulmonary nodule at the right base with adjacent fiducial marker as characterized by CT on 10/08/2023. No focal airspace consolidation. PLEURAL SPACES: No pleural effusion. No pneumothorax. HEART: Cardiomegaly. MEDIASTINUM: Unremarkable. PERITONEUM: The upright film shows no intraperitoneal free air. BOWEL: No gaseous bowel dilatation to suggest obstruction. BONES: Lumbar spinal fusion hardware evident. No acute osseous abnormality. IMPRESSION: 1. No radiographic evidence of bowel obstruction or perforation. 2. Cardiomegaly with pulmonary vascular congestion, without overt edema. 3. Right basilar pulmonary nodule with adjacent fiducial marker, as previously characterized on CT dated 10/08/2023. Electronically signed by: Camellia Candle MD 12/29/2023 09:39 AM EST RP Workstation: [id number]   US  EKG SITE RITE Result Date: 12/27/2023 If Site Rite image not attached, placement could not be confirmed due to current cardiac  rhythm.  EKG: None.   Assessment/Plan Principal Problem:   Acute cholecystitis Active Problems:   Hypertensive urgency   Essential hypertension, benign   Asthma   DM (diabetes mellitus) (HCC)   S/P TAVR (transcatheter aortic valve replacement)   COPD (chronic obstructive pulmonary disease) (HCC)   Chronic heart failure with preserved ejection fraction (HFpEF) (HCC)   Paroxysmal atrial fibrillation (HCC)   Prosthetic valve endocarditis  Assessment and Plan:  Acute cholecystitis-presented with abdominal pain and vomiting that started today.  She is afebrile.  WBC 10.5.  CT AP  WC-mild stranding in left perinephric fat-likely pyelonephritis.  Mild dilation of the gallbladder with slight ill-definition of wall. Subsequent RUQ ultrasound-suggest acute cholecystitis. - I have consulted general surgeon on-call Dr. Cheree, will see in consult, requested HIDA scan has been ordered. - Clear liquid diet, n.p.o. midnight - IV morphine  2 mg every 4 hourly as needed, no opiates after midnight for HIDA scan - Continue PTA IV ampicillin  and ceftriaxone , add metronidazole  -Zofran  as needed - N/s 75cc/hr x 20hrs  Hypertensive urgency-blood pressures up to 219 systolic.  She is on Norvasc  2.5 mg, carvedilol  6.25 mg, hydralazine  100 mg 3 times daily, Imdur , with vomiting is unlikely she has been able to keep any of the meds down.  - IV hydralazine  10 mg every 2 hourly as needed for systolic greater than 170 -Hold home medications with active vomiting - Admit to stepdown, if blood pressure remains significantly elevated, will consider Cardene drip.  Enterococcal bacteremia/presumptive prosthetic valve endocarditis-TTE was without vegetation, TTE-deferred due to age, pulmonary function and obesity.  Was to complete 6-week course of IV ampicillin  infusion, and IV ceftriaxone , anticipated stop date-02/05/2023. - Continue IV ampicillin -pharmacy to dose, and ceftriaxone   Acute pyelonephritis on imaging-  was just treated for E. coli UTI during recent hospitalization.  UA today not suggestive of UTI. ??Imaging findings lagging behind treatment. - Obtain urine cultures  Paroxysmal atrial fibrillation-not on anticoagulation due to history of GI bleed. - Beta-blocker held for now due to vomiting.    [id number] for severe aortic stenosis.  Asthma/COPD-stable. - Alb nebs as needed - Home regimen   DVT prophylaxis: SCDS for now pending surg eval Code Status: FULL Family Communication: None at bedside Disposition Plan: ~ 2 days Consults called: Gen Surg Admission status: Inpt Stepdown I certify that at the point of admission it is my clinical judgment that the patient will require inpatient hospital care spanning beyond 2 midnights from the point of admission due to high intensity of service, high risk for further deterioration and high frequency of surveillance required.   Author: Tully FORBES Carwin, MD 12/29/2023 6:36 PM  For on call review [email or web address].      [1]  Allergies Allergen Reactions   Nitrofurantoin Nausea And Vomiting   Sulfa Antibiotics Swelling    Lip swelling

## 2023-12-29 NOTE — ED Notes (Signed)
 [place] 2L 02 placed after morphine  given due to sats 89/90% ra. Nad.

## 2023-12-29 NOTE — ED Notes (Signed)
 Awaiting phenergen and ampicillin 

## 2023-12-30 ENCOUNTER — Encounter (HOSPITAL_COMMUNITY): Payer: Self-pay | Admitting: Internal Medicine

## 2023-12-30 ENCOUNTER — Inpatient Hospital Stay (HOSPITAL_COMMUNITY)

## 2023-12-30 ENCOUNTER — Inpatient Hospital Stay (HOSPITAL_COMMUNITY): Admitting: Anesthesiology

## 2023-12-30 ENCOUNTER — Encounter (HOSPITAL_COMMUNITY): Admission: EM | Disposition: A | Payer: Self-pay | Source: Home / Self Care | Attending: Hospitalist

## 2023-12-30 ENCOUNTER — Telehealth (HOSPITAL_COMMUNITY): Payer: Self-pay | Admitting: Pharmacy Technician

## 2023-12-30 ENCOUNTER — Other Ambulatory Visit (HOSPITAL_COMMUNITY): Payer: Self-pay

## 2023-12-30 DIAGNOSIS — K810 Acute cholecystitis: Secondary | ICD-10-CM

## 2023-12-30 DIAGNOSIS — I10 Essential (primary) hypertension: Secondary | ICD-10-CM

## 2023-12-30 DIAGNOSIS — Z87891 Personal history of nicotine dependence: Secondary | ICD-10-CM

## 2023-12-30 LAB — BASIC METABOLIC PANEL WITH GFR
Anion gap: 8 (ref 5–15)
BUN: 11 mg/dL (ref 8–23)
CO2: 28 mmol/L (ref 22–32)
Calcium: 8.9 mg/dL (ref 8.9–10.3)
Chloride: 101 mmol/L (ref 98–111)
Creatinine, Ser: 1.25 mg/dL — ABNORMAL HIGH (ref 0.44–1.00)
GFR, Estimated: 42 mL/min — ABNORMAL LOW (ref >60)
Glucose, Bld: 109 mg/dL — ABNORMAL HIGH (ref 70–99)
Potassium: 4.1 mmol/L (ref 3.5–5.1)
Sodium: 137 mmol/L (ref 135–145)

## 2023-12-30 LAB — CBC
HCT: 34.5 % — ABNORMAL LOW (ref 36.0–46.0)
Hemoglobin: 11.4 g/dL — ABNORMAL LOW (ref 12.0–15.0)
MCH: 28.3 pg (ref 26.0–34.0)
MCHC: 33 g/dL (ref 30.0–36.0)
MCV: 85.6 fL (ref 80.0–100.0)
Platelets: 176 K/uL (ref 150–400)
RBC: 4.03 MIL/uL (ref 3.87–5.11)
RDW: 15.8 % — ABNORMAL HIGH (ref 11.5–15.5)
WBC: 22 K/uL — ABNORMAL HIGH (ref 4.0–10.5)
nRBC: 0 % (ref 0.0–0.2)

## 2023-12-30 LAB — CULTURE, BLOOD (ROUTINE X 2)
Culture: NO GROWTH
Culture: NO GROWTH
Special Requests: ADEQUATE
Special Requests: ADEQUATE

## 2023-12-30 LAB — CBG MONITORING, ED
Glucose-Capillary: 125 mg/dL — ABNORMAL HIGH (ref 70–99)
Glucose-Capillary: 135 mg/dL — ABNORMAL HIGH (ref 70–99)

## 2023-12-30 LAB — HEPATIC FUNCTION PANEL
ALT: 5 U/L (ref 0–44)
AST: 14 U/L — ABNORMAL LOW (ref 15–41)
Albumin: 3.5 g/dL (ref 3.5–5.0)
Alkaline Phosphatase: 91 U/L (ref 38–126)
Bilirubin, Direct: 0.2 mg/dL (ref 0.0–0.2)
Indirect Bilirubin: 0.2 mg/dL — ABNORMAL LOW (ref 0.3–0.9)
Total Bilirubin: 0.4 mg/dL (ref 0.0–1.2)
Total Protein: 6.4 g/dL — ABNORMAL LOW (ref 6.5–8.1)

## 2023-12-30 LAB — GLUCOSE, CAPILLARY
Glucose-Capillary: 103 mg/dL — ABNORMAL HIGH (ref 70–99)
Glucose-Capillary: 112 mg/dL — ABNORMAL HIGH (ref 70–99)
Glucose-Capillary: 108 mg/dL — ABNORMAL HIGH (ref 70–99)
Glucose-Capillary: 102 mg/dL — ABNORMAL HIGH (ref 70–99)

## 2023-12-30 SURGERY — CHOLECYSTECTOMY, ROBOT-ASSISTED, LAPAROSCOPIC
Anesthesia: General | Site: Abdomen

## 2023-12-30 MED ORDER — OXYCODONE HCL 5 MG PO TABS: 5.0000 mg | ORAL_TABLET | Freq: Once | ORAL | Status: DC | PRN

## 2023-12-30 MED ORDER — ACETAMINOPHEN 500 MG PO TABS
1000.0000 mg | ORAL_TABLET | Freq: Four times a day (QID) | ORAL | Status: DC
  Administered 2023-12-30 – 2024-01-04 (×15): 1000 mg via ORAL
  Filled 2023-12-30 (×17): qty 2

## 2023-12-30 MED ORDER — CEFTRIAXONE IV (FOR PTA / DISCHARGE USE ONLY): 2.0000 g | Freq: Two times a day (BID) | INTRAVENOUS | 0 refills | Status: DC

## 2023-12-30 MED ORDER — HYDRALAZINE HCL 50 MG PO TABS
100.0000 mg | ORAL_TABLET | Freq: Three times a day (TID) | ORAL | Status: DC
  Administered 2023-12-30 – 2023-12-31 (×3): 100 mg via ORAL
  Filled 2023-12-30 (×3): qty 2

## 2023-12-30 MED ORDER — ACETAMINOPHEN 650 MG RE SUPP: 650.0000 mg | Freq: Four times a day (QID) | RECTAL | Status: DC | PRN

## 2023-12-30 MED ORDER — PROPOFOL 10 MG/ML IV BOLUS
INTRAVENOUS | Status: AC
  Filled 2023-12-30: qty 20

## 2023-12-30 MED ORDER — EPHEDRINE SULFATE (PRESSORS) 25 MG/5ML IV SOSY
PREFILLED_SYRINGE | INTRAVENOUS | Status: DC | PRN
  Administered 2023-12-30 (×2): 5 mg via INTRAVENOUS

## 2023-12-30 MED ORDER — ISOSORBIDE MONONITRATE ER 30 MG PO TB24
30.0000 mg | ORAL_TABLET | Freq: Every day | ORAL | Status: DC
  Administered 2023-12-30 – 2024-01-04 (×6): 30 mg via ORAL
  Filled 2023-12-30 (×6): qty 1

## 2023-12-30 MED ORDER — INDOCYANINE GREEN 25 MG IJ SOLR
INTRAMUSCULAR | Status: AC
  Administered 2023-12-30: 2.5 mg via INTRAVENOUS
  Filled 2023-12-30: qty 10

## 2023-12-30 MED ORDER — PANTOPRAZOLE SODIUM 40 MG PO TBEC
40.0000 mg | DELAYED_RELEASE_TABLET | Freq: Every day | ORAL | Status: DC
  Administered 2023-12-30 – 2024-01-04 (×6): 40 mg via ORAL
  Filled 2023-12-30 (×6): qty 1

## 2023-12-30 MED ORDER — FENTANYL CITRATE (PF) 250 MCG/5ML IJ SOLN
INTRAMUSCULAR | Status: DC | PRN
  Administered 2023-12-30 (×3): 50 ug via INTRAVENOUS

## 2023-12-30 MED ORDER — MORPHINE SULFATE (PF) 4 MG/ML IV SOLN: 3.0000 mg | Freq: Once | INTRAVENOUS | Status: AC

## 2023-12-30 MED ORDER — ONDANSETRON HCL 4 MG/2ML IJ SOLN: 4.0000 mg | Freq: Once | INTRAMUSCULAR | Status: DC | PRN

## 2023-12-30 MED ORDER — CHLORHEXIDINE GLUCONATE CLOTH 2 % EX PADS
6.0000 | MEDICATED_PAD | Freq: Every day | CUTANEOUS | Status: DC
  Administered 2023-12-30 – 2024-01-04 (×6): 6 via TOPICAL

## 2023-12-30 MED ORDER — CARVEDILOL 3.125 MG PO TABS
6.2500 mg | ORAL_TABLET | Freq: Two times a day (BID) | ORAL | Status: DC
  Administered 2023-12-30 – 2023-12-31 (×2): 6.25 mg via ORAL
  Filled 2023-12-30 (×2): qty 2

## 2023-12-30 MED ORDER — AMPICILLIN IV (FOR PTA / DISCHARGE USE ONLY): 8.0000 g | INTRAVENOUS | 0 refills | Status: DC

## 2023-12-30 MED ORDER — UMECLIDINIUM BROMIDE 62.5 MCG/ACT IN AEPB
1.0000 | INHALATION_SPRAY | Freq: Every day | RESPIRATORY_TRACT | Status: DC
  Administered 2023-12-31 – 2024-01-04 (×5): 1 via RESPIRATORY_TRACT
  Filled 2023-12-30: qty 7

## 2023-12-30 MED ORDER — POLYETHYLENE GLYCOL 3350 17 G PO PACK: 17.0000 g | PACK | Freq: Every day | ORAL | Status: DC | PRN

## 2023-12-30 MED ORDER — OXYCODONE HCL 5 MG/5ML PO SOLN: 5.0000 mg | Freq: Once | ORAL | Status: DC | PRN

## 2023-12-30 MED ORDER — ARFORMOTEROL TARTRATE 15 MCG/2ML IN NEBU
15.0000 ug | INHALATION_SOLUTION | Freq: Two times a day (BID) | RESPIRATORY_TRACT | Status: DC
  Administered 2023-12-30 – 2024-01-04 (×10): 15 ug via RESPIRATORY_TRACT
  Filled 2023-12-30 (×11): qty 2

## 2023-12-30 MED ORDER — BUPIVACAINE HCL (PF) 0.5 % IJ SOLN
INTRAMUSCULAR | Status: AC
  Filled 2023-12-30: qty 30

## 2023-12-30 MED ORDER — ALBUTEROL SULFATE (2.5 MG/3ML) 0.083% IN NEBU
2.5000 mg | INHALATION_SOLUTION | RESPIRATORY_TRACT | Status: DC | PRN
  Administered 2024-01-04: 2.5 mg via RESPIRATORY_TRACT
  Filled 2023-12-30: qty 3

## 2023-12-30 MED ORDER — INSULIN ASPART 100 UNIT/ML IJ SOLN
0.0000 [IU] | INTRAMUSCULAR | Status: DC
  Administered 2023-12-30 – 2024-01-04 (×8): 1 [IU] via SUBCUTANEOUS
  Administered 2024-01-04: 12:00:00 2 [IU] via SUBCUTANEOUS
  Administered 2024-01-04: 08:00:00 1 [IU] via SUBCUTANEOUS
  Filled 2023-12-30 (×10): qty 1

## 2023-12-30 MED ORDER — SODIUM CHLORIDE 0.9 % IV SOLN
INTRAVENOUS | Status: AC
  Filled 2023-12-30: qty 20

## 2023-12-30 MED ORDER — ROCURONIUM BROMIDE 10 MG/ML (PF) SYRINGE
PREFILLED_SYRINGE | INTRAVENOUS | Status: DC | PRN
  Administered 2023-12-30: 70 mg via INTRAVENOUS
  Administered 2023-12-30 (×2): 10 mg via INTRAVENOUS

## 2023-12-30 MED ORDER — ONDANSETRON HCL 4 MG/2ML IJ SOLN
INTRAMUSCULAR | Status: DC | PRN
  Administered 2023-12-30: 4 mg via INTRAVENOUS

## 2023-12-30 MED ORDER — SODIUM CHLORIDE 0.9 % IV SOLN: INTRAVENOUS | Status: DC

## 2023-12-30 MED ORDER — OXYCODONE HCL 5 MG PO TABS
5.0000 mg | ORAL_TABLET | ORAL | Status: DC | PRN
  Administered 2023-12-30 – 2024-01-02 (×2): 5 mg via ORAL
  Filled 2023-12-30 (×2): qty 1

## 2023-12-30 MED ORDER — PROPOFOL 500 MG/50ML IV EMUL
INTRAVENOUS | Status: DC | PRN
  Administered 2023-12-30: 70 mg via INTRAVENOUS

## 2023-12-30 MED ORDER — TECHNETIUM TC 99M MEBROFENIN IV KIT
5.0000 | PACK | Freq: Once | INTRAVENOUS | Status: AC | PRN
  Administered 2023-12-30: 5 via INTRAVENOUS

## 2023-12-30 MED ORDER — LIDOCAINE 2% (20 MG/ML) 5 ML SYRINGE
INTRAMUSCULAR | Status: DC | PRN
  Administered 2023-12-30: 60 mg via INTRAVENOUS

## 2023-12-30 MED ORDER — ORAL CARE MOUTH RINSE: 15.0000 mL | OROMUCOSAL | Status: DC | PRN

## 2023-12-30 MED ORDER — MORPHINE SULFATE (PF) 4 MG/ML IV SOLN
INTRAVENOUS | Status: AC
  Administered 2023-12-30: 3 mg via INTRAVENOUS
  Filled 2023-12-30: qty 1

## 2023-12-30 MED ORDER — FENTANYL CITRATE (PF) 50 MCG/ML IJ SOSY: 25.0000 ug | PREFILLED_SYRINGE | INTRAMUSCULAR | Status: DC | PRN

## 2023-12-30 MED ORDER — AMLODIPINE BESYLATE 5 MG PO TABS
2.5000 mg | ORAL_TABLET | Freq: Every day | ORAL | Status: DC
  Administered 2023-12-30 – 2023-12-31 (×2): 2.5 mg via ORAL
  Filled 2023-12-30 (×2): qty 1

## 2023-12-30 MED ORDER — GABAPENTIN 100 MG PO CAPS
100.0000 mg | ORAL_CAPSULE | Freq: Every day | ORAL | Status: DC
  Administered 2023-12-30: 200 mg via ORAL
  Administered 2023-12-31: 100 mg via ORAL
  Filled 2023-12-30: qty 1
  Filled 2023-12-30: qty 2

## 2023-12-30 MED ORDER — INDOCYANINE GREEN 25 MG IJ SOLR: 2.5000 mg | Freq: Once | INTRAMUSCULAR | Status: AC

## 2023-12-30 MED ORDER — SUGAMMADEX SODIUM 200 MG/2ML IV SOLN
INTRAVENOUS | Status: DC | PRN
  Administered 2023-12-30: 200 mg via INTRAVENOUS

## 2023-12-30 MED ORDER — ACETAMINOPHEN 325 MG PO TABS: 650.0000 mg | ORAL_TABLET | Freq: Four times a day (QID) | ORAL | Status: DC | PRN

## 2023-12-30 MED ORDER — PHENYLEPHRINE 80 MCG/ML (10ML) SYRINGE FOR IV PUSH (FOR BLOOD PRESSURE SUPPORT)
PREFILLED_SYRINGE | INTRAVENOUS | Status: DC | PRN
  Administered 2023-12-30 (×2): 160 ug via INTRAVENOUS
  Administered 2023-12-30: 80 ug via INTRAVENOUS
  Administered 2023-12-30 (×2): 160 ug via INTRAVENOUS

## 2023-12-30 MED ORDER — FENTANYL CITRATE (PF) 250 MCG/5ML IJ SOLN
INTRAMUSCULAR | Status: AC
  Filled 2023-12-30: qty 5

## 2023-12-30 MED ORDER — BUPIVACAINE HCL (PF) 0.5 % IJ SOLN
INTRAMUSCULAR | Status: DC | PRN
  Administered 2023-12-30: 30 mL

## 2023-12-30 MED ORDER — STERILE WATER FOR IRRIGATION IR SOLN
Status: DC | PRN
  Administered 2023-12-30: 1000 mL

## 2023-12-30 MED ORDER — MORPHINE SULFATE (PF) 2 MG/ML IV SOLN: 2.0000 mg | INTRAVENOUS | Status: DC | PRN

## 2023-12-30 MED ADMIN — Sodium Chloride Irrigation Soln 0.9%: 3000 mL | NDC 00338004724

## 2023-12-30 MED ADMIN — HEMOSTATIC AGENTS (NO CHARGE) OPTIME: 1 | TOPICAL | NDC 99999080054

## 2023-12-30 MED FILL — Lidocaine HCl Local Soln Prefilled Syringe 100 MG/5ML (2%): INTRAMUSCULAR | Qty: 5 | Status: AC

## 2023-12-30 MED FILL — henylephrine-NaCl Pref Syr 0.8 MG/10ML-0.9% (80 MCG/ML): INTRAVENOUS | Qty: 10 | Status: AC

## 2023-12-30 MED FILL — Ephedrine Sulfate Prefilled Syringe 25 MG/5ML (5 MG/ML): INTRAVENOUS | Qty: 5 | Status: AC

## 2023-12-30 MED FILL — Rocuronium Bromide IV Soln Pref Syr 100 MG/10ML (10 MG/ML): INTRAVENOUS | Qty: 10 | Status: AC

## 2023-12-30 MED FILL — Ondansetron HCl Inj 4 MG/2ML (2 MG/ML): INTRAMUSCULAR | Qty: 2 | Status: AC

## 2023-12-30 MED FILL — Sugammadex Sodium IV 200 MG/2ML (Base Equivalent): INTRAVENOUS | Qty: 2 | Status: AC

## 2023-12-30 SURGICAL SUPPLY — 38 items
BLADE SURG 15 STRL LF DISP TIS (BLADE) ×1 IMPLANT
CAUTERY HOOK MNPLR 1.6 DVNC XI (INSTRUMENTS) ×1 IMPLANT
CHLORAPREP W/TINT 26 (MISCELLANEOUS) ×1 IMPLANT
CLIP LIGATING HEM O LOK PURPLE (MISCELLANEOUS) ×1 IMPLANT
DEFOGGER SCOPE WARM SEASHARP (MISCELLANEOUS) ×1 IMPLANT
DERMABOND ADVANCED .7 DNX12 (GAUZE/BANDAGES/DRESSINGS) ×1 IMPLANT
DRAPE ARM DVNC X/XI (DISPOSABLE) ×4 IMPLANT
DRAPE COLUMN DVNC XI (DISPOSABLE) ×1 IMPLANT
ELECTRODE REM PT RTRN 9FT ADLT (ELECTROSURGICAL) ×1 IMPLANT
FORCEPS BPLR R/ABLATION 8 DVNC (INSTRUMENTS) ×1 IMPLANT
FORCEPS PROGRASP DVNC XI (FORCEP) ×1 IMPLANT
GLOVE BIOGEL PI IND STRL 6.5 (GLOVE) ×2 IMPLANT
GLOVE BIOGEL PI IND STRL 7.0 (GLOVE) ×3 IMPLANT
GLOVE SURG SS PI 6.5 STRL IVOR (GLOVE) ×2 IMPLANT
GOWN STRL REUS W/TWL LRG LVL3 (GOWN DISPOSABLE) ×3 IMPLANT
GRASPER SUT TROCAR 14GX15 (MISCELLANEOUS) ×1 IMPLANT
HEMOSTAT SNOW SURGICEL 2X4 (HEMOSTASIS) IMPLANT
IRRIGATOR SUCT 8 DISP DVNC XI (IRRIGATION / IRRIGATOR) IMPLANT
KIT TURNOVER KIT A (KITS) ×1 IMPLANT
MANIFOLD NEPTUNE II (INSTRUMENTS) ×1 IMPLANT
NDL HYPO 21X1.5 SAFETY (NEEDLE) ×1 IMPLANT
NDL INSUFFLATION 14GA 120MM (NEEDLE) ×1 IMPLANT
NEEDLE HYPO 21X1.5 SAFETY (NEEDLE) ×1 IMPLANT
NEEDLE INSUFFLATION 14GA 120MM (NEEDLE) ×1 IMPLANT
OBTURATOR OPTICALSTD 8 DVNC (TROCAR) ×1 IMPLANT
PACK LAP CHOLE LZT030E (CUSTOM PROCEDURE TRAY) ×1 IMPLANT
PAD ARMBOARD POSITIONER FOAM (MISCELLANEOUS) ×1 IMPLANT
PENCIL HANDSWITCHING (ELECTRODE) ×1 IMPLANT
POSITIONER HEAD 8X9X4 ADT (SOFTGOODS) ×1 IMPLANT
SEAL UNIV 5-12 XI (MISCELLANEOUS) ×4 IMPLANT
SET BASIN LINEN APH (SET/KITS/TRAYS/PACK) ×1 IMPLANT
SET TUBE DA VINCI INSUFFLATOR (TUBING) IMPLANT
SOL .9 NS 3000ML IRR UROMATIC (IV SOLUTION) IMPLANT
SUT MNCRL AB 4-0 PS2 18 (SUTURE) ×2 IMPLANT
SUT VICRYL 0 UR6 27IN ABS (SUTURE) ×1 IMPLANT
SYR 30ML LL (SYRINGE) ×1 IMPLANT
SYSTEM RETRIEVL 5MM INZII UNIV (BASKET) ×1 IMPLANT
WATER STERILE IRR 500ML POUR (IV SOLUTION) ×1 IMPLANT

## 2023-12-30 NOTE — Transfer of Care (Addendum)
 Immediate Anesthesia Transfer of Care Note  Patient: Stephanie [name]  Procedure(s) Performed: CHOLECYSTECTOMY, ROBOT-ASSISTED, LAPAROSCOPIC (Abdomen)  Patient Location: PACU  Anesthesia Type:General  Level of Consciousness: awake  Airway & Oxygen  Therapy: Patient Spontanous Breathing and Patient connected to nasal cannula oxygen   Post-op Assessment: Report given to RN and Post -op Vital signs reviewed and stable  Post vital signs: Reviewed and stable  Last Vitals:  Vitals Value Taken Time  BP 134/66 12/30/23 12:30  Temp 98.9   Pulse 71 12/30/23 12:30  Resp 21 12/30/23 12:31  SpO2 98 % 12/30/23 12:30  Vitals shown include unfiled device data.  Last Pain:  Vitals:   12/30/23 0951  TempSrc: Oral  PainSc: 0-No pain         Complications: No notable events documented.

## 2023-12-30 NOTE — Op Note (Signed)
 [facility] Operative Note  12/30/2023  Preoperative Diagnosis: Acute cholecystitis   Postoperative Diagnosis: Same   Procedure(s) Performed: Robotic Assisted Laparoscopic Cholecystectomy   Surgeon: Dorothyann Brittle, DO   Assistants: No qualified resident was available    Anesthesia: General endotracheal   Anesthesiologist: Kendell Yvonna PARAS, MD    Specimens: Gallbladder   Estimated Blood Loss: Minimal   Blood Replacement: None    Complications: None   Wound Class: Contaminated   Operative Indications: The patient was admitted to the hospital with concern for acute cholecystitis.  She initially underwent a CT which demonstrated a distended gallbladder, and then US  demonstrated dilation of the gallbladder with minimal wall thickening and trace pericholecystic fluid.  HIDA demonstrated no visible gallbladder, consistent with acute cholecystitis.  We discussed the risk of the procedure including but not limited to bleeding, infection, injury to the common bile duct, bile leak, need for further procedures, chance of subtotal cholecystectomy.   Findings:  Significantly distended and inflamed gallbladder, consistent with acute cholecystitis Critical view of safety noted All clips intact at the end of the case Adequate hemostasis   Procedure: Firefly was given in the preoperative area. The patient was taken to the operating room and placed supine. General endotracheal anesthesia was induced.Patient has been receiving IV antibiotics on the floor.  An orogastric tube positioned to decompress the stomach. The abdomen was prepared and draped in the usual sterile fashion. A time-out was completed verifying correct patient, procedure, site, positioning, and implant(s) and/or special equipment prior to beginning this procedure.  Veress needle was placed at the infraumbilical area and insufflation was started after confirming a positive saline drop test and no immediate  increase in abdominal pressure.  After reaching 15 mm, the Veress needle was removed and a 8 mm port was placed via optiview technique infraumbilical, measuring 20 mm away from the suspected position of the gallbladder.  The abdomen was inspected and no abnormalities or injuries were found.  Under direct vision, ports were placed in the following locations in a semi curvilinear position around the target of the gallbladder: Two 8 mm ports on the patient's right each having 8cm clearance to the adjacent ports and one 8 mm port placed on the patient's left 8 cm from the umbilical port. Once ports were placed, the table was placed in the reverse Trendelenburg position with the right side up. The Xi platform was brought into the operative field and docked to the ports successfully.  An endoscope was placed through the umbilical port, prograsp through the most lateral right port, fenestrated bipolar to the port just right of the umbilicus, and then a hook cautery in the left port.  The gallbladder was noted to be significantly distended and inflamed, so decision was made to first decompress the gallbladder.  The dome of the gallbladder was then grasped with prograsp and retracted over the dome of the liver. Adhesions between the gallbladder and omentum, duodenum and transverse colon were lysed via hook cautery. The infundibulum was grasped with the fenestrated grasper and retracted toward the right lower quadrant. This maneuver exposed Calots triangle. Firefly was used throughout the dissection to ensure safe visualization of the cystic duct.  The peritoneum overlying the gallbladder infundibulum was then dissected and the cystic duct and cystic artery identified.  Critical view of safety with the liver bed clearly visible behind the duct and artery with no additional structures noted.  The cystic duct was doubly clipped and divided close to the gallbladder.  The area with the suspected cystic artery was also  clipped.  The gallbladder was then dissected from its peritoneal and liver bed attachments by electrocautery.  The gallbladder fossa and liver were irrigated with saline.  Hemostasis was checked prior to removing the hook cautery.  Surgical SNOW was placed in the gallbladder fossa.  The Anton was undocked and moved out of the field.  A 5mm Endo Catch bag was then placed through the umbilical port and the gallbladder was removed.  The gallbladder was passed off the table as a specimen. There was no evidence of bleeding from the gallbladder fossa or cystic artery or leakage of the bile from the cystic duct stump. The umbilical port site closed with a 0 vicryl with a PMI needle.  The abdomen was desufflated and secondary trocars were removed under direct vision. No bleeding was noted. Incisions were localized with marcaine .  All skin incisions were closed with subcuticular sutures of 4-0 monocryl and dermabond.   Final inspection revealed acceptable hemostasis. All counts were correct at the end of the case. The patient was awakened from anesthesia and extubated without complication. The OG tube was removed.  The patient went to the PACU in stable condition.   Dorothyann Brittle, DO [facility] [address] [place], [place] [address] [phone] (office)

## 2023-12-30 NOTE — ED Notes (Signed)
PT is off the floor

## 2023-12-30 NOTE — Consult Note (Addendum)
 [facility] Consult  Reason for Consult: acute cholecystitis Referring Physician: Dr. Valli  Chief Complaint   Nausea     HPI: Stephanie [name] is a 88 y.o. female who was admitted to the hospital with concern for acute cholecystitis.  She was recently admitted to the hospital from 12/6 to 12/10 for sepsis secondary to enterococcal bacteremia, but at that time the source of infection was unclear.  She did have urine cultures positive for E. coli.  She was treated for presumptive prosthetic valve endocarditis and was discharged home with a PICC line and plan for IV antibiotics for 6 weeks.  Once being discharged home, she started having worsening nausea and vomiting.  She did have increased abdominal pain at this time.  She has a past medical history significant for CKD, COPD, hypertension, hyperlipidemia, diabetes, and previous aortic stenosis status post TAVR.  Patient denies any history of abdominal surgeries, but she does have a scar on her abdomen and EMR states that she has a history of appendectomy.  In the emergency department, she was noted to be hemodynamically stable.  She underwent a CT of the abdomen and pelvis which demonstrated mild gallbladder dilation with a recommendation of a right upper quadrant ultrasound and left pyelonephritis.  She subsequently underwent a ultrasound which demonstrated mild gallbladder dilation with minimal wall thickening and a small amount of pericholecystic fluid but a positive Murphy sign, with findings possibly being seen in the setting of acute cholecystitis.  She underwent a HIDA scan this morning, and on my evaluation of the imaging, I do not see the gallbladder, even after morphine  administration.  Patient currently denies abdominal pain.  She is asking for water.  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 Wonda and Dr. Lucas.   Severe aortic stenosis    Symptomatic anemia    Presumed GI bleed January 2021 - she declined GI work-up   Type 2 diabetes mellitus [facility])     Past Surgical History:  Procedure Laterality Date   APPENDECTOMY     BACK SURGERY     BRONCHIAL BIOPSY  03/02/2022   Procedure: BRONCHIAL BIOPSIES;  Surgeon: Brenna Adine LITTIE, DO;  Location: [facility] ENDOS[facility];  Service: Pulmonary;;   BRONCHIAL BRUSHINGS  03/02/2022   Procedure: BRONCHIAL BRUSHINGS;  Surgeon: Brenna Adine LITTIE, DO;  Location: [facility];  Service: Pulmonary;;   BRONCHIAL NEEDLE ASPIRATION BIOPSY  03/02/2022   Procedure: BRONCHIAL NEEDLE ASPIRATION BIOPSIES;  Surgeon: Brenna Adine LITTIE, DO;  Location: [facility] ENDOSCOPY;  Service: Pulmonary;;   CYST REMOVAL HAND     Right, hospital in [place]   FIDUCIAL MARKER PLACEMENT  03/02/2022   Procedure: FIDUCIAL MARKER PLACEMENT;  Surgeon: Brenna Adine LITTIE, DO;  Location: [facility]OS[facility];  Service: Pulmonary;;   INTRAOPERATIVE TRANSTHORACIC ECHOCARDIOGRAM Left 07/01/2020   Procedure: INTRAOPERATIVE TRANSTHORACIC ECHOCARDIOGRAM;  Surgeon: Wonda Sharper, MD;  Location: [facility] OR;  Service: Open Heart Surgery;  Laterality: Left;   KNEE ARTHROSCOPY WITH MEDIAL MENISECTOMY Right 02/28/2012   Procedure: KNEE ARTHROSCOPY WITH MEDIAL MENISECTOMY;  Surgeon: Taft FORBES Minerva, MD;  Location: [facility]S;  Service: Orthopedics;  Laterality: Right;   REPAIR KNEE LIGAMENT     RIGHT/LEFT HEART CATH AND CORONARY ANGIOGRAPHY N/A 04/10/2020   Procedure: RIGHT/LEFT HEART CATH AND CORONARY ANGIOGRAPHY;  Surgeon: Dann Candyce RAMAN, MD;  Location: [facility] INVASIVE CV LAB;  Service: Cardiovascular;  Laterality: N/A;   TRANSCATHETER AORTIC VALVE REPLACEMENT, TRANSFEMORAL N/A 07/01/2020   Procedure:  TRANSCATHETER AORTIC VALVE REPLACEMENT, TRANSFEMORAL;  Surgeon: Wonda Sharper, MD;  Location: [facility] OR;  Service: Open Heart Surgery;  Laterality: N/A;   ULTRASOUND GUIDANCE FOR VASCULAR ACCESS Bilateral 07/01/2020   Procedure: ULTRASOUND GUIDANCE FOR VASCULAR ACCESS;  Surgeon: Wonda Sharper, MD;  Location: [facility] OR;  Service: Open Heart Surgery;  Laterality: Bilateral;    Family History  Problem Relation Age of Onset   Lung disease Other    Cancer Other    Arthritis Other    Asthma Other    Diabetes Other    Diabetes Mother    Hypertension Mother    Diverticulitis Mother    Diabetes Father    Hypertension Sister    Diabetes Brother    Colon cancer Neg Hx     Social History[1]  Medications: I have reviewed the patient's current medications.  Allergies[2]   ROS:  Pertinent items are noted in HPI.  Blood pressure (!) 144/61, pulse 85, temperature 98 F (36.7 C), temperature source Oral, resp. rate 20, height 5' 5 (1.651 m), weight 89 kg, SpO2 100%. Physical Exam Vitals reviewed.  Constitutional:      Appearance: Normal appearance.  HENT:     Head: Normocephalic and atraumatic.  Eyes:     Extraocular Movements: Extraocular movements intact.     Pupils: Pupils are equal, round, and reactive to light.  Cardiovascular:     Rate and Rhythm: Normal rate.  Pulmonary:     Effort: Pulmonary effort is normal.  Abdominal:     Comments: Abdomen soft, mild distention, no percussion tenderness, minimal right upper quadrant tenderness to palpation; no rigidity, guarding, rebound tenderness; negative Murphy sign  Musculoskeletal:        General: Normal range of motion.     Cervical back: Normal range of motion.  Skin:    General: Skin is warm and dry.  Neurological:     General: No focal deficit present.     Mental Status: She is alert.  Psychiatric:        Mood and Affect: Mood normal.        Behavior: Behavior normal.     Results: Results for orders placed or performed  during the hospital encounter of 12/29/23 (from the past 48 hours)  CBC with Differential     Status: Abnormal   Collection Time: 12/29/23  9:06 AM  Result Value Ref Range   WBC 10.5 4.0 - 10.5 K/uL   RBC 4.48 3.87 - 5.11 MIL/uL   Hemoglobin 12.5 12.0 - 15.0 g/dL   HCT 61.4 63.9 - 53.9 %   MCV 85.9 80.0 - 100.0 fL   MCH 27.9 26.0 - 34.0 pg   MCHC 32.5 30.0 - 36.0 g/dL   RDW 84.2 (H) 88.4 - 84.4 %   Platelets 154 150 - 400 K/uL    Comment: SPECIMEN CHECKED FOR CLOTS REPEATED TO VERIFY PLATELET COUNT CONFIRMED BY SMEAR    nRBC 0.0 0.0 - 0.2 %   Neutrophils Relative % 84 %   Neutro Abs 8.7 (H) 1.7 - 7.7 K/uL   Lymphocytes Relative 6 %   Lymphs Abs 0.7 0.7 - 4.0 K/uL  Monocytes Relative 7 %   Monocytes Absolute 0.7 0.1 - 1.0 K/uL   Eosinophils Relative 2 %   Eosinophils Absolute 0.2 0.0 - 0.5 K/uL   Basophils Relative 0 %   Basophils Absolute 0.0 0.0 - 0.1 K/uL   Smear Review See Note     Comment: PLATELET COUNT CONFIRMED BY SMEAR Reviewed    Immature Granulocytes 1 %   Abs Immature Granulocytes 0.12 (H) 0.00 - 0.07 K/uL   Large Granular Lymphocytes PRESENT    Reactive, Benign Lymphocytes PRESENT    Ovalocytes PRESENT     Comment: Performed at [facility], [address] [place], [place] [address]  Comprehensive metabolic panel     Status: Abnormal   Collection Time: 12/29/23  9:06 AM  Result Value Ref Range   Sodium 134 (L) 135 - 145 mmol/L   Potassium 4.4 3.5 - 5.1 mmol/L   Chloride 97 (L) 98 - 111 mmol/L   CO2 22 22 - 32 mmol/L   Glucose, Bld 157 (H) 70 - 99 mg/dL    Comment: Glucose reference range applies only to samples taken after fasting for at least 8 hours.   BUN 11 8 - 23 mg/dL   Creatinine, Ser 8.82 (H) 0.44 - 1.00 mg/dL   Calcium  9.1 8.9 - 10.3 mg/dL   Total Protein 6.8 6.5 - 8.1 g/dL   Albumin 3.7 3.5 - 5.0 g/dL   AST 19 15 - 41 U/L   ALT 6 0 - 44 U/L   Alkaline Phosphatase 103 38 - 126 U/L   Total Bilirubin 0.3 0.0 - 1.2 mg/dL   GFR, Estimated 45  (L) >60 mL/min    Comment: (NOTE) Calculated using the CKD-EPI Creatinine Equation (2021)    Anion gap 15 5 - 15    Comment: Performed at [facility], [address] [place], [place] [address]  Lipase, blood     Status: None   Collection Time: 12/29/23  9:06 AM  Result Value Ref Range   Lipase 29 11 - 51 U/L    Comment: Performed at [facility], [address] [place], [place] [address]  Urinalysis, Routine w reflex microscopic -Urine, Clean Catch     Status: Abnormal   Collection Time: 12/29/23 12:16 PM  Result Value Ref Range   Color, Urine YELLOW YELLOW   APPearance CLEAR CLEAR   Specific Gravity, Urine 1.014 1.005 - 1.030   pH 5.0 5.0 - 8.0   Glucose, UA 50 (A) NEGATIVE mg/dL   Hgb urine dipstick NEGATIVE NEGATIVE   Bilirubin Urine NEGATIVE NEGATIVE   Ketones, ur 5 (A) NEGATIVE mg/dL   Protein, ur 899 (A) NEGATIVE mg/dL   Nitrite NEGATIVE NEGATIVE   Leukocytes,Ua NEGATIVE NEGATIVE   RBC / HPF 0-5 0 - 5 RBC/hpf   WBC, UA 0-5 0 - 5 WBC/hpf   Bacteria, UA NONE SEEN NONE SEEN   Squamous Epithelial / HPF 0-5 0 - 5 /HPF   Mucus PRESENT     Comment: Performed at [facility], [address] [place], [place] [address]  CBG monitoring, ED     Status: Abnormal   Collection Time: 12/30/23 12:24 AM  Result Value Ref Range   Glucose-Capillary 135 (H) 70 - 99 mg/dL    Comment: Glucose reference range applies only to samples taken after fasting for at least 8 hours.  CBG monitoring, ED     Status: Abnormal   Collection Time: 12/30/23  4:07 AM  Result Value Ref Range   Glucose-Capillary  125 (H) 70 - 99 mg/dL    Comment: Glucose reference range applies only to samples taken after fasting for at least 8 hours.  Basic metabolic panel     Status: Abnormal   Collection Time: 12/30/23  4:39 AM  Result Value Ref Range   Sodium 137 135 - 145 mmol/L   Potassium 4.1 3.5 - 5.1 mmol/L   Chloride 101 98 - 111 mmol/L   CO2 28 22 - 32 mmol/L   Glucose, Bld 109 (H) 70 - 99 mg/dL    Comment:  Glucose reference range applies only to samples taken after fasting for at least 8 hours.   BUN 11 8 - 23 mg/dL   Creatinine, Ser 8.74 (H) 0.44 - 1.00 mg/dL   Calcium  8.9 8.9 - 10.3 mg/dL   GFR, Estimated 42 (L) >60 mL/min    Comment: (NOTE) Calculated using the CKD-EPI Creatinine Equation (2021)    Anion gap 8 5 - 15    Comment: Performed at [facility], [address], [place], [place] [address]  CBC     Status: Abnormal   Collection Time: 12/30/23  4:39 AM  Result Value Ref Range   WBC 22.0 (H) 4.0 - 10.5 K/uL   RBC 4.03 3.87 - 5.11 MIL/uL   Hemoglobin 11.4 (L) 12.0 - 15.0 g/dL   HCT 65.4 (L) 63.9 - 53.9 %   MCV 85.6 80.0 - 100.0 fL   MCH 28.3 26.0 - 34.0 pg   MCHC 33.0 30.0 - 36.0 g/dL   RDW 84.1 (H) 88.4 - 84.4 %   Platelets 176 150 - 400 K/uL   nRBC 0.0 0.0 - 0.2 %    Comment: Performed at [facility], [address], [place], [place] [address]  Hepatic function panel     Status: Abnormal   Collection Time: 12/30/23  4:39 AM  Result Value Ref Range   Total Protein 6.4 (L) 6.5 - 8.1 g/dL   Albumin 3.5 3.5 - 5.0 g/dL   AST 14 (L) 15 - 41 U/L   ALT 5 0 - 44 U/L   Alkaline Phosphatase 91 38 - 126 U/L   Total Bilirubin 0.4 0.0 - 1.2 mg/dL   Bilirubin, Direct 0.2 0.0 - 0.2 mg/dL   Indirect Bilirubin 0.2 (L) 0.3 - 0.9 mg/dL    Comment: Performed at [facility], [address] [place], [place] [address]    US  Abdomen Limited RUQ (LIVER/GB) Result Date: 12/29/2023 CLINICAL DATA:  Possible gallbladder inflammation on CT today. EXAM: ULTRASOUND ABDOMEN LIMITED RIGHT UPPER QUADRANT COMPARISON:  CT abdomen/pelvis earlier today. FINDINGS: Gallbladder: Mild prominence of the gallbladder measuring 13.1 cm in length. There is moderate gallbladder sludge without evidence of cholelithiasis. Slight gallbladder wall thickening measuring 3.7 mm. Positive sonographic Murphy sign. Scant amount pericholecystic fluid. Common bile duct: Diameter: 4.6 mm. Liver: 1.6 cm cyst over the right lobe.  Somewhat coarse parenchymal echogenicity compatible with a degree of steatosis. Portal vein is patent on color Doppler imaging with normal direction of blood flow towards the liver. Other: None. IMPRESSION: 1. Mild gallbladder dilatation with moderate sludge and minimal associated wall thickening. Positive sonographic Murphy sign and scant amount of pericholecystic fluid. Findings may be seen with acute cholecystitis. 2. 1.6 cm right hepatic cyst. Electronically Signed   By: Toribio Agreste M.D.   On: 12/29/2023 15:59   CT ABDOMEN PELVIS W CONTRAST Result Date: 12/29/2023 CLINICAL DATA:  Acute abdominal pain and vomiting. 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 EXAM: CT  ABDOMEN AND PELVIS WITH CONTRAST TECHNIQUE: Multidetector CT imaging of the abdomen and pelvis was performed using the standard protocol following bolus administration of intravenous contrast. RADIATION DOSE REDUCTION: This exam was performed according to the departmental dose-optimization program which includes automated exposure control, adjustment of the mA and/or kV according to patient size and/or use of iterative reconstruction technique. CONTRAST:  80mL OMNIPAQUE  IOHEXOL  300 MG/ML  SOLN COMPARISON:  12/21/2023, 11/25/2020 FINDINGS: Lower chest: Mild stable cardiomegaly. Mild calcified plaque over the descending thoracic aorta. Visualized lung bases demonstrate no acute process. Hepatobiliary: Several small liver cysts unchanged. Mild dilatation of the gallbladder with slight ill definition of the wall. No definite cholelithiasis. Biliary tree is otherwise unremarkable. Pancreas: Normal. Spleen: Normal. Adrenals/Urinary Tract: Right adrenal gland is normal. 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 Kidneys are normal in size. Several bilateral renal cysts are present and unchanged. There is a new hypoechoic area over the mid to upper pole lateral left renal cortex measuring  approximately 1.6 x 2.7 cm with a smaller similar hypoechoic area over the mid to lower pole left renal cortex. These findings were not present on previous exams as the larger upper pole hypoechoic area is new compared to 12/21/2023. Mild stranding of the left perinephric fat. Findings likely due to pyelonephritis. Ureters and bladder are unremarkable. Stomach/Bowel: Possible small sliding hiatal hernia. Stomach is otherwise unremarkable. Small bowel is normal. Significant diverticulosis throughout the sigmoid colon without active inflammation. Moderate diverticulosis over the descending colon. Subtle thin rimmed oval fatty structure adjacent the lateral aspect of the descending colon without significant change as this can be seen with the people appendagitis. Prior appendectomy. Vascular/Lymphatic: Moderate calcified plaque over the abdominal aorta which is normal in caliber. No adenopathy. Reproductive: Uterus and bilateral adnexa are unremarkable. Other: No free peritoneal air. Musculoskeletal: Posterior fusion hardware intact from L2-S1. IMPRESSION: 1. New hypoechoic areas over the mid to upper pole and mid to lower pole left renal cortex with mild stranding of the left perinephric fat. Findings likely due to pyelonephritis. 2. Mild dilatation of the gallbladder with slight ill definition of the wall. No definite cholelithiasis. Recommend right upper quadrant ultrasound if concern for acute cholecystitis. 3. 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 4. Colonic diverticulosis without active inflammation. Findings adjacent the distal descending colon unchanged from 12/21/2023, although new since 10/18/2023 which can be seen with epiploic appendagitis. 5. Aortic atherosclerosis. Aortic Atherosclerosis (ICD10-I70.0). Electronically Signed   By: Toribio Agreste M.D.   On: 12/29/2023 13:35   CT Head Wo Contrast Result Date: 12/29/2023 EXAM: CT Head Without Intravenous  Contrast. CLINICAL HISTORY: Altered mental status, nontraumatic (Ped 0-17y); severe hypertension, vomiting. TECHNIQUE: Axial computed tomography images of the head/brain without intravenous contrast. Dose reduction technique was used including one or more of the following: automated exposure control, adjustment of mA and kV according to patient size, and/or iterative reconstruction. CONTRAST: Without. COMPARISON: CT head 07/03/2021. FINDINGS: BRAIN: No acute intraparenchymal hemorrhage. No mass lesion. No CT evidence for acute territorial infarct. No midline shift or extra-axial collection. Mild chronic microvascular ischemic changes and mild age related parenchymal volume loss. 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 Atherosclerosis of the carotid siphons. VENTRICLES: No hydrocephalus. ORBITS: Bilateral lens replacements. SINUSES AND MASTOIDS: Mucosal thickening in the left sphenoid sinus with thickening of the sinus wall suggestive of mucoperiosteal reaction compatible with chronic sinusitis. The remaining paranasal  sinuses and mastoid air cells are clear. SOFT TISSUES: No significant facial or scalp soft tissue swelling evident. No radiopaque foreign body is seen. BONES: No acute skull fracture. IMPRESSION: 1. No acute findings. 2. 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 3. Mild chronic microvascular ischemic changes and mild age-related parenchymal volume loss. 4. Mucosal thickening in the left sphenoid sinus with thickening of the sinus wall and mucoperiosteal reaction compatible with chronic sinusitis. Electronically signed by: Donnice Mania MD 12/29/2023 01:03 PM EST RP Workstation: [id number]   DG Abdomen Acute W/Chest Result Date: 12/29/2023 EXAM: XR Acute Abdomen Series with XR Chest, upright and supine views of the abdomen, frontal view of the chest, 4 Views. CLINICAL HISTORY: abd pain / vomiting COMPARISON: 12/24/2023 FINDINGS: LUNGS: Vascular  congestion without overt edema. Pulmonary nodule at the right base with adjacent fiducial marker as characterized by CT on 10/08/2023. No focal airspace consolidation. PLEURAL SPACES: No pleural effusion. No pneumothorax. HEART: Cardiomegaly. MEDIASTINUM: Unremarkable. PERITONEUM: The upright film shows no intraperitoneal free air. BOWEL: No gaseous bowel dilatation to suggest obstruction. BONES: Lumbar spinal fusion hardware evident. No acute osseous abnormality. IMPRESSION: 1. No radiographic evidence of bowel obstruction or perforation. 2. Cardiomegaly with pulmonary vascular congestion, without overt edema. 3. Right basilar pulmonary nodule with adjacent fiducial marker, as previously characterized on CT dated 10/08/2023. Electronically signed by: Camellia Candle MD 12/29/2023 09:39 AM EST RP Workstation: [id number]     Assessment & Plan:  DAVONA KINOSHITA is a 88 y.o. female who was admitted with concern for acute cholecystitis.  Imaging and blood work evaluated by myself.  -Given that some of her initial imaging was somewhat equivocal for acute cholecystitis and she had no leukocytosis, I recommended HIDA scan -HIDA scan demonstrates no visualization of the gallbladder, most consistent with acute cholecystitis.  Patient's leukocytosis also significantly increased today from 10.5 to 22 -LFTs within normal limits -Plan will be for robotic assisted laparoscopic cholecystectomy today -I counseled the patient about the indication, risks and benefits of robotic assisted laparoscopic cholecystectomy.  She understands there is a very small chance for bleeding, infection, injury to normal structures (including common bile duct), conversion to open surgery, persistent symptoms, evolution of postcholecystectomy diarrhea, need for secondary interventions, anesthesia reaction, cardiopulmonary issues and other risks not specifically detailed here. I described the expected recovery, the plan for follow-up and the  restrictions during the recovery phase.  All questions were answered. -NPO -Continue IV Rocephin  and Flagyl  -ICG ordered for the patient preoperatively -IVF -Further recommendations to follow surgery -Appreciate hospitalist recommendations  All questions were answered to the satisfaction of the patient.  Note: Portions of this report may have been transcribed using voice recognition software. Every effort has been made to ensure accuracy; however, inadvertent computerized transcription errors may still be present.   -- Dorothyann Brittle, DO [facility] [address] [place], [place] [address] [phone] (office)       [1]  Social History Tobacco Use   Smoking status: Former    Current packs/day: 1.00    Types: Cigarettes    Passive exposure: Never   Smokeless tobacco: Never   Tobacco comments:    Quit over 40 years ago.  Does not know how many years she smoked or start/stop date  Vaping Use   Vaping status: Never Used  Substance Use Topics   Alcohol use: No   Drug use: No  [2]  Allergies Allergen Reactions   Nitrofurantoin Nausea And Vomiting  Sulfa Antibiotics Swelling    Lip swelling

## 2023-12-30 NOTE — Progress Notes (Signed)
 PHARMACY CONSULT NOTE FOR:  OUTPATIENT  PARENTERAL ANTIBIOTIC THERAPY (OPAT)  Indication: Enterococcal endocarditis Regimen: Ampicillin  8 gm every 24 hours as a continuous infusion + Ceftriaxone  2 gm IV Q 12 hours  End date: 02/05/24  Method of administration of ampicillin  may be changed by home health depending on family preference for continuous infusion vs intermittent infusion.    IV antibiotic discharge orders are pended. To discharging provider:  please sign these orders via discharge navigator,  Select New Orders & click on the button choice - Manage This Unsigned Work.     Thank you for allowing pharmacy to be a part of this patient's care.  Damien Quiet, PharmD, BCPS, BCIDP Infectious Diseases Clinical Pharmacist Phone: [phone] 12/30/2023, 2:03 PM

## 2023-12-30 NOTE — ED Notes (Signed)
 Pt ambulated to [facility] with assistance. Pt in bed with no additional needs at this time.

## 2023-12-30 NOTE — Plan of Care (Signed)

## 2023-12-30 NOTE — Progress Notes (Signed)
 Family was notified that the patient was in her room upstairs on the 2nd floor, ICU-3.  Informed them patient was awake and talking.  There were 3 family members present.  They voiced understanding.

## 2023-12-30 NOTE — TOC Initial Note (Signed)
 Transition of Care [facility]) - Initial/Assessment Note    Patient Details  Name: Stephanie [name] MRN: [id number] Date of Birth: [birth year]  [facility]) CM/SW Contact:    Hoy DELENA Bigness, LCSW Phone Number: 12/30/2023, 10:50 AM  Clinical Narrative:                 Pt high risk for readmission. Pt assessed through chart review. Pt recently discharged from the hospital on 12/10 to complete 6 week course of IV abx. Pt lives at home with her grandchildren and uses a RW at baseline for ambulation. Pt's family provide transportation for pt. [facility] is following and providing home IV abx. Pt is active with [facility] for [facility] services. Pt will ROC order placed for HH at discharge. ICM will continue to follow.   Expected Discharge Plan: Home w Home Health Services Barriers to Discharge: Continued Medical Work up   Patient Goals and CMS Choice Patient states their goals for this hospitalization and ongoing recovery are:: To return home CMS Medicare.gov Compare Post Acute Care list provided to:: Patient Choice offered to / list presented to : Patient      Expected Discharge Plan and Services In-house Referral: Clinical Social Work Discharge Planning Services: NA Post Acute Care Choice: Resumption of Svcs/PTA Provider, Home Health Living arrangements for the past 2 months: Mobile Home                                      Prior Living Arrangements/Services Living arrangements for the past 2 months: Mobile Home Lives with:: Relatives Patient language and need for interpreter reviewed:: Yes Do you feel safe going back to the place where you live?: Yes      Need for Family Participation in Patient Care: Yes (Comment) Care giver support system in place?: Yes (comment) Current home services: DME, Home RN (RW, [facility] w/ [facility]) Criminal Activity/Legal Involvement Pertinent to Current Situation/Hospitalization: No - Comment as needed  Activities of Daily Living       Permission Sought/Granted Permission sought to share information with : Family Supports, Oceanographer granted to share information with : Yes, Verbal Permission Granted              Emotional Assessment Appearance:: Appears stated age   Affect (typically observed): Pleasant Orientation: : Oriented to Self, Oriented to Place, Oriented to  Time, Oriented to Situation Alcohol / Substance Use: Not Applicable Psych Involvement: No (comment)  Admission diagnosis:  Acute cholecystitis [K81.0] Pyelonephritis [N12] Hypertensive urgency [I16.0] Severe hypertension [I10] Nausea and vomiting, unspecified vomiting type [R11.2] Patient Active Problem List   Diagnosis Date Noted   Hypertensive urgency 12/29/2023   Acute cholecystitis 12/29/2023   Nausea and vomiting 12/29/2023   Pyelonephritis 12/29/2023   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   Sepsis without acute organ dysfunction (HCC) 12/27/2023   Enterococcal bacteremia 12/25/2023   Hyponatremia 12/24/2023   Chest pain 10/31/2023   Obesity, Class I, BMI 30-34.9 10/31/2023   Chronic heart failure with preserved ejection fraction (HFpEF) (HCC) 10/31/2023   Paroxysmal atrial fibrillation (HCC) 10/31/2023   Acute respiratory failure with hypoxia (HCC) 09/24/2023   Acute heart failure with preserved ejection fraction (HFpEF) (HCC) 09/24/2023   Chronic respiratory failure with hypoxia (HCC) 09/10/2023   Heart failure (HCC) 09/03/2023   COPD with acute exacerbation (HCC) 09/03/2023   Rt LL Lung mass 08/12/2023   Acute kidney injury superimposed  on stage 4 chronic kidney disease (HCC) 04/07/2023   CKD stage 3b, GFR 30-44 ml/min (HCC) 04/07/2023   Atherosclerosis of artery of extremity with intermittent claudication 09/15/2022   COPD (chronic obstructive pulmonary disease) (HCC) 03/21/2022   Lung nodule 02/09/2022   Diverticulitis 07/02/2021   Solitary pulmonary nodule on lung CT 03/31/2021   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   DOE (dyspnea on exertion) 04/08/2020   Asthma 04/08/2020   DM (diabetes mellitus) (HCC) 04/08/2020   Severe aortic stenosis 04/08/2020   Symptomatic anemia 02/09/2019   Vitamin B12 deficiency    UTI (urinary tract infection) 11/15/2017   Type 2 diabetes mellitus with chronic kidney disease, with long-term current use of insulin  (HCC) 07/06/2017   Essential hypertension, benign 07/06/2017   Mixed hyperlipidemia 07/06/2017   Obesity, class 2 07/06/2017   Medial meniscus, posterior horn derangement 04/12/2012   OA (osteoarthritis) of knee 04/12/2012   PCP:  [facility] Pharmacy:   [facility] - [place], [place] - [address] [place] [address] Phone: [phone] Fax: [phone]     Social Drivers of Health (SDOH) Social History: SDOH Screenings   Food Insecurity: No Food Insecurity (12/24/2023)  Housing: Low Risk (12/24/2023)  Transportation Needs: No Transportation Needs (12/24/2023)  Utilities: Not At Risk (12/24/2023)  Depression (PHQ2-9): Low Risk (03/12/2022)  Social Connections: Moderately Isolated (12/24/2023)  Tobacco Use: Medium Risk (12/30/2023)   SDOH Interventions:     Readmission Risk Interventions    12/30/2023   10:48 AM 12/28/2023   12:17 PM 12/26/2023    8:28 AM  Readmission Risk Prevention Plan  Transportation Screening Complete Complete Complete  Medication Review Oceanographer) Complete Complete Complete  PCP or Specialist appointment within 3-5 days of discharge Complete Complete   HRI or Home Care Consult Complete Complete Complete  SW Recovery Care/Counseling Consult Complete Complete Complete  Palliative Care Screening Not Applicable Not Applicable Not Applicable  Skilled Nursing Facility Not Applicable Not Applicable Not Applicable

## 2023-12-30 NOTE — Progress Notes (Signed)
 PROGRESS NOTE    Stephanie [name]  FMW:[id number] DOB: 1935-04-03 DOA: 12/29/2023 PCP: [facility]   Brief Narrative:    Stephanie [name] is a 88 y.o. female with medical history significant for congestive heart failure, CKD 4, diabetes mellitus, COPD, asthma, TAVR, paroxysmal atrial fibrillation, recent enterococcal bacteremia.   Patient was just discharged from the hospital-hospitalized 12/6 to 12/10 for sepsis secondary to enterococcal bacteremia, source of infection was unclear, hide urine cultures grew E. coli.  TTE was without vegetation, TEE not done -due to age, pulmonary function and obesity.  She was treated presumptively for prosthetic valve endocarditis. She was discharged home with home health to complete a 6-week course of IV antibiotic-continuous IV ampicillin  infusion and ceftriaxone  2 g daily, via PICC line.  Anticipated stop date was 02/05/2023.   She reports onset of vomiting, several episodes today.  With diffuse abdominal pain.  No diarrhea.  No blood or coffee grounds in vomitus.  Denies prior similar episodes.   ED Course: Hypertensive, ultrasound showing acute cholecystitis. Patient underwent laparoscopic cholecystectomy today.  Assessment & Plan:   Principal Problem:   Acute cholecystitis Active Problems:   Hypertensive urgency   Asthma   DM (diabetes mellitus) (HCC)   S/P TAVR (transcatheter aortic valve replacement)   COPD (chronic obstructive pulmonary disease) (HCC)   Chronic heart failure with preserved ejection fraction (HFpEF) (HCC)   Paroxysmal atrial fibrillation (HCC)   Prosthetic valve endocarditis   Nausea and vomiting   Pyelonephritis   Acute cholecystitis-patient is with abdominal pain nausea and vomiting.   Status is on presentation, ultrasound as well as HIDA scan suggestive of acute cholecystitis. Patient underwent laparoscopic cholecystectomy, uneventful. Will continue to monitor, IV Zofran , analgesics as  needed  Hypertensive urgency-blood pressures up to 219 systolic on presentation. Likely from blood acute illness. Will resume home medications IV as needed available  Enterococcal bacteremia/presumptive prosthetic valve endocarditis-TTE was without vegetation, TTE-deferred due to age, pulmonary function and obesity.  Was to complete 6-week course of IV ampicillin  infusion, and IV ceftriaxone , anticipated stop date-02/05/2023. - Continue IV ampicillin -pharmacy to dose, and ceftriaxone    Acute pyelonephritis on imaging- was just treated for E. coli UTI during recent hospitalization.  UA today not suggestive of UTI. ??Imaging findings lagging behind treatment. - Will follow urine cultures  Paroxysmal atrial fibrillation-not on anticoagulation due to history of GI bleed. - Beta-blocker held for now due to vomiting.     [id number] for severe aortic stenosis.   Asthma/COPD-stable. - Alb nebs as needed - Home regimen     DVT prophylaxis: SCDS for now  Code Status: FULL Family Communication: None at bedside Disposition Plan: ~ 2 days Consults called: Gen Surg Admission status: Inpt Stepdown I certify that at the point of admission it is my clinical judgment that the patient will require inpatient hospital care spanning beyond 2 midnights from the point of admission due to high intensity of service, high risk for further deterioration and high frequency of surveillance required.      Subjective:   Objective: Vitals:   12/30/23 1300 12/30/23 1323 12/30/23 1324 12/30/23 1656  BP: (!) 153/67  (!) 141/58 (!) 126/49  Pulse: 78  86   Resp: (!) 30  (!) 28   Temp:  98.7 F (37.1 C)    TempSrc:  Oral    SpO2: 100%  99%   Weight:  89.3 kg    Height:  5' 5 (1.651 m)  Intake/Output Summary (Last 24 hours) at 12/30/2023 1709 Last data filed at 12/30/2023 1221 Gross per 24 hour  Intake 1247.88 ml  Output 20 ml  Net 1227.88 ml   Filed Weights   12/29/23 0831 12/30/23 1323   Weight: 89 kg 89.3 kg    Examination:  General: Alert, oriented, comfortable Chest: Clear CVS: S1, S2, no murmur, regular rhythm Abdomen: mildly distended, nontender, bowel sounds minimal, Extremities: No edema   Data Reviewed: I have personally reviewed following labs and imaging studies  CBC: Recent Labs  Lab 12/25/23 0512 12/26/23 0710 12/27/23 0417 12/29/23 0906 12/30/23 0439  WBC 17.3* 13.1* 11.3* 10.5 22.0*  NEUTROABS  --   --   --  8.7*  --   HGB 11.8* 11.5* 11.1* 12.5 11.4*  HCT 36.0 35.0* 34.1* 38.5 34.5*  MCV 85.1 85.8 85.7 85.9 85.6  PLT 153 125* 130* 154 176   Basic Metabolic Panel: Recent Labs  Lab 12/25/23 0512 12/26/23 0510 12/28/23 0502 12/29/23 0906 12/30/23 0439  NA 133* 133* 136 134* 137  K 4.1 3.8 4.1 4.4 4.1  CL 98 98 100 97* 101  CO2 28 21* 27 22 28   GLUCOSE 116* 95 109* 157* 109*  BUN 16 21 15 11 11   CREATININE 1.65* 1.69* 1.42* 1.17* 1.25*  CALCIUM  8.9 8.9 9.0 9.1 8.9  PHOS 2.7  --   --   --   --    GFR: Estimated Creatinine Clearance: 35.6 mL/min (A) (by C-G formula based on SCr of 1.25 mg/dL (H)). Liver Function Tests: Recent Labs  Lab 12/24/23 1420 12/25/23 0512 12/29/23 0906 12/30/23 0439  AST 21  --  19 14*  ALT 7  --  6 5  ALKPHOS 116  --  103 91  BILITOT 1.2  --  0.3 0.4  PROT 6.5  --  6.8 6.4*  ALBUMIN 3.7 3.4* 3.7 3.5   Recent Labs  Lab 12/29/23 0906  LIPASE 29   No results for input(s): AMMONIA in the last 168 hours. Coagulation Profile: Recent Labs  Lab 12/24/23 1420  INR 1.0   Cardiac Enzymes: Recent Labs  Lab 12/26/23 0510  CKTOTAL 45   BNP (last 3 results) No results for input(s): PROBNP in the last 8760 hours. HbA1C: No results for input(s): HGBA1C in the last 72 hours. CBG: Recent Labs  Lab 12/30/23 0024 12/30/23 0407 12/30/23 1248 12/30/23 1325 12/30/23 1612  GLUCAP 135* 125* 103* 112* 108*   Lipid Profile: No results for input(s): CHOL, HDL, LDLCALC, TRIG,  CHOLHDL, LDLDIRECT in the last 72 hours. Thyroid  Function Tests: No results for input(s): TSH, T4TOTAL, FREET4, T3FREE, THYROIDAB in the last 72 hours. Anemia Panel: No results for input(s): VITAMINB12, FOLATE, FERRITIN, TIBC, IRON, RETICCTPCT in the last 72 hours. Sepsis Labs: Recent Labs  Lab 12/24/23 1542  LATICACIDVEN 1.2    Recent Results (from the past 240 hours)  Resp panel by RT-PCR (RSV, Flu A&B, Covid) Anterior Nasal Swab     Status: None   Collection Time: 12/24/23  2:27 PM   Specimen: Anterior Nasal Swab  Result Value Ref Range Status   SARS Coronavirus 2 by RT PCR NEGATIVE NEGATIVE Final    Comment: (NOTE) SARS-CoV-2 target nucleic acids are NOT DETECTED.  The SARS-CoV-2 RNA is generally detectable in upper respiratory specimens during the acute phase of infection. The lowest concentration of SARS-CoV-2 viral copies this assay can detect is 138 copies/mL. A negative result does not preclude SARS-Cov-2 infection and should not be used  as the sole basis for treatment or other patient management decisions. A negative result may occur with  improper specimen collection/handling, submission of specimen other than nasopharyngeal swab, presence of viral mutation(s) within the areas targeted by this assay, and inadequate number of viral copies(<138 copies/mL). A negative result must be combined with clinical observations, patient history, and epidemiological information. The expected result is Negative.  Fact Sheet for Patients:  bloggercourse.com  Fact Sheet for Healthcare Providers:  seriousbroker.it  This test is no t yet approved or cleared by the [place]  FDA and  has been authorized for detection and/or diagnosis of SARS-CoV-2 by FDA under an Emergency Use Authorization (EUA). This EUA will remain  in effect (meaning this test can be used) for the duration of the COVID-19 declaration  under Section 564(b)(1) of the Act, 21 U.S.C.section 360bbb-3(b)(1), unless the authorization is terminated  or revoked sooner.       Influenza A by PCR NEGATIVE NEGATIVE Final   Influenza B by PCR NEGATIVE NEGATIVE Final    Comment: (NOTE) The Xpert Xpress SARS-CoV-2/FLU/RSV plus assay is intended as an aid in the diagnosis of influenza from Nasopharyngeal swab specimens and should not be used as a sole basis for treatment. Nasal washings and aspirates are unacceptable for Xpert Xpress SARS-CoV-2/FLU/RSV testing.  Fact Sheet for Patients: bloggercourse.com  Fact Sheet for Healthcare Providers: seriousbroker.it  This test is not yet approved or cleared by the [place]  FDA and has been authorized for detection and/or diagnosis of SARS-CoV-2 by FDA under an Emergency Use Authorization (EUA). This EUA will remain in effect (meaning this test can be used) for the duration of the COVID-19 declaration under Section 564(b)(1) of the Act, 21 U.S.C. section 360bbb-3(b)(1), unless the authorization is terminated or revoked.     Resp Syncytial Virus by PCR NEGATIVE NEGATIVE Final    Comment: (NOTE) Fact Sheet for Patients: bloggercourse.com  Fact Sheet for Healthcare Providers: seriousbroker.it  This test is not yet approved or cleared by the [place]  FDA and has been authorized for detection and/or diagnosis of SARS-CoV-2 by FDA under an Emergency Use Authorization (EUA). This EUA will remain in effect (meaning this test can be used) for the duration of the COVID-19 declaration under Section 564(b)(1) of the Act, 21 U.S.C. section 360bbb-3(b)(1), unless the authorization is terminated or revoked.  Performed at [facility], [address] [place], [place] [address]   Culture, blood (routine x 2)     Status: Abnormal   Collection Time: 12/24/23  3:42 PM   Specimen: BLOOD  LEFT ARM  Result Value Ref Range Status   Specimen Description   Final    BLOOD LEFT ARM BOTTLES DRAWN AEROBIC AND ANAEROBIC Performed at [facility], [address], [place], [place] [address]    Special Requests   Final    Blood Culture results may not be optimal due to an inadequate volume of blood received in culture bottles Performed at [facility], [address], [place], [place] [address]    Culture  Setup Time   Final    GRAM POSITIVE COCCI IN BOTH AEROBIC AND ANAEROBIC BOTTLES CRITICAL RESULT CALLED TO, READ BACK BY AND VERIFIED WITH: ANABEL,RN ON 12/25/23 AT 0430 BY PURDIE,J CRITICAL VALUE NOTED.  VALUE IS CONSISTENT WITH PREVIOUSLY REPORTED AND CALLED VALUE. Performed at [facility], [address] [place], [place] [address]    Culture (A)  Final    ENTEROCOCCUS FAECALIS SUSCEPTIBILITIES PERFORMED ON PREVIOUS CULTURE WITHIN THE LAST 5 DAYS.  Performed at [facility], [address], [place], [place] [address]    Report Status 12/27/2023 FINAL  Final  Culture, blood (routine x 2)     Status: Abnormal   Collection Time: 12/24/23  4:09 PM   Specimen: Left Antecubital; Blood  Result Value Ref Range Status   Specimen Description   Final    LEFT ANTECUBITAL BOTTLES DRAWN AEROBIC AND ANAEROBIC Performed at [facility], [address] [place], [place] [address]    Special Requests   Final    Blood Culture results may not be optimal due to an inadequate volume of blood received in culture bottles Performed at [facility], [address] [place], [place] [address]    Culture  Setup Time   Final    GRAM POSITIVE COCCI IN BOTH AEROBIC AND ANAEROBIC BOTTLES CRITICAL RESULT CALLED TO, READ BACK BY AND VERIFIED WITH: ANABEL,RN ON 12/25/23 AT 0430 BY PURDIE,J CRITICAL RESULT CALLED TO, READ BACK BY AND VERIFIED WITH: RN ARMOND [id number] AT 1020 BY EC Performed at [facility], [address], [place], [place] [address]    Culture ENTEROCOCCUS FAECALIS (A)  Final    Report Status 12/27/2023 FINAL  Final   Organism ID, Bacteria ENTEROCOCCUS FAECALIS  Final      Susceptibility   Enterococcus faecalis - MIC*    AMPICILLIN  <=2 SENSITIVE Sensitive     VANCOMYCIN  1 SENSITIVE Sensitive     GENTAMICIN SYNERGY SENSITIVE Sensitive     * ENTEROCOCCUS FAECALIS  Blood Culture ID Panel (Reflexed)     Status: Abnormal   Collection Time: 12/24/23  4:09 PM  Result Value Ref Range Status   Enterococcus faecalis DETECTED (A) NOT DETECTED Final    Comment: CRITICAL RESULT CALLED TO, READ BACK BY AND VERIFIED WITH: RN ARMOND [id number] AT 1020 BY EC    Enterococcus Faecium NOT DETECTED NOT DETECTED Final   Listeria monocytogenes NOT DETECTED NOT DETECTED Final   Staphylococcus species NOT DETECTED NOT DETECTED Final   Staphylococcus aureus (BCID) NOT DETECTED NOT DETECTED Final   Staphylococcus epidermidis NOT DETECTED NOT DETECTED Final   Staphylococcus lugdunensis NOT DETECTED NOT DETECTED Final   Streptococcus species NOT DETECTED NOT DETECTED Final   Streptococcus agalactiae NOT DETECTED NOT DETECTED Final   Streptococcus pneumoniae NOT DETECTED NOT DETECTED Final   Streptococcus pyogenes NOT DETECTED NOT DETECTED Final   A.calcoaceticus-baumannii NOT DETECTED NOT DETECTED Final   Bacteroides fragilis NOT DETECTED NOT DETECTED Final   Enterobacterales NOT DETECTED NOT DETECTED Final   Enterobacter cloacae complex NOT DETECTED NOT DETECTED Final   Escherichia coli NOT DETECTED NOT DETECTED Final   Klebsiella aerogenes NOT DETECTED NOT DETECTED Final   Klebsiella oxytoca NOT DETECTED NOT DETECTED Final   Klebsiella pneumoniae NOT DETECTED NOT DETECTED Final   Proteus species NOT DETECTED NOT DETECTED Final   Salmonella species NOT DETECTED NOT DETECTED Final   Serratia marcescens NOT DETECTED NOT DETECTED Final   Haemophilus influenzae NOT DETECTED NOT DETECTED Final   Neisseria meningitidis NOT DETECTED NOT DETECTED Final   Pseudomonas aeruginosa NOT  DETECTED NOT DETECTED Final   Stenotrophomonas maltophilia NOT DETECTED NOT DETECTED Final   Candida albicans NOT DETECTED NOT DETECTED Final   Candida auris NOT DETECTED NOT DETECTED Final   Candida glabrata NOT DETECTED NOT DETECTED Final   Candida krusei NOT DETECTED NOT DETECTED Final   Candida parapsilosis NOT DETECTED NOT DETECTED Final   Candida tropicalis NOT DETECTED NOT DETECTED Final   Cryptococcus  neoformans/gattii NOT DETECTED NOT DETECTED Final   Vancomycin  resistance NOT DETECTED NOT DETECTED Final    Comment: Performed at [facility], [address] [place], [place] [address]  Urine Culture     Status: Abnormal   Collection Time: 12/24/23  4:25 PM   Specimen: Urine, Clean Catch  Result Value Ref Range Status   Specimen Description   Final    URINE, CLEAN CATCH Performed at [facility], [address] [place], [place] [address]    Special Requests   Final    NONE Performed at [facility], [address] [place], [place] [address]    Culture >=100,000 COLONIES/mL ESCHERICHIA COLI (A)  Final   Report Status 12/26/2023 FINAL  Final   Organism ID, Bacteria ESCHERICHIA COLI (A)  Final      Susceptibility   Escherichia coli - MIC*    AMPICILLIN  >=32 RESISTANT Resistant     CEFAZOLIN  (URINE) Value in next row Sensitive      2 SENSITIVEThis is a modified FDA-approved test that has been validated and its performance characteristics determined by the reporting laboratory.  This laboratory is certified under the Clinical Laboratory Improvement Amendments CLIA as qualified to perform high complexity clinical laboratory testing.    CEFEPIME Value in next row Sensitive      2 SENSITIVEThis is a modified FDA-approved test that has been validated and its performance characteristics determined by the reporting laboratory.  This laboratory is certified under the Clinical Laboratory Improvement Amendments CLIA as qualified to perform high complexity clinical laboratory testing.     ERTAPENEM Value in next row Sensitive      2 SENSITIVEThis is a modified FDA-approved test that has been validated and its performance characteristics determined by the reporting laboratory.  This laboratory is certified under the Clinical Laboratory Improvement Amendments CLIA as qualified to perform high complexity clinical laboratory testing.    CEFTRIAXONE  Value in next row Sensitive      2 SENSITIVEThis is a modified FDA-approved test that has been validated and its performance characteristics determined by the reporting laboratory.  This laboratory is certified under the Clinical Laboratory Improvement Amendments CLIA as qualified to perform high complexity clinical laboratory testing.    CIPROFLOXACIN  Value in next row Intermediate      2 SENSITIVEThis is a modified FDA-approved test that has been validated and its performance characteristics determined by the reporting laboratory.  This laboratory is certified under the Clinical Laboratory Improvement Amendments CLIA as qualified to perform high complexity clinical laboratory testing.    GENTAMICIN Value in next row Sensitive      2 SENSITIVEThis is a modified FDA-approved test that has been validated and its performance characteristics determined by the reporting laboratory.  This laboratory is certified under the Clinical Laboratory Improvement Amendments CLIA as qualified to perform high complexity clinical laboratory testing.    NITROFURANTOIN Value in next row Sensitive      2 SENSITIVEThis is a modified FDA-approved test that has been validated and its performance characteristics determined by the reporting laboratory.  This laboratory is certified under the Clinical Laboratory Improvement Amendments CLIA as qualified to perform high complexity clinical laboratory testing.    TRIMETH/SULFA Value in next row Resistant      2 SENSITIVEThis is a modified FDA-approved test that has been validated and its performance characteristics  determined by the reporting laboratory.  This laboratory is certified under the Clinical Laboratory Improvement Amendments CLIA as qualified to perform high complexity clinical laboratory testing.  AMPICILLIN /SULBACTAM Value in next row Sensitive      2 SENSITIVEThis is a modified FDA-approved test that has been validated and its performance characteristics determined by the reporting laboratory.  This laboratory is certified under the Clinical Laboratory Improvement Amendments CLIA as qualified to perform high complexity clinical laboratory testing.    PIP/TAZO Value in next row Sensitive      <=4 SENSITIVEThis is a modified FDA-approved test that has been validated and its performance characteristics determined by the reporting laboratory.  This laboratory is certified under the Clinical Laboratory Improvement Amendments CLIA as qualified to perform high complexity clinical laboratory testing.    MEROPENEM  Value in next row Sensitive      <=4 SENSITIVEThis is a modified FDA-approved test that has been validated and its performance characteristics determined by the reporting laboratory.  This laboratory is certified under the Clinical Laboratory Improvement Amendments CLIA as qualified to perform high complexity clinical laboratory testing.    * >=100,000 COLONIES/mL ESCHERICHIA COLI  Culture, blood (Routine X 2) w Reflex to ID Panel     Status: None   Collection Time: 12/25/23 10:51 AM   Specimen: BLOOD  Result Value Ref Range Status   Specimen Description BLOOD LEFT ANTECUBITAL  Final   Special Requests   Final    BOTTLES DRAWN AEROBIC AND ANAEROBIC Blood Culture adequate volume   Culture   Final    NO GROWTH 5 DAYS Performed at [facility], [address] [place], [place] [address]    Report Status 12/30/2023 FINAL  Final  Culture, blood (Routine X 2) w Reflex to ID Panel     Status: None   Collection Time: 12/25/23 10:51 AM   Specimen: BLOOD  Result Value Ref Range Status   Specimen  Description BLOOD BLOOD LEFT HAND  Final   Special Requests   Final    BOTTLES DRAWN AEROBIC AND ANAEROBIC Blood Culture adequate volume   Culture   Final    NO GROWTH 5 DAYS Performed at [facility], [address], [place], [place] [address]    Report Status 12/30/2023 FINAL  Final  Culture, blood (Routine X 2) w Reflex to ID Panel     Status: None (Preliminary result)   Collection Time: 12/26/23  3:05 PM   Specimen: BLOOD  Result Value Ref Range Status   Specimen Description BLOOD BLOOD LEFT ARM AEROBIC BOTTLE ONLY  Final   Special Requests Blood Culture adequate volume  Final   Culture   Final    NO GROWTH 4 DAYS Performed at [facility], [address] [place], [place] [address]    Report Status PENDING  Incomplete  Culture, blood (Routine X 2) w Reflex to ID Panel     Status: None (Preliminary result)   Collection Time: 12/26/23  3:05 PM   Specimen: BLOOD  Result Value Ref Range Status   Specimen Description BLOOD AEROBIC BOTTLE ONLY LFOA  Final   Special Requests Blood Culture adequate volume  Final   Culture   Final    NO GROWTH 4 DAYS Performed at [facility], [address] [place], [place] [address]    Report Status PENDING  Incomplete         Radiology Studies: NM Hepatobiliary Liver Func Result Date: 12/30/2023 EXAM: NM HEPATOBILLARY SCAN 12/30/2023 08:53:30 AM TECHNIQUE: RADIOPHARMACEUTICAL: 5 mCi Tc-48m mebrofenin (CHOLETEC) injection. Dynamic images of the abdomen and pelvis were obtained in the anterior projection for 1 hour after intravenous administration of radiopharmaceutical. The study was  augmented with 3 mg of IV morphine  and imaging continued for at least 30 minutes. COMPARISON: CT abdomen and pelvis and abdomen ultrasound yesterday. CLINICAL HISTORY: 88 year old female. Abdominal pain with abnormal gallbladder on CT and ultrasound. FINDINGS: Initial good radiotracer uptake by the liver and clearance of the blood pool. Prompt radiotracer transit  through the common bile duct, with excretion from the liver into the small bowel. Activity is visualized in the small bowel. Initially, no definite gallbladder activity occurred (although questionably on image 32 of the initial images - versus proximal duodenum). Following morphine  augmentation, there is rounded accumulation of radiotracer to the right of the common bile duct which persists over the entire second phase of the exam and is suggestive of partial gallbladder filling - possibly superimposed on gallbladder sludge noted by ultrasound. IMPRESSION: 1. Patent common bile duct with prompt radiotracer excretion from the liver. 2. Probable gallbladder activity and patent cystic duct, but only visible after morphine  augmentation. Electronically signed by: Helayne Hurst MD 12/30/2023 10:05 AM EST RP Workstation: [id number]   US  Abdomen Limited RUQ (LIVER/GB) Result Date: 12/29/2023 CLINICAL DATA:  Possible gallbladder inflammation on CT today. EXAM: ULTRASOUND ABDOMEN LIMITED RIGHT UPPER QUADRANT COMPARISON:  CT abdomen/pelvis earlier today. FINDINGS: Gallbladder: Mild prominence of the gallbladder measuring 13.1 cm in length. There is moderate gallbladder sludge without evidence of cholelithiasis. Slight gallbladder wall thickening measuring 3.7 mm. Positive sonographic Murphy sign. Scant amount pericholecystic fluid. Common bile duct: Diameter: 4.6 mm. Liver: 1.6 cm cyst over the right lobe. Somewhat coarse parenchymal echogenicity compatible with a degree of steatosis. Portal vein is patent on color Doppler imaging with normal direction of blood flow towards the liver. Other: None. IMPRESSION: 1. Mild gallbladder dilatation with moderate sludge and minimal associated wall thickening. Positive sonographic Murphy sign and scant amount of pericholecystic fluid. Findings may be seen with acute cholecystitis. 2. 1.6 cm right hepatic cyst. Electronically Signed   By: Toribio Agreste M.D.   On: 12/29/2023 15:59   CT  ABDOMEN PELVIS W CONTRAST Result Date: 12/29/2023 CLINICAL DATA:  Acute abdominal pain and vomiting. 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 EXAM: CT ABDOMEN AND PELVIS WITH CONTRAST TECHNIQUE: Multidetector CT imaging of the abdomen and pelvis was performed using the standard protocol following bolus administration of intravenous contrast. RADIATION DOSE REDUCTION: This exam was performed according to the departmental dose-optimization program which includes automated exposure control, adjustment of the mA and/or kV according to patient size and/or use of iterative reconstruction technique. CONTRAST:  80mL OMNIPAQUE  IOHEXOL  300 MG/ML  SOLN COMPARISON:  12/21/2023, 11/25/2020 FINDINGS: Lower chest: Mild stable cardiomegaly. Mild calcified plaque over the descending thoracic aorta. Visualized lung bases demonstrate no acute process. Hepatobiliary: Several small liver cysts unchanged. Mild dilatation of the gallbladder with slight ill definition of the wall. No definite cholelithiasis. Biliary tree is otherwise unremarkable. Pancreas: Normal. Spleen: Normal. Adrenals/Urinary Tract: Right adrenal gland is normal. 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 Kidneys are normal in size. Several bilateral renal cysts are present and unchanged. There is a new hypoechoic area over the mid to upper pole lateral left renal cortex measuring approximately 1.6 x 2.7 cm with a smaller similar hypoechoic area over the mid to lower pole left renal cortex. These findings were not present on previous exams as the larger upper pole hypoechoic area is new compared to 12/21/2023. Mild stranding of the left perinephric fat. Findings likely due to pyelonephritis. Ureters and bladder are unremarkable. Stomach/Bowel:  Possible small sliding hiatal hernia. Stomach is otherwise unremarkable. Small bowel is normal. Significant diverticulosis throughout the sigmoid colon without active  inflammation. Moderate diverticulosis over the descending colon. Subtle thin rimmed oval fatty structure adjacent the lateral aspect of the descending colon without significant change as this can be seen with the people appendagitis. Prior appendectomy. Vascular/Lymphatic: Moderate calcified plaque over the abdominal aorta which is normal in caliber. No adenopathy. Reproductive: Uterus and bilateral adnexa are unremarkable. Other: No free peritoneal air. Musculoskeletal: Posterior fusion hardware intact from L2-S1. IMPRESSION: 1. New hypoechoic areas over the mid to upper pole and mid to lower pole left renal cortex with mild stranding of the left perinephric fat. Findings likely due to pyelonephritis. 2. Mild dilatation of the gallbladder with slight ill definition of the wall. No definite cholelithiasis. Recommend right upper quadrant ultrasound if concern for acute cholecystitis. 3. 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 4. Colonic diverticulosis without active inflammation. Findings adjacent the distal descending colon unchanged from 12/21/2023, although new since 10/18/2023 which can be seen with epiploic appendagitis. 5. Aortic atherosclerosis. Aortic Atherosclerosis (ICD10-I70.0). Electronically Signed   By: Toribio Agreste M.D.   On: 12/29/2023 13:35   CT Head Wo Contrast Result Date: 12/29/2023 EXAM: CT Head Without Intravenous Contrast. CLINICAL HISTORY: Altered mental status, nontraumatic (Ped 0-17y); severe hypertension, vomiting. TECHNIQUE: Axial computed tomography images of the head/brain without intravenous contrast. Dose reduction technique was used including one or more of the following: automated exposure control, adjustment of mA and kV according to patient size, and/or iterative reconstruction. CONTRAST: Without. COMPARISON: CT head 07/03/2021. FINDINGS: BRAIN: No acute intraparenchymal hemorrhage. No mass lesion. No CT evidence for acute  territorial infarct. No midline shift or extra-axial collection. Mild chronic microvascular ischemic changes and mild age related parenchymal volume loss. 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 Atherosclerosis of the carotid siphons. VENTRICLES: No hydrocephalus. ORBITS: Bilateral lens replacements. SINUSES AND MASTOIDS: Mucosal thickening in the left sphenoid sinus with thickening of the sinus wall suggestive of mucoperiosteal reaction compatible with chronic sinusitis. The remaining paranasal sinuses and mastoid air cells are clear. SOFT TISSUES: No significant facial or scalp soft tissue swelling evident. No radiopaque foreign body is seen. BONES: No acute skull fracture. IMPRESSION: 1. No acute findings. 2. 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 3. Mild chronic microvascular ischemic changes and mild age-related parenchymal volume loss. 4. Mucosal thickening in the left sphenoid sinus with thickening of the sinus wall and mucoperiosteal reaction compatible with chronic sinusitis. Electronically signed by: Donnice Mania MD 12/29/2023 01:03 PM EST RP Workstation: [id number]   DG Abdomen Acute W/Chest Result Date: 12/29/2023 EXAM: XR Acute Abdomen Series with XR Chest, upright and supine views of the abdomen, frontal view of the chest, 4 Views. CLINICAL HISTORY: abd pain / vomiting COMPARISON: 12/24/2023 FINDINGS: LUNGS: Vascular congestion without overt edema. Pulmonary nodule at the right base with adjacent fiducial marker as characterized by CT on 10/08/2023. No focal airspace consolidation. PLEURAL SPACES: No pleural effusion. No pneumothorax. HEART: Cardiomegaly. MEDIASTINUM: Unremarkable. PERITONEUM: The upright film shows no intraperitoneal free air. BOWEL: No gaseous bowel dilatation to suggest obstruction. BONES: Lumbar spinal fusion hardware evident. No acute osseous abnormality. IMPRESSION: 1. No radiographic evidence of bowel obstruction or  perforation. 2. Cardiomegaly with pulmonary vascular congestion, without overt edema. 3. Right basilar pulmonary nodule with adjacent fiducial marker, as previously characterized on CT dated 10/08/2023. Electronically signed  by: Camellia Candle MD 12/29/2023 09:39 AM EST RP Workstation: [id number]        Scheduled Meds:  acetaminophen   1,000 mg Oral Q6H   amLODipine   2.5 mg Oral Daily   arformoterol   15 mcg Nebulization BID   And   umeclidinium bromide   1 puff Inhalation Daily   carvedilol   6.25 mg Oral BID WC   gabapentin   100-200 mg Oral QHS   hydrALAZINE   100 mg Oral TID   insulin  aspart  0-9 Units Subcutaneous Q4H   isosorbide  mononitrate  30 mg Oral Daily   pantoprazole   40 mg Oral Daily   Continuous Infusions:  ampicillin  (OMNIPEN) IV Stopped (12/30/23 0615)   cefTRIAXone  (ROCEPHIN )  IV 0 g (12/29/23 2040)   metronidazole  500 mg (12/30/23 1700)   promethazine  (PHENERGAN ) injection (IM or IVPB) Stopped (12/29/23 2053)          Derryl Duval, MD [facility] 12/30/2023, 5:09 PM

## 2023-12-30 NOTE — Discharge Instructions (Addendum)
 Surgery Discharge Instructions  Activity  You are advised to go directly home from the hospital.  Resume light activity. No heavy lifting over 10 lbs or strenuous exercise.  Fluids and Diet Regular diet  Medications  If you have not had a bowel movement in 24 hours, take 2 tablespoons over the counter Milk of mag.             You May resume your blood thinners tomorrow (Aspirin , coumadin, or other).  You are being discharged with prescriptions for Opioid/Narcotic Medications: There are some specific considerations for these medications that you should know. Opioid Meds have risks & benefits. Addiction to these meds is always a concern with prolonged use Take medication only as directed Do not drive while taking narcotic pain medication Do not crush tablets or capsules Do not use a different container than medication was dispensed in Lock the container of medication in a cool, dry place out of reach of children and pets. Opioid medication can cause addiction Do not share with anyone else (this is a felony) Do not store medications for future use. Dispose of them properly.     Disposal:  Find a [place]  household drug take back site near you.  If you can't get to a drug take back site, use the recipe below as a last resort to dispose of expired, unused or unwanted drugs. Disposal  (Do not dispose chemotherapy drugs this way, talk to your prescribing doctor instead.) Step 1: Mix drugs (do not crush) with dirt, kitty litter, or used coffee grounds and add a small amount of water  to dissolve any solid medications. Step 2: Seal drugs in plastic bag. Step 3: Place plastic bag in trash. Step 4: Take prescription container and scratch out personal information, then recycle or throw away.  Operative Site  You have a liquid bandage over your incisions, this will begin to flake off in about a week. 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 Keep wound clean and dry. No baths or swimming. No lifting more than 10  pounds.  Contact Information: If you have questions or concerns, please call our office, [phone], Monday- Thursday 8AM-5PM and Friday 8AM-12Noon.  If it is after hours or on the weekend, please call [facility] Main Number, [phone], and ask to speak to the surgeon on call for Dr. Evonnie at [facility].   SPECIFIC COMPLICATIONS TO WATCH FOR: Inability to urinate Fever over 101? F by mouth Nausea and vomiting lasting longer than 24 hours. Pain not relieved by medication ordered Swelling around the operative site Increased redness, warmth, hardness, around operative area Numbness, tingling, or cold fingers or toes Blood -soaked dressing, (small amounts of oozing may be normal) Increasing and progressive drainage from surgical area or exam site   IMPORTANT INFORMATION: PAY CLOSE ATTENTION   PHYSICIAN DISCHARGE INSTRUCTIONS  Follow with Primary care provider  [facility]  and other consultants as instructed by your Hospitalist Physician  SEEK MEDICAL CARE OR RETURN TO EMERGENCY ROOM IF SYMPTOMS COME BACK, WORSEN OR NEW PROBLEM DEVELOPS   Please note: You were cared for by a hospitalist during your hospital stay. Every effort will be made to forward records to your primary care provider.  You can request that your primary care provider send for your hospital records if they have not received them.  Once you are discharged, your primary care physician will handle any further medical issues. Please note that NO REFILLS for any discharge medications will be authorized once you  are discharged, as it is imperative that you return to your primary care physician (or establish a relationship with a primary care physician if you do not have one) for your post hospital discharge needs so that they can reassess your need for medications and monitor your lab values.  Please get a complete blood count and chemistry panel checked by your Primary MD at your next visit, and  again as instructed by your Primary MD.  Get Medicines reviewed and adjusted: Please take all your medications with you for your next visit with your Primary MD  Laboratory/radiological data: Please request your Primary MD to go over all hospital tests and procedure/radiological results at the follow up, please ask your primary care provider to get all Hospital records sent to his/her office.  In some cases, they will be blood work, cultures and biopsy results pending at the time of your discharge. Please request that your primary care provider follow up on these results.  If you are diabetic, please bring your blood sugar readings with you to your follow up appointment with primary care.    Please call and make your follow up appointments as soon as possible.    Also Note the following: If you experience worsening of your admission symptoms, develop shortness of breath, life threatening emergency, suicidal or homicidal thoughts you must seek medical attention immediately by calling 911 or calling your MD immediately  if symptoms less severe.  You must read complete instructions/literature along with all the possible adverse reactions/side effects for all the Medicines you take and that have been prescribed to you. Take any new Medicines after you have completely understood and accpet all the possible adverse reactions/side effects.   Do not drive when taking Pain medications or sleeping medications (Benzodiazepines)  Do not take more than prescribed Pain, Sleep and Anxiety Medications. It is not advisable to combine anxiety,sleep and pain medications without talking with your primary care practitioner  Special Instructions: If you have smoked or chewed Tobacco  in the last 2 yrs please stop smoking, stop any regular Alcohol  and or any Recreational drug use.  Wear Seat belts while driving.  Do not drive if taking any narcotic, mind altering or controlled substances or recreational drugs or  alcohol.

## 2023-12-30 NOTE — Progress Notes (Signed)
 [facility]  Spoke with the patient's family in the consultation room.  I explained that she tolerated the procedure without difficulty.  She has dissolvable stitches under the skin with overlying skin glue.  This will flake off in 10 to 14 days.  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  All questions were answered to their expressed satisfaction.  Plan: -Admit patient to floor -Continue IV antibiotics (coverage for endocarditis should cover for acute cholecystitis) -AM blood work -Low fat diet -PRN pain control and antiemetics -Appreciate hospitalist recommendations  Dorothyann Brittle, DO [facility] [address] [place], [place] [address] [phone] (office)

## 2023-12-30 NOTE — Telephone Encounter (Signed)
 Patient Product/process Development Scientist completed.    The patient is insured through [facility]. Patient has Medicare and is not eligible for a copay card, but may be able to apply for patient assistance or Medicare RX Payment Plan (Patient Must reach out to their plan, if eligible for payment plan), if available.    Ran test claim for Incruse Ellipta  and the current 30 day co-pay is $0.00.   This test claim was processed through [facility]- copay amounts may vary at other pharmacies due to pharmacy/plan contracts, or as the patient moves through the different stages of their insurance plan.     Reyes Sharps, CPHT Pharmacy Technician Patient Advocate Specialist [facility] Patient Advocate Team Direct Number: [phone]  Fax: [phone]

## 2023-12-30 NOTE — Anesthesia Procedure Notes (Signed)
 Procedure Name: Intubation Date/Time: 12/30/2023 10:01 AM  Performed by: Elaine Delon CROME, CRNAPre-anesthesia Checklist: Patient identified, Emergency Drugs available, Suction available and Patient being monitored Patient Re-evaluated:Patient Re-evaluated prior to induction Oxygen  Delivery Method: Circle system utilized Preoxygenation: Pre-oxygenation with 100% oxygen  Induction Type: IV induction Ventilation: Mask ventilation without difficulty Laryngoscope Size: Mac and 3 Grade View: Grade I Tube type: Oral Tube size: 7.0 mm Number of attempts: 1 Airway Equipment and Method: Stylet Placement Confirmation: ETT inserted through vocal cords under direct vision, positive ETCO2 and breath sounds checked- equal and bilateral Secured at: 20 cm Tube secured with: Tape Dental Injury: Teeth and Oropharynx as per pre-operative assessment

## 2023-12-30 NOTE — Anesthesia Preprocedure Evaluation (Signed)
 Anesthesia Evaluation  Patient identified by MRN, date of birth, ID band Patient awake    Reviewed: Allergy & Precautions, H&P , NPO status , Patient's Chart, lab work & pertinent test results, reviewed documented beta blocker date and time   Airway Mallampati: II  TM Distance: >3 FB Neck ROM: full    Dental no notable dental hx.    Pulmonary asthma , COPD, former smoker   Pulmonary exam normal breath sounds clear to auscultation       Cardiovascular Exercise Tolerance: Good hypertension, + Peripheral Vascular Disease and + DOE  + Valvular Problems/Murmurs  Rhythm:regular Rate:Normal     Neuro/Psych negative neurological ROS  negative psych ROS   GI/Hepatic negative GI ROS, Neg liver ROS,,,  Endo/Other  diabetes    Renal/GU Renal disease  negative genitourinary   Musculoskeletal   Abdominal   Peds  Hematology  (+) Blood dyscrasia, anemia   Anesthesia Other Findings   Reproductive/Obstetrics negative OB ROS                              Anesthesia Physical Anesthesia Plan  ASA: 3  Anesthesia Plan: General ETT   Post-op Pain Management:    Induction:   PONV Risk Score and Plan: Ondansetron  and Scopolamine patch - Pre-op  Airway Management Planned:   Additional Equipment:   Intra-op Plan:   Post-operative Plan:   Informed Consent: I have reviewed the patients History and Physical, chart, labs and discussed the procedure including the risks, benefits and alternatives for the proposed anesthesia with the patient or authorized representative who has indicated his/her understanding and acceptance.     Dental Advisory Given  Plan Discussed with: CRNA  Anesthesia Plan Comments:         Anesthesia Quick Evaluation

## 2023-12-31 LAB — COMPREHENSIVE METABOLIC PANEL WITH GFR
ALT: 6 U/L (ref 0–44)
AST: 18 U/L (ref 15–41)
Albumin: 3 g/dL — ABNORMAL LOW (ref 3.5–5.0)
Alkaline Phosphatase: 90 U/L (ref 38–126)
Anion gap: 10 (ref 5–15)
BUN: 17 mg/dL (ref 8–23)
CO2: 25 mmol/L (ref 22–32)
Calcium: 8.3 mg/dL — ABNORMAL LOW (ref 8.9–10.3)
Chloride: 102 mmol/L (ref 98–111)
Creatinine, Ser: 2.09 mg/dL — ABNORMAL HIGH (ref 0.44–1.00)
GFR, Estimated: 23 mL/min — ABNORMAL LOW (ref >60)
Glucose, Bld: 74 mg/dL (ref 70–99)
Potassium: 4.3 mmol/L (ref 3.5–5.1)
Sodium: 138 mmol/L (ref 135–145)
Total Bilirubin: 0.3 mg/dL (ref 0.0–1.2)
Total Protein: 5.8 g/dL — ABNORMAL LOW (ref 6.5–8.1)

## 2023-12-31 LAB — CBC
HCT: 34 % — ABNORMAL LOW (ref 36.0–46.0)
Hemoglobin: 10.8 g/dL — ABNORMAL LOW (ref 12.0–15.0)
MCH: 27.8 pg (ref 26.0–34.0)
MCHC: 31.8 g/dL (ref 30.0–36.0)
MCV: 87.6 fL (ref 80.0–100.0)
Platelets: 164 K/uL (ref 150–400)
RBC: 3.88 MIL/uL (ref 3.87–5.11)
RDW: 16.7 % — ABNORMAL HIGH (ref 11.5–15.5)
WBC: 22.1 K/uL — ABNORMAL HIGH (ref 4.0–10.5)
nRBC: 0 % (ref 0.0–0.2)

## 2023-12-31 LAB — GLUCOSE, CAPILLARY
Glucose-Capillary: 100 mg/dL — ABNORMAL HIGH (ref 70–99)
Glucose-Capillary: 102 mg/dL — ABNORMAL HIGH (ref 70–99)
Glucose-Capillary: 116 mg/dL — ABNORMAL HIGH (ref 70–99)
Glucose-Capillary: 125 mg/dL — ABNORMAL HIGH (ref 70–99)
Glucose-Capillary: 82 mg/dL (ref 70–99)
Glucose-Capillary: 89 mg/dL (ref 70–99)
Glucose-Capillary: 97 mg/dL (ref 70–99)

## 2023-12-31 LAB — URINE CULTURE: Culture: NO GROWTH

## 2023-12-31 LAB — CULTURE, BLOOD (ROUTINE X 2)
Culture: NO GROWTH
Culture: NO GROWTH
Special Requests: ADEQUATE
Special Requests: ADEQUATE

## 2023-12-31 MED ORDER — LACTATED RINGERS IV SOLN: INTRAVENOUS | Status: DC

## 2023-12-31 MED ORDER — SODIUM CHLORIDE 0.9 % IV SOLN
2.0000 g | Freq: Three times a day (TID) | INTRAVENOUS | Status: DC
  Administered 2024-01-01 – 2024-01-04 (×9): 2 g via INTRAVENOUS
  Filled 2023-12-31 (×15): qty 2000

## 2023-12-31 MED ORDER — SODIUM CHLORIDE 0.9 % BOLUS PEDS: 1000.0000 mL | Freq: Once | INTRAVENOUS | Status: DC

## 2023-12-31 MED ORDER — BISACODYL 10 MG RE SUPP
10.0000 mg | Freq: Every day | RECTAL | Status: DC
  Administered 2023-12-31 – 2024-01-01 (×2): 10 mg via RECTAL
  Filled 2023-12-31 (×3): qty 1

## 2023-12-31 MED ORDER — POLYETHYLENE GLYCOL 3350 17 G PO PACK
17.0000 g | PACK | Freq: Every day | ORAL | Status: DC
  Administered 2023-12-31 – 2024-01-01 (×2): 17 g via ORAL
  Filled 2023-12-31 (×3): qty 1

## 2023-12-31 MED ORDER — SODIUM CHLORIDE 0.9 % IV BOLUS
1000.0000 mL | Freq: Once | INTRAVENOUS | Status: AC
  Administered 2023-12-31: 1000 mL via INTRAVENOUS

## 2023-12-31 MED ORDER — SENNOSIDES-DOCUSATE SODIUM 8.6-50 MG PO TABS
1.0000 | ORAL_TABLET | Freq: Two times a day (BID) | ORAL | Status: DC
  Administered 2023-12-31 – 2024-01-03 (×8): 1 via ORAL
  Filled 2023-12-31 (×8): qty 1

## 2023-12-31 NOTE — Progress Notes (Signed)
 [facility] Progress Note  1 Day Post-Op  Subjective: Patient seen and examined.  She is resting comfortably in bed.  She seems a little bit more confused this morning per nursing staff and family.  She denies abdominal pain, but is tender on examination.  She states that she has not had much of a diet and has not eaten much since the surgery.  She denies flatus, and is unsure of her last bowel movement.  Objective: Vital signs in last 24 hours: Temp:  [97.8 F (36.6 C)-98.9 F (37.2 C)] 97.8 F (36.6 C) (12/13 0732) Pulse Rate:  [61-86] 67 (12/13 1100) Resp:  [15-32] 25 (12/13 1100) BP: (94-161)/(31-107) 110/39 (12/13 1100) SpO2:  [90 %-100 %] 93 % (12/13 1100) Weight:  [89.3 kg] 89.3 kg (12/12 1323) Last BM Date : 12/29/23  Intake/Output from previous day: 12/12 0701 - 12/13 0700 In: 905.6 [I.V.:700; IV Piggyback:205.6] Out: 20 [Blood:20] Intake/Output this shift: Total I/O In: 866 [I.V.:26.3; IV Piggyback:839.7] Out: -   General appearance: alert, cooperative, and no distress GI: Abdomen soft, moderate distention, no percussion tenderness, tenderness to palpation in epigastrium and right upper quadrant; no rigidity, guarding, rebound tenderness  Lab Results:  Recent Labs    12/30/23 0439 12/31/23 0336  WBC 22.0* 22.1*  HGB 11.4* 10.8*  HCT 34.5* 34.0*  PLT 176 164   BMET Recent Labs    12/30/23 0439 12/31/23 0336  NA 137 138  K 4.1 4.3  CL 101 102  CO2 28 25  GLUCOSE 109* 74  BUN 11 17  CREATININE 1.25* 2.09*  CALCIUM  8.9 8.3*   PT/INR No results for input(s): LABPROT, INR in the last 72 hours.  Studies/Results: NM Hepatobiliary Liver Func Result Date: 12/30/2023 EXAM: NM HEPATOBILLARY SCAN 12/30/2023 08:53:30 AM TECHNIQUE: RADIOPHARMACEUTICAL: 5 mCi Tc-19m mebrofenin  (CHOLETEC ) injection. Dynamic images of the abdomen and pelvis were obtained in the anterior projection for 1 hour after intravenous administration of  radiopharmaceutical. The study was augmented with 3 mg of IV morphine  and imaging continued for at least 30 minutes. COMPARISON: CT abdomen and pelvis and abdomen ultrasound yesterday. CLINICAL HISTORY: 88 year old female. Abdominal pain with abnormal gallbladder on CT and ultrasound. FINDINGS: Initial good radiotracer uptake by the liver and clearance of the blood pool. Prompt radiotracer transit through the common bile duct, with excretion from the liver into the small bowel. Activity is visualized in the small bowel. Initially, no definite gallbladder activity occurred (although questionably on image 32 of the initial images - versus proximal duodenum). Following morphine  augmentation, there is rounded accumulation of radiotracer to the right of the common bile duct which persists over the entire second phase of the exam and is suggestive of partial gallbladder filling - possibly superimposed on gallbladder sludge noted by ultrasound. IMPRESSION: 1. Patent common bile duct with prompt radiotracer excretion from the liver. 2. Probable gallbladder activity and patent cystic duct, but only visible after morphine  augmentation. Electronically signed by: Helayne Hurst MD 12/30/2023 10:05 AM EST RP Workstation: [id number]   US  Abdomen Limited RUQ (LIVER/GB) Result Date: 12/29/2023 CLINICAL DATA:  Possible gallbladder inflammation on CT today. EXAM: ULTRASOUND ABDOMEN LIMITED RIGHT UPPER QUADRANT COMPARISON:  CT abdomen/pelvis earlier today. FINDINGS: Gallbladder: Mild prominence of the gallbladder measuring 13.1 cm in length. There is moderate gallbladder sludge without evidence of cholelithiasis. Slight gallbladder wall thickening measuring 3.7 mm. Positive sonographic Murphy sign. Scant amount pericholecystic fluid. Common bile duct: Diameter: 4.6 mm. Liver: 1.6 cm cyst over the right lobe. Somewhat coarse  parenchymal echogenicity compatible with a degree of steatosis. Portal vein is patent on color Doppler imaging  with normal direction of blood flow towards the liver. Other: None. IMPRESSION: 1. Mild gallbladder dilatation with moderate sludge and minimal associated wall thickening. Positive sonographic Murphy sign and scant amount of pericholecystic fluid. Findings may be seen with acute cholecystitis. 2. 1.6 cm right hepatic cyst. Electronically Signed   By: Toribio Agreste M.D.   On: 12/29/2023 15:59   CT ABDOMEN PELVIS W CONTRAST Result Date: 12/29/2023 CLINICAL DATA:  Acute abdominal pain and vomiting. 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 EXAM: CT ABDOMEN AND PELVIS WITH CONTRAST TECHNIQUE: Multidetector CT imaging of the abdomen and pelvis was performed using the standard protocol following bolus administration of intravenous contrast. RADIATION DOSE REDUCTION: This exam was performed according to the departmental dose-optimization program which includes automated exposure control, adjustment of the mA and/or kV according to patient size and/or use of iterative reconstruction technique. CONTRAST:  80mL OMNIPAQUE  IOHEXOL  300 MG/ML  SOLN COMPARISON:  12/21/2023, 11/25/2020 FINDINGS: Lower chest: Mild stable cardiomegaly. Mild calcified plaque over the descending thoracic aorta. Visualized lung bases demonstrate no acute process. Hepatobiliary: Several small liver cysts unchanged. Mild dilatation of the gallbladder with slight ill definition of the wall. No definite cholelithiasis. Biliary tree is otherwise unremarkable. Pancreas: Normal. Spleen: Normal. Adrenals/Urinary Tract: Right adrenal gland is normal. 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 Kidneys are normal in size. Several bilateral renal cysts are present and unchanged. There is a new hypoechoic area over the mid to upper pole lateral left renal cortex measuring approximately 1.6 x 2.7 cm with a smaller similar hypoechoic area over the mid to lower pole left renal cortex. These findings were not  present on previous exams as the larger upper pole hypoechoic area is new compared to 12/21/2023. Mild stranding of the left perinephric fat. Findings likely due to pyelonephritis. Ureters and bladder are unremarkable. Stomach/Bowel: Possible small sliding hiatal hernia. Stomach is otherwise unremarkable. Small bowel is normal. Significant diverticulosis throughout the sigmoid colon without active inflammation. Moderate diverticulosis over the descending colon. Subtle thin rimmed oval fatty structure adjacent the lateral aspect of the descending colon without significant change as this can be seen with the people appendagitis. Prior appendectomy. Vascular/Lymphatic: Moderate calcified plaque over the abdominal aorta which is normal in caliber. No adenopathy. Reproductive: Uterus and bilateral adnexa are unremarkable. Other: No free peritoneal air. Musculoskeletal: Posterior fusion hardware intact from L2-S1. IMPRESSION: 1. New hypoechoic areas over the mid to upper pole and mid to lower pole left renal cortex with mild stranding of the left perinephric fat. Findings likely due to pyelonephritis. 2. Mild dilatation of the gallbladder with slight ill definition of the wall. No definite cholelithiasis. Recommend right upper quadrant ultrasound if concern for acute cholecystitis. 3. 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 4. Colonic diverticulosis without active inflammation. Findings adjacent the distal descending colon unchanged from 12/21/2023, although new since 10/18/2023 which can be seen with epiploic appendagitis. 5. Aortic atherosclerosis. Aortic Atherosclerosis (ICD10-I70.0). Electronically Signed   By: Toribio Agreste M.D.   On: 12/29/2023 13:35   CT Head Wo Contrast Result Date: 12/29/2023 EXAM: CT Head Without Intravenous Contrast. CLINICAL HISTORY: Altered mental status, nontraumatic (Ped 0-17y); severe hypertension, vomiting. TECHNIQUE: Axial computed  tomography images of the head/brain without intravenous contrast. Dose reduction technique was used including one or more of the following: automated  exposure control, adjustment of mA and kV according to patient size, and/or iterative reconstruction. CONTRAST: Without. COMPARISON: CT head 07/03/2021. FINDINGS: BRAIN: No acute intraparenchymal hemorrhage. No mass lesion. No CT evidence for acute territorial infarct. No midline shift or extra-axial collection. Mild chronic microvascular ischemic changes and mild age related parenchymal volume loss. 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 Atherosclerosis of the carotid siphons. VENTRICLES: No hydrocephalus. ORBITS: Bilateral lens replacements. SINUSES AND MASTOIDS: Mucosal thickening in the left sphenoid sinus with thickening of the sinus wall suggestive of mucoperiosteal reaction compatible with chronic sinusitis. The remaining paranasal sinuses and mastoid air cells are clear. SOFT TISSUES: No significant facial or scalp soft tissue swelling evident. No radiopaque foreign body is seen. BONES: No acute skull fracture. IMPRESSION: 1. No acute findings. 2. 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 3. Mild chronic microvascular ischemic changes and mild age-related parenchymal volume loss. 4. Mucosal thickening in the left sphenoid sinus with thickening of the sinus wall and mucoperiosteal reaction compatible with chronic sinusitis. Electronically signed by: Donnice Mania MD 12/29/2023 01:03 PM EST RP Workstation: [id number]    Anti-infectives: Anti-infectives (From admission, onward)    Start     Dose/Rate Route Frequency Ordered Stop   12/30/23 0930  sodium chloride  0.9 % with cefTRIAXone  (ROCEPHIN ) ADS Med       Note to Pharmacy: Dannielle Railing S: cabinet override      12/30/23 0930 12/30/23 1008   12/30/23 0000  ampicillin  IVPB        8 g Intravenous Every 24 hours 12/30/23 1404 02/05/24 2359    12/30/23 0000  cefTRIAXone  (ROCEPHIN ) IVPB        2 g Intravenous Every 12 hours 12/30/23 1404 02/05/24 2359   12/29/23 2000  cefTRIAXone  (ROCEPHIN ) 2 g in sodium chloride  0.9 % 100 mL IVPB        2 g 200 mL/hr over 30 Minutes Intravenous Every 12 hours 12/29/23 1826     12/29/23 1930  ampicillin  (OMNIPEN) 2 g in sodium chloride  0.9 % 100 mL IVPB        2 g 300 mL/hr over 20 Minutes Intravenous Every 6 hours 12/29/23 1829     12/29/23 1800  metroNIDAZOLE  (FLAGYL ) IVPB 500 mg        500 mg 100 mL/hr over 60 Minutes Intravenous Every 12 hours 12/29/23 1737     12/29/23 1800  cefTRIAXone  (ROCEPHIN ) 2 g in sodium chloride  0.9 % 100 mL IVPB  Status:  Discontinued        2 g 200 mL/hr over 30 Minutes Intravenous Every 24 hours 12/29/23 1738 12/29/23 1826       Assessment/Plan:  Patient is an 88 year old female who is admitted to the hospital with concern for acute cholecystitis.  She is status post robotic assisted laparoscopic cholecystectomy on 12/12.  -Leukocytosis remains elevated at 22.1 from 22 yesterday.  Continue to monitor with daily labs.  Suspect that elevation may be partially be reactive from surgery -Continue IV antibiotics.  Patient currently receiving ampicillin , Rocephin , and Flagyl .  If patient's leukocytosis fails to improve tomorrow, I may consider transitioning to Zosyn or other stronger antibiotic -LFTs within normal limits -Okay for regular diet from general surgery standpoint.  If patient would prefer something less heavy, she may have clear liquids or full liquids as she tolerates -Will order bowel regimen for the patient, as I suspect she will develop a postoperative ileus given the significant amount of  inflammation at the time of surgery -PRN pain control and antiemetics -Appreciate hospitalist recommendations   LOS: 2 days    Earle Troiano A Terrance Usery 12/31/2023  Note: Portions of this report may have been transcribed using voice recognition software. Every  effort has been made to ensure accuracy; however, inadvertent computerized transcription errors may still be present.

## 2023-12-31 NOTE — Progress Notes (Signed)
 PROGRESS NOTE    Stephanie [name]  FMW:[id number] DOB: 1935-05-26 DOA: 12/29/2023 PCP: [facility]   Brief Narrative:    Stephanie [name] is a 88 y.o. female with medical history significant for congestive heart failure, CKD 4, diabetes mellitus, COPD, asthma, TAVR, paroxysmal atrial fibrillation, recent enterococcal bacteremia.   Patient was just discharged from the hospital-hospitalized 12/6 to 12/10 for sepsis secondary to enterococcal bacteremia, source of infection was unclear, hide urine cultures grew E. coli.  TTE was without vegetation, TEE not done -due to age, pulmonary function and obesity.  She was treated presumptively for prosthetic valve endocarditis. She was discharged home with home health to complete a 6-week course of IV antibiotic-continuous IV ampicillin  infusion and ceftriaxone  2 g daily, via PICC line.  Anticipated stop date was 02/05/2023.   Admitted with acute cholecystitis with complaints of nausea vomiting and abdominal pain.   Underwent lap chole 12/12.   Assessment & Plan:   Principal Problem:   Acute cholecystitis Active Problems:   Hypertensive urgency   Asthma   DM (diabetes mellitus) (HCC)   S/P TAVR (transcatheter aortic valve replacement)   COPD (chronic obstructive pulmonary disease) (HCC)   Chronic heart failure with preserved ejection fraction (HFpEF) (HCC)   Paroxysmal atrial fibrillation (HCC)   Prosthetic valve endocarditis   Nausea and vomiting   Pyelonephritis   Acute cholecystitis- patient is with abdominal pain nausea and vomiting.   ultrasound as well as HIDA scan suggestive of acute cholecystitis. Patient underwent laparoscopic cholecystectomy 12/12, uneventful. Patient has some abdominal distention postop day 1, leukocytosis persistent at 22, bowel sounds present patient passing gas. General surgery following, Will continue to monitor, IV Zofran , analgesics as needed  Hypertensive urgency-blood  pressures up to 219 systolic on presentation. Likely from acute illness.  Patient's antihypertensives were resumed yesterday.  Blood pressure this morning was low in 90s systolic. Also creatinine bumped to 2.0. Fluid bolus given. Will discontinue antihypertensives today and monitor closely.  Urinary retention: In-N-Out catheterization today with 375 cc out.  Will obtain renal ultrasound given bump in creatinine.  Enterococcal bacteremia/presumptive prosthetic valve endocarditis-TTE was without vegetation, TTE-deferred due to age, pulmonary function and obesity.  Was to complete 6-week course of IV ampicillin  infusion, and IV ceftriaxone , anticipated stop date-02/05/2023. - Continue IV ampicillin -pharmacy to dose, and ceftriaxone    Acute pyelonephritis on imaging- was just treated for E. coli UTI during recent hospitalization.  UA today not suggestive of UTI. ??Imaging findings lagging behind treatment. - Will follow urine cultures  Paroxysmal atrial fibrillation-not on anticoagulation due to history of GI bleed. - Beta-blocker held for now due to vomiting.     [id number] for severe aortic stenosis.   Asthma/COPD-stable. - Alb nebs as needed - Home regimen     DVT prophylaxis: SCDS for now  Code Status: FULL Family Communication: None at bedside Disposition Plan: ~ 2 days Consults called: Gen Surg Admission status: Inpt Stepdown I certify that at the point of admission it is my clinical judgment that the patient will require inpatient hospital care spanning beyond 2 midnights from the point of admission due to high intensity of service, high risk for further deterioration and high frequency of surveillance required.      Subjective:  Patient seen and examined at the bedside today.  She was borderline hypotensive in the morning today, sleepy with some confusion.  Later in the day had some urinary retention, In-N-Out catheterization done.  Blood pressure improved with fluid bolus.SABRA  Patient is afebrile  Objective: Vitals:   12/31/23 1300 12/31/23 1400 12/31/23 1600 12/31/23 1644  BP: (!) 120/54 (!) 132/50 (!) 141/58   Pulse: 83 85 89   Resp: (!) 24 15 20    Temp:    97.6 F (36.4 C)  TempSrc:    Oral  SpO2: 93% 91% 93%   Weight:      Height:        Intake/Output Summary (Last 24 hours) at 12/31/2023 1657 Last data filed at 12/31/2023 1644 Gross per 24 hour  Intake 2566.64 ml  Output 375 ml  Net 2191.64 ml   Filed Weights   12/29/23 0831 12/30/23 1323  Weight: 89 kg 89.3 kg    Examination:  General: Alert, oriented, comfortable Chest: Clear CVS: S1, S2, no murmur, regular rhythm Abdomen: mildly distended, nontender, bowel sounds minimal, Extremities: No edema   Data Reviewed: I have personally reviewed following labs and imaging studies  CBC: Recent Labs  Lab 12/26/23 0710 12/27/23 0417 12/29/23 0906 12/30/23 0439 12/31/23 0336  WBC 13.1* 11.3* 10.5 22.0* 22.1*  NEUTROABS  --   --  8.7*  --   --   HGB 11.5* 11.1* 12.5 11.4* 10.8*  HCT 35.0* 34.1* 38.5 34.5* 34.0*  MCV 85.8 85.7 85.9 85.6 87.6  PLT 125* 130* 154 176 164   Basic Metabolic Panel: Recent Labs  Lab 12/25/23 0512 12/26/23 0510 12/28/23 0502 12/29/23 0906 12/30/23 0439 12/31/23 0336  NA 133* 133* 136 134* 137 138  K 4.1 3.8 4.1 4.4 4.1 4.3  CL 98 98 100 97* 101 102  CO2 28 21* 27 22 28 25   GLUCOSE 116* 95 109* 157* 109* 74  BUN 16 21 15 11 11 17   CREATININE 1.65* 1.69* 1.42* 1.17* 1.25* 2.09*  CALCIUM  8.9 8.9 9.0 9.1 8.9 8.3*  PHOS 2.7  --   --   --   --   --    GFR: Estimated Creatinine Clearance: 21.3 mL/min (A) (by C-G formula based on SCr of 2.09 mg/dL (H)). Liver Function Tests: Recent Labs  Lab 12/25/23 0512 12/29/23 0906 12/30/23 0439 12/31/23 0336  AST  --  19 14* 18  ALT  --  6 5 6   ALKPHOS  --  103 91 90  BILITOT  --  0.3 0.4 0.3  PROT  --  6.8 6.4* 5.8*  ALBUMIN 3.4* 3.7 3.5 3.0*   Recent Labs  Lab 12/29/23 0906  LIPASE 29   No  results for input(s): AMMONIA in the last 168 hours. Coagulation Profile: No results for input(s): INR, PROTIME in the last 168 hours.  Cardiac Enzymes: Recent Labs  Lab 12/26/23 0510  CKTOTAL 45   BNP (last 3 results) No results for input(s): PROBNP in the last 8760 hours. HbA1C: No results for input(s): HGBA1C in the last 72 hours. CBG: Recent Labs  Lab 12/31/23 0010 12/31/23 0319 12/31/23 0730 12/31/23 1133 12/31/23 1623  GLUCAP 97 100* 89 102* 116*   Lipid Profile: No results for input(s): CHOL, HDL, LDLCALC, TRIG, CHOLHDL, LDLDIRECT in the last 72 hours. Thyroid  Function Tests: No results for input(s): TSH, T4TOTAL, FREET4, T3FREE, THYROIDAB in the last 72 hours. Anemia Panel: No results for input(s): VITAMINB12, FOLATE, FERRITIN, TIBC, IRON, RETICCTPCT in the last 72 hours. Sepsis Labs: No results for input(s): PROCALCITON, LATICACIDVEN in the last 168 hours.   Recent Results (from the past 240 hours)  Resp panel by RT-PCR (RSV, Flu A&B, Covid) Anterior Nasal Swab     Status:  None   Collection Time: 12/24/23  2:27 PM   Specimen: Anterior Nasal Swab  Result Value Ref Range Status   SARS Coronavirus 2 by RT PCR NEGATIVE NEGATIVE Final    Comment: (NOTE) SARS-CoV-2 target nucleic acids are NOT DETECTED.  The SARS-CoV-2 RNA is generally detectable in upper respiratory specimens during the acute phase of infection. The lowest concentration of SARS-CoV-2 viral copies this assay can detect is 138 copies/mL. A negative result does not preclude SARS-Cov-2 infection and should not be used as the sole basis for treatment or other patient management decisions. A negative result may occur with  improper specimen collection/handling, submission of specimen other than nasopharyngeal swab, presence of viral mutation(s) within the areas targeted by this assay, and inadequate number of viral copies(<138 copies/mL). A negative  result must be combined with clinical observations, patient history, and epidemiological information. The expected result is Negative.  Fact Sheet for Patients:  bloggercourse.com  Fact Sheet for Healthcare Providers:  seriousbroker.it  This test is no t yet approved or cleared by the [place]  FDA and  has been authorized for detection and/or diagnosis of SARS-CoV-2 by FDA under an Emergency Use Authorization (EUA). This EUA will remain  in effect (meaning this test can be used) for the duration of the COVID-19 declaration under Section 564(b)(1) of the Act, 21 U.S.C.section 360bbb-3(b)(1), unless the authorization is terminated  or revoked sooner.       Influenza A by PCR NEGATIVE NEGATIVE Final   Influenza B by PCR NEGATIVE NEGATIVE Final    Comment: (NOTE) The Xpert Xpress SARS-CoV-2/FLU/RSV plus assay is intended as an aid in the diagnosis of influenza from Nasopharyngeal swab specimens and should not be used as a sole basis for treatment. Nasal washings and aspirates are unacceptable for Xpert Xpress SARS-CoV-2/FLU/RSV testing.  Fact Sheet for Patients: bloggercourse.com  Fact Sheet for Healthcare Providers: seriousbroker.it  This test is not yet approved or cleared by the [place]  FDA and has been authorized for detection and/or diagnosis of SARS-CoV-2 by FDA under an Emergency Use Authorization (EUA). This EUA will remain in effect (meaning this test can be used) for the duration of the COVID-19 declaration under Section 564(b)(1) of the Act, 21 U.S.C. section 360bbb-3(b)(1), unless the authorization is terminated or revoked.     Resp Syncytial Virus by PCR NEGATIVE NEGATIVE Final    Comment: (NOTE) Fact Sheet for Patients: bloggercourse.com  Fact Sheet for Healthcare Providers: seriousbroker.it  This  test is not yet approved or cleared by the [place]  FDA and has been authorized for detection and/or diagnosis of SARS-CoV-2 by FDA under an Emergency Use Authorization (EUA). This EUA will remain in effect (meaning this test can be used) for the duration of the COVID-19 declaration under Section 564(b)(1) of the Act, 21 U.S.C. section 360bbb-3(b)(1), unless the authorization is terminated or revoked.  Performed at [facility], [address], [place], [place] [address]   Culture, blood (routine x 2)     Status: Abnormal   Collection Time: 12/24/23  3:42 PM   Specimen: BLOOD LEFT ARM  Result Value Ref Range Status   Specimen Description   Final    BLOOD LEFT ARM BOTTLES DRAWN AEROBIC AND ANAEROBIC Performed at [facility], [address], [place], [place] [address]    Special Requests   Final    Blood Culture results may not be optimal due to an inadequate volume of blood received in culture bottles Performed at [facility], [address]  [place], [place] [address]    Culture  Setup Time   Final    GRAM POSITIVE COCCI IN BOTH AEROBIC AND ANAEROBIC BOTTLES CRITICAL RESULT CALLED TO, READ BACK BY AND VERIFIED WITH: ANABEL,RN ON 12/25/23 AT 0430 BY PURDIE,J CRITICAL VALUE NOTED.  VALUE IS CONSISTENT WITH PREVIOUSLY REPORTED AND CALLED VALUE. Performed at [facility], [address], [place], [place] [address]    Culture (A)  Final    ENTEROCOCCUS FAECALIS SUSCEPTIBILITIES PERFORMED ON PREVIOUS CULTURE WITHIN THE LAST 5 DAYS. Performed at [facility], [address] [place], [place] [address]    Report Status 12/27/2023 FINAL  Final  Culture, blood (routine x 2)     Status: Abnormal   Collection Time: 12/24/23  4:09 PM   Specimen: Left Antecubital; Blood  Result Value Ref Range Status   Specimen Description   Final    LEFT ANTECUBITAL BOTTLES DRAWN AEROBIC AND ANAEROBIC Performed at [facility], [address], [place], [place] [address]    Special Requests    Final    Blood Culture results may not be optimal due to an inadequate volume of blood received in culture bottles Performed at [facility], [address] [place], [place] [address]    Culture  Setup Time   Final    GRAM POSITIVE COCCI IN BOTH AEROBIC AND ANAEROBIC BOTTLES CRITICAL RESULT CALLED TO, READ BACK BY AND VERIFIED WITH: ANABEL,RN ON 12/25/23 AT 0430 BY PURDIE,J CRITICAL RESULT CALLED TO, READ BACK BY AND VERIFIED WITH: RN ARMOND [id number] AT 1020 BY EC Performed at [facility], [address], [place], [place] [address]    Culture ENTEROCOCCUS FAECALIS (A)  Final   Report Status 12/27/2023 FINAL  Final   Organism ID, Bacteria ENTEROCOCCUS FAECALIS  Final      Susceptibility   Enterococcus faecalis - MIC*    AMPICILLIN  <=2 SENSITIVE Sensitive     VANCOMYCIN  1 SENSITIVE Sensitive     GENTAMICIN SYNERGY SENSITIVE Sensitive     * ENTEROCOCCUS FAECALIS  Blood Culture ID Panel (Reflexed)     Status: Abnormal   Collection Time: 12/24/23  4:09 PM  Result Value Ref Range Status   Enterococcus faecalis DETECTED (A) NOT DETECTED Final    Comment: CRITICAL RESULT CALLED TO, READ BACK BY AND VERIFIED WITH: RN ARMOND [id number] AT 1020 BY EC    Enterococcus Faecium NOT DETECTED NOT DETECTED Final   Listeria monocytogenes NOT DETECTED NOT DETECTED Final   Staphylococcus species NOT DETECTED NOT DETECTED Final   Staphylococcus aureus (BCID) NOT DETECTED NOT DETECTED Final   Staphylococcus epidermidis NOT DETECTED NOT DETECTED Final   Staphylococcus lugdunensis NOT DETECTED NOT DETECTED Final   Streptococcus species NOT DETECTED NOT DETECTED Final   Streptococcus agalactiae NOT DETECTED NOT DETECTED Final   Streptococcus pneumoniae NOT DETECTED NOT DETECTED Final   Streptococcus pyogenes NOT DETECTED NOT DETECTED Final   A.calcoaceticus-baumannii NOT DETECTED NOT DETECTED Final   Bacteroides fragilis NOT DETECTED NOT DETECTED Final   Enterobacterales NOT DETECTED NOT DETECTED  Final   Enterobacter cloacae complex NOT DETECTED NOT DETECTED Final   Escherichia coli NOT DETECTED NOT DETECTED Final   Klebsiella aerogenes NOT DETECTED NOT DETECTED Final   Klebsiella oxytoca NOT DETECTED NOT DETECTED Final   Klebsiella pneumoniae NOT DETECTED NOT DETECTED Final   Proteus species NOT DETECTED NOT DETECTED Final   Salmonella species NOT DETECTED NOT DETECTED Final   Serratia marcescens NOT DETECTED NOT DETECTED Final   Haemophilus influenzae NOT  DETECTED NOT DETECTED Final   Neisseria meningitidis NOT DETECTED NOT DETECTED Final   Pseudomonas aeruginosa NOT DETECTED NOT DETECTED Final   Stenotrophomonas maltophilia NOT DETECTED NOT DETECTED Final   Candida albicans NOT DETECTED NOT DETECTED Final   Candida auris NOT DETECTED NOT DETECTED Final   Candida glabrata NOT DETECTED NOT DETECTED Final   Candida krusei NOT DETECTED NOT DETECTED Final   Candida parapsilosis NOT DETECTED NOT DETECTED Final   Candida tropicalis NOT DETECTED NOT DETECTED Final   Cryptococcus neoformans/gattii NOT DETECTED NOT DETECTED Final   Vancomycin  resistance NOT DETECTED NOT DETECTED Final    Comment: Performed at [facility], [address], [place], [place] [address]  Urine Culture     Status: Abnormal   Collection Time: 12/24/23  4:25 PM   Specimen: Urine, Clean Catch  Result Value Ref Range Status   Specimen Description   Final    URINE, CLEAN CATCH Performed at [facility], [address], [place], [place] [address]    Special Requests   Final    NONE Performed at [facility], [address], [place], [place] [address]    Culture >=100,000 COLONIES/mL ESCHERICHIA COLI (A)  Final   Report Status 12/26/2023 FINAL  Final   Organism ID, Bacteria ESCHERICHIA COLI (A)  Final      Susceptibility   Escherichia coli - MIC*    AMPICILLIN  >=32 RESISTANT Resistant     CEFAZOLIN  (URINE) Value in next row Sensitive      2 SENSITIVEThis is a modified FDA-approved test that has been  validated and its performance characteristics determined by the reporting laboratory.  This laboratory is certified under the Clinical Laboratory Improvement Amendments CLIA as qualified to perform high complexity clinical laboratory testing.    CEFEPIME Value in next row Sensitive      2 SENSITIVEThis is a modified FDA-approved test that has been validated and its performance characteristics determined by the reporting laboratory.  This laboratory is certified under the Clinical Laboratory Improvement Amendments CLIA as qualified to perform high complexity clinical laboratory testing.    ERTAPENEM Value in next row Sensitive      2 SENSITIVEThis is a modified FDA-approved test that has been validated and its performance characteristics determined by the reporting laboratory.  This laboratory is certified under the Clinical Laboratory Improvement Amendments CLIA as qualified to perform high complexity clinical laboratory testing.    CEFTRIAXONE  Value in next row Sensitive      2 SENSITIVEThis is a modified FDA-approved test that has been validated and its performance characteristics determined by the reporting laboratory.  This laboratory is certified under the Clinical Laboratory Improvement Amendments CLIA as qualified to perform high complexity clinical laboratory testing.    CIPROFLOXACIN  Value in next row Intermediate      2 SENSITIVEThis is a modified FDA-approved test that has been validated and its performance characteristics determined by the reporting laboratory.  This laboratory is certified under the Clinical Laboratory Improvement Amendments CLIA as qualified to perform high complexity clinical laboratory testing.    GENTAMICIN Value in next row Sensitive      2 SENSITIVEThis is a modified FDA-approved test that has been validated and its performance characteristics determined by the reporting laboratory.  This laboratory is certified under the Clinical Laboratory Improvement Amendments CLIA  as qualified to perform high complexity clinical laboratory testing.    NITROFURANTOIN Value in next row Sensitive      2 SENSITIVEThis is a modified FDA-approved test that  has been validated and its performance characteristics determined by the reporting laboratory.  This laboratory is certified under the Clinical Laboratory Improvement Amendments CLIA as qualified to perform high complexity clinical laboratory testing.    TRIMETH/SULFA Value in next row Resistant      2 SENSITIVEThis is a modified FDA-approved test that has been validated and its performance characteristics determined by the reporting laboratory.  This laboratory is certified under the Clinical Laboratory Improvement Amendments CLIA as qualified to perform high complexity clinical laboratory testing.    AMPICILLIN /SULBACTAM Value in next row Sensitive      2 SENSITIVEThis is a modified FDA-approved test that has been validated and its performance characteristics determined by the reporting laboratory.  This laboratory is certified under the Clinical Laboratory Improvement Amendments CLIA as qualified to perform high complexity clinical laboratory testing.    PIP/TAZO Value in next row Sensitive      <=4 SENSITIVEThis is a modified FDA-approved test that has been validated and its performance characteristics determined by the reporting laboratory.  This laboratory is certified under the Clinical Laboratory Improvement Amendments CLIA as qualified to perform high complexity clinical laboratory testing.    MEROPENEM  Value in next row Sensitive      <=4 SENSITIVEThis is a modified FDA-approved test that has been validated and its performance characteristics determined by the reporting laboratory.  This laboratory is certified under the Clinical Laboratory Improvement Amendments CLIA as qualified to perform high complexity clinical laboratory testing.    * >=100,000 COLONIES/mL ESCHERICHIA COLI  Culture, blood (Routine X 2) w Reflex to ID  Panel     Status: None   Collection Time: 12/25/23 10:51 AM   Specimen: BLOOD  Result Value Ref Range Status   Specimen Description BLOOD LEFT ANTECUBITAL  Final   Special Requests   Final    BOTTLES DRAWN AEROBIC AND ANAEROBIC Blood Culture adequate volume   Culture   Final    NO GROWTH 5 DAYS Performed at [facility], [address], [place], [place] [address]    Report Status 12/30/2023 FINAL  Final  Culture, blood (Routine X 2) w Reflex to ID Panel     Status: None   Collection Time: 12/25/23 10:51 AM   Specimen: BLOOD  Result Value Ref Range Status   Specimen Description BLOOD BLOOD LEFT HAND  Final   Special Requests   Final    BOTTLES DRAWN AEROBIC AND ANAEROBIC Blood Culture adequate volume   Culture   Final    NO GROWTH 5 DAYS Performed at [facility], [address], [place], [place] [address]    Report Status 12/30/2023 FINAL  Final  Culture, blood (Routine X 2) w Reflex to ID Panel     Status: None   Collection Time: 12/26/23  3:05 PM   Specimen: BLOOD  Result Value Ref Range Status   Specimen Description BLOOD BLOOD LEFT ARM AEROBIC BOTTLE ONLY  Final   Special Requests Blood Culture adequate volume  Final   Culture   Final    NO GROWTH 5 DAYS Performed at [facility], [address] [place], [place] [address]    Report Status 12/31/2023 FINAL  Final  Culture, blood (Routine X 2) w Reflex to ID Panel     Status: None   Collection Time: 12/26/23  3:05 PM   Specimen: BLOOD  Result Value Ref Range Status   Specimen Description BLOOD AEROBIC BOTTLE ONLY LFOA  Final   Special Requests Blood Culture adequate volume  Final  Culture   Final    NO GROWTH 5 DAYS Performed at [facility], [address], [place], [place] [address]    Report Status 12/31/2023 FINAL  Final  Urine Culture (for pregnant, neutropenic or urologic patients or patients with an indwelling urinary catheter)     Status: None   Collection Time: 12/29/23 12:16 PM   Specimen: Urine, Clean Catch   Result Value Ref Range Status   Specimen Description   Final    URINE, CLEAN CATCH Performed at [facility], [address] [place], [place] [address]    Special Requests   Final    NONE Performed at [facility], [address] [place], [place] [address]    Culture   Final    NO GROWTH Performed at [facility], [address] [place], [place] [address]    Report Status 12/31/2023 FINAL  Final         Radiology Studies: NM Hepatobiliary Liver Func Result Date: 12/30/2023 EXAM: NM HEPATOBILLARY SCAN 12/30/2023 08:53:30 AM TECHNIQUE: RADIOPHARMACEUTICAL: 5 mCi Tc-51m mebrofenin  (CHOLETEC ) injection. Dynamic images of the abdomen and pelvis were obtained in the anterior projection for 1 hour after intravenous administration of radiopharmaceutical. The study was augmented with 3 mg of IV morphine  and imaging continued for at least 30 minutes. COMPARISON: CT abdomen and pelvis and abdomen ultrasound yesterday. CLINICAL HISTORY: 88 year old female. Abdominal pain with abnormal gallbladder on CT and ultrasound. FINDINGS: Initial good radiotracer uptake by the liver and clearance of the blood pool. Prompt radiotracer transit through the common bile duct, with excretion from the liver into the small bowel. Activity is visualized in the small bowel. Initially, no definite gallbladder activity occurred (although questionably on image 32 of the initial images - versus proximal duodenum). Following morphine  augmentation, there is rounded accumulation of radiotracer to the right of the common bile duct which persists over the entire second phase of the exam and is suggestive of partial gallbladder filling - possibly superimposed on gallbladder sludge noted by ultrasound. IMPRESSION: 1. Patent common bile duct with prompt radiotracer excretion from the liver. 2. Probable gallbladder activity and patent cystic duct, but only visible after morphine  augmentation. Electronically signed by: Helayne Hurst  MD 12/30/2023 10:05 AM EST RP Workstation: [id number]        Scheduled Meds:  acetaminophen   1,000 mg Oral Q6H   amLODipine   2.5 mg Oral Daily   arformoterol   15 mcg Nebulization BID   And   umeclidinium bromide   1 puff Inhalation Daily   bisacodyl   10 mg Rectal Daily   Chlorhexidine  Gluconate Cloth  6 each Topical Daily   gabapentin   100-200 mg Oral QHS   insulin  aspart  0-9 Units Subcutaneous Q4H   isosorbide  mononitrate  30 mg Oral Daily   pantoprazole   40 mg Oral Daily   polyethylene glycol  17 g Oral Daily   senna-docusate  1 tablet Oral BID   Continuous Infusions:  [START ON 01/01/2024] ampicillin  (OMNIPEN) IV     cefTRIAXone  (ROCEPHIN )  IV Stopped (12/31/23 0842)   lactated ringers  75 mL/hr at 12/31/23 1039   metronidazole  Stopped (12/31/23 9386)   promethazine  (PHENERGAN ) injection (IM or IVPB) Stopped (12/29/23 2053)          Derryl Duval, MD [facility] 12/31/2023, 4:57 PM

## 2023-12-31 NOTE — Progress Notes (Signed)
 Patient without any urine output. Bladder scanned, showed . Dr. Sigdel made aware. Verbal order to intermittent cath patient. In/out performed with out.

## 2024-01-01 ENCOUNTER — Inpatient Hospital Stay (HOSPITAL_COMMUNITY)

## 2024-01-01 DIAGNOSIS — K810 Acute cholecystitis: Secondary | ICD-10-CM | POA: Diagnosis not present

## 2024-01-01 LAB — GLUCOSE, CAPILLARY
Glucose-Capillary: 115 mg/dL — ABNORMAL HIGH (ref 70–99)
Glucose-Capillary: 122 mg/dL — ABNORMAL HIGH (ref 70–99)
Glucose-Capillary: 132 mg/dL — ABNORMAL HIGH (ref 70–99)
Glucose-Capillary: 82 mg/dL (ref 70–99)
Glucose-Capillary: 85 mg/dL (ref 70–99)
Glucose-Capillary: 97 mg/dL (ref 70–99)

## 2024-01-01 LAB — CBC
HCT: 31.6 % — ABNORMAL LOW (ref 36.0–46.0)
Hemoglobin: 10.2 g/dL — ABNORMAL LOW (ref 12.0–15.0)
MCH: 28.3 pg (ref 26.0–34.0)
MCHC: 32.3 g/dL (ref 30.0–36.0)
MCV: 87.8 fL (ref 80.0–100.0)
Platelets: 177 K/uL (ref 150–400)
RBC: 3.6 MIL/uL — ABNORMAL LOW (ref 3.87–5.11)
RDW: 17.1 % — ABNORMAL HIGH (ref 11.5–15.5)
WBC: 18.5 K/uL — ABNORMAL HIGH (ref 4.0–10.5)
nRBC: 0 % (ref 0.0–0.2)

## 2024-01-01 LAB — COMPREHENSIVE METABOLIC PANEL WITH GFR
ALT: 5 U/L (ref 0–44)
AST: 14 U/L — ABNORMAL LOW (ref 15–41)
Albumin: 2.8 g/dL — ABNORMAL LOW (ref 3.5–5.0)
Alkaline Phosphatase: 78 U/L (ref 38–126)
Anion gap: 9 (ref 5–15)
BUN: 25 mg/dL — ABNORMAL HIGH (ref 8–23)
CO2: 26 mmol/L (ref 22–32)
Calcium: 8.4 mg/dL — ABNORMAL LOW (ref 8.9–10.3)
Chloride: 102 mmol/L (ref 98–111)
Creatinine, Ser: 2.29 mg/dL — ABNORMAL HIGH (ref 0.44–1.00)
GFR, Estimated: 20 mL/min — ABNORMAL LOW (ref >60)
Glucose, Bld: 83 mg/dL (ref 70–99)
Potassium: 3.9 mmol/L (ref 3.5–5.1)
Sodium: 137 mmol/L (ref 135–145)
Total Bilirubin: 0.2 mg/dL (ref 0.0–1.2)
Total Protein: 5.7 g/dL — ABNORMAL LOW (ref 6.5–8.1)

## 2024-01-01 MED ORDER — NYSTATIN 100000 UNIT/GM EX POWD: Freq: Three times a day (TID) | CUTANEOUS | Status: DC | PRN

## 2024-01-01 MED ORDER — POTASSIUM CHLORIDE CRYS ER 20 MEQ PO TBCR
40.0000 meq | EXTENDED_RELEASE_TABLET | Freq: Once | ORAL | Status: AC
  Administered 2024-01-01: 40 meq via ORAL
  Filled 2024-01-01: qty 2

## 2024-01-01 MED ORDER — POTASSIUM CHLORIDE 10 MEQ/100ML IV SOLN
10.0000 meq | INTRAVENOUS | Status: DC
  Filled 2024-01-01: qty 100

## 2024-01-01 NOTE — Progress Notes (Signed)
 Patient with urinary retention yesterday and last night. Patient able to get up with 2 person assist and walker to bedside commode and was able to urinate.

## 2024-01-01 NOTE — Progress Notes (Signed)
 [facility] Progress Note  2 Days Post-Op  Subjective: Patient seen and examined.  She is resting comfortably in bed.  She still complains of some abdominal pain.  She was able to tolerate a little bit of her diet without nausea and vomiting.  She also confirms having a bowel movement overnight and is passing flatus.  She has had some urinary retention and has required 2 in and out catheterizations.  Objective: Vital signs in last 24 hours: Temp:  [97.4 F (36.3 C)-97.8 F (36.6 C)] 97.5 F (36.4 C) (12/14 0730) Pulse Rate:  [67-96] 81 (12/14 1000) Resp:  [15-27] 24 (12/14 1000) BP: (109-164)/(39-95) 125/52 (12/14 1000) SpO2:  [91 %-100 %] 92 % (12/14 1000) Last BM Date : 12/31/23  Intake/Output from previous day: 12/13 0701 - 12/14 0700 In: 2846 [P.O.:290; I.V.:561.3; IV Piggyback:1994.8] Out: 925 [Urine:925] Intake/Output this shift: Total I/O In: 1512.5 [I.V.:1212.5; IV Piggyback:300] Out: -   General appearance: alert, cooperative, and no distress GI: Abdomen soft, mild distention, no percussion tenderness, tenderness to palpation in right upper quadrant and lower abdomen; no rigidity, guarding, rebound tenderness  Lab Results:  Recent Labs    12/31/23 0336 01/01/24 0619  WBC 22.1* 18.5*  HGB 10.8* 10.2*  HCT 34.0* 31.6*  PLT 164 177   BMET Recent Labs    12/31/23 0336 01/01/24 0619  NA 138 137  K 4.3 3.9  CL 102 102  CO2 25 26  GLUCOSE 74 83  BUN 17 25*  CREATININE 2.09* 2.29*  CALCIUM  8.3* 8.4*   PT/INR No results for input(s): LABPROT, INR in the last 72 hours.  Studies/Results: No results found.  Anti-infectives: Anti-infectives (From admission, onward)    Start     Dose/Rate Route Frequency Ordered Stop   01/01/24 0600  ampicillin  (OMNIPEN) 2 g in sodium chloride  0.9 % 100 mL IVPB        2 g 300 mL/hr over 20 Minutes Intravenous Every 8 hours 12/31/23 1623     12/30/23 0930  sodium chloride  0.9 % with cefTRIAXone   (ROCEPHIN ) ADS Med       Note to Pharmacy: Dannielle Railing S: cabinet override      12/30/23 0930 12/30/23 1008   12/30/23 0000  ampicillin  IVPB        8 g Intravenous Every 24 hours 12/30/23 1404 02/05/24 2359   12/30/23 0000  cefTRIAXone  (ROCEPHIN ) IVPB        2 g Intravenous Every 12 hours 12/30/23 1404 02/05/24 2359   12/29/23 2000  cefTRIAXone  (ROCEPHIN ) 2 g in sodium chloride  0.9 % 100 mL IVPB        2 g 200 mL/hr over 30 Minutes Intravenous Every 12 hours 12/29/23 1826     12/29/23 1930  ampicillin  (OMNIPEN) 2 g in sodium chloride  0.9 % 100 mL IVPB  Status:  Discontinued        2 g 300 mL/hr over 20 Minutes Intravenous Every 6 hours 12/29/23 1829 12/31/23 1623   12/29/23 1800  metroNIDAZOLE  (FLAGYL ) IVPB 500 mg        500 mg 100 mL/hr over 60 Minutes Intravenous Every 12 hours 12/29/23 1737     12/29/23 1800  cefTRIAXone  (ROCEPHIN ) 2 g in sodium chloride  0.9 % 100 mL IVPB  Status:  Discontinued        2 g 200 mL/hr over 30 Minutes Intravenous Every 24 hours 12/29/23 1738 12/29/23 1826       Assessment/Plan:  Patient is an 88 year old female who  is admitted to the hospital with concern for acute cholecystitis.  She is status post robotic assisted laparoscopic cholecystectomy on 12/12.   -Leukocytosis slightly improved at at 18.5 from 22.1 yesterday.  Continue to monitor with daily labs -Continue IV antibiotics.  Patient currently receiving ampicillin , Rocephin , and Flagyl .  -LFTs within normal limits -Okay for regular diet from general surgery standpoint.  Patient may eat what ever sounds appetizing to her -Continue bowel regimen with MiraLAX , Senokot-S, and Dulcolax positive worries -PRN pain control and antiemetics -Appreciate hospitalist recommendations   LOS: 3 days    Norie Latendresse A Madisun Hargrove 01/01/2024  Note: Portions of this report may have been transcribed using voice recognition software. Every effort has been made to ensure accuracy; however, inadvertent  computerized transcription errors may still be present.

## 2024-01-01 NOTE — Progress Notes (Signed)
 PROGRESS NOTE    Stephanie [name]  FMW:[id number] DOB: 25-Jun-1935 DOA: 12/29/2023 PCP: [facility]   Brief Narrative:    Stephanie [name] is a 88 y.o. female with medical history significant for congestive heart failure, CKD 4, diabetes mellitus, COPD, asthma, TAVR, paroxysmal atrial fibrillation, recent enterococcal bacteremia.   Patient was just discharged from the hospital-hospitalized 12/6 to 12/10 for sepsis secondary to enterococcal bacteremia, source of infection was unclear, hide urine cultures grew E. coli.  TTE was without vegetation, TEE not done -due to age, pulmonary function and obesity.  She was treated presumptively for prosthetic valve endocarditis. She was discharged home with home health to complete a 6-week course of IV antibiotic-continuous IV ampicillin  infusion and ceftriaxone  2 g daily, via PICC line.  Anticipated stop date was 02/05/2023.   Admitted with acute cholecystitis with complaints of nausea vomiting and abdominal pain.   Underwent lap chole 12/12.  Hospital course notable for hypotension, leukocytosis and AKI   Assessment & Plan:   Principal Problem:   Acute cholecystitis Active Problems:   Hypertensive urgency   Asthma   DM (diabetes mellitus) (HCC)   S/P TAVR (transcatheter aortic valve replacement)   COPD (chronic obstructive pulmonary disease) (HCC)   Chronic heart failure with preserved ejection fraction (HFpEF) (HCC)   Paroxysmal atrial fibrillation (HCC)   Prosthetic valve endocarditis   Nausea and vomiting   Pyelonephritis   Acute cholecystitis- patient is with abdominal pain nausea and vomiting.   ultrasound as well as HIDA scan suggestive of acute cholecystitis. Patient underwent laparoscopic cholecystectomy 12/12, uneventful. Patient has some abdominal distention postop day 1, leukocytosis persistent at 22, bowel sounds present patient passing stool.  Diet advanced.  Abdominal distention.  On bowel  regimen.   General surgery following, Will continue to monitor, IV Zofran , analgesics as needed Leukocytosis 18K, trending down, will monitor  Hypertensive urgency-blood pressures up to 219 systolic on presentation. Likely from acute illness.  Patient's antihypertensives were resumed yesterday.  Blood pressure stop day 1 was low after restarting home antihypertensives.  These are on hold at this time. Will resume gradually.   Discharge associated with blood bump in creatinine to 2.0, this remains elevated at 2.1 despite IV fluids. Likely developed ATN.  Urinary retention: Required In-N-Out catheterization twice overnight.  Able to void today.  We will wait for renal ultrasound to check for hydronephrosis.  Will place Foley if recurrent retention.  AKI on CKD:  Creatinine 1.1-1.2.  Creatinine bumped to 2.1 postop day 1 coinciding with hypotension.  Some urinary retention.  Will check renal ultrasound.  Continue IV fluid, monitor intake and output  Enterococcal bacteremia/presumptive prosthetic valve endocarditis-TTE was without vegetation, TTE-deferred due to age, pulmonary function and obesity.  Was to complete 6-week course of IV ampicillin  infusion, and IV ceftriaxone , anticipated stop date-02/05/2023. - Continue IV ampicillin -pharmacy to dose, and ceftriaxone    Acute pyelonephritis on imaging- was just treated for E. coli UTI during recent hospitalization.  UA on admission not suggestive of UTI. ??Imaging findings lagging behind treatment. - Will follow urine cultures - Continue IV ceftriaxone   Paroxysmal atrial fibrillation-not on anticoagulation due to history of GI bleed. - Beta-blocker held for now due to vomiting.     [id number] for severe aortic stenosis.   Asthma/COPD-stable. - Alb nebs as needed - Home regimen     DVT prophylaxis: SCDS for now  Code Status: FULL Family Communication: None at bedside Disposition Plan: ~ 2 days Consults called: Gen  Surg Admission status:  Inpt Stepdown I certify that at the point of admission it is my clinical judgment that the patient will require inpatient hospital care spanning beyond 2 midnights from the point of admission due to high intensity of service, high risk for further deterioration and high frequency of surveillance required.      Subjective:  Patient seen and examined at the bedside today.  She is in her recliner, declines any complaints.  She feels like some bloating, required In-N-Out catheterization overnight for urinary retention.  Was able to void today.  Also having bowel movements.  No fever or chills.  Objective: Vitals:   01/01/24 0900 01/01/24 1000 01/01/24 1100 01/01/24 1130  BP: (!) 145/57 (!) 125/52 (!) 151/83   Pulse: 96 81 87   Resp: (!) 26 (!) 24 (!) 23   Temp:    98 F (36.7 C)  TempSrc:    Oral  SpO2: 92% 92% 93%   Weight:      Height:        Intake/Output Summary (Last 24 hours) at 01/01/2024 1341 Last data filed at 01/01/2024 1222 Gross per 24 hour  Intake 3469.34 ml  Output 925 ml  Net 2544.34 ml   Filed Weights   12/29/23 0831 12/30/23 1323  Weight: 89 kg 89.3 kg    Examination:  General: Alert, oriented, comfortable Chest: Clear CVS: S1, S2, no murmur, regular rhythm Abdomen: mildly distended, mild diffuse tenderness+, bowel sounds minimal, Extremities: No edema   Data Reviewed: I have personally reviewed following labs and imaging studies  CBC: Recent Labs  Lab 12/27/23 0417 12/29/23 0906 12/30/23 0439 12/31/23 0336 01/01/24 0619  WBC 11.3* 10.5 22.0* 22.1* 18.5*  NEUTROABS  --  8.7*  --   --   --   HGB 11.1* 12.5 11.4* 10.8* 10.2*  HCT 34.1* 38.5 34.5* 34.0* 31.6*  MCV 85.7 85.9 85.6 87.6 87.8  PLT 130* 154 176 164 177   Basic Metabolic Panel: Recent Labs  Lab 12/28/23 0502 12/29/23 0906 12/30/23 0439 12/31/23 0336 01/01/24 0619  NA 136 134* 137 138 137  K 4.1 4.4 4.1 4.3 3.9  CL 100 97* 101 102 102  CO2 27 22 28 25 26   GLUCOSE 109* 157*  109* 74 83  BUN 15 11 11 17  25*  CREATININE 1.42* 1.17* 1.25* 2.09* 2.29*  CALCIUM  9.0 9.1 8.9 8.3* 8.4*   GFR: Estimated Creatinine Clearance: 19.5 mL/min (A) (by C-G formula based on SCr of 2.29 mg/dL (H)). Liver Function Tests: Recent Labs  Lab 12/29/23 0906 12/30/23 0439 12/31/23 0336 01/01/24 0619  AST 19 14* 18 14*  ALT 6 5 6 5   ALKPHOS 103 91 90 78  BILITOT 0.3 0.4 0.3 0.2  PROT 6.8 6.4* 5.8* 5.7*  ALBUMIN 3.7 3.5 3.0* 2.8*   Recent Labs  Lab 12/29/23 0906  LIPASE 29   No results for input(s): AMMONIA in the last 168 hours. Coagulation Profile: No results for input(s): INR, PROTIME in the last 168 hours.  Cardiac Enzymes: Recent Labs  Lab 12/26/23 0510  CKTOTAL 45   BNP (last 3 results) No results for input(s): PROBNP in the last 8760 hours. HbA1C: No results for input(s): HGBA1C in the last 72 hours. CBG: Recent Labs  Lab 12/31/23 1926 12/31/23 2317 01/01/24 0332 01/01/24 0732 01/01/24 1114  GLUCAP 125* 82 82 85 122*   Lipid Profile: No results for input(s): CHOL, HDL, LDLCALC, TRIG, CHOLHDL, LDLDIRECT in the last 72 hours. Thyroid  Function Tests: No  results for input(s): TSH, T4TOTAL, FREET4, T3FREE, THYROIDAB in the last 72 hours. Anemia Panel: No results for input(s): VITAMINB12, FOLATE, FERRITIN, TIBC, IRON, RETICCTPCT in the last 72 hours. Sepsis Labs: No results for input(s): PROCALCITON, LATICACIDVEN in the last 168 hours.   Recent Results (from the past 240 hours)  Resp panel by RT-PCR (RSV, Flu A&B, Covid) Anterior Nasal Swab     Status: None   Collection Time: 12/24/23  2:27 PM   Specimen: Anterior Nasal Swab  Result Value Ref Range Status   SARS Coronavirus 2 by RT PCR NEGATIVE NEGATIVE Final    Comment: (NOTE) SARS-CoV-2 target nucleic acids are NOT DETECTED.  The SARS-CoV-2 RNA is generally detectable in upper respiratory specimens during the acute phase of infection. The  lowest concentration of SARS-CoV-2 viral copies this assay can detect is 138 copies/mL. A negative result does not preclude SARS-Cov-2 infection and should not be used as the sole basis for treatment or other patient management decisions. A negative result may occur with  improper specimen collection/handling, submission of specimen other than nasopharyngeal swab, presence of viral mutation(s) within the areas targeted by this assay, and inadequate number of viral copies(<138 copies/mL). A negative result must be combined with clinical observations, patient history, and epidemiological information. The expected result is Negative.  Fact Sheet for Patients:  bloggercourse.com  Fact Sheet for Healthcare Providers:  seriousbroker.it  This test is no t yet approved or cleared by the [place]  FDA and  has been authorized for detection and/or diagnosis of SARS-CoV-2 by FDA under an Emergency Use Authorization (EUA). This EUA will remain  in effect (meaning this test can be used) for the duration of the COVID-19 declaration under Section 564(b)(1) of the Act, 21 U.S.C.section 360bbb-3(b)(1), unless the authorization is terminated  or revoked sooner.       Influenza A by PCR NEGATIVE NEGATIVE Final   Influenza B by PCR NEGATIVE NEGATIVE Final    Comment: (NOTE) The Xpert Xpress SARS-CoV-2/FLU/RSV plus assay is intended as an aid in the diagnosis of influenza from Nasopharyngeal swab specimens and should not be used as a sole basis for treatment. Nasal washings and aspirates are unacceptable for Xpert Xpress SARS-CoV-2/FLU/RSV testing.  Fact Sheet for Patients: bloggercourse.com  Fact Sheet for Healthcare Providers: seriousbroker.it  This test is not yet approved or cleared by the [place]  FDA and has been authorized for detection and/or diagnosis of SARS-CoV-2 by FDA under  an Emergency Use Authorization (EUA). This EUA will remain in effect (meaning this test can be used) for the duration of the COVID-19 declaration under Section 564(b)(1) of the Act, 21 U.S.C. section 360bbb-3(b)(1), unless the authorization is terminated or revoked.     Resp Syncytial Virus by PCR NEGATIVE NEGATIVE Final    Comment: (NOTE) Fact Sheet for Patients: bloggercourse.com  Fact Sheet for Healthcare Providers: seriousbroker.it  This test is not yet approved or cleared by the [place]  FDA and has been authorized for detection and/or diagnosis of SARS-CoV-2 by FDA under an Emergency Use Authorization (EUA). This EUA will remain in effect (meaning this test can be used) for the duration of the COVID-19 declaration under Section 564(b)(1) of the Act, 21 U.S.C. section 360bbb-3(b)(1), unless the authorization is terminated or revoked.  Performed at [facility], [address] [place], [place] [address]   Culture, blood (routine x 2)     Status: Abnormal   Collection Time: 12/24/23  3:42 PM   Specimen: BLOOD LEFT ARM  Result Value  Ref Range Status   Specimen Description   Final    BLOOD LEFT ARM BOTTLES DRAWN AEROBIC AND ANAEROBIC Performed at [facility], [address], [place], [place] [address]    Special Requests   Final    Blood Culture results may not be optimal due to an inadequate volume of blood received in culture bottles Performed at [facility], [address] [place], [place] [address]    Culture  Setup Time   Final    GRAM POSITIVE COCCI IN BOTH AEROBIC AND ANAEROBIC BOTTLES CRITICAL RESULT CALLED TO, READ BACK BY AND VERIFIED WITH: ANABEL,RN ON 12/25/23 AT 0430 BY PURDIE,J CRITICAL VALUE NOTED.  VALUE IS CONSISTENT WITH PREVIOUSLY REPORTED AND CALLED VALUE. Performed at [facility], [address], [place], [place] [address]    Culture (A)  Final    ENTEROCOCCUS FAECALIS SUSCEPTIBILITIES PERFORMED  ON PREVIOUS CULTURE WITHIN THE LAST 5 DAYS. Performed at [facility], [address] [place], [place] [address]    Report Status 12/27/2023 FINAL  Final  Culture, blood (routine x 2)     Status: Abnormal   Collection Time: 12/24/23  4:09 PM   Specimen: Left Antecubital; Blood  Result Value Ref Range Status   Specimen Description   Final    LEFT ANTECUBITAL BOTTLES DRAWN AEROBIC AND ANAEROBIC Performed at [facility], [address], [place], [place] [address]    Special Requests   Final    Blood Culture results may not be optimal due to an inadequate volume of blood received in culture bottles Performed at [facility], [address] [place], [place] [address]    Culture  Setup Time   Final    GRAM POSITIVE COCCI IN BOTH AEROBIC AND ANAEROBIC BOTTLES CRITICAL RESULT CALLED TO, READ BACK BY AND VERIFIED WITH: ANABEL,RN ON 12/25/23 AT 0430 BY PURDIE,J CRITICAL RESULT CALLED TO, READ BACK BY AND VERIFIED WITH: RN ARMOND [id number] AT 1020 BY EC Performed at [facility], [address], [place], [place] [address]    Culture ENTEROCOCCUS FAECALIS (A)  Final   Report Status 12/27/2023 FINAL  Final   Organism ID, Bacteria ENTEROCOCCUS FAECALIS  Final      Susceptibility   Enterococcus faecalis - MIC*    AMPICILLIN  <=2 SENSITIVE Sensitive     VANCOMYCIN  1 SENSITIVE Sensitive     GENTAMICIN SYNERGY SENSITIVE Sensitive     * ENTEROCOCCUS FAECALIS  Blood Culture ID Panel (Reflexed)     Status: Abnormal   Collection Time: 12/24/23  4:09 PM  Result Value Ref Range Status   Enterococcus faecalis DETECTED (A) NOT DETECTED Final    Comment: CRITICAL RESULT CALLED TO, READ BACK BY AND VERIFIED WITH: RN ARMOND [id number] AT 1020 BY EC    Enterococcus Faecium NOT DETECTED NOT DETECTED Final   Listeria monocytogenes NOT DETECTED NOT DETECTED Final   Staphylococcus species NOT DETECTED NOT DETECTED Final   Staphylococcus aureus (BCID) NOT DETECTED NOT DETECTED Final    Staphylococcus epidermidis NOT DETECTED NOT DETECTED Final   Staphylococcus lugdunensis NOT DETECTED NOT DETECTED Final   Streptococcus species NOT DETECTED NOT DETECTED Final   Streptococcus agalactiae NOT DETECTED NOT DETECTED Final   Streptococcus pneumoniae NOT DETECTED NOT DETECTED Final   Streptococcus pyogenes NOT DETECTED NOT DETECTED Final   A.calcoaceticus-baumannii NOT DETECTED NOT DETECTED Final   Bacteroides fragilis NOT DETECTED NOT DETECTED Final   Enterobacterales NOT DETECTED NOT DETECTED Final   Enterobacter cloacae complex NOT DETECTED NOT DETECTED  Final   Escherichia coli NOT DETECTED NOT DETECTED Final   Klebsiella aerogenes NOT DETECTED NOT DETECTED Final   Klebsiella oxytoca NOT DETECTED NOT DETECTED Final   Klebsiella pneumoniae NOT DETECTED NOT DETECTED Final   Proteus species NOT DETECTED NOT DETECTED Final   Salmonella species NOT DETECTED NOT DETECTED Final   Serratia marcescens NOT DETECTED NOT DETECTED Final   Haemophilus influenzae NOT DETECTED NOT DETECTED Final   Neisseria meningitidis NOT DETECTED NOT DETECTED Final   Pseudomonas aeruginosa NOT DETECTED NOT DETECTED Final   Stenotrophomonas maltophilia NOT DETECTED NOT DETECTED Final   Candida albicans NOT DETECTED NOT DETECTED Final   Candida auris NOT DETECTED NOT DETECTED Final   Candida glabrata NOT DETECTED NOT DETECTED Final   Candida krusei NOT DETECTED NOT DETECTED Final   Candida parapsilosis NOT DETECTED NOT DETECTED Final   Candida tropicalis NOT DETECTED NOT DETECTED Final   Cryptococcus neoformans/gattii NOT DETECTED NOT DETECTED Final   Vancomycin  resistance NOT DETECTED NOT DETECTED Final    Comment: Performed at [facility], [address], [place], [place] [address]  Urine Culture     Status: Abnormal   Collection Time: 12/24/23  4:25 PM   Specimen: Urine, Clean Catch  Result Value Ref Range Status   Specimen Description   Final    URINE, CLEAN CATCH Performed at [facility], [address] [place], [place] [address]    Special Requests   Final    NONE Performed at [facility], [address], [place], [place] [address]    Culture >=100,000 COLONIES/mL ESCHERICHIA COLI (A)  Final   Report Status 12/26/2023 FINAL  Final   Organism ID, Bacteria ESCHERICHIA COLI (A)  Final      Susceptibility   Escherichia coli - MIC*    AMPICILLIN  >=32 RESISTANT Resistant     CEFAZOLIN  (URINE) Value in next row Sensitive      2 SENSITIVEThis is a modified FDA-approved test that has been validated and its performance characteristics determined by the reporting laboratory.  This laboratory is certified under the Clinical Laboratory Improvement Amendments CLIA as qualified to perform high complexity clinical laboratory testing.    CEFEPIME Value in next row Sensitive      2 SENSITIVEThis is a modified FDA-approved test that has been validated and its performance characteristics determined by the reporting laboratory.  This laboratory is certified under the Clinical Laboratory Improvement Amendments CLIA as qualified to perform high complexity clinical laboratory testing.    ERTAPENEM Value in next row Sensitive      2 SENSITIVEThis is a modified FDA-approved test that has been validated and its performance characteristics determined by the reporting laboratory.  This laboratory is certified under the Clinical Laboratory Improvement Amendments CLIA as qualified to perform high complexity clinical laboratory testing.    CEFTRIAXONE  Value in next row Sensitive      2 SENSITIVEThis is a modified FDA-approved test that has been validated and its performance characteristics determined by the reporting laboratory.  This laboratory is certified under the Clinical Laboratory Improvement Amendments CLIA as qualified to perform high complexity clinical laboratory testing.    CIPROFLOXACIN  Value in next row Intermediate      2 SENSITIVEThis is a modified FDA-approved test that has been validated  and its performance characteristics determined by the reporting laboratory.  This laboratory is certified under the Clinical Laboratory Improvement Amendments CLIA as qualified to perform high complexity clinical laboratory testing.    GENTAMICIN Value in next row  Sensitive      2 SENSITIVEThis is a modified FDA-approved test that has been validated and its performance characteristics determined by the reporting laboratory.  This laboratory is certified under the Clinical Laboratory Improvement Amendments CLIA as qualified to perform high complexity clinical laboratory testing.    NITROFURANTOIN Value in next row Sensitive      2 SENSITIVEThis is a modified FDA-approved test that has been validated and its performance characteristics determined by the reporting laboratory.  This laboratory is certified under the Clinical Laboratory Improvement Amendments CLIA as qualified to perform high complexity clinical laboratory testing.    TRIMETH/SULFA Value in next row Resistant      2 SENSITIVEThis is a modified FDA-approved test that has been validated and its performance characteristics determined by the reporting laboratory.  This laboratory is certified under the Clinical Laboratory Improvement Amendments CLIA as qualified to perform high complexity clinical laboratory testing.    AMPICILLIN /SULBACTAM Value in next row Sensitive      2 SENSITIVEThis is a modified FDA-approved test that has been validated and its performance characteristics determined by the reporting laboratory.  This laboratory is certified under the Clinical Laboratory Improvement Amendments CLIA as qualified to perform high complexity clinical laboratory testing.    PIP/TAZO Value in next row Sensitive      <=4 SENSITIVEThis is a modified FDA-approved test that has been validated and its performance characteristics determined by the reporting laboratory.  This laboratory is certified under the Clinical Laboratory Improvement Amendments  CLIA as qualified to perform high complexity clinical laboratory testing.    MEROPENEM  Value in next row Sensitive      <=4 SENSITIVEThis is a modified FDA-approved test that has been validated and its performance characteristics determined by the reporting laboratory.  This laboratory is certified under the Clinical Laboratory Improvement Amendments CLIA as qualified to perform high complexity clinical laboratory testing.    * >=100,000 COLONIES/mL ESCHERICHIA COLI  Culture, blood (Routine X 2) w Reflex to ID Panel     Status: None   Collection Time: 12/25/23 10:51 AM   Specimen: BLOOD  Result Value Ref Range Status   Specimen Description BLOOD LEFT ANTECUBITAL  Final   Special Requests   Final    BOTTLES DRAWN AEROBIC AND ANAEROBIC Blood Culture adequate volume   Culture   Final    NO GROWTH 5 DAYS Performed at [facility], [address], [place], [place] [address]    Report Status 12/30/2023 FINAL  Final  Culture, blood (Routine X 2) w Reflex to ID Panel     Status: None   Collection Time: 12/25/23 10:51 AM   Specimen: BLOOD  Result Value Ref Range Status   Specimen Description BLOOD BLOOD LEFT HAND  Final   Special Requests   Final    BOTTLES DRAWN AEROBIC AND ANAEROBIC Blood Culture adequate volume   Culture   Final    NO GROWTH 5 DAYS Performed at [facility], [address] [place], [place] [address]    Report Status 12/30/2023 FINAL  Final  Culture, blood (Routine X 2) w Reflex to ID Panel     Status: None   Collection Time: 12/26/23  3:05 PM   Specimen: BLOOD  Result Value Ref Range Status   Specimen Description BLOOD BLOOD LEFT ARM AEROBIC BOTTLE ONLY  Final   Special Requests Blood Culture adequate volume  Final   Culture   Final    NO GROWTH 5 DAYS Performed at [facility], [address], [place], [place] [address]    Report Status 12/31/2023 FINAL  Final  Culture, blood (Routine X 2) w Reflex to ID Panel     Status: None   Collection Time: 12/26/23  3:05 PM    Specimen: BLOOD  Result Value Ref Range Status   Specimen Description BLOOD AEROBIC BOTTLE ONLY LFOA  Final   Special Requests Blood Culture adequate volume  Final   Culture   Final    NO GROWTH 5 DAYS Performed at [facility], [address], [place], [place] [address]    Report Status 12/31/2023 FINAL  Final  Urine Culture (for pregnant, neutropenic or urologic patients or patients with an indwelling urinary catheter)     Status: None   Collection Time: 12/29/23 12:16 PM   Specimen: Urine, Clean Catch  Result Value Ref Range Status   Specimen Description   Final    URINE, CLEAN CATCH Performed at [facility], [address], [place], [place] [address]    Special Requests   Final    NONE Performed at [facility], [address] [place], [place] [address]    Culture   Final    NO GROWTH Performed at [facility], [address] [place], [place] [address]    Report Status 12/31/2023 FINAL  Final         Radiology Studies: No results found.       Scheduled Meds:  acetaminophen   1,000 mg Oral Q6H   arformoterol   15 mcg Nebulization BID   And   umeclidinium bromide   1 puff Inhalation Daily   bisacodyl   10 mg Rectal Daily   Chlorhexidine  Gluconate Cloth  6 each Topical Daily   insulin  aspart  0-9 Units Subcutaneous Q4H   isosorbide  mononitrate  30 mg Oral Daily   pantoprazole   40 mg Oral Daily   polyethylene glycol  17 g Oral Daily   senna-docusate  1 tablet Oral BID   Continuous Infusions:  ampicillin  (OMNIPEN) IV 300 mL/hr at 01/01/24 1222   cefTRIAXone  (ROCEPHIN )  IV Stopped (01/01/24 0904)   metronidazole  Stopped (01/01/24 0719)   promethazine  (PHENERGAN ) injection (IM or IVPB) Stopped (12/29/23 2053)          Derryl Duval, MD [facility] 01/01/2024, 1:41 PM

## 2024-01-02 LAB — COMPREHENSIVE METABOLIC PANEL WITH GFR
ALT: <5 U/L (ref 0–44)
AST: 14 U/L — ABNORMAL LOW (ref 15–41)
Albumin: 3 g/dL — ABNORMAL LOW (ref 3.5–5.0)
Alkaline Phosphatase: 89 U/L (ref 38–126)
Anion gap: 9 (ref 5–15)
BUN: 27 mg/dL — ABNORMAL HIGH (ref 8–23)
CO2: 25 mmol/L (ref 22–32)
Calcium: 8.5 mg/dL — ABNORMAL LOW (ref 8.9–10.3)
Chloride: 102 mmol/L (ref 98–111)
Creatinine, Ser: 2.36 mg/dL — ABNORMAL HIGH (ref 0.44–1.00)
GFR, Estimated: 19 mL/min — ABNORMAL LOW (ref >60)
Glucose, Bld: 85 mg/dL (ref 70–99)
Potassium: 4.3 mmol/L (ref 3.5–5.1)
Sodium: 137 mmol/L (ref 135–145)
Total Bilirubin: 0.2 mg/dL (ref 0.0–1.2)
Total Protein: 5.7 g/dL — ABNORMAL LOW (ref 6.5–8.1)

## 2024-01-02 LAB — CBC WITH DIFFERENTIAL/PLATELET
Abs Immature Granulocytes: 0.19 K/uL — ABNORMAL HIGH (ref 0.00–0.07)
Basophils Absolute: 0 K/uL (ref 0.0–0.1)
Basophils Relative: 0 %
Eosinophils Absolute: 0.3 K/uL (ref 0.0–0.5)
Eosinophils Relative: 2 %
HCT: 31.9 % — ABNORMAL LOW (ref 36.0–46.0)
Hemoglobin: 10.1 g/dL — ABNORMAL LOW (ref 12.0–15.0)
Immature Granulocytes: 1 %
Lymphocytes Relative: 6 %
Lymphs Abs: 0.9 K/uL (ref 0.7–4.0)
MCH: 27.7 pg (ref 26.0–34.0)
MCHC: 31.7 g/dL (ref 30.0–36.0)
MCV: 87.4 fL (ref 80.0–100.0)
Monocytes Absolute: 1 K/uL (ref 0.1–1.0)
Monocytes Relative: 6 %
Neutro Abs: 13.4 K/uL — ABNORMAL HIGH (ref 1.7–7.7)
Neutrophils Relative %: 85 %
Platelets: 208 K/uL (ref 150–400)
RBC: 3.65 MIL/uL — ABNORMAL LOW (ref 3.87–5.11)
RDW: 17.3 % — ABNORMAL HIGH (ref 11.5–15.5)
WBC: 15.8 K/uL — ABNORMAL HIGH (ref 4.0–10.5)
nRBC: 0 % (ref 0.0–0.2)

## 2024-01-02 LAB — OSMOLALITY, URINE: Osmolality, Ur: 482 mosm/kg (ref 300–900)

## 2024-01-02 LAB — GLUCOSE, CAPILLARY
Glucose-Capillary: 100 mg/dL — ABNORMAL HIGH (ref 70–99)
Glucose-Capillary: 109 mg/dL — ABNORMAL HIGH (ref 70–99)
Glucose-Capillary: 131 mg/dL — ABNORMAL HIGH (ref 70–99)
Glucose-Capillary: 131 mg/dL — ABNORMAL HIGH (ref 70–99)
Glucose-Capillary: 98 mg/dL (ref 70–99)

## 2024-01-02 LAB — SODIUM, URINE, RANDOM: Sodium, Ur: 59 mmol/L

## 2024-01-02 MED ORDER — CARVEDILOL 3.125 MG PO TABS
6.2500 mg | ORAL_TABLET | Freq: Two times a day (BID) | ORAL | Status: DC
  Administered 2024-01-02 – 2024-01-04 (×5): 6.25 mg via ORAL
  Filled 2024-01-02 (×5): qty 2

## 2024-01-02 MED ORDER — AMLODIPINE BESYLATE 5 MG PO TABS
2.5000 mg | ORAL_TABLET | Freq: Every day | ORAL | Status: DC
  Administered 2024-01-02: 10:00:00 2.5 mg via ORAL
  Filled 2024-01-02 (×2): qty 1

## 2024-01-02 MED ORDER — LACTATED RINGERS IV SOLN: INTRAVENOUS | Status: AC

## 2024-01-02 MED ORDER — HYDRALAZINE HCL 50 MG PO TABS
100.0000 mg | ORAL_TABLET | Freq: Three times a day (TID) | ORAL | Status: DC
  Administered 2024-01-02 – 2024-01-04 (×7): 100 mg via ORAL
  Filled 2024-01-02 (×7): qty 2

## 2024-01-02 NOTE — Progress Notes (Signed)
 Assisted patient to [facility].  Sats dropped to 83% with the activity.  Placed on 2L nasal cannula, sats recovered to 96-98% at rest.

## 2024-01-02 NOTE — Progress Notes (Signed)
 [facility] Progress Note  3 Days Post-Op  Subjective: Patient seen and examined.  She is resting in bed eating breakfast, though she is short of breath.  She got up to use the bedside commode earlier this morning, and has been short of breath since.  She is currently wearing 2 L nasal cannula and is satting at 100%.  She complains of abdominal pain which is unchanged from yesterday.  She is tolerating a diet without nausea and vomiting.  She had 2 bowel movements yesterday.  Objective: Vital signs in last 24 hours: Temp:  [97.7 F (36.5 C)-98.4 F (36.9 C)] 98.1 F (36.7 C) (12/15 0311) Pulse Rate:  [77-96] 89 (12/15 0613) Resp:  [18-28] 22 (12/15 0613) BP: (125-172)/(52-97) 170/79 (12/15 0400) SpO2:  [91 %-100 %] 91 % (12/15 0613) Last BM Date : 01/01/24  Intake/Output from previous day: 12/14 0701 - 12/15 0700 In: 2167.5 [I.V.:1367.5; IV Piggyback:800] Out: 450 [Urine:450] Intake/Output this shift: No intake/output data recorded.  General appearance: alert, cooperative, and mild distress GI: Abdomen soft, mild distention, no percussion tenderness, minimal tenderness to palpation; no rigidity, guarding, rebound tenderness; laparoscopic incision sites C/D/I with skin glue in place  Lab Results:  Recent Labs    01/01/24 0619 01/02/24 0418  WBC 18.5* 15.8*  HGB 10.2* 10.1*  HCT 31.6* 31.9*  PLT 177 208   BMET Recent Labs    01/01/24 0619 01/02/24 0418  NA 137 137  K 3.9 4.3  CL 102 102  CO2 26 25  GLUCOSE 83 85  BUN 25* 27*  CREATININE 2.29* 2.36*  CALCIUM  8.4* 8.5*   PT/INR No results for input(s): LABPROT, INR in the last 72 hours.  Studies/Results: US  RENAL Result Date: 01/01/2024 CLINICAL DATA:  13870 Urinary retention 13870 EXAM: RENAL / URINARY TRACT ULTRASOUND COMPLETE COMPARISON:  December 29, 2023, November 23 24 FINDINGS: Right Kidney: Renal measurements: 11.2 x 4.3 x 4.0 cm = volume: 101 mL. Echogenicity within normal limits.  No hydronephrosis visualized. Scattered simple and mildly complicated cysts are noted measuring up to 3.7 cm (for which no dedicated imaging follow-up is recommended). Left Kidney: Renal measurements: 10.0 x 5.1 x 5.3 cm = volume: 142 mL. Echogenicity within normal limits. No hydronephrosis visualized. There is a hypoechoic to near anechoic mass in the interpolar kidney measures 14 x 12 x 15 mm cyst, similar to 2024. Bladder: Completely decompressed and not visualized secondary to shadowing bowel gas. Other: None. IMPRESSION: 1. No hydronephrosis. 2. There is a 15 mm hypoechoic to near anechoic mass in the interpolar left kidney. This is favored to reflect a mildly complicated cyst. Recommend follow-up ultrasound in 1 year to assess for stability. Electronically Signed   By: Corean Salter M.D.   On: 01/01/2024 14:44    Anti-infectives: Anti-infectives (From admission, onward)    Start     Dose/Rate Route Frequency Ordered Stop   01/01/24 0600  ampicillin  (OMNIPEN) 2 g in sodium chloride  0.9 % 100 mL IVPB        2 g 300 mL/hr over 20 Minutes Intravenous Every 8 hours 12/31/23 1623     12/30/23 0930  sodium chloride  0.9 % with cefTRIAXone  (ROCEPHIN ) ADS Med       Note to Pharmacy: Dannielle Railing S: cabinet override      12/30/23 0930 12/30/23 1008   12/30/23 0000  ampicillin  IVPB        8 g Intravenous Every 24 hours 12/30/23 1404 02/05/24 2359   12/30/23 0000  cefTRIAXone  (ROCEPHIN ) IVPB        2 g Intravenous Every 12 hours 12/30/23 1404 02/05/24 2359   12/29/23 2000  cefTRIAXone  (ROCEPHIN ) 2 g in sodium chloride  0.9 % 100 mL IVPB        2 g 200 mL/hr over 30 Minutes Intravenous Every 12 hours 12/29/23 1826     12/29/23 1930  ampicillin  (OMNIPEN) 2 g in sodium chloride  0.9 % 100 mL IVPB  Status:  Discontinued        2 g 300 mL/hr over 20 Minutes Intravenous Every 6 hours 12/29/23 1829 12/31/23 1623   12/29/23 1800  metroNIDAZOLE  (FLAGYL ) IVPB 500 mg        500 mg 100 mL/hr over 60  Minutes Intravenous Every 12 hours 12/29/23 1737     12/29/23 1800  cefTRIAXone  (ROCEPHIN ) 2 g in sodium chloride  0.9 % 100 mL IVPB  Status:  Discontinued        2 g 200 mL/hr over 30 Minutes Intravenous Every 24 hours 12/29/23 1738 12/29/23 1826       Assessment/Plan:  Patient is an 88 year old female who is admitted to the hospital with concern for acute cholecystitis.  She is status post robotic assisted laparoscopic cholecystectomy on 12/12.   -Leukocytosis continues to slowly improve, 15.8 from 18.5.  Continue to monitor with daily labs -Continue IV antibiotics.  Patient currently receiving ampicillin , Rocephin , and Flagyl . Would recommend the patient receive 5 days of postoperative antibiotics -LFTs within normal limits -Okay for regular diet from general surgery standpoint.  Patient may eat what ever sounds appetizing to her -Continue bowel regimen with MiraLAX , Senokot-S, and Dulcolax suppositories -PRN pain control and antiemetics -Patient received breathing treatment this morning, will see if this improves her respiratory status -Appreciate hospitalist recommendations   LOS: 4 days    Irma Roulhac A Eleni Frank 01/02/2024  Note: Portions of this report may have been transcribed using voice recognition software. Every effort has been made to ensure accuracy; however, inadvertent computerized transcription errors may still be present.

## 2024-01-02 NOTE — Plan of Care (Signed)
  Problem: Clinical Measurements: Goal: Respiratory complications will improve Outcome: Progressing   Problem: Activity: Goal: Risk for activity intolerance will decrease Outcome: Progressing   Problem: Pain Managment: Goal: General experience of comfort will improve and/or be controlled Outcome: Progressing

## 2024-01-02 NOTE — Evaluation (Signed)
 Physical Therapy Evaluation Patient Details Name: Stephanie [name] MRN: [id number] DOB: 03-27-1935 Today's Date: 01/02/2024  History of Present Illness  Stephanie [name] is a 88 y.o. female s/p Robotic Assisted Laparoscopic Cholecystectomy on 12/30/23, with medical history significant for congestive heart failure, CKD 4, diabetes mellitus, COPD, asthma, TAVR, paroxysmal atrial fibrillation, recent enterococcal bacteremia.     Patient was just discharged from the hospital-hospitalized 12/6 to 12/10 for sepsis secondary to enterococcal bacteremia, source of infection was unclear, hide urine cultures grew E. coli.  TTE was without vegetation, TEE not done -due to age, pulmonary function and obesity.  She was treated presumptively for prosthetic valve endocarditis.  She was discharged home with home health to complete a 6-week course of IV antibiotic-continuous IV ampicillin  infusion and ceftriaxone  2 g daily, via PICC line.  Anticipated stop date was 02/05/2023.     She reports onset of vomiting, several episodes today.  With diffuse abdominal pain.  No diarrhea.  No blood or coffee grounds in vomitus.  Denies prior similar episodes.   Clinical Impression  Patient slightly impulsive requiring repeated verbal/tactile cueing for safety, limited to a few steps at bedside due to fatigue, able to transfer to/from commode and tolerated sitting up in chair after therapy. Patient will benefit from continued skilled physical therapy in hospital and recommended venue below to increase strength, balance, endurance for safe ADLs and gait.         If plan is discharge home, recommend the following: A lot of help with walking and/or transfers;A lot of help with bathing/dressing/bathroom;Help with stairs or ramp for entrance;Assist for transportation;Assistance with cooking/housework   Can travel by private vehicle        Equipment Recommendations None recommended by PT  Recommendations for Other Services        Functional Status Assessment Patient has had a recent decline in their functional status and demonstrates the ability to make significant improvements in function in a reasonable and predictable amount of time.     Precautions / Restrictions Precautions Precautions: Fall Recall of Precautions/Restrictions: Intact Restrictions Weight Bearing Restrictions Per Provider Order: No      Mobility  Bed Mobility Overal bed mobility: Needs Assistance Bed Mobility: Supine to Sit     Supine to sit: Min assist     General bed mobility comments: increased time, labored movement with diffiuclty scooting to EOB    Transfers Overall transfer level: Needs assistance Equipment used: Rolling walker (2 wheels) Transfers: Sit to/from Stand, Bed to chair/wheelchair/BSC Sit to Stand: Min assist   Step pivot transfers: Min assist, Mod assist       General transfer comment: requires verbal/tactile cueing for safety, labored movement    Ambulation/Gait Ambulation/Gait assistance: Mod assist Gait Distance (Feet): 5 Feet Assistive device: Rolling walker (2 wheels) Gait Pattern/deviations: Decreased step length - right, Decreased step length - left, Decreased stride length Gait velocity: slow     General Gait Details: limited to a few steps at bedside before having to sit due to c/o fatigue, poor standing balance  Stairs            Wheelchair Mobility     Tilt Bed    Modified Rankin (Stroke Patients Only)       Balance Overall balance assessment: Needs assistance Sitting-balance support: Feet supported, No upper extremity supported Sitting balance-Leahy Scale: Fair Sitting balance - Comments: fair/good seated at EOB   Standing balance support: Reliant on assistive device for balance, During functional activity, Bilateral upper  extremity supported Standing balance-Leahy Scale: Poor Standing balance comment: fair/poor using RW                              Pertinent Vitals/Pain Pain Assessment Pain Assessment: No/denies pain    Home Living Family/patient expects to be discharged to:: Private residence Living Arrangements: Children Available Help at Discharge: Family;Available 24 hours/day Type of Home: Mobile home Home Access: Stairs to enter Entrance Stairs-Rails: Doctor, General Practice of Steps: 4   Home Layout: One level Home Equipment: Cane - single point;Rollator (4 wheels)      Prior Function Prior Level of Function : Needs assist       Physical Assist : Mobility (physical);ADLs (physical) Mobility (physical): Bed mobility;Transfers;Gait;Stairs   Mobility Comments: household and short distanced community ambulation using RW PRN ADLs Comments: Independent for most household ADLS and assisted by family     Extremity/Trunk Assessment   Upper Extremity Assessment Upper Extremity Assessment: Overall WFL for tasks assessed    Lower Extremity Assessment Lower Extremity Assessment: Generalized weakness    Cervical / Trunk Assessment Cervical / Trunk Assessment: Kyphotic  Communication   Communication Communication: No apparent difficulties    Cognition Arousal: Alert Behavior During Therapy: WFL for tasks assessed/performed                             Following commands: Intact       Cueing Cueing Techniques: Verbal cues, Tactile cues     General Comments      Exercises     Assessment/Plan    PT Assessment Patient needs continued PT services  PT Problem List Decreased strength;Decreased activity tolerance;Decreased mobility;Decreased balance       PT Treatment Interventions DME instruction;Gait training;Stair training;Functional mobility training;Therapeutic activities;Balance training;Therapeutic exercise;Patient/family education    PT Goals (Current goals can be found in the Care Plan section)  Acute Rehab PT Goals Patient Stated Goal: return home with family to  assist PT Goal Formulation: With patient/family Time For Goal Achievement: 01/09/24 Potential to Achieve Goals: Good    Frequency Min 3X/week     Co-evaluation               AM-PAC PT 6 Clicks Mobility  Outcome Measure Help needed turning from your back to your side while in a flat bed without using bedrails?: None Help needed moving from lying on your back to sitting on the side of a flat bed without using bedrails?: A Little Help needed moving to and from a bed to a chair (including a wheelchair)?: A Lot Help needed standing up from a chair using your arms (e.g., wheelchair or bedside chair)?: A Lot Help needed to walk in hospital room?: A Lot Help needed climbing 3-5 steps with a railing? : A Lot 6 Click Score: 15    End of Session   Activity Tolerance: Patient tolerated treatment well;Patient limited by fatigue Patient left: in chair;with call bell/phone within reach;with family/visitor present Nurse Communication: Mobility status PT Visit Diagnosis: Unsteadiness on feet (R26.81);Other abnormalities of gait and mobility (R26.89);Muscle weakness (generalized) (M62.81)    Time: 9057-9041 PT Time Calculation (min) (ACUTE ONLY): 16 min   Charges:   PT Evaluation $PT Eval Low Complexity: 1 Low PT Treatments $Therapeutic Activity: 8-22 mins PT General Charges $$ ACUTE PT VISIT: 1 Visit         1:59 PM, 01/02/2024 Lynwood Music, MPT Physical  Therapist with [facility] 336 [phone] office [phone] mobile phone

## 2024-01-02 NOTE — Plan of Care (Signed)
°  Problem: Acute Rehab PT Goals(only PT should resolve) Goal: Pt Will Go Supine/Side To Sit Outcome: Progressing Flowsheets (Taken 01/02/2024 1400) Pt will go Supine/Side to Sit:  with minimal assist  with contact guard assist Goal: Patient Will Transfer Sit To/From Stand Outcome: Progressing Flowsheets (Taken 01/02/2024 1400) Patient will transfer sit to/from stand:  with minimal assist  with contact guard assist Goal: Pt Will Transfer Bed To Chair/Chair To Bed Outcome: Progressing Flowsheets (Taken 01/02/2024 1400) Pt will Transfer Bed to Chair/Chair to Bed:  with min assist  with contact guard assist Goal: Pt Will Ambulate Outcome: Progressing Flowsheets (Taken 01/02/2024 1400) Pt will Ambulate:  25 feet  with minimal assist  with contact guard assist  with rolling walker   2:01 PM, 01/02/2024 Lynwood Music, MPT Physical Therapist with [facility] [phone] office [phone] mobile phone

## 2024-01-02 NOTE — Progress Notes (Signed)
 PROGRESS NOTE    Stephanie [name]  FMW:[id number] DOB: March 28, 1935 DOA: 12/29/2023 PCP: [facility]   Brief Narrative:    Stephanie [name] is a 88 y.o. female with medical history significant for congestive heart failure, CKD 4, diabetes mellitus, COPD, asthma, TAVR, paroxysmal atrial fibrillation, recent enterococcal bacteremia.   Patient was just discharged from the hospital-hospitalized 12/6 to 12/10 for sepsis secondary to enterococcal bacteremia, source of infection was unclear, hide urine cultures grew E. coli.  TTE was without vegetation, TEE not done -due to age, pulmonary function and obesity.  She was treated presumptively for prosthetic valve endocarditis. She was discharged home with home health to complete a 6-week course of IV antibiotic-continuous IV ampicillin  infusion and ceftriaxone  2 g daily, via PICC line.  Anticipated stop date was 02/05/2023.   Admitted with acute cholecystitis with complaints of nausea vomiting and abdominal pain.   Underwent lap chole 12/12.  Hospital course notable for hypotension, leukocytosis and AKI  Assessment & Plan:   Principal Problem:   Acute cholecystitis Active Problems:   Hypertensive urgency   Asthma   DM (diabetes mellitus) (HCC)   S/P TAVR (transcatheter aortic valve replacement)   COPD (chronic obstructive pulmonary disease) (HCC)   Chronic heart failure with preserved ejection fraction (HFpEF) (HCC)   Paroxysmal atrial fibrillation (HCC)   Prosthetic valve endocarditis   Nausea and vomiting   Pyelonephritis  Assessment and Plan:  Acute cholecystitis-status post laparoscopic cholecystectomy 12/12 patient is with abdominal pain nausea and vomiting.   ultrasound as well as HIDA scan suggestive of acute cholecystitis. Patient underwent laparoscopic cholecystectomy 12/12, uneventful. Patient has some abdominal distention postop day 1, leukocytosis persistent at 22, bowel sounds present patient  passing stool.  Diet advanced.  Abdominal distention.  On bowel regimen.   General surgery following, Will continue to monitor, IV Zofran , analgesics as needed Leukocytosis 18K, trending down, will monitor Appreciate general surgery recommendations with plan to continue on IV antibiotics with Flagyl  through 12/17   Hypertensive urgency-blood pressures up to 219 systolic on presentation. Likely from acute illness.  Patient's antihypertensives were resumed yesterday.  Blood pressure stop day 1 was low after restarting home antihypertensives.  These are on hold at this time. Will resume gradually.   Discharge associated with blood bump in creatinine to 2.0, this remains elevated at 2.1 despite IV fluids. Likely developed ATN. Plan to check further urine studies   Urinary retention: Required In-N-Out catheterization twice overnight.  Able to void today.  We will wait for renal ultrasound to check for hydronephrosis.  Will place Foley if recurrent retention.   AKI on CKD:   Creatinine 1.1-1.2.  Creatinine bumped to 2.1 postop day 1 coinciding with hypotension.  Some urinary retention. - Renal ultrasound with no significant findings of hydronephrosis -Possible ATN, check urine electrolytes and maintain on gentle IV fluid for now -Recheck a.m. labs   Enterococcal bacteremia/presumptive prosthetic valve endocarditis-TTE was without vegetation, TTE-deferred due to age, pulmonary function and obesity.  Was to complete 6-week course of IV ampicillin  infusion, and IV ceftriaxone , anticipated stop date-02/05/2023. - Continue IV ampicillin -pharmacy to dose, and ceftriaxone    Acute pyelonephritis on imaging- was just treated for E. coli UTI during recent hospitalization.  UA on admission not suggestive of UTI. ??Imaging findings lagging behind treatment. - Will follow urine cultures - Continue IV ceftriaxone    Paroxysmal atrial fibrillation-not on anticoagulation due to history of GI bleed. -  Beta-blocker held for now due to vomiting.  [id number] for severe aortic stenosis.   Asthma/COPD-stable. - Alb nebs as needed - Home regimen  Obesity, class I - BMI 32.76    DVT prophylaxis:SCDs Code Status: Full Family Communication: Daughter at bedside 12/15 Disposition Plan:  Status is: Inpatient Remains inpatient appropriate because: Need for ongoing IV medications.   Consultants:  General Surgery  Procedures:  Laparoscopic cholecystectomy 12/12  Antimicrobials:  Anti-infectives (From admission, onward)    Start     Dose/Rate Route Frequency Ordered Stop   01/01/24 0600  ampicillin  (OMNIPEN) 2 g in sodium chloride  0.9 % 100 mL IVPB        2 g 300 mL/hr over 20 Minutes Intravenous Every 8 hours 12/31/23 1623     12/30/23 0930  sodium chloride  0.9 % with cefTRIAXone  (ROCEPHIN ) ADS Med       Note to Pharmacy: Dannielle Railing S: cabinet override      12/30/23 0930 12/30/23 1008   12/30/23 0000  ampicillin  IVPB        8 g Intravenous Every 24 hours 12/30/23 1404 02/05/24 2359   12/30/23 0000  cefTRIAXone  (ROCEPHIN ) IVPB        2 g Intravenous Every 12 hours 12/30/23 1404 02/05/24 2359   12/29/23 2000  cefTRIAXone  (ROCEPHIN ) 2 g in sodium chloride  0.9 % 100 mL IVPB        2 g 200 mL/hr over 30 Minutes Intravenous Every 12 hours 12/29/23 1826     12/29/23 1930  ampicillin  (OMNIPEN) 2 g in sodium chloride  0.9 % 100 mL IVPB  Status:  Discontinued        2 g 300 mL/hr over 20 Minutes Intravenous Every 6 hours 12/29/23 1829 12/31/23 1623   12/29/23 1800  metroNIDAZOLE  (FLAGYL ) IVPB 500 mg        500 mg 100 mL/hr over 60 Minutes Intravenous Every 12 hours 12/29/23 1737     12/29/23 1800  cefTRIAXone  (ROCEPHIN ) 2 g in sodium chloride  0.9 % 100 mL IVPB  Status:  Discontinued        2 g 200 mL/hr over 30 Minutes Intravenous Every 24 hours 12/29/23 1738 12/29/23 1826       Subjective: Patient seen and evaluated today with no new acute complaints or concerns. No acute  concerns or events noted overnight.  Objective: Vitals:   01/02/24 0938 01/02/24 1006 01/02/24 1100 01/02/24 1205  BP: (!) 175/57 (!) 165/73 (!) 133/92 (!) 150/53  Pulse: 77 89 75 79  Resp: (!) 23 (!) 25 (!) 21 20  Temp:   98.2 F (36.8 C) 97.9 F (36.6 C)  TempSrc:   Oral Oral  SpO2: 100% 94% 94% 96%  Weight:      Height:        Intake/Output Summary (Last 24 hours) at 01/02/2024 1318 Last data filed at 01/02/2024 0700 Gross per 24 hour  Intake 468.23 ml  Output 450 ml  Net 18.23 ml   Filed Weights   12/29/23 0831 12/30/23 1323  Weight: 89 kg 89.3 kg    Examination:  General exam: Appears calm and comfortable  Respiratory system: Clear to auscultation. Respiratory effort normal.  Currently on nasal cannula oxygen  Cardiovascular system: S1 & S2 heard, RRR.  Gastrointestinal system: Abdomen is soft, laparoscopic incision sites C/D/I Central nervous system: Alert and awake Extremities: No edema Skin: No significant lesions noted Psychiatry: Flat affect.    Data Reviewed: I have personally reviewed following labs and imaging studies  CBC: Recent Labs  Lab 12/29/23 0906 12/30/23 0439  12/31/23 0336 01/01/24 0619 01/02/24 0418  WBC 10.5 22.0* 22.1* 18.5* 15.8*  NEUTROABS 8.7*  --   --   --  13.4*  HGB 12.5 11.4* 10.8* 10.2* 10.1*  HCT 38.5 34.5* 34.0* 31.6* 31.9*  MCV 85.9 85.6 87.6 87.8 87.4  PLT 154 176 164 177 208   Basic Metabolic Panel: Recent Labs  Lab 12/29/23 0906 12/30/23 0439 12/31/23 0336 01/01/24 0619 01/02/24 0418  NA 134* 137 138 137 137  K 4.4 4.1 4.3 3.9 4.3  CL 97* 101 102 102 102  CO2 22 28 25 26 25   GLUCOSE 157* 109* 74 83 85  BUN 11 11 17  25* 27*  CREATININE 1.17* 1.25* 2.09* 2.29* 2.36*  CALCIUM  9.1 8.9 8.3* 8.4* 8.5*   GFR: Estimated Creatinine Clearance: 18.9 mL/min (A) (by C-G formula based on SCr of 2.36 mg/dL (H)). Liver Function Tests: Recent Labs  Lab 12/29/23 0906 12/30/23 0439 12/31/23 0336 01/01/24 0619  01/02/24 0418  AST 19 14* 18 14* 14*  ALT 6 5 6 5  <5  ALKPHOS 103 91 90 78 89  BILITOT 0.3 0.4 0.3 0.2 0.2  PROT 6.8 6.4* 5.8* 5.7* 5.7*  ALBUMIN 3.7 3.5 3.0* 2.8* 3.0*   Recent Labs  Lab 12/29/23 0906  LIPASE 29   No results for input(s): AMMONIA in the last 168 hours. Coagulation Profile: No results for input(s): INR, PROTIME in the last 168 hours. Cardiac Enzymes: No results for input(s): CKTOTAL, CKMB, CKMBINDEX, TROPONINI in the last 168 hours. BNP (last 3 results) No results for input(s): PROBNP in the last 8760 hours. HbA1C: No results for input(s): HGBA1C in the last 72 hours. CBG: Recent Labs  Lab 01/01/24 1932 01/01/24 2332 01/02/24 0333 01/02/24 0728 01/02/24 1114  GLUCAP 97 115* 100* 109* 131*   Lipid Profile: No results for input(s): CHOL, HDL, LDLCALC, TRIG, CHOLHDL, LDLDIRECT in the last 72 hours. Thyroid  Function Tests: No results for input(s): TSH, T4TOTAL, FREET4, T3FREE, THYROIDAB in the last 72 hours. Anemia Panel: No results for input(s): VITAMINB12, FOLATE, FERRITIN, TIBC, IRON, RETICCTPCT in the last 72 hours. Sepsis Labs: No results for input(s): PROCALCITON, LATICACIDVEN in the last 168 hours.  Recent Results (from the past 240 hours)  Resp panel by RT-PCR (RSV, Flu A&B, Covid) Anterior Nasal Swab     Status: None   Collection Time: 12/24/23  2:27 PM   Specimen: Anterior Nasal Swab  Result Value Ref Range Status   SARS Coronavirus 2 by RT PCR NEGATIVE NEGATIVE Final    Comment: (NOTE) SARS-CoV-2 target nucleic acids are NOT DETECTED.  The SARS-CoV-2 RNA is generally detectable in upper respiratory specimens during the acute phase of infection. The lowest concentration of SARS-CoV-2 viral copies this assay can detect is 138 copies/mL. A negative result does not preclude SARS-Cov-2 infection and should not be used as the sole basis for treatment or other patient management decisions.  A negative result may occur with  improper specimen collection/handling, submission of specimen other than nasopharyngeal swab, presence of viral mutation(s) within the areas targeted by this assay, and inadequate number of viral copies(<138 copies/mL). A negative result must be combined with clinical observations, patient history, and epidemiological information. The expected result is Negative.  Fact Sheet for Patients:  bloggercourse.com  Fact Sheet for Healthcare Providers:  seriousbroker.it  This test is no t yet approved or cleared by the [place]  FDA and  has been authorized for detection and/or diagnosis of SARS-CoV-2 by FDA under an Emergency Use Authorization (EUA).  This EUA will remain  in effect (meaning this test can be used) for the duration of the COVID-19 declaration under Section 564(b)(1) of the Act, 21 U.S.C.section 360bbb-3(b)(1), unless the authorization is terminated  or revoked sooner.       Influenza A by PCR NEGATIVE NEGATIVE Final   Influenza B by PCR NEGATIVE NEGATIVE Final    Comment: (NOTE) The Xpert Xpress SARS-CoV-2/FLU/RSV plus assay is intended as an aid in the diagnosis of influenza from Nasopharyngeal swab specimens and should not be used as a sole basis for treatment. Nasal washings and aspirates are unacceptable for Xpert Xpress SARS-CoV-2/FLU/RSV testing.  Fact Sheet for Patients: bloggercourse.com  Fact Sheet for Healthcare Providers: seriousbroker.it  This test is not yet approved or cleared by the [place]  FDA and has been authorized for detection and/or diagnosis of SARS-CoV-2 by FDA under an Emergency Use Authorization (EUA). This EUA will remain in effect (meaning this test can be used) for the duration of the COVID-19 declaration under Section 564(b)(1) of the Act, 21 U.S.C. section 360bbb-3(b)(1), unless the authorization  is terminated or revoked.     Resp Syncytial Virus by PCR NEGATIVE NEGATIVE Final    Comment: (NOTE) Fact Sheet for Patients: bloggercourse.com  Fact Sheet for Healthcare Providers: seriousbroker.it  This test is not yet approved or cleared by the [place]  FDA and has been authorized for detection and/or diagnosis of SARS-CoV-2 by FDA under an Emergency Use Authorization (EUA). This EUA will remain in effect (meaning this test can be used) for the duration of the COVID-19 declaration under Section 564(b)(1) of the Act, 21 U.S.C. section 360bbb-3(b)(1), unless the authorization is terminated or revoked.  Performed at [facility], [address], [place], [place] [address]   Culture, blood (routine x 2)     Status: Abnormal   Collection Time: 12/24/23  3:42 PM   Specimen: BLOOD LEFT ARM  Result Value Ref Range Status   Specimen Description   Final    BLOOD LEFT ARM BOTTLES DRAWN AEROBIC AND ANAEROBIC Performed at [facility], [address] [place], [place] [address]    Special Requests   Final    Blood Culture results may not be optimal due to an inadequate volume of blood received in culture bottles Performed at [facility], [address], [place], [place] [address]    Culture  Setup Time   Final    GRAM POSITIVE COCCI IN BOTH AEROBIC AND ANAEROBIC BOTTLES CRITICAL RESULT CALLED TO, READ BACK BY AND VERIFIED WITH: ANABEL,RN ON 12/25/23 AT 0430 BY PURDIE,J CRITICAL VALUE NOTED.  VALUE IS CONSISTENT WITH PREVIOUSLY REPORTED AND CALLED VALUE. Performed at [facility], [address], [place], [place] [address]    Culture (A)  Final    ENTEROCOCCUS FAECALIS SUSCEPTIBILITIES PERFORMED ON PREVIOUS CULTURE WITHIN THE LAST 5 DAYS. Performed at [facility], [address], [place], [place] [address]    Report Status 12/27/2023 FINAL  Final  Culture, blood (routine x 2)     Status: Abnormal   Collection Time:  12/24/23  4:09 PM   Specimen: Left Antecubital; Blood  Result Value Ref Range Status   Specimen Description   Final    LEFT ANTECUBITAL BOTTLES DRAWN AEROBIC AND ANAEROBIC Performed at [facility], [address] [place], [place] [address]    Special Requests   Final    Blood Culture results may not be optimal due to an inadequate volume of blood received in culture bottles Performed at [facility], [address] [place], [place] [address]    Culture  Setup Time   Final    GRAM POSITIVE COCCI IN BOTH AEROBIC AND ANAEROBIC BOTTLES CRITICAL RESULT CALLED TO, READ BACK BY AND VERIFIED WITH: ANABEL,RN ON 12/25/23 AT 0430 BY PURDIE,J CRITICAL RESULT CALLED TO, READ BACK BY AND VERIFIED WITH: RN ARMOND [id number] AT 1020 BY EC Performed at [facility], [address], [place], [place] [address]    Culture ENTEROCOCCUS FAECALIS (A)  Final   Report Status 12/27/2023 FINAL  Final   Organism ID, Bacteria ENTEROCOCCUS FAECALIS  Final      Susceptibility   Enterococcus faecalis - MIC*    AMPICILLIN  <=2 SENSITIVE Sensitive     VANCOMYCIN  1 SENSITIVE Sensitive     GENTAMICIN SYNERGY SENSITIVE Sensitive     * ENTEROCOCCUS FAECALIS  Blood Culture ID Panel (Reflexed)     Status: Abnormal   Collection Time: 12/24/23  4:09 PM  Result Value Ref Range Status   Enterococcus faecalis DETECTED (A) NOT DETECTED Final    Comment: CRITICAL RESULT CALLED TO, READ BACK BY AND VERIFIED WITH: RN ARMOND [id number] AT 1020 BY EC    Enterococcus Faecium NOT DETECTED NOT DETECTED Final   Listeria monocytogenes NOT DETECTED NOT DETECTED Final   Staphylococcus species NOT DETECTED NOT DETECTED Final   Staphylococcus aureus (BCID) NOT DETECTED NOT DETECTED Final   Staphylococcus epidermidis NOT DETECTED NOT DETECTED Final   Staphylococcus lugdunensis NOT DETECTED NOT DETECTED Final   Streptococcus species NOT DETECTED NOT DETECTED Final   Streptococcus agalactiae NOT DETECTED NOT DETECTED Final    Streptococcus pneumoniae NOT DETECTED NOT DETECTED Final   Streptococcus pyogenes NOT DETECTED NOT DETECTED Final   A.calcoaceticus-baumannii NOT DETECTED NOT DETECTED Final   Bacteroides fragilis NOT DETECTED NOT DETECTED Final   Enterobacterales NOT DETECTED NOT DETECTED Final   Enterobacter cloacae complex NOT DETECTED NOT DETECTED Final   Escherichia coli NOT DETECTED NOT DETECTED Final   Klebsiella aerogenes NOT DETECTED NOT DETECTED Final   Klebsiella oxytoca NOT DETECTED NOT DETECTED Final   Klebsiella pneumoniae NOT DETECTED NOT DETECTED Final   Proteus species NOT DETECTED NOT DETECTED Final   Salmonella species NOT DETECTED NOT DETECTED Final   Serratia marcescens NOT DETECTED NOT DETECTED Final   Haemophilus influenzae NOT DETECTED NOT DETECTED Final   Neisseria meningitidis NOT DETECTED NOT DETECTED Final   Pseudomonas aeruginosa NOT DETECTED NOT DETECTED Final   Stenotrophomonas maltophilia NOT DETECTED NOT DETECTED Final   Candida albicans NOT DETECTED NOT DETECTED Final   Candida auris NOT DETECTED NOT DETECTED Final   Candida glabrata NOT DETECTED NOT DETECTED Final   Candida krusei NOT DETECTED NOT DETECTED Final   Candida parapsilosis NOT DETECTED NOT DETECTED Final   Candida tropicalis NOT DETECTED NOT DETECTED Final   Cryptococcus neoformans/gattii NOT DETECTED NOT DETECTED Final   Vancomycin  resistance NOT DETECTED NOT DETECTED Final    Comment: Performed at [facility], [address], [place], [place] [address]  Urine Culture     Status: Abnormal   Collection Time: 12/24/23  4:25 PM   Specimen: Urine, Clean Catch  Result Value Ref Range Status   Specimen Description   Final    URINE, CLEAN CATCH Performed at [facility], [address] [place], [place] [address]    Special Requests   Final    NONE Performed at [facility], [address] [place], [place] [address]    Culture >=100,000 COLONIES/mL ESCHERICHIA  COLI (A)  Final   Report Status  12/26/2023 FINAL  Final   Organism ID, Bacteria ESCHERICHIA COLI (A)  Final      Susceptibility   Escherichia coli - MIC*    AMPICILLIN  >=32 RESISTANT Resistant     CEFAZOLIN  (URINE) Value in next row Sensitive      2 SENSITIVEThis is a modified FDA-approved test that has been validated and its performance characteristics determined by the reporting laboratory.  This laboratory is certified under the Clinical Laboratory Improvement Amendments CLIA as qualified to perform high complexity clinical laboratory testing.    CEFEPIME Value in next row Sensitive      2 SENSITIVEThis is a modified FDA-approved test that has been validated and its performance characteristics determined by the reporting laboratory.  This laboratory is certified under the Clinical Laboratory Improvement Amendments CLIA as qualified to perform high complexity clinical laboratory testing.    ERTAPENEM Value in next row Sensitive      2 SENSITIVEThis is a modified FDA-approved test that has been validated and its performance characteristics determined by the reporting laboratory.  This laboratory is certified under the Clinical Laboratory Improvement Amendments CLIA as qualified to perform high complexity clinical laboratory testing.    CEFTRIAXONE  Value in next row Sensitive      2 SENSITIVEThis is a modified FDA-approved test that has been validated and its performance characteristics determined by the reporting laboratory.  This laboratory is certified under the Clinical Laboratory Improvement Amendments CLIA as qualified to perform high complexity clinical laboratory testing.    CIPROFLOXACIN  Value in next row Intermediate      2 SENSITIVEThis is a modified FDA-approved test that has been validated and its performance characteristics determined by the reporting laboratory.  This laboratory is certified under the Clinical Laboratory Improvement Amendments CLIA as qualified to perform high complexity clinical laboratory testing.     GENTAMICIN Value in next row Sensitive      2 SENSITIVEThis is a modified FDA-approved test that has been validated and its performance characteristics determined by the reporting laboratory.  This laboratory is certified under the Clinical Laboratory Improvement Amendments CLIA as qualified to perform high complexity clinical laboratory testing.    NITROFURANTOIN Value in next row Sensitive      2 SENSITIVEThis is a modified FDA-approved test that has been validated and its performance characteristics determined by the reporting laboratory.  This laboratory is certified under the Clinical Laboratory Improvement Amendments CLIA as qualified to perform high complexity clinical laboratory testing.    TRIMETH/SULFA Value in next row Resistant      2 SENSITIVEThis is a modified FDA-approved test that has been validated and its performance characteristics determined by the reporting laboratory.  This laboratory is certified under the Clinical Laboratory Improvement Amendments CLIA as qualified to perform high complexity clinical laboratory testing.    AMPICILLIN /SULBACTAM Value in next row Sensitive      2 SENSITIVEThis is a modified FDA-approved test that has been validated and its performance characteristics determined by the reporting laboratory.  This laboratory is certified under the Clinical Laboratory Improvement Amendments CLIA as qualified to perform high complexity clinical laboratory testing.    PIP/TAZO Value in next row Sensitive      <=4 SENSITIVEThis is a modified FDA-approved test that has been validated and its performance characteristics determined by the reporting laboratory.  This laboratory is certified under the Clinical Laboratory Improvement Amendments CLIA as qualified to perform high complexity clinical laboratory testing.    MEROPENEM  Value  in next row Sensitive      <=4 SENSITIVEThis is a modified FDA-approved test that has been validated and its performance characteristics  determined by the reporting laboratory.  This laboratory is certified under the Clinical Laboratory Improvement Amendments CLIA as qualified to perform high complexity clinical laboratory testing.    * >=100,000 COLONIES/mL ESCHERICHIA COLI  Culture, blood (Routine X 2) w Reflex to ID Panel     Status: None   Collection Time: 12/25/23 10:51 AM   Specimen: BLOOD  Result Value Ref Range Status   Specimen Description BLOOD LEFT ANTECUBITAL  Final   Special Requests   Final    BOTTLES DRAWN AEROBIC AND ANAEROBIC Blood Culture adequate volume   Culture   Final    NO GROWTH 5 DAYS Performed at [facility], [address] [place], [place] [address]    Report Status 12/30/2023 FINAL  Final  Culture, blood (Routine X 2) w Reflex to ID Panel     Status: None   Collection Time: 12/25/23 10:51 AM   Specimen: BLOOD  Result Value Ref Range Status   Specimen Description BLOOD BLOOD LEFT HAND  Final   Special Requests   Final    BOTTLES DRAWN AEROBIC AND ANAEROBIC Blood Culture adequate volume   Culture   Final    NO GROWTH 5 DAYS Performed at [facility], [address], [place], [place] [address]    Report Status 12/30/2023 FINAL  Final  Culture, blood (Routine X 2) w Reflex to ID Panel     Status: None   Collection Time: 12/26/23  3:05 PM   Specimen: BLOOD  Result Value Ref Range Status   Specimen Description BLOOD BLOOD LEFT ARM AEROBIC BOTTLE ONLY  Final   Special Requests Blood Culture adequate volume  Final   Culture   Final    NO GROWTH 5 DAYS Performed at [facility], [address] [place], [place] [address]    Report Status 12/31/2023 FINAL  Final  Culture, blood (Routine X 2) w Reflex to ID Panel     Status: None   Collection Time: 12/26/23  3:05 PM   Specimen: BLOOD  Result Value Ref Range Status   Specimen Description BLOOD AEROBIC BOTTLE ONLY LFOA  Final   Special Requests Blood Culture adequate volume  Final   Culture   Final    NO GROWTH 5 DAYS Performed at [facility], [address], [place], [place] [address]    Report Status 12/31/2023 FINAL  Final  Urine Culture (for pregnant, neutropenic or urologic patients or patients with an indwelling urinary catheter)     Status: None   Collection Time: 12/29/23 12:16 PM   Specimen: Urine, Clean Catch  Result Value Ref Range Status   Specimen Description   Final    URINE, CLEAN CATCH Performed at [facility], [address] [place], [place] [address]    Special Requests   Final    NONE Performed at [facility], [address], [place], [place] [address]    Culture   Final    NO GROWTH Performed at [facility], [address] [place], [place] [address]    Report Status 12/31/2023 FINAL  Final         Radiology Studies: US  RENAL Result Date: 01/01/2024 CLINICAL DATA:  13870 Urinary retention 13870 EXAM: RENAL / URINARY TRACT ULTRASOUND COMPLETE COMPARISON:  December 29, 2023, November 23 24 FINDINGS: Right Kidney: Renal measurements: 11.2 x 4.3 x 4.0  cm = volume: 101 mL. Echogenicity within normal limits. No hydronephrosis visualized. Scattered simple and mildly complicated cysts are noted measuring up to 3.7 cm (for which no dedicated imaging follow-up is recommended). Left Kidney: Renal measurements: 10.0 x 5.1 x 5.3 cm = volume: 142 mL. Echogenicity within normal limits. No hydronephrosis visualized. There is a hypoechoic to near anechoic mass in the interpolar kidney measures 14 x 12 x 15 mm cyst, similar to 2024. Bladder: Completely decompressed and not visualized secondary to shadowing bowel gas. Other: None. IMPRESSION: 1. No hydronephrosis. 2. There is a 15 mm hypoechoic to near anechoic mass in the interpolar left kidney. This is favored to reflect a mildly complicated cyst. Recommend follow-up ultrasound in 1 year to assess for stability. Electronically Signed   By: Corean Salter M.D.   On: 01/01/2024 14:44        Scheduled Meds:  acetaminophen   1,000 mg Oral Q6H   amLODipine    2.5 mg Oral Daily   arformoterol   15 mcg Nebulization BID   And   umeclidinium bromide   1 puff Inhalation Daily   bisacodyl   10 mg Rectal Daily   carvedilol   6.25 mg Oral BID WC   Chlorhexidine  Gluconate Cloth  6 each Topical Daily   hydrALAZINE   100 mg Oral TID   insulin  aspart  0-9 Units Subcutaneous Q4H   isosorbide  mononitrate  30 mg Oral Daily   pantoprazole   40 mg Oral Daily   polyethylene glycol  17 g Oral Daily   senna-docusate  1 tablet Oral BID   Continuous Infusions:  ampicillin  (OMNIPEN) IV 2 g (01/02/24 0608)   cefTRIAXone  (ROCEPHIN )  IV 2 g (01/02/24 0937)   metronidazole  500 mg (01/02/24 0700)   promethazine  (PHENERGAN ) injection (IM or IVPB) Stopped (12/29/23 2053)     LOS: 4 days    Time spent: 55 minutes    Thelton Graca D Maree, DO [facility]  If 7PM-7AM, please contact night-coverage [email or web address] 01/02/2024, 1:18 PM

## 2024-01-03 ENCOUNTER — Inpatient Hospital Stay (HOSPITAL_COMMUNITY)

## 2024-01-03 ENCOUNTER — Telehealth: Payer: Self-pay

## 2024-01-03 LAB — CBC
HCT: 32.6 % — ABNORMAL LOW (ref 36.0–46.0)
Hemoglobin: 10.3 g/dL — ABNORMAL LOW (ref 12.0–15.0)
MCH: 27.6 pg (ref 26.0–34.0)
MCHC: 31.6 g/dL (ref 30.0–36.0)
MCV: 87.4 fL (ref 80.0–100.0)
Platelets: 213 K/uL (ref 150–400)
RBC: 3.73 MIL/uL — ABNORMAL LOW (ref 3.87–5.11)
RDW: 17.3 % — ABNORMAL HIGH (ref 11.5–15.5)
WBC: 13.9 K/uL — ABNORMAL HIGH (ref 4.0–10.5)
nRBC: 0.1 % (ref 0.0–0.2)

## 2024-01-03 LAB — COMPREHENSIVE METABOLIC PANEL WITH GFR
ALT: <5 U/L (ref 0–44)
AST: 16 U/L (ref 15–41)
Albumin: 3 g/dL — ABNORMAL LOW (ref 3.5–5.0)
Alkaline Phosphatase: 103 U/L (ref 38–126)
Anion gap: 6 (ref 5–15)
BUN: 23 mg/dL (ref 8–23)
CO2: 27 mmol/L (ref 22–32)
Calcium: 8.7 mg/dL — ABNORMAL LOW (ref 8.9–10.3)
Chloride: 104 mmol/L (ref 98–111)
Creatinine, Ser: 1.88 mg/dL — ABNORMAL HIGH (ref 0.44–1.00)
GFR, Estimated: 26 mL/min — ABNORMAL LOW (ref >60)
Glucose, Bld: 34 mg/dL — CL (ref 70–99)
Potassium: 4.5 mmol/L (ref 3.5–5.1)
Sodium: 137 mmol/L (ref 135–145)
Total Bilirubin: 0.2 mg/dL (ref 0.0–1.2)
Total Protein: 5.6 g/dL — ABNORMAL LOW (ref 6.5–8.1)

## 2024-01-03 LAB — GLUCOSE, CAPILLARY
Glucose-Capillary: 106 mg/dL — ABNORMAL HIGH (ref 70–99)
Glucose-Capillary: 116 mg/dL — ABNORMAL HIGH (ref 70–99)
Glucose-Capillary: 123 mg/dL — ABNORMAL HIGH (ref 70–99)
Glucose-Capillary: 126 mg/dL — ABNORMAL HIGH (ref 70–99)
Glucose-Capillary: 44 mg/dL — CL (ref 70–99)
Glucose-Capillary: 48 mg/dL — ABNORMAL LOW (ref 70–99)
Glucose-Capillary: 62 mg/dL — ABNORMAL LOW (ref 70–99)
Glucose-Capillary: 63 mg/dL — ABNORMAL LOW (ref 70–99)
Glucose-Capillary: 68 mg/dL — ABNORMAL LOW (ref 70–99)
Glucose-Capillary: 83 mg/dL (ref 70–99)
Glucose-Capillary: 91 mg/dL (ref 70–99)

## 2024-01-03 LAB — MAGNESIUM: Magnesium: 2.1 mg/dL (ref 1.7–2.4)

## 2024-01-03 LAB — SURGICAL PATHOLOGY

## 2024-01-03 MED ORDER — DEXTROSE-SODIUM CHLORIDE 5-0.9 % IV SOLN: INTRAVENOUS | Status: DC

## 2024-01-03 MED ORDER — ONDANSETRON HCL 4 MG/2ML IJ SOLN
4.0000 mg | Freq: Four times a day (QID) | INTRAMUSCULAR | Status: DC | PRN
  Administered 2024-01-03 – 2024-01-04 (×2): 4 mg via INTRAVENOUS
  Filled 2024-01-03: qty 2

## 2024-01-03 MED ORDER — ENSURE PLUS HIGH PROTEIN PO LIQD
237.0000 mL | Freq: Two times a day (BID) | ORAL | Status: DC
  Administered 2024-01-03 – 2024-01-04 (×3): 237 mL via ORAL

## 2024-01-03 MED ORDER — DEXTROSE 50 % IV SOLN: 50.0000 mL | Freq: Once | INTRAVENOUS | Status: DC

## 2024-01-03 MED ORDER — AMLODIPINE BESYLATE 5 MG PO TABS
5.0000 mg | ORAL_TABLET | Freq: Every day | ORAL | Status: DC
  Administered 2024-01-03 – 2024-01-04 (×2): 5 mg via ORAL
  Filled 2024-01-03 (×2): qty 1

## 2024-01-03 MED ORDER — ONDANSETRON HCL 4 MG/2ML IJ SOLN
INTRAMUSCULAR | Status: AC
  Filled 2024-01-03: qty 2

## 2024-01-03 MED ORDER — DEXTROSE 50 % IV SOLN
25.0000 g | INTRAVENOUS | Status: AC
  Administered 2024-01-03: 05:00:00 25 g via INTRAVENOUS
  Filled 2024-01-03: qty 50

## 2024-01-03 NOTE — Plan of Care (Signed)

## 2024-01-03 NOTE — Plan of Care (Signed)
   Problem: Education: Goal: Ability to describe self-care measures that may prevent or decrease complications (Diabetes Survival Skills Education) will improve Outcome: Progressing Goal: Individualized Educational Video(s) Outcome: Progressing

## 2024-01-03 NOTE — Telephone Encounter (Signed)
 Patient was scheduled 4 times and have cancelled all 4 times

## 2024-01-03 NOTE — Progress Notes (Signed)
 [facility] Progress Note  4 Days Post-Op  Subjective: Patient seen and examined.  She is resting comfortably in bed.  She is complaining of feeling nauseated when attempting to eat food.  She denies any episodes of emesis.  She states that she had a bowel movement earlier today, but denies any flatus for the last 2 days.  She complains of continued abdominal pain.  Objective: Vital signs in last 24 hours: Temp:  [97.8 F (36.6 C)-98.8 F (37.1 C)] 98.3 F (36.8 C) (12/16 1330) Pulse Rate:  [74-92] 74 (12/16 1330) Resp:  [20-28] 20 (12/16 1330) BP: (137-177)/(54-114) 151/76 (12/16 1330) SpO2:  [90 %-95 %] 94 % (12/16 1330) FiO2 (%):  [2 %] 2 % (12/16 1330) Last BM Date : 01/03/24  Intake/Output from previous day: 12/15 0701 - 12/16 0700 In: 1872.7 [P.O.:240; I.V.:853.8; IV Piggyback:779] Out: 300 [Urine:300] Intake/Output this shift: No intake/output data recorded.  General appearance: alert, cooperative, and no distress GI: Abdomen soft, moderate distention, no percussion tenderness, mild diffuse tenderness to palpation; no rigidity, guarding, rebound tenderness; laparoscopic incision sites C/D/I with Dermabond in place  Lab Results:  Recent Labs    01/02/24 0418 01/03/24 0432  WBC 15.8* 13.9*  HGB 10.1* 10.3*  HCT 31.9* 32.6*  PLT 208 213   BMET Recent Labs    01/02/24 0418 01/03/24 0432  NA 137 137  K 4.3 4.5  CL 102 104  CO2 25 27  GLUCOSE 85 34*  BUN 27* 23  CREATININE 2.36* 1.88*  CALCIUM  8.5* 8.7*   PT/INR No results for input(s): LABPROT, INR in the last 72 hours.  Studies/Results: No results found.  Anti-infectives: Anti-infectives (From admission, onward)    Start     Dose/Rate Route Frequency Ordered Stop   01/01/24 0600  ampicillin  (OMNIPEN) 2 g in sodium chloride  0.9 % 100 mL IVPB        2 g 300 mL/hr over 20 Minutes Intravenous Every 8 hours 12/31/23 1623     12/30/23 0930  sodium chloride  0.9 % with cefTRIAXone   (ROCEPHIN ) ADS Med       Note to Pharmacy: Dannielle Railing S: cabinet override      12/30/23 0930 12/30/23 1008   12/30/23 0000  ampicillin  IVPB        8 g Intravenous Every 24 hours 12/30/23 1404 02/05/24 2359   12/30/23 0000  cefTRIAXone  (ROCEPHIN ) IVPB        2 g Intravenous Every 12 hours 12/30/23 1404 02/05/24 2359   12/29/23 2000  cefTRIAXone  (ROCEPHIN ) 2 g in sodium chloride  0.9 % 100 mL IVPB        2 g 200 mL/hr over 30 Minutes Intravenous Every 12 hours 12/29/23 1826     12/29/23 1930  ampicillin  (OMNIPEN) 2 g in sodium chloride  0.9 % 100 mL IVPB  Status:  Discontinued        2 g 300 mL/hr over 20 Minutes Intravenous Every 6 hours 12/29/23 1829 12/31/23 1623   12/29/23 1800  metroNIDAZOLE  (FLAGYL ) IVPB 500 mg        500 mg 100 mL/hr over 60 Minutes Intravenous Every 12 hours 12/29/23 1737     12/29/23 1800  cefTRIAXone  (ROCEPHIN ) 2 g in sodium chloride  0.9 % 100 mL IVPB  Status:  Discontinued        2 g 200 mL/hr over 30 Minutes Intravenous Every 24 hours 12/29/23 1738 12/29/23 1826       Assessment/Plan:  Patient is an 88 year old female who  is admitted to the hospital with concern for acute cholecystitis.  She is status post robotic assisted laparoscopic cholecystectomy on 12/12.   -Leukocytosis continues to slowly improve, 13.9 from 15.8.  Continue to monitor with daily labs -Continue IV antibiotics.  Patient currently receiving ampicillin , Rocephin , and Flagyl . Would recommend the patient receive 5 days of postoperative antibiotics -LFTs within normal limits -Given patient's worsened nausea and abdominal distention, will obtain a KUB to evaluate for possible postoperative ileus.  Advised patient and her family that the ileus could be secondary to inflammation from her acute cholecystitis. -Decrease diet down to full liquids -Continue bowel regimen with MiraLAX , Senokot-S, and Dulcolax suppositories -PRN pain control and antiemetics -Encouraged ambulation -Appreciate  hospitalist recommendations   LOS: 5 days    Oreste Majeed A Thornton Dohrmann 01/03/2024  Note: Portions of this report may have been transcribed using voice recognition software. Every effort has been made to ensure accuracy; however, inadvertent computerized transcription errors may still be present.

## 2024-01-03 NOTE — Telephone Encounter (Signed)
 Pt has ov scheduled for 12/16  to review PET scan but this was never scheduled for November.  [facility] can you please schedule PET scan and then follow-up with RB to review.

## 2024-01-03 NOTE — Progress Notes (Signed)
 Pt did not sleep well through the night pt awoke twice confused and hollaring for help to the bathroom and SOB pts sats was 82 2Ls of O2 applied. Pt complained of sore throat during shift, 0400 CBG was 44, 1/2 AMp of D50 given pts cbg returned to 83. Pt complaining of Nausea, Zofran  administered.

## 2024-01-03 NOTE — Inpatient Diabetes Management (Signed)
 Inpatient Diabetes Program Recommendations  AACE/ADA: New Consensus Statement on Inpatient Glycemic Control (2015)  Target Ranges:  Prepandial:   less than 140 mg/dL      Peak postprandial:   less than 180 mg/dL (1-2 hours)      Critically ill patients:  140 - 180 mg/dL   Lab Results  Component Value Date   GLUCAP 68 (L) 01/03/2024   HGBA1C 6.5 (H) 09/26/2023    Review of Glycemic Control  Diabetes history: DM2 Outpatient Diabetes medications: None Current orders for Inpatient glycemic control: Novolog  0-9 TID  Hypoglycemia over past couple of days.  Inpatient Diabetes Program Recommendations:    Decrease Novolog  to 0-6 TID  Follow.  Thank you. Shona Brandy, RD, LDN, CDCES Inpatient Diabetes Coordinator [phone]

## 2024-01-03 NOTE — Progress Notes (Signed)
 PROGRESS NOTE    ANNORA GUDERIAN  FMW:[id number] DOB: 06-13-1935 DOA: 12/29/2023 PCP: [facility]   Brief Narrative:    Stephanie [name] is a 88 y.o. female with medical history significant for congestive heart failure, CKD 4, diabetes mellitus, COPD, asthma, TAVR, paroxysmal atrial fibrillation, recent enterococcal bacteremia.   Patient was just discharged from the hospital-hospitalized 12/6 to 12/10 for sepsis secondary to enterococcal bacteremia, source of infection was unclear, hide urine cultures grew E. coli.  TTE was without vegetation, TEE not done -due to age, pulmonary function and obesity.  She was treated presumptively for prosthetic valve endocarditis. She was discharged home with home health to complete a 6-week course of IV antibiotic-continuous IV ampicillin  infusion and ceftriaxone  2 g daily, via PICC line.  Anticipated stop date was 02/05/2023.   Admitted with acute cholecystitis with complaints of nausea vomiting and abdominal pain.   Underwent lap chole 12/12.  Hospital course notable for hypotension, leukocytosis and AKI which is improving.  Patient now noted to have some recurrent hypoglycemia and has been started on D5 NS.  General surgery would like to have patient remain on IV antibiotics to include Flagyl  at least through 12/17.  Assessment & Plan:   Principal Problem:   Acute cholecystitis Active Problems:   Hypertensive urgency   Asthma   DM (diabetes mellitus) (HCC)   S/P TAVR (transcatheter aortic valve replacement)   COPD (chronic obstructive pulmonary disease) (HCC)   Chronic heart failure with preserved ejection fraction (HFpEF) (HCC)   Paroxysmal atrial fibrillation (HCC)   Prosthetic valve endocarditis   Nausea and vomiting   Pyelonephritis  Assessment and Plan:  Acute cholecystitis-status post laparoscopic cholecystectomy 12/12 patient is with abdominal pain nausea and vomiting.   ultrasound as well as HIDA scan  suggestive of acute cholecystitis. Patient underwent laparoscopic cholecystectomy 12/12, uneventful. Patient has some abdominal distention postop day 1, leukocytosis persistent at 22, bowel sounds present patient passing stool.  Diet advanced.  Abdominal distention.  On bowel regimen.   General surgery following, Will continue to monitor, IV Zofran , analgesics as needed Leukocytosis trending down, will monitor Appreciate general surgery recommendations with plan to continue on IV antibiotics with Flagyl  through 12/17   Hypertensive urgency-blood pressures up to 219 systolic on presentation. Likely from acute illness.  Patient's antihypertensives were resumed yesterday.  Blood pressure stop day 1 was low after restarting home antihypertensives.  These are on hold at this time. Will resume gradually.   Improving with reinitiation of home medications, amlodipine  dose increased to 5 mg daily on 12/16  Hypoglycemia Monitor closely with D5 NS initiated on 12/16   Urinary retention: Required In-N-Out catheterization twice overnight.  Able to void today.  We will wait for renal ultrasound to check for hydronephrosis.  Will place Foley if recurrent retention.   AKI-improving   Creatinine 1.1-1.2.  Creatinine bumped to 2.1 postop day 1 coinciding with hypotension.  Some urinary retention. - Renal ultrasound with no significant findings of hydronephrosis - Improving with IV fluid   Enterococcal bacteremia/presumptive prosthetic valve endocarditis-TTE was without vegetation, TTE-deferred due to age, pulmonary function and obesity.  Was to complete 6-week course of IV ampicillin  infusion, and IV ceftriaxone , anticipated stop date-02/05/2023. - Continue IV ampicillin -pharmacy to dose, and ceftriaxone    Acute pyelonephritis on imaging- was just treated for E. coli UTI during recent hospitalization.  UA on admission not suggestive of UTI. ??Imaging findings lagging behind treatment. - Will follow urine  cultures - Continue  IV ceftriaxone    Paroxysmal atrial fibrillation-not on anticoagulation due to history of GI bleed. - Beta-blocker held for now due to vomiting.     [id number] for severe aortic stenosis.   Asthma/COPD-stable. - Alb nebs as needed - Home regimen  Obesity, class I - BMI 32.76    DVT prophylaxis:SCDs Code Status: Full Family Communication: Daughter at bedside 12/15 Disposition Plan:  Status is: Inpatient Remains inpatient appropriate because: Need for ongoing IV medications.   Consultants:  General Surgery  Procedures:  Laparoscopic cholecystectomy 12/12  Antimicrobials:  Anti-infectives (From admission, onward)    Start     Dose/Rate Route Frequency Ordered Stop   01/01/24 0600  ampicillin  (OMNIPEN) 2 g in sodium chloride  0.9 % 100 mL IVPB        2 g 300 mL/hr over 20 Minutes Intravenous Every 8 hours 12/31/23 1623     12/30/23 0930  sodium chloride  0.9 % with cefTRIAXone  (ROCEPHIN ) ADS Med       Note to Pharmacy: Dannielle Railing S: cabinet override      12/30/23 0930 12/30/23 1008   12/30/23 0000  ampicillin  IVPB        8 g Intravenous Every 24 hours 12/30/23 1404 02/05/24 2359   12/30/23 0000  cefTRIAXone  (ROCEPHIN ) IVPB        2 g Intravenous Every 12 hours 12/30/23 1404 02/05/24 2359   12/29/23 2000  cefTRIAXone  (ROCEPHIN ) 2 g in sodium chloride  0.9 % 100 mL IVPB        2 g 200 mL/hr over 30 Minutes Intravenous Every 12 hours 12/29/23 1826     12/29/23 1930  ampicillin  (OMNIPEN) 2 g in sodium chloride  0.9 % 100 mL IVPB  Status:  Discontinued        2 g 300 mL/hr over 20 Minutes Intravenous Every 6 hours 12/29/23 1829 12/31/23 1623   12/29/23 1800  metroNIDAZOLE  (FLAGYL ) IVPB 500 mg        500 mg 100 mL/hr over 60 Minutes Intravenous Every 12 hours 12/29/23 1737     12/29/23 1800  cefTRIAXone  (ROCEPHIN ) 2 g in sodium chloride  0.9 % 100 mL IVPB  Status:  Discontinued        2 g 200 mL/hr over 30 Minutes Intravenous Every 24 hours 12/29/23 1738  12/29/23 1826       Subjective: Patient seen and evaluated today with no new acute complaints or concerns. No acute concerns or events noted overnight.  Patient appears asymptomatic from recurrent hypoglycemia and did require some D50.  Objective: Vitals:   01/02/24 1937 01/02/24 2029 01/03/24 0430 01/03/24 0741  BP: (!) 175/62  (!) 173/94   Pulse: 76  85   Resp: (!) 22  20   Temp: 98.2 F (36.8 C)  97.8 F (36.6 C)   TempSrc: Oral  Oral   SpO2: 91% 91% 92% 95%  Weight:      Height:        Intake/Output Summary (Last 24 hours) at 01/03/2024 1239 Last data filed at 01/03/2024 9392 Gross per 24 hour  Intake 1872.73 ml  Output --  Net 1872.73 ml   Filed Weights   12/29/23 0831 12/30/23 1323  Weight: 89 kg 89.3 kg    Examination:  General exam: Appears calm and comfortable  Respiratory system: Clear to auscultation. Respiratory effort normal.  Currently on nasal cannula oxygen  Cardiovascular system: S1 & S2 heard, RRR.  Gastrointestinal system: Abdomen is soft, laparoscopic incision sites C/D/I Central nervous system: Alert and awake Extremities:  No edema Skin: No significant lesions noted Psychiatry: Flat affect.    Data Reviewed: I have personally reviewed following labs and imaging studies  CBC: Recent Labs  Lab 12/29/23 0906 12/30/23 0439 12/31/23 0336 01/01/24 0619 01/02/24 0418 01/03/24 0432  WBC 10.5 22.0* 22.1* 18.5* 15.8* 13.9*  NEUTROABS 8.7*  --   --   --  13.4*  --   HGB 12.5 11.4* 10.8* 10.2* 10.1* 10.3*  HCT 38.5 34.5* 34.0* 31.6* 31.9* 32.6*  MCV 85.9 85.6 87.6 87.8 87.4 87.4  PLT 154 176 164 177 208 213   Basic Metabolic Panel: Recent Labs  Lab 12/30/23 0439 12/31/23 0336 01/01/24 0619 01/02/24 0418 01/03/24 0432  NA 137 138 137 137 137  K 4.1 4.3 3.9 4.3 4.5  CL 101 102 102 102 104  CO2 28 25 26 25 27   GLUCOSE 109* 74 83 85 34*  BUN 11 17 25* 27* 23  CREATININE 1.25* 2.09* 2.29* 2.36* 1.88*  CALCIUM  8.9 8.3* 8.4* 8.5* 8.7*   MG  --   --   --   --  2.1   GFR: Estimated Creatinine Clearance: 23.7 mL/min (A) (by C-G formula based on SCr of 1.88 mg/dL (H)). Liver Function Tests: Recent Labs  Lab 12/30/23 0439 12/31/23 0336 01/01/24 0619 01/02/24 0418 01/03/24 0432  AST 14* 18 14* 14* 16  ALT 5 6 5  <5 <5  ALKPHOS 91 90 78 89 103  BILITOT 0.4 0.3 0.2 0.2 0.2  PROT 6.4* 5.8* 5.7* 5.7* 5.6*  ALBUMIN 3.5 3.0* 2.8* 3.0* 3.0*   Recent Labs  Lab 12/29/23 0906  LIPASE 29   No results for input(s): AMMONIA in the last 168 hours. Coagulation Profile: No results for input(s): INR, PROTIME in the last 168 hours. Cardiac Enzymes: No results for input(s): CKTOTAL, CKMB, CKMBINDEX, TROPONINI in the last 168 hours. BNP (last 3 results) No results for input(s): PROBNP in the last 8760 hours. HbA1C: No results for input(s): HGBA1C in the last 72 hours. CBG: Recent Labs  Lab 01/03/24 0442 01/03/24 0537 01/03/24 0756 01/03/24 0906 01/03/24 1204  GLUCAP 48* 83 63* 106* 68*   Lipid Profile: No results for input(s): CHOL, HDL, LDLCALC, TRIG, CHOLHDL, LDLDIRECT in the last 72 hours. Thyroid  Function Tests: No results for input(s): TSH, T4TOTAL, FREET4, T3FREE, THYROIDAB in the last 72 hours. Anemia Panel: No results for input(s): VITAMINB12, FOLATE, FERRITIN, TIBC, IRON, RETICCTPCT in the last 72 hours. Sepsis Labs: No results for input(s): PROCALCITON, LATICACIDVEN in the last 168 hours.  Recent Results (from the past 240 hours)  Resp panel by RT-PCR (RSV, Flu A&B, Covid) Anterior Nasal Swab     Status: None   Collection Time: 12/24/23  2:27 PM   Specimen: Anterior Nasal Swab  Result Value Ref Range Status   SARS Coronavirus 2 by RT PCR NEGATIVE NEGATIVE Final    Comment: (NOTE) SARS-CoV-2 target nucleic acids are NOT DETECTED.  The SARS-CoV-2 RNA is generally detectable in upper respiratory specimens during the acute phase of infection. The  lowest concentration of SARS-CoV-2 viral copies this assay can detect is 138 copies/mL. A negative result does not preclude SARS-Cov-2 infection and should not be used as the sole basis for treatment or other patient management decisions. A negative result may occur with  improper specimen collection/handling, submission of specimen other than nasopharyngeal swab, presence of viral mutation(s) within the areas targeted by this assay, and inadequate number of viral copies(<138 copies/mL). A negative result must be combined with clinical observations,  patient history, and epidemiological information. The expected result is Negative.  Fact Sheet for Patients:  bloggercourse.com  Fact Sheet for Healthcare Providers:  seriousbroker.it  This test is no t yet approved or cleared by the [place]  FDA and  has been authorized for detection and/or diagnosis of SARS-CoV-2 by FDA under an Emergency Use Authorization (EUA). This EUA will remain  in effect (meaning this test can be used) for the duration of the COVID-19 declaration under Section 564(b)(1) of the Act, 21 U.S.C.section 360bbb-3(b)(1), unless the authorization is terminated  or revoked sooner.       Influenza A by PCR NEGATIVE NEGATIVE Final   Influenza B by PCR NEGATIVE NEGATIVE Final    Comment: (NOTE) The Xpert Xpress SARS-CoV-2/FLU/RSV plus assay is intended as an aid in the diagnosis of influenza from Nasopharyngeal swab specimens and should not be used as a sole basis for treatment. Nasal washings and aspirates are unacceptable for Xpert Xpress SARS-CoV-2/FLU/RSV testing.  Fact Sheet for Patients: bloggercourse.com  Fact Sheet for Healthcare Providers: seriousbroker.it  This test is not yet approved or cleared by the [place]  FDA and has been authorized for detection and/or diagnosis of SARS-CoV-2 by FDA under  an Emergency Use Authorization (EUA). This EUA will remain in effect (meaning this test can be used) for the duration of the COVID-19 declaration under Section 564(b)(1) of the Act, 21 U.S.C. section 360bbb-3(b)(1), unless the authorization is terminated or revoked.     Resp Syncytial Virus by PCR NEGATIVE NEGATIVE Final    Comment: (NOTE) Fact Sheet for Patients: bloggercourse.com  Fact Sheet for Healthcare Providers: seriousbroker.it  This test is not yet approved or cleared by the [place]  FDA and has been authorized for detection and/or diagnosis of SARS-CoV-2 by FDA under an Emergency Use Authorization (EUA). This EUA will remain in effect (meaning this test can be used) for the duration of the COVID-19 declaration under Section 564(b)(1) of the Act, 21 U.S.C. section 360bbb-3(b)(1), unless the authorization is terminated or revoked.  Performed at [facility], [address], [place], [place] [address]   Culture, blood (routine x 2)     Status: Abnormal   Collection Time: 12/24/23  3:42 PM   Specimen: BLOOD LEFT ARM  Result Value Ref Range Status   Specimen Description   Final    BLOOD LEFT ARM BOTTLES DRAWN AEROBIC AND ANAEROBIC Performed at [facility], [address] [place], [place] [address]    Special Requests   Final    Blood Culture results may not be optimal due to an inadequate volume of blood received in culture bottles Performed at [facility], [address] [place], [place] [address]    Culture  Setup Time   Final    GRAM POSITIVE COCCI IN BOTH AEROBIC AND ANAEROBIC BOTTLES CRITICAL RESULT CALLED TO, READ BACK BY AND VERIFIED WITH: ANABEL,RN ON 12/25/23 AT 0430 BY PURDIE,J CRITICAL VALUE NOTED.  VALUE IS CONSISTENT WITH PREVIOUSLY REPORTED AND CALLED VALUE. Performed at [facility], [address], [place], [place] [address]    Culture (A)  Final    ENTEROCOCCUS FAECALIS SUSCEPTIBILITIES PERFORMED  ON PREVIOUS CULTURE WITHIN THE LAST 5 DAYS. Performed at [facility], [address], [place], [place] [address]    Report Status 12/27/2023 FINAL  Final  Culture, blood (routine x 2)     Status: Abnormal   Collection Time: 12/24/23  4:09 PM   Specimen: Left Antecubital; Blood  Result Value Ref Range Status  Specimen Description   Final    LEFT ANTECUBITAL BOTTLES DRAWN AEROBIC AND ANAEROBIC Performed at [facility], [address] [place], [place] [address]    Special Requests   Final    Blood Culture results may not be optimal due to an inadequate volume of blood received in culture bottles Performed at [facility], [address], [place], [place] [address]    Culture  Setup Time   Final    GRAM POSITIVE COCCI IN BOTH AEROBIC AND ANAEROBIC BOTTLES CRITICAL RESULT CALLED TO, READ BACK BY AND VERIFIED WITH: ANABEL,RN ON 12/25/23 AT 0430 BY PURDIE,J CRITICAL RESULT CALLED TO, READ BACK BY AND VERIFIED WITH: RN ARMOND [id number] AT 1020 BY EC Performed at [facility], [address] [place], [place] [address]    Culture ENTEROCOCCUS FAECALIS (A)  Final   Report Status 12/27/2023 FINAL  Final   Organism ID, Bacteria ENTEROCOCCUS FAECALIS  Final      Susceptibility   Enterococcus faecalis - MIC*    AMPICILLIN  <=2 SENSITIVE Sensitive     VANCOMYCIN  1 SENSITIVE Sensitive     GENTAMICIN SYNERGY SENSITIVE Sensitive     * ENTEROCOCCUS FAECALIS  Blood Culture ID Panel (Reflexed)     Status: Abnormal   Collection Time: 12/24/23  4:09 PM  Result Value Ref Range Status   Enterococcus faecalis DETECTED (A) NOT DETECTED Final    Comment: CRITICAL RESULT CALLED TO, READ BACK BY AND VERIFIED WITH: RN ARMOND [id number] AT 1020 BY EC    Enterococcus Faecium NOT DETECTED NOT DETECTED Final   Listeria monocytogenes NOT DETECTED NOT DETECTED Final   Staphylococcus species NOT DETECTED NOT DETECTED Final   Staphylococcus aureus (BCID) NOT DETECTED NOT DETECTED Final    Staphylococcus epidermidis NOT DETECTED NOT DETECTED Final   Staphylococcus lugdunensis NOT DETECTED NOT DETECTED Final   Streptococcus species NOT DETECTED NOT DETECTED Final   Streptococcus agalactiae NOT DETECTED NOT DETECTED Final   Streptococcus pneumoniae NOT DETECTED NOT DETECTED Final   Streptococcus pyogenes NOT DETECTED NOT DETECTED Final   A.calcoaceticus-baumannii NOT DETECTED NOT DETECTED Final   Bacteroides fragilis NOT DETECTED NOT DETECTED Final   Enterobacterales NOT DETECTED NOT DETECTED Final   Enterobacter cloacae complex NOT DETECTED NOT DETECTED Final   Escherichia coli NOT DETECTED NOT DETECTED Final   Klebsiella aerogenes NOT DETECTED NOT DETECTED Final   Klebsiella oxytoca NOT DETECTED NOT DETECTED Final   Klebsiella pneumoniae NOT DETECTED NOT DETECTED Final   Proteus species NOT DETECTED NOT DETECTED Final   Salmonella species NOT DETECTED NOT DETECTED Final   Serratia marcescens NOT DETECTED NOT DETECTED Final   Haemophilus influenzae NOT DETECTED NOT DETECTED Final   Neisseria meningitidis NOT DETECTED NOT DETECTED Final   Pseudomonas aeruginosa NOT DETECTED NOT DETECTED Final   Stenotrophomonas maltophilia NOT DETECTED NOT DETECTED Final   Candida albicans NOT DETECTED NOT DETECTED Final   Candida auris NOT DETECTED NOT DETECTED Final   Candida glabrata NOT DETECTED NOT DETECTED Final   Candida krusei NOT DETECTED NOT DETECTED Final   Candida parapsilosis NOT DETECTED NOT DETECTED Final   Candida tropicalis NOT DETECTED NOT DETECTED Final   Cryptococcus neoformans/gattii NOT DETECTED NOT DETECTED Final   Vancomycin  resistance NOT DETECTED NOT DETECTED Final    Comment: Performed at [facility], [address] [place], [place] [address]  Urine Culture     Status: Abnormal   Collection Time: 12/24/23  4:25 PM   Specimen: Urine, Clean Catch  Result  Value Ref Range Status   Specimen Description   Final    URINE, CLEAN CATCH Performed at [facility], [address], [place], [place] [address]    Special Requests   Final    NONE Performed at [facility], [address], [place], [place] [address]    Culture >=100,000 COLONIES/mL ESCHERICHIA COLI (A)  Final   Report Status 12/26/2023 FINAL  Final   Organism ID, Bacteria ESCHERICHIA COLI (A)  Final      Susceptibility   Escherichia coli - MIC*    AMPICILLIN  >=32 RESISTANT Resistant     CEFAZOLIN  (URINE) Value in next row Sensitive      2 SENSITIVEThis is a modified FDA-approved test that has been validated and its performance characteristics determined by the reporting laboratory.  This laboratory is certified under the Clinical Laboratory Improvement Amendments CLIA as qualified to perform high complexity clinical laboratory testing.    CEFEPIME Value in next row Sensitive      2 SENSITIVEThis is a modified FDA-approved test that has been validated and its performance characteristics determined by the reporting laboratory.  This laboratory is certified under the Clinical Laboratory Improvement Amendments CLIA as qualified to perform high complexity clinical laboratory testing.    ERTAPENEM Value in next row Sensitive      2 SENSITIVEThis is a modified FDA-approved test that has been validated and its performance characteristics determined by the reporting laboratory.  This laboratory is certified under the Clinical Laboratory Improvement Amendments CLIA as qualified to perform high complexity clinical laboratory testing.    CEFTRIAXONE  Value in next row Sensitive      2 SENSITIVEThis is a modified FDA-approved test that has been validated and its performance characteristics determined by the reporting laboratory.  This laboratory is certified under the Clinical Laboratory Improvement Amendments CLIA as qualified to perform high complexity clinical laboratory testing.    CIPROFLOXACIN  Value in next row Intermediate      2 SENSITIVEThis is a modified FDA-approved test that has been validated  and its performance characteristics determined by the reporting laboratory.  This laboratory is certified under the Clinical Laboratory Improvement Amendments CLIA as qualified to perform high complexity clinical laboratory testing.    GENTAMICIN Value in next row Sensitive      2 SENSITIVEThis is a modified FDA-approved test that has been validated and its performance characteristics determined by the reporting laboratory.  This laboratory is certified under the Clinical Laboratory Improvement Amendments CLIA as qualified to perform high complexity clinical laboratory testing.    NITROFURANTOIN Value in next row Sensitive      2 SENSITIVEThis is a modified FDA-approved test that has been validated and its performance characteristics determined by the reporting laboratory.  This laboratory is certified under the Clinical Laboratory Improvement Amendments CLIA as qualified to perform high complexity clinical laboratory testing.    TRIMETH/SULFA Value in next row Resistant      2 SENSITIVEThis is a modified FDA-approved test that has been validated and its performance characteristics determined by the reporting laboratory.  This laboratory is certified under the Clinical Laboratory Improvement Amendments CLIA as qualified to perform high complexity clinical laboratory testing.    AMPICILLIN /SULBACTAM Value in next row Sensitive      2 SENSITIVEThis is a modified FDA-approved test that has been validated and its performance characteristics determined by the reporting laboratory.  This laboratory is certified under the Clinical Laboratory Improvement Amendments CLIA as qualified to perform high complexity clinical laboratory testing.  PIP/TAZO Value in next row Sensitive      <=4 SENSITIVEThis is a modified FDA-approved test that has been validated and its performance characteristics determined by the reporting laboratory.  This laboratory is certified under the Clinical Laboratory Improvement Amendments  CLIA as qualified to perform high complexity clinical laboratory testing.    MEROPENEM  Value in next row Sensitive      <=4 SENSITIVEThis is a modified FDA-approved test that has been validated and its performance characteristics determined by the reporting laboratory.  This laboratory is certified under the Clinical Laboratory Improvement Amendments CLIA as qualified to perform high complexity clinical laboratory testing.    * >=100,000 COLONIES/mL ESCHERICHIA COLI  Culture, blood (Routine X 2) w Reflex to ID Panel     Status: None   Collection Time: 12/25/23 10:51 AM   Specimen: BLOOD  Result Value Ref Range Status   Specimen Description BLOOD LEFT ANTECUBITAL  Final   Special Requests   Final    BOTTLES DRAWN AEROBIC AND ANAEROBIC Blood Culture adequate volume   Culture   Final    NO GROWTH 5 DAYS Performed at [facility], [address], [place], [place] [address]    Report Status 12/30/2023 FINAL  Final  Culture, blood (Routine X 2) w Reflex to ID Panel     Status: None   Collection Time: 12/25/23 10:51 AM   Specimen: BLOOD  Result Value Ref Range Status   Specimen Description BLOOD BLOOD LEFT HAND  Final   Special Requests   Final    BOTTLES DRAWN AEROBIC AND ANAEROBIC Blood Culture adequate volume   Culture   Final    NO GROWTH 5 DAYS Performed at [facility] [address], [place], [place] [address]    Report Status 12/30/2023 FINAL  Final  Culture, blood (Routine X 2) w Reflex to ID Panel     Status: None   Collection Time: 12/26/23  3:05 PM   Specimen: BLOOD  Result Value Ref Range Status   Specimen Description BLOOD BLOOD LEFT ARM AEROBIC BOTTLE ONLY  Final   Special Requests Blood Culture adequate volume  Final   Culture   Final    NO GROWTH 5 DAYS Performed at [facility], [address], [place], [place] [address]    Report Status 12/31/2023 FINAL  Final  Culture, blood (Routine X 2) w Reflex to ID Panel     Status: None   Collection Time: 12/26/23  3:05 PM    Specimen: BLOOD  Result Value Ref Range Status   Specimen Description BLOOD AEROBIC BOTTLE ONLY LFOA  Final   Special Requests Blood Culture adequate volume  Final   Culture   Final    NO GROWTH 5 DAYS Performed at [facility], [address] [place], [place] [address]    Report Status 12/31/2023 FINAL  Final  Urine Culture (for pregnant, neutropenic or urologic patients or patients with an indwelling urinary catheter)     Status: None   Collection Time: 12/29/23 12:16 PM   Specimen: Urine, Clean Catch  Result Value Ref Range Status   Specimen Description   Final    URINE, CLEAN CATCH Performed at [facility], [address] [place], [place] [address]    Special Requests   Final    NONE Performed at [facility], [address] [place], [place] [address]    Culture   Final    NO GROWTH Performed at [facility] [facility], [address] [place], [place]  [address]    Report Status 12/31/2023 FINAL  Final         Radiology Studies: No results found.       Scheduled Meds:  acetaminophen   1,000 mg Oral Q6H   amLODipine   5 mg Oral Daily   arformoterol   15 mcg Nebulization BID   And   umeclidinium bromide   1 puff Inhalation Daily   bisacodyl   10 mg Rectal Daily   carvedilol   6.25 mg Oral BID WC   Chlorhexidine  Gluconate Cloth  6 each Topical Daily   hydrALAZINE   100 mg Oral TID   insulin  aspart  0-9 Units Subcutaneous Q4H   isosorbide  mononitrate  30 mg Oral Daily   pantoprazole   40 mg Oral Daily   polyethylene glycol  17 g Oral Daily   senna-docusate  1 tablet Oral BID   Continuous Infusions:  ampicillin  (OMNIPEN) IV 300 mL/hr at 01/03/24 0616   cefTRIAXone  (ROCEPHIN )  IV 2 g (01/03/24 0844)   dextrose  5 % and 0.9 % NaCl     metronidazole  500 mg (01/03/24 0452)   promethazine  (PHENERGAN ) injection (IM or IVPB) Stopped (12/29/23 2053)     LOS: 5 days    Time spent: 55 minutes    Josefita Weissmann D Maree, DO [facility]  If 7PM-7AM, please contact  night-coverage [email or web address] 01/03/2024, 12:39 PM

## 2024-01-04 ENCOUNTER — Ambulatory Visit: Admitting: Emergency Medicine

## 2024-01-04 ENCOUNTER — Ambulatory Visit

## 2024-01-04 LAB — GLUCOSE, CAPILLARY
Glucose-Capillary: 109 mg/dL — ABNORMAL HIGH (ref 70–99)
Glucose-Capillary: 122 mg/dL — ABNORMAL HIGH (ref 70–99)
Glucose-Capillary: 166 mg/dL — ABNORMAL HIGH (ref 70–99)

## 2024-01-04 LAB — CBC
HCT: 31.1 % — ABNORMAL LOW (ref 36.0–46.0)
Hemoglobin: 10 g/dL — ABNORMAL LOW (ref 12.0–15.0)
MCH: 28 pg (ref 26.0–34.0)
MCHC: 32.2 g/dL (ref 30.0–36.0)
MCV: 87.1 fL (ref 80.0–100.0)
Platelets: 220 K/uL (ref 150–400)
RBC: 3.57 MIL/uL — ABNORMAL LOW (ref 3.87–5.11)
RDW: 17.4 % — ABNORMAL HIGH (ref 11.5–15.5)
WBC: 11.9 K/uL — ABNORMAL HIGH (ref 4.0–10.5)
nRBC: 0 % (ref 0.0–0.2)

## 2024-01-04 LAB — BASIC METABOLIC PANEL WITH GFR
Anion gap: 6 (ref 5–15)
BUN: 20 mg/dL (ref 8–23)
CO2: 26 mmol/L (ref 22–32)
Calcium: 8.6 mg/dL — ABNORMAL LOW (ref 8.9–10.3)
Chloride: 105 mmol/L (ref 98–111)
Creatinine, Ser: 1.62 mg/dL — ABNORMAL HIGH (ref 0.44–1.00)
GFR, Estimated: 31 mL/min — ABNORMAL LOW (ref >60)
Glucose, Bld: 114 mg/dL — ABNORMAL HIGH (ref 70–99)
Potassium: 4.5 mmol/L (ref 3.5–5.1)
Sodium: 136 mmol/L (ref 135–145)

## 2024-01-04 LAB — MAGNESIUM: Magnesium: 1.9 mg/dL (ref 1.7–2.4)

## 2024-01-04 MED ORDER — NYSTATIN 100000 UNIT/GM EX POWD: Freq: Three times a day (TID) | CUTANEOUS | 0 refills | Status: DC | PRN

## 2024-01-04 MED ORDER — ENSURE PLUS HIGH PROTEIN PO LIQD: 237.0000 mL | Freq: Two times a day (BID) | ORAL | 1 refills | Status: DC

## 2024-01-04 MED ORDER — AMLODIPINE BESYLATE 5 MG PO TABS: 5.0000 mg | ORAL_TABLET | Freq: Every day | ORAL | 2 refills | Status: DC

## 2024-01-04 MED ORDER — OXYCODONE HCL 5 MG PO TABS: 5.0000 mg | ORAL_TABLET | Freq: Four times a day (QID) | ORAL | 0 refills | Status: DC | PRN

## 2024-01-04 MED ORDER — POLYETHYLENE GLYCOL 3350 17 G PO PACK: 17.0000 g | PACK | Freq: Every day | ORAL | 0 refills | Status: DC | PRN

## 2024-01-04 MED ORDER — ACETAMINOPHEN 325 MG PO TABS: 650.0000 mg | ORAL_TABLET | Freq: Four times a day (QID) | ORAL | Status: DC | PRN

## 2024-01-04 NOTE — TOC Transition Note (Signed)
 Transition of Care [facility]) - Discharge Note   Patient Details  Name: Stephanie [name] MRN: [id number] Date of Birth: September 04, 1935  Transition of [facility]) CM/SW Contact:  Lucie Lunger, LCSWA Phone Number: 01/04/2024, 12:23 PM  Clinical Narrative:    CSW updated that pt is medically stable for D/C home today. CSW spoke to [facility] with [employer] who confirms they are ready for pt to return home and have all IV home antibiotic needs arranged. CSW updated [facility] rep Artavia who confirms they will follow pt in the community. MD placed [facility] PT/RN orders. TOC signing off.   Final next level of care: Home w Home Health Services Barriers to Discharge: Barriers Resolved   Patient Goals and CMS Choice Patient states their goals for this hospitalization and ongoing recovery are:: return home CMS Medicare.gov Compare Post Acute Care list provided to:: Patient Choice offered to / list presented to : Patient      Discharge Placement                       Discharge Plan and Services Additional resources added to the After Visit Summary for   In-house Referral: Clinical Social Work Discharge Planning Services: NA Post Acute Care Choice: Resumption of Svcs/PTA Provider, Home Health          DME Arranged: IV pump/equipment   Date DME Agency Contacted: 01/04/24   Representative spoke with at DME Agency: [facility] Arranged: RN, PT HH Agency: [facility] Date HH Agency Contacted: 01/04/24   Representative spoke with at [facility] Agency: [facility]  Social Drivers of Health (SDOH) Interventions SDOH Screenings   Food Insecurity: No Food Insecurity (12/30/2023)  Housing: Low Risk (12/30/2023)  Transportation Needs: No Transportation Needs (12/30/2023)  Utilities: Not At Risk (12/30/2023)  Depression (PHQ2-9): Low Risk (03/12/2022)  Social Connections: Moderately Isolated (01/01/2024)  Tobacco Use: Medium Risk (12/30/2023)     Readmission Risk Interventions     12/30/2023   10:48 AM 12/28/2023   12:17 PM 12/26/2023    8:28 AM  Readmission Risk Prevention Plan  Transportation Screening Complete Complete Complete  Medication Review (RN Care Manager) Complete Complete Complete  PCP or Specialist appointment within 3-5 days of discharge Complete Complete   HRI or Home Care Consult Complete Complete Complete  SW Recovery Care/Counseling Consult Complete Complete Complete  Palliative Care Screening Not Applicable Not Applicable Not Applicable  Skilled Nursing Facility Not Applicable Not Applicable Not Applicable

## 2024-01-04 NOTE — Plan of Care (Signed)
   Problem: Coping: Goal: Ability to adjust to condition or change in health will improve Outcome: Progressing   Problem: Fluid Volume: Goal: Ability to maintain a balanced intake and output will improve Outcome: Progressing   Problem: Health Behavior/Discharge Planning: Goal: Ability to manage health-related needs will improve Outcome: Progressing   Problem: Nutritional: Goal: Maintenance of adequate nutrition will improve Outcome: Progressing

## 2024-01-04 NOTE — Progress Notes (Signed)
 [facility] Progress Note  5 Days Post-Op  Subjective: Patient seen and examined.  She is sitting up in the chair.  She states that she is feeling very well today.  She tolerated her breakfast without nausea and vomiting.  She confirms passing flatus and having bowel function.  Her pain is well-controlled.  Objective: Vital signs in last 24 hours: Temp:  [98.2 F (36.8 C)-98.4 F (36.9 C)] 98.2 F (36.8 C) (12/17 0408) Pulse Rate:  [68-92] 92 (12/17 0408) Resp:  [18-20] 18 (12/17 0408) BP: (151-163)/(54-93) 163/93 (12/17 0408) SpO2:  [94 %-98 %] 98 % (12/17 0832) FiO2 (%):  [2 %] 2 % (12/16 1330) Last BM Date : 01/03/24  Intake/Output from previous day: 12/16 0701 - 12/17 0700 In: 1482.5 [P.O.:240; I.V.:1042.5; IV Piggyback:200] Out: 200 [Urine:200] Intake/Output this shift: No intake/output data recorded.  General appearance: alert, cooperative, and no distress GI: Abdomen soft, nondistended, no percussion tenderness, minimal right upper quadrant tenderness to palpation; no rigidity, guarding, rebound tenderness; laparoscopic incision sites C/D/I with Dermabond in place  Lab Results:  Recent Labs    01/03/24 0432 01/04/24 0454  WBC 13.9* 11.9*  HGB 10.3* 10.0*  HCT 32.6* 31.1*  PLT 213 220   BMET Recent Labs    01/03/24 0432 01/04/24 0454  NA 137 136  K 4.5 4.5  CL 104 105  CO2 27 26  GLUCOSE 34* 114*  BUN 23 20  CREATININE 1.88* 1.62*  CALCIUM  8.7* 8.6*   PT/INR No results for input(s): LABPROT, INR in the last 72 hours.  Studies/Results: DG Abd 1 View Result Date: 01/03/2024 CLINICAL DATA:  Postoperative ileus. Status post robot assisted cholecystectomy on 12/30/2023 EXAM: DG ABDOMEN 1V COMPARISON:  Abdomen and pelvis CT dated 12/29/2023. FINDINGS: Normal bowel-gas pattern. Lumbosacral spine fixation hardware and laminectomy defects. TAVR. IMPRESSION: No radiographic evidence of ileus. Electronically Signed   By: Elspeth Bathe M.D.    On: 01/03/2024 16:05    Anti-infectives: Anti-infectives (From admission, onward)    Start     Dose/Rate Route Frequency Ordered Stop   01/01/24 0600  ampicillin  (OMNIPEN) 2 g in sodium chloride  0.9 % 100 mL IVPB        2 g 300 mL/hr over 20 Minutes Intravenous Every 8 hours 12/31/23 1623     12/30/23 0930  sodium chloride  0.9 % with cefTRIAXone  (ROCEPHIN ) ADS Med       Note to Pharmacy: Dannielle Railing S: cabinet override      12/30/23 0930 12/30/23 1008   12/30/23 0000  ampicillin  IVPB        8 g Intravenous Every 24 hours 12/30/23 1404 02/05/24 2359   12/30/23 0000  cefTRIAXone  (ROCEPHIN ) IVPB        2 g Intravenous Every 12 hours 12/30/23 1404 02/05/24 2359   12/29/23 2000  cefTRIAXone  (ROCEPHIN ) 2 g in sodium chloride  0.9 % 100 mL IVPB        2 g 200 mL/hr over 30 Minutes Intravenous Every 12 hours 12/29/23 1826     12/29/23 1930  ampicillin  (OMNIPEN) 2 g in sodium chloride  0.9 % 100 mL IVPB  Status:  Discontinued        2 g 300 mL/hr over 20 Minutes Intravenous Every 6 hours 12/29/23 1829 12/31/23 1623   12/29/23 1800  metroNIDAZOLE  (FLAGYL ) IVPB 500 mg        500 mg 100 mL/hr over 60 Minutes Intravenous Every 12 hours 12/29/23 1737     12/29/23 1800  cefTRIAXone  (  ROCEPHIN ) 2 g in sodium chloride  0.9 % 100 mL IVPB  Status:  Discontinued        2 g 200 mL/hr over 30 Minutes Intravenous Every 24 hours 12/29/23 1738 12/29/23 1826       Assessment/Plan:  Patient is an 88 year old female who is admitted to the hospital with concern for acute cholecystitis.  She is status post robotic assisted laparoscopic cholecystectomy on 12/12.   -Leukocytosis continues to slowly improve, 11.9 from 13.9.  Continue to monitor with daily labs -Continue IV antibiotics.  Patient currently receiving ampicillin , Rocephin , and Flagyl . Would recommend the patient receive 5 days of postoperative antibiotics.  Today would be last day of Flagyl  -KUB without evidence of ileus -Regular diet  ordered -Continue bowel regimen with MiraLAX , Senokot-S, and Dulcolax suppositories -PRN pain control and antiemetics -Encouraged ambulation -Appreciate hospitalist recommendations - Patient stable for discharge home from a general surgery standpoint.  She will follow-up with me on 12/30   LOS: 6 days    Milind Raether A Landree Fernholz 01/04/2024  Note: Portions of this report may have been transcribed using voice recognition software. Every effort has been made to ensure accuracy; however, inadvertent computerized transcription errors may still be present.

## 2024-01-04 NOTE — Discharge Summary (Signed)
 Physician Discharge Summary  Stephanie [name] FMW:[id number] DOB: 22-Jan-1935 DOA: 12/29/2023  PCP: [facility] Surgeon: Dr Evonnie  Admit date: 12/29/2023 Discharge date: 01/04/2024  Disposition: Home with [facility]   Recommendations for Outpatient Follow-up:  Follow up with Dr. Evonnie on 01/17/24  Follow up with [facility] as scheduled Follow up with PCP in 2 weeks  Continue home IV antibiotics thru 02/05/24 as arranged by ID  Please repeat renal US  in 1 year to follow up complex left renal cyst  Home Health: RN (Home IV antibiotics)  Discharge Condition: STABLE   CODE STATUS: STABLE  DIET:  Heart Healthy    Brief Hospitalization Summary: Please see all hospital notes, images, labs for full details of the hospitalization. Admission provider HPI:  88 y.o. female with medical history significant for congestive heart failure, CKD 4, diabetes mellitus, COPD, asthma, TAVR, paroxysmal atrial fibrillation, recent enterococcal bacteremia.   Patient was just discharged from the hospital-hospitalized 12/6 to 12/10 for sepsis secondary to enterococcal bacteremia, source of infection was unclear, hide urine cultures grew E. coli.  TTE was without vegetation, TEE not done -due to age, pulmonary function and obesity.  She was treated presumptively for prosthetic valve endocarditis. She was discharged home with home health to complete a 6-week course of IV antibiotic-continuous IV ampicillin  infusion and ceftriaxone  2 g daily, via PICC line.  Anticipated stop date was 02/05/2023.   Admitted with acute cholecystitis with complaints of nausea vomiting and abdominal pain.   Underwent lap chole 12/12.  Hospital course notable for hypotension, leukocytosis and AKI which is resolved.   General surgery would like to have patient remain on IV antibiotics to include Flagyl  at least through 12/17.  Hospital Course by listed problems addressed   Acute cholecystitis-status post  laparoscopic cholecystectomy 12/12 patient is with abdominal pain nausea and vomiting.   ultrasound as well as HIDA scan suggestive of acute cholecystitis. Patient underwent laparoscopic cholecystectomy 12/12, uneventful. Patient has some abdominal distention postop day 1, leukocytosis persistent at 22, bowel sounds present patient passing stool.  Diet advanced.  Abdominal distention.  On bowel regimen.   General surgery following, Will continue to monitor, IV Zofran , analgesics as needed Leukocytosis trending down, will monitor Appreciate general surgery recommendations, continued IV antibiotics with Flagyl  through 12/17 Per surgery ok to DC home today, follow up with Dr. Evonnie on 01/17/24.    Hypertensive urgency-RESOLVED  blood pressures up to 219 systolic on presentation. Likely from acute illness.  Patient's antihypertensives were resumed.    Improving with reinitiation of home medications, amlodipine  dose increased to 5 mg daily on 12/16   Hypoglycemia---RESOLVED  CBG (last 3)  Recent Labs    01/04/24 0403 01/04/24 0724 01/04/24 1110  GLUCAP 109* 122* 166*    Urinary retention: Required In-N-Out catheterization twice.  Able to void for last 2 days with no issues.   Renal US  with no hydronephrosis    AKI-improving   Creatinine 1.1-1.2.  Creatinine bumped to 2.1 postop day 1 coinciding with hypotension.  Some urinary retention. - Renal ultrasound with no significant findings of hydronephrosis - Improving with IV fluid   Enterococcal bacteremia/presumptive prosthetic valve endocarditis-TTE was without vegetation, TTE-deferred due to age, pulmonary function and obesity.  Was to complete 6-week course of IV ampicillin  infusion, and IV ceftriaxone , anticipated stop date-02/05/2023. - Continue IV ampicillin -pharmacy to dose, and ceftriaxone    Acute pyelonephritis on imaging- was just treated for E. coli UTI during recent hospitalization.  UA on admission  not suggestive of  UTI. Imaging findings lagging behind treatment. - Will follow urine cultures - Continue IV ceftriaxone  via PICC line and home IV antibiotics    Paroxysmal atrial fibrillation-not on anticoagulation due to history of GI bleed. - Beta-blocker resumed     TAVR-2022 for severe aortic stenosis.   Asthma/COPD-stable. - Alb nebs as needed - Home regimen   Obesity, class I - BMI 32.76  Discharge Diagnoses:  Principal Problem:   Acute cholecystitis Active Problems:   Asthma   DM (diabetes mellitus) (HCC)   S/P TAVR (transcatheter aortic valve replacement)   COPD (chronic obstructive pulmonary disease) (HCC)   Chronic heart failure with preserved ejection fraction (HFpEF) (HCC)   Paroxysmal atrial fibrillation (HCC)   Prosthetic valve endocarditis   Hypertensive urgency   Nausea and vomiting   Pyelonephritis   Discharge Instructions:  Allergies as of 01/04/2024       Reactions   Nitrofurantoin Nausea And Vomiting   Sulfa Antibiotics Swelling   Lip swelling        Medication List     STOP taking these medications    gabapentin  100 MG capsule Commonly known as: NEURONTIN        TAKE these medications    acetaminophen  325 MG tablet Commonly known as: TYLENOL  Take 2 tablets (650 mg total) by mouth every 6 (six) hours as needed for mild pain (pain score 1-3), fever or headache.   albuterol  (2.5 MG/3ML) 0.083% nebulizer solution Commonly known as: PROVENTIL  Take 3 mLs (2.5 mg total) by nebulization every 2 (two) hours as needed for wheezing or shortness of breath.   amLODipine  5 MG tablet Commonly known as: NORVASC  Take 1 tablet (5 mg total) by mouth daily. What changed:  medication strength how much to take   ampicillin  IVPB Inject 8 g into the vein daily. As a continuous infusion. Indication:  Enterococcal endocarditis First Dose: Yes Last Day of Therapy:  02/05/24 Labs - Once weekly:  CBC/D and BMP, Labs - Once weekly: ESR and CRP Method of  administration: Ambulatory Pump (Continuous Infusion) Method of administration may be changed at the discretion of home infusion pharmacist based upon assessment of the patient and/or caregiver's ability to self-administer the medication ordered. What changed:  how much to take Another medication with the same name was removed. Continue taking this medication, and follow the directions you see here.   carvedilol  6.25 MG tablet Commonly known as: COREG  Take 6.25 mg by mouth 2 (two) times daily with a meal.   cefTRIAXone  IVPB Commonly known as: ROCEPHIN  Inject 2 g into the vein every 12 (twelve) hours. Indication:  Enterococcal endocarditis First Dose: Yes Last Day of Therapy:  02/05/24 Labs - Once weekly:  CBC/D and BMP, Labs - Once weekly: ESR and CRP Method of administration: IV Push Method of administration may be changed at the discretion of home infusion pharmacist based upon assessment of the patient and/or caregiver's ability to self-administer the medication ordered.   cetirizine 10 MG tablet Commonly known as: ZYRTEC Take 10 mg by mouth at bedtime.   cinacalcet  30 MG tablet Commonly known as: SENSIPAR  Take 30 mg by mouth daily.   feeding supplement Liqd Take 237 mLs by mouth 2 (two) times daily between meals. Start taking on: January 05, 2024   FeroSul 325 (65 Fe) MG tablet Generic drug: ferrous sulfate  Take 325 mg by mouth daily.   furosemide  20 MG tablet Commonly known as: LASIX  Take 1 tablet (20 mg total) by mouth daily  as needed for edema. Shortness of breath and weight gain   GNP Vitamin D3 Extra Strength 25 MCG (1000 UT) tablet Generic drug: Cholecalciferol Take 1,000 Units by mouth daily.   hydrALAZINE  100 MG tablet Commonly known as: APRESOLINE  Take 100 mg by mouth 3 (three) times daily.   isosorbide  mononitrate 30 MG 24 hr tablet Commonly known as: IMDUR  Take 1 tablet (30 mg total) by mouth daily.   nystatin  powder Commonly known as:  MYCOSTATIN /NYSTOP  Apply topically 3 (three) times daily as needed (masd).   ondansetron  4 MG tablet Commonly known as: ZOFRAN  Take 1 tablet (4 mg total) by mouth every 6 (six) hours. As needed for nausea vomiting   oxyCODONE  5 MG immediate release tablet Commonly known as: Oxy IR/ROXICODONE  Take 1 tablet (5 mg total) by mouth every 6 (six) hours as needed for severe pain (pain score 7-10) or breakthrough pain.   pantoprazole  20 MG tablet Commonly known as: PROTONIX  Take 20 mg by mouth daily.   polyethylene glycol 17 g packet Commonly known as: MIRALAX  / GLYCOLAX  Take 17 g by mouth daily as needed for moderate constipation or mild constipation.   Stiolto Respimat  2.5-2.5 MCG/ACT Aers Generic drug: Tiotropium Bromide-Olodaterol Inhale 2 puffs into the lungs daily.        Follow-up Information     Pappayliou, Dorothyann LABOR, DO. Call.   Specialty: General Surgery Why: Call to schedule follow up on 12/30 Contact information: [address] [facility], [place] [address] [phone]         [facility]) Follow up.   Specialty: Home Health Services Why: Agency will call to arrange first home therapy and nursing visit. Contact information: [address] [place]  [address] [phone]               Allergies[1] Allergies as of 01/04/2024       Reactions   Nitrofurantoin Nausea And Vomiting   Sulfa Antibiotics Swelling   Lip swelling        Medication List     STOP taking these medications    gabapentin  100 MG capsule Commonly known as: NEURONTIN        TAKE these medications    acetaminophen  325 MG tablet Commonly known as: TYLENOL  Take 2 tablets (650 mg total) by mouth every 6 (six) hours as needed for mild pain (pain score 1-3), fever or headache.   albuterol  (2.5 MG/3ML) 0.083% nebulizer solution Commonly known as: PROVENTIL  Take 3 mLs (2.5 mg total) by nebulization every 2 (two) hours as needed for wheezing  or shortness of breath.   amLODipine  5 MG tablet Commonly known as: NORVASC  Take 1 tablet (5 mg total) by mouth daily. What changed:  medication strength how much to take   ampicillin  IVPB Inject 8 g into the vein daily. As a continuous infusion. Indication:  Enterococcal endocarditis First Dose: Yes Last Day of Therapy:  02/05/24 Labs - Once weekly:  CBC/D and BMP, Labs - Once weekly: ESR and CRP Method of administration: Ambulatory Pump (Continuous Infusion) Method of administration may be changed at the discretion of home infusion pharmacist based upon assessment of the patient and/or caregiver's ability to self-administer the medication ordered. What changed:  how much to take Another medication with the same name was removed. Continue taking this medication, and follow the directions you see here.   carvedilol  6.25 MG tablet Commonly known as: COREG  Take 6.25 mg by mouth 2 (two) times daily with a meal.   cefTRIAXone  IVPB Commonly  known as: ROCEPHIN  Inject 2 g into the vein every 12 (twelve) hours. Indication:  Enterococcal endocarditis First Dose: Yes Last Day of Therapy:  02/05/24 Labs - Once weekly:  CBC/D and BMP, Labs - Once weekly: ESR and CRP Method of administration: IV Push Method of administration may be changed at the discretion of home infusion pharmacist based upon assessment of the patient and/or caregiver's ability to self-administer the medication ordered.   cetirizine 10 MG tablet Commonly known as: ZYRTEC Take 10 mg by mouth at bedtime.   cinacalcet  30 MG tablet Commonly known as: SENSIPAR  Take 30 mg by mouth daily.   feeding supplement Liqd Take 237 mLs by mouth 2 (two) times daily between meals. Start taking on: January 05, 2024   FeroSul 325 (65 Fe) MG tablet Generic drug: ferrous sulfate  Take 325 mg by mouth daily.   furosemide  20 MG tablet Commonly known as: LASIX  Take 1 tablet (20 mg total) by mouth daily as needed for edema. Shortness  of breath and weight gain   GNP Vitamin D3 Extra Strength 25 MCG (1000 UT) tablet Generic drug: Cholecalciferol Take 1,000 Units by mouth daily.   hydrALAZINE  100 MG tablet Commonly known as: APRESOLINE  Take 100 mg by mouth 3 (three) times daily.   isosorbide  mononitrate 30 MG 24 hr tablet Commonly known as: IMDUR  Take 1 tablet (30 mg total) by mouth daily.   nystatin  powder Commonly known as: MYCOSTATIN /NYSTOP  Apply topically 3 (three) times daily as needed (masd).   ondansetron  4 MG tablet Commonly known as: ZOFRAN  Take 1 tablet (4 mg total) by mouth every 6 (six) hours. As needed for nausea vomiting   oxyCODONE  5 MG immediate release tablet Commonly known as: Oxy IR/ROXICODONE  Take 1 tablet (5 mg total) by mouth every 6 (six) hours as needed for severe pain (pain score 7-10) or breakthrough pain.   pantoprazole  20 MG tablet Commonly known as: PROTONIX  Take 20 mg by mouth daily.   polyethylene glycol 17 g packet Commonly known as: MIRALAX  / GLYCOLAX  Take 17 g by mouth daily as needed for moderate constipation or mild constipation.   Stiolto Respimat  2.5-2.5 MCG/ACT Aers Generic drug: Tiotropium Bromide-Olodaterol Inhale 2 puffs into the lungs daily.        Procedures/Studies: DG Abd 1 View Result Date: 01/03/2024 CLINICAL DATA:  Postoperative ileus. Status post robot assisted cholecystectomy on 12/30/2023 EXAM: DG ABDOMEN 1V COMPARISON:  Abdomen and pelvis CT dated 12/29/2023. FINDINGS: Normal bowel-gas pattern. Lumbosacral spine fixation hardware and laminectomy defects. TAVR. IMPRESSION: No radiographic evidence of ileus. Electronically Signed   By: Elspeth Bathe M.D.   On: 01/03/2024 16:05   US  RENAL Result Date: 01/01/2024 CLINICAL DATA:  13870 Urinary retention 13870 EXAM: RENAL / URINARY TRACT ULTRASOUND COMPLETE COMPARISON:  December 29, 2023, November 23 24 FINDINGS: Right Kidney: Renal measurements: 11.2 x 4.3 x 4.0 cm = volume: 101 mL. Echogenicity within  normal limits. No hydronephrosis visualized. Scattered simple and mildly complicated cysts are noted measuring up to 3.7 cm (for which no dedicated imaging follow-up is recommended). Left Kidney: Renal measurements: 10.0 x 5.1 x 5.3 cm = volume: 142 mL. Echogenicity within normal limits. No hydronephrosis visualized. There is a hypoechoic to near anechoic mass in the interpolar kidney measures 14 x 12 x 15 mm cyst, similar to 2024. Bladder: Completely decompressed and not visualized secondary to shadowing bowel gas. Other: None. IMPRESSION: 1. No hydronephrosis. 2. There is a 15 mm hypoechoic to near anechoic mass in the interpolar left kidney.  This is favored to reflect a mildly complicated cyst. Recommend follow-up ultrasound in 1 year to assess for stability. Electronically Signed   By: Corean Salter M.D.   On: 01/01/2024 14:44   NM Hepatobiliary Liver Func Result Date: 12/30/2023 EXAM: NM HEPATOBILLARY SCAN 12/30/2023 08:53:30 AM TECHNIQUE: RADIOPHARMACEUTICAL: 5 mCi Tc-25m mebrofenin  (CHOLETEC ) injection. Dynamic images of the abdomen and pelvis were obtained in the anterior projection for 1 hour after intravenous administration of radiopharmaceutical. The study was augmented with 3 mg of IV morphine  and imaging continued for at least 30 minutes. COMPARISON: CT abdomen and pelvis and abdomen ultrasound yesterday. CLINICAL HISTORY: 88 year old female. Abdominal pain with abnormal gallbladder on CT and ultrasound. FINDINGS: Initial good radiotracer uptake by the liver and clearance of the blood pool. Prompt radiotracer transit through the common bile duct, with excretion from the liver into the small bowel. Activity is visualized in the small bowel. Initially, no definite gallbladder activity occurred (although questionably on image 32 of the initial images - versus proximal duodenum). Following morphine  augmentation, there is rounded accumulation of radiotracer to the right of the common bile duct which  persists over the entire second phase of the exam and is suggestive of partial gallbladder filling - possibly superimposed on gallbladder sludge noted by ultrasound. IMPRESSION: 1. Patent common bile duct with prompt radiotracer excretion from the liver. 2. Probable gallbladder activity and patent cystic duct, but only visible after morphine  augmentation. Electronically signed by: Helayne Hurst MD 12/30/2023 10:05 AM EST RP Workstation: [id number]   US  Abdomen Limited RUQ (LIVER/GB) Result Date: 12/29/2023 CLINICAL DATA:  Possible gallbladder inflammation on CT today. EXAM: ULTRASOUND ABDOMEN LIMITED RIGHT UPPER QUADRANT COMPARISON:  CT abdomen/pelvis earlier today. FINDINGS: Gallbladder: Mild prominence of the gallbladder measuring 13.1 cm in length. There is moderate gallbladder sludge without evidence of cholelithiasis. Slight gallbladder wall thickening measuring 3.7 mm. Positive sonographic Murphy sign. Scant amount pericholecystic fluid. Common bile duct: Diameter: 4.6 mm. Liver: 1.6 cm cyst over the right lobe. Somewhat coarse parenchymal echogenicity compatible with a degree of steatosis. Portal vein is patent on color Doppler imaging with normal direction of blood flow towards the liver. Other: None. IMPRESSION: 1. Mild gallbladder dilatation with moderate sludge and minimal associated wall thickening. Positive sonographic Murphy sign and scant amount of pericholecystic fluid. Findings may be seen with acute cholecystitis. 2. 1.6 cm right hepatic cyst. Electronically Signed   By: Toribio Agreste M.D.   On: 12/29/2023 15:59   CT ABDOMEN PELVIS W CONTRAST Result Date: 12/29/2023 CLINICAL DATA:  Acute abdominal pain and vomiting. 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 EXAM: CT ABDOMEN AND PELVIS WITH CONTRAST TECHNIQUE: Multidetector CT imaging of the abdomen and pelvis was performed using the standard protocol following bolus administration of intravenous contrast. RADIATION DOSE REDUCTION:  This exam was performed according to the departmental dose-optimization program which includes automated exposure control, adjustment of the mA and/or kV according to patient size and/or use of iterative reconstruction technique. CONTRAST:  80mL OMNIPAQUE  IOHEXOL  300 MG/ML  SOLN COMPARISON:  12/21/2023, 11/25/2020 FINDINGS: Lower chest: Mild stable cardiomegaly. Mild calcified plaque over the descending thoracic aorta. Visualized lung bases demonstrate no acute process. Hepatobiliary: Several small liver cysts unchanged. Mild dilatation of the gallbladder with slight ill definition of the wall. No definite cholelithiasis. Biliary tree is otherwise unremarkable. Pancreas: Normal. Spleen: Normal. Adrenals/Urinary Tract: Right adrenal gland is normal. 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 Kidneys are normal in size. Several bilateral  renal cysts are present and unchanged. There is a new hypoechoic area over the mid to upper pole lateral left renal cortex measuring approximately 1.6 x 2.7 cm with a smaller similar hypoechoic area over the mid to lower pole left renal cortex. These findings were not present on previous exams as the larger upper pole hypoechoic area is new compared to 12/21/2023. Mild stranding of the left perinephric fat. Findings likely due to pyelonephritis. Ureters and bladder are unremarkable. Stomach/Bowel: Possible small sliding hiatal hernia. Stomach is otherwise unremarkable. Small bowel is normal. Significant diverticulosis throughout the sigmoid colon without active inflammation. Moderate diverticulosis over the descending colon. Subtle thin rimmed oval fatty structure adjacent the lateral aspect of the descending colon without significant change as this can be seen with the people appendagitis. Prior appendectomy. Vascular/Lymphatic: Moderate calcified plaque over the abdominal aorta which is normal in caliber. No adenopathy. Reproductive:  Uterus and bilateral adnexa are unremarkable. Other: No free peritoneal air. Musculoskeletal: Posterior fusion hardware intact from L2-S1. IMPRESSION: 1. New hypoechoic areas over the mid to upper pole and mid to lower pole left renal cortex with mild stranding of the left perinephric fat. Findings likely due to pyelonephritis. 2. Mild dilatation of the gallbladder with slight ill definition of the wall. No definite cholelithiasis. Recommend right upper quadrant ultrasound if concern for acute cholecystitis. 3. 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 4. Colonic diverticulosis without active inflammation. Findings adjacent the distal descending colon unchanged from 12/21/2023, although new since 10/18/2023 which can be seen with epiploic appendagitis. 5. Aortic atherosclerosis. Aortic Atherosclerosis (ICD10-I70.0). Electronically Signed   By: Toribio Agreste M.D.   On: 12/29/2023 13:35   CT Head Wo Contrast Result Date: 12/29/2023 EXAM: CT Head Without Intravenous Contrast. CLINICAL HISTORY: Altered mental status, nontraumatic (Ped 0-17y); severe hypertension, vomiting. TECHNIQUE: Axial computed tomography images of the head/brain without intravenous contrast. Dose reduction technique was used including one or more of the following: automated exposure control, adjustment of mA and kV according to patient size, and/or iterative reconstruction. CONTRAST: Without. COMPARISON: CT head 07/03/2021. FINDINGS: BRAIN: No acute intraparenchymal hemorrhage. No mass lesion. No CT evidence for acute territorial infarct. No midline shift or extra-axial collection. Mild chronic microvascular ischemic changes and mild age related parenchymal volume loss. 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 Atherosclerosis of the carotid siphons. VENTRICLES: No hydrocephalus. ORBITS: Bilateral lens replacements. SINUSES AND MASTOIDS: Mucosal thickening in the  left sphenoid sinus with thickening of the sinus wall suggestive of mucoperiosteal reaction compatible with chronic sinusitis. The remaining paranasal sinuses and mastoid air cells are clear. SOFT TISSUES: No significant facial or scalp soft tissue swelling evident. No radiopaque foreign body is seen. BONES: No acute skull fracture. IMPRESSION: 1. No acute findings. 2. 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 3. Mild chronic microvascular ischemic changes and mild age-related parenchymal volume loss. 4. Mucosal thickening in the left sphenoid sinus with thickening of the sinus wall and mucoperiosteal reaction compatible with chronic sinusitis. Electronically signed by: Donnice Mania MD 12/29/2023 01:03 PM EST RP Workstation: [id number]   DG Abdomen Acute W/Chest Result Date: 12/29/2023 EXAM: XR Acute Abdomen Series with XR Chest, upright and supine views of the abdomen, frontal view of the chest, 4 Views. CLINICAL HISTORY: abd pain / vomiting COMPARISON: 12/24/2023 FINDINGS: LUNGS: Vascular congestion without overt edema. Pulmonary nodule at the right base with adjacent fiducial marker as characterized by CT on  10/08/2023. No focal airspace consolidation. PLEURAL SPACES: No pleural effusion. No pneumothorax. HEART: Cardiomegaly. MEDIASTINUM: Unremarkable. PERITONEUM: The upright film shows no intraperitoneal free air. BOWEL: No gaseous bowel dilatation to suggest obstruction. BONES: Lumbar spinal fusion hardware evident. No acute osseous abnormality. IMPRESSION: 1. No radiographic evidence of bowel obstruction or perforation. 2. Cardiomegaly with pulmonary vascular congestion, without overt edema. 3. Right basilar pulmonary nodule with adjacent fiducial marker, as previously characterized on CT dated 10/08/2023. Electronically signed by: Camellia Candle MD 12/29/2023 09:39 AM EST RP Workstation: [id number]   US  EKG SITE RITE Result Date: 12/27/2023 If Site Rite image not attached, placement  could not be confirmed due to current cardiac rhythm.  ECHOCARDIOGRAM COMPLETE Result Date: 12/26/2023    ECHOCARDIOGRAM REPORT   Patient Name:   KEIRSTON SAEPHANH Date of Exam: 12/26/2023 Medical Rec #:  [id number]         Height:       65.0 in Accession #:    [id number]        Weight:       196.2 lb Date of Birth:  July 20, 1935         BSA:          1.962 m Patient Age:    88 years          BP:           145/62 mmHg Patient Gender: F                 HR:           89 bpm. Exam Location:  [facility] Procedure: 2D Echo, Cardiac Doppler and Color Doppler (Both Spectral and Color            Flow Doppler were utilized during procedure). Indications:    Bacteremia R78.81                 Post TAVR evaluation Z95.2  History:        Patient has prior history of Echocardiogram examinations, most                 recent 10/31/2023. Arrythmias:Atrial Fibrillation; Risk                 Factors:Hypertension, Diabetes and Dyslipidemia. S/P TAVR                 (transcatheter aortic valve replacement). CKD stage 3b, GFR                 30-44 ml/min (HCC).  Sonographer:    Aida Pizza RCS Referring Phys: [phone] BERNARDINO KATHEE COME IMPRESSIONS  1. Left ventricular ejection fraction, by estimation, is 60 to 65%. The left ventricle has normal function. The left ventricle has no regional wall motion abnormalities. There is severe left ventricular hypertrophy. Left ventricular diastolic parameters  are consistent with Grade I diastolic dysfunction (impaired relaxation).  2. Right ventricular systolic function is normal. The right ventricular size is normal.  3. Left atrial size was moderately dilated.  4. The mitral valve is normal in structure. No evidence of mitral valve regurgitation. No evidence of mitral stenosis. Moderate mitral annular calcification.  5. Edwards Sapien 3 Ultra THV size 26 mm is in the AV position. . The aortic valve has been repaired/replaced. Aortic valve regurgitation is not visualized. No aortic stenosis is present.   6. The inferior vena cava is normal in size with greater than 50% respiratory variability, suggesting right atrial pressure of 3 mmHg. FINDINGS  Left Ventricle: Left ventricular ejection fraction, by estimation, is 60 to 65%. The left ventricle has normal function. The left ventricle has no regional wall motion abnormalities. The left ventricular internal cavity size was normal in size. There is  severe left ventricular hypertrophy. Left ventricular diastolic parameters are consistent with Grade I diastolic dysfunction (impaired relaxation). Right Ventricle: The right ventricular size is normal. Right vetricular wall thickness was not well visualized. Right ventricular systolic function is normal. Left Atrium: Left atrial size was moderately dilated. Right Atrium: Right atrial size was normal in size. Pericardium: There is no evidence of pericardial effusion. Mitral Valve: The mitral valve is normal in structure. Moderate mitral annular calcification. No evidence of mitral valve regurgitation. No evidence of mitral valve stenosis. Tricuspid Valve: The tricuspid valve is normal in structure. Tricuspid valve regurgitation is not demonstrated. No evidence of tricuspid stenosis. Aortic Valve: Edwards Sapien 3 Ultra THV size 26 mm is in the AV position. The aortic valve has been repaired/replaced. Aortic valve regurgitation is not visualized. No aortic stenosis is present. Aortic valve mean gradient measures 9.0 mmHg. Aortic valve peak gradient measures 17.1 mmHg. Pulmonic Valve: The pulmonic valve was not well visualized. Pulmonic valve regurgitation is not visualized. No evidence of pulmonic stenosis. Aorta: The aortic root is normal in size and structure and the ascending aorta was not well visualized. Venous: The inferior vena cava is normal in size with greater than 50% respiratory variability, suggesting right atrial pressure of 3 mmHg. IAS/Shunts: No atrial level shunt detected by color flow Doppler.  LEFT  VENTRICLE PLAX 2D LVIDd:         4.40 cm LVIDs:         2.00 cm LV PW:         1.60 cm LV IVS:        1.80 cm  RIGHT VENTRICLE TAPSE (M-mode): 1.9 cm LEFT ATRIUM              Index        RIGHT ATRIUM           Index LA diam:        4.80 cm  2.45 cm/m   RA Area:     21.60 cm LA Vol (A2C):   108.0 ml 55.04 ml/m  RA Volume:   63.50 ml  32.36 ml/m LA Vol (A4C):   68.6 ml  34.96 ml/m LA Biplane Vol: 86.3 ml  43.98 ml/m  AORTIC VALVE AV Vmax:           207.00 cm/s AV Vmean:          137.200 cm/s AV VTI:            0.385 m AV Peak Grad:      17.1 mmHg AV Mean Grad:      9.0 mmHg LVOT Vmax:         101.20 cm/s LVOT Vmean:        80.750 cm/s LVOT VTI:          0.209 m LVOT/AV VTI ratio: 0.54  AORTA Ao Root diam: 3.00 cm MITRAL VALVE MV Area (PHT): 4.68 cm     SHUNTS MV Decel Time: 162 msec     Systemic VTI: 0.21 m MV E velocity: 138.00 cm/s MV A velocity: 160.00 cm/s MV E/A ratio:  0.86 Dorn Ross MD Electronically signed by Dorn Ross MD Signature Date/Time: 12/26/2023/10:49:18 AM    Final    DG Chest Portable 1 View Result Date: 12/24/2023  CLINICAL DATA:  Fever, nausea, vomiting, and weakness. EXAM: PORTABLE CHEST 1 VIEW COMPARISON:  10/31/2023, 09/24/2023. FINDINGS: The heart is enlarged and the mediastinal contour stable. There is atherosclerotic calcification of the aorta. A TAVR stent is noted. No consolidation, effusion, or pneumothorax is seen. A stable vague opacity is seen at the right lung base with associated fiducial marker. No acute osseous abnormality. IMPRESSION: Stable chest with no acute cardiopulmonary process Electronically Signed   By: Leita Birmingham M.D.   On: 12/24/2023 15:49   CT L-SPINE NO CHARGE Result Date: 12/21/2023 EXAM: CT OF THE LUMBAR SPINE WITHOUT CONTRAST 12/21/2023 11:56:47 AM TECHNIQUE: CT of the lumbar spine was performed without the administration of intravenous contrast. Multiplanar reformatted images are provided for review. Automated exposure control, iterative  reconstruction, and/or weight based adjustment of the mA/kV was utilized to reduce the radiation dose to as low as reasonably achievable. COMPARISON: Lumbar MRI 08/13/2013. CT abdomen and pelvis 10/18/2023. CT abdomen and pelvis today reported separately. CLINICAL HISTORY: 88 year old female. Lumbar pain. FINDINGS: BONES AND ALIGNMENT: Normal lumbar segmentation. Lumbar lordosis has not significantly changed since 2015. Mild levoconvex lumbar scoliosis, apex at L1-L2. Background osteopenia. Visible sacroiliac joints appear intact. Lumbar vertebral height is stable since September. No acute osseous abnormality. SOFT TISSUES: Abdomen and pelvic viscera are reported separately today. Postoperative changes to the lumbar paraspinal soft tissues with no adverse features identified. DEGENERATIVE CHANGES: L1-L2: Chronic severe disc space loss and vacuum disc. Bulky and sclerotic circumferential endplate osteophytosis. Comparatively mild posterior element hypertrophy. Evidence of epidural lipomatosis. This level appears very similar to the September CT. Multifactorial spinal stenosis is suspected to be moderate (series 16 image 32). Moderate to severe left L1 neural foraminal stenosis. L2-L3: Chronic posterior fusion. No hardware loosening. Evidence of solid posterior element arthrodesis on the right. L3-L4: Chronic posterior decompression and fusion. No hardware loosening. Solid posterior element arthrodesis. L4-L5: Chronic posterior decompression and fusion. No hardware loosening. Solid posterior element arthrodesis. L5-S1: Chronic posterior decompression and fusion. Lucency along the S1 pedicle screws suggests loosening on series 12 image 43. There is interbody gas phenomenon, and no convincing arthrodesis at this level. IMPRESSION: 1. Previous lumbar fusion / decompression L2 through the sacrum. 2. 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 3. Evidence of postoperative pseudoarthrosis at L5-S1, with loosening of the S1 pedicle screws. 4. Solid arthrodesis L2 through L4. 5. CT abdomen and pelvis today reported separately. Electronically signed by: Helayne Hurst MD 12/21/2023 12:43 PM EST RP Workstation: [id number]   CT ABDOMEN PELVIS W CONTRAST Result Date: 12/21/2023 EXAM: CT ABDOMEN AND PELVIS WITH CONTRAST 12/21/2023 11:56:47 AM TECHNIQUE: CT of the abdomen and pelvis was performed with the administration of 80 mL of iohexol  (OMNIPAQUE ) 300 MG/ML solution. Multiplanar reformatted images are provided for review. Automated exposure control, iterative reconstruction, and/or weight-based adjustment of the mA/kV was utilized to reduce the radiation dose to as low as reasonably achievable. COMPARISON: 10/18/2023 CLINICAL HISTORY: Abdominal pain, acute, nonlocalized; fall 2 weeks ago, left hip pain. FINDINGS: LOWER CHEST: No acute abnormality. LIVER: Stable hepatic cysts. GALLBLADDER AND BILE DUCTS: Gallbladder is unremarkable. No biliary ductal dilatation. SPLEEN: No acute abnormality. PANCREAS: No acute abnormality. ADRENAL GLANDS: Stable left adrenal adenoma. KIDNEYS, URETERS AND BLADDER: Stable right renal cysts. No stones in the kidneys or ureters. No hydronephrosis. No perinephric or periureteral stranding. Urinary bladder is unremarkable. Per consensus, no follow-up  is needed for simple Bosniak type 1 and 2 renal cysts, unless the patient has a malignancy history or risk factors. GI AND BOWEL: Sigmoid diverticulosis without inflammation. Status post appendectomy. Stomach demonstrates no acute abnormality. There is no bowel obstruction. PERITONEUM AND RETROPERITONEUM: No ascites. No free air. VASCULATURE: Aortic atherosclerosis. Aorta is normal in caliber. LYMPH NODES: No lymphadenopathy. REPRODUCTIVE ORGANS: No acute abnormality. BONES AND SOFT TISSUES: Status post surgical posterior fusion  extending from L2 to S1. No acute osseous abnormality. No focal soft tissue abnormality. IMPRESSION: 1. No acute findings in the abdomen or pelvis. 2. Stable hepatic cysts, benign with no follow-up recommended. 3. Stable left adrenal adenoma, benign with no follow-up recommended. 4. Stable right renal simple cysts, benign with no follow-up recommended. 5. Aortic atherosclerosis. Electronically signed by: Lynwood Seip MD 12/21/2023 12:36 PM EST RP Workstation: [id number]   DG Hip Unilat With Pelvis 2-3 Views Left Result Date: 12/21/2023 CLINICAL DATA:  Left hip pain. EXAM: DG HIP (WITH OR WITHOUT PELVIS) 2-3V LEFT COMPARISON:  None Available. FINDINGS: There is no evidence of hip fracture or dislocation. Mild osteoarthritis of both hips. No bony lesions or destruction. Intact bony pelvis. Prior lower lumbar fusion. IMPRESSION: Mild osteoarthritis of both hips. Electronically Signed   By: Marcey Moan M.D.   On: 12/21/2023 10:21     Subjective: Pt reports no more hypoglycemia and feeling well and eager to go home, she is having BMs and tolerating diet.    Discharge Exam: Vitals:   01/04/24 0830 01/04/24 0832  BP:    Pulse:    Resp:    Temp:    SpO2: 97% 98%   Vitals:   01/04/24 0030 01/04/24 0408 01/04/24 0830 01/04/24 0832  BP:  (!) 163/93    Pulse:  92    Resp:  18    Temp:  98.2 F (36.8 C)    TempSrc:  Oral    SpO2: 98% 98% 97% 98%  Weight:      Height:       General: Pt is alert, awake, not in acute distress Cardiovascular: normal S1/S2 +, no rubs, no gallops Respiratory: CTA bilaterally, no wheezing, no rhonchi Abdominal: Soft, NT, ND, bowel sounds + Extremities: no edema, no cyanosis   The results of significant diagnostics from this hospitalization (including imaging, microbiology, ancillary and laboratory) are listed below for reference.     Microbiology: Recent Results (from the past 240 hours)  Culture, blood (Routine X 2) w Reflex to ID Panel     Status: None    Collection Time: 12/26/23  3:05 PM   Specimen: BLOOD  Result Value Ref Range Status   Specimen Description BLOOD BLOOD LEFT ARM AEROBIC BOTTLE ONLY  Final   Special Requests Blood Culture adequate volume  Final   Culture   Final    NO GROWTH 5 DAYS Performed at [facility], [address], [place], [place] [address]    Report Status 12/31/2023 FINAL  Final  Culture, blood (Routine X 2) w Reflex to ID Panel     Status: None   Collection Time: 12/26/23  3:05 PM   Specimen: BLOOD  Result Value Ref Range Status   Specimen Description BLOOD AEROBIC BOTTLE ONLY LFOA  Final   Special Requests Blood Culture adequate volume  Final   Culture   Final    NO GROWTH 5 DAYS Performed at [facility], [address] [place], [place] [address]    Report Status 12/31/2023 FINAL  Final  Urine Culture (for pregnant, neutropenic or urologic patients or patients with an indwelling urinary catheter)     Status: None   Collection Time: 12/29/23 12:16 PM   Specimen: Urine, Clean Catch  Result Value Ref Range Status   Specimen Description   Final    URINE, CLEAN CATCH Performed at [facility], [address], [place], [place] [address]    Special Requests   Final    NONE Performed at [facility], [address] [place], [place] [address]    Culture   Final    NO GROWTH Performed at [facility], [address] [place], [place] [address]    Report Status 12/31/2023 FINAL  Final     Labs: BNP (last 3 results) Recent Labs    08/12/23 0907 09/03/23 1046 09/24/23 0715  BNP 363.0* 581.0* 724.0*   Basic Metabolic Panel: Recent Labs  Lab 12/31/23 0336 01/01/24 0619 01/02/24 0418 01/03/24 0432 01/04/24 0454  NA 138 137 137 137 136  K 4.3 3.9 4.3 4.5 4.5  CL 102 102 102 104 105  CO2 25 26 25 27 26   GLUCOSE 74 83 85 34* 114*  BUN 17 25* 27* 23 20  CREATININE 2.09* 2.29* 2.36* 1.88* 1.62*  CALCIUM  8.3* 8.4* 8.5* 8.7* 8.6*  MG  --   --   --  2.1 1.9   Liver Function Tests: Recent  Labs  Lab 12/30/23 0439 12/31/23 0336 01/01/24 0619 01/02/24 0418 01/03/24 0432  AST 14* 18 14* 14* 16  ALT 5 6 5  <5 <5  ALKPHOS 91 90 78 89 103  BILITOT 0.4 0.3 0.2 0.2 0.2  PROT 6.4* 5.8* 5.7* 5.7* 5.6*  ALBUMIN 3.5 3.0* 2.8* 3.0* 3.0*   Recent Labs  Lab 12/29/23 0906  LIPASE 29   No results for input(s): AMMONIA in the last 168 hours. CBC: Recent Labs  Lab 12/29/23 0906 12/30/23 0439 12/31/23 0336 01/01/24 0619 01/02/24 0418 01/03/24 0432 01/04/24 0454  WBC 10.5   < > 22.1* 18.5* 15.8* 13.9* 11.9*  NEUTROABS 8.7*  --   --   --  13.4*  --   --   HGB 12.5   < > 10.8* 10.2* 10.1* 10.3* 10.0*  HCT 38.5   < > 34.0* 31.6* 31.9* 32.6* 31.1*  MCV 85.9   < > 87.6 87.8 87.4 87.4 87.1  PLT 154   < > 164 177 208 213 220   < > = values in this interval not displayed.   Cardiac Enzymes: No results for input(s): CKTOTAL, CKMB, CKMBINDEX, TROPONINI in the last 168 hours. BNP: Invalid input(s): POCBNP CBG: Recent Labs  Lab 01/03/24 2049 01/03/24 2341 01/04/24 0403 01/04/24 0724 01/04/24 1110  GLUCAP 116* 123* 109* 122* 166*   D-Dimer No results for input(s): DDIMER in the last 72 hours. Hgb A1c No results for input(s): HGBA1C in the last 72 hours. Lipid Profile No results for input(s): CHOL, HDL, LDLCALC, TRIG, CHOLHDL, LDLDIRECT in the last 72 hours. Thyroid  function studies No results for input(s): TSH, T4TOTAL, T3FREE, THYROIDAB in the last 72 hours.  Invalid input(s): FREET3 Anemia work up No results for input(s): VITAMINB12, FOLATE, FERRITIN, TIBC, IRON, RETICCTPCT in the last 72 hours. Urinalysis    Component Value Date/Time   COLORURINE YELLOW 12/29/2023 1216   APPEARANCEUR CLEAR 12/29/2023 1216   APPEARANCEUR Hazy (A) 05/23/2020 1119   LABSPEC 1.014 12/29/2023 1216   PHURINE 5.0 12/29/2023 1216   GLUCOSEU 50 (A) 12/29/2023 1216   HGBUR NEGATIVE  12/29/2023 1216   BILIRUBINUR NEGATIVE 12/29/2023 1216    BILIRUBINUR Negative 05/23/2020 1119   KETONESUR 5 (A) 12/29/2023 1216   PROTEINUR 100 (A) 12/29/2023 1216   UROBILINOGEN 0.2 04/25/2014 0936   NITRITE NEGATIVE 12/29/2023 1216   LEUKOCYTESUR NEGATIVE 12/29/2023 1216   Sepsis Labs Recent Labs  Lab 01/01/24 0619 01/02/24 0418 01/03/24 0432 01/04/24 0454  WBC 18.5* 15.8* 13.9* 11.9*   Microbiology Recent Results (from the past 240 hours)  Culture, blood (Routine X 2) w Reflex to ID Panel     Status: None   Collection Time: 12/26/23  3:05 PM   Specimen: BLOOD  Result Value Ref Range Status   Specimen Description BLOOD BLOOD LEFT ARM AEROBIC BOTTLE ONLY  Final   Special Requests Blood Culture adequate volume  Final   Culture   Final    NO GROWTH 5 DAYS Performed at [facility], [address] [place], [place] [address]    Report Status 12/31/2023 FINAL  Final  Culture, blood (Routine X 2) w Reflex to ID Panel     Status: None   Collection Time: 12/26/23  3:05 PM   Specimen: BLOOD  Result Value Ref Range Status   Specimen Description BLOOD AEROBIC BOTTLE ONLY LFOA  Final   Special Requests Blood Culture adequate volume  Final   Culture   Final    NO GROWTH 5 DAYS Performed at [facility], [address] [place], [place] [address]    Report Status 12/31/2023 FINAL  Final  Urine Culture (for pregnant, neutropenic or urologic patients or patients with an indwelling urinary catheter)     Status: None   Collection Time: 12/29/23 12:16 PM   Specimen: Urine, Clean Catch  Result Value Ref Range Status   Specimen Description   Final    URINE, CLEAN CATCH Performed at [facility], [address] [place], [place] [address]    Special Requests   Final    NONE Performed at [facility], [address], [place], [place] [address]    Culture   Final    NO GROWTH Performed at [facility], [address] [place], [place] [address]    Report Status 12/31/2023 FINAL  Final   Time coordinating discharge: 40 mins    SIGNED:  Afton Louder, MD  [facility] 01/04/2024, 12:33 PM How to contact the [facility] Attending or Consulting provider 7A - 7P or covering provider during after hours 7P -7A, for this patient?  Check the care team in [facility] and look for a) attending/consulting [facility] provider listed and b) the [facility] team listed Log into [email or web address] and use [place]'s universal password to access. If you do not have the password, please contact the hospital operator. Locate the [facility] provider you are looking for under [facility] and page to a number that you can be directly reached. If you still have difficulty reaching the provider, please page the [facility] (Director on Call) for the Hospitalists listed on amion for assistance.     [1]  Allergies Allergen Reactions   Nitrofurantoin Nausea And Vomiting   Sulfa Antibiotics Swelling    Lip swelling

## 2024-01-04 NOTE — Plan of Care (Signed)

## 2024-01-05 NOTE — Anesthesia Postprocedure Evaluation (Signed)
 Anesthesia Post Note  Patient: Stephanie [name]  Procedure(s) Performed: CHOLECYSTECTOMY, ROBOT-ASSISTED, LAPAROSCOPIC (Abdomen)  Patient location during evaluation: Phase II Anesthesia Type: General Level of consciousness: awake Pain management: pain level controlled Vital Signs Assessment: post-procedure vital signs reviewed and stable Respiratory status: spontaneous breathing and respiratory function stable Cardiovascular status: blood pressure returned to baseline and stable Postop Assessment: no headache and no apparent nausea or vomiting Anesthetic complications: no Comments: Late entry   No notable events documented.   Last Vitals:  Vitals:   01/04/24 0830 01/04/24 0832  BP:    Pulse:    Resp:    Temp:    SpO2: 97% 98%    Last Pain:  Vitals:   01/04/24 0815  TempSrc:   PainSc: 0-No pain                 Yvonna JINNY Bosworth

## 2024-01-17 ENCOUNTER — Encounter: Admitting: Surgery

## 2024-01-19 DEATH — deceased

## 2024-01-24 ENCOUNTER — Ambulatory Visit: Payer: Self-pay | Attending: Nurse Practitioner

## 2024-01-24 ENCOUNTER — Ambulatory Visit: Payer: Self-pay

## 2024-01-24 ENCOUNTER — Encounter: Payer: Self-pay | Admitting: Nurse Practitioner

## 2024-01-30 ENCOUNTER — Ambulatory Visit: Payer: Self-pay | Admitting: Infectious Diseases

## 2024-02-01 ENCOUNTER — Ambulatory Visit: Payer: Self-pay | Admitting: Infectious Disease

## 2024-02-10 ENCOUNTER — Ambulatory Visit: Payer: Self-pay | Admitting: Internal Medicine

## 2024-05-21 ENCOUNTER — Ambulatory Visit: Payer: Self-pay | Admitting: Nurse Practitioner
